# Patient Record
Sex: Female | Born: 1950
Health system: Southern US, Community
[De-identification: ages and names within clinical notes are randomized; demographics above are authoritative.]

## PROBLEM LIST (undated history)

## (undated) DIAGNOSIS — C9000 Multiple myeloma not having achieved remission: Secondary | ICD-10-CM

## (undated) DIAGNOSIS — M5126 Other intervertebral disc displacement, lumbar region: Secondary | ICD-10-CM

## (undated) DIAGNOSIS — R112 Nausea with vomiting, unspecified: Secondary | ICD-10-CM

## (undated) DIAGNOSIS — Z9889 Other specified postprocedural states: Secondary | ICD-10-CM

## (undated) DIAGNOSIS — E119 Type 2 diabetes mellitus without complications: Secondary | ICD-10-CM

## (undated) DIAGNOSIS — H409 Unspecified glaucoma: Secondary | ICD-10-CM

## (undated) DIAGNOSIS — Z7189 Other specified counseling: Secondary | ICD-10-CM

## (undated) HISTORY — DX: Other specified counseling: Z71.89

## (undated) HISTORY — PX: CHOLECYSTECTOMY: SHX55

## (undated) HISTORY — PX: EYE SURGERY: SHX253

## (undated) HISTORY — PX: TUBAL LIGATION: SHX77

## (undated) HISTORY — DX: Multiple myeloma not having achieved remission: C90.00

---

## 2002-11-15 ENCOUNTER — Other Ambulatory Visit: Admission: RE | Admit: 2002-11-15 | Discharge: 2002-11-15 | Payer: Self-pay | Admitting: Otolaryngology

## 2014-02-20 DIAGNOSIS — H5200 Hypermetropia, unspecified eye: Secondary | ICD-10-CM | POA: Diagnosis not present

## 2014-02-20 DIAGNOSIS — H353190 Nonexudative age-related macular degeneration, unspecified eye, stage unspecified: Secondary | ICD-10-CM | POA: Diagnosis not present

## 2014-02-20 DIAGNOSIS — H524 Presbyopia: Secondary | ICD-10-CM | POA: Diagnosis not present

## 2014-02-20 DIAGNOSIS — H52229 Regular astigmatism, unspecified eye: Secondary | ICD-10-CM | POA: Diagnosis not present

## 2014-03-06 DIAGNOSIS — IMO0001 Reserved for inherently not codable concepts without codable children: Secondary | ICD-10-CM | POA: Diagnosis not present

## 2014-03-06 DIAGNOSIS — E785 Hyperlipidemia, unspecified: Secondary | ICD-10-CM | POA: Diagnosis not present

## 2014-03-06 DIAGNOSIS — I10 Essential (primary) hypertension: Secondary | ICD-10-CM | POA: Diagnosis not present

## 2014-03-13 DIAGNOSIS — IMO0001 Reserved for inherently not codable concepts without codable children: Secondary | ICD-10-CM | POA: Diagnosis not present

## 2014-03-13 DIAGNOSIS — E669 Obesity, unspecified: Secondary | ICD-10-CM | POA: Diagnosis not present

## 2014-03-13 DIAGNOSIS — I10 Essential (primary) hypertension: Secondary | ICD-10-CM | POA: Diagnosis not present

## 2014-04-24 DIAGNOSIS — R0789 Other chest pain: Secondary | ICD-10-CM | POA: Diagnosis not present

## 2014-04-24 DIAGNOSIS — R05 Cough: Secondary | ICD-10-CM | POA: Diagnosis not present

## 2014-04-24 DIAGNOSIS — R059 Cough, unspecified: Secondary | ICD-10-CM | POA: Diagnosis not present

## 2014-04-24 DIAGNOSIS — J3489 Other specified disorders of nose and nasal sinuses: Secondary | ICD-10-CM | POA: Diagnosis not present

## 2014-05-01 DIAGNOSIS — IMO0001 Reserved for inherently not codable concepts without codable children: Secondary | ICD-10-CM | POA: Diagnosis not present

## 2014-05-29 DIAGNOSIS — I10 Essential (primary) hypertension: Secondary | ICD-10-CM | POA: Diagnosis not present

## 2014-05-29 DIAGNOSIS — IMO0001 Reserved for inherently not codable concepts without codable children: Secondary | ICD-10-CM | POA: Diagnosis not present

## 2014-05-29 DIAGNOSIS — E785 Hyperlipidemia, unspecified: Secondary | ICD-10-CM | POA: Diagnosis not present

## 2014-06-05 DIAGNOSIS — M545 Low back pain, unspecified: Secondary | ICD-10-CM | POA: Diagnosis not present

## 2014-06-05 DIAGNOSIS — I10 Essential (primary) hypertension: Secondary | ICD-10-CM | POA: Diagnosis not present

## 2014-06-05 DIAGNOSIS — M25559 Pain in unspecified hip: Secondary | ICD-10-CM | POA: Diagnosis not present

## 2014-06-05 DIAGNOSIS — E669 Obesity, unspecified: Secondary | ICD-10-CM | POA: Diagnosis not present

## 2014-06-05 DIAGNOSIS — IMO0001 Reserved for inherently not codable concepts without codable children: Secondary | ICD-10-CM | POA: Diagnosis not present

## 2014-06-05 DIAGNOSIS — M76899 Other specified enthesopathies of unspecified lower limb, excluding foot: Secondary | ICD-10-CM | POA: Diagnosis not present

## 2014-06-07 DIAGNOSIS — M79609 Pain in unspecified limb: Secondary | ICD-10-CM | POA: Diagnosis not present

## 2014-06-07 DIAGNOSIS — M25559 Pain in unspecified hip: Secondary | ICD-10-CM | POA: Diagnosis not present

## 2014-06-07 DIAGNOSIS — R262 Difficulty in walking, not elsewhere classified: Secondary | ICD-10-CM | POA: Diagnosis not present

## 2014-06-12 DIAGNOSIS — M25559 Pain in unspecified hip: Secondary | ICD-10-CM | POA: Diagnosis not present

## 2014-06-12 DIAGNOSIS — M79609 Pain in unspecified limb: Secondary | ICD-10-CM | POA: Diagnosis not present

## 2014-06-12 DIAGNOSIS — R262 Difficulty in walking, not elsewhere classified: Secondary | ICD-10-CM | POA: Diagnosis not present

## 2014-06-14 DIAGNOSIS — M79609 Pain in unspecified limb: Secondary | ICD-10-CM | POA: Diagnosis not present

## 2014-06-14 DIAGNOSIS — M25559 Pain in unspecified hip: Secondary | ICD-10-CM | POA: Diagnosis not present

## 2014-06-14 DIAGNOSIS — R262 Difficulty in walking, not elsewhere classified: Secondary | ICD-10-CM | POA: Diagnosis not present

## 2014-06-16 DIAGNOSIS — M79609 Pain in unspecified limb: Secondary | ICD-10-CM | POA: Diagnosis not present

## 2014-06-16 DIAGNOSIS — M25559 Pain in unspecified hip: Secondary | ICD-10-CM | POA: Diagnosis not present

## 2014-06-16 DIAGNOSIS — R262 Difficulty in walking, not elsewhere classified: Secondary | ICD-10-CM | POA: Diagnosis not present

## 2014-06-21 DIAGNOSIS — R262 Difficulty in walking, not elsewhere classified: Secondary | ICD-10-CM | POA: Diagnosis not present

## 2014-06-21 DIAGNOSIS — M25559 Pain in unspecified hip: Secondary | ICD-10-CM | POA: Diagnosis not present

## 2014-06-21 DIAGNOSIS — M79609 Pain in unspecified limb: Secondary | ICD-10-CM | POA: Diagnosis not present

## 2014-06-21 DIAGNOSIS — M5126 Other intervertebral disc displacement, lumbar region: Secondary | ICD-10-CM

## 2014-06-21 HISTORY — DX: Other intervertebral disc displacement, lumbar region: M51.26

## 2014-06-26 DIAGNOSIS — IMO0002 Reserved for concepts with insufficient information to code with codable children: Secondary | ICD-10-CM | POA: Diagnosis not present

## 2014-06-26 DIAGNOSIS — M545 Low back pain, unspecified: Secondary | ICD-10-CM | POA: Diagnosis not present

## 2014-06-28 DIAGNOSIS — Z87891 Personal history of nicotine dependence: Secondary | ICD-10-CM | POA: Diagnosis not present

## 2014-06-28 DIAGNOSIS — IMO0001 Reserved for inherently not codable concepts without codable children: Secondary | ICD-10-CM | POA: Diagnosis not present

## 2014-06-28 DIAGNOSIS — E785 Hyperlipidemia, unspecified: Secondary | ICD-10-CM | POA: Diagnosis not present

## 2014-06-28 DIAGNOSIS — I10 Essential (primary) hypertension: Secondary | ICD-10-CM | POA: Diagnosis not present

## 2014-06-29 DIAGNOSIS — R262 Difficulty in walking, not elsewhere classified: Secondary | ICD-10-CM | POA: Diagnosis not present

## 2014-06-29 DIAGNOSIS — M25559 Pain in unspecified hip: Secondary | ICD-10-CM | POA: Diagnosis not present

## 2014-06-29 DIAGNOSIS — M79609 Pain in unspecified limb: Secondary | ICD-10-CM | POA: Diagnosis not present

## 2014-07-02 ENCOUNTER — Emergency Department (HOSPITAL_COMMUNITY)
Admission: EM | Admit: 2014-07-02 | Discharge: 2014-07-02 | Disposition: A | Discharge status: Home / Self Care | Payer: Medicare Other | Attending: Emergency Medicine | Admitting: Emergency Medicine

## 2014-07-02 ENCOUNTER — Encounter (HOSPITAL_COMMUNITY): Payer: Self-pay | Admitting: Emergency Medicine

## 2014-07-02 DIAGNOSIS — M545 Low back pain, unspecified: Secondary | ICD-10-CM | POA: Diagnosis not present

## 2014-07-02 DIAGNOSIS — M5430 Sciatica, unspecified side: Secondary | ICD-10-CM | POA: Insufficient documentation

## 2014-07-02 DIAGNOSIS — E119 Type 2 diabetes mellitus without complications: Secondary | ICD-10-CM | POA: Diagnosis not present

## 2014-07-02 DIAGNOSIS — M5441 Lumbago with sciatica, right side: Secondary | ICD-10-CM

## 2014-07-02 DIAGNOSIS — F17200 Nicotine dependence, unspecified, uncomplicated: Secondary | ICD-10-CM | POA: Diagnosis not present

## 2014-07-02 DIAGNOSIS — M549 Dorsalgia, unspecified: Secondary | ICD-10-CM | POA: Diagnosis present

## 2014-07-02 HISTORY — DX: Type 2 diabetes mellitus without complications: E11.9

## 2014-07-02 MED ORDER — HYDROMORPHONE HCL PF 1 MG/ML IJ SOLN
0.5000 mg | Freq: Once | INTRAMUSCULAR | Status: AC
  Administered 2014-07-02: 0.5 mg via INTRAMUSCULAR
  Filled 2014-07-02: qty 1

## 2014-07-02 MED ORDER — METHOCARBAMOL 500 MG PO TABS: 500.0000 mg | ORAL_TABLET | Freq: Three times a day (TID) | ORAL | Status: DC

## 2014-07-02 MED ORDER — OXYCODONE-ACETAMINOPHEN 5-325 MG PO TABS: 1.0000 | ORAL_TABLET | ORAL | Status: DC | PRN

## 2014-07-02 MED ORDER — KETOROLAC TROMETHAMINE 60 MG/2ML IM SOLN
60.0000 mg | Freq: Once | INTRAMUSCULAR | Status: AC
  Administered 2014-07-02: 60 mg via INTRAMUSCULAR
  Filled 2014-07-02: qty 2

## 2014-07-02 MED ORDER — METHOCARBAMOL 500 MG PO TABS
500.0000 mg | ORAL_TABLET | Freq: Once | ORAL | Status: AC
  Administered 2014-07-02: 500 mg via ORAL
  Filled 2014-07-02: qty 1

## 2014-07-02 MED ORDER — ONDANSETRON 4 MG PO TBDP
4.0000 mg | ORAL_TABLET | Freq: Once | ORAL | Status: AC
  Administered 2014-07-02: 4 mg via ORAL
  Filled 2014-07-02: qty 1

## 2014-07-02 MED ORDER — HYDROMORPHONE HCL PF 1 MG/ML IJ SOLN
1.0000 mg | Freq: Once | INTRAMUSCULAR | Status: AC
  Administered 2014-07-02: 1 mg via INTRAVENOUS
  Filled 2014-07-02: qty 1

## 2014-07-02 NOTE — ED Notes (Signed)
Patient and patients family member state understanding of discharge instructions, prescription medications, and follow up care. Patient out to car via wheel chair, but ambulatory to vehicle at this time.

## 2014-07-02 NOTE — Discharge Instructions (Signed)
Back Pain, Adult Back pain is very common. The pain often gets better over time. The cause of back pain is usually not dangerous. Most people can learn to manage their back pain on their own.  HOME CARE   Stay active. Start with short walks on flat ground if you can. Try to walk farther each day.  Do not sit, drive, or stand in one place for more than 30 minutes. Do not stay in bed.  Do not avoid exercise or work. Activity can help your back heal faster.  Be careful when you bend or lift an object. Bend at your knees, keep the object close to you, and do not twist.  Sleep on a firm mattress. Lie on your side, and bend your knees. If you lie on your back, put a pillow under your knees.  Only take medicines as told by your doctor.  Put ice on the injured area.  Put ice in a plastic bag.  Place a towel between your skin and the bag.  Leave the ice on for 15-20 minutes, 03-04 times a day for the first 2 to 3 days. After that, you can switch between ice and heat packs.  Ask your doctor about back exercises or massage.  Avoid feeling anxious or stressed. Find good ways to deal with stress, such as exercise. GET HELP RIGHT AWAY IF:   Your pain does not go away with rest or medicine.  Your pain does not go away in 1 week.  You have new problems.  You do not feel well.  The pain spreads into your legs.  You cannot control when you poop (bowel movement) or pee (urinate).  Your arms or legs feel weak or lose feeling (numbness).  You feel sick to your stomach (nauseous) or throw up (vomit).  You have belly (abdominal) pain.  You feel like you may pass out (faint). MAKE SURE YOU:   Understand these instructions.  Will watch your condition.  Will get help right away if you are not doing well or get worse. Document Released: 05/26/2008 Document Revised: 03/01/2012 Document Reviewed: 04/28/2011 [facility] Patient Information 2015 [place], [place]. This information is not intended  to replace advice given to you by your health care provider. Make sure you discuss any questions you have with your health care provider.  Sciatica Sciatica is pain, weakness, numbness, or tingling along your sciatic nerve. The nerve starts in the lower back and runs down the back of each leg. Nerve damage or certain conditions pinch or put pressure on the sciatic nerve. This causes the pain, weakness, and other discomforts of sciatica. HOME CARE   Only take medicine as told by your doctor.  Apply ice to the affected area for 20 minutes. Do this 3-4 times a day for the first 48-72 hours. Then try heat in the same way.  Exercise, stretch, or do your usual activities if these do not make your pain worse.  Go to physical therapy as told by your doctor.  Keep all doctor visits as told.  Do not wear high heels or shoes that are not supportive.  Get a firm mattress if your mattress is too soft to lessen pain and discomfort. GET HELP RIGHT AWAY IF:   You cannot control when you poop (bowel movement) or pee (urinate).  You have more weakness in your lower back, lower belly (pelvis), butt (buttocks), or legs.  You have redness or puffiness (swelling) of your back.  You have a burning feeling  when you pee.  You have pain that gets worse when you lie down.  You have pain that wakes you from your sleep.  Your pain is worse than past pain.  Your pain lasts longer than 4 weeks.  You are suddenly losing weight without reason. MAKE SURE YOU:   Understand these instructions.  Will watch this condition.  Will get help right away if you are not doing well or get worse. Document Released: 09/16/2008 Document Revised: 06/08/2012 Document Reviewed: 04/18/2012 [facility] Patient Information 2015 [place], [place]. This information is not intended to replace advice given to you by your health care provider. Make sure you discuss any questions you have with your health care provider.

## 2014-07-02 NOTE — ED Notes (Signed)
Ongoing, intermittent lumbar pain w/burning pain radiating into R buttock.  Pain is fairly constant and patient has been in PT x 2 weeks w/out relief.  Has been on Lyrica x 1 week w/out relief.  States her orthopediist stated if that didn't work she would need MRI. Today, patient bent over to take something out of oven and felt  "twinge" in R lower back.  Pain has become severe, to point that she cannot lie down or turn from side to side. She has had similar problem intermittently x 2 years.  Was on Prednisone once before which did not help.

## 2014-07-02 NOTE — ED Notes (Addendum)
Patient c/o lower back pain that radiates into right leg. Per patient having physical therapy x2 weeks for back pain with no relief-became severe today. Denies any incontinence problems.

## 2014-07-02 NOTE — ED Provider Notes (Signed)
CSN: [id number]     Arrival date & time 07/02/14  1557 History   None  This chart was scribed for non-physician practitioner Kem Parkinson, PA-C working with Maudry Diego, MD, by Thea Alken, ED Scribe. This patient was seen in room APFT24/APFT24 and the patient's care was started at 5:47 PM. Chief Complaint  Patient presents with  . Back Pain   Patient is a 63 y.o. female presenting with back pain. The history is provided by the patient. No language interpreter was used.  Back Pain Associated symptoms: no abdominal pain, no dysuria, no fever, no numbness and no weakness      Vicki Moon is a 63 y.o. female who presents to the Emergency Department complaining of constant burning, throbbing and aching lower back pain that radiates to right leg. Pt states she felt a "tinge" in lower back after bending forward while cooking about 7 hours ago. Pt reports her pain is unchanged since this morning. She reports pain radiates down entire leg with intermittent paresthesia to right foot. States she has tried both ice and heat compresses without relief as well as two 5mg  hydrocodone. She denies light headedness and dizziness after taking medication. Pt states she has been seen by orthopedist who believes this is sciatic pain. States she tried physical therapy 2 weeks ago without relief to pain. Pt was prescribed lyrica for 1 week ago by her orthopedist. She reports orthopedist will schedule an MRI if there are no improvements with lyrica. Pt has h/o DM and takes 2 metformin in morning and night and 20 units of insulin at night.  Pt denies abdominal pain. Pt denies bladder and bowel incontinence or dysuria.  This is a recurrent problem   Past Medical History  Diagnosis Date  . Diabetes mellitus without complication    Past Surgical History  Procedure Laterality Date  . Cholecystectomy     Family History  Problem Relation Age of Onset  . Hypertension Mother   . Stroke Mother   . Cancer Father    . Diabetes Father    History  Substance Use Topics  . Smoking status: Current Every Day Smoker -- 5.00 packs/day for 10 years    Types: Cigarettes  . Smokeless tobacco: Never Used  . Alcohol Use: No   OB History   Grav Para Term Preterm Abortions TAB SAB Ect Mult Living                 Review of Systems  Constitutional: Negative for fever.  Respiratory: Negative for shortness of breath.   Gastrointestinal: Negative for nausea, vomiting, abdominal pain, constipation, blood in stool and abdominal distention.  Genitourinary: Negative for dysuria, urgency, frequency, hematuria, flank pain, decreased urine volume, difficulty urinating and vaginal pain.       No perineal numbness or incontinence of urine or feces  Musculoskeletal: Positive for back pain, gait problem and myalgias. Negative for joint swelling.  Skin: Negative for rash.  Neurological: Negative for dizziness, weakness, light-headedness and numbness.  All other systems reviewed and are negative.  Allergies  Review of patient's allergies indicates no known allergies.  Home Medications   Prior to Admission medications   Not on File   BP 130/63  Pulse 72  Temp(Src) 97.6 F (36.4 C) (Oral)  Resp 20  Ht 5\' 2"  (1.575 m)  Wt 176 lb (79.833 kg)  BMI 32.18 kg/m2  SpO2 97% Physical Exam  Nursing note and vitals reviewed. Constitutional: She is oriented to person, place,  and time. She appears well-developed and well-nourished. No distress.  HENT:  Head: Normocephalic and atraumatic.  Eyes: Conjunctivae and EOM are normal.  Neck: Normal range of motion. Neck supple.  Cardiovascular: Normal rate, regular rhythm, normal heart sounds and intact distal pulses.   No murmur heard. Pulmonary/Chest: Effort normal and breath sounds normal. No respiratory distress.  Abdominal: Soft. She exhibits no distension. There is no tenderness.  Musculoskeletal: Normal range of motion. She exhibits tenderness. She exhibits no edema.        Lumbar back: She exhibits tenderness and pain. She exhibits normal range of motion, no swelling, no deformity, no laceration and normal pulse.  TTP right lumbar paraspinal muscle and SI joint pain reproduced with straight leg raises on the right. DP pulse brisk distal sensation intact. Compartment of the right leg are soft.  Neurological: She is alert and oriented to person, place, and time. She has normal strength. No sensory deficit. She exhibits normal muscle tone. Coordination and gait normal.  Reflex Scores:      Patellar reflexes are 2+ on the right side and 2+ on the left side.      Achilles reflexes are 2+ on the right side and 2+ on the left side. Skin: Skin is warm and dry. No rash noted.  Psychiatric: She has a normal mood and affect. Her behavior is normal.    ED Course  Procedures  DIAGNOSTIC STUDIES: Oxygen Saturation is 97% on RA, normal by my interpretation.    COORDINATION OF CARE: 6:48 PM- pain is improving but pain still appears to be uncomfortable will try addition IM medication.   Labs Review Labs Reviewed - No data to display  Imaging Review No results found.   EKG Interpretation None      MDM   Final diagnoses:  Right-sided low back pain with right-sided sciatica    Previous   Pt with recurrent back pain and what is believed to be an exacerbation of her chronic pain.  No concerning sx's for emergent neurological or infectious process although sx's are suspicious for HNP  Pain relieved after medications in the dept.  Pt also seen by Dr. Roderic Palau. And care plan discussed.  Pt agrees to f/u with her orthopedic physician on Monday.  Pain has improved and she appears stable for d/c  I personally performed the services described in this documentation, which was scribed in my presence. The recorded information has been reviewed and is accurate.      Quentavious Rittenhouse L. Edmar Blankenburg, PA-C 07/04/14 1709

## 2014-07-03 ENCOUNTER — Other Ambulatory Visit (HOSPITAL_COMMUNITY): Payer: Self-pay | Admitting: Sports Medicine

## 2014-07-03 DIAGNOSIS — M5441 Lumbago with sciatica, right side: Secondary | ICD-10-CM

## 2014-07-04 ENCOUNTER — Ambulatory Visit (HOSPITAL_COMMUNITY)
Admission: RE | Admit: 2014-07-04 | Discharge: 2014-07-04 | Disposition: A | Discharge status: Home / Self Care | Payer: Medicare Other | Source: Ambulatory Visit | Attending: Sports Medicine | Admitting: Sports Medicine

## 2014-07-04 DIAGNOSIS — R29898 Other symptoms and signs involving the musculoskeletal system: Secondary | ICD-10-CM | POA: Diagnosis not present

## 2014-07-04 DIAGNOSIS — M5124 Other intervertebral disc displacement, thoracic region: Secondary | ICD-10-CM | POA: Insufficient documentation

## 2014-07-04 DIAGNOSIS — M5126 Other intervertebral disc displacement, lumbar region: Secondary | ICD-10-CM | POA: Diagnosis not present

## 2014-07-04 DIAGNOSIS — R209 Unspecified disturbances of skin sensation: Secondary | ICD-10-CM | POA: Diagnosis not present

## 2014-07-04 DIAGNOSIS — M47817 Spondylosis without myelopathy or radiculopathy, lumbosacral region: Secondary | ICD-10-CM | POA: Insufficient documentation

## 2014-07-04 DIAGNOSIS — M545 Low back pain, unspecified: Secondary | ICD-10-CM | POA: Insufficient documentation

## 2014-07-04 DIAGNOSIS — M79609 Pain in unspecified limb: Secondary | ICD-10-CM | POA: Diagnosis not present

## 2014-07-04 DIAGNOSIS — M5441 Lumbago with sciatica, right side: Secondary | ICD-10-CM

## 2014-07-05 NOTE — ED Provider Notes (Signed)
Medical screening examination/treatment/procedure(s) were performed by non-physician practitioner and as supervising physician I was immediately available for consultation/collaboration.   EKG Interpretation None        Maudry Diego, MD 07/05/14 [phone]

## 2014-07-06 ENCOUNTER — Other Ambulatory Visit (HOSPITAL_COMMUNITY): Payer: Self-pay | Admitting: Orthopaedic Surgery

## 2014-07-06 DIAGNOSIS — M5126 Other intervertebral disc displacement, lumbar region: Secondary | ICD-10-CM | POA: Diagnosis not present

## 2014-07-10 MED FILL — Oxycodone w/ Acetaminophen Tab 5-325 MG: ORAL | Qty: 6 | Status: AC

## 2014-07-11 ENCOUNTER — Encounter (HOSPITAL_COMMUNITY)
Admission: RE | Admit: 2014-07-11 | Discharge: 2014-07-11 | Disposition: A | Discharge status: Home / Self Care | Payer: Medicare Other | Source: Ambulatory Visit | Attending: Orthopaedic Surgery | Admitting: Orthopaedic Surgery

## 2014-07-11 ENCOUNTER — Encounter (HOSPITAL_COMMUNITY): Payer: Self-pay

## 2014-07-11 DIAGNOSIS — Z01810 Encounter for preprocedural cardiovascular examination: Secondary | ICD-10-CM | POA: Diagnosis not present

## 2014-07-11 DIAGNOSIS — H409 Unspecified glaucoma: Secondary | ICD-10-CM | POA: Diagnosis not present

## 2014-07-11 DIAGNOSIS — F17200 Nicotine dependence, unspecified, uncomplicated: Secondary | ICD-10-CM | POA: Diagnosis not present

## 2014-07-11 DIAGNOSIS — Z01818 Encounter for other preprocedural examination: Secondary | ICD-10-CM | POA: Diagnosis not present

## 2014-07-11 DIAGNOSIS — M5126 Other intervertebral disc displacement, lumbar region: Secondary | ICD-10-CM | POA: Diagnosis not present

## 2014-07-11 DIAGNOSIS — Z6832 Body mass index (BMI) 32.0-32.9, adult: Secondary | ICD-10-CM | POA: Diagnosis not present

## 2014-07-11 DIAGNOSIS — Z794 Long term (current) use of insulin: Secondary | ICD-10-CM | POA: Diagnosis not present

## 2014-07-11 DIAGNOSIS — Z01812 Encounter for preprocedural laboratory examination: Secondary | ICD-10-CM | POA: Diagnosis not present

## 2014-07-11 DIAGNOSIS — E119 Type 2 diabetes mellitus without complications: Secondary | ICD-10-CM | POA: Diagnosis not present

## 2014-07-11 DIAGNOSIS — Q762 Congenital spondylolisthesis: Secondary | ICD-10-CM | POA: Diagnosis not present

## 2014-07-11 DIAGNOSIS — E669 Obesity, unspecified: Secondary | ICD-10-CM | POA: Diagnosis not present

## 2014-07-11 HISTORY — DX: Unspecified glaucoma: H40.9

## 2014-07-11 HISTORY — DX: Other specified postprocedural states: Z98.890

## 2014-07-11 HISTORY — DX: Other specified postprocedural states: R11.2

## 2014-07-11 HISTORY — DX: Other intervertebral disc displacement, lumbar region: M51.26

## 2014-07-11 LAB — CBC
HCT: 40.5 % (ref 36.0–46.0)
HEMOGLOBIN: 13.6 g/dL (ref 12.0–15.0)
MCH: 30.8 pg (ref 26.0–34.0)
MCHC: 33.6 g/dL (ref 30.0–36.0)
MCV: 91.8 fL (ref 78.0–100.0)
PLATELETS: 281 10*3/uL (ref 150–400)
RBC: 4.41 MIL/uL (ref 3.87–5.11)
RDW: 15 % (ref 11.5–15.5)
WBC: 10.9 10*3/uL — ABNORMAL HIGH (ref 4.0–10.5)

## 2014-07-11 LAB — COMPREHENSIVE METABOLIC PANEL
ALT: 18 U/L (ref 0–35)
AST: 14 U/L (ref 0–37)
Albumin: 3.8 g/dL (ref 3.5–5.2)
Alkaline Phosphatase: 113 U/L (ref 39–117)
Anion gap: 16 — ABNORMAL HIGH (ref 5–15)
BUN: 9 mg/dL (ref 6–23)
CO2: 25 meq/L (ref 19–32)
Calcium: 9.3 mg/dL (ref 8.4–10.5)
Chloride: 101 mEq/L (ref 96–112)
Creatinine, Ser: 0.59 mg/dL (ref 0.50–1.10)
GFR calc non Af Amer: >90 mL/min (ref >90)
Glucose, Bld: 152 mg/dL — ABNORMAL HIGH (ref 70–99)
Potassium: 3.9 mEq/L (ref 3.7–5.3)
SODIUM: 142 meq/L (ref 137–147)
TOTAL PROTEIN: 7.3 g/dL (ref 6.0–8.3)
Total Bilirubin: 0.2 mg/dL — ABNORMAL LOW (ref 0.3–1.2)

## 2014-07-11 LAB — URINALYSIS, ROUTINE W REFLEX MICROSCOPIC
Bilirubin Urine: NEGATIVE
GLUCOSE, UA: NEGATIVE mg/dL
HGB URINE DIPSTICK: NEGATIVE
Ketones, ur: NEGATIVE mg/dL
Leukocytes, UA: NEGATIVE
Nitrite: NEGATIVE
Protein, ur: NEGATIVE mg/dL
SPECIFIC GRAVITY, URINE: 1.017 (ref 1.005–1.030)
Urobilinogen, UA: 0.2 mg/dL (ref 0.0–1.0)
pH: 5.5 (ref 5.0–8.0)

## 2014-07-11 LAB — SURGICAL PCR SCREEN
MRSA, PCR: NEGATIVE
STAPHYLOCOCCUS AUREUS: NEGATIVE

## 2014-07-11 MED ORDER — CEFAZOLIN SODIUM-DEXTROSE 2-3 GM-% IV SOLR
2.0000 g | INTRAVENOUS | Status: AC
  Administered 2014-07-12: 2 g via INTRAVENOUS
  Filled 2014-07-11: qty 50

## 2014-07-11 NOTE — Pre-Procedure Instructions (Signed)
Vicki Moon  07/11/2014   Your procedure is scheduled on:  Wednesday, July 22  Report to [facility] Admitting at 1:00 PM.  Call this number if you have problems the morning of surgery: [phone]   Remember:   Do not eat food or drink liquids after midnight.Tuesday night   Take these medicines the morning of surgery with A SIP OF WATER: methocarbamol, Oxycodone     Do not wear jewelry,make up or nail polish.  Do not wear lotions, powders, or perfumes. You may not wear deodorant.the day of surgery  Do not shave 48 hours prior to surgery.    Do not bring valuables to the hospital.  [facility] is not responsible  for any belongings or valuables.               Contacts, dentures or bridgework may not be worn into surgery.  Leave suitcase in the car. After surgery it may be brought to your room.  For patients admitted to the hospital, discharge time is determined by your treatment team.               Patients discharged the day of surgery will not be allowed to drive home.  Name and phone number of your driver:        Special Instructions: [place] - Preparing for Surgery  Before surgery, you can play an important role.  Because skin is not sterile, your skin needs to be as free of germs as possible.  You can reduce the number of germs on you skin by washing with CHG (chlorahexidine gluconate) soap before surgery.  CHG is an antiseptic cleaner which kills germs and bonds with the skin to continue killing germs even after washing.  Please DO NOT use if you have an allergy to CHG or antibacterial soaps.  If your skin becomes reddened/irritated stop using the CHG and inform your nurse when you arrive at Short Stay.  Do not shave (including legs and underarms) for at least 48 hours prior to the first CHG shower.  You may shave your face.  Please follow these instructions carefully:   1.  Shower with CHG Soap the night before surgery and the morning of  Surgery.  2.  If you choose to wash your hair, wash your hair first as usual with your   normal shampoo.  3.  After you shampoo, rinse your hair and body thoroughly to remove the  Shampoo.  4.  Use CHG as you would any other liquid soap.  You can apply chg directly   to the skin and wash gently with scrungie or a clean washcloth.  5.  Apply the CHG Soap to your body ONLY FROM THE NECK DOWN.   Do not use on open wounds or open sores.  Avoid contact with your eyes,  ears, mouth and genitals (private parts).  Wash genitals (private parts)  with your normal soap.  6.  Wash thoroughly, paying special attention to the area where your surgery   will be performed.  7.  Thoroughly rinse your body with warm water from the neck down.  8.  DO NOT shower/wash with your normal soap after using and rinsing off   the CHG Soap.  9.  Pat yourself dry with a clean towel.            10.  Wear clean pajamas.            11.  Place clean sheets  on your bed the night of your first shower and do not  sleep with pets.  Day of Surgery  Do not apply any lotions/deoderants the morning of surgery.  Please wear clean clothes to the hospital/surgery center.     Please read over the following fact sheets that you were given: Pain Booklet, Coughing and Deep Breathing and Surgical Site Infection Prevention

## 2014-07-11 NOTE — H&P (Signed)
Vicki Moon is an 63 y.o. female.   Chief Complaint: back pain and right buttock and leg pain HPI: Pt with several weeks of progressive back pain right leg pain.  No acute injury.  Initially treated with NSAIDS and pain meds.  Over the past 2 weeks the right leg has gotten weak and she has difficulty walking.  Now with numbness and tingling in the right lateral thigh that extends down to her foot.  Physical therapy attempted without any relief of symptoms.  MRI scan with large disc extrusion with probable free fragment on the right at L5-S1.  Compression of the right S1 nerve root noted.  Pt wishes to proceed with surgical intervention which has been recommended.  Microdiscectomy at right L5-S1 will be performed by Dr Lorin Mercy.  Past Medical History  Diagnosis Date  . Diabetes mellitus without complication     Past Surgical History  Procedure Laterality Date  . Cholecystectomy      Family History  Problem Relation Age of Onset  . Hypertension Mother   . Stroke Mother   . Cancer Father   . Diabetes Father    Social History:  reports that she has been smoking Cigarettes.  She has a 50 pack-year smoking history. She has never used smokeless tobacco. She reports that she does not drink alcohol or use illicit drugs.  Allergies: No Known Allergies  No prescriptions prior to admission    No results found for this or any previous visit (from the past 48 hour(s)). No results found.  Review of Systems  Musculoskeletal: Positive for back pain.  Neurological: Positive for tingling and focal weakness.       Right leg  All other systems reviewed and are negative.   There were no vitals taken for this visit. Physical Exam  Constitutional: She is oriented to person, place, and time. She appears well-developed and well-nourished.  HENT:  Head: Normocephalic and atraumatic.  Eyes: EOM are normal. Pupils are equal, round, and reactive to light.  Neck: Normal range of motion.   Cardiovascular: Normal rate.   Respiratory: Effort normal.  GI: Soft.  Musculoskeletal:  Pain with ROM LS spine.  +SLR right .  Weakness with plantarflexion of right foot.  Right achilles reflex 5-/5.  Tight with right hamstring.    Neurological: She is alert and oriented to person, place, and time.  Skin: Skin is warm and dry.  Psychiatric: She has a normal mood and affect.     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  PLAN:  Right L5-S1 microdiscectomy  Raygen Dahm M 07/11/2014, 1:20 PM

## 2014-07-12 ENCOUNTER — Encounter (HOSPITAL_COMMUNITY): Payer: Self-pay | Admitting: Anesthesiology

## 2014-07-12 ENCOUNTER — Encounter (HOSPITAL_COMMUNITY)
Admission: RE | Disposition: A | Discharge status: Home / Self Care | Payer: Self-pay | Source: Ambulatory Visit | Attending: Orthopaedic Surgery

## 2014-07-12 ENCOUNTER — Ambulatory Visit (HOSPITAL_COMMUNITY): Payer: Medicare Other | Admitting: Anesthesiology

## 2014-07-12 ENCOUNTER — Encounter (HOSPITAL_COMMUNITY): Payer: Medicare Other | Admitting: Anesthesiology

## 2014-07-12 ENCOUNTER — Observation Stay (HOSPITAL_COMMUNITY)
Admission: RE | Admit: 2014-07-12 | Discharge: 2014-07-13 | Disposition: A | Discharge status: Home / Self Care | Payer: Medicare Other | Source: Ambulatory Visit | Attending: Orthopaedic Surgery | Admitting: Orthopaedic Surgery

## 2014-07-12 ENCOUNTER — Ambulatory Visit (HOSPITAL_COMMUNITY): Payer: Medicare Other

## 2014-07-12 DIAGNOSIS — Z01812 Encounter for preprocedural laboratory examination: Secondary | ICD-10-CM | POA: Insufficient documentation

## 2014-07-12 DIAGNOSIS — H409 Unspecified glaucoma: Secondary | ICD-10-CM | POA: Insufficient documentation

## 2014-07-12 DIAGNOSIS — Z01810 Encounter for preprocedural cardiovascular examination: Secondary | ICD-10-CM | POA: Insufficient documentation

## 2014-07-12 DIAGNOSIS — M5126 Other intervertebral disc displacement, lumbar region: Secondary | ICD-10-CM | POA: Diagnosis not present

## 2014-07-12 DIAGNOSIS — Q762 Congenital spondylolisthesis: Secondary | ICD-10-CM | POA: Insufficient documentation

## 2014-07-12 DIAGNOSIS — Z6832 Body mass index (BMI) 32.0-32.9, adult: Secondary | ICD-10-CM | POA: Insufficient documentation

## 2014-07-12 DIAGNOSIS — Z794 Long term (current) use of insulin: Secondary | ICD-10-CM | POA: Insufficient documentation

## 2014-07-12 DIAGNOSIS — Z01818 Encounter for other preprocedural examination: Secondary | ICD-10-CM | POA: Diagnosis not present

## 2014-07-12 DIAGNOSIS — F17200 Nicotine dependence, unspecified, uncomplicated: Secondary | ICD-10-CM | POA: Insufficient documentation

## 2014-07-12 DIAGNOSIS — M519 Unspecified thoracic, thoracolumbar and lumbosacral intervertebral disc disorder: Secondary | ICD-10-CM | POA: Diagnosis not present

## 2014-07-12 DIAGNOSIS — E119 Type 2 diabetes mellitus without complications: Secondary | ICD-10-CM | POA: Insufficient documentation

## 2014-07-12 DIAGNOSIS — E669 Obesity, unspecified: Secondary | ICD-10-CM | POA: Insufficient documentation

## 2014-07-12 HISTORY — PX: LUMBAR LAMINECTOMY: SHX95

## 2014-07-12 LAB — GLUCOSE, CAPILLARY
Glucose-Capillary: 122 mg/dL — ABNORMAL HIGH (ref 70–99)
Glucose-Capillary: 190 mg/dL — ABNORMAL HIGH (ref 70–99)
Glucose-Capillary: 247 mg/dL — ABNORMAL HIGH (ref 70–99)

## 2014-07-12 SURGERY — MICRODISCECTOMY LUMBAR LAMINECTOMY
Anesthesia: General | Site: Back | Laterality: Right

## 2014-07-12 MED ORDER — METFORMIN HCL 500 MG PO TABS
1000.0000 mg | ORAL_TABLET | Freq: Two times a day (BID) | ORAL | Status: DC
  Administered 2014-07-12 – 2014-07-13 (×2): 1000 mg via ORAL
  Filled 2014-07-12 (×5): qty 2

## 2014-07-12 MED ORDER — DOCUSATE SODIUM 100 MG PO CAPS
100.0000 mg | ORAL_CAPSULE | Freq: Two times a day (BID) | ORAL | Status: DC
  Administered 2014-07-12: 100 mg via ORAL
  Filled 2014-07-12 (×3): qty 1

## 2014-07-12 MED ORDER — POLYETHYLENE GLYCOL 3350 17 G PO PACK: 17.0000 g | PACK | Freq: Every day | ORAL | Status: DC | PRN

## 2014-07-12 MED ORDER — LIDOCAINE HCL (CARDIAC) 20 MG/ML IV SOLN
INTRAVENOUS | Status: DC | PRN
  Administered 2014-07-12: 80 mg via INTRAVENOUS

## 2014-07-12 MED ORDER — LACTATED RINGERS IV SOLN
INTRAVENOUS | Status: DC | PRN
  Administered 2014-07-12: 14:00:00 via INTRAVENOUS

## 2014-07-12 MED ORDER — MORPHINE SULFATE 2 MG/ML IJ SOLN: 1.0000 mg | INTRAMUSCULAR | Status: DC | PRN

## 2014-07-12 MED ORDER — LACTATED RINGERS IV SOLN
INTRAVENOUS | Status: DC
  Administered 2014-07-12: 13:00:00 via INTRAVENOUS

## 2014-07-12 MED ORDER — NEOSTIGMINE METHYLSULFATE 10 MG/10ML IV SOLN
INTRAVENOUS | Status: AC
  Filled 2014-07-12: qty 1

## 2014-07-12 MED ORDER — POTASSIUM CHLORIDE IN NACL 20-0.45 MEQ/L-% IV SOLN
INTRAVENOUS | Status: DC
  Administered 2014-07-12: 21:00:00 via INTRAVENOUS
  Filled 2014-07-12 (×3): qty 1000

## 2014-07-12 MED ORDER — ONDANSETRON HCL 4 MG/2ML IJ SOLN
INTRAMUSCULAR | Status: AC
Start: 2014-07-12 — End: 2014-07-12
  Filled 2014-07-12: qty 2

## 2014-07-12 MED ORDER — MIDAZOLAM HCL 2 MG/2ML IJ SOLN
INTRAMUSCULAR | Status: AC
  Filled 2014-07-12: qty 2

## 2014-07-12 MED ORDER — METHOCARBAMOL 500 MG PO TABS: 500.0000 mg | ORAL_TABLET | Freq: Four times a day (QID) | ORAL | Status: DC | PRN

## 2014-07-12 MED ORDER — INSULIN DETEMIR 100 UNIT/ML ~~LOC~~ SOLN
20.0000 [IU] | Freq: Every day | SUBCUTANEOUS | Status: DC
  Administered 2014-07-12: 20 [IU] via SUBCUTANEOUS
  Filled 2014-07-12 (×2): qty 0.2

## 2014-07-12 MED ORDER — FLEET ENEMA 7-19 GM/118ML RE ENEM: 1.0000 | ENEMA | Freq: Once | RECTAL | Status: AC | PRN

## 2014-07-12 MED ORDER — ONDANSETRON HCL 4 MG/2ML IJ SOLN
INTRAMUSCULAR | Status: DC | PRN
  Administered 2014-07-12: 4 mg via INTRAVENOUS

## 2014-07-12 MED ORDER — PROPOFOL 10 MG/ML IV BOLUS
INTRAVENOUS | Status: DC | PRN
  Administered 2014-07-12: 160 mg via INTRAVENOUS

## 2014-07-12 MED ORDER — SODIUM CHLORIDE 0.9 % IJ SOLN: 3.0000 mL | INTRAMUSCULAR | Status: DC | PRN

## 2014-07-12 MED ORDER — ROCURONIUM BROMIDE 100 MG/10ML IV SOLN
INTRAVENOUS | Status: DC | PRN
  Administered 2014-07-12: 50 mg via INTRAVENOUS

## 2014-07-12 MED ORDER — HYDROMORPHONE HCL PF 1 MG/ML IJ SOLN
0.2500 mg | INTRAMUSCULAR | Status: DC | PRN
Start: 2014-07-12 — End: 2014-07-12
  Administered 2014-07-12: 0.5 mg via INTRAVENOUS

## 2014-07-12 MED ORDER — ARTIFICIAL TEARS OP OINT
TOPICAL_OINTMENT | OPHTHALMIC | Status: DC | PRN
  Administered 2014-07-12: 1 via OPHTHALMIC

## 2014-07-12 MED ORDER — ARTIFICIAL TEARS OP OINT
TOPICAL_OINTMENT | OPHTHALMIC | Status: AC
  Filled 2014-07-12: qty 3.5

## 2014-07-12 MED ORDER — OXYCODONE HCL 5 MG/5ML PO SOLN: 5.0000 mg | Freq: Once | ORAL | Status: DC | PRN

## 2014-07-12 MED ORDER — 0.9 % SODIUM CHLORIDE (POUR BTL) OPTIME
TOPICAL | Status: DC | PRN
  Administered 2014-07-12: 1000 mL

## 2014-07-12 MED ORDER — CEFAZOLIN SODIUM 1-5 GM-% IV SOLN
1.0000 g | Freq: Three times a day (TID) | INTRAVENOUS | Status: AC
  Administered 2014-07-12 – 2014-07-13 (×2): 1 g via INTRAVENOUS
  Filled 2014-07-12 (×2): qty 50

## 2014-07-12 MED ORDER — PANTOPRAZOLE SODIUM 40 MG IV SOLR
40.0000 mg | Freq: Every day | INTRAVENOUS | Status: DC
  Filled 2014-07-12: qty 40

## 2014-07-12 MED ORDER — ZOLPIDEM TARTRATE 5 MG PO TABS: 5.0000 mg | ORAL_TABLET | Freq: Every evening | ORAL | Status: DC | PRN

## 2014-07-12 MED ORDER — BUPIVACAINE HCL (PF) 0.25 % IJ SOLN
INTRAMUSCULAR | Status: AC
Start: 2014-07-12 — End: 2014-07-12
  Filled 2014-07-12: qty 30

## 2014-07-12 MED ORDER — SODIUM CHLORIDE 0.9 % IV SOLN: 250.0000 mL | INTRAVENOUS | Status: DC

## 2014-07-12 MED ORDER — INSULIN ASPART 100 UNIT/ML ~~LOC~~ SOLN
0.0000 [IU] | Freq: Three times a day (TID) | SUBCUTANEOUS | Status: DC
  Administered 2014-07-12 – 2014-07-13 (×2): 3 [IU] via SUBCUTANEOUS

## 2014-07-12 MED ORDER — DEXAMETHASONE SODIUM PHOSPHATE 4 MG/ML IJ SOLN
INTRAMUSCULAR | Status: DC | PRN
  Administered 2014-07-12: 4 mg via INTRAVENOUS

## 2014-07-12 MED ORDER — ROCURONIUM BROMIDE 50 MG/5ML IV SOLN
INTRAVENOUS | Status: AC
Start: 2014-07-12 — End: 2014-07-12
  Filled 2014-07-12: qty 1

## 2014-07-12 MED ORDER — OXYCODONE-ACETAMINOPHEN 5-325 MG PO TABS: 1.0000 | ORAL_TABLET | ORAL | Status: DC | PRN

## 2014-07-12 MED ORDER — ACETAMINOPHEN 650 MG RE SUPP: 650.0000 mg | RECTAL | Status: DC | PRN

## 2014-07-12 MED ORDER — LIDOCAINE HCL (CARDIAC) 20 MG/ML IV SOLN
INTRAVENOUS | Status: AC
Start: 2014-07-12 — End: 2014-07-12
  Filled 2014-07-12: qty 5

## 2014-07-12 MED ORDER — METHOCARBAMOL 1000 MG/10ML IJ SOLN
500.0000 mg | Freq: Four times a day (QID) | INTRAVENOUS | Status: DC | PRN
  Filled 2014-07-12: qty 5

## 2014-07-12 MED ORDER — SCOPOLAMINE 1 MG/3DAYS TD PT72
1.0000 | MEDICATED_PATCH | TRANSDERMAL | Status: DC
  Administered 2014-07-12: 1.5 mg via TRANSDERMAL

## 2014-07-12 MED ORDER — ONDANSETRON HCL 4 MG/2ML IJ SOLN: 4.0000 mg | INTRAMUSCULAR | Status: DC | PRN

## 2014-07-12 MED ORDER — PANTOPRAZOLE SODIUM 40 MG PO TBEC
40.0000 mg | DELAYED_RELEASE_TABLET | Freq: Every day | ORAL | Status: DC
  Administered 2014-07-12: 40 mg via ORAL
  Filled 2014-07-12: qty 1

## 2014-07-12 MED ORDER — MENTHOL 3 MG MT LOZG: 1.0000 | LOZENGE | OROMUCOSAL | Status: DC | PRN

## 2014-07-12 MED ORDER — INSULIN ASPART 100 UNIT/ML ~~LOC~~ SOLN: 0.0000 [IU] | Freq: Three times a day (TID) | SUBCUTANEOUS | Status: DC

## 2014-07-12 MED ORDER — HYDROCODONE-ACETAMINOPHEN 5-325 MG PO TABS: 1.0000 | ORAL_TABLET | ORAL | Status: DC | PRN

## 2014-07-12 MED ORDER — METOCLOPRAMIDE HCL 5 MG/ML IJ SOLN
INTRAMUSCULAR | Status: DC | PRN
  Administered 2014-07-12: 10 mg via INTRAVENOUS

## 2014-07-12 MED ORDER — LINAGLIPTIN 5 MG PO TABS
5.0000 mg | ORAL_TABLET | Freq: Every day | ORAL | Status: DC
  Filled 2014-07-12: qty 1

## 2014-07-12 MED ORDER — FENTANYL CITRATE 0.05 MG/ML IJ SOLN
INTRAMUSCULAR | Status: DC | PRN
  Administered 2014-07-12: 100 ug via INTRAVENOUS
  Administered 2014-07-12: 50 ug via INTRAVENOUS

## 2014-07-12 MED ORDER — OXYCODONE HCL 5 MG PO TABS: 5.0000 mg | ORAL_TABLET | Freq: Once | ORAL | Status: DC | PRN

## 2014-07-12 MED ORDER — SODIUM CHLORIDE 0.9 % IJ SOLN: 3.0000 mL | Freq: Two times a day (BID) | INTRAMUSCULAR | Status: DC

## 2014-07-12 MED ORDER — SCOPOLAMINE 1 MG/3DAYS TD PT72
MEDICATED_PATCH | TRANSDERMAL | Status: AC
  Filled 2014-07-12: qty 1

## 2014-07-12 MED ORDER — GLYCOPYRROLATE 0.2 MG/ML IJ SOLN
INTRAMUSCULAR | Status: DC | PRN
  Administered 2014-07-12: 0.6 mg via INTRAVENOUS

## 2014-07-12 MED ORDER — ACETAMINOPHEN 325 MG PO TABS: 650.0000 mg | ORAL_TABLET | ORAL | Status: DC | PRN

## 2014-07-12 MED ORDER — ONDANSETRON HCL 4 MG/2ML IJ SOLN: 4.0000 mg | Freq: Four times a day (QID) | INTRAMUSCULAR | Status: DC | PRN

## 2014-07-12 MED ORDER — LISINOPRIL 10 MG PO TABS
10.0000 mg | ORAL_TABLET | Freq: Every day | ORAL | Status: DC
  Filled 2014-07-12: qty 1

## 2014-07-12 MED ORDER — KETOROLAC TROMETHAMINE 30 MG/ML IJ SOLN
30.0000 mg | Freq: Once | INTRAMUSCULAR | Status: AC
  Administered 2014-07-12: 30 mg via INTRAVENOUS
  Filled 2014-07-12: qty 1

## 2014-07-12 MED ORDER — HYDROMORPHONE HCL PF 1 MG/ML IJ SOLN
INTRAMUSCULAR | Status: AC
  Filled 2014-07-12: qty 1

## 2014-07-12 MED ORDER — NEOSTIGMINE METHYLSULFATE 10 MG/10ML IV SOLN
INTRAVENOUS | Status: DC | PRN
  Administered 2014-07-12: 4 mg via INTRAVENOUS

## 2014-07-12 MED ORDER — PROPOFOL 10 MG/ML IV BOLUS
INTRAVENOUS | Status: AC
  Filled 2014-07-12: qty 20

## 2014-07-12 MED ORDER — FENTANYL CITRATE 0.05 MG/ML IJ SOLN
INTRAMUSCULAR | Status: AC
  Filled 2014-07-12: qty 5

## 2014-07-12 MED ORDER — BUPIVACAINE HCL (PF) 0.25 % IJ SOLN
INTRAMUSCULAR | Status: DC | PRN
  Administered 2014-07-12: 10 mL

## 2014-07-12 MED ORDER — PHENOL 1.4 % MT LIQD: 1.0000 | OROMUCOSAL | Status: DC | PRN

## 2014-07-12 MED ORDER — GLYCOPYRROLATE 0.2 MG/ML IJ SOLN
INTRAMUSCULAR | Status: AC
  Filled 2014-07-12: qty 3

## 2014-07-12 MED ORDER — LATANOPROST 0.005 % OP SOLN
1.0000 [drp] | Freq: Every day | OPHTHALMIC | Status: DC
  Administered 2014-07-12: 1 [drp] via OPHTHALMIC
  Filled 2014-07-12: qty 2.5

## 2014-07-12 MED ORDER — ASPIRIN EC 81 MG PO TBEC
81.0000 mg | DELAYED_RELEASE_TABLET | Freq: Every day | ORAL | Status: DC
  Filled 2014-07-12: qty 1

## 2014-07-12 SURGICAL SUPPLY — 47 items
BUR ROUND FLUTED 4 SOFT TCH (BURR) ×2 IMPLANT
CORDS BIPOLAR (ELECTRODE) ×2 IMPLANT
COVER SURGICAL LIGHT HANDLE (MISCELLANEOUS) ×2 IMPLANT
DERMABOND ADVANCED (GAUZE/BANDAGES/DRESSINGS)
DERMABOND ADVANCED .7 DNX12 (GAUZE/BANDAGES/DRESSINGS) IMPLANT
DRAPE MICROSCOPE LEICA (MISCELLANEOUS) ×2 IMPLANT
DRAPE PROXIMA HALF (DRAPES) ×4 IMPLANT
DRSG EMULSION OIL 3X3 NADH (GAUZE/BANDAGES/DRESSINGS) IMPLANT
DRSG MEPILEX BORDER 4X4 (GAUZE/BANDAGES/DRESSINGS) ×2 IMPLANT
DRSG MEPILEX BORDER 4X8 (GAUZE/BANDAGES/DRESSINGS) IMPLANT
DURAPREP 26ML APPLICATOR (WOUND CARE) ×2 IMPLANT
ELECT REM PT RETURN 9FT ADLT (ELECTROSURGICAL) ×2
ELECTRODE REM PT RTRN 9FT ADLT (ELECTROSURGICAL) ×1 IMPLANT
GLOVE BIO SURGEON STRL SZ7.5 (GLOVE) ×2 IMPLANT
GLOVE BIOGEL PI IND STRL 7.0 (GLOVE) ×1 IMPLANT
GLOVE BIOGEL PI IND STRL 7.5 (GLOVE) ×1 IMPLANT
GLOVE BIOGEL PI IND STRL 8 (GLOVE) ×1 IMPLANT
GLOVE BIOGEL PI INDICATOR 7.0 (GLOVE) ×1
GLOVE BIOGEL PI INDICATOR 7.5 (GLOVE) ×1
GLOVE BIOGEL PI INDICATOR 8 (GLOVE) ×1
GLOVE ECLIPSE 7.0 STRL STRAW (GLOVE) ×4 IMPLANT
GLOVE ORTHO TXT STRL SZ7.5 (GLOVE) ×2 IMPLANT
GLOVE SURG SS PI 7.0 STRL IVOR (GLOVE) ×6 IMPLANT
GOWN STRL REUS W/ TWL LRG LVL3 (GOWN DISPOSABLE) ×3 IMPLANT
GOWN STRL REUS W/TWL LRG LVL3 (GOWN DISPOSABLE) ×3
KIT BASIN OR (CUSTOM PROCEDURE TRAY) ×2 IMPLANT
KIT ROOM TURNOVER OR (KITS) ×2 IMPLANT
NDL SUT .5 MAYO 1.404X.05X (NEEDLE) ×1 IMPLANT
NEEDLE 22X1 1/2 (OR ONLY) (NEEDLE) ×2 IMPLANT
NEEDLE MAYO TAPER (NEEDLE) ×1
NEEDLE SPNL 18GX3.5 QUINCKE PK (NEEDLE) IMPLANT
NS IRRIG 1000ML POUR BTL (IV SOLUTION) ×2 IMPLANT
PACK LAMINECTOMY ORTHO (CUSTOM PROCEDURE TRAY) ×2 IMPLANT
PAD ARMBOARD 7.5X6 YLW CONV (MISCELLANEOUS) ×4 IMPLANT
PATTIES SURGICAL .5 X.5 (GAUZE/BANDAGES/DRESSINGS) ×2 IMPLANT
PATTIES SURGICAL .75X.75 (GAUZE/BANDAGES/DRESSINGS) IMPLANT
SPONGE GAUZE 4X4 12PLY (GAUZE/BANDAGES/DRESSINGS) IMPLANT
SUT VIC AB 2-0 CT1 27 (SUTURE) ×1
SUT VIC AB 2-0 CT1 TAPERPNT 27 (SUTURE) ×1 IMPLANT
SUT VICRYL 0 TIES 12 18 (SUTURE) ×2 IMPLANT
SUT VICRYL 4-0 PS2 18IN ABS (SUTURE) IMPLANT
SUT VICRYL AB 2 0 TIES (SUTURE) ×2 IMPLANT
SYR 20ML ECCENTRIC (SYRINGE) IMPLANT
SYR CONTROL 10ML LL (SYRINGE) ×2 IMPLANT
TOWEL OR 17X24 6PK STRL BLUE (TOWEL DISPOSABLE) ×2 IMPLANT
TOWEL OR 17X26 10 PK STRL BLUE (TOWEL DISPOSABLE) ×2 IMPLANT
WATER STERILE IRR 1000ML POUR (IV SOLUTION) IMPLANT

## 2014-07-12 NOTE — Anesthesia Postprocedure Evaluation (Signed)
Anesthesia Post Note  Patient: Vicki Moon  Procedure(s) Performed: Procedure(s) (LRB): LUMBAR FIVE TO SACRAL ONE MICRODISCECTOMY (Right)  Anesthesia type: general  Patient location: PACU  Post pain: Pain level controlled  Post assessment: Patient's Cardiovascular Status Stable  Last Vitals:  Filed Vitals:   07/12/14 1701  BP: 144/64  Pulse: 67  Temp:   Resp: 15    Post vital signs: Reviewed and stable  Level of consciousness: sedated  Complications: No apparent anesthesia complications

## 2014-07-12 NOTE — Brief Op Note (Cosign Needed)
07/12/2014  3:59 PM  PATIENT:  Vicki Moon  63 y.o. female  PRE-OPERATIVE DIAGNOSIS:  RIGHT L5-S1 HERNIATED NUCLEUS PULPOSUS WITH FREE FRAGMENT  POST-OPERATIVE DIAGNOSIS:  RIGHT L5-S1 HERNIATED NUCLEUS PULPOSUS WITH  FREE FRAGMENT  PROCEDURE:  Procedure(s): LUMBAR FIVE TO SACRAL ONE MICRODISCECTOMY (Right) Excision of free fragment SURGEON:  Surgeon(s) and Role:    * Marybelle Killings, MD - Primary  PHYSICIAN ASSISTANT: Phillips Hay PAC  ASSISTANTS: none   ANESTHESIA:   general  EBL:   50CC  BLOOD ADMINISTERED:none  DRAINS: none   LOCAL MEDICATIONS USED:  MARCAINE     SPECIMEN:  No Specimen  DISPOSITION OF SPECIMEN:  N/A  COUNTS:  YES  TOURNIQUET:  * No tourniquets in log *  DICTATION: .Note written in EPIC  PLAN OF CARE: Admit for overnight observation  PATIENT DISPOSITION:  PACU - hemodynamically stable.   Delay start of Pharmacological VTE agent (>24hrs) due to surgical blood loss or risk of bleeding: yes

## 2014-07-12 NOTE — Transfer of Care (Signed)
Immediate Anesthesia Transfer of Care Note  Patient: Vicki Moon  Procedure(s) Performed: Procedure(s): LUMBAR FIVE TO SACRAL ONE MICRODISCECTOMY (Right)  Patient Location: PACU  Anesthesia Type:General  Level of Consciousness: awake, alert , oriented and sedated  Airway & Oxygen Therapy: Patient Spontanous Breathing and Patient connected to nasal cannula oxygen  Post-op Assessment: Report given to PACU RN, Post -op Vital signs reviewed and stable and Patient moving all extremities  Post vital signs: Reviewed and stable  Complications: No apparent anesthesia complications

## 2014-07-12 NOTE — Anesthesia Procedure Notes (Signed)
Procedure Name: Intubation Date/Time: 07/12/2014 2:45 PM Performed by: Scheryl Darter Pre-anesthesia Checklist: Patient identified, Emergency Drugs available, Suction available, Timeout performed and Patient being monitored Patient Re-evaluated:Patient Re-evaluated prior to inductionOxygen Delivery Method: Circle system utilized Preoxygenation: Pre-oxygenation with 100% oxygen Intubation Type: IV induction Ventilation: Mask ventilation without difficulty Laryngoscope Size: Miller and 2 Grade View: Grade I Tube type: Oral Tube size: 7.5 mm Number of attempts: 1 Airway Equipment and Method: Stylet Placement Confirmation: ETT inserted through vocal cords under direct vision,  positive ETCO2 and breath sounds checked- equal and bilateral Secured at: 22 cm Tube secured with: Tape Dental Injury: Teeth and Oropharynx as per pre-operative assessment

## 2014-07-12 NOTE — Discharge Instructions (Signed)
No lifting greater than 10 lbs. Avoid bending, stooping and twisting. Walk in house for first week them may start to get out slowly increasing distance up to one mile by 3 weeks post op. Keep incision dry for 3 days, may use tegaderm or similar water impervious dressing. Ice packs to wounds daily or as needed for pain and swelling

## 2014-07-12 NOTE — Anesthesia Preprocedure Evaluation (Signed)
Anesthesia Evaluation  Patient identified by MRN, date of birth, ID band Patient awake    Reviewed: Allergy & Precautions, H&P , NPO status , Patient's Chart, lab work & pertinent test results  History of Anesthesia Complications (+) PONV  Airway Mallampati: II  Neck ROM: full    Dental   Pulmonary Current Smoker,          Cardiovascular negative cardio ROS      Neuro/Psych    GI/Hepatic   Endo/Other  diabetes, Type 2obese  Renal/GU      Musculoskeletal   Abdominal   Peds  Hematology   Anesthesia Other Findings   Reproductive/Obstetrics                           Anesthesia Physical Anesthesia Plan  ASA: II  Anesthesia Plan: General   Post-op Pain Management:    Induction: Intravenous  Airway Management Planned: Oral ETT  Additional Equipment:   Intra-op Plan:   Post-operative Plan: Extubation in OR  Informed Consent: I have reviewed the patients History and Physical, chart, labs and discussed the procedure including the risks, benefits and alternatives for the proposed anesthesia with the patient or authorized representative who has indicated his/her understanding and acceptance.     Plan Discussed with: CRNA, Anesthesiologist and Surgeon  Anesthesia Plan Comments:         Anesthesia Quick Evaluation

## 2014-07-12 NOTE — Interval H&P Note (Signed)
History and Physical Interval Note:  07/12/2014 1:57 PM  Vicki Moon  has presented today for surgery, with the diagnosis of Right L5-S1 herniated nucleus pulposus free fragment  The various methods of treatment have been discussed with the patient and family. After consideration of risks, benefits and other options for treatment, the patient has consented to  Procedure(s): RIGHT L5-S1 MICRODISCECTOMY (N/A) as a surgical intervention .  The patient's history has been reviewed, patient examined, no change in status, stable for surgery.  I have reviewed the patient's chart and labs.  Questions were answered to the patient's satisfaction.     Michayla Mcneil C

## 2014-07-13 ENCOUNTER — Encounter (HOSPITAL_COMMUNITY): Payer: Self-pay | Admitting: Orthopaedic Surgery

## 2014-07-13 DIAGNOSIS — M5126 Other intervertebral disc displacement, lumbar region: Secondary | ICD-10-CM | POA: Diagnosis not present

## 2014-07-13 LAB — GLUCOSE, CAPILLARY: GLUCOSE-CAPILLARY: 152 mg/dL — AB (ref 70–99)

## 2014-07-13 NOTE — Progress Notes (Signed)
Subjective: 1 Day Post-Op Procedure(s) (LRB): LUMBAR FIVE TO SACRAL ONE MICRODISCECTOMY (Right) Patient reports pain as mild.  Already tells a difference from surgery.  Denies bil LE weakness. Has been up in her room.  Normal UOP.  Objective: Vital signs in last 24 hours: Temp:  [97.7 F (36.5 C)-98.5 F (36.9 C)] 97.9 F (36.6 C) (07/23 0445) Pulse Rate:  [59-80] 76 (07/23 0445) Resp:  [9-20] 18 (07/23 0445) BP: (91-144)/(35-79) 94/41 mmHg (07/23 0445) SpO2:  [92 %-100 %] 100 % (07/23 0445) Weight:  [79.379 kg (175 lb)] 79.379 kg (175 lb) (07/22 2158)  Intake/Output from previous day: 07/22 0701 - 07/23 0700 In: 900 [I.V.:900] Out: 25 [Blood:25] Intake/Output this shift:     Recent Labs  07/11/14 1441  HGB 13.6    Recent Labs  07/11/14 1441  WBC 10.9*  RBC 4.41  HCT 40.5  PLT 281    Recent Labs  07/11/14 1441  NA 142  K 3.9  CL 101  CO2 25  BUN 9  CREATININE 0.59  GLUCOSE 152*  CALCIUM 9.3   No results found for this basename: LABPT, INR,  in the last 72 hours  Sensation intact distally Intact pulses distally Dorsiflexion/Plantar flexion intact Incision: dressing C/D/I  Assessment/Plan: 1 Day Post-Op Procedure(s) (LRB): LUMBAR FIVE TO SACRAL ONE MICRODISCECTOMY (Right) Discharge to home today.  Tinsley Lomas Y 07/13/2014, 8:57 AM

## 2014-07-13 NOTE — Op Note (Signed)
Vicki Moon, Vicki Moon            ACCOUNT NO.:  [id number]  MEDICAL RECORD NO.:  [id number]  LOCATION:  [id number]                        FACILITY:  [facility]  PHYSICIAN:  Kynzley Dowson C. Lorin Mercy, M.D.    DATE OF BIRTH:  27-Aug-1951  DATE OF PROCEDURE:  07/12/2014 DATE OF DISCHARGE:                              OPERATIVE REPORT   PREOPERATIVE DIAGNOSIS:  Right L5-S1 HNP radiculopathy and free fragment.  POSTOPERATIVE DIAGNOSIS:  Right L5-S1 HNP radiculopathy and free fragment.  PROCEDURE:  Right L5-S1 microdiskectomy and removal of free fragment.  SURGEON:  Georga Stys C. Lorin Mercy, M.D.  ASSISTANT:  Epimenio Foot, PA-C; medically necessary and present for the entire procedure.  ANESTHESIA:  ______GOT____ plus Marcaine skin local.  ESTIMATED BLOOD LOSS:  Minimal.  COMPLICATIONS:  None.  DESCRIPTION OF PROCEDURE:  After induction general anesthesia, the patient was placed prone, pads underneath the ulnar nerve, arms were at 90:90, rolled yellow foam underneath the shoulders.  Pads under the knees.  Back was prepped with DuraPrep.  Ancef was given prophylactically.  Time-out procedure completed.  Area scored with towels, sterile skin marker, have been used for the planned documentation of the appropriate level and Betadine, Steri-Drape applied.  Laminectomy sheets and drapes were applied.  Midline incision was made at ____L5-S1 based of palpable land marks and pre-op x-ryas______ and subperiosteal dissection.  Metal probe was placed down to inferior aspect of L5, confirmed with radiographic cross- table lateral.  Laminotomy was performed.  Chunks of ligament were removed and medially large disk herniation was seen running up the right lateral gutter at the level of the disk.  Chunks of ligament were removed.  Nerve root was carefully retracted, which was dorsally displaced and continued removal of chunks of free fragment of disk from underneath the nerve root.  In the midline, there was some  annular bulge.  There was slight anterolisthesis of an L5 slightly 2 mm in front of S1.  Chunks of disk removed using the _pituitary_________ teasing it from underneath the ligament for removal of disk fragments until the disk was flap.  There was good decompression of the nerve root up and down straight pituitaries, micro-pituitaries, and regular pituitaries were used.  __________ Barbra Sarks and hockey stick.  Bone was removed out to the level of the pedicle.  Nerve root was free.  Area was irrigated and some epidural veins were coagulated with bipolar.  A #1 Vicryl used for fascia closure, 2-0 Vicryl for subcuticular closure with Vicryl and Dermabond, skin, postop dressing, and transferred to the recovery room.  Instrument count and needle count was correct.     Atleigh Gruen C. Lorin Mercy, M.D.     MCY/MEDQ  D:  07/12/2014  T:  07/13/2014  Job:  [id number]

## 2014-07-13 NOTE — Discharge Summary (Signed)
Patient ID: Vicki Moon MRN: [id number] DOB/AGE: October 31, 1951 63 y.o.  Admit date: 07/12/2014 Discharge date: 07/13/2014  Admission Diagnoses:  Principal Problem:   HNP (herniated nucleus pulposus), lumbar   Discharge Diagnoses:  Same  Past Medical History  Diagnosis Date  . PONV (postoperative nausea and vomiting)   . Diabetes mellitus without complication     Type 2 IDDM x 5 years  . Herniated lumbar intervertebral disc 06/2014  . Glaucoma     Surgeries: Procedure(s): LUMBAR FIVE TO SACRAL ONE MICRODISCECTOMY on 07/12/2014   Consultants:    Discharged Condition: Improved  Hospital Course: Vicki Moon is an 63 y.o. female who was admitted 07/12/2014 for operative treatment ofHNP (herniated nucleus pulposus), lumbar. Patient has severe unremitting pain that affects sleep, daily activities, and work/hobbies. After pre-op clearance the patient was taken to the operating room on 07/12/2014 and underwent  Procedure(s): LUMBAR FIVE TO SACRAL ONE MICRODISCECTOMY.    Patient was given perioperative antibiotics: Anti-infectives   Start     Dose/Rate Route Frequency Ordered Stop   07/12/14 2300  ceFAZolin (ANCEF) IVPB 1 g/50 mL premix     1 g 100 mL/hr over 30 Minutes Intravenous Every 8 hours 07/12/14 1843 07/13/14 0736   07/12/14 0600  ceFAZolin (ANCEF) IVPB 2 g/50 mL premix     2 g 100 mL/hr over 30 Minutes Intravenous On call to O.R. 07/11/14 1421 07/12/14 1451       Patient was given sequential compression devices, early ambulation, and chemoprophylaxis to prevent DVT.  Patient benefited maximally from hospital stay and there were no complications.    Recent vital signs: Patient Vitals for the past 24 hrs:  BP Temp Temp src Pulse Resp SpO2 Height Weight  07/13/14 0445 94/41 mmHg 97.9 F (36.6 C) Oral 76 18 100 % - -  07/12/14 2158 91/35 mmHg 98 F (36.7 C) - 59 17 - 5\' 2"  (1.575 m) 79.379 kg (175 lb)  07/12/14 1835 122/60 mmHg 98.5 F (36.9 C) Oral 75 16 94  % - -  07/12/14 1815 118/62 mmHg 98 F (36.7 C) - 63 14 96 % - -  07/12/14 1800 115/57 mmHg - - 68 13 97 % - -  07/12/14 1745 122/59 mmHg - - 65 14 98 % - -  07/12/14 1730 127/61 mmHg - - 68 14 97 % - -  07/12/14 1715 134/60 mmHg - - 71 20 98 % - -  07/12/14 1701 144/64 mmHg - - 67 15 98 % - -  07/12/14 1700 - - - 67 13 98 % - -  07/12/14 1648 115/79 mmHg - - 77 17 100 % - -  07/12/14 1645 - - - 72 17 98 % - -  07/12/14 1640 135/69 mmHg - - 68 15 97 % - -  07/12/14 1630 - - - 75 15 98 % - -  07/12/14 1625 126/60 mmHg - - 73 17 97 % - -  07/12/14 1615 - - - 72 9 96 % - -  07/12/14 1608 136/67 mmHg 97.7 F (36.5 C) - 80 14 92 % - -  07/12/14 1328 134/51 mmHg 97.8 F (36.6 C) Oral 66 18 98 % - 79.379 kg (175 lb)     Recent laboratory studies:  Recent Labs  07/11/14 1441  WBC 10.9*  HGB 13.6  HCT 40.5  PLT 281  NA 142  K 3.9  CL 101  CO2 25  BUN 9  CREATININE 0.59  GLUCOSE 152*  CALCIUM 9.3     Discharge Medications:     Medication List         aspirin EC 81 MG tablet  Take 81 mg by mouth daily.     insulin detemir 100 UNIT/ML injection  Commonly known as:  LEVEMIR  Inject 20 Units into the skin at bedtime.     latanoprost 0.005 % ophthalmic solution  Commonly known as:  XALATAN  Place 1 drop into both eyes at bedtime.     linagliptin 5 MG Tabs tablet  Commonly known as:  TRADJENTA  Take 5 mg by mouth daily.     lisinopril 10 MG tablet  Commonly known as:  PRINIVIL,ZESTRIL  Take 10 mg by mouth daily.     metFORMIN 500 MG tablet  Commonly known as:  GLUCOPHAGE  Take 1,000 mg by mouth 2 (two) times daily with a meal.     methocarbamol 500 MG tablet  Commonly known as:  ROBAXIN  Take 500 mg by mouth every 6 (six) hours as needed for muscle spasms.     methocarbamol 500 MG tablet  Commonly known as:  ROBAXIN  Take 1 tablet (500 mg total) by mouth every 6 (six) hours as needed for muscle spasms (spasm).     oxyCODONE-acetaminophen 5-325 MG per tablet   Commonly known as:  PERCOCET/ROXICET  Take 1 tablet by mouth every 4 (four) hours as needed for moderate pain.     oxyCODONE-acetaminophen 5-325 MG per tablet  Commonly known as:  ROXICET  Take 1 tablet by mouth every 4 (four) hours as needed.        Diagnostic Studies: Dg Chest 2 View  07/11/2014   CLINICAL DATA:  63 year old female preoperative study for spine surgery. Diabetes. Initial encounter.  EXAM: CHEST  2 VIEW  COMPARISON:  None.  FINDINGS: Right upper quadrant surgical clips. Lung volumes are within normal limits. Normal cardiac size and mediastinal contours. Visualized tracheal air column is within normal limits. No pneumothorax, pulmonary edema, pleural effusion or confluent pulmonary opacity. No acute osseous abnormality identified.  IMPRESSION: No acute cardiopulmonary abnormality.   Electronically Signed   By: Lars Pinks M.D.   On: 07/11/2014 15:25   Mr Lumbar Spine Wo Contrast  07/04/2014   CLINICAL DATA:  Intense RIGHT leg pain with weakness and numbness for the past few months.  EXAM: MRI LUMBAR SPINE WITHOUT CONTRAST  TECHNIQUE: Multiplanar, multisequence MR imaging of the lumbar spine was performed. No intravenous contrast was administered.  COMPARISON:  None.  FINDINGS: Anatomic alignment. Five lumbar type vertebral bodies are assumed. Slight disc space narrowing and desiccation L5-S1. Normal conus. No worrisome osseous lesions. Extra-spinal soft tissues unremarkable.  The individual disc spaces were examined with axial images as follows:  T12-L1:  RIGHT paracentral protrusion.  No impingement.  L1-L2:  Mild bulge.  Mild facet arthropathy.  No impingement.  L2-L3: Mild bulge. Mild facet and ligamentum flavum hypertrophy. No impingement.  L3-L4:  Mild bulge.  Posterior element hypertrophy.  No impingement.  L4-L5: Normal-appearing disc space. Mild facet and ligamentum flavum hypertrophy. No impingement.  L5-S1: There is a extruded disc herniation on the RIGHT, with probable free  fragment. Mild facet arthropathy. Significant RIGHT S1 nerve root impingement. No foraminal narrowing or L5 nerve root compression  IMPRESSION: The dominant right-sided abnormality is at L5-S1 where a large disc extrusion with probable free fragment is present on the RIGHT. RIGHT S1 nerve root compression is observed.  Smaller RIGHT paracentral protrusion at T12-L1, noncompressive  Minor lower lumbar facet arthropathy is observed at multiple levels.   Electronically Signed   By: Rolla Flatten M.D.   On: 07/04/2014 12:33   Dg Lumbar Spine 1 View  07/12/2014   CLINICAL DATA:  Right L5-S1 micro diskectomy  EXAM: LUMBAR SPINE - 1 VIEW  COMPARISON:  07/04/2014  FINDINGS: Intraoperative localization probe is direct for the pedicles at the L5 level.  IMPRESSION: Intraoperative localization   Electronically Signed   By: Skipper Cliche M.D.   On: 07/12/2014 15:49    Disposition: 01-Home or Self Care      Discharge Instructions   Call MD / Call 911    Complete by:  As directed   If you experience chest pain or shortness of breath, CALL 911 and be transported to the hospital emergency room.  If you develope a fever above 101 F, pus (white drainage) or increased drainage or redness at the wound, or calf pain, call your surgeon's office.     Call MD / Call 911    Complete by:  As directed   If you experience chest pain or shortness of breath, CALL 911 and be transported to the hospital emergency room.  If you develope a fever above 101 F, pus (white drainage) or increased drainage or redness at the wound, or calf pain, call your surgeon's office.     Constipation Prevention    Complete by:  As directed   Drink plenty of fluids.  Prune juice may be helpful.  You may use a stool softener, such as Colace (over the counter) 100 mg twice a day.  Use MiraLax (over the counter) for constipation as needed.     Constipation Prevention    Complete by:  As directed   Drink plenty of fluids.  Prune juice may be helpful.   You may use a stool softener, such as Colace (over the counter) 100 mg twice a day.  Use MiraLax (over the counter) for constipation as needed.     Diet - low sodium heart healthy    Complete by:  As directed      Diet - low sodium heart healthy    Complete by:  As directed      Discharge instructions    Complete by:  As directed   No lifting greater than 10 lbs. Avoid bending, stooping and twisting. Walk in house for first week them may start to get out slowly increasing distance up to one mile by 3 weeks post op. Keep incision dry for 3 days, may use tegaderm or similar water impervious dressing.     Discharge patient    Complete by:  As directed      Increase activity slowly as tolerated    Complete by:  As directed      Increase activity slowly as tolerated    Complete by:  As directed      Lifting restrictions    Complete by:  As directed   No lifting for 6 weeks           Follow-up Information   Follow up with Marybelle Killings, MD. Schedule an appointment as soon as possible for a visit in 2 weeks.   Specialty:  Orthopedic Surgery   Contact information:   [place] [address] [phone]        Signed: Mcarthur Rossetti 07/13/2014, 8:59 AM

## 2014-07-13 NOTE — Plan of Care (Signed)
Problem: Consults Goal: Diagnosis - Spinal Surgery Microdiscectomy

## 2014-08-29 DIAGNOSIS — E785 Hyperlipidemia, unspecified: Secondary | ICD-10-CM | POA: Diagnosis not present

## 2014-08-29 DIAGNOSIS — IMO0001 Reserved for inherently not codable concepts without codable children: Secondary | ICD-10-CM | POA: Diagnosis not present

## 2014-08-29 DIAGNOSIS — I10 Essential (primary) hypertension: Secondary | ICD-10-CM | POA: Diagnosis not present

## 2014-09-06 DIAGNOSIS — E669 Obesity, unspecified: Secondary | ICD-10-CM | POA: Diagnosis not present

## 2014-09-06 DIAGNOSIS — I10 Essential (primary) hypertension: Secondary | ICD-10-CM | POA: Diagnosis not present

## 2014-09-06 DIAGNOSIS — Z87891 Personal history of nicotine dependence: Secondary | ICD-10-CM | POA: Diagnosis not present

## 2014-09-06 DIAGNOSIS — IMO0001 Reserved for inherently not codable concepts without codable children: Secondary | ICD-10-CM | POA: Diagnosis not present

## 2014-09-07 ENCOUNTER — Other Ambulatory Visit (HOSPITAL_COMMUNITY): Payer: Self-pay | Admitting: Orthopaedic Surgery

## 2014-09-07 DIAGNOSIS — M545 Low back pain, unspecified: Secondary | ICD-10-CM

## 2014-09-12 ENCOUNTER — Ambulatory Visit (HOSPITAL_COMMUNITY)
Admission: RE | Admit: 2014-09-12 | Discharge: 2014-09-12 | Disposition: A | Discharge status: Home / Self Care | Payer: Medicare Other | Source: Ambulatory Visit | Attending: Orthopaedic Surgery | Admitting: Orthopaedic Surgery

## 2014-09-12 DIAGNOSIS — M51379 Other intervertebral disc degeneration, lumbosacral region without mention of lumbar back pain or lower extremity pain: Secondary | ICD-10-CM | POA: Diagnosis not present

## 2014-09-12 DIAGNOSIS — M545 Low back pain, unspecified: Secondary | ICD-10-CM | POA: Insufficient documentation

## 2014-09-12 DIAGNOSIS — M79604 Pain in right leg: Secondary | ICD-10-CM

## 2014-09-12 DIAGNOSIS — M5137 Other intervertebral disc degeneration, lumbosacral region: Secondary | ICD-10-CM | POA: Diagnosis not present

## 2014-09-12 DIAGNOSIS — M79609 Pain in unspecified limb: Secondary | ICD-10-CM | POA: Diagnosis not present

## 2014-09-12 DIAGNOSIS — M47817 Spondylosis without myelopathy or radiculopathy, lumbosacral region: Secondary | ICD-10-CM | POA: Diagnosis not present

## 2014-09-12 LAB — POCT I-STAT CREATININE: Creatinine, Ser: 0.6 mg/dL (ref 0.50–1.10)

## 2014-09-12 MED ORDER — SODIUM CHLORIDE 0.9 % IJ SOLN
INTRAMUSCULAR | Status: AC
  Filled 2014-09-12: qty 15

## 2014-09-12 MED ORDER — GADOBENATE DIMEGLUMINE 529 MG/ML IV SOLN
15.0000 mL | Freq: Once | INTRAVENOUS | Status: AC | PRN
  Administered 2014-09-12: 15 mL via INTRAVENOUS

## 2014-09-19 DIAGNOSIS — Z23 Encounter for immunization: Secondary | ICD-10-CM | POA: Diagnosis not present

## 2014-09-26 DIAGNOSIS — M961 Postlaminectomy syndrome, not elsewhere classified: Secondary | ICD-10-CM | POA: Diagnosis not present

## 2014-09-26 DIAGNOSIS — M5126 Other intervertebral disc displacement, lumbar region: Secondary | ICD-10-CM | POA: Diagnosis not present

## 2014-09-26 DIAGNOSIS — M5116 Intervertebral disc disorders with radiculopathy, lumbar region: Secondary | ICD-10-CM | POA: Diagnosis not present

## 2014-09-28 ENCOUNTER — Encounter: Payer: Medicare Other | Admitting: Nutrition

## 2014-09-28 ENCOUNTER — Ambulatory Visit: Payer: Medicare Other | Admitting: Nutrition

## 2014-09-28 VITALS — Ht 62.5 in | Wt 177.0 lb

## 2014-09-28 VITALS — Ht 62.0 in | Wt 173.8 lb

## 2014-09-28 DIAGNOSIS — IMO0002 Reserved for concepts with insufficient information to code with codable children: Secondary | ICD-10-CM

## 2014-09-28 DIAGNOSIS — E1165 Type 2 diabetes mellitus with hyperglycemia: Secondary | ICD-10-CM

## 2014-09-28 NOTE — Progress Notes (Signed)
  Medical Nutrition Therapy:  Appt start time: 1030 end time:  1130.   Assessment:  Primary concerns today: Uncontrolled DM. Last A1C was 9%; up from 7.5%.  She does the shopping and cooking. Forgets to test blood sugars in the evenings. FBS 130-200's. Had recent back surgery which limits her physical activity. Desires weight loss and improved blood sugar control   Preferred Learning Style:     No preference indicated   Learning Readiness:   Ready  Change in progress   MEDICATIONS: see list.    DIETARY INTAKE:  24-hr recall:  B ( AM): Raisin bran and 2% milk  Snk ( AM): fruit  L ( PM): Chicken salad, water Snk ( PM): popcorn D ( PM): meat, vegetables, coffee sometimes Snk ( PM): fruit, yogurt or oatmeal creme debbie cake Beverages: water, coffee  Usual physical activity: limited to back surgery  Estimated energy needs: 1200 calories 135 g carbohydrates 90 g protein 33 g fat  Progress Towards Goal(s):  In progress.   Nutritional Diagnosis:  NB-1.1 Food and nutrition-related knowledge deficit As related to Diabetes.  As evidenced by A1C >/= 9%..    Intervention:  Nutrition counseling and diabetes education..  Plan:  Aim for 2-3 Carb Choices per meal (30-45 grams) +/- 1 either way  Aim for 0-1 Carbs per snack if hungry  Follow the Plate Method Include protein in moderation with your meals and snacks Consider reading food labels for Total Carbohydrate and Fat Grams of foods Consider  increasing your activity level by 15 for 30 minutes daily as tolerated Be sure to checking BG at alternate times per day as directed by MD  Continue taking medication  as directed by MD Goal: Lose 1 lb per week.2. Measure foods out. 3.Get A1C down to 7% in three months.  Teaching Method Utilized:  Visual Auditory Hands on  Handouts given during visit include: Carb Counting and Food Label handouts Meal Plan Card  My Plate  Barriers to learning/adherence to lifestyle change:  recent back surgery will limit her physical activity  Demonstrated degree of understanding via:  Teach Back   Monitoring/Evaluation:  Dietary intake, exercise, meal planning, SBG, and body weight 1 month.Marland Kitchen

## 2014-09-28 NOTE — Progress Notes (Signed)
  Medical Nutrition Therapy:  Appt start time: 1030 end time:  1130.   Assessment:  Primary concerns today: Diabetes. She had back surgery a few months ago and having trouble with pain and mobility. Has diarrhea often 6" times per day. Takes meds as prescribed. BG 113- 50's before meals. BS after meals 160-200's. Had cortisone shot recently and BS went up. Tries to eat three meals per day. Forgets to test sometimes at night. LIves with husband and she  does most shopping and cooking. Wants to lose weight and improve blood sugar control. Most recent  A1C was 9%  Preferred Learning Style:     No preference indicated   Learning Readiness:   Not ready  Contemplating  Ready  Change in progress  MEDICATIONS: see list   DIETARY INTAKE:  24-hr recall:  B ( AM): boiled egg, grils and slice of toast, coffee,  Snk ( AM):  L ( PM): Toss salad-mixed greens, grilled chicken, dressing-Italian .; water or coffee Snk ( PM): oatmeal cookie with creme filling 1x week D ( PM): Grilled chicken, broccoli, steamed black beans, corn, peppers,  Snk ( PM): water Beverages: water, coffee, Diet soda occassionally  Usual physical activity: limited to back surgery  Estimated energy needs: 1200 calories 135 g carbohydrates 90 g protein 33  g fat  Progress Towards Goal(s):  In progress.   Nutritional Diagnosis:  NB-1.1 Food and nutrition-related knowledge deficit As related to diabetes.  As evidenced by A1C 9%..    Intervention:  Nutrition counseling, diabetes education.. Plan:  Aim for 2-3 Carb Choices per meal (30-45 grams) +/- 1 either way  Aim for 0-1 Carbs per snack if hungry  Include protein in moderation with your meals and snacks Consider reading food labels for Total Carbohydrate and Fat Grams of foods Consider  increasing your activity level by 15 for 30 minutes daily as tolerated Consider checking BG at alternate times per day as directed by MD  Consider taking medication  as  directed by MD  Goal: Lose 1 lb 2. Get A1C down to 7% within three months. 3. Measure food outs.   Teaching Method Utilized: nutrition and diabetes education. Visual Auditory Hands on  Handouts given during visit include:    My Plate  Carb Counting and Food Label handouts Meal Plan Card  Barriers to learning/adherence to lifestyle change: none  Demonstrated degree of understanding via:  Teach Back   Monitoring/Evaluation:  Dietary intake, exercise, meal planning, SBG, and body weight in 1 month(s).

## 2014-09-28 NOTE — Patient Instructions (Signed)
Plan:  Aim for 2-3 Carb Choices per meal (30-45 grams) +/- 1 either way  Aim for 0-1 Carbs per snack if hungry  Follow the Plate Method Include protein in moderation with your meals and snacks Consider reading food labels for Total Carbohydrate and Fat Grams of foods Consider  increasing your activity level by 15 for 30 minutes daily as tolerated Be sure to checking BG at alternate times per day as directed by MD  Continue taking medication  as directed by MD Goal: Lose 1 lb per week.2. Measure foods out. 3.Get A1C down to 7% in three months.

## 2014-09-28 NOTE — Patient Instructions (Signed)
Intervention:  Nutrition counseling, diabetes education.. Plan:  Aim for 2-3 Carb Choices per meal (30-45 grams) +/- 1 either way  Aim for 0-1 Carbs per snack if hungry  Include protein in moderation with your meals and snacks Consider reading food labels for Total Carbohydrate and Fat Grams of foods Consider  increasing your activity level by 15 for 30 minutes daily as tolerated Consider checking BG at alternate times per day as directed by MD  Consider taking medication  as directed by MD  Goal: Lose 1 lb 2. Get A1C down to 7% within three months. 3. Measure food outs.

## 2014-10-12 DIAGNOSIS — M961 Postlaminectomy syndrome, not elsewhere classified: Secondary | ICD-10-CM | POA: Diagnosis not present

## 2014-11-06 ENCOUNTER — Ambulatory Visit: Payer: Medicare Other | Admitting: Nutrition

## 2014-11-06 ENCOUNTER — Ambulatory Visit: Payer: Self-pay | Admitting: Nutrition

## 2014-11-13 DIAGNOSIS — J069 Acute upper respiratory infection, unspecified: Secondary | ICD-10-CM | POA: Diagnosis not present

## 2014-11-23 DIAGNOSIS — M5126 Other intervertebral disc displacement, lumbar region: Secondary | ICD-10-CM | POA: Diagnosis not present

## 2014-11-23 DIAGNOSIS — M961 Postlaminectomy syndrome, not elsewhere classified: Secondary | ICD-10-CM | POA: Diagnosis not present

## 2014-11-23 DIAGNOSIS — M5116 Intervertebral disc disorders with radiculopathy, lumbar region: Secondary | ICD-10-CM | POA: Diagnosis not present

## 2014-11-29 DIAGNOSIS — M5116 Intervertebral disc disorders with radiculopathy, lumbar region: Secondary | ICD-10-CM | POA: Diagnosis not present

## 2014-11-29 DIAGNOSIS — E119 Type 2 diabetes mellitus without complications: Secondary | ICD-10-CM | POA: Diagnosis not present

## 2014-11-29 DIAGNOSIS — M961 Postlaminectomy syndrome, not elsewhere classified: Secondary | ICD-10-CM | POA: Diagnosis not present

## 2014-11-29 DIAGNOSIS — E559 Vitamin D deficiency, unspecified: Secondary | ICD-10-CM | POA: Diagnosis not present

## 2014-11-29 DIAGNOSIS — E785 Hyperlipidemia, unspecified: Secondary | ICD-10-CM | POA: Diagnosis not present

## 2014-11-29 DIAGNOSIS — I10 Essential (primary) hypertension: Secondary | ICD-10-CM | POA: Diagnosis not present

## 2014-11-29 DIAGNOSIS — M5126 Other intervertebral disc displacement, lumbar region: Secondary | ICD-10-CM | POA: Diagnosis not present

## 2014-12-06 DIAGNOSIS — I10 Essential (primary) hypertension: Secondary | ICD-10-CM | POA: Diagnosis not present

## 2014-12-06 DIAGNOSIS — E782 Mixed hyperlipidemia: Secondary | ICD-10-CM | POA: Diagnosis not present

## 2014-12-06 DIAGNOSIS — E1165 Type 2 diabetes mellitus with hyperglycemia: Secondary | ICD-10-CM | POA: Diagnosis not present

## 2014-12-06 DIAGNOSIS — E559 Vitamin D deficiency, unspecified: Secondary | ICD-10-CM | POA: Diagnosis not present

## 2014-12-19 DIAGNOSIS — M79604 Pain in right leg: Secondary | ICD-10-CM | POA: Diagnosis not present

## 2014-12-19 DIAGNOSIS — R262 Difficulty in walking, not elsewhere classified: Secondary | ICD-10-CM | POA: Diagnosis not present

## 2014-12-19 DIAGNOSIS — M5136 Other intervertebral disc degeneration, lumbar region: Secondary | ICD-10-CM | POA: Diagnosis not present

## 2014-12-19 DIAGNOSIS — M6281 Muscle weakness (generalized): Secondary | ICD-10-CM | POA: Diagnosis not present

## 2014-12-21 DIAGNOSIS — R262 Difficulty in walking, not elsewhere classified: Secondary | ICD-10-CM | POA: Diagnosis not present

## 2014-12-21 DIAGNOSIS — M79604 Pain in right leg: Secondary | ICD-10-CM | POA: Diagnosis not present

## 2014-12-21 DIAGNOSIS — M6281 Muscle weakness (generalized): Secondary | ICD-10-CM | POA: Diagnosis not present

## 2014-12-21 DIAGNOSIS — M5136 Other intervertebral disc degeneration, lumbar region: Secondary | ICD-10-CM | POA: Diagnosis not present

## 2014-12-27 DIAGNOSIS — M79604 Pain in right leg: Secondary | ICD-10-CM | POA: Diagnosis not present

## 2014-12-27 DIAGNOSIS — M5136 Other intervertebral disc degeneration, lumbar region: Secondary | ICD-10-CM | POA: Diagnosis not present

## 2014-12-27 DIAGNOSIS — R262 Difficulty in walking, not elsewhere classified: Secondary | ICD-10-CM | POA: Diagnosis not present

## 2014-12-27 DIAGNOSIS — M6281 Muscle weakness (generalized): Secondary | ICD-10-CM | POA: Diagnosis not present

## 2014-12-28 DIAGNOSIS — M5116 Intervertebral disc disorders with radiculopathy, lumbar region: Secondary | ICD-10-CM | POA: Diagnosis not present

## 2014-12-28 DIAGNOSIS — M5126 Other intervertebral disc displacement, lumbar region: Secondary | ICD-10-CM | POA: Diagnosis not present

## 2014-12-28 DIAGNOSIS — M961 Postlaminectomy syndrome, not elsewhere classified: Secondary | ICD-10-CM | POA: Diagnosis not present

## 2014-12-29 DIAGNOSIS — R262 Difficulty in walking, not elsewhere classified: Secondary | ICD-10-CM | POA: Diagnosis not present

## 2014-12-29 DIAGNOSIS — M5136 Other intervertebral disc degeneration, lumbar region: Secondary | ICD-10-CM | POA: Diagnosis not present

## 2014-12-29 DIAGNOSIS — M6281 Muscle weakness (generalized): Secondary | ICD-10-CM | POA: Diagnosis not present

## 2014-12-29 DIAGNOSIS — M79604 Pain in right leg: Secondary | ICD-10-CM | POA: Diagnosis not present

## 2015-01-03 DIAGNOSIS — R262 Difficulty in walking, not elsewhere classified: Secondary | ICD-10-CM | POA: Diagnosis not present

## 2015-01-03 DIAGNOSIS — M6281 Muscle weakness (generalized): Secondary | ICD-10-CM | POA: Diagnosis not present

## 2015-01-03 DIAGNOSIS — M79604 Pain in right leg: Secondary | ICD-10-CM | POA: Diagnosis not present

## 2015-01-03 DIAGNOSIS — M5136 Other intervertebral disc degeneration, lumbar region: Secondary | ICD-10-CM | POA: Diagnosis not present

## 2015-01-04 DIAGNOSIS — M5136 Other intervertebral disc degeneration, lumbar region: Secondary | ICD-10-CM | POA: Diagnosis not present

## 2015-01-04 DIAGNOSIS — R262 Difficulty in walking, not elsewhere classified: Secondary | ICD-10-CM | POA: Diagnosis not present

## 2015-01-04 DIAGNOSIS — M79604 Pain in right leg: Secondary | ICD-10-CM | POA: Diagnosis not present

## 2015-01-04 DIAGNOSIS — M6281 Muscle weakness (generalized): Secondary | ICD-10-CM | POA: Diagnosis not present

## 2015-02-08 DIAGNOSIS — M76891 Other specified enthesopathies of right lower limb, excluding foot: Secondary | ICD-10-CM | POA: Diagnosis not present

## 2015-02-08 DIAGNOSIS — M722 Plantar fascial fibromatosis: Secondary | ICD-10-CM | POA: Diagnosis not present

## 2015-02-26 DIAGNOSIS — E559 Vitamin D deficiency, unspecified: Secondary | ICD-10-CM | POA: Diagnosis not present

## 2015-02-26 DIAGNOSIS — E1165 Type 2 diabetes mellitus with hyperglycemia: Secondary | ICD-10-CM | POA: Diagnosis not present

## 2015-02-26 DIAGNOSIS — E782 Mixed hyperlipidemia: Secondary | ICD-10-CM | POA: Diagnosis not present

## 2015-02-26 DIAGNOSIS — I10 Essential (primary) hypertension: Secondary | ICD-10-CM | POA: Diagnosis not present

## 2015-02-26 DIAGNOSIS — Z713 Dietary counseling and surveillance: Secondary | ICD-10-CM | POA: Diagnosis not present

## 2015-02-26 DIAGNOSIS — J441 Chronic obstructive pulmonary disease with (acute) exacerbation: Secondary | ICD-10-CM | POA: Diagnosis not present

## 2015-03-08 DIAGNOSIS — E1165 Type 2 diabetes mellitus with hyperglycemia: Secondary | ICD-10-CM | POA: Diagnosis not present

## 2015-03-08 DIAGNOSIS — E782 Mixed hyperlipidemia: Secondary | ICD-10-CM | POA: Diagnosis not present

## 2015-03-08 DIAGNOSIS — E559 Vitamin D deficiency, unspecified: Secondary | ICD-10-CM | POA: Diagnosis not present

## 2015-03-08 DIAGNOSIS — I10 Essential (primary) hypertension: Secondary | ICD-10-CM | POA: Diagnosis not present

## 2015-03-27 DIAGNOSIS — E119 Type 2 diabetes mellitus without complications: Secondary | ICD-10-CM | POA: Diagnosis not present

## 2015-03-27 DIAGNOSIS — H4011X2 Primary open-angle glaucoma, moderate stage: Secondary | ICD-10-CM | POA: Diagnosis not present

## 2015-03-27 DIAGNOSIS — Z961 Presence of intraocular lens: Secondary | ICD-10-CM | POA: Diagnosis not present

## 2015-03-27 DIAGNOSIS — H3531 Nonexudative age-related macular degeneration: Secondary | ICD-10-CM | POA: Diagnosis not present

## 2015-04-10 DIAGNOSIS — Z1231 Encounter for screening mammogram for malignant neoplasm of breast: Secondary | ICD-10-CM | POA: Diagnosis not present

## 2015-04-12 DIAGNOSIS — Z01419 Encounter for gynecological examination (general) (routine) without abnormal findings: Secondary | ICD-10-CM | POA: Diagnosis not present

## 2015-04-12 DIAGNOSIS — Z6832 Body mass index (BMI) 32.0-32.9, adult: Secondary | ICD-10-CM | POA: Diagnosis not present

## 2015-04-17 DIAGNOSIS — M5126 Other intervertebral disc displacement, lumbar region: Secondary | ICD-10-CM | POA: Diagnosis not present

## 2015-04-25 DIAGNOSIS — Z1211 Encounter for screening for malignant neoplasm of colon: Secondary | ICD-10-CM | POA: Diagnosis not present

## 2015-05-01 DIAGNOSIS — H4011X2 Primary open-angle glaucoma, moderate stage: Secondary | ICD-10-CM | POA: Diagnosis not present

## 2015-05-04 ENCOUNTER — Encounter (INDEPENDENT_AMBULATORY_CARE_PROVIDER_SITE_OTHER): Payer: Self-pay | Admitting: *Deleted

## 2015-05-29 ENCOUNTER — Encounter (INDEPENDENT_AMBULATORY_CARE_PROVIDER_SITE_OTHER): Payer: Self-pay | Admitting: *Deleted

## 2015-05-29 ENCOUNTER — Other Ambulatory Visit (INDEPENDENT_AMBULATORY_CARE_PROVIDER_SITE_OTHER): Payer: Self-pay | Admitting: *Deleted

## 2015-05-29 ENCOUNTER — Telehealth (INDEPENDENT_AMBULATORY_CARE_PROVIDER_SITE_OTHER): Payer: Self-pay | Admitting: *Deleted

## 2015-05-29 ENCOUNTER — Encounter (INDEPENDENT_AMBULATORY_CARE_PROVIDER_SITE_OTHER): Payer: Self-pay | Admitting: Internal Medicine

## 2015-05-29 ENCOUNTER — Ambulatory Visit (INDEPENDENT_AMBULATORY_CARE_PROVIDER_SITE_OTHER): Payer: Medicare Other | Admitting: Internal Medicine

## 2015-05-29 VITALS — BP 104/52 | HR 60 | Temp 97.9°F | Ht 62.0 in | Wt 178.9 lb

## 2015-05-29 DIAGNOSIS — R140 Abdominal distension (gaseous): Secondary | ICD-10-CM

## 2015-05-29 DIAGNOSIS — R195 Other fecal abnormalities: Secondary | ICD-10-CM | POA: Diagnosis not present

## 2015-05-29 DIAGNOSIS — R197 Diarrhea, unspecified: Secondary | ICD-10-CM

## 2015-05-29 DIAGNOSIS — K625 Hemorrhage of anus and rectum: Secondary | ICD-10-CM

## 2015-05-29 DIAGNOSIS — R1084 Generalized abdominal pain: Secondary | ICD-10-CM | POA: Diagnosis not present

## 2015-05-29 MED ORDER — PEG 3350-KCL-NA BICARB-NACL 420 G PO SOLR: 4000.0000 mL | Freq: Once | ORAL | Status: DC

## 2015-05-29 NOTE — Telephone Encounter (Signed)
Patient needs trilyte 

## 2015-05-29 NOTE — Patient Instructions (Addendum)
Colonoscopy. The risks and benefits such as perforation, bleeding, and infection were reviewed with the patient and is agreeable. Fiber  4gm tabs

## 2015-05-29 NOTE — Progress Notes (Signed)
   Subjective:    Patient ID: Vicki Moon, female    DOB: 08-10-1951, 64 y.o.   MRN: [id number]  HPI Referred to our office by Dr. Derenda Mis for a screening. colonoscopy. She says she had a positive stool card with Dr. Derenda Mis.  She tells me she has diarrhea. She may have 10 stools a day for several months.  She says for the past few months, the diarrhea has been out of control. She has not been on any recent antibiotics.  The diarrhea is not new. She had her GB removed years ago and ever since she has had diarrhea after eating.  She says usually has 2-3 stools a day.  She says she feels like she is 10 months pregnant. She has had the abdominal bloating for about 2 months. She has an uncomfortable in her abdomen.  Her appetite is okay. There has been no weight loss. She denies seeing any melena or BRRB. Her last HA1C 7.3 in 3 months ago.  No family hx of colon cancer. She has never undergone a colonoscopy in the past She takes 2-4 Imodium every day.    Review of Systems Retired . Married. 3 children in good health.      Past Medical History  Diagnosis Date  . PONV (postoperative nausea and vomiting)   . Diabetes mellitus without complication     Type 2 IDDM x 5 years  . Herniated lumbar intervertebral disc 06/2014  . Glaucoma     Past Surgical History  Procedure Laterality Date  . Cholecystectomy    . Tubal ligation    . Eye surgery    . Lumbar laminectomy Right 07/12/2014    Procedure: LUMBAR FIVE TO SACRAL ONE MICRODISCECTOMY;  Surgeon: Marybelle Killings, MD;  Location: [facility];  Service: Orthopedics;  Laterality: Right;    No Known Allergies  Current Outpatient Prescriptions on File Prior to Visit  Medication Sig Dispense Refill  . insulin detemir (LEVEMIR) 100 UNIT/ML injection Inject 35 Units into the skin at bedtime.     Marland Kitchen latanoprost (XALATAN) 0.005 % ophthalmic solution Place 1 drop into both eyes at bedtime.    Marland Kitchen linagliptin (TRADJENTA) 5 MG TABS tablet Take 5 mg by  mouth daily.    Marland Kitchen lisinopril (PRINIVIL,ZESTRIL) 10 MG tablet Take 10 mg by mouth daily.    . metFORMIN (GLUCOPHAGE) 500 MG tablet Take 1,000 mg by mouth 2 (two) times daily with a meal.      No current facility-administered medications on file prior to visit.      Objective:   Physical Exam Blood pressure 104/52, pulse 60, temperature 97.9 F (36.6 C), height 5\' 2"  (1.575 m), weight 178 lb 14.4 oz (81.149 kg).  Alert and oriented. Skin warm and dry. Oral mucosa is moist.   . Sclera anicteric, conjunctivae is pink. Thyroid not enlarged. No cervical lymphadenopathy. Lungs clear. Heart regular rate and rhythm.  Abdomen is soft. Bowel sounds are positive. No hepatomegaly. No abdominal masses felt. No tenderness. Abdomen distended but soft.  No edema to lower extremities. Stool loose and guaiac negative.     Developer: [id number] ( 10/2018) Card: Lot [id number] (12/18)    Assessment & Plan:  Heme positive stool: Colonic neoplasm needs to be ruled out.  Will schedule a colonoscopy. The risks and benefits such as perforation, bleeding, and infection were reviewed with the patient and is agreeable. Continue Imodium daily. Fiber 4gm.

## 2015-05-30 LAB — CREATININE, SERUM: Creat: 0.52 mg/dL (ref 0.50–1.10)

## 2015-05-31 ENCOUNTER — Encounter (INDEPENDENT_AMBULATORY_CARE_PROVIDER_SITE_OTHER): Payer: Self-pay

## 2015-06-01 ENCOUNTER — Ambulatory Visit (HOSPITAL_COMMUNITY)
Admission: RE | Admit: 2015-06-01 | Discharge: 2015-06-01 | Disposition: A | Discharge status: Home / Self Care | Payer: Medicare Other | Source: Ambulatory Visit | Attending: Internal Medicine | Admitting: Internal Medicine

## 2015-06-01 DIAGNOSIS — K625 Hemorrhage of anus and rectum: Secondary | ICD-10-CM | POA: Diagnosis not present

## 2015-06-01 DIAGNOSIS — M5137 Other intervertebral disc degeneration, lumbosacral region: Secondary | ICD-10-CM | POA: Diagnosis not present

## 2015-06-01 DIAGNOSIS — R197 Diarrhea, unspecified: Secondary | ICD-10-CM | POA: Diagnosis not present

## 2015-06-01 DIAGNOSIS — R140 Abdominal distension (gaseous): Secondary | ICD-10-CM | POA: Diagnosis not present

## 2015-06-01 DIAGNOSIS — R1084 Generalized abdominal pain: Secondary | ICD-10-CM | POA: Diagnosis not present

## 2015-06-01 DIAGNOSIS — R195 Other fecal abnormalities: Secondary | ICD-10-CM

## 2015-06-01 DIAGNOSIS — K760 Fatty (change of) liver, not elsewhere classified: Secondary | ICD-10-CM | POA: Diagnosis not present

## 2015-06-01 MED ORDER — IOHEXOL 300 MG/ML  SOLN
100.0000 mL | Freq: Once | INTRAMUSCULAR | Status: AC | PRN
  Administered 2015-06-01: 100 mL via INTRAVENOUS

## 2015-06-04 DIAGNOSIS — E785 Hyperlipidemia, unspecified: Secondary | ICD-10-CM | POA: Diagnosis not present

## 2015-06-04 DIAGNOSIS — E1165 Type 2 diabetes mellitus with hyperglycemia: Secondary | ICD-10-CM | POA: Diagnosis not present

## 2015-06-04 DIAGNOSIS — I10 Essential (primary) hypertension: Secondary | ICD-10-CM | POA: Diagnosis not present

## 2015-06-04 DIAGNOSIS — Z713 Dietary counseling and surveillance: Secondary | ICD-10-CM | POA: Diagnosis not present

## 2015-06-04 DIAGNOSIS — E559 Vitamin D deficiency, unspecified: Secondary | ICD-10-CM | POA: Diagnosis not present

## 2015-06-15 DIAGNOSIS — I10 Essential (primary) hypertension: Secondary | ICD-10-CM | POA: Diagnosis not present

## 2015-06-15 DIAGNOSIS — E559 Vitamin D deficiency, unspecified: Secondary | ICD-10-CM | POA: Diagnosis not present

## 2015-06-15 DIAGNOSIS — E1165 Type 2 diabetes mellitus with hyperglycemia: Secondary | ICD-10-CM | POA: Diagnosis not present

## 2015-06-15 DIAGNOSIS — E782 Mixed hyperlipidemia: Secondary | ICD-10-CM | POA: Diagnosis not present

## 2015-06-26 ENCOUNTER — Telehealth (INDEPENDENT_AMBULATORY_CARE_PROVIDER_SITE_OTHER): Payer: Self-pay | Admitting: *Deleted

## 2015-06-26 NOTE — Telephone Encounter (Signed)
She has procedure for Thursday--she needs to talk to you about her insulin tomorrow.

## 2015-06-26 NOTE — Telephone Encounter (Signed)
Hold insulin

## 2015-06-28 ENCOUNTER — Encounter (HOSPITAL_COMMUNITY)
Admission: RE | Disposition: A | Discharge status: Home / Self Care | Payer: Self-pay | Source: Ambulatory Visit | Attending: Internal Medicine

## 2015-06-28 ENCOUNTER — Encounter (HOSPITAL_COMMUNITY): Payer: Self-pay | Admitting: *Deleted

## 2015-06-28 ENCOUNTER — Ambulatory Visit (HOSPITAL_COMMUNITY)
Admission: RE | Admit: 2015-06-28 | Discharge: 2015-06-28 | Disposition: A | Discharge status: Home / Self Care | Payer: Medicare Other | Source: Ambulatory Visit | Attending: Internal Medicine | Admitting: Internal Medicine

## 2015-06-28 DIAGNOSIS — K621 Rectal polyp: Secondary | ICD-10-CM | POA: Diagnosis not present

## 2015-06-28 DIAGNOSIS — D123 Benign neoplasm of transverse colon: Secondary | ICD-10-CM | POA: Diagnosis not present

## 2015-06-28 DIAGNOSIS — K5730 Diverticulosis of large intestine without perforation or abscess without bleeding: Secondary | ICD-10-CM | POA: Diagnosis not present

## 2015-06-28 DIAGNOSIS — Z79899 Other long term (current) drug therapy: Secondary | ICD-10-CM | POA: Insufficient documentation

## 2015-06-28 DIAGNOSIS — Z794 Long term (current) use of insulin: Secondary | ICD-10-CM | POA: Diagnosis not present

## 2015-06-28 DIAGNOSIS — E119 Type 2 diabetes mellitus without complications: Secondary | ICD-10-CM | POA: Diagnosis not present

## 2015-06-28 DIAGNOSIS — Z9049 Acquired absence of other specified parts of digestive tract: Secondary | ICD-10-CM | POA: Insufficient documentation

## 2015-06-28 DIAGNOSIS — R195 Other fecal abnormalities: Secondary | ICD-10-CM

## 2015-06-28 DIAGNOSIS — K648 Other hemorrhoids: Secondary | ICD-10-CM

## 2015-06-28 DIAGNOSIS — F17210 Nicotine dependence, cigarettes, uncomplicated: Secondary | ICD-10-CM | POA: Insufficient documentation

## 2015-06-28 DIAGNOSIS — K644 Residual hemorrhoidal skin tags: Secondary | ICD-10-CM | POA: Diagnosis not present

## 2015-06-28 DIAGNOSIS — D128 Benign neoplasm of rectum: Secondary | ICD-10-CM | POA: Diagnosis not present

## 2015-06-28 DIAGNOSIS — R197 Diarrhea, unspecified: Secondary | ICD-10-CM

## 2015-06-28 DIAGNOSIS — D125 Benign neoplasm of sigmoid colon: Secondary | ICD-10-CM | POA: Diagnosis not present

## 2015-06-28 HISTORY — PX: COLONOSCOPY: SHX5424

## 2015-06-28 LAB — GLUCOSE, CAPILLARY: GLUCOSE-CAPILLARY: 135 mg/dL — AB (ref 65–99)

## 2015-06-28 SURGERY — COLONOSCOPY
Anesthesia: Moderate Sedation

## 2015-06-28 MED ORDER — MEPERIDINE HCL 50 MG/ML IJ SOLN
INTRAMUSCULAR | Status: AC
  Filled 2015-06-28: qty 1

## 2015-06-28 MED ORDER — MIDAZOLAM HCL 5 MG/5ML IJ SOLN
INTRAMUSCULAR | Status: DC | PRN
  Administered 2015-06-28 (×4): 2 mg via INTRAVENOUS

## 2015-06-28 MED ORDER — MIDAZOLAM HCL 5 MG/5ML IJ SOLN
INTRAMUSCULAR | Status: AC
  Filled 2015-06-28: qty 10

## 2015-06-28 MED ORDER — DICYCLOMINE HCL 10 MG PO CAPS: 10.0000 mg | ORAL_CAPSULE | Freq: Three times a day (TID) | ORAL | Status: DC

## 2015-06-28 MED ORDER — MEPERIDINE HCL 50 MG/ML IJ SOLN
INTRAMUSCULAR | Status: DC | PRN
  Administered 2015-06-28 (×2): 25 mg via INTRAVENOUS

## 2015-06-28 MED ORDER — SIMETHICONE 40 MG/0.6ML PO SUSP
ORAL | Status: DC | PRN
Start: 2015-06-28 — End: 2015-06-28
  Administered 2015-06-28: 15:00:00

## 2015-06-28 MED ORDER — BENEFIBER DRINK MIX PO PACK: 4.0000 g | PACK | Freq: Every day | ORAL | Status: DC

## 2015-06-28 NOTE — Op Note (Signed)
COLONOSCOPY PROCEDURE REPORT  PATIENT:  Vicki Moon  MR#:  [id number] Birthdate:  04-28-1951, 64 y.o., female Endoscopist:  Dr. Rogene Houston, MD Referred By:  Dr.  Deloria Lair, MD Procedure Date: 06/28/2015  Procedure:   Colonoscopy  Indications:   Patient is 88 old Caucasian female who presents with 2 month history of diarrhea. She was also noted to have heme-positive stool. Patient has been on metformin for few years. Dose reduction recently did not ameliorate her diarrhea.  She has never been screened for CRC.  Family history is negative for CRC.  Informed Consent:  The procedure and risks were reviewed with the patient and informed consent was obtained.  Medications:  Demerol 50 mg IV Versed 8 mg IV  Description of procedure:  After a digital rectal exam was performed, that colonoscope was advanced from the anus through the rectum and colon to the area of the cecum, ileocecal valve and appendiceal orifice. The cecum was deeply intubated. These structures were well-seen and photographed for the record. From the level of the cecum and ileocecal valve, the scope was slowly and cautiously withdrawn. The mucosal surfaces were carefully surveyed utilizing scope tip to flexion to facilitate fold flattening as needed. The scope was pulled down into the rectum where a thorough exam including retroflexion was performed.  Findings:   Prep satisfactory. Scattered diverticula throughout the colon but most of these were at sigmoid colon. Three broad-based polyps 7 to 10 mm in diameter were hot snared and submitted together. One was located at splenic flexure and others too at proximal sigmoid colon. Single [place] clip applied to 1 polypectomy site to control bleeding. Two polyps or hot snared from sigmoid colon. One was 12 mm and the other one was much smaller. Once again bleeding noted from polypectomy site and easily controlled with application of another [address] [place]  [employer] clip. Three small rectal polyps or hot snared and submitted together. Random biopsies taken from mucosa of sigmoid colon given chronic diarrhea. Prominent hemorrhoids below the dentate line.   Therapeutic/Diagnostic Maneuvers Performed:  See above  Complications:  None  Cecal Withdrawal Time:  55 minutes  Impression:  Pancolonic diverticulosis. Three polyps or hot snared and submitted together. These range in size from 7-10 mm. Clip applied for hemostasis 21 polypectomy site. 12 mm polyp hot snare from distal sigmoid colon and submitted with a smaller polyp which is also hot snared. Clip applied to polypectomy site for hemostasis. Random biopsies taken from mucosa of sigmoid colon looking for microscopic or collagenous colitis. Three small rectal polyps or hot snared and submitted together. External hemorrhoids.  Comment; Patient had 8 polyps altogether ranging in size from 5 to12 mm. Two clips applied for hemostasis at polypectomy sites( 1 clips at each site)  Suspect diarrhea and urgency may be secondary to irritable bowel syndrome unless biopsy reveals microscopic or collagenous colitis.   Recommendations:  Standard instructions given. No aspirin or NSAIDs for 10 days. Dicyclomine 10 mg by mouth before meals. Benefiber 4 g by mouth daily at bedtime I will contact patient with biopsy results and further recommendations.  REHMAN,NAJEEB U  06/28/2015 4:12 PM  CC: Dr. Deloria Lair, MD & Dr. Rayne Du ref. provider found

## 2015-06-28 NOTE — H&P (Signed)
Vicki Moon is an 64 y.o. female.   Chief Complaint: Patient is here for colonoscopy. HPI: Patient patient is 32 old Caucasian female who presents with 2 month history of diarrhea. Most of her bowel movements occur after meals. She denies melena or rectal bleeding. On Sundays she has as many as 8-10 stools per day. She denies nocturnal diarrhea melena or rectal bleeding. She was recently noted to have heme-positive stool. There is no history of recent robotic use. She has good appetite and has not lost any weight. She does complain of bloating. Family history is negative for CRC or IBD.  Past Medical History  Diagnosis Date  . PONV (postoperative nausea and vomiting)   . Diabetes mellitus without complication     Type 2 IDDM x 5 years  . Herniated lumbar intervertebral disc 06/2014  . Glaucoma     Past Surgical History  Procedure Laterality Date  . Cholecystectomy    . Tubal ligation    . Eye surgery    . Lumbar laminectomy Right 07/12/2014    Procedure: LUMBAR FIVE TO SACRAL ONE MICRODISCECTOMY;  Surgeon: Marybelle Killings, MD;  Location: [place];  Service: Orthopedics;  Laterality: Right;    Family History  Problem Relation Age of Onset  . Hypertension Mother   . Stroke Mother   . Cancer Father     brain tumor  . Diabetes Father    Social History:  reports that she has been smoking Cigarettes.  She has a 2.5 pack-year smoking history. She has never used smokeless tobacco. She reports that she does not drink alcohol or use illicit drugs.  Allergies: No Known Allergies  Medications Prior to Admission  Medication Sig Dispense Refill  . insulin detemir (LEVEMIR) 100 UNIT/ML injection Inject 35 Units into the skin at bedtime.     Marland Kitchen latanoprost (XALATAN) 0.005 % ophthalmic solution Place 1 drop into both eyes at bedtime.    Marland Kitchen linagliptin (TRADJENTA) 5 MG TABS tablet Take 5 mg by mouth daily.    Marland Kitchen lisinopril (PRINIVIL,ZESTRIL) 10 MG tablet Take 10 mg by mouth daily.    . metFORMIN  (GLUCOPHAGE) 500 MG tablet Take 1,000 mg by mouth 2 (two) times daily with a meal.     . Multiple Vitamins-Minerals (PRESERVISION AREDS PO) Take by mouth 2 (two) times daily.    . polyethylene glycol-electrolytes (NULYTELY/GOLYTELY) 420 G solution Take 4,000 mLs by mouth once. 4000 mL 0  . ranitidine (ZANTAC) 150 MG tablet Take 150 mg by mouth at bedtime.      Results for orders placed or performed during the hospital encounter of 06/28/15 (from the past 48 hour(s))  Glucose, capillary     Status: Abnormal   Collection Time: 06/28/15  2:01 PM  Result Value Ref Range   Glucose-Capillary 135 (H) 65 - 99 mg/dL   No results found.  ROS  Blood pressure 148/59, pulse 66, resp. rate 18, height 5\' 2"  (1.575 m), SpO2 97 %. Physical Exam  Constitutional: She appears well-developed and well-nourished.  HENT:  Mouth/Throat: Oropharynx is clear and moist.  Eyes: Conjunctivae are normal. No scleral icterus.  Neck: No thyromegaly present.  Cardiovascular: Normal rate, regular rhythm and normal heart sounds.   No murmur heard. Respiratory: Effort normal and breath sounds normal.  GI: Soft. She exhibits no distension and no mass. There is no tenderness.  Musculoskeletal: She exhibits no edema.  Lymphadenopathy:    She has no cervical adenopathy.  Neurological: She is alert.  Skin:  Skin is warm and dry.     Assessment/Plan Diarrhea and heme positive stool. Diagnostic colonoscopy.  REHMAN,NAJEEB U 06/28/2015, 2:50 PM

## 2015-06-28 NOTE — Discharge Instructions (Signed)
No aspirin or NSAIDs for 10 days. Resume usual medications and diet. Dicyclomine 10 mg by mouth 30 minutes before each meal. Fiber 4 g by mouth daily at bedtime. No driving for 24 hours. Patient will call with biopsy results. Remember you cannot have an MRI until clips have passed   Colonoscopy, Care After These instructions give you information on caring for yourself after your procedure. Your doctor may also give you more specific instructions. Call your doctor if you have any problems or questions after your procedure. HOME CARE  Do not drive for 24 hours.  Do not sign important papers or use machinery for 24 hours.  You may shower.  You may go back to your usual activities, but go slower for the first 24 hours.  Take rest breaks often during the first 24 hours.  Walk around or use warm packs on your belly (abdomen) if you have belly cramping or gas.  Drink enough fluids to keep your pee (urine) clear or pale yellow.  Resume your normal diet. Avoid heavy or fried foods.  Avoid drinking alcohol for 24 hours or as told by your doctor.  Only take medicines as told by your doctor. If a tissue sample (biopsy) was taken during the procedure:   Do not take aspirin or blood thinners for 7 days, or as told by your doctor.  Do not drink alcohol for 7 days, or as told by your doctor.  Eat soft foods for the first 24 hours. GET HELP IF: You still have a small amount of blood in your poop (stool) 2-3 days after the procedure. GET HELP RIGHT AWAY IF:  You have more than a small amount of blood in your poop.  You see clumps of tissue (blood clots) in your poop.  Your belly is puffy (swollen).  You feel sick to your stomach (nauseous) or throw up (vomit).  You have a fever.  You have belly pain that gets worse and medicine does not help. MAKE SURE YOU:  Understand these instructions.  Will watch your condition.  Will get help right away if you are not doing well or get  worse. Document Released: 01/10/2011 Document Revised: 12/13/2013 Document Reviewed: 08/15/2013 [facility] Patient Information 2015 [place], [place]. This information is not intended to replace advice given to you by your health care provider. Make sure you discuss any questions you have with your health care provider.   Colon Polyps Polyps are lumps of extra tissue growing inside the body. Polyps can grow in the large intestine (colon). Most colon polyps are noncancerous (benign). However, some colon polyps can become cancerous over time. Polyps that are larger than a pea may be harmful. To be safe, caregivers remove and test all polyps. CAUSES  Polyps form when mutations in the genes cause your cells to grow and divide even though no more tissue is needed. RISK FACTORS There are a number of risk factors that can increase your chances of getting colon polyps. They include:  Being older than 50 years.  Family history of colon polyps or colon cancer.  Long-term colon diseases, such as colitis or Crohn disease.  Being overweight.  Smoking.  Being inactive.  Drinking too much alcohol. SYMPTOMS  Most small polyps do not cause symptoms. If symptoms are present, they may include:  Blood in the stool. The stool may look dark red or black.  Constipation or diarrhea that lasts longer than 1 week. DIAGNOSIS People often do not know they have polyps until their  caregiver finds them during a regular checkup. Your caregiver can use 4 tests to check for polyps:  Digital rectal exam. The caregiver wears gloves and feels inside the rectum. This test would find polyps only in the rectum.  Barium enema. The caregiver puts a liquid called barium into your rectum before taking X-rays of your colon. Barium makes your colon look white. Polyps are dark, so they are easy to see in the X-ray pictures.  Sigmoidoscopy. A thin, flexible tube (sigmoidoscope) is placed into your rectum. The sigmoidoscope has a  light and tiny camera in it. The caregiver uses the sigmoidoscope to look at the last third of your colon.  Colonoscopy. This test is like sigmoidoscopy, but the caregiver looks at the entire colon. This is the most common method for finding and removing polyps. TREATMENT  Any polyps will be removed during a sigmoidoscopy or colonoscopy. The polyps are then tested for cancer. PREVENTION  To help lower your risk of getting more colon polyps:  Eat plenty of fruits and vegetables. Avoid eating fatty foods.  Do not smoke.  Avoid drinking alcohol.  Exercise every day.  Lose weight if recommended by your caregiver.  Eat plenty of calcium and folate. Foods that are rich in calcium include milk, cheese, and broccoli. Foods that are rich in folate include chickpeas, kidney beans, and spinach. HOME CARE INSTRUCTIONS Keep all follow-up appointments as directed by your caregiver. You may need periodic exams to check for polyps. SEEK MEDICAL CARE IF: You notice bleeding during a bowel movement. Document Released: 09/03/2004 Document Revised: 03/01/2012 Document Reviewed: 02/17/2012 [facility] Patient Information 2015 [place], [place]. This information is not intended to replace advice given to you by your health care provider. Make sure you discuss any questions you have with your health care provider.

## 2015-06-28 NOTE — Progress Notes (Signed)
IV fluid volume intake is 724ml

## 2015-06-29 DIAGNOSIS — E1165 Type 2 diabetes mellitus with hyperglycemia: Secondary | ICD-10-CM | POA: Diagnosis not present

## 2015-06-29 DIAGNOSIS — I10 Essential (primary) hypertension: Secondary | ICD-10-CM | POA: Diagnosis not present

## 2015-06-29 DIAGNOSIS — E785 Hyperlipidemia, unspecified: Secondary | ICD-10-CM | POA: Diagnosis not present

## 2015-07-02 ENCOUNTER — Encounter (HOSPITAL_COMMUNITY): Payer: Self-pay | Admitting: Internal Medicine

## 2015-07-09 ENCOUNTER — Encounter (INDEPENDENT_AMBULATORY_CARE_PROVIDER_SITE_OTHER): Payer: Self-pay | Admitting: *Deleted

## 2015-07-18 DIAGNOSIS — M25561 Pain in right knee: Secondary | ICD-10-CM | POA: Diagnosis not present

## 2015-07-18 DIAGNOSIS — M25461 Effusion, right knee: Secondary | ICD-10-CM | POA: Diagnosis not present

## 2015-07-19 ENCOUNTER — Encounter (INDEPENDENT_AMBULATORY_CARE_PROVIDER_SITE_OTHER): Payer: Self-pay | Admitting: Internal Medicine

## 2015-07-19 ENCOUNTER — Ambulatory Visit (INDEPENDENT_AMBULATORY_CARE_PROVIDER_SITE_OTHER): Payer: Medicare Other | Admitting: Internal Medicine

## 2015-07-19 VITALS — BP 108/60 | HR 66 | Temp 98.7°F | Resp 18 | Ht 62.0 in | Wt 177.2 lb

## 2015-07-19 DIAGNOSIS — R197 Diarrhea, unspecified: Secondary | ICD-10-CM | POA: Diagnosis not present

## 2015-07-19 DIAGNOSIS — M5126 Other intervertebral disc displacement, lumbar region: Secondary | ICD-10-CM | POA: Diagnosis not present

## 2015-07-19 MED ORDER — DICYCLOMINE HCL 20 MG PO TABS: 20.0000 mg | ORAL_TABLET | Freq: Three times a day (TID) | ORAL | Status: DC

## 2015-07-19 NOTE — Progress Notes (Signed)
Presenting complaint;  Follow-up for diarrhea.  Database:  Patient is 64 year old Caucasian female who was evaluated on 05/29/2015 with 2 month history of diarrhea. She was having as many as 10-12 stools per day. Stool was guaiac positive. She shouldn't has been on metformin for at least 3 years and dose has not been changed. She did not lose weight for the diarrhea. She underwent colonoscopy on 06/28/2015 with removal of 8 polyps ranging in size from 5-12 mm in all of these polyps are hyperplastic. Biopsy from mucosa of sigmoid colon was negative for microscopic or collagenous colitis. Patient was begun on dicyclomine 10 mg 3 times a day which did not help in about 2 weeks ago dose was doubled to 20 mg 3 times a day.  Subjective:  Patient has had stool diary for the last 16 days. She had 54 bowel movements. Frequency has ranged from 1-7 bowel movements per day and she's had a couple of nocturnal bowel movements. She has not had any accidents although she's had urgency. She denies abdominal pain or rectal bleeding. She is not having any side effects with dicyclomine. She has not had normal bowel movement since the diarrhea started. She states she has lost about 19 pounds her last 3 years but she is only lost 1 pound in the last 7 weeks. She is trying to lose weight voluntarily in order to improve glucose level.   Current Medications: Outpatient Encounter Prescriptions as of 07/19/2015  Medication Sig  . dicyclomine (BENTYL) 10 MG capsule Take 1 capsule (10 mg total) by mouth 3 (three) times daily before meals.  . insulin detemir (LEVEMIR) 100 UNIT/ML injection Inject 35 Units into the skin at bedtime.   Marland Kitchen latanoprost (XALATAN) 0.005 % ophthalmic solution Place 1 drop into both eyes at bedtime.  Marland Kitchen lisinopril (PRINIVIL,ZESTRIL) 10 MG tablet Take 10 mg by mouth daily.  . metFORMIN (GLUCOPHAGE) 500 MG tablet Take 1,000 mg by mouth 2 (two) times daily with a meal.   . Multiple Vitamins-Minerals  (PRESERVISION AREDS PO) Take by mouth 2 (two) times daily.  . ranitidine (ZANTAC) 150 MG tablet Take 150 mg by mouth at bedtime.  . [DISCONTINUED] linagliptin (TRADJENTA) 5 MG TABS tablet Take 5 mg by mouth daily.  . [DISCONTINUED] Wheat Dextrin (BENEFIBER DRINK MIX) PACK Take 4 g by mouth at bedtime. (Patient not taking: Reported on 07/19/2015)   No facility-administered encounter medications on file as of 07/19/2015.     Objective: Blood pressure 108/60, pulse 66, temperature 98.7 F (37.1 C), temperature source Oral, resp. rate 18, height 5\' 2"  (1.575 m), weight 177 lb 3.2 oz (80.377 kg). Patient is alert and in no acute distress. Abdomen is full. Bowel sounds are normal. On palpation abdomen is soft and nontender without organomegaly or masses. No LE edema or clubbing noted.   Assessment:  #1. Diarrhea of 3 months duration. Differential diagnosis includes postinfectious IBS versus diabetic diarrhea. She is 50% better with dicyclomine. She is having average of 3.2 stools per day. #2. Multiple hyperplastic polyps. While there are no clear-cut guidelines for surveillance in these patients I would recommend she should undergo follow-up colonoscopy in 5 years.  Plan:  New prescription given for dicyclomine 20 mg by mouth 3 times a day. She can try Imodium OTC 2 mg by mouth daily to see if it helps. Should she become constipated she can use Dulcolax suppository or Fleet's enema and would also need to drop dicyclomine dose. She will keep stool diary for another 6-8  weeks and send [place] the summary at which time changes remain in her treatment. Office visit in 6 months. Next colonoscopy would be in 5 years.

## 2015-07-19 NOTE — Patient Instructions (Signed)
Keep stool diary for another 6 to 8 weeks and send [place] a summary.

## 2015-07-24 DIAGNOSIS — Z23 Encounter for immunization: Secondary | ICD-10-CM | POA: Diagnosis not present

## 2015-07-31 DIAGNOSIS — H4011X2 Primary open-angle glaucoma, moderate stage: Secondary | ICD-10-CM | POA: Diagnosis not present

## 2015-08-02 DIAGNOSIS — M25561 Pain in right knee: Secondary | ICD-10-CM | POA: Diagnosis not present

## 2015-08-20 ENCOUNTER — Telehealth (INDEPENDENT_AMBULATORY_CARE_PROVIDER_SITE_OTHER): Payer: Self-pay | Admitting: *Deleted

## 2015-08-20 NOTE — Telephone Encounter (Signed)
Vicki Moon has been taking the Dicyclomine 2 before every meal and as soon as the food hits her stomach, she goes to the bathroom. Her bottom is hurting, having cramping, lots of gas. On average, 5 times daily. The return phone number is [phone]. She also would like to know when should she schedule her next apt?

## 2015-08-20 NOTE — Telephone Encounter (Signed)
Dr.Rehman please review patient's symptoms and advise.

## 2015-08-20 NOTE — Telephone Encounter (Signed)
Forwarded to Dr.Rehman. 

## 2015-09-03 DIAGNOSIS — E785 Hyperlipidemia, unspecified: Secondary | ICD-10-CM | POA: Diagnosis not present

## 2015-09-03 DIAGNOSIS — I10 Essential (primary) hypertension: Secondary | ICD-10-CM | POA: Diagnosis not present

## 2015-09-03 DIAGNOSIS — E1165 Type 2 diabetes mellitus with hyperglycemia: Secondary | ICD-10-CM | POA: Diagnosis not present

## 2015-09-03 LAB — HEMOGLOBIN A1C: Hemoglobin A1C: 9.8

## 2015-09-05 NOTE — Telephone Encounter (Signed)
I did call patient last week and asked her to double up on dicyclomine to 20 mg before breakfast and lunch and call us with progress report in one to 2 weeks unless she is having side effects.

## 2015-09-07 DIAGNOSIS — E559 Vitamin D deficiency, unspecified: Secondary | ICD-10-CM | POA: Diagnosis not present

## 2015-09-07 DIAGNOSIS — E1165 Type 2 diabetes mellitus with hyperglycemia: Secondary | ICD-10-CM | POA: Diagnosis not present

## 2015-09-07 DIAGNOSIS — I10 Essential (primary) hypertension: Secondary | ICD-10-CM | POA: Diagnosis not present

## 2015-09-07 DIAGNOSIS — E785 Hyperlipidemia, unspecified: Secondary | ICD-10-CM | POA: Diagnosis not present

## 2015-09-14 DIAGNOSIS — I10 Essential (primary) hypertension: Secondary | ICD-10-CM | POA: Diagnosis not present

## 2015-09-14 DIAGNOSIS — E1165 Type 2 diabetes mellitus with hyperglycemia: Secondary | ICD-10-CM | POA: Diagnosis not present

## 2015-09-14 DIAGNOSIS — E782 Mixed hyperlipidemia: Secondary | ICD-10-CM | POA: Diagnosis not present

## 2015-09-18 DIAGNOSIS — H4011X2 Primary open-angle glaucoma, moderate stage: Secondary | ICD-10-CM | POA: Diagnosis not present

## 2015-09-21 ENCOUNTER — Telehealth: Payer: Self-pay

## 2015-09-21 NOTE — Telephone Encounter (Signed)
error 

## 2015-10-11 DIAGNOSIS — Z23 Encounter for immunization: Secondary | ICD-10-CM | POA: Diagnosis not present

## 2015-10-16 DIAGNOSIS — H353134 Nonexudative age-related macular degeneration, bilateral, advanced atrophic with subfoveal involvement: Secondary | ICD-10-CM | POA: Diagnosis not present

## 2015-10-16 DIAGNOSIS — H401132 Primary open-angle glaucoma, bilateral, moderate stage: Secondary | ICD-10-CM | POA: Diagnosis not present

## 2015-11-01 ENCOUNTER — Encounter (INDEPENDENT_AMBULATORY_CARE_PROVIDER_SITE_OTHER): Payer: Self-pay | Admitting: *Deleted

## 2015-11-12 ENCOUNTER — Telehealth: Payer: Self-pay | Admitting: "Endocrinology

## 2015-11-12 ENCOUNTER — Other Ambulatory Visit: Payer: Self-pay

## 2015-11-12 NOTE — Telephone Encounter (Signed)
Pt cannot afford her medication. The Levemir is $375 per month and the Humalog is $285 per month. Pt is requesting something cheaper.

## 2015-11-12 NOTE — Telephone Encounter (Signed)
Please advise, Patient needs to come in for a visit to switch to premixed insulin with instructions. In the meantime may give samples.

## 2015-11-12 NOTE — Telephone Encounter (Signed)
please call patient back she wants to discuss her medicine

## 2015-11-22 DIAGNOSIS — M79641 Pain in right hand: Secondary | ICD-10-CM | POA: Diagnosis not present

## 2015-11-26 NOTE — Telephone Encounter (Signed)
Left 2 messages for pt to call back. Awaiting return call.

## 2015-12-10 DIAGNOSIS — E1165 Type 2 diabetes mellitus with hyperglycemia: Secondary | ICD-10-CM | POA: Diagnosis not present

## 2015-12-10 DIAGNOSIS — I10 Essential (primary) hypertension: Secondary | ICD-10-CM | POA: Diagnosis not present

## 2015-12-10 DIAGNOSIS — E782 Mixed hyperlipidemia: Secondary | ICD-10-CM | POA: Diagnosis not present

## 2015-12-10 LAB — HEMOGLOBIN A1C: Hemoglobin A1C: 8.1

## 2015-12-13 DIAGNOSIS — S63634A Sprain of interphalangeal joint of right ring finger, initial encounter: Secondary | ICD-10-CM | POA: Diagnosis not present

## 2015-12-20 ENCOUNTER — Ambulatory Visit (INDEPENDENT_AMBULATORY_CARE_PROVIDER_SITE_OTHER): Payer: Medicare Other | Admitting: "Endocrinology

## 2015-12-20 ENCOUNTER — Encounter: Payer: Self-pay | Admitting: "Endocrinology

## 2015-12-20 VITALS — BP 143/78 | HR 76 | Ht 62.5 in | Wt 180.0 lb

## 2015-12-20 DIAGNOSIS — E559 Vitamin D deficiency, unspecified: Secondary | ICD-10-CM | POA: Insufficient documentation

## 2015-12-20 DIAGNOSIS — Z794 Long term (current) use of insulin: Secondary | ICD-10-CM

## 2015-12-20 DIAGNOSIS — E118 Type 2 diabetes mellitus with unspecified complications: Secondary | ICD-10-CM | POA: Diagnosis not present

## 2015-12-20 DIAGNOSIS — I10 Essential (primary) hypertension: Secondary | ICD-10-CM | POA: Diagnosis not present

## 2015-12-20 DIAGNOSIS — IMO0002 Reserved for concepts with insufficient information to code with codable children: Secondary | ICD-10-CM

## 2015-12-20 DIAGNOSIS — E1165 Type 2 diabetes mellitus with hyperglycemia: Secondary | ICD-10-CM

## 2015-12-20 MED ORDER — INSULIN LISPRO 100 UNIT/ML (KWIKPEN): 10.0000 [IU] | PEN_INJECTOR | Freq: Three times a day (TID) | SUBCUTANEOUS | Status: DC

## 2015-12-20 MED ORDER — METFORMIN HCL 500 MG PO TABS: 1000.0000 mg | ORAL_TABLET | Freq: Two times a day (BID) | ORAL | Status: DC

## 2015-12-20 MED ORDER — INSULIN DETEMIR 100 UNIT/ML FLEXPEN: 50.0000 [IU] | PEN_INJECTOR | Freq: Every day | SUBCUTANEOUS | Status: DC

## 2015-12-20 NOTE — Progress Notes (Signed)
Subjective:    Patient ID: Vicki Moon, female    DOB: Sep 01, 1951, PCP Deloria Lair, MD   Past Medical History  Diagnosis Date  . PONV (postoperative nausea and vomiting)   . Diabetes mellitus without complication (HCC)     Type 2 IDDM x 5 years  . Herniated lumbar intervertebral disc 06/2014  . Glaucoma    Past Surgical History  Procedure Laterality Date  . Cholecystectomy    . Tubal ligation    . Eye surgery    . Lumbar laminectomy Right 07/12/2014    Procedure: LUMBAR FIVE TO SACRAL ONE MICRODISCECTOMY;  Surgeon: Marybelle Killings, MD;  Location: [facility];  Service: Orthopedics;  Laterality: Right;  . Colonoscopy N/A 06/28/2015    Procedure: COLONOSCOPY;  Surgeon: Rogene Houston, MD;  [facility];  Service: Endoscopy;  Laterality: N/A;  155   Social History   Social History  . Marital Status: Married    Spouse Name: N/A  . Number of Children: N/A  . Years of Education: N/A   Social History Main Topics  . Smoking status: Current Some Day Smoker -- 1.00 packs/day for 10 years    Types: Cigarettes  . Smokeless tobacco: Never Used     Comment: quit in 2015  . Alcohol Use: No  . Drug Use: No  . Sexual Activity: Yes   Other Topics Concern  . None   Social History Narrative   Outpatient Encounter Prescriptions as of 12/20/2015  Medication Sig  . Insulin Detemir (LEVEMIR FLEXTOUCH) 100 UNIT/ML Pen Inject 50 Units into the skin at bedtime.  . insulin lispro (HUMALOG KWIKPEN) 100 UNIT/ML KiwkPen Inject 0.1-0.16 mLs (10-16 Units total) into the skin 3 (three) times daily.  Marland Kitchen lisinopril (PRINIVIL,ZESTRIL) 10 MG tablet Take 10 mg by mouth daily.  . metFORMIN (GLUCOPHAGE) 500 MG tablet Take 2 tablets (1,000 mg total) by mouth 2 (two) times daily with a meal.  . Multiple Vitamins-Minerals (PRESERVISION AREDS PO) Take by mouth 2 (two) times daily.  . [DISCONTINUED] Insulin Detemir (LEVEMIR FLEXTOUCH) 100 UNIT/ML Pen Inject 50 Units into the skin at bedtime.  .  [DISCONTINUED] insulin lispro (HUMALOG KWIKPEN) 100 UNIT/ML KiwkPen Inject 10-16 Units into the skin 3 (three) times daily.  . [DISCONTINUED] metFORMIN (GLUCOPHAGE) 500 MG tablet Take 1,000 mg by mouth 2 (two) times daily with a meal.   . latanoprost (XALATAN) 0.005 % ophthalmic solution Place 1 drop into both eyes at bedtime.  . ranitidine (ZANTAC) 150 MG tablet Take 150 mg by mouth at bedtime.  . [DISCONTINUED] dicyclomine (BENTYL) 20 MG tablet Take 1 tablet (20 mg total) by mouth 3 (three) times daily before meals.  . [DISCONTINUED] insulin detemir (LEVEMIR) 100 UNIT/ML injection Inject 35 Units into the skin at bedtime.    No facility-administered encounter medications on file as of 12/20/2015.   ALLERGIES: No Known Allergies VACCINATION STATUS:  There is no immunization history on file for this patient.  Diabetes She presents for her follow-up diabetic visit. She has type 2 diabetes mellitus. Onset time: She was diagnosed at approximate age of 22 years. Her disease course has been improving. There are no hypoglycemic associated symptoms. Pertinent negatives for hypoglycemia include no confusion, headaches, pallor or seizures. There are no diabetic associated symptoms. Pertinent negatives for diabetes include no chest pain, no polydipsia, no polyphagia and no polyuria. There are no hypoglycemic complications. Symptoms are improving. There are no diabetic complications. Risk factors for coronary artery disease include diabetes  mellitus, hypertension, sedentary lifestyle and tobacco exposure. Current diabetic treatment includes insulin injections and oral agent (monotherapy). She is compliant with treatment most of the time. Her weight is stable. She is following a generally unhealthy diet. When asked about meal planning, she reported none. She has had a previous visit with a dietitian. She never participates in exercise. Her home blood glucose trend is decreasing steadily. Her breakfast blood  glucose range is generally 180-200 mg/dl. Her lunch blood glucose range is generally 180-200 mg/dl. Her dinner blood glucose range is generally 180-200 mg/dl. Her overall blood glucose range is 180-200 mg/dl. An ACE inhibitor/angiotensin II receptor blocker is being taken.  Hypertension This is a chronic problem. The current episode started more than 1 year ago. The problem is controlled. Pertinent negatives include no chest pain, headaches, palpitations or shortness of breath. Risk factors for coronary artery disease include smoking/tobacco exposure, diabetes mellitus and sedentary lifestyle. Past treatments include ACE inhibitors. The current treatment provides moderate improvement.     Review of Systems  Constitutional: Negative for fever, chills and unexpected weight change.  HENT: Positive for sinus pressure. Negative for trouble swallowing and voice change.   Eyes: Negative for visual disturbance.  Respiratory: Negative for cough, shortness of breath and wheezing.   Cardiovascular: Negative for chest pain, palpitations and leg swelling.  Gastrointestinal: Negative for nausea, vomiting and diarrhea.  Endocrine: Negative for cold intolerance, heat intolerance, polydipsia, polyphagia and polyuria.  Musculoskeletal: Negative for myalgias and arthralgias.  Skin: Negative for color change, pallor, rash and wound.  Neurological: Negative for seizures and headaches.  Psychiatric/Behavioral: Negative for suicidal ideas and confusion.    Objective:    BP 143/78 mmHg  Pulse 76  Ht 5' 2.5" (1.588 m)  Wt 180 lb (81.647 kg)  BMI 32.38 kg/m2  SpO2 96%  Wt Readings from Last 3 Encounters:  12/20/15 180 lb (81.647 kg)  07/19/15 177 lb 3.2 oz (80.377 kg)  05/29/15 178 lb 14.4 oz (81.149 kg)    Physical Exam  Constitutional: She is oriented to person, place, and time. She appears well-developed.  HENT:  Head: Normocephalic and atraumatic.  Eyes: EOM are normal.  Neck: Normal range of motion.  Neck supple. No tracheal deviation present. No thyromegaly present.  Cardiovascular: Normal rate and regular rhythm.   Pulmonary/Chest: Effort normal and breath sounds normal.  Abdominal: Soft. Bowel sounds are normal. There is no tenderness. There is no guarding.  Musculoskeletal: Normal range of motion. She exhibits no edema.  Neurological: She is alert and oriented to person, place, and time. She has normal reflexes. No cranial nerve deficit. Coordination normal.  Skin: Skin is warm and dry. No rash noted. No erythema. No pallor.  Psychiatric: She has a normal mood and affect. Judgment normal.     CMP     Component Value Date/Time   NA 142 07/11/2014 1441   K 3.9 07/11/2014 1441   CL 101 07/11/2014 1441   CO2 25 07/11/2014 1441   GLUCOSE 152* 07/11/2014 1441   BUN 9 07/11/2014 1441   CREATININE 0.52 05/29/2015 1103   CREATININE 0.60 09/12/2014 0848   CALCIUM 9.3 07/11/2014 1441   PROT 7.3 07/11/2014 1441   ALBUMIN 3.8 07/11/2014 1441   AST 14 07/11/2014 1441   ALT 18 07/11/2014 1441   ALKPHOS 113 07/11/2014 1441   BILITOT 0.2* 07/11/2014 1441   GFRNONAA >90 07/11/2014 1441   GFRAA >90 07/11/2014 1441     Diabetic Labs (most recent): Lab Results  Component  Value Date   HGBA1C 8.1 12/10/2015   HGBA1C 9.8 09/03/2015   Assessment & Plan:   1. Uncontrolled type 2 diabetes mellitus with complication, with long-term current use of insulin ([place]) - patient remains at a high risk for more acute and chronic complications of diabetes which include CAD, CVA, CKD, retinopathy, and neuropathy. These are all discussed in detail with the patient.  Patient came with improved glucose profile, and  recent A1c of 8.1% down from 9.8 %.  Glucose logs and insulin administration records pertaining to this visit,  to be scanned into patient's records.  Recent labs reviewed.   - I have re-counseled the patient on diet management  by adopting a carbohydrate restricted / protein rich   Diet.  - Suggestion is made for patient to avoid simple carbohydrates   from their diet including Cakes , Desserts, Ice Cream,  Soda (  diet and regular) , Sweet Tea , Candies,  Chips, Cookies, Artificial Sweeteners,   and "Sugar-free" Products .  This will help patient to have stable blood glucose profile and potentially avoid unintended  Weight gain.  - Patient is advised to stick to a routine mealtimes to eat 3 meals  a day and avoid unnecessary snacks ( to snack only to correct hypoglycemia).  - The patient  has been  scheduled with Jearld Fenton, RDN, CDE for individualized DM education.  - I have approached patient with the following individualized plan to manage diabetes and patient agrees.  - I will proceed with basal insulin Levemir 50 units QHS, and prandial insulin Humalog 10 units TIDAC for pre-meal BG readings of 90-150mg /dl, plus patient specific correction dose of rapid acting insulin  for unexpected hyperglycemia above 150mg /dl, associated with strict monitoring of glucose  AC and HS. - Patient is warned not to take insulin without proper monitoring per orders. -Adjustment parameters are given for hypo and hyperglycemia in writing. -Patient is encouraged to call clinic for blood glucose levels less than 70 or above 300 mg /dl. - I will continue metformin 500 mg by mouth twice a day, therapeutically suitable for patient. -I will order anti-pancreatic antibodies to properly classify her diabetes.  - She is not suitable candidate for incretin therapy for now - Patient specific target  for A1c; LDL, HDL, Triglycerides, and  Waist Circumference were discussed in detail.  2) BP/HTN: Controlled . Continue current medications including ACEI/ARB. 3) Lipids/HPL:  she is not on statins, I would obtain fasting lipid panel before her next visit.  4)  Weight/Diet: CDE consult in progress, exercise, and carbohydrates information provided. 5. Vitamin D deficiency; she is status post vitamin D  replacement. 6) Chronic Care/Health Maintenance:  -Patient  is on ACEI/ARB medications and encouraged to continue to follow up with Ophthalmology, Podiatrist at least yearly or according to recommendations, and advised to quit smoking. I have recommended yearly flu vaccine and pneumonia vaccination at least every 5 years; moderate intensity exercise for up to 150 minutes weekly; and  sleep for at least 7 hours a day.  - 25 minutes of time was spent on the care of this patient , 50% of which was applied for counseling on diabetes complications and their preventions.  - I advised patient to maintain close follow up with TAPPER,DAVID B, MD for primary care needs.  Patient is asked to bring meter and  blood glucose logs during their next visit.   Follow up plan: -Return in about 3 months (around 03/19/2016) for diabetes, high blood pressure,  follow up with pre-visit labs, meter, and logs.  Glade Lloyd, MD Phone: [phone]  Fax: [phone]   12/20/2015, 3:40 PM

## 2015-12-20 NOTE — Patient Instructions (Signed)

## 2016-01-07 DIAGNOSIS — E1165 Type 2 diabetes mellitus with hyperglycemia: Secondary | ICD-10-CM | POA: Diagnosis not present

## 2016-01-07 DIAGNOSIS — R197 Diarrhea, unspecified: Secondary | ICD-10-CM | POA: Diagnosis not present

## 2016-01-07 DIAGNOSIS — I10 Essential (primary) hypertension: Secondary | ICD-10-CM | POA: Diagnosis not present

## 2016-01-07 DIAGNOSIS — Z794 Long term (current) use of insulin: Secondary | ICD-10-CM | POA: Diagnosis not present

## 2016-01-09 ENCOUNTER — Ambulatory Visit (INDEPENDENT_AMBULATORY_CARE_PROVIDER_SITE_OTHER): Payer: Medicare Other | Admitting: Internal Medicine

## 2016-01-09 ENCOUNTER — Encounter (INDEPENDENT_AMBULATORY_CARE_PROVIDER_SITE_OTHER): Payer: Self-pay | Admitting: Internal Medicine

## 2016-01-09 VITALS — BP 94/50 | HR 65 | Temp 97.8°F | Ht 62.0 in | Wt 177.9 lb

## 2016-01-09 DIAGNOSIS — R197 Diarrhea, unspecified: Secondary | ICD-10-CM | POA: Diagnosis not present

## 2016-01-09 NOTE — Patient Instructions (Addendum)
Am going to give her samples of IBGARD and see how she does. She will cal with a PR report.  May also take Imodium

## 2016-01-09 NOTE — Progress Notes (Signed)
Subjective:    Patient ID: Vicki Moon, female    DOB: 20-Jun-1951, 65 y.o.   MRN: [id number]  HPI  Here today for f/u. She was last seen in July by Dr. Laural Golden for diarrhea. She tells me she still has diarrhea.  She has had diarrhea for years. She says she has had diarrhea ever since she became a diabetic. She is on average She tells me as soon as she eats, she has to go the the bathroom. She is having 5-6 stools a day.  She tried the Dicyclomine which really did not help. She takes Imodium every other day. She says she has bloating and gas.  Her appetite is good. No weight loss.  No melena or BRRB. Her colonoscopy in 2016 was normal and negative for microscopic colitis.     06/28/2015 Colonoscopy  Indications: Patient is 43 old Caucasian female who presents with 2 month history of diarrhea. She was also noted to have heme-positive stool. Patient has been on metformin for few years. Dose reduction recently did not ameliorate her diarrhea. She has never been screened for CRC. Family history is negative for CRC. Pancolonic diverticulosis. Three polyps or hot snared and submitted together. These range in size from 7-10 mm. Clip applied for hemostasis 21 polypectomy site. 12 mm polyp hot snare from distal sigmoid colon and submitted with a smaller polyp which is also hot snared. Clip applied to polypectomy site for hemostasis. Random biopsies taken from mucosa of sigmoid colon looking for microscopic or collagenous colitis. Three small rectal polyps or hot snared and submitted together. External hemorrhoids.  All polyps are hyperplastic. Sigmoid colon biopsy negative for microscopic or collagenous colitis. Patient feels no better with dicyclomine 10 mg 3 times a day. Patient advised to increase dicyclomine to 20 mg before each meal.  Review of Systems Past Medical History  Diagnosis Date  . PONV (postoperative nausea and vomiting)   . Diabetes mellitus without complication  (HCC)     Type 2 IDDM x 5 years  . Herniated lumbar intervertebral disc 06/2014  . Glaucoma     Past Surgical History  Procedure Laterality Date  . Cholecystectomy    . Tubal ligation    . Eye surgery    . Lumbar laminectomy Right 07/12/2014    Procedure: LUMBAR FIVE TO SACRAL ONE MICRODISCECTOMY;  Surgeon: Marybelle Killings, MD;  Location: [facility];  Service: Orthopedics;  Laterality: Right;  . Colonoscopy N/A 06/28/2015    Procedure: COLONOSCOPY;  Surgeon: Rogene Houston, MD;  [facility];  Service: Endoscopy;  Laterality: N/A;  155    No Known Allergies  Current Outpatient Prescriptions on File Prior to Visit  Medication Sig Dispense Refill  . Insulin Detemir (LEVEMIR FLEXTOUCH) 100 UNIT/ML Pen Inject 50 Units into the skin at bedtime. 15 mL 2  . insulin lispro (HUMALOG KWIKPEN) 100 UNIT/ML KiwkPen Inject 0.1-0.16 mLs (10-16 Units total) into the skin 3 (three) times daily. (Patient taking differently: Inject 10 Units into the skin 3 (three) times daily. ) 15 mL 2  . latanoprost (XALATAN) 0.005 % ophthalmic solution Place 1 drop into both eyes at bedtime.    Marland Kitchen lisinopril (PRINIVIL,ZESTRIL) 10 MG tablet Take 10 mg by mouth daily.    . metFORMIN (GLUCOPHAGE) 500 MG tablet Take 2 tablets (1,000 mg total) by mouth 2 (two) times daily with a meal. 60 tablet 2  . Multiple Vitamins-Minerals (PRESERVISION AREDS PO) Take by mouth 2 (two) times daily.  No current facility-administered medications on file prior to visit.        Objective:   Physical Exam Blood pressure 94/50, pulse 65, temperature 97.8 F (36.6 C), height 5\' 2"  (1.575 m), weight 177 lb 14.4 oz (80.695 kg). Alert and oriented. Skin warm and dry. Oral mucosa is moist.   . Sclera anicteric, conjunctivae is pink. Thyroid not enlarged. No cervical lymphadenopathy. Lungs clear. Heart regular rate and rhythm.  Abdomen is soft. Bowel sounds are positive. No hepatomegaly. No abdominal masses felt. No tenderness.  No edema to  lower extremities.         Assessment & Plan:  Diarrhea probably IBS.  Am going to try her IBGARD and she if this helps.  She will have OV in 6 months.

## 2016-01-15 DIAGNOSIS — H401132 Primary open-angle glaucoma, bilateral, moderate stage: Secondary | ICD-10-CM | POA: Diagnosis not present

## 2016-01-22 ENCOUNTER — Ambulatory Visit (INDEPENDENT_AMBULATORY_CARE_PROVIDER_SITE_OTHER): Payer: Medicare Other | Admitting: Internal Medicine

## 2016-03-13 ENCOUNTER — Other Ambulatory Visit: Payer: Self-pay | Admitting: "Endocrinology

## 2016-03-13 DIAGNOSIS — E1165 Type 2 diabetes mellitus with hyperglycemia: Secondary | ICD-10-CM | POA: Diagnosis not present

## 2016-03-13 DIAGNOSIS — Z794 Long term (current) use of insulin: Secondary | ICD-10-CM | POA: Diagnosis not present

## 2016-03-13 DIAGNOSIS — E118 Type 2 diabetes mellitus with unspecified complications: Secondary | ICD-10-CM | POA: Diagnosis not present

## 2016-03-13 LAB — LIPID PANEL
Cholesterol: 158 mg/dL (ref 125–200)
HDL: 48 mg/dL (ref >=46)
LDL Cholesterol: 79 mg/dL (ref <130)
TRIGLYCERIDES: 156 mg/dL — AB (ref <150)
Total CHOL/HDL Ratio: 3.3 Ratio (ref <=5.0)
VLDL: 31 mg/dL — AB (ref <30)

## 2016-03-13 LAB — BASIC METABOLIC PANEL
BUN: 10 mg/dL (ref 7–25)
CO2: 29 mmol/L (ref 20–31)
Calcium: 9.8 mg/dL (ref 8.6–10.4)
Chloride: 103 mmol/L (ref 98–110)
Creat: 0.63 mg/dL (ref 0.50–0.99)
GLUCOSE: 133 mg/dL — AB (ref 65–99)
POTASSIUM: 4.2 mmol/L (ref 3.5–5.3)
SODIUM: 141 mmol/L (ref 135–146)

## 2016-03-13 LAB — MICROALBUMIN / CREATININE URINE RATIO
Creatinine, Urine: 70 mg/dL (ref 20–320)
Microalb Creat Ratio: 3 mcg/mg creat (ref <30)
Microalb, Ur: 0.2 mg/dL

## 2016-03-13 LAB — T4, FREE: FREE T4: 1.3 ng/dL (ref 0.8–1.8)

## 2016-03-13 LAB — HEMOGLOBIN A1C
Hgb A1c MFr Bld: 8.7 % — ABNORMAL HIGH (ref <5.7)
Mean Plasma Glucose: 203 mg/dL — ABNORMAL HIGH (ref <117)

## 2016-03-13 LAB — TSH: TSH: 1.83 mIU/L

## 2016-03-17 LAB — GLUTAMIC ACID DECARBOXYLASE AUTO ABS

## 2016-03-18 LAB — ANTI-ISLET CELL ANTIBODY: Pancreatic Islet Cell Antibody: <5 JDF Units (ref <5)

## 2016-03-21 ENCOUNTER — Ambulatory Visit: Payer: Medicare Other | Admitting: "Endocrinology

## 2016-03-24 ENCOUNTER — Ambulatory Visit (INDEPENDENT_AMBULATORY_CARE_PROVIDER_SITE_OTHER): Payer: Medicare Other | Admitting: "Endocrinology

## 2016-03-24 ENCOUNTER — Encounter: Payer: Self-pay | Admitting: "Endocrinology

## 2016-03-24 VITALS — BP 120/68 | HR 85 | Ht 62.0 in | Wt 184.0 lb

## 2016-03-24 DIAGNOSIS — E1165 Type 2 diabetes mellitus with hyperglycemia: Secondary | ICD-10-CM | POA: Diagnosis not present

## 2016-03-24 DIAGNOSIS — E118 Type 2 diabetes mellitus with unspecified complications: Secondary | ICD-10-CM | POA: Diagnosis not present

## 2016-03-24 DIAGNOSIS — I10 Essential (primary) hypertension: Secondary | ICD-10-CM | POA: Diagnosis not present

## 2016-03-24 DIAGNOSIS — E559 Vitamin D deficiency, unspecified: Secondary | ICD-10-CM | POA: Diagnosis not present

## 2016-03-24 DIAGNOSIS — IMO0002 Reserved for concepts with insufficient information to code with codable children: Secondary | ICD-10-CM

## 2016-03-24 DIAGNOSIS — Z794 Long term (current) use of insulin: Secondary | ICD-10-CM

## 2016-03-24 NOTE — Progress Notes (Signed)
Subjective:    Patient ID: Vicki Moon, female    DOB: 03-28-1951, PCP Deloria Lair, MD   Past Medical History  Diagnosis Date  . PONV (postoperative nausea and vomiting)   . Diabetes mellitus without complication (HCC)     Type 2 IDDM x 5 years  . Herniated lumbar intervertebral disc 06/2014  . Glaucoma    Past Surgical History  Procedure Laterality Date  . Cholecystectomy    . Tubal ligation    . Eye surgery    . Lumbar laminectomy Right 07/12/2014    Procedure: LUMBAR FIVE TO SACRAL ONE MICRODISCECTOMY;  Surgeon: Marybelle Killings, MD;  Location: [place];  Service: Orthopedics;  Laterality: Right;  . Colonoscopy N/A 06/28/2015    Procedure: COLONOSCOPY;  Surgeon: Rogene Houston, MD;  [facility];  Service: Endoscopy;  Laterality: N/A;  155   Social History   Social History  . Marital Status: Married    Spouse Name: N/A  . Number of Children: N/A  . Years of Education: N/A   Social History Main Topics  . Smoking status: Current Some Day Smoker -- 1.00 packs/day for 10 years    Types: Cigarettes  . Smokeless tobacco: Never Used     Comment:  1/2 pack a day since age 31  . Alcohol Use: No  . Drug Use: No  . Sexual Activity: Yes   Other Topics Concern  . None   Social History Narrative   Outpatient Encounter Prescriptions as of 03/24/2016  Medication Sig  . aspirin 81 MG tablet Take 81 mg by mouth daily.  . Insulin Detemir (LEVEMIR FLEXTOUCH) 100 UNIT/ML Pen Inject 50 Units into the skin at bedtime.  . insulin lispro (HUMALOG KWIKPEN) 100 UNIT/ML KiwkPen Inject 0.1-0.16 mLs (10-16 Units total) into the skin 3 (three) times daily. (Patient taking differently: Inject 11-17 Units into the skin 3 (three) times daily. )  . latanoprost (XALATAN) 0.005 % ophthalmic solution Place 1 drop into both eyes at bedtime.  Marland Kitchen lisinopril (PRINIVIL,ZESTRIL) 10 MG tablet Take 10 mg by mouth daily.  . Multiple Vitamins-Minerals (PRESERVISION AREDS PO) Take by mouth 2 (two)  times daily.  . [DISCONTINUED] metFORMIN (GLUCOPHAGE) 500 MG tablet Take 2 tablets (1,000 mg total) by mouth 2 (two) times daily with a meal.  . [DISCONTINUED] metFORMIN (GLUCOPHAGE) 500 MG tablet Take 500 mg by mouth 2 (two) times daily with a meal.   No facility-administered encounter medications on file as of 03/24/2016.   ALLERGIES: No Known Allergies VACCINATION STATUS:  There is no immunization history on file for this patient.  Diabetes She presents for her follow-up diabetic visit. She has type 2 diabetes mellitus. Onset time: She was diagnosed at approximate age of 65 years. Her disease course has been stable. There are no hypoglycemic associated symptoms. Pertinent negatives for hypoglycemia include no confusion, headaches, pallor or seizures. There are no diabetic associated symptoms. Pertinent negatives for diabetes include no chest pain, no polydipsia, no polyphagia and no polyuria. There are no hypoglycemic complications. Symptoms are stable. There are no diabetic complications. Risk factors for coronary artery disease include diabetes mellitus, hypertension, sedentary lifestyle and tobacco exposure. Current diabetic treatment includes insulin injections and oral agent (monotherapy). She is compliant with treatment most of the time. Her weight is stable. She is following a generally unhealthy diet. When asked about meal planning, she reported none. She has had a previous visit with a dietitian. She never participates in exercise. Her home  blood glucose trend is decreasing steadily. Her breakfast blood glucose range is generally 140-180 mg/dl. Her lunch blood glucose range is generally 180-200 mg/dl. Her dinner blood glucose range is generally 180-200 mg/dl. Her overall blood glucose range is 180-200 mg/dl. An ACE inhibitor/angiotensin II receptor blocker is being taken.  Hypertension This is a chronic problem. The current episode started more than 1 year ago. The problem is controlled.  Pertinent negatives include no chest pain, headaches, palpitations or shortness of breath. Risk factors for coronary artery disease include smoking/tobacco exposure, diabetes mellitus and sedentary lifestyle. Past treatments include ACE inhibitors. The current treatment provides moderate improvement.     Review of Systems  Constitutional: Negative for fever, chills and unexpected weight change.  HENT: Positive for sinus pressure. Negative for trouble swallowing and voice change.   Eyes: Negative for visual disturbance.  Respiratory: Negative for cough, shortness of breath and wheezing.   Cardiovascular: Negative for chest pain, palpitations and leg swelling.  Gastrointestinal: Negative for nausea, vomiting and diarrhea.  Endocrine: Negative for cold intolerance, heat intolerance, polydipsia, polyphagia and polyuria.  Musculoskeletal: Negative for myalgias and arthralgias.  Skin: Negative for color change, pallor, rash and wound.  Neurological: Negative for seizures and headaches.  Psychiatric/Behavioral: Negative for suicidal ideas and confusion.    Objective:    BP 120/68 mmHg  Pulse 85  Ht 5\' 2"  (1.575 m)  Wt 184 lb (83.462 kg)  BMI 33.65 kg/m2  SpO2 96%  Wt Readings from Last 3 Encounters:  03/24/16 184 lb (83.462 kg)  01/09/16 177 lb 14.4 oz (80.695 kg)  12/20/15 180 lb (81.647 kg)    Physical Exam  Constitutional: She is oriented to person, place, and time. She appears well-developed.  HENT:  Head: Normocephalic and atraumatic.  Eyes: EOM are normal.  Neck: Normal range of motion. Neck supple. No tracheal deviation present. No thyromegaly present.  Cardiovascular: Normal rate and regular rhythm.   Pulmonary/Chest: Effort normal and breath sounds normal.  Abdominal: Soft. Bowel sounds are normal. There is no tenderness. There is no guarding.  Musculoskeletal: Normal range of motion. She exhibits no edema.  Neurological: She is alert and oriented to person, place, and  time. She has normal reflexes. No cranial nerve deficit. Coordination normal.  Skin: Skin is warm and dry. No rash noted. No erythema. No pallor.  Psychiatric: She has a normal mood and affect. Judgment normal.     CMP     Component Value Date/Time   NA 141 03/13/2016 0936   K 4.2 03/13/2016 0936   CL 103 03/13/2016 0936   CO2 29 03/13/2016 0936   GLUCOSE 133* 03/13/2016 0936   BUN 10 03/13/2016 0936   CREATININE 0.63 03/13/2016 0936   CREATININE 0.60 09/12/2014 0848   CALCIUM 9.8 03/13/2016 0936   PROT 7.3 07/11/2014 1441   ALBUMIN 3.8 07/11/2014 1441   AST 14 07/11/2014 1441   ALT 18 07/11/2014 1441   ALKPHOS 113 07/11/2014 1441   BILITOT 0.2* 07/11/2014 1441   GFRNONAA >90 07/11/2014 1441   GFRAA >90 07/11/2014 1441     Diabetic Labs (most recent): Lab Results  Component Value Date   HGBA1C 8.7* 03/13/2016   HGBA1C 8.1 12/10/2015   HGBA1C 9.8 09/03/2015   Assessment & Plan:   1. Uncontrolled type 2 diabetes mellitus with complication, with long-term current use of insulin ([place]) - patient remains at a high risk for more acute and chronic complications of diabetes which include CAD, CVA, CKD, retinopathy, and neuropathy. These  are all discussed in detail with the patient.  Patient came with improved glucose profile, and  recent A1c 8.7%, overall improving from 9.8 %.  Glucose logs and insulin administration records pertaining to this visit,  to be scanned into patient's records.  Recent labs reviewed.   - I have re-counseled the patient on diet management  by adopting a carbohydrate restricted / protein rich  Diet.  - Suggestion is made for patient to avoid simple carbohydrates   from their diet including Cakes , Desserts, Ice Cream,  Soda (  diet and regular) , Sweet Tea , Candies,  Chips, Cookies, Artificial Sweeteners,   and "Sugar-free" Products .  This will help patient to have stable blood glucose profile and potentially avoid unintended  Weight gain.  - Patient  is advised to stick to a routine mealtimes to eat 3 meals  a day and avoid unnecessary snacks ( to snack only to correct hypoglycemia).  - The patient  has been  scheduled with Jearld Fenton, RDN, CDE for individualized DM education.  - I have approached patient with the following individualized plan to manage diabetes and patient agrees.  - I will proceed with basal insulin Levemir 50 units QHS, and prandial insulin Humalog 10 units TIDAC for pre-meal BG readings of 90-150mg /dl, plus patient specific correction dose of rapid acting insulin  for unexpected hyperglycemia above 150mg /dl, associated with strict monitoring of glucose  AC and HS. - Patient is warned not to take insulin without proper monitoring per orders. -Adjustment parameters are given for hypo and hyperglycemia in writing. -Patient is encouraged to call clinic for blood glucose levels less than 70 or above 300 mg /dl. - She continues to have diarrhea, I advised her to hold metformin for 2 weeks and observe her body. If diarrhea disappears I advised her to stay off of metformin.  - Anti-islet cell antibodies undetectable.  - She is not suitable candidate for incretin therapy for now - Patient specific target  for A1c; LDL, HDL, Triglycerides, and  Waist Circumference were discussed in detail.  2) BP/HTN: Controlled . Continue current medications including ACEI/ARB. 3) Lipids/HPL:  she is not on statins, I would obtain fasting lipid panel before her next visit.  4)  Weight/Diet: CDE consult in progress, exercise, and carbohydrates information provided. 5. Vitamin D deficiency; she is status post vitamin D replacement. 6) Chronic Care/Health Maintenance:  -Patient  is on ACEI/ARB medications and encouraged to continue to follow up with Ophthalmology, Podiatrist at least yearly or according to recommendations, and advised to quit smoking. I have recommended yearly flu vaccine and pneumonia vaccination at least every 5 years;  moderate intensity exercise for up to 150 minutes weekly; and  sleep for at least 7 hours a day.  - 25 minutes of time was spent on the care of this patient , 50% of which was applied for counseling on diabetes complications and their preventions.  - I advised patient to maintain close follow up with TAPPER,DAVID B, MD for primary care needs.  Patient is asked to bring meter and  blood glucose logs during their next visit.   Follow up plan: -Return in about 3 months (around 06/23/2016) for diabetes, high blood pressure, high cholesterol, follow up with pre-visit labs, meter, and logs.  Glade Lloyd, MD Phone: [phone]  Fax: [phone]   03/24/2016, 3:20 PM

## 2016-03-25 ENCOUNTER — Other Ambulatory Visit: Payer: Self-pay | Admitting: "Endocrinology

## 2016-04-10 DIAGNOSIS — R928 Other abnormal and inconclusive findings on diagnostic imaging of breast: Secondary | ICD-10-CM | POA: Diagnosis not present

## 2016-04-10 DIAGNOSIS — Z1231 Encounter for screening mammogram for malignant neoplasm of breast: Secondary | ICD-10-CM | POA: Diagnosis not present

## 2016-04-23 DIAGNOSIS — R928 Other abnormal and inconclusive findings on diagnostic imaging of breast: Secondary | ICD-10-CM | POA: Diagnosis not present

## 2016-04-23 DIAGNOSIS — N6489 Other specified disorders of breast: Secondary | ICD-10-CM | POA: Diagnosis not present

## 2016-04-24 DIAGNOSIS — H401122 Primary open-angle glaucoma, left eye, moderate stage: Secondary | ICD-10-CM | POA: Diagnosis not present

## 2016-04-24 DIAGNOSIS — H353134 Nonexudative age-related macular degeneration, bilateral, advanced atrophic with subfoveal involvement: Secondary | ICD-10-CM | POA: Diagnosis not present

## 2016-04-24 DIAGNOSIS — H401112 Primary open-angle glaucoma, right eye, moderate stage: Secondary | ICD-10-CM | POA: Diagnosis not present

## 2016-06-16 ENCOUNTER — Other Ambulatory Visit: Payer: Self-pay | Admitting: "Endocrinology

## 2016-06-16 DIAGNOSIS — Z794 Long term (current) use of insulin: Secondary | ICD-10-CM | POA: Diagnosis not present

## 2016-06-16 DIAGNOSIS — E1165 Type 2 diabetes mellitus with hyperglycemia: Secondary | ICD-10-CM | POA: Diagnosis not present

## 2016-06-16 DIAGNOSIS — E118 Type 2 diabetes mellitus with unspecified complications: Secondary | ICD-10-CM | POA: Diagnosis not present

## 2016-06-16 LAB — BASIC METABOLIC PANEL
BUN: 10 mg/dL (ref 7–25)
CO2: 27 mmol/L (ref 20–31)
CREATININE: 0.48 mg/dL — AB (ref 0.50–0.99)
Calcium: 9 mg/dL (ref 8.6–10.4)
Chloride: 105 mmol/L (ref 98–110)
Glucose, Bld: 122 mg/dL — ABNORMAL HIGH (ref 65–99)
Potassium: 4.2 mmol/L (ref 3.5–5.3)
SODIUM: 140 mmol/L (ref 135–146)

## 2016-06-16 LAB — HEMOGLOBIN A1C
HEMOGLOBIN A1C: 8.8 % — AB (ref <5.7)
MEAN PLASMA GLUCOSE: 206 mg/dL

## 2016-06-19 ENCOUNTER — Other Ambulatory Visit: Payer: Self-pay | Admitting: "Endocrinology

## 2016-06-19 DIAGNOSIS — H401112 Primary open-angle glaucoma, right eye, moderate stage: Secondary | ICD-10-CM | POA: Diagnosis not present

## 2016-06-19 DIAGNOSIS — H401122 Primary open-angle glaucoma, left eye, moderate stage: Secondary | ICD-10-CM | POA: Diagnosis not present

## 2016-06-23 ENCOUNTER — Encounter: Payer: Self-pay | Admitting: "Endocrinology

## 2016-06-23 ENCOUNTER — Ambulatory Visit (INDEPENDENT_AMBULATORY_CARE_PROVIDER_SITE_OTHER): Payer: Medicare Other | Admitting: "Endocrinology

## 2016-06-23 VITALS — BP 131/75 | HR 73 | Ht 62.0 in | Wt 184.0 lb

## 2016-06-23 DIAGNOSIS — I10 Essential (primary) hypertension: Secondary | ICD-10-CM | POA: Diagnosis not present

## 2016-06-23 DIAGNOSIS — Z794 Long term (current) use of insulin: Secondary | ICD-10-CM

## 2016-06-23 DIAGNOSIS — E1165 Type 2 diabetes mellitus with hyperglycemia: Secondary | ICD-10-CM

## 2016-06-23 DIAGNOSIS — E118 Type 2 diabetes mellitus with unspecified complications: Secondary | ICD-10-CM | POA: Diagnosis not present

## 2016-06-23 DIAGNOSIS — IMO0002 Reserved for concepts with insufficient information to code with codable children: Secondary | ICD-10-CM

## 2016-06-23 NOTE — Progress Notes (Signed)
Subjective:    Patient ID: Vicki Moon, female    DOB: 11-30-1951, PCP Deloria Lair, MD   Past Medical History  Diagnosis Date  . PONV (postoperative nausea and vomiting)   . Diabetes mellitus without complication (HCC)     Type 2 IDDM x 5 years  . Herniated lumbar intervertebral disc 06/2014  . Glaucoma    Past Surgical History  Procedure Laterality Date  . Cholecystectomy    . Tubal ligation    . Eye surgery    . Lumbar laminectomy Right 07/12/2014    Procedure: LUMBAR FIVE TO SACRAL ONE MICRODISCECTOMY;  Surgeon: Marybelle Killings, MD;  Location: [place];  Service: Orthopedics;  Laterality: Right;  . Colonoscopy N/A 06/28/2015    Procedure: COLONOSCOPY;  Surgeon: Rogene Houston, MD;  [facility];  Service: Endoscopy;  Laterality: N/A;  155   Social History   Social History  . Marital Status: Married    Spouse Name: N/A  . Number of Children: N/A  . Years of Education: N/A   Social History Main Topics  . Smoking status: Current Some Day Smoker -- 1.00 packs/day for 10 years    Types: Cigarettes  . Smokeless tobacco: Never Used     Comment:  1/2 pack a day since age 56  . Alcohol Use: No  . Drug Use: No  . Sexual Activity: Yes   Other Topics Concern  . None   Social History Narrative   Outpatient Encounter Prescriptions as of 06/23/2016  Medication Sig  . Insulin Detemir (LEVEMIR FLEXTOUCH [place]) Inject 60 Units into the skin at bedtime.  Marland Kitchen aspirin 81 MG tablet Take 81 mg by mouth daily.  . insulin lispro (HUMALOG KWIKPEN) 100 UNIT/ML KiwkPen Inject 0.1-0.16 mLs (10-16 Units total) into the skin 3 (three) times daily. (Patient taking differently: Inject 12-18 Units into the skin 3 (three) times daily. )  . latanoprost (XALATAN) 0.005 % ophthalmic solution Place 1 drop into both eyes at bedtime.  Marland Kitchen lisinopril (PRINIVIL,ZESTRIL) 10 MG tablet TAKE ONE TABLET BY MOUTH ONCE DAILY  . Multiple Vitamins-Minerals (PRESERVISION AREDS PO) Take by mouth 2 (two)  times daily.  . [DISCONTINUED] LEVEMIR FLEXTOUCH 100 UNIT/ML Pen INJECT 50 UNITS SUBCUTANEOUSLY AT BEDTIME   No facility-administered encounter medications on file as of 06/23/2016.   ALLERGIES: No Known Allergies VACCINATION STATUS:  There is no immunization history on file for this patient.  Diabetes She presents for her follow-up diabetic visit. She has type 2 diabetes mellitus. Onset time: She was diagnosed at approximate age of 62 years. Her disease course has been stable. There are no hypoglycemic associated symptoms. Pertinent negatives for hypoglycemia include no confusion, headaches, pallor or seizures. There are no diabetic associated symptoms. Pertinent negatives for diabetes include no chest pain, no polydipsia, no polyphagia and no polyuria. There are no hypoglycemic complications. Symptoms are stable. There are no diabetic complications. Risk factors for coronary artery disease include diabetes mellitus, hypertension, sedentary lifestyle and tobacco exposure. Current diabetic treatment includes insulin injections and oral agent (monotherapy). She is compliant with treatment most of the time. Her weight is stable. She is following a generally unhealthy diet. When asked about meal planning, she reported none. She has had a previous visit with a dietitian. She never participates in exercise. Her home blood glucose trend is decreasing steadily. Her breakfast blood glucose range is generally 140-180 mg/dl. Her lunch blood glucose range is generally 180-200 mg/dl. Her dinner blood glucose range is  generally 180-200 mg/dl. Her overall blood glucose range is 180-200 mg/dl. An ACE inhibitor/angiotensin II receptor blocker is being taken.  Hypertension This is a chronic problem. The current episode started more than 1 year ago. The problem is controlled. Pertinent negatives include no chest pain, headaches, palpitations or shortness of breath. Risk factors for coronary artery disease include  smoking/tobacco exposure, diabetes mellitus and sedentary lifestyle. Past treatments include ACE inhibitors. The current treatment provides moderate improvement.     Review of Systems  Constitutional: Negative for fever, chills and unexpected weight change.  HENT: Positive for sinus pressure. Negative for trouble swallowing and voice change.   Eyes: Negative for visual disturbance.  Respiratory: Negative for cough, shortness of breath and wheezing.   Cardiovascular: Negative for chest pain, palpitations and leg swelling.  Gastrointestinal: Negative for nausea, vomiting and diarrhea.  Endocrine: Negative for cold intolerance, heat intolerance, polydipsia, polyphagia and polyuria.  Musculoskeletal: Negative for myalgias and arthralgias.  Skin: Negative for color change, pallor, rash and wound.  Neurological: Negative for seizures and headaches.  Psychiatric/Behavioral: Negative for suicidal ideas and confusion.    Objective:    BP 131/75 mmHg  Pulse 73  Ht 5\' 2"  (1.575 m)  Wt 184 lb (83.462 kg)  BMI 33.65 kg/m2  Wt Readings from Last 3 Encounters:  06/23/16 184 lb (83.462 kg)  03/24/16 184 lb (83.462 kg)  01/09/16 177 lb 14.4 oz (80.695 kg)    Physical Exam  Constitutional: She is oriented to person, place, and time. She appears well-developed.  HENT:  Head: Normocephalic and atraumatic.  Eyes: EOM are normal.  Neck: Normal range of motion. Neck supple. No tracheal deviation present. No thyromegaly present.  Cardiovascular: Normal rate and regular rhythm.   Pulmonary/Chest: Effort normal and breath sounds normal.  Abdominal: Soft. Bowel sounds are normal. There is no tenderness. There is no guarding.  Musculoskeletal: Normal range of motion. She exhibits no edema.  Neurological: She is alert and oriented to person, place, and time. She has normal reflexes. No cranial nerve deficit. Coordination normal.  Skin: Skin is warm and dry. No rash noted. No erythema. No pallor.   Psychiatric: She has a normal mood and affect. Judgment normal.     CMP     Component Value Date/Time   NA 140 06/16/2016 0953   K 4.2 06/16/2016 0953   CL 105 06/16/2016 0953   CO2 27 06/16/2016 0953   GLUCOSE 122* 06/16/2016 0953   BUN 10 06/16/2016 0953   CREATININE 0.48* 06/16/2016 0953   CREATININE 0.60 09/12/2014 0848   CALCIUM 9.0 06/16/2016 0953   PROT 7.3 07/11/2014 1441   ALBUMIN 3.8 07/11/2014 1441   AST 14 07/11/2014 1441   ALT 18 07/11/2014 1441   ALKPHOS 113 07/11/2014 1441   BILITOT 0.2* 07/11/2014 1441   GFRNONAA >90 07/11/2014 1441   GFRAA >90 07/11/2014 1441     Diabetic Labs (most recent): Lab Results  Component Value Date   HGBA1C 8.8* 06/16/2016   HGBA1C 8.7* 03/13/2016   HGBA1C 8.1 12/10/2015   Assessment & Plan:   1. Uncontrolled type 2 diabetes mellitus with complication, with long-term current use of insulin ([place]) - patient remains at a high risk for more acute and chronic complications of diabetes which include CAD, CVA, CKD, retinopathy, and neuropathy. These are all discussed in detail with the patient.  Patient came with stable glucose profile, and  recent A1c 8.8%, overall improving from 9.8 %.  Glucose logs and insulin administration records pertaining  to this visit,  to be scanned into patient's records.  Recent labs reviewed.   - I have re-counseled the patient on diet management  by adopting a carbohydrate restricted / protein rich  Diet.  - Suggestion is made for patient to avoid simple carbohydrates   from their diet including Cakes , Desserts, Ice Cream,  Soda (  diet and regular) , Sweet Tea , Candies,  Chips, Cookies, Artificial Sweeteners,   and "Sugar-free" Products .  This will help patient to have stable blood glucose profile and potentially avoid unintended  Weight gain.  - Patient is advised to stick to a routine mealtimes to eat 3 meals  a day and avoid unnecessary snacks ( to snack only to correct hypoglycemia).  - The  patient  has been  scheduled with Jearld Fenton, RDN, CDE for individualized DM education.  - I have approached patient with the following individualized plan to manage diabetes and patient agrees.  - I will increase basal insulin Levemir  To 60 units QHS, and prandial insulin Humalog 12 units TIDAC for pre-meal BG readings of 90-150mg /dl, plus patient specific correction dose of rapid acting insulin  for unexpected hyperglycemia above 150mg /dl, associated with strict monitoring of glucose  AC and HS. - Patient is warned not to take insulin without proper monitoring per orders. -Adjustment parameters are given for hypo and hyperglycemia in writing. -Patient is encouraged to call clinic for blood glucose levels less than 70 or above 300 mg /dl. - She continues to have diarrhea, I advised her to hold metformin for 2 weeks and observe her body. If diarrhea disappears I advised her to stay off of metformin.  - Anti-islet cell antibodies undetectable.  - She is not suitable candidate for incretin therapy for now - Patient specific target  for A1c; LDL, HDL, Triglycerides, and  Waist Circumference were discussed in detail.  2) BP/HTN: Controlled . Continue current medications including ACEI/ARB. 3) Lipids/HPL:  she is not on statins, I would obtain fasting lipid panel before her next visit.  4)  Weight/Diet: CDE consult in progress, exercise, and carbohydrates information provided. 5. Vitamin D deficiency; she is status post vitamin D replacement. 6) Chronic Care/Health Maintenance:  -Patient  is on ACEI/ARB medications and encouraged to continue to follow up with Ophthalmology, Podiatrist at least yearly or according to recommendations, and advised to quit smoking. I have recommended yearly flu vaccine and pneumonia vaccination at least every 5 years; moderate intensity exercise for up to 150 minutes weekly; and  sleep for at least 7 hours a day.  - 25 minutes of time was spent on the care of this  patient , 50% of which was applied for counseling on diabetes complications and their preventions.  - I advised patient to maintain close follow up with TAPPER,DAVID B, MD for primary care needs.  Patient is asked to bring meter and  blood glucose logs during their next visit.   Follow up plan: -Return in about 3 months (around 09/23/2016) for diabetes, high blood pressure, high cholesterol, meter, and logs.  Glade Lloyd, MD Phone: [phone]  Fax: [phone]   06/23/2016, 11:22 AM

## 2016-06-23 NOTE — Patient Instructions (Signed)

## 2016-07-07 DIAGNOSIS — I10 Essential (primary) hypertension: Secondary | ICD-10-CM | POA: Diagnosis not present

## 2016-07-07 DIAGNOSIS — E1165 Type 2 diabetes mellitus with hyperglycemia: Secondary | ICD-10-CM | POA: Diagnosis not present

## 2016-07-07 DIAGNOSIS — Z6833 Body mass index (BMI) 33.0-33.9, adult: Secondary | ICD-10-CM | POA: Diagnosis not present

## 2016-07-07 DIAGNOSIS — R609 Edema, unspecified: Secondary | ICD-10-CM | POA: Diagnosis not present

## 2016-07-07 DIAGNOSIS — F17200 Nicotine dependence, unspecified, uncomplicated: Secondary | ICD-10-CM | POA: Diagnosis not present

## 2016-07-08 ENCOUNTER — Ambulatory Visit (INDEPENDENT_AMBULATORY_CARE_PROVIDER_SITE_OTHER): Payer: Medicare Other | Admitting: Internal Medicine

## 2016-07-20 ENCOUNTER — Emergency Department (HOSPITAL_COMMUNITY)
Admission: EM | Admit: 2016-07-20 | Discharge: 2016-07-20 | Disposition: A | Discharge status: Home / Self Care | Payer: Medicare Other | Attending: Emergency Medicine | Admitting: Emergency Medicine

## 2016-07-20 ENCOUNTER — Encounter (HOSPITAL_COMMUNITY): Payer: Self-pay | Admitting: Emergency Medicine

## 2016-07-20 DIAGNOSIS — I10 Essential (primary) hypertension: Secondary | ICD-10-CM | POA: Diagnosis not present

## 2016-07-20 DIAGNOSIS — Z79899 Other long term (current) drug therapy: Secondary | ICD-10-CM | POA: Insufficient documentation

## 2016-07-20 DIAGNOSIS — Z794 Long term (current) use of insulin: Secondary | ICD-10-CM | POA: Insufficient documentation

## 2016-07-20 DIAGNOSIS — F17210 Nicotine dependence, cigarettes, uncomplicated: Secondary | ICD-10-CM | POA: Insufficient documentation

## 2016-07-20 DIAGNOSIS — T7840XA Allergy, unspecified, initial encounter: Secondary | ICD-10-CM | POA: Diagnosis not present

## 2016-07-20 DIAGNOSIS — R21 Rash and other nonspecific skin eruption: Secondary | ICD-10-CM | POA: Diagnosis present

## 2016-07-20 DIAGNOSIS — E119 Type 2 diabetes mellitus without complications: Secondary | ICD-10-CM | POA: Diagnosis not present

## 2016-07-20 DIAGNOSIS — L509 Urticaria, unspecified: Secondary | ICD-10-CM | POA: Diagnosis not present

## 2016-07-20 DIAGNOSIS — Z7982 Long term (current) use of aspirin: Secondary | ICD-10-CM | POA: Diagnosis not present

## 2016-07-20 MED ORDER — PREDNISONE 20 MG PO TABS
40.0000 mg | ORAL_TABLET | Freq: Every day | ORAL | 0 refills | Status: DC
Start: 2016-07-20 — End: 2017-01-01

## 2016-07-20 MED ORDER — DIPHENHYDRAMINE HCL 25 MG PO CAPS
50.0000 mg | ORAL_CAPSULE | Freq: Once | ORAL | Status: AC
  Administered 2016-07-20: 50 mg via ORAL
  Filled 2016-07-20: qty 2

## 2016-07-20 MED ORDER — FAMOTIDINE 20 MG PO TABS
40.0000 mg | ORAL_TABLET | Freq: Once | ORAL | Status: AC
Start: 2016-07-20 — End: 2016-07-20
  Administered 2016-07-20: 40 mg via ORAL
  Filled 2016-07-20: qty 2

## 2016-07-20 MED ORDER — PREDNISONE 10 MG PO TABS
60.0000 mg | ORAL_TABLET | Freq: Once | ORAL | Status: AC
  Administered 2016-07-20: 60 mg via ORAL
  Filled 2016-07-20: qty 1

## 2016-07-20 NOTE — ED Triage Notes (Signed)
Pt states she has been breaking out in a rash since yesterday morning.  States she has been taking Benadryl since yesterday.  Last taken 2 hours ago.  Denies difficulty breathing or swallowing.

## 2016-07-20 NOTE — ED Provider Notes (Signed)
[facility] DEPT Provider Note   CSN: [id number] Arrival date & time: 07/20/16  1539  First Provider Contact:  None       History   Chief Complaint Chief Complaint  Patient presents with  . Allergic Reaction    HPI Vicki Moon is a 65 y.o. female.  HPI  Pt was seen at 1555. Per pt, c/o gradual onset and persistence of constant "itching rash" for the past 2 days. Pt has been taking OTC benadryl without improvement. Pt does not know what triggered the itching rash. Denies new foods, soaps, etc. Denies SOB/wheezing, no dysphagia, no abd pain.    Past Medical History:  Diagnosis Date  . Diabetes mellitus without complication (HCC)    Type 2 IDDM x 5 years  . Glaucoma   . Herniated lumbar intervertebral disc 06/2014  . PONV (postoperative nausea and vomiting)     Patient Active Problem List   Diagnosis Date Noted  . Uncontrolled type 2 diabetes mellitus with complication, with long-term current use of insulin ([place]) 12/20/2015  . Essential hypertension, benign 12/20/2015  . Vitamin D deficiency 12/20/2015  . Rectal bleeding 05/29/2015  . HNP (herniated nucleus pulposus), lumbar 07/12/2014    Class: Diagnosis of    Past Surgical History:  Procedure Laterality Date  . CHOLECYSTECTOMY    . COLONOSCOPY N/A 06/28/2015   Procedure: COLONOSCOPY;  Surgeon: Rogene Houston, MD;  [facility]: [facility];  Service: Endoscopy;  Laterality: N/A;  155  . EYE SURGERY    . LUMBAR LAMINECTOMY Right 07/12/2014   Procedure: LUMBAR FIVE TO SACRAL ONE MICRODISCECTOMY;  Surgeon: Marybelle Killings, MD;  Location: [facility];  Service: Orthopedics;  Laterality: Right;  . TUBAL LIGATION         Home Medications    Prior to Admission medications   Medication Sig Start Date End Date Taking? Authorizing Provider  aspirin 81 MG tablet Take 81 mg by mouth daily.    Historical Provider, MD  Insulin Detemir (LEVEMIR FLEXTOUCH [place]) Inject 60 Units into the skin at bedtime.    Historical Provider,  MD  insulin lispro (HUMALOG KWIKPEN) 100 UNIT/ML KiwkPen Inject 0.1-0.16 mLs (10-16 Units total) into the skin 3 (three) times daily. Patient taking differently: Inject 12-18 Units into the skin 3 (three) times daily.  12/20/15   Cassandria Anger, MD  latanoprost (XALATAN) 0.005 % ophthalmic solution Place 1 drop into both eyes at bedtime.    Historical Provider, MD  lisinopril (PRINIVIL,ZESTRIL) 10 MG tablet TAKE ONE TABLET BY MOUTH ONCE DAILY 03/25/16   Cassandria Anger, MD  Multiple Vitamins-Minerals (PRESERVISION AREDS PO) Take by mouth 2 (two) times daily.    Historical Provider, MD    Family History Family History  Problem Relation Age of Onset  . Hypertension Mother   . Stroke Mother   . Cancer Father     brain tumor  . Diabetes Father     Social History Social History  Substance Use Topics  . Smoking status: Current Some Day Smoker    Packs/day: 1.00    Years: 10.00    Types: Cigarettes  . Smokeless tobacco: Never Used     Comment:  1/2 pack a day since age 69  . Alcohol use No     Allergies   Review of patient's allergies indicates no known allergies.   Review of Systems Review of Systems ROS: Statement: All systems negative except as marked or noted in the HPI; Constitutional: Negative for fever and  chills. ; ; Eyes: Negative for eye pain, redness and discharge. ; ; ENMT: Negative for ear pain, hoarseness, nasal congestion, sinus pressure and sore throat. ; ; Cardiovascular: Negative for chest pain, palpitations, diaphoresis, dyspnea and peripheral edema. ; ; Respiratory: Negative for cough, wheezing and stridor. ; ; Gastrointestinal: Negative for nausea, vomiting, diarrhea, abdominal pain, blood in stool, hematemesis, jaundice and rectal bleeding. . ; ; Genitourinary: Negative for dysuria, flank pain and hematuria. ; ; Musculoskeletal: Negative for back pain and neck pain. Negative for swelling and trauma.; ; Skin: +hives. Negative for abrasions, blisters,  bruising and skin lesion.; ; Neuro: Negative for headache, lightheadedness and neck stiffness. Negative for weakness, altered level of consciousness, altered mental status, extremity weakness, paresthesias, involuntary movement, seizure and syncope.      Physical Exam Updated Vital Signs BP 97/82   Pulse 71   Temp 97.8 F (36.6 C) (Oral)   Resp 18   Ht 5\' 2"  (1.575 m)   Wt 180 lb (81.6 kg)   SpO2 96%   BMI 32.92 kg/m   Physical Exam 1600: Physical examination:  Nursing notes reviewed; Vital signs and O2 SAT reviewed;  Constitutional: Well developed, Well nourished, Well hydrated, In no acute distress; Head:  Normocephalic, atraumatic; Eyes: EOMI, PERRL, No scleral icterus; ENMT: Mouth and pharynx normal, Mucous membranes moist. Mouth and pharynx without lesions. No tonsillar exudates. No intra-oral edema. No submandibular or sublingual edema. No hoarse voice, no drooling, no stridor. No pain with manipulation of larynx. No trismus.; Neck: Supple, Full range of motion, No lymphadenopathy; Cardiovascular: Regular rate and rhythm, No gallop; Respiratory: Breath sounds clear & equal bilaterally, No wheezes.  Speaking full sentences with ease, Normal respiratory effort/excursion; Chest: Nontender, Movement normal; Abdomen: Soft, Nontender, Nondistended, Normal bowel sounds;; Extremities: Pulses normal, No tenderness, No edema, No calf edema or asymmetry.; Neuro: AA&Ox3, Major CN grossly intact.  Speech clear. No gross focal motor or sensory deficits in extremities.; Skin: Color normal, Warm, Dry. +scattered hives, arms, legs, torso.   ED Treatments / Results  Labs (all labs ordered are listed, but only abnormal results are displayed)   EKG  EKG Interpretation None       Radiology   Procedures Procedures (including critical care time)  Medications Ordered in ED Medications  diphenhydrAMINE (BENADRYL) capsule 50 mg (50 mg Oral Given 07/20/16 1615)  predniSONE (DELTASONE) tablet 60  mg (60 mg Oral Given 07/20/16 1615)  famotidine (PEPCID) tablet 40 mg (40 mg Oral Given 07/20/16 1615)     Initial Impression / Assessment and Plan / ED Course  I have reviewed the triage vital signs and the nursing notes.  Pertinent labs & imaging results that were available during my care of the patient were reviewed by me and considered in my medical decision making (see chart for details).  MDM Reviewed: previous chart, nursing note and vitals   1740:  Feels better after meds and wants to go home now. Tx hives symptomatically at this time. Dx d/w pt and family.  Questions answered.  Verb understanding, agreeable to d/c home with outpt f/u.    Final Clinical Impressions(s) / ED Diagnoses   Final diagnoses:  None    New Prescriptions New Prescriptions   No medications on file     Francine Graven, DO 07/23/16 1306

## 2016-07-20 NOTE — Discharge Instructions (Signed)
Take the prescription as directed.  Take over the counter benadryl, as directed on packaging, as needed for itching.  If the benadryl is too sedating, take an over the counter non-sedating antihistamine such as claritin, allegra or zyrtec, as directed on packaging.  Call your regular medical doctor tomorrow to schedule a follow up appointment within the next 2 days.  Return to the Emergency Department immediately sooner if worsening.  ° °

## 2016-07-22 DIAGNOSIS — Z6832 Body mass index (BMI) 32.0-32.9, adult: Secondary | ICD-10-CM | POA: Diagnosis not present

## 2016-07-22 DIAGNOSIS — L272 Dermatitis due to ingested food: Secondary | ICD-10-CM | POA: Diagnosis not present

## 2016-07-25 DIAGNOSIS — Z6832 Body mass index (BMI) 32.0-32.9, adult: Secondary | ICD-10-CM | POA: Diagnosis not present

## 2016-07-25 DIAGNOSIS — L299 Pruritus, unspecified: Secondary | ICD-10-CM | POA: Diagnosis not present

## 2016-07-25 DIAGNOSIS — L509 Urticaria, unspecified: Secondary | ICD-10-CM | POA: Diagnosis not present

## 2016-08-11 DIAGNOSIS — L298 Other pruritus: Secondary | ICD-10-CM | POA: Diagnosis not present

## 2016-08-28 DIAGNOSIS — Z23 Encounter for immunization: Secondary | ICD-10-CM | POA: Diagnosis not present

## 2016-09-04 DIAGNOSIS — M7071 Other bursitis of hip, right hip: Secondary | ICD-10-CM | POA: Diagnosis not present

## 2016-09-15 ENCOUNTER — Other Ambulatory Visit: Payer: Self-pay | Admitting: "Endocrinology

## 2016-09-15 DIAGNOSIS — E1165 Type 2 diabetes mellitus with hyperglycemia: Secondary | ICD-10-CM | POA: Diagnosis not present

## 2016-09-15 DIAGNOSIS — Z794 Long term (current) use of insulin: Secondary | ICD-10-CM | POA: Diagnosis not present

## 2016-09-15 DIAGNOSIS — E118 Type 2 diabetes mellitus with unspecified complications: Secondary | ICD-10-CM | POA: Diagnosis not present

## 2016-09-15 LAB — COMPLETE METABOLIC PANEL WITH GFR
ALBUMIN: 3.7 g/dL (ref 3.6–5.1)
ALK PHOS: 110 U/L (ref 33–130)
ALT: 16 U/L (ref 6–29)
AST: 11 U/L (ref 10–35)
BUN: 11 mg/dL (ref 7–25)
CO2: 27 mmol/L (ref 20–31)
Calcium: 8.8 mg/dL (ref 8.6–10.4)
Chloride: 107 mmol/L (ref 98–110)
Creat: 0.55 mg/dL (ref 0.50–0.99)
GFR, Est African American: >89 mL/min (ref >=60)
GFR, Est Non African American: >89 mL/min (ref >=60)
GLUCOSE: 168 mg/dL — AB (ref 65–99)
Potassium: 4.1 mmol/L (ref 3.5–5.3)
SODIUM: 141 mmol/L (ref 135–146)
Total Bilirubin: 0.4 mg/dL (ref 0.2–1.2)
Total Protein: 6.2 g/dL (ref 6.1–8.1)

## 2016-09-15 LAB — LIPID PANEL
CHOLESTEROL: 155 mg/dL (ref 125–200)
HDL: 44 mg/dL — ABNORMAL LOW (ref >=46)
LDL Cholesterol: 84 mg/dL (ref <130)
Total CHOL/HDL Ratio: 3.5 Ratio (ref <=5.0)
Triglycerides: 137 mg/dL (ref <150)
VLDL: 27 mg/dL (ref <30)

## 2016-09-15 LAB — HEMOGLOBIN A1C
Hgb A1c MFr Bld: 8.6 % — ABNORMAL HIGH (ref <5.7)
MEAN PLASMA GLUCOSE: 200 mg/dL

## 2016-09-16 DIAGNOSIS — H401132 Primary open-angle glaucoma, bilateral, moderate stage: Secondary | ICD-10-CM | POA: Diagnosis not present

## 2016-09-24 ENCOUNTER — Encounter: Payer: Self-pay | Admitting: "Endocrinology

## 2016-09-24 ENCOUNTER — Ambulatory Visit (INDEPENDENT_AMBULATORY_CARE_PROVIDER_SITE_OTHER): Payer: Medicare Other | Admitting: "Endocrinology

## 2016-09-24 VITALS — BP 146/79 | HR 72 | Ht 62.0 in | Wt 181.0 lb

## 2016-09-24 DIAGNOSIS — IMO0002 Reserved for concepts with insufficient information to code with codable children: Secondary | ICD-10-CM

## 2016-09-24 DIAGNOSIS — E1165 Type 2 diabetes mellitus with hyperglycemia: Secondary | ICD-10-CM | POA: Diagnosis not present

## 2016-09-24 DIAGNOSIS — E559 Vitamin D deficiency, unspecified: Secondary | ICD-10-CM

## 2016-09-24 DIAGNOSIS — Z794 Long term (current) use of insulin: Secondary | ICD-10-CM | POA: Diagnosis not present

## 2016-09-24 DIAGNOSIS — E118 Type 2 diabetes mellitus with unspecified complications: Secondary | ICD-10-CM

## 2016-09-24 DIAGNOSIS — I10 Essential (primary) hypertension: Secondary | ICD-10-CM | POA: Diagnosis not present

## 2016-09-24 MED ORDER — INSULIN PEN NEEDLE 31G X 8 MM MISC: 1.0000 | 3 refills | Status: DC

## 2016-09-24 MED ORDER — GLUCOSE BLOOD VI STRP: ORAL_STRIP | 3 refills | Status: DC

## 2016-09-24 MED ORDER — INSULIN LISPRO 100 UNIT/ML (KWIKPEN): 15.0000 [IU] | PEN_INJECTOR | Freq: Three times a day (TID) | SUBCUTANEOUS | 2 refills | Status: DC

## 2016-09-24 MED ORDER — INSULIN GLARGINE 100 UNIT/ML SOLOSTAR PEN: 70.0000 [IU] | PEN_INJECTOR | Freq: Every day | SUBCUTANEOUS | 2 refills | Status: DC

## 2016-09-24 NOTE — Progress Notes (Signed)
Subjective:    Patient ID: Vicki Moon, female    DOB: 1951/05/04, PCP Deloria Lair, MD   Past Medical History:  Diagnosis Date  . Diabetes mellitus without complication (HCC)    Type 2 IDDM x 5 years  . Glaucoma   . Herniated lumbar intervertebral disc 06/2014  . PONV (postoperative nausea and vomiting)    Past Surgical History:  Procedure Laterality Date  . CHOLECYSTECTOMY    . COLONOSCOPY N/A 06/28/2015   Procedure: COLONOSCOPY;  Surgeon: Rogene Houston, MD;  [facility];  Service: Endoscopy;  Laterality: N/A;  155  . EYE SURGERY    . LUMBAR LAMINECTOMY Right 07/12/2014   Procedure: LUMBAR FIVE TO SACRAL ONE MICRODISCECTOMY;  Surgeon: Marybelle Killings, MD;  Location: [facility];  Service: Orthopedics;  Laterality: Right;  . TUBAL LIGATION     Social History   Social History  . Marital status: Married    Spouse name: N/A  . Number of children: N/A  . Years of education: N/A   Social History Main Topics  . Smoking status: Current Some Day Smoker    Packs/day: 1.00    Years: 10.00    Types: Cigarettes  . Smokeless tobacco: Never Used     Comment:  1/2 pack a day since age 13  . Alcohol use No  . Drug use: No  . Sexual activity: Yes   Other Topics Concern  . None   Social History Narrative  . None   Outpatient Encounter Prescriptions as of 09/24/2016  Medication Sig  . aspirin 81 MG tablet Take 81 mg by mouth daily.  Marland Kitchen glucose blood (ONE TOUCH TEST STRIPS) test strip Use as instructed  . Insulin Glargine (LANTUS SOLOSTAR) 100 UNIT/ML Solostar Pen Inject 70 Units into the skin daily at 10 pm.  . insulin lispro (HUMALOG KWIKPEN) 100 UNIT/ML KiwkPen Inject 0.15-0.21 mLs (15-21 Units total) into the skin 3 (three) times daily with meals.  . Insulin Pen Needle (B-D ULTRAFINE III SHORT PEN) 31G X 8 MM MISC 1 each by Does not apply route as directed.  . latanoprost (XALATAN) 0.005 % ophthalmic solution Place 1 drop into both eyes at bedtime.  Marland Kitchen lisinopril  (PRINIVIL,ZESTRIL) 10 MG tablet TAKE ONE TABLET BY MOUTH ONCE DAILY  . Multiple Vitamins-Minerals (PRESERVISION AREDS PO) Take by mouth 2 (two) times daily.  . predniSONE (DELTASONE) 20 MG tablet Take 2 tablets (40 mg total) by mouth daily.  . [DISCONTINUED] Insulin Detemir (LEVEMIR FLEXTOUCH [place]) Inject 60 Units into the skin at bedtime.  . [DISCONTINUED] insulin lispro (HUMALOG KWIKPEN) 100 UNIT/ML KiwkPen Inject 0.1-0.16 mLs (10-16 Units total) into the skin 3 (three) times daily. (Patient taking differently: Inject 12-18 Units into the skin 3 (three) times daily. )   No facility-administered encounter medications on file as of 09/24/2016.    ALLERGIES: No Known Allergies VACCINATION STATUS:  There is no immunization history on file for this patient.  Diabetes  She presents for her follow-up diabetic visit. She has type 2 diabetes mellitus. Onset time: She was diagnosed at approximate age of 32 years. Her disease course has been stable. There are no hypoglycemic associated symptoms. Pertinent negatives for hypoglycemia include no confusion, headaches, pallor or seizures. There are no diabetic associated symptoms. Pertinent negatives for diabetes include no chest pain, no polydipsia, no polyphagia and no polyuria. There are no hypoglycemic complications. Symptoms are stable. There are no diabetic complications. Risk factors for coronary artery disease include diabetes  mellitus, hypertension, sedentary lifestyle and tobacco exposure. Current diabetic treatment includes insulin injections and oral agent (monotherapy). She is compliant with treatment most of the time. Her weight is stable. She is following a generally unhealthy diet. When asked about meal planning, she reported none. She has had a previous visit with a dietitian. She never participates in exercise. Her home blood glucose trend is decreasing steadily. Her breakfast blood glucose range is generally 180-200 mg/dl. Her lunch blood glucose  range is generally 180-200 mg/dl. Her dinner blood glucose range is generally 180-200 mg/dl. Her overall blood glucose range is 180-200 mg/dl. An ACE inhibitor/angiotensin II receptor blocker is being taken.  Hypertension  This is a chronic problem. The current episode started more than 1 year ago. The problem is controlled. Pertinent negatives include no chest pain, headaches, palpitations or shortness of breath. Risk factors for coronary artery disease include smoking/tobacco exposure, diabetes mellitus and sedentary lifestyle. Past treatments include ACE inhibitors. The current treatment provides moderate improvement.     Review of Systems  Constitutional: Negative for chills, fever and unexpected weight change.  HENT: Positive for sinus pressure. Negative for trouble swallowing and voice change.   Eyes: Negative for visual disturbance.  Respiratory: Negative for cough, shortness of breath and wheezing.   Cardiovascular: Negative for chest pain, palpitations and leg swelling.  Gastrointestinal: Negative for diarrhea, nausea and vomiting.  Endocrine: Negative for cold intolerance, heat intolerance, polydipsia, polyphagia and polyuria.  Musculoskeletal: Negative for arthralgias and myalgias.  Skin: Negative for color change, pallor, rash and wound.  Neurological: Negative for seizures and headaches.  Psychiatric/Behavioral: Negative for confusion and suicidal ideas.    Objective:    BP (!) 146/79   Pulse 72   Ht 5\' 2"  (1.575 m)   Wt 181 lb (82.1 kg)   BMI 33.11 kg/m   Wt Readings from Last 3 Encounters:  09/24/16 181 lb (82.1 kg)  07/20/16 180 lb (81.6 kg)  06/23/16 184 lb (83.5 kg)    Physical Exam  Constitutional: She is oriented to person, place, and time. She appears well-developed.  HENT:  Head: Normocephalic and atraumatic.  Eyes: EOM are normal.  Neck: Normal range of motion. Neck supple. No tracheal deviation present. No thyromegaly present.  Cardiovascular: Normal  rate and regular rhythm.   Pulmonary/Chest: Effort normal and breath sounds normal.  Abdominal: Soft. Bowel sounds are normal. There is no tenderness. There is no guarding.  Musculoskeletal: Normal range of motion. She exhibits no edema.  Neurological: She is alert and oriented to person, place, and time. She has normal reflexes. No cranial nerve deficit. Coordination normal.  Skin: Skin is warm and dry. No rash noted. No erythema. No pallor.  Psychiatric: She has a normal mood and affect. Judgment normal.     CMP     Component Value Date/Time   NA 141 09/15/2016 0945   K 4.1 09/15/2016 0945   CL 107 09/15/2016 0945   CO2 27 09/15/2016 0945   GLUCOSE 168 (H) 09/15/2016 0945   BUN 11 09/15/2016 0945   CREATININE 0.55 09/15/2016 0945   CALCIUM 8.8 09/15/2016 0945   PROT 6.2 09/15/2016 0945   ALBUMIN 3.7 09/15/2016 0945   AST 11 09/15/2016 0945   ALT 16 09/15/2016 0945   ALKPHOS 110 09/15/2016 0945   BILITOT 0.4 09/15/2016 0945   GFRNONAA >89 09/15/2016 0945   GFRAA >89 09/15/2016 0945     Diabetic Labs (most recent): Lab Results  Component Value Date   HGBA1C 8.6 (  H) 09/15/2016   HGBA1C 8.8 (H) 06/16/2016   HGBA1C 8.7 (H) 03/13/2016   Assessment & Plan:   1. Uncontrolled type 2 diabetes mellitus with complication, with long-term current use of insulin ([place]) - patient remains at a high risk for more acute and chronic complications of diabetes which include CAD, CVA, CKD, retinopathy, and neuropathy. These are all discussed in detail with the patient.  Patient came with stable But still above target glucose profile, and  recent A1c 8.6%, overall improving from 9.8 %.  Glucose logs and insulin administration records pertaining to this visit,  to be scanned into patient's records.  Recent labs reviewed.   - I have re-counseled the patient on diet management  by adopting a carbohydrate restricted / protein rich  Diet.  - Suggestion is made for patient to avoid simple  carbohydrates   from their diet including Cakes , Desserts, Ice Cream,  Soda (  diet and regular) , Sweet Tea , Candies,  Chips, Cookies, Artificial Sweeteners,   and "Sugar-free" Products .  This will help patient to have stable blood glucose profile and potentially avoid unintended  Weight gain.  - Patient is advised to stick to a routine mealtimes to eat 3 meals  a day and avoid unnecessary snacks ( to snack only to correct hypoglycemia).  - The patient  has been  scheduled with Jearld Fenton, RDN, CDE for individualized DM education.  - I have approached patient with the following individualized plan to manage diabetes and patient agrees.  - I will increase basal insulin Levemir  To 70 units QHS ( will give her sample Lantus and rx, due to the fact that cost of Levemir going up) , and increase  prandial insulin Humalog to 15 units TIDAC for pre-meal BG readings of 90-150mg /dl, plus patient specific correction dose of rapid acting insulin  for unexpected hyperglycemia above 150mg /dl, associated with strict monitoring of glucose  AC and HS. - Patient is warned not to take insulin without proper monitoring per orders. -Adjustment parameters are given for hypo and hyperglycemia in writing. -Patient is encouraged to call clinic for blood glucose levels less than 70 or above 300 mg /dl. - She  did not tolerate metformin, her GI complaints have disappeared after she stopped metformin.   - I advised her to stay off of metformin.   - Anti-islet cell antibodies undetectable.  - She is not suitable candidate for incretin therapy for now - Patient specific target  for A1c; LDL, HDL, Triglycerides, and  Waist Circumference were discussed in detail.  2) BP/HTN: Controlled . Continue current medications including ACEI/ARB. 3) Lipids/HPL:  she is not on statins, I would obtain fasting lipid panel before her next visit.  4)  Weight/Diet: CDE consult in progress, exercise, and carbohydrates information  provided. 5. Vitamin D deficiency; she is status post vitamin D replacement. 6) Chronic Care/Health Maintenance:  -Patient  is on ACEI/ARB medications and encouraged to continue to follow up with Ophthalmology, Podiatrist at least yearly or according to recommendations, and advised to quit smoking. I have recommended yearly flu vaccine and pneumonia vaccination at least every 5 years; moderate intensity exercise for up to 150 minutes weekly; and  sleep for at least 7 hours a day.  - 25 minutes of time was spent on the care of this patient , 50% of which was applied for counseling on diabetes complications and their preventions.  - I advised patient to maintain close follow up with TAPPER,DAVID B, MD  for primary care needs.  Patient is asked to bring meter and  blood glucose logs during their next visit.   Follow up plan: -Return in about 3 months (around 12/25/2016) for follow up with pre-visit labs, meter, and logs.  Glade Lloyd, MD Phone: [phone]  Fax: [phone]   09/24/2016, 12:11 PM

## 2016-09-24 NOTE — Patient Instructions (Signed)

## 2016-10-01 ENCOUNTER — Other Ambulatory Visit: Payer: Self-pay | Admitting: "Endocrinology

## 2016-11-04 ENCOUNTER — Other Ambulatory Visit: Payer: Self-pay | Admitting: "Endocrinology

## 2016-12-24 ENCOUNTER — Other Ambulatory Visit: Payer: Self-pay | Admitting: "Endocrinology

## 2016-12-24 DIAGNOSIS — E1165 Type 2 diabetes mellitus with hyperglycemia: Secondary | ICD-10-CM | POA: Diagnosis not present

## 2016-12-24 DIAGNOSIS — Z794 Long term (current) use of insulin: Secondary | ICD-10-CM | POA: Diagnosis not present

## 2016-12-24 DIAGNOSIS — E118 Type 2 diabetes mellitus with unspecified complications: Secondary | ICD-10-CM | POA: Diagnosis not present

## 2016-12-24 LAB — COMPREHENSIVE METABOLIC PANEL
ALBUMIN: 3.6 g/dL (ref 3.6–5.1)
ALK PHOS: 138 U/L — AB (ref 33–130)
ALT: 14 U/L (ref 6–29)
AST: 13 U/L (ref 10–35)
BILIRUBIN TOTAL: 0.3 mg/dL (ref 0.2–1.2)
BUN: 15 mg/dL (ref 7–25)
CO2: 27 mmol/L (ref 20–31)
CREATININE: 0.58 mg/dL (ref 0.50–0.99)
Calcium: 9 mg/dL (ref 8.6–10.4)
Chloride: 105 mmol/L (ref 98–110)
Glucose, Bld: 173 mg/dL — ABNORMAL HIGH (ref 65–99)
Potassium: 4.1 mmol/L (ref 3.5–5.3)
SODIUM: 140 mmol/L (ref 135–146)
Total Protein: 6.5 g/dL (ref 6.1–8.1)

## 2016-12-24 LAB — HEMOGLOBIN A1C
HEMOGLOBIN A1C: 9.4 % — AB (ref <5.7)
MEAN PLASMA GLUCOSE: 223 mg/dL

## 2017-01-01 ENCOUNTER — Ambulatory Visit (INDEPENDENT_AMBULATORY_CARE_PROVIDER_SITE_OTHER): Payer: Medicare Other | Admitting: "Endocrinology

## 2017-01-01 ENCOUNTER — Encounter: Payer: Self-pay | Admitting: "Endocrinology

## 2017-01-01 VITALS — BP 143/81 | HR 76 | Ht 62.0 in | Wt 196.0 lb

## 2017-01-01 DIAGNOSIS — E118 Type 2 diabetes mellitus with unspecified complications: Secondary | ICD-10-CM | POA: Diagnosis not present

## 2017-01-01 DIAGNOSIS — E1165 Type 2 diabetes mellitus with hyperglycemia: Secondary | ICD-10-CM

## 2017-01-01 DIAGNOSIS — Z794 Long term (current) use of insulin: Secondary | ICD-10-CM | POA: Diagnosis not present

## 2017-01-01 DIAGNOSIS — I10 Essential (primary) hypertension: Secondary | ICD-10-CM | POA: Diagnosis not present

## 2017-01-01 DIAGNOSIS — IMO0002 Reserved for concepts with insufficient information to code with codable children: Secondary | ICD-10-CM

## 2017-01-01 MED ORDER — INSULIN LISPRO 100 UNIT/ML (KWIKPEN): 18.0000 [IU] | PEN_INJECTOR | Freq: Three times a day (TID) | SUBCUTANEOUS | 2 refills | Status: DC

## 2017-01-01 MED ORDER — INSULIN GLARGINE 100 UNIT/ML SOLOSTAR PEN: 80.0000 [IU] | PEN_INJECTOR | Freq: Every day | SUBCUTANEOUS | 2 refills | Status: DC

## 2017-01-01 MED ORDER — DEXAMETHASONE 1 MG PO TABS: 1.0000 mg | ORAL_TABLET | Freq: Once | ORAL | 0 refills | Status: AC

## 2017-01-01 MED ORDER — LISINOPRIL 10 MG PO TABS: 10.0000 mg | ORAL_TABLET | Freq: Every day | ORAL | 3 refills | Status: DC

## 2017-01-01 NOTE — Progress Notes (Signed)
Subjective:    Patient ID: Vicki Moon, female    DOB: 04/06/1951, PCP Deloria Lair, MD   Past Medical History:  Diagnosis Date  . Diabetes mellitus without complication (HCC)    Type 2 IDDM x 5 years  . Glaucoma   . Herniated lumbar intervertebral disc 06/2014  . PONV (postoperative nausea and vomiting)    Past Surgical History:  Procedure Laterality Date  . CHOLECYSTECTOMY    . COLONOSCOPY N/A 06/28/2015   Procedure: COLONOSCOPY;  Surgeon: Rogene Houston, MD;  [facility];  Service: Endoscopy;  Laterality: N/A;  155  . EYE SURGERY    . LUMBAR LAMINECTOMY Right 07/12/2014   Procedure: LUMBAR FIVE TO SACRAL ONE MICRODISCECTOMY;  Surgeon: Marybelle Killings, MD;  Location: [place];  Service: Orthopedics;  Laterality: Right;  . TUBAL LIGATION     Social History   Social History  . Marital status: Married    Spouse name: N/A  . Number of children: N/A  . Years of education: N/A   Social History Main Topics  . Smoking status: Current Some Day Smoker    Packs/day: 1.00    Years: 10.00    Types: Cigarettes  . Smokeless tobacco: Never Used     Comment:  1/2 pack a day since age 43  . Alcohol use No  . Drug use: No  . Sexual activity: Yes   Other Topics Concern  . None   Social History Narrative  . None   Outpatient Encounter Prescriptions as of 01/01/2017  Medication Sig  . aspirin 81 MG tablet Take 81 mg by mouth daily.  . B-D ULTRAFINE III SHORT PEN 31G X 8 MM MISC USE ONE  4 TIMES DAILY  . dexamethasone (DECADRON) 1 MG tablet Take 1 tablet (1 mg total) by mouth once.  Marland Kitchen glucose blood (ONE TOUCH TEST STRIPS) test strip Use as instructed  . Insulin Glargine (LANTUS SOLOSTAR) 100 UNIT/ML Solostar Pen Inject 80 Units into the skin daily at 10 pm.  . insulin lispro (HUMALOG KWIKPEN) 100 UNIT/ML KiwkPen Inject 0.18-0.24 mLs (18-24 Units total) into the skin 3 (three) times daily with meals.  . latanoprost (XALATAN) 0.005 % ophthalmic solution Place 1 drop  into both eyes at bedtime.  Marland Kitchen lisinopril (PRINIVIL,ZESTRIL) 10 MG tablet Take 1 tablet (10 mg total) by mouth daily.  . Multiple Vitamins-Minerals (PRESERVISION AREDS PO) Take by mouth 2 (two) times daily.  . [DISCONTINUED] HUMALOG KWIKPEN 100 UNIT/ML KiwkPen INJECT UP TO 16 UNITS SUBCUTANEOUSLY THREE TIMES DAILY BEFORE MEAL(S)  . [DISCONTINUED] Insulin Glargine (LANTUS SOLOSTAR) 100 UNIT/ML Solostar Pen Inject 70 Units into the skin daily at 10 pm.  . [DISCONTINUED] lisinopril (PRINIVIL,ZESTRIL) 10 MG tablet TAKE ONE TABLET BY MOUTH ONCE DAILY  . [DISCONTINUED] predniSONE (DELTASONE) 20 MG tablet Take 2 tablets (40 mg total) by mouth daily.   No facility-administered encounter medications on file as of 01/01/2017.    ALLERGIES: No Known Allergies VACCINATION STATUS:  There is no immunization history on file for this patient.  Diabetes  She presents for her follow-up diabetic visit. She has type 2 diabetes mellitus. Onset time: She was diagnosed at approximate age of 57 years. Her disease course has been worsening. There are no hypoglycemic associated symptoms. Pertinent negatives for hypoglycemia include no confusion, headaches, pallor or seizures. There are no diabetic associated symptoms. Pertinent negatives for diabetes include no chest pain, no polydipsia, no polyphagia and no polyuria. There are no hypoglycemic complications. Symptoms  are worsening. There are no diabetic complications. Risk factors for coronary artery disease include diabetes mellitus, hypertension, sedentary lifestyle and tobacco exposure. Current diabetic treatment includes insulin injections and oral agent (monotherapy). She is compliant with treatment most of the time. Her weight is increasing rapidly. She is following a generally unhealthy diet. When asked about meal planning, she reported none. She has had a previous visit with a dietitian. She never participates in exercise. Her home blood glucose trend is decreasing  steadily. Her breakfast blood glucose range is generally 180-200 mg/dl. Her lunch blood glucose range is generally 180-200 mg/dl. Her dinner blood glucose range is generally 180-200 mg/dl. Her overall blood glucose range is 180-200 mg/dl. An ACE inhibitor/angiotensin II receptor blocker is being taken.  Hypertension  This is a chronic problem. The current episode started more than 1 year ago. The problem is controlled. Pertinent negatives include no chest pain, headaches, palpitations or shortness of breath. Risk factors for coronary artery disease include smoking/tobacco exposure, diabetes mellitus and sedentary lifestyle. Past treatments include ACE inhibitors. The current treatment provides moderate improvement.     Review of Systems  Constitutional: Negative for chills, fever and unexpected weight change.  HENT: Positive for sinus pressure. Negative for trouble swallowing and voice change.   Eyes: Negative for visual disturbance.  Respiratory: Negative for cough, shortness of breath and wheezing.   Cardiovascular: Negative for chest pain, palpitations and leg swelling.  Gastrointestinal: Negative for diarrhea, nausea and vomiting.  Endocrine: Negative for cold intolerance, heat intolerance, polydipsia, polyphagia and polyuria.  Musculoskeletal: Negative for arthralgias and myalgias.  Skin: Negative for color change, pallor, rash and wound.  Neurological: Negative for seizures and headaches.  Psychiatric/Behavioral: Negative for confusion and suicidal ideas.    Objective:    BP (!) 143/81   Pulse 76   Ht 5\' 2"  (1.575 m)   Wt 196 lb (88.9 kg)   SpO2 99%   BMI 35.85 kg/m   Wt Readings from Last 3 Encounters:  01/01/17 196 lb (88.9 kg)  09/24/16 181 lb (82.1 kg)  07/20/16 180 lb (81.6 kg)    Physical Exam  Constitutional: She is oriented to person, place, and time. She appears well-developed.  HENT:  Head: Normocephalic and atraumatic.  Eyes: EOM are normal.  Neck: Normal range  of motion. Neck supple. No tracheal deviation present. No thyromegaly present.  Cardiovascular: Normal rate and regular rhythm.   Pulmonary/Chest: Effort normal and breath sounds normal.  Abdominal: Soft. Bowel sounds are normal. There is no tenderness. There is no guarding.  Musculoskeletal: Normal range of motion. She exhibits no edema.  Neurological: She is alert and oriented to person, place, and time. She has normal reflexes. No cranial nerve deficit. Coordination normal.  Skin: Skin is warm and dry. No rash noted. No erythema. No pallor.  Psychiatric: She has a normal mood and affect. Judgment normal.     CMP     Component Value Date/Time   NA 140 12/24/2016 0947   K 4.1 12/24/2016 0947   CL 105 12/24/2016 0947   CO2 27 12/24/2016 0947   GLUCOSE 173 (H) 12/24/2016 0947   BUN 15 12/24/2016 0947   CREATININE 0.58 12/24/2016 0947   CALCIUM 9.0 12/24/2016 0947   PROT 6.5 12/24/2016 0947   ALBUMIN 3.6 12/24/2016 0947   AST 13 12/24/2016 0947   ALT 14 12/24/2016 0947   ALKPHOS 138 (H) 12/24/2016 0947   BILITOT 0.3 12/24/2016 0947   GFRNONAA >89 09/15/2016 0945   GFRAA >  89 09/15/2016 0945     Diabetic Labs (most recent): Lab Results  Component Value Date   HGBA1C 9.4 (H) 12/24/2016   HGBA1C 8.6 (H) 09/15/2016   HGBA1C 8.8 (H) 06/16/2016   Assessment & Plan:   1. Uncontrolled type 2 diabetes mellitus with complication, with long-term current use of insulin ([place]) - patient remains at a high risk for more acute and chronic complications of diabetes which include CAD, CVA, CKD, retinopathy, and neuropathy. These are all discussed in detail with the patient.  Patient came with loss of control of her diabetes with average blood glucose above 200, her A1c increasing to 9.4% from 8.6 %.   Glucose logs and insulin administration records pertaining to this visit,  to be scanned into patient's records.  Recent labs reviewed.   - I have re-counseled the patient on diet management   by adopting a carbohydrate restricted / protein rich  Diet.  - Suggestion is made for patient to avoid simple carbohydrates   from their diet including Cakes , Desserts, Ice Cream,  Soda (  diet and regular) , Sweet Tea , Candies,  Chips, Cookies, Artificial Sweeteners,   and "Sugar-free" Products .  This will help patient to have stable blood glucose profile and potentially avoid unintended  Weight gain.  - Patient is advised to stick to a routine mealtimes to eat 3 meals  a day and avoid unnecessary snacks ( to snack only to correct hypoglycemia).  - The patient  has been  scheduled with Jearld Fenton, RDN, CDE for individualized DM education.  - I have approached patient with the following individualized plan to manage diabetes and patient agrees.  - She admits she has had dietary indiscretion. I will increase  Lantus to 80 units daily at bedtime, increase Humalog to 18 units 3 times a day before meals for  pre-meal BG readings of 90-150mg /dl, plus patient specific correction dose of rapid acting insulin  for unexpected hyperglycemia above 150mg /dl, associated with strict monitoring of glucose  AC and HS. - Patient is warned not to take insulin without proper monitoring per orders. -Adjustment parameters are given for hypo and hyperglycemia in writing. -Patient is encouraged to call clinic for blood glucose levels less than 70 or above 300 mg /dl. - She  did not tolerate metformin, her GI complaints have disappeared after she stopped metformin.   - I advised her to stay off of metformin.  - I will proceed to obtain low-dose dexamethasone suppression test to screen for endogenous steroid burden.  - Anti-islet cell antibodies undetectable.  - She is not suitable candidate for incretin therapy for now - Patient specific target  for A1c; LDL, HDL, Triglycerides, and  Waist Circumference were discussed in detail.  2) BP/HTN: Controlled . Continue current medications including ACEI/ARB. 3)  Lipids/HPL:  she is not on statins, I would obtain fasting lipid panel before her next visit.  4)  Weight/Diet: She has had itching and rash for which she visited ER in July 2017 when she was given oral prednisone for 10 days. She has gained bouts 15 pounds since last visit. CDE consult in progress, exercise, and carbohydrates information provided.  5. Vitamin D deficiency; she is status post vitamin D replacement. 6) Chronic Care/Health Maintenance:  -Patient  is on ACEI/ARB medications and encouraged to continue to follow up with Ophthalmology, Podiatrist at least yearly or according to recommendations, and advised to quit smoking. I have recommended yearly flu vaccine and pneumonia vaccination at least  every 5 years; moderate intensity exercise for up to 150 minutes weekly; and  sleep for at least 7 hours a day.  - 25 minutes of time was spent on the care of this patient , 50% of which was applied for counseling on diabetes complications and their preventions.  - I advised patient to maintain close follow up with TAPPER,DAVID B, MD for primary care needs.  Patient is asked to bring meter and  blood glucose logs during their next visit.   Follow up plan: -Return in about 2 weeks (around 01/15/2017) for follow up with pre-visit labs, meter, and logs.  Glade Lloyd, MD Phone: [phone]  Fax: [phone]   01/01/2017, 12:54 PM

## 2017-01-01 NOTE — Patient Instructions (Signed)

## 2017-01-02 DIAGNOSIS — E1165 Type 2 diabetes mellitus with hyperglycemia: Secondary | ICD-10-CM | POA: Diagnosis not present

## 2017-01-02 DIAGNOSIS — E118 Type 2 diabetes mellitus with unspecified complications: Secondary | ICD-10-CM | POA: Diagnosis not present

## 2017-01-02 DIAGNOSIS — Z794 Long term (current) use of insulin: Secondary | ICD-10-CM | POA: Diagnosis not present

## 2017-01-03 LAB — CORTISOL-AM, BLOOD: Cortisol - AM: 6.9 ug/dL

## 2017-01-05 LAB — ACTH: C206 ACTH: 24 pg/mL (ref 6–50)

## 2017-01-12 DIAGNOSIS — Z794 Long term (current) use of insulin: Secondary | ICD-10-CM | POA: Diagnosis not present

## 2017-01-12 DIAGNOSIS — F39 Unspecified mood [affective] disorder: Secondary | ICD-10-CM | POA: Diagnosis not present

## 2017-01-12 DIAGNOSIS — E1165 Type 2 diabetes mellitus with hyperglycemia: Secondary | ICD-10-CM | POA: Diagnosis not present

## 2017-01-12 DIAGNOSIS — I10 Essential (primary) hypertension: Secondary | ICD-10-CM | POA: Diagnosis not present

## 2017-01-12 DIAGNOSIS — R635 Abnormal weight gain: Secondary | ICD-10-CM | POA: Diagnosis not present

## 2017-01-13 ENCOUNTER — Other Ambulatory Visit: Payer: Self-pay

## 2017-01-13 MED ORDER — BASAGLAR KWIKPEN 100 UNIT/ML ~~LOC~~ SOPN: 80.0000 [IU] | PEN_INJECTOR | Freq: Every day | SUBCUTANEOUS | 2 refills | Status: DC

## 2017-01-19 ENCOUNTER — Ambulatory Visit: Payer: Medicare Other | Admitting: "Endocrinology

## 2017-01-19 DIAGNOSIS — H5201 Hypermetropia, right eye: Secondary | ICD-10-CM | POA: Diagnosis not present

## 2017-01-19 DIAGNOSIS — H401132 Primary open-angle glaucoma, bilateral, moderate stage: Secondary | ICD-10-CM | POA: Diagnosis not present

## 2017-01-19 DIAGNOSIS — Z961 Presence of intraocular lens: Secondary | ICD-10-CM | POA: Diagnosis not present

## 2017-01-20 ENCOUNTER — Encounter: Payer: Self-pay | Admitting: "Endocrinology

## 2017-01-20 ENCOUNTER — Ambulatory Visit (INDEPENDENT_AMBULATORY_CARE_PROVIDER_SITE_OTHER): Payer: Medicare Other | Admitting: "Endocrinology

## 2017-01-20 VITALS — BP 145/76 | HR 79 | Ht 62.0 in | Wt 197.0 lb

## 2017-01-20 DIAGNOSIS — I10 Essential (primary) hypertension: Secondary | ICD-10-CM

## 2017-01-20 DIAGNOSIS — R1084 Generalized abdominal pain: Secondary | ICD-10-CM

## 2017-01-20 DIAGNOSIS — R635 Abnormal weight gain: Secondary | ICD-10-CM | POA: Diagnosis not present

## 2017-01-20 DIAGNOSIS — E270 Other adrenocortical overactivity: Secondary | ICD-10-CM

## 2017-01-20 DIAGNOSIS — IMO0002 Reserved for concepts with insufficient information to code with codable children: Secondary | ICD-10-CM

## 2017-01-20 DIAGNOSIS — E118 Type 2 diabetes mellitus with unspecified complications: Secondary | ICD-10-CM | POA: Diagnosis not present

## 2017-01-20 DIAGNOSIS — E1165 Type 2 diabetes mellitus with hyperglycemia: Secondary | ICD-10-CM | POA: Diagnosis not present

## 2017-01-20 DIAGNOSIS — Z794 Long term (current) use of insulin: Secondary | ICD-10-CM

## 2017-01-20 NOTE — Progress Notes (Signed)
Subjective:    Patient ID: Vicki Moon, female    DOB: 10-19-1951, PCP Deloria Lair, MD   Past Medical History:  Diagnosis Date  . Diabetes mellitus without complication (HCC)    Type 2 IDDM x 5 years  . Glaucoma   . Herniated lumbar intervertebral disc 06/2014  . PONV (postoperative nausea and vomiting)    Past Surgical History:  Procedure Laterality Date  . CHOLECYSTECTOMY    . COLONOSCOPY N/A 06/28/2015   Procedure: COLONOSCOPY;  Surgeon: Rogene Houston, MD;  [facility];  Service: Endoscopy;  Laterality: N/A;  155  . EYE SURGERY    . LUMBAR LAMINECTOMY Right 07/12/2014   Procedure: LUMBAR FIVE TO SACRAL ONE MICRODISCECTOMY;  Surgeon: Marybelle Killings, MD;  Location: [place];  Service: Orthopedics;  Laterality: Right;  . TUBAL LIGATION     Social History   Social History  . Marital status: Married    Spouse name: N/A  . Number of children: N/A  . Years of education: N/A   Social History Main Topics  . Smoking status: Current Some Day Smoker    Packs/day: 1.00    Years: 10.00    Types: Cigarettes  . Smokeless tobacco: Never Used     Comment:  1/2 pack a day since age 66  . Alcohol use No  . Drug use: No  . Sexual activity: Yes   Other Topics Concern  . None   Social History Narrative  . None   Outpatient Encounter Prescriptions as of 01/20/2017  Medication Sig  . Insulin Glargine (LANTUS SOLOSTAR [place]) Inject 80 Units into the skin at bedtime.  Marland Kitchen aspirin 81 MG tablet Take 81 mg by mouth daily.  . B-D ULTRAFINE III SHORT PEN 31G X 8 MM MISC USE ONE  4 TIMES DAILY  . glucose blood (ONE TOUCH TEST STRIPS) test strip Use as instructed  . insulin lispro (HUMALOG KWIKPEN) 100 UNIT/ML KiwkPen Inject 0.18-0.24 mLs (18-24 Units total) into the skin 3 (three) times daily with meals.  . latanoprost (XALATAN) 0.005 % ophthalmic solution Place 1 drop into both eyes at bedtime.  Marland Kitchen lisinopril (PRINIVIL,ZESTRIL) 10 MG tablet Take 1 tablet (10 mg total) by mouth  daily.  . Multiple Vitamins-Minerals (PRESERVISION AREDS PO) Take by mouth 2 (two) times daily.  . [DISCONTINUED] Insulin Glargine (BASAGLAR KWIKPEN) 100 UNIT/ML SOPN Inject 0.8 mLs (80 Units total) into the skin at bedtime.  . [DISCONTINUED] Insulin Glargine (LANTUS SOLOSTAR) 100 UNIT/ML Solostar Pen Inject 80 Units into the skin daily at 10 pm.   No facility-administered encounter medications on file as of 01/20/2017.    ALLERGIES: No Known Allergies VACCINATION STATUS:  There is no immunization history on file for this patient.  Diabetes  She presents for her follow-up diabetic visit. She has type 2 diabetes mellitus. Onset time: She was diagnosed at approximate age of 66 years. Her disease course has been stable. There are no hypoglycemic associated symptoms. Pertinent negatives for hypoglycemia include no confusion, headaches, pallor or seizures. There are no diabetic associated symptoms. Pertinent negatives for diabetes include no chest pain, no polydipsia, no polyphagia and no polyuria. There are no hypoglycemic complications. Symptoms are stable. There are no diabetic complications. Risk factors for coronary artery disease include diabetes mellitus, hypertension, sedentary lifestyle and tobacco exposure. Current diabetic treatment includes insulin injections and oral agent (monotherapy). She is compliant with treatment most of the time. Her weight is increasing rapidly. She is following a generally  unhealthy diet. When asked about meal planning, she reported none. She has had a previous visit with a dietitian. She never participates in exercise. Her home blood glucose trend is decreasing steadily. Her breakfast blood glucose range is generally 180-200 mg/dl. Her lunch blood glucose range is generally 180-200 mg/dl. Her dinner blood glucose range is generally 180-200 mg/dl. Her overall blood glucose range is 180-200 mg/dl. An ACE inhibitor/angiotensin II receptor blocker is being taken.   Hypertension  This is a chronic problem. The current episode started more than 1 year ago. The problem is controlled. Pertinent negatives include no chest pain, headaches, palpitations or shortness of breath. Risk factors for coronary artery disease include smoking/tobacco exposure, diabetes mellitus and sedentary lifestyle. Past treatments include ACE inhibitors. The current treatment provides moderate improvement.     Review of Systems  Constitutional: Negative for chills, fever and unexpected weight change.  HENT: Positive for sinus pressure. Negative for trouble swallowing and voice change.   Eyes: Negative for visual disturbance.  Respiratory: Negative for cough, shortness of breath and wheezing.   Cardiovascular: Negative for chest pain, palpitations and leg swelling.  Gastrointestinal: Negative for diarrhea, nausea and vomiting.  Endocrine: Negative for cold intolerance, heat intolerance, polydipsia, polyphagia and polyuria.  Musculoskeletal: Negative for arthralgias and myalgias.  Skin: Negative for color change, pallor, rash and wound.  Neurological: Negative for seizures and headaches.  Psychiatric/Behavioral: Negative for confusion and suicidal ideas.    Objective:    BP (!) 145/76   Pulse 79   Ht 5\' 2"  (1.575 m)   Wt 197 lb (89.4 kg)   BMI 36.03 kg/m   Wt Readings from Last 3 Encounters:  01/20/17 197 lb (89.4 kg)  01/01/17 196 lb (88.9 kg)  09/24/16 181 lb (82.1 kg)    Physical Exam  Constitutional: She is oriented to person, place, and time. She appears well-developed.  HENT:  Head: Normocephalic and atraumatic.  Eyes: EOM are normal.  Neck: Normal range of motion. Neck supple. No tracheal deviation present. No thyromegaly present.  Cardiovascular: Normal rate and regular rhythm.   Pulmonary/Chest: Effort normal and breath sounds normal.  Abdominal: Soft. Bowel sounds are normal. There is no tenderness. There is no guarding.  Musculoskeletal: Normal range of  motion. She exhibits no edema.  Neurological: She is alert and oriented to person, place, and time. She has normal reflexes. No cranial nerve deficit. Coordination normal.  Skin: Skin is warm and dry. No rash noted. No erythema. No pallor.  Psychiatric: She has a normal mood and affect. Judgment normal.     CMP     Component Value Date/Time   NA 140 12/24/2016 0947   K 4.1 12/24/2016 0947   CL 105 12/24/2016 0947   CO2 27 12/24/2016 0947   GLUCOSE 173 (H) 12/24/2016 0947   BUN 15 12/24/2016 0947   CREATININE 0.58 12/24/2016 0947   CALCIUM 9.0 12/24/2016 0947   PROT 6.5 12/24/2016 0947   ALBUMIN 3.6 12/24/2016 0947   AST 13 12/24/2016 0947   ALT 14 12/24/2016 0947   ALKPHOS 138 (H) 12/24/2016 0947   BILITOT 0.3 12/24/2016 0947   GFRNONAA >89 09/15/2016 0945   GFRAA >89 09/15/2016 0945     Diabetic Labs (most recent): Lab Results  Component Value Date   HGBA1C 9.4 (H) 12/24/2016   HGBA1C 8.6 (H) 09/15/2016   HGBA1C 8.8 (H) 06/16/2016   Assessment & Plan:   1. Uncontrolled type 2 diabetes mellitus with complication, with long-term current use of  insulin ([place]) - patient remains at a high risk for more acute and chronic complications of diabetes which include CAD, CVA, CKD, retinopathy, and neuropathy. These are all discussed in detail with the patient.  Patient came with loss of control of her diabetes with average blood glucose above 200, her A1c increasing to 9.4% from 8.6 %.   Glucose logs and insulin administration records pertaining to this visit,  to be scanned into patient's records.  Recent labs reviewed.   - I have re-counseled the patient on diet management  by adopting a carbohydrate restricted / protein rich  Diet.  - Suggestion is made for patient to avoid simple carbohydrates   from their diet including Cakes , Desserts, Ice Cream,  Soda (  diet and regular) , Sweet Tea , Candies,  Chips, Cookies, Artificial Sweeteners,   and "Sugar-free" Products .  This will  help patient to have stable blood glucose profile and potentially avoid unintended  Weight gain.  - Patient is advised to stick to a routine mealtimes to eat 3 meals  a day and avoid unnecessary snacks ( to snack only to correct hypoglycemia).  - The patient  has been  scheduled with Jearld Fenton, RDN, CDE for individualized DM education.  - I have approached patient with the following individualized plan to manage diabetes and patient agrees.  - She admits she has had dietary indiscretion. I will continue  Lantus 80 units daily at bedtime,  Humalog to 18 units 3 times a day before meals for  pre-meal BG readings of 90-150mg /dl, plus patient specific correction dose of rapid acting insulin  for unexpected hyperglycemia above 150mg /dl, associated with strict monitoring of glucose  AC and HS. - Patient is warned not to take insulin without proper monitoring per orders. -Adjustment parameters are given for hypo and hyperglycemia in writing. -Patient is encouraged to call clinic for blood glucose levels less than 70 or above 300 mg /dl. - She  did not tolerate metformin, her GI complaints have disappeared after she stopped metformin.   - I advised her to stay off of metformin.  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 - She will need CT of adrenals to locate any neoplastic process. If not she may benefit from cortisol lowering therapy- [place]. - Anti-islet cell antibodies undetectable.  - She is not suitable candidate for incretin therapy for now - Patient specific target  for A1c; LDL, HDL, Triglycerides, and  Waist Circumference were discussed in detail.  2) BP/HTN: Controlled . Continue current medications including ACEI/ARB. 3) Lipids/HPL:  she is not on statins, I would obtain fasting lipid panel before her next visit.  4)  Weight/Diet: She continued to see weight increase progressively . CDE consult in progress, exercise, and  carbohydrates information provided.  5. Vitamin D deficiency; she is status post vitamin D replacement. 6) Chronic Care/Health Maintenance:  -Patient  is on ACEI/ARB medications and encouraged to continue to follow up with Ophthalmology, Podiatrist at least yearly or according to recommendations, and advised to quit smoking. I have recommended yearly flu vaccine and pneumonia vaccination at least every 5 years; moderate intensity exercise for up to 150 minutes weekly; and  sleep for at least 7 hours a day.  - 25 minutes of time was spent on the care of this patient , 50% of which was applied for counseling on diabetes complications and their preventions.  - I advised patient to maintain close follow up with TAPPER,DAVID  B, MD for primary care needs.  Patient is asked to bring meter and  blood glucose logs during their next visit.   Follow up plan: -Return in about 11 weeks (around 04/07/2017) for meter, and logs.  Glade Lloyd, MD Phone: [phone]  Fax: [phone]   01/20/2017, 1:30 PM

## 2017-02-16 ENCOUNTER — Ambulatory Visit (HOSPITAL_COMMUNITY)
Admission: RE | Admit: 2017-02-16 | Discharge: 2017-02-16 | Disposition: A | Discharge status: Home / Self Care | Payer: Medicare Other | Source: Ambulatory Visit | Attending: "Endocrinology | Admitting: "Endocrinology

## 2017-02-16 DIAGNOSIS — N200 Calculus of kidney: Secondary | ICD-10-CM | POA: Insufficient documentation

## 2017-02-16 DIAGNOSIS — R1084 Generalized abdominal pain: Secondary | ICD-10-CM | POA: Insufficient documentation

## 2017-02-16 DIAGNOSIS — I700 Atherosclerosis of aorta: Secondary | ICD-10-CM | POA: Diagnosis not present

## 2017-02-16 DIAGNOSIS — K449 Diaphragmatic hernia without obstruction or gangrene: Secondary | ICD-10-CM | POA: Diagnosis not present

## 2017-02-16 DIAGNOSIS — K5730 Diverticulosis of large intestine without perforation or abscess without bleeding: Secondary | ICD-10-CM | POA: Diagnosis not present

## 2017-02-17 ENCOUNTER — Telehealth: Payer: Self-pay

## 2017-02-17 NOTE — Telephone Encounter (Signed)
Pt is requesting CT results

## 2017-02-18 NOTE — Telephone Encounter (Signed)
Pt notified of Normal Adrenal glands per Dr Dorris Fetch.

## 2017-03-16 DIAGNOSIS — I10 Essential (primary) hypertension: Secondary | ICD-10-CM | POA: Diagnosis not present

## 2017-03-16 DIAGNOSIS — Z794 Long term (current) use of insulin: Secondary | ICD-10-CM | POA: Diagnosis not present

## 2017-03-16 DIAGNOSIS — E1165 Type 2 diabetes mellitus with hyperglycemia: Secondary | ICD-10-CM | POA: Diagnosis not present

## 2017-03-16 DIAGNOSIS — F39 Unspecified mood [affective] disorder: Secondary | ICD-10-CM | POA: Diagnosis not present

## 2017-03-17 ENCOUNTER — Ambulatory Visit (INDEPENDENT_AMBULATORY_CARE_PROVIDER_SITE_OTHER): Payer: Medicare Other | Admitting: "Endocrinology

## 2017-03-17 ENCOUNTER — Encounter: Payer: Self-pay | Admitting: "Endocrinology

## 2017-03-17 VITALS — BP 146/75 | HR 71 | Ht 62.0 in | Wt 203.0 lb

## 2017-03-17 DIAGNOSIS — Z794 Long term (current) use of insulin: Secondary | ICD-10-CM | POA: Diagnosis not present

## 2017-03-17 DIAGNOSIS — E270 Other adrenocortical overactivity: Secondary | ICD-10-CM

## 2017-03-17 DIAGNOSIS — E1165 Type 2 diabetes mellitus with hyperglycemia: Secondary | ICD-10-CM

## 2017-03-17 DIAGNOSIS — E118 Type 2 diabetes mellitus with unspecified complications: Secondary | ICD-10-CM

## 2017-03-17 DIAGNOSIS — I10 Essential (primary) hypertension: Secondary | ICD-10-CM | POA: Diagnosis not present

## 2017-03-17 DIAGNOSIS — IMO0002 Reserved for concepts with insufficient information to code with codable children: Secondary | ICD-10-CM

## 2017-03-17 NOTE — Progress Notes (Signed)
Subjective:    Patient ID: Vicki Moon, female    DOB: [birth year], PCP Deloria Lair, MD   Past Medical History:  Diagnosis Date  . Diabetes mellitus without complication (HCC)    Type 2 IDDM x 5 years  . Glaucoma   . Herniated lumbar intervertebral disc 06/2014  . PONV (postoperative nausea and vomiting)    Past Surgical History:  Procedure Laterality Date  . CHOLECYSTECTOMY    . COLONOSCOPY N/A 06/28/2015   Procedure: COLONOSCOPY;  Surgeon: Rogene Houston, MD;  [facility];  Service: Endoscopy;  Laterality: N/A;  155  . EYE SURGERY    . LUMBAR LAMINECTOMY Right 07/12/2014   Procedure: LUMBAR FIVE TO SACRAL ONE MICRODISCECTOMY;  Surgeon: Marybelle Killings, MD;  Location: [facility];  Service: Orthopedics;  Laterality: Right;  . TUBAL LIGATION     Social History   Social History  . Marital status: Married    Spouse name: N/A  . Number of children: N/A  . Years of education: N/A   Social History Main Topics  . Smoking status: Current Some Day Smoker    Packs/day: 1.00    Years: 10.00    Types: Cigarettes  . Smokeless tobacco: Never Used     Comment:  1/2 pack a day since age 82  . Alcohol use No  . Drug use: No  . Sexual activity: Yes   Other Topics Concern  . None   Social History Narrative  . None   Outpatient Encounter Prescriptions as of 03/17/2017  Medication Sig  . PARoxetine (PAXIL) 20 MG tablet Take 20 mg by mouth daily.  Marland Kitchen aspirin 81 MG tablet Take 81 mg by mouth daily.  . B-D ULTRAFINE III SHORT PEN 31G X 8 MM MISC USE ONE  4 TIMES DAILY  . glucose blood (ONE TOUCH TEST STRIPS) test strip Use as instructed  . Insulin Glargine (LANTUS SOLOSTAR [place]) Inject 80 Units into the skin at bedtime.  . insulin lispro (HUMALOG KWIKPEN) 100 UNIT/ML KiwkPen Inject 0.18-0.24 mLs (18-24 Units total) into the skin 3 (three) times daily with meals.  . latanoprost (XALATAN) 0.005 % ophthalmic solution Place 1 drop into both eyes at bedtime.  Marland Kitchen lisinopril  (PRINIVIL,ZESTRIL) 10 MG tablet Take 1 tablet (10 mg total) by mouth daily.  . Multiple Vitamins-Minerals (PRESERVISION AREDS PO) Take by mouth 2 (two) times daily.   No facility-administered encounter medications on file as of 03/17/2017.    ALLERGIES: No Known Allergies VACCINATION STATUS:  There is no immunization history on file for this patient.  Diabetes  She presents for her follow-up diabetic visit. She has type 2 diabetes mellitus. Onset time: She was diagnosed at approximate age of 41 years. Her disease course has been improving. There are no hypoglycemic associated symptoms. Pertinent negatives for hypoglycemia include no confusion, headaches, pallor or seizures. There are no diabetic associated symptoms. Pertinent negatives for diabetes include no chest pain, no polydipsia, no polyphagia and no polyuria. There are no hypoglycemic complications. Symptoms are improving. There are no diabetic complications. Risk factors for coronary artery disease include diabetes mellitus, hypertension, sedentary lifestyle and tobacco exposure. Current diabetic treatment includes insulin injections and oral agent (monotherapy). She is compliant with treatment most of the time. Her weight is increasing steadily. She is following a generally unhealthy diet. When asked about meal planning, she reported none. She has had a previous visit with a dietitian. She never participates in exercise. Her home blood glucose trend  is decreasing steadily. Her breakfast blood glucose range is generally 140-180 mg/dl. Her lunch blood glucose range is generally 140-180 mg/dl. Her dinner blood glucose range is generally 140-180 mg/dl. Her overall blood glucose range is 140-180 mg/dl. An ACE inhibitor/angiotensin II receptor blocker is being taken.  Hypertension  This is a chronic problem. The current episode started more than 1 year ago. The problem is controlled. Pertinent negatives include no chest pain, headaches, palpitations  or shortness of breath. Risk factors for coronary artery disease include smoking/tobacco exposure, diabetes mellitus and sedentary lifestyle. Past treatments include ACE inhibitors. The current treatment provides moderate improvement.     Review of Systems  Constitutional: Negative for chills, fever and unexpected weight change.  HENT: Positive for sinus pressure. Negative for trouble swallowing and voice change.   Eyes: Negative for visual disturbance.  Respiratory: Negative for cough, shortness of breath and wheezing.   Cardiovascular: Negative for chest pain, palpitations and leg swelling.  Gastrointestinal: Negative for diarrhea, nausea and vomiting.  Endocrine: Negative for cold intolerance, heat intolerance, polydipsia, polyphagia and polyuria.  Musculoskeletal: Negative for arthralgias and myalgias.  Skin: Negative for color change, pallor, rash and wound.  Neurological: Negative for seizures and headaches.  Psychiatric/Behavioral: Negative for confusion and suicidal ideas.    Objective:    BP (!) 146/75   Pulse 71   Ht 5\' 2"  (1.575 m)   Wt 203 lb (92.1 kg)   BMI 37.13 kg/m   Wt Readings from Last 3 Encounters:  03/17/17 203 lb (92.1 kg)  01/20/17 197 lb (89.4 kg)  01/01/17 196 lb (88.9 kg)    Physical Exam  Constitutional: She is oriented to person, place, and time. She appears well-developed.  HENT:  Head: Normocephalic and atraumatic.  Eyes: EOM are normal.  Neck: Normal range of motion. Neck supple. No tracheal deviation present. No thyromegaly present.  Cardiovascular: Normal rate and regular rhythm.   Pulmonary/Chest: Effort normal and breath sounds normal.  Abdominal: Soft. Bowel sounds are normal. There is no tenderness. There is no guarding.  Musculoskeletal: Normal range of motion. She exhibits no edema.  Neurological: She is alert and oriented to person, place, and time. She has normal reflexes. No cranial nerve deficit. Coordination normal.  Skin: Skin is  warm and dry. No rash noted. No erythema. No pallor.  Psychiatric: She has a normal mood and affect. Judgment normal.     CMP     Component Value Date/Time   NA 140 12/24/2016 0947   K 4.1 12/24/2016 0947   CL 105 12/24/2016 0947   CO2 27 12/24/2016 0947   GLUCOSE 173 (H) 12/24/2016 0947   BUN 15 12/24/2016 0947   CREATININE 0.58 12/24/2016 0947   CALCIUM 9.0 12/24/2016 0947   PROT 6.5 12/24/2016 0947   ALBUMIN 3.6 12/24/2016 0947   AST 13 12/24/2016 0947   ALT 14 12/24/2016 0947   ALKPHOS 138 (H) 12/24/2016 0947   BILITOT 0.3 12/24/2016 0947   GFRNONAA >89 09/15/2016 0945   GFRAA >89 09/15/2016 0945     Diabetic Labs (most recent): Lab Results  Component Value Date   HGBA1C 9.4 (H) 12/24/2016   HGBA1C 8.6 (H) 09/15/2016   HGBA1C 8.8 (H) 06/16/2016   Assessment & Plan:   1. Uncontrolled type 2 diabetes mellitus with complication, with long-term current use of insulin ([place]) - patient remains at a high risk for more acute and chronic complications of diabetes which include CAD, CVA, CKD, retinopathy, and neuropathy. These are all discussed  in detail with the patient.  Patient came with Improving glycemic profile,   her most recent A1c was increasing  to 9.4% from 8.6 %.   Glucose logs and insulin administration records pertaining to this visit,  to be scanned into patient's records.  Recent labs reviewed.   - I have re-counseled the patient on diet management  by adopting a carbohydrate restricted / protein rich  Diet.  - Suggestion is made for patient to avoid simple carbohydrates   from their diet including Cakes , Desserts, Ice Cream,  Soda (  diet and regular) , Sweet Tea , Candies,  Chips, Cookies, Artificial Sweeteners,   and "Sugar-free" Products .  This will help patient to have stable blood glucose profile and potentially avoid unintended  Weight gain.  - Patient is advised to stick to a routine mealtimes to eat 3 meals  a day and avoid unnecessary snacks ( to  snack only to correct hypoglycemia).  - The patient  has been  scheduled with Jearld Fenton, RDN, CDE for individualized DM education.  - I have approached patient with the following individualized plan to manage diabetes and patient agrees.  - She admits she has had dietary indiscretion. I will continue  Lantus 80 units daily at bedtime,  Humalog  18 units 3 times a day before meals for  pre-meal BG readings of 90-150mg /dl, plus patient specific correction dose of rapid acting insulin  for unexpected hyperglycemia above 150mg /dl, associated with strict monitoring of glucose  AC and HS. - Patient is warned not to take insulin without proper monitoring per orders. -Adjustment parameters are given for hypo and hyperglycemia in writing. -Patient is encouraged to call clinic for blood glucose levels less than 70 or above 300 mg /dl. - She  did not tolerate metformin, her GI complaints have disappeared after she stopped metformin.   - I advised her to stay off of metformin.  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 -  CT scan of her abdomen is negative for adrenal neoplastic process, whenever positive for diffuse diverticulosis, small left nephrolithiasis, and  calcified aortic atherosclerosis.  - She may benefit from cortisol lowering therapy- Korlym, would initiate discussion regarding her insurance coverage. - Anti-islet cell antibodies undetectable.   - She is not suitable candidate for incretin therapy for now - Patient specific target  for A1c; LDL, HDL, Triglycerides, and  Waist Circumference were discussed in detail.  2) BP/HTN: Controlled . Continue current medications including ACEI/ARB. 3) Lipids/HPL:  she is not on statins, I would obtain fasting lipid panel before her next visit.  4)  Weight/Diet: She continued to see weight increase progressively . CDE consult in progress, exercise, and carbohydrates information provided.  5.  Vitamin D deficiency; she is status post vitamin D replacement.  6) Chronic Care/Health Maintenance:  -Patient  is on ACEI/ARB medications and encouraged to continue to follow up with Ophthalmology, Podiatrist at least yearly or according to recommendations, and advised to quit smoking. I have recommended yearly flu vaccine and pneumonia vaccination at least every 5 years; moderate intensity exercise for up to 150 minutes weekly; and  sleep for at least 7 hours a day.  - 25 minutes of time was spent on the care of this patient , 50% of which was applied for counseling on diabetes complications and their preventions.  - I advised patient to maintain close follow up with TAPPER,DAVID B, MD for primary care needs.  Patient is  asked to bring meter and  blood glucose logs during their next visit.   Follow up plan: -Return in about 4 weeks (around 04/14/2017) for meter, and logs, Keep Regular Appointment.  Glade Lloyd, MD Phone: [phone]  Fax: [phone]   03/17/2017, 11:14 AM

## 2017-03-30 ENCOUNTER — Other Ambulatory Visit: Payer: Self-pay | Admitting: "Endocrinology

## 2017-04-06 ENCOUNTER — Other Ambulatory Visit: Payer: Self-pay | Admitting: "Endocrinology

## 2017-04-06 DIAGNOSIS — Z794 Long term (current) use of insulin: Secondary | ICD-10-CM | POA: Diagnosis not present

## 2017-04-06 DIAGNOSIS — E1165 Type 2 diabetes mellitus with hyperglycemia: Secondary | ICD-10-CM | POA: Diagnosis not present

## 2017-04-06 DIAGNOSIS — E118 Type 2 diabetes mellitus with unspecified complications: Secondary | ICD-10-CM | POA: Diagnosis not present

## 2017-04-06 LAB — COMPREHENSIVE METABOLIC PANEL
ALT: 12 U/L (ref 6–29)
AST: 12 U/L (ref 10–35)
Albumin: 3.3 g/dL — ABNORMAL LOW (ref 3.6–5.1)
Alkaline Phosphatase: 125 U/L (ref 33–130)
BILIRUBIN TOTAL: 0.2 mg/dL (ref 0.2–1.2)
BUN: 10 mg/dL (ref 7–25)
CO2: 29 mmol/L (ref 20–31)
Calcium: 8.7 mg/dL (ref 8.6–10.4)
Chloride: 105 mmol/L (ref 98–110)
Creat: 0.45 mg/dL — ABNORMAL LOW (ref 0.50–0.99)
GLUCOSE: 158 mg/dL — AB (ref 65–99)
Potassium: 4.2 mmol/L (ref 3.5–5.3)
SODIUM: 140 mmol/L (ref 135–146)
Total Protein: 6.3 g/dL (ref 6.1–8.1)

## 2017-04-07 LAB — HEMOGLOBIN A1C
Hgb A1c MFr Bld: 8 % — ABNORMAL HIGH (ref <5.7)
Mean Plasma Glucose: 183 mg/dL

## 2017-04-13 ENCOUNTER — Encounter: Payer: Self-pay | Admitting: "Endocrinology

## 2017-04-13 ENCOUNTER — Ambulatory Visit (INDEPENDENT_AMBULATORY_CARE_PROVIDER_SITE_OTHER): Payer: Medicare Other | Admitting: "Endocrinology

## 2017-04-13 VITALS — BP 136/73 | HR 80 | Ht 62.0 in | Wt 204.0 lb

## 2017-04-13 DIAGNOSIS — Z794 Long term (current) use of insulin: Secondary | ICD-10-CM

## 2017-04-13 DIAGNOSIS — I10 Essential (primary) hypertension: Secondary | ICD-10-CM | POA: Diagnosis not present

## 2017-04-13 DIAGNOSIS — E1165 Type 2 diabetes mellitus with hyperglycemia: Secondary | ICD-10-CM | POA: Diagnosis not present

## 2017-04-13 DIAGNOSIS — E118 Type 2 diabetes mellitus with unspecified complications: Secondary | ICD-10-CM

## 2017-04-13 DIAGNOSIS — IMO0002 Reserved for concepts with insufficient information to code with codable children: Secondary | ICD-10-CM

## 2017-04-13 NOTE — Progress Notes (Signed)
Subjective:    Patient ID: Vicki Moon, female    DOB: 08/14/1951, PCP Deloria Lair, MD   Past Medical History:  Diagnosis Date  . Diabetes mellitus without complication (HCC)    Type 2 IDDM x 5 years  . Glaucoma   . Herniated lumbar intervertebral disc 06/2014  . PONV (postoperative nausea and vomiting)    Past Surgical History:  Procedure Laterality Date  . CHOLECYSTECTOMY    . COLONOSCOPY N/A 06/28/2015   Procedure: COLONOSCOPY;  Surgeon: Rogene Houston, MD;  [facility];  Service: Endoscopy;  Laterality: N/A;  155  . EYE SURGERY    . LUMBAR LAMINECTOMY Right 07/12/2014   Procedure: LUMBAR FIVE TO SACRAL ONE MICRODISCECTOMY;  Surgeon: Marybelle Killings, MD;  Location: [facility];  Service: Orthopedics;  Laterality: Right;  . TUBAL LIGATION     Social History   Social History  . Marital status: Married    Spouse name: N/A  . Number of children: N/A  . Years of education: N/A   Social History Main Topics  . Smoking status: Current Some Day Smoker    Packs/day: 1.00    Years: 10.00    Types: Cigarettes  . Smokeless tobacco: Never Used     Comment:  1/2 pack a day since age 37  . Alcohol use No  . Drug use: No  . Sexual activity: Yes   Other Topics Concern  . None   Social History Narrative  . None   Outpatient Encounter Prescriptions as of 04/13/2017  Medication Sig  . aspirin 81 MG tablet Take 81 mg by mouth daily.  . B-D ULTRAFINE III SHORT PEN 31G X 8 MM MISC USE ONE  4 TIMES DAILY  . Insulin Glargine (LANTUS SOLOSTAR [place]) Inject 80 Units into the skin at bedtime.  . insulin lispro (HUMALOG KWIKPEN) 100 UNIT/ML KiwkPen Inject 0.18-0.24 mLs (18-24 Units total) into the skin 3 (three) times daily with meals.  . latanoprost (XALATAN) 0.005 % ophthalmic solution Place 1 drop into both eyes at bedtime.  Marland Kitchen lisinopril (PRINIVIL,ZESTRIL) 10 MG tablet Take 1 tablet (10 mg total) by mouth daily.  . Multiple Vitamins-Minerals (PRESERVISION AREDS PO) Take by  mouth 2 (two) times daily.  Glory Rosebush VERIO test strip USE ONE STRIP TO CHECK GLUCOSE 4 TIMES DAILY  . PARoxetine (PAXIL) 20 MG tablet Take 20 mg by mouth daily.   No facility-administered encounter medications on file as of 04/13/2017.    ALLERGIES: No Known Allergies VACCINATION STATUS:  There is no immunization history on file for this patient.  Diabetes  She presents for her follow-up diabetic visit. She has type 2 diabetes mellitus. Onset time: She was diagnosed at approximate age of 66 years. Her disease course has been improving. There are no hypoglycemic associated symptoms. Pertinent negatives for hypoglycemia include no confusion, headaches, pallor or seizures. There are no diabetic associated symptoms. Pertinent negatives for diabetes include no chest pain, no polydipsia, no polyphagia and no polyuria. There are no hypoglycemic complications. Symptoms are improving. There are no diabetic complications. Risk factors for coronary artery disease include diabetes mellitus, hypertension, sedentary lifestyle and tobacco exposure. Current diabetic treatment includes insulin injections and oral agent (monotherapy). She is compliant with treatment most of the time. Her weight is increasing steadily. She is following a generally unhealthy diet. When asked about meal planning, she reported none. She has had a previous visit with a dietitian. She never participates in exercise. Her home blood  glucose trend is decreasing steadily. Her breakfast blood glucose range is generally 140-180 mg/dl. Her lunch blood glucose range is generally 140-180 mg/dl. Her dinner blood glucose range is generally 140-180 mg/dl. Her overall blood glucose range is 140-180 mg/dl. An ACE inhibitor/angiotensin II receptor blocker is being taken.  Hypertension  This is a chronic problem. The current episode started more than 1 year ago. The problem is controlled. Pertinent negatives include no chest pain, headaches, palpitations  or shortness of breath. Risk factors for coronary artery disease include smoking/tobacco exposure, diabetes mellitus and sedentary lifestyle. Past treatments include ACE inhibitors. The current treatment provides moderate improvement.     Review of Systems  Constitutional: Negative for chills, fever and unexpected weight change.  HENT: Positive for sinus pressure. Negative for trouble swallowing and voice change.   Eyes: Negative for visual disturbance.  Respiratory: Negative for cough, shortness of breath and wheezing.   Cardiovascular: Negative for chest pain, palpitations and leg swelling.  Gastrointestinal: Negative for diarrhea, nausea and vomiting.  Endocrine: Negative for cold intolerance, heat intolerance, polydipsia, polyphagia and polyuria.  Musculoskeletal: Negative for arthralgias and myalgias.  Skin: Negative for color change, pallor, rash and wound.  Neurological: Negative for seizures and headaches.  Psychiatric/Behavioral: Negative for confusion and suicidal ideas.    Objective:    BP 136/73   Pulse 80   Ht 5\' 2"  (1.575 m)   Wt 204 lb (92.5 kg)   BMI 37.31 kg/m   Wt Readings from Last 3 Encounters:  04/13/17 204 lb (92.5 kg)  03/17/17 203 lb (92.1 kg)  01/20/17 197 lb (89.4 kg)    Physical Exam  Constitutional: She is oriented to person, place, and time. She appears well-developed.  HENT:  Head: Normocephalic and atraumatic.  Eyes: EOM are normal.  Neck: Normal range of motion. Neck supple. No tracheal deviation present. No thyromegaly present.  Cardiovascular: Normal rate and regular rhythm.   Pulmonary/Chest: Effort normal and breath sounds normal.  Abdominal: Soft. Bowel sounds are normal. There is no tenderness. There is no guarding.  Musculoskeletal: Normal range of motion. She exhibits no edema.  Neurological: She is alert and oriented to person, place, and time. She has normal reflexes. No cranial nerve deficit. Coordination normal.  Skin: Skin is warm  and dry. No rash noted. No erythema. No pallor.  Psychiatric: She has a normal mood and affect. Judgment normal.     CMP     Component Value Date/Time   NA 140 04/06/2017 0900   K 4.2 04/06/2017 0900   CL 105 04/06/2017 0900   CO2 29 04/06/2017 0900   GLUCOSE 158 (H) 04/06/2017 0900   BUN 10 04/06/2017 0900   CREATININE 0.45 (L) 04/06/2017 0900   CALCIUM 8.7 04/06/2017 0900   PROT 6.3 04/06/2017 0900   ALBUMIN 3.3 (L) 04/06/2017 0900   AST 12 04/06/2017 0900   ALT 12 04/06/2017 0900   ALKPHOS 125 04/06/2017 0900   BILITOT 0.2 04/06/2017 0900   GFRNONAA >89 09/15/2016 0945   GFRAA >89 09/15/2016 0945     Diabetic Labs (most recent): Lab Results  Component Value Date   HGBA1C 8.0 (H) 04/06/2017   HGBA1C 9.4 (H) 12/24/2016   HGBA1C 8.6 (H) 09/15/2016   Assessment & Plan:   1. Uncontrolled type 2 diabetes mellitus with complication, with long-term current use of insulin ([place]) - patient remains at a high risk for more acute and chronic complications of diabetes which include CAD, CVA, CKD, retinopathy, and neuropathy. These are  all discussed in detail with the patient.  Patient came with Improving glycemic profile,   her most recent A1c is 8%, slowly improving from 9.8%.    Glucose logs and insulin administration records pertaining to this visit,  to be scanned into patient's records.  Recent labs reviewed.   - I have re-counseled the patient on diet management  by adopting a carbohydrate restricted / protein rich  Diet.  - Suggestion is made for patient to avoid simple carbohydrates   from their diet including Cakes , Desserts, Ice Cream,  Soda (  diet and regular) , Sweet Tea , Candies,  Chips, Cookies, Artificial Sweeteners,   and "Sugar-free" Products .  This will help patient to have stable blood glucose profile and potentially avoid unintended  Weight gain.  - Patient is advised to stick to a routine mealtimes to eat 3 meals  a day and avoid unnecessary snacks ( to  snack only to correct hypoglycemia).  - The patient  has been  scheduled with Jearld Fenton, RDN, CDE for individualized DM education.  - I have approached patient with the following individualized plan to manage diabetes and patient agrees.  - She admits she has had dietary indiscretion. I will continue  Lantus 80 units daily at bedtime,  Humalog  18 units 3 times a day before meals for  pre-meal BG readings of 90-150mg /dl, plus patient specific correction dose of rapid acting insulin  for unexpected hyperglycemia above 150mg /dl, associated with strict monitoring of glucose  AC and HS. - Patient is warned not to take insulin without proper monitoring per orders. -Adjustment parameters are given for hypo and hyperglycemia in writing. -Patient is encouraged to call clinic for blood glucose levels less than 70 or above 300 mg /dl. - She  did not tolerate metformin, her GI complaints have disappeared after she stopped metformin.   - I advised her to stay off of metformin.  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 -  CT scan of her abdomen is negative for adrenal neoplastic process,  positive for diffuse diverticulosis, small left nephrolithiasis, and  calcified aortic atherosclerosis.  - She may benefit from cortisol lowering therapy- Kor[facility]m, would initiate discussion regarding her insurance coverage.  - Anti-islet cell antibodies undetectable.   - She is not suitable candidate for incretin therapy for now - Patient specific target  for A1c; LDL, HDL, Triglycerides, and  Waist Circumference were discussed in detail.  2) BP/HTN: Controlled . Continue current medications including ACEI/ARB. 3) Lipids/HPL: Controlled, LDL 84,  she is not on statins. 4)  Weight/Diet: She has steady weight since last visit after she has had progressive weight gain of 20 pounds over 7 years.  CDE consult in progress, exercise, and carbohydrates information  provided.  5. Vitamin D deficiency; she is status post vitamin D replacement.  6) Chronic Care/Health Maintenance:  -Patient  is on ACEI/ARB medications and encouraged to continue to follow up with Ophthalmology, Podiatrist at least yearly or according to recommendations, and advised to quit smoking. I have recommended yearly flu vaccine and pneumonia vaccination at least every 5 years; moderate intensity exercise for up to 150 minutes weekly; and  sleep for at least 7 hours a day.  - 25 minutes of time was spent on the care of this patient , 50% of which was applied for counseling on diabetes complications and their preventions.  - I advised patient to maintain close follow up with TAPPER,DAVID B, MD  for primary care needs.  Patient is asked to bring meter and  blood glucose logs during their next visit.   Follow up plan: -Return in about 3 months (around 07/13/2017) for follow up with pre-visit labs, meter, and logs.  Glade Lloyd, MD Phone: [phone]  Fax: [phone]   04/13/2017, 11:08 AM

## 2017-04-13 NOTE — Patient Instructions (Signed)

## 2017-04-29 ENCOUNTER — Other Ambulatory Visit: Payer: Self-pay | Admitting: "Endocrinology

## 2017-05-11 DIAGNOSIS — H401132 Primary open-angle glaucoma, bilateral, moderate stage: Secondary | ICD-10-CM | POA: Diagnosis not present

## 2017-05-28 ENCOUNTER — Other Ambulatory Visit: Payer: Self-pay | Admitting: "Endocrinology

## 2017-07-06 ENCOUNTER — Other Ambulatory Visit: Payer: Self-pay | Admitting: "Endocrinology

## 2017-07-06 DIAGNOSIS — E1165 Type 2 diabetes mellitus with hyperglycemia: Secondary | ICD-10-CM

## 2017-07-06 DIAGNOSIS — E118 Type 2 diabetes mellitus with unspecified complications: Principal | ICD-10-CM

## 2017-07-07 DIAGNOSIS — E118 Type 2 diabetes mellitus with unspecified complications: Secondary | ICD-10-CM | POA: Diagnosis not present

## 2017-07-07 DIAGNOSIS — E1165 Type 2 diabetes mellitus with hyperglycemia: Secondary | ICD-10-CM | POA: Diagnosis not present

## 2017-07-07 LAB — COMPREHENSIVE METABOLIC PANEL
ALT: 12 U/L (ref 6–29)
AST: 11 U/L (ref 10–35)
Albumin: 3.7 g/dL (ref 3.6–5.1)
Alkaline Phosphatase: 105 U/L (ref 33–130)
BUN: 8 mg/dL (ref 7–25)
CHLORIDE: 105 mmol/L (ref 98–110)
CO2: 28 mmol/L (ref 20–31)
Calcium: 8.7 mg/dL (ref 8.6–10.4)
Creat: 0.45 mg/dL — ABNORMAL LOW (ref 0.50–0.99)
GLUCOSE: 96 mg/dL (ref 65–99)
POTASSIUM: 3.9 mmol/L (ref 3.5–5.3)
Sodium: 140 mmol/L (ref 135–146)
Total Bilirubin: 0.4 mg/dL (ref 0.2–1.2)
Total Protein: 6.5 g/dL (ref 6.1–8.1)

## 2017-07-08 LAB — HEMOGLOBIN A1C
Hgb A1c MFr Bld: 8.1 % — ABNORMAL HIGH (ref <5.7)
Mean Plasma Glucose: 186 mg/dL

## 2017-07-13 ENCOUNTER — Ambulatory Visit (INDEPENDENT_AMBULATORY_CARE_PROVIDER_SITE_OTHER): Payer: Medicare Other | Admitting: "Endocrinology

## 2017-07-13 ENCOUNTER — Encounter: Payer: Self-pay | Admitting: "Endocrinology

## 2017-07-13 VITALS — BP 136/71 | HR 64 | Ht 62.0 in | Wt 201.0 lb

## 2017-07-13 DIAGNOSIS — E1165 Type 2 diabetes mellitus with hyperglycemia: Secondary | ICD-10-CM | POA: Diagnosis not present

## 2017-07-13 DIAGNOSIS — Z794 Long term (current) use of insulin: Secondary | ICD-10-CM

## 2017-07-13 DIAGNOSIS — IMO0002 Reserved for concepts with insufficient information to code with codable children: Secondary | ICD-10-CM

## 2017-07-13 DIAGNOSIS — E782 Mixed hyperlipidemia: Secondary | ICD-10-CM | POA: Diagnosis not present

## 2017-07-13 DIAGNOSIS — I10 Essential (primary) hypertension: Secondary | ICD-10-CM | POA: Diagnosis not present

## 2017-07-13 DIAGNOSIS — E118 Type 2 diabetes mellitus with unspecified complications: Secondary | ICD-10-CM | POA: Diagnosis not present

## 2017-07-13 DIAGNOSIS — E66812 Obesity, class 2: Secondary | ICD-10-CM

## 2017-07-13 DIAGNOSIS — Z6837 Body mass index (BMI) 37.0-37.9, adult: Secondary | ICD-10-CM | POA: Diagnosis not present

## 2017-07-13 MED ORDER — PHENTERMINE-TOPIRAMATE ER 7.5-46 MG PO CP24: 1.0000 | ORAL_CAPSULE | Freq: Every day | ORAL | 2 refills | Status: DC

## 2017-07-13 MED ORDER — PHENTERMINE-TOPIRAMATE ER 3.75-23 MG PO CP24: 1.0000 | ORAL_CAPSULE | Freq: Every day | ORAL | 0 refills | Status: DC

## 2017-07-13 NOTE — Patient Instructions (Signed)

## 2017-07-13 NOTE — Progress Notes (Signed)
Subjective:    Patient ID: Vicki Moon, female    DOB: 1951/11/15, PCP Vicki Lair., MD   Past Medical History:  Diagnosis Date  . Diabetes mellitus without complication (HCC)    Type 2 IDDM x 5 years  . Glaucoma   . Herniated lumbar intervertebral disc 06/2014  . PONV (postoperative nausea and vomiting)    Past Surgical History:  Procedure Laterality Date  . CHOLECYSTECTOMY    . COLONOSCOPY N/A 06/28/2015   Procedure: COLONOSCOPY;  Surgeon: Rogene Houston, MD;  Location: [facility];  Service: Endoscopy;  Laterality: N/A;  155  . EYE SURGERY    . LUMBAR LAMINECTOMY Right 07/12/2014   Procedure: LUMBAR FIVE TO SACRAL ONE MICRODISCECTOMY;  Surgeon: Marybelle Killings, MD;  Location: [facility];  Service: Orthopedics;  Laterality: Right;  . TUBAL LIGATION     Social History   Social History  . Marital status: Married    Spouse name: N/A  . Number of children: N/A  . Years of education: N/A   Social History Main Topics  . Smoking status: Current Some Day Smoker    Packs/day: 1.00    Years: 10.00    Types: Cigarettes  . Smokeless tobacco: Never Used     Comment:  1/2 pack a day since age 33  . Alcohol use No  . Drug use: No  . Sexual activity: Yes   Other Topics Concern  . None   Social History Narrative  . None   Outpatient Encounter Prescriptions as of 07/13/2017  Medication Sig  . aspirin 81 MG tablet Take 81 mg by mouth daily.  . B-D ULTRAFINE III SHORT PEN 31G X 8 MM MISC USE ONE  4 TIMES DAILY  . Insulin Glargine (LANTUS SOLOSTAR [place]) Inject 80 Units into the skin at bedtime.  . insulin lispro (HUMALOG KWIKPEN) 100 UNIT/ML KiwkPen Inject 0.18-0.24 mLs (18-24 Units total) into the skin 3 (three) times daily with meals.  Marland Kitchen LANTUS SOLOSTAR 100 UNIT/ML Solostar Pen INJECT 80 UNITS SUBCUTANEOUSLY ONCE DAILY AT 10 OCLOCK IN THE EVENING  . latanoprost (XALATAN) 0.005 % ophthalmic solution Place 1 drop into both eyes at bedtime.  Marland Kitchen lisinopril (PRINIVIL,ZESTRIL) 10  MG tablet TAKE 1 TABLET BY MOUTH ONCE DAILY  . Multiple Vitamins-Minerals (PRESERVISION AREDS PO) Take by mouth 2 (two) times daily.  Glory Rosebush VERIO test strip USE ONE STRIP TO CHECK GLUCOSE 4 TIMES DAILY  . PARoxetine (PAXIL) 20 MG tablet Take 20 mg by mouth daily.  . Phentermine-Topiramate (QSYMIA) 7.5-46 MG CP24 Take 1 capsule by mouth daily.  . Phentermine-Topiramate 3.75-23 MG CP24 Take 1 capsule by mouth daily.   No facility-administered encounter medications on file as of 07/13/2017.    ALLERGIES: No Known Allergies VACCINATION STATUS:  There is no immunization history on file for this patient.  Diabetes  She presents for her follow-up diabetic visit. She has type 2 diabetes mellitus. Onset time: She was diagnosed at approximate age of 58 years. Her disease course has been stable. There are no hypoglycemic associated symptoms. Pertinent negatives for hypoglycemia include no confusion, headaches, pallor or seizures. There are no diabetic associated symptoms. Pertinent negatives for diabetes include no chest pain, no polydipsia, no polyphagia and no polyuria. There are no hypoglycemic complications. Symptoms are stable. There are no diabetic complications. Risk factors for coronary artery disease include diabetes mellitus, hypertension, sedentary lifestyle and tobacco exposure. Current diabetic treatment includes insulin injections and oral agent (monotherapy). She is  compliant with treatment most of the time. Her weight is stable. She is following a generally unhealthy diet. When asked about meal planning, she reported none. She has had a previous visit with a dietitian. She never participates in exercise. Her home blood glucose trend is decreasing steadily. Her breakfast blood glucose range is generally 140-180 mg/dl. Her lunch blood glucose range is generally 140-180 mg/dl. Her dinner blood glucose range is generally 140-180 mg/dl. Her overall blood glucose range is 140-180 mg/dl. An ACE  inhibitor/angiotensin II receptor blocker is being taken.  Hypertension  This is a chronic problem. The current episode started more than 1 year ago. The problem is controlled. Pertinent negatives include no chest pain, headaches, palpitations or shortness of breath. Risk factors for coronary artery disease include smoking/tobacco exposure, diabetes mellitus and sedentary lifestyle. Past treatments include ACE inhibitors. The current treatment provides moderate improvement.     Review of Systems  Constitutional: Negative for chills, fever and unexpected weight change.  HENT: Positive for sinus pressure. Negative for trouble swallowing and voice change.   Eyes: Negative for visual disturbance.  Respiratory: Negative for cough, shortness of breath and wheezing.   Cardiovascular: Negative for chest pain, palpitations and leg swelling.  Gastrointestinal: Negative for diarrhea, nausea and vomiting.  Endocrine: Negative for cold intolerance, heat intolerance, polydipsia, polyphagia and polyuria.  Musculoskeletal: Negative for arthralgias and myalgias.  Skin: Negative for color change, pallor, rash and wound.  Neurological: Negative for seizures and headaches.  Psychiatric/Behavioral: Negative for confusion and suicidal ideas.    Objective:    BP 136/71   Pulse 64   Ht 5\' 2"  (1.575 m)   Wt 201 lb (91.2 kg)   BMI 36.76 kg/m   Wt Readings from Last 3 Encounters:  07/13/17 201 lb (91.2 kg)  04/13/17 204 lb (92.5 kg)  03/17/17 203 lb (92.1 kg)    Physical Exam  Constitutional: She is oriented to person, place, and time. She appears well-developed.  HENT:  Head: Normocephalic and atraumatic.  Eyes: EOM are normal.  Neck: Normal range of motion. Neck supple. No tracheal deviation present. No thyromegaly present.  Cardiovascular: Normal rate and regular rhythm.   Pulmonary/Chest: Effort normal and breath sounds normal.  Abdominal: Soft. Bowel sounds are normal. There is no tenderness.  There is no guarding.  Musculoskeletal: Normal range of motion. She exhibits no edema.  Neurological: She is alert and oriented to person, place, and time. She has normal reflexes. No cranial nerve deficit. Coordination normal.  Skin: Skin is warm and dry. No rash noted. No erythema. No pallor.  Psychiatric: She has a normal mood and affect. Judgment normal.     CMP     Component Value Date/Time   NA 140 07/07/2017 0832   K 3.9 07/07/2017 0832   CL 105 07/07/2017 0832   CO2 28 07/07/2017 0832   GLUCOSE 96 07/07/2017 0832   BUN 8 07/07/2017 0832   CREATININE 0.45 (L) 07/07/2017 0832   CALCIUM 8.7 07/07/2017 0832   PROT 6.5 07/07/2017 0832   ALBUMIN 3.7 07/07/2017 0832   AST 11 07/07/2017 0832   ALT 12 07/07/2017 0832   ALKPHOS 105 07/07/2017 0832   BILITOT 0.4 07/07/2017 0832   GFRNONAA >89 09/15/2016 0945   GFRAA >89 09/15/2016 0945     Diabetic Labs (most recent): Lab Results  Component Value Date   HGBA1C 8.1 (H) 07/07/2017   HGBA1C 8.0 (H) 04/06/2017   HGBA1C 9.4 (H) 12/24/2016   Assessment & Plan:  1. Uncontrolled type 2 diabetes mellitus with complication, with long-term current use of insulin ([place]) - patient remains at a high risk for more acute and chronic complications of diabetes which include CAD, CVA, CKD, retinopathy, and neuropathy. These are all discussed in detail with the patient.  Patient came with Above target glycemic profile,   her most recent A1c is 8.1% unchanged from last visit, slowly improving from 9.8%.    Glucose logs and insulin administration records pertaining to this visit,  to be scanned into patient's records.  Recent labs reviewed.   - I have re-counseled the patient on diet management  by adopting a carbohydrate restricted / protein rich  Diet.  - Suggestion is made for patient to avoid simple carbohydrates   from her diet including Cakes , Desserts, Ice Cream,  Soda (  diet and regular) , Sweet Tea , Candies,  Chips, Cookies,  Artificial Sweeteners,   and "Sugar-free" Products .  This will help patient to have stable blood glucose profile and potentially avoid unintended  Weight gain.  - Patient is advised to stick to a routine mealtimes to eat 3 meals  a day and avoid unnecessary snacks ( to snack only to correct hypoglycemia).  - The patient  has been  scheduled with Jearld Fenton, RDN, CDE for individualized DM education.  - I have approached patient with the following individualized plan to manage diabetes and patient agrees.  - She admits she has had dietary indiscretion. I will continue  Lantus 80 units daily at bedtime,  Humalog  18 units 3 times a day before meals for  pre-meal BG readings of 90-150mg /dl, plus patient specific correction dose of rapid acting insulin  for unexpected hyperglycemia above 150mg /dl, associated with strict monitoring of glucose  AC and HS. - Patient is warned not to take insulin without proper monitoring per orders. -Adjustment parameters are given for hypo and hyperglycemia in writing. -Patient is encouraged to call clinic for blood glucose levels less than 70 or above 300 mg /dl. - She  did not tolerate metformin, her GI complaints have disappeared after she stopped metformin.   - I advised her to stay off of metformin.  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 -  CT scan of her abdomen is negative for adrenal neoplastic process,  positive for diffuse diverticulosis, small left nephrolithiasis, and  calcified aortic atherosclerosis.  - She may benefit from cortisol lowering therapy- [place], would initiate discussion regarding her insurance coverage.  - Anti-islet cell antibodies undetectable.   - She is not suitable candidate for incretin therapy for now - Patient specific target  for A1c; LDL, HDL, Triglycerides, and  Waist Circumference were discussed in detail.  2) BP/HTN: Controlled . Continue current medications  including ACEI/ARB. 3) Lipids/HPL: Controlled, LDL 84,  she is not on statins.  4)  Weight/Diet: She has steady weight since last visit after she has had progressive weight gain of 20 pounds over 7 years.  CDE consult in progress, exercise, and carbohydrates information provided. - She is interested in getting weight loss medications prescription. She reports that she was cleared from glaucoma, she is not taking antidepressants. She may benefit from one of the recently approved weight loss medications. I discussed and initiated Qsymia for her.  5. Vitamin D deficiency; she is status post vitamin D replacement.  6) Chronic Care/Health Maintenance:  -Patient  is on ACEI/ARB medications and encouraged to continue to follow up with Ophthalmology,  Podiatrist at least yearly or according to recommendations, and advised to quit smoking. I have recommended yearly flu vaccine and pneumonia vaccination at least every 5 years; moderate intensity exercise for up to 150 minutes weekly; and  sleep for at least 7 hours a day.  - 20 minutes of time was spent on the care of this patient , 50% of which was applied for counseling on diabetes complications and their preventions.  - I advised patient to maintain close follow up with Vicki Lair., MD for primary care needs.  Patient is asked to bring meter and  blood glucose logs during her next visit.   Follow up plan: -Return in about 3 months (around 10/13/2017) for meter, and logs.  Glade Lloyd, MD Phone: [phone]  Fax: [phone]   07/13/2017, 9:55 AM

## 2017-07-29 ENCOUNTER — Encounter (HOSPITAL_COMMUNITY)
Admission: RE | Disposition: A | Discharge status: Home / Self Care | Payer: Self-pay | Source: Home / Self Care | Attending: Ophthalmology

## 2017-07-29 ENCOUNTER — Ambulatory Visit (HOSPITAL_COMMUNITY): Payer: Medicare Other | Admitting: Certified Registered"

## 2017-07-29 ENCOUNTER — Encounter (HOSPITAL_COMMUNITY): Payer: Self-pay | Admitting: Orthopedic Surgery

## 2017-07-29 ENCOUNTER — Ambulatory Visit: Payer: Self-pay | Admitting: Ophthalmology

## 2017-07-29 ENCOUNTER — Ambulatory Visit (HOSPITAL_COMMUNITY)
Admission: RE | Admit: 2017-07-29 | Discharge: 2017-07-29 | Disposition: A | Discharge status: Home / Self Care | Payer: Medicare Other | Attending: Ophthalmology | Admitting: Ophthalmology

## 2017-07-29 DIAGNOSIS — H35363 Drusen (degenerative) of macula, bilateral: Secondary | ICD-10-CM | POA: Diagnosis not present

## 2017-07-29 DIAGNOSIS — Z794 Long term (current) use of insulin: Secondary | ICD-10-CM | POA: Insufficient documentation

## 2017-07-29 DIAGNOSIS — Z79899 Other long term (current) drug therapy: Secondary | ICD-10-CM | POA: Insufficient documentation

## 2017-07-29 DIAGNOSIS — H43392 Other vitreous opacities, left eye: Secondary | ICD-10-CM | POA: Diagnosis not present

## 2017-07-29 DIAGNOSIS — H35411 Lattice degeneration of retina, right eye: Secondary | ICD-10-CM | POA: Diagnosis not present

## 2017-07-29 DIAGNOSIS — F17200 Nicotine dependence, unspecified, uncomplicated: Secondary | ICD-10-CM | POA: Insufficient documentation

## 2017-07-29 DIAGNOSIS — E119 Type 2 diabetes mellitus without complications: Secondary | ICD-10-CM | POA: Diagnosis not present

## 2017-07-29 DIAGNOSIS — H53452 Other localized visual field defect, left eye: Secondary | ICD-10-CM | POA: Diagnosis not present

## 2017-07-29 DIAGNOSIS — H33012 Retinal detachment with single break, left eye: Secondary | ICD-10-CM | POA: Diagnosis not present

## 2017-07-29 DIAGNOSIS — Z7982 Long term (current) use of aspirin: Secondary | ICD-10-CM | POA: Insufficient documentation

## 2017-07-29 DIAGNOSIS — H3322 Serous retinal detachment, left eye: Secondary | ICD-10-CM | POA: Insufficient documentation

## 2017-07-29 DIAGNOSIS — I10 Essential (primary) hypertension: Secondary | ICD-10-CM | POA: Insufficient documentation

## 2017-07-29 DIAGNOSIS — H401123 Primary open-angle glaucoma, left eye, severe stage: Secondary | ICD-10-CM | POA: Diagnosis not present

## 2017-07-29 DIAGNOSIS — H353133 Nonexudative age-related macular degeneration, bilateral, advanced atrophic without subfoveal involvement: Secondary | ICD-10-CM | POA: Diagnosis not present

## 2017-07-29 DIAGNOSIS — H3122 Choroidal dystrophy (central areolar) (generalized) (peripapillary): Secondary | ICD-10-CM | POA: Diagnosis not present

## 2017-07-29 DIAGNOSIS — H401111 Primary open-angle glaucoma, right eye, mild stage: Secondary | ICD-10-CM | POA: Diagnosis not present

## 2017-07-29 DIAGNOSIS — H43813 Vitreous degeneration, bilateral: Secondary | ICD-10-CM | POA: Diagnosis not present

## 2017-07-29 DIAGNOSIS — E782 Mixed hyperlipidemia: Secondary | ICD-10-CM | POA: Diagnosis not present

## 2017-07-29 DIAGNOSIS — H3321 Serous retinal detachment, right eye: Secondary | ICD-10-CM | POA: Diagnosis not present

## 2017-07-29 HISTORY — PX: VITRECTOMY 25 GAUGE WITH SCLERAL BUCKLE: SHX6183

## 2017-07-29 LAB — CBC
HCT: 43.1 % (ref 36.0–46.0)
Hemoglobin: 14.5 g/dL (ref 12.0–15.0)
MCH: 30.2 pg (ref 26.0–34.0)
MCHC: 33.6 g/dL (ref 30.0–36.0)
MCV: 89.8 fL (ref 78.0–100.0)
Platelets: 284 10*3/uL (ref 150–400)
RBC: 4.8 MIL/uL (ref 3.87–5.11)
RDW: 15.6 % — AB (ref 11.5–15.5)
WBC: 14.5 10*3/uL — AB (ref 4.0–10.5)

## 2017-07-29 LAB — GLUCOSE, CAPILLARY
GLUCOSE-CAPILLARY: 67 mg/dL (ref 65–99)
GLUCOSE-CAPILLARY: 93 mg/dL (ref 65–99)
Glucose-Capillary: 77 mg/dL (ref 65–99)

## 2017-07-29 SURGERY — VITRECTOMY, USING 25-GAUGE INSTRUMENTS, WITH SCLERAL BUCKLING
Anesthesia: Monitor Anesthesia Care | Site: Eye | Laterality: Left

## 2017-07-29 MED ORDER — OFLOXACIN 0.3 % OP SOLN
OPHTHALMIC | Status: AC
  Filled 2017-07-29: qty 5

## 2017-07-29 MED ORDER — CYCLOPENTOLATE HCL 1 % OP SOLN
1.0000 [drp] | OPHTHALMIC | Status: AC | PRN
  Administered 2017-07-29 (×3): 1 [drp] via OPHTHALMIC

## 2017-07-29 MED ORDER — TOBRAMYCIN-DEXAMETHASONE 0.3-0.1 % OP OINT
TOPICAL_OINTMENT | OPHTHALMIC | Status: AC
  Filled 2017-07-29: qty 3.5

## 2017-07-29 MED ORDER — BUPIVACAINE HCL (PF) 0.75 % IJ SOLN
INTRAMUSCULAR | Status: AC
  Filled 2017-07-29: qty 10

## 2017-07-29 MED ORDER — CYCLOPENTOLATE HCL 1 % OP SOLN
OPHTHALMIC | Status: AC
  Filled 2017-07-29: qty 2

## 2017-07-29 MED ORDER — NA CHONDROIT SULF-NA HYALURON 40-30 MG/ML IO SOLN
INTRAOCULAR | Status: AC
  Filled 2017-07-29: qty 0.5

## 2017-07-29 MED ORDER — SODIUM CHLORIDE 0.9 % IV SOLN
INTRAVENOUS | Status: DC
  Administered 2017-07-29 (×2): via INTRAVENOUS

## 2017-07-29 MED ORDER — PHENYLEPHRINE HCL 2.5 % OP SOLN
1.0000 [drp] | OPHTHALMIC | Status: AC | PRN
  Administered 2017-07-29 (×3): 1 [drp] via OPHTHALMIC

## 2017-07-29 MED ORDER — DEXAMETHASONE SODIUM PHOSPHATE 10 MG/ML IJ SOLN
INTRAMUSCULAR | Status: AC
  Filled 2017-07-29: qty 1

## 2017-07-29 MED ORDER — SODIUM CHLORIDE 0.9 % IJ SOLN
INTRAMUSCULAR | Status: AC
  Filled 2017-07-29: qty 10

## 2017-07-29 MED ORDER — BSS PLUS IO SOLN
INTRAOCULAR | Status: AC
  Filled 2017-07-29: qty 500

## 2017-07-29 MED ORDER — PHENYLEPHRINE HCL 10 MG/ML IJ SOLN
INTRAMUSCULAR | Status: DC | PRN
  Administered 2017-07-29: 80 ug via INTRAVENOUS

## 2017-07-29 MED ORDER — FENTANYL CITRATE (PF) 100 MCG/2ML IJ SOLN
INTRAMUSCULAR | Status: DC | PRN
  Administered 2017-07-29 (×3): 50 ug via INTRAVENOUS

## 2017-07-29 MED ORDER — BACITRACIN-POLYMYXIN B 500-10000 UNIT/GM OP OINT
TOPICAL_OINTMENT | OPHTHALMIC | Status: DC | PRN
  Administered 2017-07-29: 1 via OPHTHALMIC

## 2017-07-29 MED ORDER — EPINEPHRINE PF 1 MG/ML IJ SOLN
INTRAMUSCULAR | Status: DC | PRN
  Administered 2017-07-29: .3 mL

## 2017-07-29 MED ORDER — ATROPINE SULFATE 1 % OP OINT
TOPICAL_OINTMENT | OPHTHALMIC | Status: AC
  Filled 2017-07-29: qty 3.5

## 2017-07-29 MED ORDER — ONDANSETRON HCL 4 MG/2ML IJ SOLN
INTRAMUSCULAR | Status: AC
  Filled 2017-07-29: qty 2

## 2017-07-29 MED ORDER — PROPOFOL 10 MG/ML IV BOLUS
INTRAVENOUS | Status: AC
  Filled 2017-07-29: qty 20

## 2017-07-29 MED ORDER — DIPHENHYDRAMINE HCL 50 MG/ML IJ SOLN
INTRAMUSCULAR | Status: AC
  Filled 2017-07-29: qty 1

## 2017-07-29 MED ORDER — POLYMYXIN B SULFATE 500000 UNITS IJ SOLR
INTRAMUSCULAR | Status: AC
  Filled 2017-07-29: qty 500000

## 2017-07-29 MED ORDER — BSS PLUS IO SOLN
INTRAOCULAR | Status: DC | PRN
  Administered 2017-07-29: 1 via INTRAOCULAR

## 2017-07-29 MED ORDER — OXYCODONE HCL 5 MG/5ML PO SOLN: 5.0000 mg | Freq: Once | ORAL | Status: DC | PRN

## 2017-07-29 MED ORDER — HYDROMORPHONE HCL 1 MG/ML IJ SOLN: 0.2500 mg | INTRAMUSCULAR | Status: DC | PRN

## 2017-07-29 MED ORDER — PROMETHAZINE HCL 25 MG/ML IJ SOLN: 6.2500 mg | INTRAMUSCULAR | Status: DC | PRN

## 2017-07-29 MED ORDER — GENTAMICIN SULFATE 40 MG/ML IJ SOLN
INTRAMUSCULAR | Status: AC
  Filled 2017-07-29: qty 2

## 2017-07-29 MED ORDER — SODIUM HYALURONATE 10 MG/ML IO SOLN
INTRAOCULAR | Status: DC | PRN
  Administered 2017-07-29: 0.85 mL via INTRAOCULAR

## 2017-07-29 MED ORDER — FENTANYL CITRATE (PF) 250 MCG/5ML IJ SOLN
INTRAMUSCULAR | Status: AC
  Filled 2017-07-29: qty 5

## 2017-07-29 MED ORDER — LIDOCAINE HCL (PF) 2 % IJ SOLN
INTRAMUSCULAR | Status: AC
  Filled 2017-07-29: qty 20

## 2017-07-29 MED ORDER — OFLOXACIN 0.3 % OP SOLN
1.0000 [drp] | OPHTHALMIC | Status: AC | PRN
  Administered 2017-07-29: 1 [drp] via OPHTHALMIC

## 2017-07-29 MED ORDER — EPINEPHRINE PF 1 MG/ML IJ SOLN
INTRAMUSCULAR | Status: AC
  Filled 2017-07-29: qty 1

## 2017-07-29 MED ORDER — PHENYLEPHRINE HCL 2.5 % OP SOLN
OPHTHALMIC | Status: AC
  Filled 2017-07-29: qty 2

## 2017-07-29 MED ORDER — TOBRAMYCIN-DEXAMETHASONE 0.3-0.1 % OP SUSP
OPHTHALMIC | Status: DC | PRN
  Administered 2017-07-29: 2 [drp] via OPHTHALMIC

## 2017-07-29 MED ORDER — DIPHENHYDRAMINE HCL 50 MG/ML IJ SOLN
INTRAMUSCULAR | Status: DC | PRN
  Administered 2017-07-29: 25 mg via INTRAVENOUS

## 2017-07-29 MED ORDER — DEXAMETHASONE SODIUM PHOSPHATE 10 MG/ML IJ SOLN
INTRAMUSCULAR | Status: DC | PRN
  Administered 2017-07-29: 10 mg

## 2017-07-29 MED ORDER — SODIUM HYALURONATE 10 MG/ML IO SOLN
INTRAOCULAR | Status: AC
  Filled 2017-07-29: qty 0.85

## 2017-07-29 MED ORDER — ACETAZOLAMIDE SODIUM 500 MG IJ SOLR
INTRAMUSCULAR | Status: AC
  Filled 2017-07-29: qty 500

## 2017-07-29 MED ORDER — 0.9 % SODIUM CHLORIDE (POUR BTL) OPTIME
TOPICAL | Status: DC | PRN
  Administered 2017-07-29: 1000 mL

## 2017-07-29 MED ORDER — PROPOFOL 10 MG/ML IV BOLUS
INTRAVENOUS | Status: DC | PRN
  Administered 2017-07-29: 50 mg via INTRAVENOUS

## 2017-07-29 MED ORDER — ONDANSETRON HCL 4 MG/2ML IJ SOLN
INTRAMUSCULAR | Status: DC | PRN
  Administered 2017-07-29: 4 mg via INTRAVENOUS

## 2017-07-29 MED ORDER — LIDOCAINE HCL 2 % IJ SOLN
INTRAMUSCULAR | Status: DC | PRN
  Administered 2017-07-29: 15 mL via RETROBULBAR

## 2017-07-29 MED ORDER — OXYCODONE HCL 5 MG PO TABS: 5.0000 mg | ORAL_TABLET | Freq: Once | ORAL | Status: DC | PRN

## 2017-07-29 MED ORDER — POLYVINYL ALCOHOL 1.4 % OP SOLN
1.0000 [drp] | OPHTHALMIC | Status: DC | PRN
  Filled 2017-07-29: qty 15

## 2017-07-29 MED ORDER — ATROPINE SULFATE 1 % OP SOLN
OPHTHALMIC | Status: AC
  Filled 2017-07-29: qty 5

## 2017-07-29 SURGICAL SUPPLY — 71 items
APPLICATOR COTTON TIP 6IN STRL (MISCELLANEOUS) ×9 IMPLANT
APPLICATOR DR MATTHEWS STRL (MISCELLANEOUS) ×12 IMPLANT
BANDAGE EYE OVAL (MISCELLANEOUS) ×6 IMPLANT
BLADE EYE CATARACT 19 1.4 BEAV (BLADE) IMPLANT
BLADE MVR KNIFE 19G (BLADE) IMPLANT
CANNULA ANT CHAM MAIN (OPHTHALMIC RELATED) IMPLANT
CANNULA DUAL BORE 23G (CANNULA) IMPLANT
CANNULA VLV SOFT TIP 25GA (OPHTHALMIC) ×3 IMPLANT
CAUTERY EYE LOW TEMP 1300F FIN (OPHTHALMIC RELATED) IMPLANT
CORDS BIPOLAR (ELECTRODE) IMPLANT
COTTONBALL LRG STERILE PKG (GAUZE/BANDAGES/DRESSINGS) ×9 IMPLANT
COVER SURGICAL LIGHT HANDLE (MISCELLANEOUS) ×3 IMPLANT
DRAPE INCISE 51X51 W/FILM STRL (DRAPES) ×3 IMPLANT
DRAPE RETRACTOR (MISCELLANEOUS) ×3 IMPLANT
ERASER HMR WETFIELD 23G BP (MISCELLANEOUS) IMPLANT
FILTER BLUE MILLIPORE (MISCELLANEOUS) IMPLANT
FILTER STRAW FLUID ASPIR (MISCELLANEOUS) IMPLANT
FORCEPS GRIESHABER ILM 25G A (INSTRUMENTS) IMPLANT
GAS AUTO FILL CONSTEL (OPHTHALMIC)
GAS AUTO FILL CONSTELLATION (OPHTHALMIC) IMPLANT
GLOVE BIO SURGEON STRL SZ7.5 (GLOVE) ×3 IMPLANT
GLOVE BIOGEL PI IND STRL 7.5 (GLOVE) ×1 IMPLANT
GLOVE BIOGEL PI INDICATOR 7.5 (GLOVE) ×2
GLOVE ECLIPSE 7.5 STRL STRAW (GLOVE) ×3 IMPLANT
GOWN STRL REUS W/ TWL LRG LVL3 (GOWN DISPOSABLE) ×2 IMPLANT
GOWN STRL REUS W/TWL LRG LVL3 (GOWN DISPOSABLE) ×4
HANDLE PNEUMATIC FOR CONSTEL (OPHTHALMIC) IMPLANT
KIT BASIN OR (CUSTOM PROCEDURE TRAY) ×3 IMPLANT
KIT PERFLUORON PROCEDURE 5ML (MISCELLANEOUS) IMPLANT
KIT ROOM TURNOVER OR (KITS) ×3 IMPLANT
KNIFE CRESCENT 1.75 EDGEAHEAD (BLADE) IMPLANT
KNIFE GRIESHABER SHARP 2.5MM (MISCELLANEOUS) ×3 IMPLANT
LENS BIOM SUPER VIEW SET DISP (OPHTHALMIC RELATED) ×3 IMPLANT
MICROPICK 25G (MISCELLANEOUS)
NEEDLE 18GX1X1/2 (RX/OR ONLY) (NEEDLE) ×3 IMPLANT
NEEDLE 25GX 5/8IN NON SAFETY (NEEDLE) ×3 IMPLANT
NEEDLE FILTER BLUNT 18X 1/2SAF (NEEDLE) ×2
NEEDLE FILTER BLUNT 18X1 1/2 (NEEDLE) ×1 IMPLANT
NEEDLE HYPO 25GX1X1/2 BEV (NEEDLE) ×3 IMPLANT
NEEDLE HYPO 30X.5 LL (NEEDLE) ×3 IMPLANT
NEEDLE RETROBULBAR 25GX1.5 (NEEDLE) ×3 IMPLANT
NS IRRIG 1000ML POUR BTL (IV SOLUTION) ×3 IMPLANT
PACK VITRECTOMY CUSTOM (CUSTOM PROCEDURE TRAY) ×3 IMPLANT
PAD ARMBOARD 7.5X6 YLW CONV (MISCELLANEOUS) ×6 IMPLANT
PAK PIK VITRECTOMY CVS 25GA (OPHTHALMIC) ×3 IMPLANT
PENCIL BIPOLAR 25GA STR DISP (OPHTHALMIC RELATED) ×3 IMPLANT
PICK MICROPICK 25G (MISCELLANEOUS) IMPLANT
PROBE ENDO DIATHERMY 25G (MISCELLANEOUS) ×3 IMPLANT
PROBE LASER ILLUM FLEX CVD 25G (OPHTHALMIC) IMPLANT
REPL STRA BRUSH NEEDLE (NEEDLE) IMPLANT
RESERVOIR BACK FLUSH (MISCELLANEOUS) IMPLANT
ROLLS DENTAL (MISCELLANEOUS) ×6 IMPLANT
SCRAPER DIAMOND 25GA (OPHTHALMIC RELATED) IMPLANT
SHEET MEDIUM DRAPE 40X70 STRL (DRAPES) ×3 IMPLANT
SPONGE SURGIFOAM ABS GEL 12-7 (HEMOSTASIS) IMPLANT
STOPCOCK 4 WAY LG BORE MALE ST (IV SETS) IMPLANT
SUT CHROMIC 7 0 TG140 8 (SUTURE) IMPLANT
SUT ETHILON 9 0 TG140 8 (SUTURE) IMPLANT
SUT MERSILENE 4 0 RV 2 (SUTURE) IMPLANT
SUT SILK 2 0 (SUTURE)
SUT SILK 2-0 18XBRD TIE 12 (SUTURE) IMPLANT
SUT SILK 4 0 RB 1 (SUTURE) IMPLANT
SYR 10ML LL (SYRINGE) ×3 IMPLANT
SYR 20CC LL (SYRINGE) IMPLANT
SYR 5ML LL (SYRINGE) ×3 IMPLANT
SYR BULB 3OZ (MISCELLANEOUS) ×3 IMPLANT
SYR TB 1ML LUER SLIP (SYRINGE) IMPLANT
TOWEL OR 17X24 6PK STRL BLUE (TOWEL DISPOSABLE) ×6 IMPLANT
TUBING HIGH PRESS EXTEN 6IN (TUBING) IMPLANT
WATER STERILE IRR 1000ML POUR (IV SOLUTION) ×3 IMPLANT
WIPE INSTRUMENT VISIWIPE 73X73 (MISCELLANEOUS) IMPLANT

## 2017-07-29 NOTE — Transfer of Care (Signed)
Immediate Anesthesia Transfer of Care Note  Patient: Vicki Moon  Procedure(s) Performed: Procedure(s): RETINAL DETACHMENT REPAIR LEFT EYE WITH PARSPLANA VITRECTOMY, AIR FLUID EXCHANGE, ENDO LASTER, DRAINAGE OF SUBRETINAL FLUID,ENDOLASER PPV /25 GAUGE (Left)  Patient Location: PACU  Anesthesia Type:MAC  Level of Consciousness: awake, alert  and oriented  Airway & Oxygen Therapy: Patient Spontanous Breathing  Post-op Assessment: Report given to RN and Post -op Vital signs reviewed and stable  Post vital signs: Reviewed and stable  Last Vitals:  Vitals:   07/29/17 1949  BP: (!) 145/49  Pulse: 65  Resp: 18  Temp: 36.8 C    Last Pain:  Vitals:   07/29/17 1949  TempSrc: Oral         Complications: No apparent anesthesia complications

## 2017-07-29 NOTE — Op Note (Signed)
Vicki Moon 07/29/2017 Diagnosis: Retinal detachment left eye   Procedure: [place] Vitrectomy, Endolaser, Fluid Gas Exchange, drainage of subretinal fluid, and SF6 gas Operative Eye:  left eye  Surgeon: Royston Cowper Estimated Blood Loss: minimal Specimens for Pathology:  None Complications: none   The  patient was prepped and draped in the usual fashion for ocular surgery on the  left eye .  A lid speculum was placed.  Infusion line and trocar was placed at the 4 o'clock position approximately 3.5 mm from the surgical limbus.   The infusion line was allowed to run and then clamped when placed at the cannula opening. The line was inserted and secured to the drape with an adhesive strip.   Active trocars/cannula were placed at the 10 and 2 o'clock positions approximately 3.5 mm from the surgical limbus. The cannula was visualized in the vitreous cavity.  The light pipe and vitreous cutter were inserted into the vitreous cavity and a core vitrectomy was performed.  Care taken to remove the vitreous up to the vitreous base for 360 degrees.   The vitreous was carefully shaved over the retinal detachment to the ora with the aid of scleral depression.  Endocautery was used to create a retinotomy inferonasal to the disc.  The soft tipped extrusion was used to drain subretinal fluid and flatten the retina as air was introduced into the eye.  The retina was flattened.  Viscoat was used to coat the interior surface of the lens due to the formation of condensation.    3 rows of endolaser were applied 360 degrees to the periphery and surrounding the breaks at 9 and 11:30.  18% SF6 gas was placed in the eye.  The superior cannulas were sequentially removed with concommitant tamponade using a cotton tipped applicator and noted to be air tight.  The infusion line and trocar were removed and the sclerotomy was noted to be air tight with normal intraocular pressure by digital palpapation.   Subconjunctival injections of  Dexamethasone 4mg /37ml was placed in the infero-medial quadrant.   The speculum and drapes were removed and the eye was patched with Polymixin/Bacitracin ophthalmic ointment. An eye shield was placed and the patient was transferred alert and conversant with stable vital signs to the post operative recovery area.  The patient tolerated the procedure well and no complications were noted.  Royston Cowper MD

## 2017-07-29 NOTE — Anesthesia Procedure Notes (Signed)
Procedure Name: MAC Date/Time: 07/29/2017 8:20 PM Performed by: Babs Bertin Pre-anesthesia Checklist: Patient identified, Emergency Drugs available, Suction available, Patient being monitored and Timeout performed Patient Re-evaluated:Patient Re-evaluated prior to induction Oxygen Delivery Method: Nasal cannula

## 2017-07-29 NOTE — Anesthesia Preprocedure Evaluation (Signed)
Anesthesia Evaluation  Patient identified by MRN, date of birth, ID band Patient awake    Reviewed: Allergy & Precautions, H&P , NPO status , Patient's Chart, lab work & pertinent test results  History of Anesthesia Complications (+) PONV and history of anesthetic complications  Airway Mallampati: II   Neck ROM: full    Dental   Pulmonary Current Smoker,           Cardiovascular hypertension, Pt. on medications      Neuro/Psych PSYCHIATRIC DISORDERS    GI/Hepatic   Endo/Other  diabetes, Type 2, Insulin Dependentobese  Renal/GU      Musculoskeletal   Abdominal (+) + obese,   Peds  Hematology   Anesthesia Other Findings   Reproductive/Obstetrics                             Anesthesia Physical  Anesthesia Plan  ASA: III and emergent  Anesthesia Plan: General   Post-op Pain Management:    Induction: Intravenous  PONV Risk Score and Plan: 3 and Ondansetron, Dexamethasone, Midazolam and Propofol infusion  Airway Management Planned: Oral ETT  Additional Equipment:   Intra-op Plan:   Post-operative Plan: Extubation in OR  Informed Consent: I have reviewed the patients History and Physical, chart, labs and discussed the procedure including the risks, benefits and alternatives for the proposed anesthesia with the patient or authorized representative who has indicated his/her understanding and acceptance.     Plan Discussed with: CRNA and Surgeon  Anesthesia Plan Comments:         Anesthesia Quick Evaluation

## 2017-07-29 NOTE — H&P (Signed)
  Date of examination:  07/29/17  Indication for surgery: Retinal detachment and severe glaucoma left eye  Pertinent past medical history:  Past Medical History:  Diagnosis Date  . Diabetes mellitus without complication (HCC)    Type 2 IDDM x 5 years  . Glaucoma   . Herniated lumbar intervertebral disc 06/2014  . PONV (postoperative nausea and vomiting)     Pertinent ocular history:  Glaucoma and recent onset visual field loss left eye  Pertinent family history:  Family History  Problem Relation Age of Onset  . Hypertension Mother   . Stroke Mother   . Cancer Father        brain tumor  . Diabetes Father     General:  Healthy appearing patient in no distress.   Eyes:    Acuity OD 20/25 OS 20/50   External: Within normal limits     Anterior segment: Within normal limits     Motility:     Fundus: cecocentral chorioretinal scars OU.  Retinal detachment left eye with superior break and mild PVR      Impression: Retinal detachment with severe glaucoma left eye  Plan: Retinal detachment repair and possible endocyclophotocoagulation left eye  Royston Cowper

## 2017-07-29 NOTE — Brief Op Note (Signed)
07/29/2017  10:43 PM  PATIENT:  Regan Rakers  66 y.o. female  PRE-OPERATIVE DIAGNOSIS:  RETINAL DETACHMENT  /LEFT EYE  POST-OPERATIVE DIAGNOSIS:  RETINAL DETACHMENT LEFT EYE  PROCEDURE:  Procedure(s): RETINAL DETACHMENT REPAIR LEFT EYE WITH PARSPLANA VITRECTOMY, AIR FLUID EXCHANGE, ENDOLASER, DRAINAGE OF SUBRETINAL FLUID, SF6 gas (Left)  SURGEON:  Surgeon(s) and Role:    * Jalene Mullet, MD - Primary  PHYSICIAN ASSISTANT:   ASSISTANTS: none   ANESTHESIA:   local and MAC  EBL:  Total I/O In: 800 [I.V.:800] Out: 1 [Blood:1]  BLOOD ADMINISTERED:none  DRAINS: none   LOCAL MEDICATIONS USED:  MARCAINE    and LIDOCAINE   SPECIMEN:  No Specimen  DISPOSITION OF SPECIMEN:  N/A  COUNTS:  YES  TOURNIQUET:  * No tourniquets in log *  DICTATION: .Note written in EPIC  PLAN OF CARE: Discharge to home after PACU  PATIENT DISPOSITION:  PACU - hemodynamically stable.   Delay start of Pharmacological VTE agent (>24hrs) due to surgical blood loss or risk of bleeding: not applicable

## 2017-07-29 NOTE — Anesthesia Preprocedure Evaluation (Addendum)
Anesthesia Evaluation  Patient identified by MRN, date of birth, ID band Patient awake    Reviewed: Allergy & Precautions, NPO status , Patient's Chart, lab work & pertinent test results  History of Anesthesia Complications (+) PONV and history of anesthetic complications  Airway Mallampati: I  TM Distance: >3 FB Neck ROM: Full    Dental no notable dental hx. (+) Teeth Intact, Dental Advisory Given   Pulmonary Current Smoker,    Pulmonary exam normal breath sounds clear to auscultation       Cardiovascular hypertension, Pt. on medications Normal cardiovascular exam Rhythm:Regular Rate:Normal     Neuro/Psych TIA   GI/Hepatic   Endo/Other  diabetes, Insulin Dependent  Renal/GU      Musculoskeletal   Abdominal   Peds  Hematology   Anesthesia Other Findings   Reproductive/Obstetrics                            Anesthesia Physical Anesthesia Plan  ASA: III  Anesthesia Plan: MAC   Post-op Pain Management:    Induction:   PONV Risk Score and Plan: 2 and Ondansetron, Dexamethasone, Propofol infusion and Midazolam  Airway Management Planned: Nasal Cannula  Additional Equipment:   Intra-op Plan:   Post-operative Plan:   Informed Consent: I have reviewed the patients History and Physical, chart, labs and discussed the procedure including the risks, benefits and alternatives for the proposed anesthesia with the patient or authorized representative who has indicated his/her understanding and acceptance.   Dental advisory given  Plan Discussed with: Surgeon and CRNA  Anesthesia Plan Comments:        Anesthesia Quick Evaluation

## 2017-07-29 NOTE — Discharge Instructions (Signed)
DO NOT SLEEP ON BACK, THE EYE PRESSURE CAN GO UP AND CAUSE VISION LOSS   SLEEP ON SIDE WITH NOSE TO PILLOW  DURING DAY KEEP FACE DOWN.  15 MINUTES EVERY 2 HOURS IT IS OK TO LOOK STRAIGHT AHEAD (USE BATHROOM, EAT, WALK, ETC.)  

## 2017-07-30 ENCOUNTER — Encounter (HOSPITAL_COMMUNITY): Payer: Self-pay | Admitting: Ophthalmology

## 2017-07-30 NOTE — Anesthesia Postprocedure Evaluation (Signed)
Anesthesia Post Note  Patient: Vicki Moon  Procedure(s) Performed: Procedure(s) (LRB): RETINAL DETACHMENT REPAIR LEFT EYE WITH PARSPLANA VITRECTOMY, AIR FLUID EXCHANGE, ENDO LASTER, DRAINAGE OF SUBRETINAL FLUID,ENDOLASER PPV /25 GAUGE (Left)     Patient location during evaluation: PACU Anesthesia Type: MAC Level of consciousness: awake and alert Pain management: pain level controlled Vital Signs Assessment: post-procedure vital signs reviewed and stable Respiratory status: spontaneous breathing, nonlabored ventilation, respiratory function stable and patient connected to nasal cannula oxygen Cardiovascular status: stable and blood pressure returned to baseline Anesthetic complications: no    Last Vitals:  Vitals:   07/29/17 2300 07/29/17 2315  BP: 129/66 (!) 104/49  Pulse: 65 65  Resp: 13 15  Temp:  (!) 36.4 C    Last Pain:  Vitals:   07/29/17 1949  TempSrc: Oral                 Ryan P Ellender

## 2017-08-21 DIAGNOSIS — J209 Acute bronchitis, unspecified: Secondary | ICD-10-CM | POA: Diagnosis not present

## 2017-09-15 DIAGNOSIS — H401133 Primary open-angle glaucoma, bilateral, severe stage: Secondary | ICD-10-CM | POA: Diagnosis not present

## 2017-09-16 DIAGNOSIS — I10 Essential (primary) hypertension: Secondary | ICD-10-CM | POA: Diagnosis not present

## 2017-09-16 DIAGNOSIS — R05 Cough: Secondary | ICD-10-CM | POA: Diagnosis not present

## 2017-09-16 DIAGNOSIS — F17200 Nicotine dependence, unspecified, uncomplicated: Secondary | ICD-10-CM | POA: Diagnosis not present

## 2017-09-28 ENCOUNTER — Other Ambulatory Visit: Payer: Self-pay | Admitting: "Endocrinology

## 2017-10-07 DIAGNOSIS — E118 Type 2 diabetes mellitus with unspecified complications: Secondary | ICD-10-CM | POA: Diagnosis not present

## 2017-10-07 DIAGNOSIS — E782 Mixed hyperlipidemia: Secondary | ICD-10-CM | POA: Diagnosis not present

## 2017-10-07 DIAGNOSIS — Z794 Long term (current) use of insulin: Secondary | ICD-10-CM | POA: Diagnosis not present

## 2017-10-07 DIAGNOSIS — E1165 Type 2 diabetes mellitus with hyperglycemia: Secondary | ICD-10-CM | POA: Diagnosis not present

## 2017-10-08 LAB — LIPID PANEL
CHOL/HDL RATIO: 3.2 (calc) (ref <5.0)
Cholesterol: 147 mg/dL (ref <200)
HDL: 46 mg/dL — ABNORMAL LOW (ref >50)
LDL CHOLESTEROL (CALC): 79 mg/dL
NON-HDL CHOLESTEROL (CALC): 101 mg/dL (ref <130)
Triglycerides: 122 mg/dL (ref <150)

## 2017-10-08 LAB — HEMOGLOBIN A1C
EAG (MMOL/L): 10.9 (calc)
HEMOGLOBIN A1C: 8.5 %{Hb} — AB (ref <5.7)
MEAN PLASMA GLUCOSE: 197 (calc)

## 2017-10-08 LAB — RENAL FUNCTION PANEL
Albumin: 3.7 g/dL (ref 3.6–5.1)
BUN: 11 mg/dL (ref 7–25)
CHLORIDE: 106 mmol/L (ref 98–110)
CO2: 31 mmol/L (ref 20–32)
CREATININE: 0.54 mg/dL (ref 0.50–0.99)
Calcium: 8.9 mg/dL (ref 8.6–10.4)
GLUCOSE: 88 mg/dL (ref 65–99)
POTASSIUM: 4.1 mmol/L (ref 3.5–5.3)
Phosphorus: 3.1 mg/dL (ref 2.1–4.3)
SODIUM: 142 mmol/L (ref 135–146)

## 2017-10-09 DIAGNOSIS — Z23 Encounter for immunization: Secondary | ICD-10-CM | POA: Diagnosis not present

## 2017-10-14 ENCOUNTER — Encounter: Payer: Self-pay | Admitting: "Endocrinology

## 2017-10-14 ENCOUNTER — Ambulatory Visit (INDEPENDENT_AMBULATORY_CARE_PROVIDER_SITE_OTHER): Payer: Medicare Other | Admitting: "Endocrinology

## 2017-10-14 VITALS — BP 146/75 | HR 75 | Ht 62.0 in | Wt 204.0 lb

## 2017-10-14 DIAGNOSIS — Z794 Long term (current) use of insulin: Secondary | ICD-10-CM | POA: Diagnosis not present

## 2017-10-14 DIAGNOSIS — E782 Mixed hyperlipidemia: Secondary | ICD-10-CM | POA: Diagnosis not present

## 2017-10-14 DIAGNOSIS — I10 Essential (primary) hypertension: Secondary | ICD-10-CM | POA: Diagnosis not present

## 2017-10-14 DIAGNOSIS — E118 Type 2 diabetes mellitus with unspecified complications: Secondary | ICD-10-CM

## 2017-10-14 DIAGNOSIS — L299 Pruritus, unspecified: Secondary | ICD-10-CM | POA: Diagnosis not present

## 2017-10-14 DIAGNOSIS — E1165 Type 2 diabetes mellitus with hyperglycemia: Secondary | ICD-10-CM | POA: Diagnosis not present

## 2017-10-14 DIAGNOSIS — IMO0002 Reserved for concepts with insufficient information to code with codable children: Secondary | ICD-10-CM

## 2017-10-14 MED ORDER — INSULIN LISPRO 100 UNIT/ML (KWIKPEN): 20.0000 [IU] | PEN_INJECTOR | Freq: Three times a day (TID) | SUBCUTANEOUS | 2 refills | Status: DC

## 2017-10-14 MED ORDER — FREESTYLE LIBRE READER DEVI: 1.0000 | Freq: Once | 0 refills | Status: AC

## 2017-10-14 MED ORDER — FREESTYLE LIBRE SENSOR SYSTEM MISC: 2 refills | Status: DC

## 2017-10-14 NOTE — Patient Instructions (Signed)

## 2017-10-14 NOTE — Progress Notes (Signed)
Subjective:    Patient ID: Vicki Moon, female    DOB: March 13, 1951, PCP Deloria Lair., MD   Past Medical History:  Diagnosis Date  . Diabetes mellitus without complication (HCC)    Type 2 IDDM x 5 years  . Glaucoma   . Herniated lumbar intervertebral disc 06/2014  . PONV (postoperative nausea and vomiting)    Past Surgical History:  Procedure Laterality Date  . CHOLECYSTECTOMY    . COLONOSCOPY N/A 06/28/2015   Procedure: COLONOSCOPY;  Surgeon: Rogene Houston, MD;  Location: [facility];  Service: Endoscopy;  Laterality: N/A;  155  . EYE SURGERY    . LUMBAR LAMINECTOMY Right 07/12/2014   Procedure: LUMBAR FIVE TO SACRAL ONE MICRODISCECTOMY;  Surgeon: Marybelle Killings, MD;  Location: [facility];  Service: Orthopedics;  Laterality: Right;  . TUBAL LIGATION    . VITRECTOMY 25 GAUGE WITH SCLERAL BUCKLE Left 07/29/2017   Procedure: RETINAL DETACHMENT REPAIR LEFT EYE WITH PARSPLANA VITRECTOMY, AIR FLUID EXCHANGE, ENDO LASTER, DRAINAGE OF SUBRETINAL FLUID,ENDOLASER PPV /25 GAUGE;  Surgeon: Jalene Mullet, MD;  Location: [facility];  Service: Ophthalmology;  Laterality: Left;   Social History   Social History  . Marital status: Married    Spouse name: N/A  . Number of children: N/A  . Years of education: N/A   Social History Main Topics  . Smoking status: Current Some Day Smoker    Packs/day: 1.00    Years: 10.00    Types: Cigarettes  . Smokeless tobacco: Never Used     Comment:  1/2 pack a day since age 31  . Alcohol use No  . Drug use: No  . Sexual activity: Yes   Other Topics Concern  . None   Social History Narrative  . None   Outpatient Encounter Prescriptions as of 10/14/2017  Medication Sig  . losartan (COZAAR) 25 MG tablet Take 25 mg by mouth daily.  Marland Kitchen aspirin 81 MG tablet Take 81 mg by mouth daily.  . B-D ULTRAFINE III SHORT PEN 31G X 8 MM MISC USE ONE  4 TIMES DAILY  . Continuous Blood Gluc Receiver (FREESTYLE LIBRE READER) DEVI 1 Piece by Does not apply route  once.  . Continuous Blood Gluc Sensor (FREESTYLE LIBRE SENSOR SYSTEM) MISC Use one sensor every 10 days.  . Insulin Glargine (LANTUS SOLOSTAR [place]) Inject 80 Units into the skin at bedtime.  . insulin lispro (HUMALOG KWIKPEN) 100 UNIT/ML KiwkPen Inject 0.2-0.26 mLs (20-26 Units total) into the skin 3 (three) times daily with meals.  Marland Kitchen LANTUS SOLOSTAR 100 UNIT/ML Solostar Pen INJECT 80 UNITS SUBCUTANEOUSLY ONCE DAILY AT 10 OCLOCK IN THE EVENING  . latanoprost (XALATAN) 0.005 % ophthalmic solution Place 1 drop into both eyes at bedtime.  Marland Kitchen lisinopril (PRINIVIL,ZESTRIL) 10 MG tablet TAKE 1 TABLET BY MOUTH ONCE DAILY  . Multiple Vitamins-Minerals (PRESERVISION AREDS PO) Take by mouth 2 (two) times daily.  Glory Rosebush VERIO test strip USE 1 STRIP TO CHECK GLUCOSE 4 TIMES DAILY  . [DISCONTINUED] insulin lispro (HUMALOG KWIKPEN) 100 UNIT/ML KiwkPen Inject 0.18-0.24 mLs (18-24 Units total) into the skin 3 (three) times daily with meals.  . [DISCONTINUED] PARoxetine (PAXIL) 20 MG tablet Take 20 mg by mouth daily.  . [DISCONTINUED] Phentermine-Topiramate (QSYMIA) 7.5-46 MG CP24 Take 1 capsule by mouth daily. (Patient not taking: Reported on 07/29/2017)  . [DISCONTINUED] Phentermine-Topiramate 3.75-23 MG CP24 Take 1 capsule by mouth daily. (Patient not taking: Reported on 07/29/2017)   No facility-administered encounter medications on  file as of 10/14/2017.    ALLERGIES: No Known Allergies VACCINATION STATUS:  There is no immunization history on file for this patient.  Diabetes  She presents for her follow-up diabetic visit. She has type 2 diabetes mellitus. Onset time: She was diagnosed at approximate age of 66 years. Her disease course has been worsening. There are no hypoglycemic associated symptoms. Pertinent negatives for hypoglycemia include no confusion, headaches, pallor or seizures. There are no diabetic associated symptoms. Pertinent negatives for diabetes include no chest pain, no polydipsia, no  polyphagia and no polyuria. There are no hypoglycemic complications. Symptoms are worsening. There are no diabetic complications. Risk factors for coronary artery disease include diabetes mellitus, hypertension, sedentary lifestyle and tobacco exposure. Current diabetic treatment includes insulin injections and oral agent (monotherapy). She is compliant with treatment most of the time. Her weight is stable. She is following a generally unhealthy diet. When asked about meal planning, she reported none. She has had a previous visit with a dietitian. She never participates in exercise. Her home blood glucose trend is decreasing steadily. Her breakfast blood glucose range is generally 140-180 mg/dl. Her lunch blood glucose range is generally 180-200 mg/dl. Her dinner blood glucose range is generally 180-200 mg/dl. Her overall blood glucose range is 180-200 mg/dl. An ACE inhibitor/angiotensin II receptor blocker is being taken.  Hypertension  This is a chronic problem. The current episode started more than 1 year ago. The problem is controlled. Pertinent negatives include no chest pain, headaches, palpitations or shortness of breath. Risk factors for coronary artery disease include smoking/tobacco exposure, diabetes mellitus and sedentary lifestyle. Past treatments include ACE inhibitors. The current treatment provides moderate improvement.     Review of Systems  Constitutional: Negative for chills, fever and unexpected weight change.  HENT: Positive for sinus pressure. Negative for trouble swallowing and voice change.   Eyes: Negative for visual disturbance.  Respiratory: Negative for cough, shortness of breath and wheezing.   Cardiovascular: Negative for chest pain, palpitations and leg swelling.  Gastrointestinal: Negative for diarrhea, nausea and vomiting.  Endocrine: Negative for cold intolerance, heat intolerance, polydipsia, polyphagia and polyuria.  Musculoskeletal: Negative for arthralgias and  myalgias.  Skin: Negative for color change, pallor, rash and wound.  Neurological: Negative for seizures and headaches.  Psychiatric/Behavioral: Negative for confusion and suicidal ideas.    Objective:    BP (!) 146/75   Pulse 75   Ht 5\' 2"  (1.575 m)   Wt 204 lb (92.5 kg)   BMI 37.31 kg/m   Wt Readings from Last 3 Encounters:  10/14/17 204 lb (92.5 kg)  07/29/17 201 lb (91.2 kg)  07/13/17 201 lb (91.2 kg)    Physical Exam  Constitutional: She is oriented to person, place, and time. She appears well-developed.  HENT:  Head: Normocephalic and atraumatic.  Eyes: EOM are normal.  Neck: Normal range of motion. Neck supple. No tracheal deviation present. No thyromegaly present.  Cardiovascular: Normal rate and regular rhythm.   Pulmonary/Chest: Effort normal and breath sounds normal.  Abdominal: Soft. Bowel sounds are normal. There is no tenderness. There is no guarding.  Musculoskeletal: Normal range of motion. She exhibits no edema.  Neurological: She is alert and oriented to person, place, and time. She has normal reflexes. No cranial nerve deficit. Coordination normal.  Skin: Skin is warm and dry. No rash noted. No erythema. No pallor.  Psychiatric: She has a normal mood and affect. Judgment normal.    CMP     Component Value Date/Time  NA 142 10/07/2017 0946   K 4.1 10/07/2017 0946   CL 106 10/07/2017 0946   CO2 31 10/07/2017 0946   GLUCOSE 88 10/07/2017 0946   BUN 11 10/07/2017 0946   CREATININE 0.54 10/07/2017 0946   CALCIUM 8.9 10/07/2017 0946   PROT 6.5 07/07/2017 0832   ALBUMIN 3.7 07/07/2017 0832   AST 11 07/07/2017 0832   ALT 12 07/07/2017 0832   ALKPHOS 105 07/07/2017 0832   BILITOT 0.4 07/07/2017 0832   GFRNONAA >89 09/15/2016 0945   GFRAA >89 09/15/2016 0945    Diabetic Labs (most recent): Lab Results  Component Value Date   HGBA1C 8.5 (H) 10/07/2017   HGBA1C 8.1 (H) 07/07/2017   HGBA1C 8.0 (H) 04/06/2017      Assessment & Plan:   1.  Uncontrolled type 2 diabetes mellitus with complication, with long-term current use of insulin ([facility]) - patient remains at a high risk for more acute and chronic complications of diabetes which include CAD, CVA, CKD, retinopathy, and neuropathy. These are all discussed in detail with the patient.  Patient came with Above target glycemic profile,   her most recent A1c is higher at 8.5% from 8.1% . - Her A1c has progressively improved from 9.8%.    Glucose logs and insulin administration records pertaining to this visit,  to be scanned into patient's records.  Recent labs reviewed.   - I have re-counseled the patient on diet management  by adopting a carbohydrate restricted / protein rich  Diet.  -  Suggestion is made for her to avoid simple carbohydrates  from her diet including Cakes, Sweet Desserts / Pastries, Ice Cream, Soda (diet and regular), Sweet Tea, Candies, Chips, Cookies, Store Bought Juices, Alcohol in Excess of  1-2 drinks a day, Artificial Sweeteners, and "Sugar-free" Products. This will help patient to have stable blood glucose profile and potentially avoid unintended weight gain.   - Patient is advised to stick to a routine mealtimes to eat 3 meals  a day and avoid unnecessary snacks ( to snack only to correct hypoglycemia).   - I have approached patient with the following individualized plan to manage diabetes and patient agrees.  - She admits she has had dietary indiscretion. I will continue  Lantus 80 units daily at bedtime,  increase Humalog to 20 units 3 times a day before meals for  pre-meal BG readings of 90-150mg /dl, plus patient specific correction dose of rapid acting insulin  for unexpected hyperglycemia above 150mg /dl, associated with strict monitoring of glucose 4 times a day-before meals and at bedtime.  - She'll benefit from continuous glucose monitoring. I discussed and initiated a prescription for Freestyle Libre device for her.     - Patient is warned not to take  insulin without proper monitoring per orders. -Adjustment parameters are given for hypo and hyperglycemia in writing. -Patient is encouraged to call clinic for blood glucose levels less than 70 or above 300 mg /dl. - She  did not tolerate metformin, her GI complaints have disappeared after she stopped metformin.   - I advised her to stay off of metformin.  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 -  CT scan of her abdomen is negative for adrenal neoplastic process,  positive for diffuse diverticulosis, small left nephrolithiasis, and  calcified aortic atherosclerosis.  - She may benefit from cortisol lowering therapy- [facility], would initiate discussion regarding her insurance coverage.  - Anti-islet cell antibodies undetectable.   - She is  not suitable candidate for incretin therapy for now - Patient specific target  for A1c; LDL, HDL, Triglycerides, and  Waist Circumference were discussed in detail.  2) BP/HTN: Controlled .  continue current medications including ACEI/ARB. 3) Lipids/HPL: Controlled, LDL 84,  she is not on statins.  4)  Weight/Diet: She has steady weight since last visit after she has had progressive weight gain of 20 pounds over 7 years.  CDE consult in progress, exercise, and carbohydrates information provided. - She could not afford the prescription for Qsymia.  5. Vitamin D deficiency; she is status post vitamin D replacement.  6) Chronic Care/Health Maintenance:  -Patient  is on ACEI/ARB medications and encouraged to continue to follow up with Ophthalmology, Podiatrist at least yearly or according to recommendations, and advised to quit smoking. I have recommended yearly flu vaccine and pneumonia vaccination at least every 5 years; moderate intensity exercise for up to 150 minutes weekly; and  sleep for at least 7 hours a day.  - Time spent with the patient: 25 min, of which >50% was spent in reviewing her sugar  logs , discussing her hypo- and hyper-glycemic episodes, reviewing her current and  previous labs and insulin doses and developing a plan to avoid hypo- and hyper-glycemia.    - I advised patient to maintain close follow up with Deloria Lair., MD for primary care needs.     Follow up plan: -Return in about 3 months (around 01/14/2018) for meter, and logs.  Glade Lloyd, MD Phone: [phone]  Fax: [phone]   -  This note was partially dictated with voice recognition software. Similar sounding words can be transcribed inadequately or may not  be corrected upon review.  10/14/2017, 8:48 AM

## 2017-10-26 DIAGNOSIS — L299 Pruritus, unspecified: Secondary | ICD-10-CM | POA: Diagnosis not present

## 2017-11-09 DIAGNOSIS — H353111 Nonexudative age-related macular degeneration, right eye, early dry stage: Secondary | ICD-10-CM | POA: Diagnosis not present

## 2017-11-09 DIAGNOSIS — H401111 Primary open-angle glaucoma, right eye, mild stage: Secondary | ICD-10-CM | POA: Diagnosis not present

## 2017-11-09 DIAGNOSIS — H3122 Choroidal dystrophy (central areolar) (generalized) (peripapillary): Secondary | ICD-10-CM | POA: Diagnosis not present

## 2017-11-09 DIAGNOSIS — H353121 Nonexudative age-related macular degeneration, left eye, early dry stage: Secondary | ICD-10-CM | POA: Diagnosis not present

## 2017-11-09 DIAGNOSIS — Z961 Presence of intraocular lens: Secondary | ICD-10-CM | POA: Diagnosis not present

## 2017-11-09 DIAGNOSIS — H1131 Conjunctival hemorrhage, right eye: Secondary | ICD-10-CM | POA: Diagnosis not present

## 2017-11-09 DIAGNOSIS — H401121 Primary open-angle glaucoma, left eye, mild stage: Secondary | ICD-10-CM | POA: Diagnosis not present

## 2017-11-19 ENCOUNTER — Other Ambulatory Visit: Payer: Self-pay | Admitting: "Endocrinology

## 2017-11-24 DIAGNOSIS — L820 Inflamed seborrheic keratosis: Secondary | ICD-10-CM | POA: Diagnosis not present

## 2017-11-24 DIAGNOSIS — Z1283 Encounter for screening for malignant neoplasm of skin: Secondary | ICD-10-CM | POA: Diagnosis not present

## 2017-11-24 DIAGNOSIS — D225 Melanocytic nevi of trunk: Secondary | ICD-10-CM | POA: Diagnosis not present

## 2017-12-24 ENCOUNTER — Other Ambulatory Visit: Payer: Self-pay | Admitting: "Endocrinology

## 2018-01-12 DIAGNOSIS — E118 Type 2 diabetes mellitus with unspecified complications: Secondary | ICD-10-CM | POA: Diagnosis not present

## 2018-01-12 DIAGNOSIS — E1165 Type 2 diabetes mellitus with hyperglycemia: Secondary | ICD-10-CM | POA: Diagnosis not present

## 2018-01-12 DIAGNOSIS — Z794 Long term (current) use of insulin: Secondary | ICD-10-CM | POA: Diagnosis not present

## 2018-01-13 LAB — COMPLETE METABOLIC PANEL WITH GFR
AG RATIO: 1.3 (calc) (ref 1.0–2.5)
ALT: 15 U/L (ref 6–29)
AST: 11 U/L (ref 10–35)
Albumin: 3.7 g/dL (ref 3.6–5.1)
Alkaline phosphatase (APISO): 114 U/L (ref 33–130)
BILIRUBIN TOTAL: 0.3 mg/dL (ref 0.2–1.2)
BUN: 7 mg/dL (ref 7–25)
CALCIUM: 8.9 mg/dL (ref 8.6–10.4)
CHLORIDE: 106 mmol/L (ref 98–110)
CO2: 31 mmol/L (ref 20–32)
Creat: 0.65 mg/dL (ref 0.50–0.99)
GFR, EST AFRICAN AMERICAN: 106 mL/min/{1.73_m2} (ref >=60)
GFR, EST NON AFRICAN AMERICAN: 91 mL/min/{1.73_m2} (ref >=60)
GLOBULIN: 2.8 g/dL (ref 1.9–3.7)
Glucose, Bld: 152 mg/dL — ABNORMAL HIGH (ref 65–99)
Potassium: 4.2 mmol/L (ref 3.5–5.3)
SODIUM: 141 mmol/L (ref 135–146)
Total Protein: 6.5 g/dL (ref 6.1–8.1)

## 2018-01-13 LAB — MICROALBUMIN / CREATININE URINE RATIO
Creatinine, Urine: 49 mg/dL (ref 20–275)
Microalb Creat Ratio: 4 mcg/mg creat (ref <30)
Microalb, Ur: 0.2 mg/dL

## 2018-01-13 LAB — HEMOGLOBIN A1C
HEMOGLOBIN A1C: 9.4 %{Hb} — AB (ref <5.7)
Mean Plasma Glucose: 223 (calc)
eAG (mmol/L): 12.4 (calc)

## 2018-01-13 LAB — TSH: TSH: 1.41 mIU/L (ref 0.40–4.50)

## 2018-01-13 LAB — T4, FREE: FREE T4: 0.9 ng/dL (ref 0.8–1.8)

## 2018-01-19 ENCOUNTER — Ambulatory Visit (INDEPENDENT_AMBULATORY_CARE_PROVIDER_SITE_OTHER): Payer: Medicare Other | Admitting: "Endocrinology

## 2018-01-19 ENCOUNTER — Encounter: Payer: Self-pay | Admitting: "Endocrinology

## 2018-01-19 VITALS — BP 142/74 | HR 81 | Ht 62.0 in | Wt 212.0 lb

## 2018-01-19 DIAGNOSIS — H401133 Primary open-angle glaucoma, bilateral, severe stage: Secondary | ICD-10-CM | POA: Diagnosis not present

## 2018-01-19 DIAGNOSIS — E118 Type 2 diabetes mellitus with unspecified complications: Secondary | ICD-10-CM

## 2018-01-19 DIAGNOSIS — Z780 Asymptomatic menopausal state: Secondary | ICD-10-CM | POA: Diagnosis not present

## 2018-01-19 DIAGNOSIS — I10 Essential (primary) hypertension: Secondary | ICD-10-CM

## 2018-01-19 DIAGNOSIS — Z6837 Body mass index (BMI) 37.0-37.9, adult: Secondary | ICD-10-CM | POA: Diagnosis not present

## 2018-01-19 DIAGNOSIS — Z794 Long term (current) use of insulin: Secondary | ICD-10-CM | POA: Diagnosis not present

## 2018-01-19 DIAGNOSIS — E782 Mixed hyperlipidemia: Secondary | ICD-10-CM | POA: Diagnosis not present

## 2018-01-19 DIAGNOSIS — E1165 Type 2 diabetes mellitus with hyperglycemia: Secondary | ICD-10-CM | POA: Diagnosis not present

## 2018-01-19 DIAGNOSIS — IMO0002 Reserved for concepts with insufficient information to code with codable children: Secondary | ICD-10-CM

## 2018-01-19 DIAGNOSIS — F17200 Nicotine dependence, unspecified, uncomplicated: Secondary | ICD-10-CM

## 2018-01-19 DIAGNOSIS — E119 Type 2 diabetes mellitus without complications: Secondary | ICD-10-CM | POA: Diagnosis not present

## 2018-01-19 LAB — HM DIABETES EYE EXAM

## 2018-01-19 MED ORDER — INSULIN LISPRO 100 UNIT/ML (KWIKPEN): 22.0000 [IU] | PEN_INJECTOR | Freq: Three times a day (TID) | SUBCUTANEOUS | 2 refills | Status: DC

## 2018-01-19 NOTE — Progress Notes (Signed)
Subjective:    Patient ID: Vicki Moon, female    DOB: 08/02/1951, PCP Deloria Lair., MD   Past Medical History:  Diagnosis Date  . Diabetes mellitus without complication (HCC)    Type 2 IDDM x 5 years  . Glaucoma   . Herniated lumbar intervertebral disc 06/2014  . PONV (postoperative nausea and vomiting)    Past Surgical History:  Procedure Laterality Date  . CHOLECYSTECTOMY    . COLONOSCOPY N/A 06/28/2015   Procedure: COLONOSCOPY;  Surgeon: Rogene Houston, MD;  [facility];  Service: Endoscopy;  Laterality: N/A;  155  . EYE SURGERY    . LUMBAR LAMINECTOMY Right 07/12/2014   Procedure: LUMBAR FIVE TO SACRAL ONE MICRODISCECTOMY;  Surgeon: Marybelle Killings, MD;  Location: [facility];  Service: Orthopedics;  Laterality: Right;  . TUBAL LIGATION    . VITRECTOMY 25 GAUGE WITH SCLERAL BUCKLE Left 07/29/2017   Procedure: RETINAL DETACHMENT REPAIR LEFT EYE WITH PARSPLANA VITRECTOMY, AIR FLUID EXCHANGE, ENDO LASTER, DRAINAGE OF SUBRETINAL FLUID,ENDOLASER PPV /25 GAUGE;  Surgeon: Jalene Mullet, MD;  Location: [facility];  Service: Ophthalmology;  Laterality: Left;   Social History   Socioeconomic History  . Marital status: Married    Spouse name: None  . Number of children: None  . Years of education: None  . Highest education level: None  Social Needs  . Financial resource strain: None  . Food insecurity - worry: None  . Food insecurity - inability: None  . Transportation needs - medical: None  . Transportation needs - non-medical: None  Occupational History  . None  Tobacco Use  . Smoking status: Current Some Day Smoker    Packs/day: 1.00    Years: 10.00    Pack years: 10.00    Types: Cigarettes  . Smokeless tobacco: Never Used  . Tobacco comment:  1/2 pack a day since age 64  Substance and Sexual Activity  . Alcohol use: No    Alcohol/week: 0.0 oz  . Drug use: No  . Sexual activity: Yes  Other Topics Concern  . None  Social History Narrative  . None    Outpatient Encounter Medications as of 01/19/2018  Medication Sig  . aspirin 81 MG tablet Take 81 mg by mouth daily.  . B-D ULTRAFINE III SHORT PEN 31G X 8 MM MISC USE ONE PEN NEEDLE 4 TIMES DAILY  . Continuous Blood Gluc Sensor (FREESTYLE LIBRE SENSOR SYSTEM) MISC Use one sensor every 10 days.  . Insulin Glargine (LANTUS SOLOSTAR ) Inject 80 Units into the skin at bedtime.  . insulin lispro (HUMALOG KWIKPEN) 100 UNIT/ML KiwkPen Inject 0.22-0.28 mLs (22-28 Units total) into the skin 3 (three) times daily with meals.  . latanoprost (XALATAN) 0.005 % ophthalmic solution Place 1 drop into both eyes at bedtime.  Marland Kitchen lisinopril (PRINIVIL,ZESTRIL) 10 MG tablet TAKE 1 TABLET BY MOUTH ONCE DAILY  . losartan (COZAAR) 25 MG tablet Take 25 mg by mouth daily.  . Multiple Vitamins-Minerals (PRESERVISION AREDS PO) Take by mouth 2 (two) times daily.  Glory Rosebush VERIO test strip USE 1 STRIP TO CHECK GLUCOSE 4 TIMES DAILY  . [DISCONTINUED] insulin lispro (HUMALOG KWIKPEN) 100 UNIT/ML KiwkPen Inject 0.2-0.26 mLs (20-26 Units total) into the skin 3 (three) times daily with meals.  . [DISCONTINUED] LANTUS SOLOSTAR 100 UNIT/ML Solostar Pen INJECT 80 UNITS SUBCUTANEOUSLY IN THE EVENING AT  10  PM   No facility-administered encounter medications on file as of 01/19/2018.    ALLERGIES: No  Known Allergies VACCINATION STATUS:  There is no immunization history on file for this patient.  Diabetes  She presents for her follow-up diabetic visit. She has type 2 diabetes mellitus. Onset time: She was diagnosed at approximate age of 67 years. Her disease course has been worsening. There are no hypoglycemic associated symptoms. Pertinent negatives for hypoglycemia include no confusion, headaches, pallor or seizures. Associated symptoms include polydipsia, polyphagia and polyuria. Pertinent negatives for diabetes include no chest pain. There are no hypoglycemic complications. Symptoms are worsening. There are no diabetic  complications. Risk factors for coronary artery disease include diabetes mellitus, hypertension, sedentary lifestyle and tobacco exposure. Current diabetic treatment includes insulin injections and oral agent (monotherapy). She is compliant with treatment most of the time. Her weight is increasing steadily. She is following a generally unhealthy diet. When asked about meal planning, she reported none. She has had a previous visit with a dietitian. She never participates in exercise. Her home blood glucose trend is decreasing steadily. Her breakfast blood glucose range is generally 180-200 mg/dl. Her lunch blood glucose range is generally 180-200 mg/dl. Her dinner blood glucose range is generally 180-200 mg/dl. Her bedtime blood glucose range is generally >200 mg/dl. Her overall blood glucose range is 180-200 mg/dl. An ACE inhibitor/angiotensin II receptor blocker is being taken. Eye exam is current.  Hypertension  This is a chronic problem. The current episode started more than 1 year ago. The problem is uncontrolled. Pertinent negatives include no chest pain, headaches, palpitations or shortness of breath. Risk factors for coronary artery disease include smoking/tobacco exposure, diabetes mellitus and sedentary lifestyle. Past treatments include ACE inhibitors. The current treatment provides moderate improvement.     Review of Systems  Constitutional: Negative for chills, fever and unexpected weight change.  HENT: Positive for sinus pressure. Negative for trouble swallowing and voice change.   Eyes: Negative for visual disturbance.  Respiratory: Negative for cough, shortness of breath and wheezing.   Cardiovascular: Negative for chest pain, palpitations and leg swelling.  Gastrointestinal: Negative for diarrhea, nausea and vomiting.  Endocrine: Positive for polydipsia, polyphagia and polyuria. Negative for cold intolerance and heat intolerance.  Musculoskeletal: Negative for arthralgias and myalgias.   Skin: Negative for color change, pallor, rash and wound.  Neurological: Negative for seizures and headaches.  Psychiatric/Behavioral: Negative for confusion and suicidal ideas.    Objective:    BP (!) 142/74   Pulse 81   Ht 5\' 2"  (1.575 m)   Wt 212 lb (96.2 kg)   BMI 38.78 kg/m   Wt Readings from Last 3 Encounters:  01/19/18 212 lb (96.2 kg)  10/14/17 204 lb (92.5 kg)  07/29/17 201 lb (91.2 kg)    Physical Exam  Constitutional: She is oriented to person, place, and time. She appears well-developed.  HENT:  Head: Normocephalic and atraumatic.  Eyes: EOM are normal.  Neck: Normal range of motion. Neck supple. No tracheal deviation present. No thyromegaly present.  Cardiovascular: Normal rate and regular rhythm.  Pulmonary/Chest: Effort normal and breath sounds normal.  Abdominal: Soft. Bowel sounds are normal. There is no tenderness. There is no guarding.  Musculoskeletal: Normal range of motion. She exhibits no edema.  Neurological: She is alert and oriented to person, place, and time. She has normal reflexes. No cranial nerve deficit. Coordination normal.  Skin: Skin is warm and dry. No rash noted. No erythema. No pallor.  Psychiatric: She has a normal mood and affect. Judgment normal.    CMP     Component Value  Date/Time   NA 141 01/12/2018 1004   K 4.2 01/12/2018 1004   CL 106 01/12/2018 1004   CO2 31 01/12/2018 1004   GLUCOSE 152 (H) 01/12/2018 1004   BUN 7 01/12/2018 1004   CREATININE 0.65 01/12/2018 1004   CALCIUM 8.9 01/12/2018 1004   PROT 6.5 01/12/2018 1004   ALBUMIN 3.7 07/07/2017 0832   AST 11 01/12/2018 1004   ALT 15 01/12/2018 1004   ALKPHOS 105 07/07/2017 0832   BILITOT 0.3 01/12/2018 1004   GFRNONAA 91 01/12/2018 1004   GFRAA 106 01/12/2018 1004    Diabetic Labs (most recent): Lab Results  Component Value Date   HGBA1C 9.4 (H) 01/12/2018   HGBA1C 8.5 (H) 10/07/2017   HGBA1C 8.1 (H) 07/07/2017    Assessment & Plan:   1. Uncontrolled type  2 diabetes mellitus with complication, with long-term current use of insulin ([place]) - patient remains at a high risk for more acute and chronic complications of diabetes which include CAD, CVA, CKD, retinopathy, and neuropathy. These are all discussed in detail with the patient.  Patient came with significantly above target glycemic profile,  Admits to dietary indiscretion during the Holiday season. -Her most recent A1c is higher at 9.4%, progressively increasing from 8.1% .   Glucose logs and insulin administration records pertaining to this visit,  to be scanned into patient's records.  Recent labs reviewed.   - I have re-counseled the patient on diet management  by adopting a carbohydrate restricted / protein rich  Diet.  -  Suggestion is made for her to avoid simple carbohydrates  from her diet including Cakes, Sweet Desserts / Pastries, Ice Cream, Soda (diet and regular), Sweet Tea, Candies, Chips, Cookies, Store Bought Juices, Alcohol in Excess of  1-2 drinks a day, Artificial Sweeteners, and "Sugar-free" Products. This will help patient to have stable blood glucose profile and potentially avoid unintended weight gain.   - Patient is advised to stick to a routine mealtimes to eat 3 meals  a day and avoid unnecessary snacks ( to snack only to correct hypoglycemia).   - I have approached patient with the following individualized plan to manage diabetes and patient agrees.  -  She admits she had had dietary indiscretion during the Holidays. -  I will continue  Lantus 80 units daily at bedtime,  increase Humalog to 22 units 3 times a day before meals for  pre-meal BG readings of 70-150mg /dl, plus patient specific correction dose of rapid acting insulin  for unexpected hyperglycemia above 150mg /dl, associated with strict documenting of glucose 4 times a day-before meals and at bedtime.  - She is benefiting from  continuous glucose monitoring- Freestyle Libre device.    - Patient is warned not  to take insulin without proper monitoring per orders. -Adjustment parameters are given for hypo and hyperglycemia in writing. -Patient is encouraged to call clinic for blood glucose levels less than 70 or above 200 mg /dl. - She  did not tolerate metformin, her GI complaints have disappeared after she stopped metformin.   - I advised her to stay off of metformin.  -  CT scan of her abdomen is negative for adrenal neoplastic process,  positive for diffuse diverticulosis, small left nephrolithiasis, and  calcified aortic atherosclerosis.  - Anti-islet cell antibodies undetectable.   - She is not suitable candidate for incretin therapy for now, remains a heavy smoker. - Patient specific target  for A1c; LDL, HDL, Triglycerides, and  Waist Circumference were discussed in  detail.  2) BP/HTN: Uncontrolled .  continue current medications including ACEI/ARB. 3) Lipids/HPL: Controlled, LDL 84,  she is not on statins.  4)  Weight/Diet: She has gained weight. CDE consult in progress, exercise, and carbohydrates information provided. - She could not afford the prescription for Qsymia.  5. Vitamin D deficiency:  she is status post vitamin D replacement.  6) Chronic Care/Health Maintenance:  -Patient  is on ACEI/ARB medications and encouraged to continue to follow up with Ophthalmology, Podiatrist at least yearly or according to recommendations, and advised to quit smoking. I have recommended yearly flu vaccine and pneumonia vaccination at least every 5 years; moderate intensity exercise for up to 150 minutes weekly; and  sleep for at least 7 hours a day. -she is offered DXA as screening.  - Time spent with the patient: 25 min, of which >50% was spent in reviewing her blood glucose logs , discussing her hypo- and hyper-glycemic episodes, reviewing her current and  previous labs and insulin doses and developing a plan to avoid hypo- and hyper-glycemia. Please refer to Patient Instructions for Blood  Glucose Monitoring and Insulin/Medications Dosing Guide"  in media tab for additional information.   - I advised patient to maintain close follow up with Deloria Lair., MD for primary care needs.     Follow up plan: -Return in about 3 months (around 04/19/2018) for meter, and logs, follow up with pre-visit labs, meter, and logs.  Glade Lloyd, MD Phone: [phone]  Fax: [phone]   -  This note was partially dictated with voice recognition software. Similar sounding words can be transcribed inadequately or may not  be corrected upon review.  01/19/2018, 9:26 AM

## 2018-01-19 NOTE — Patient Instructions (Signed)

## 2018-02-19 DIAGNOSIS — R062 Wheezing: Secondary | ICD-10-CM | POA: Diagnosis not present

## 2018-02-19 DIAGNOSIS — L299 Pruritus, unspecified: Secondary | ICD-10-CM | POA: Diagnosis not present

## 2018-02-19 DIAGNOSIS — H6093 Unspecified otitis externa, bilateral: Secondary | ICD-10-CM | POA: Diagnosis not present

## 2018-02-19 DIAGNOSIS — R0689 Other abnormalities of breathing: Secondary | ICD-10-CM | POA: Diagnosis not present

## 2018-02-19 DIAGNOSIS — R05 Cough: Secondary | ICD-10-CM | POA: Diagnosis not present

## 2018-02-19 DIAGNOSIS — F39 Unspecified mood [affective] disorder: Secondary | ICD-10-CM | POA: Diagnosis not present

## 2018-03-18 DIAGNOSIS — R5381 Other malaise: Secondary | ICD-10-CM | POA: Diagnosis not present

## 2018-03-18 DIAGNOSIS — R5383 Other fatigue: Secondary | ICD-10-CM | POA: Diagnosis not present

## 2018-03-18 DIAGNOSIS — H3530 Unspecified macular degeneration: Secondary | ICD-10-CM | POA: Diagnosis not present

## 2018-03-18 DIAGNOSIS — E249 Cushing's syndrome, unspecified: Secondary | ICD-10-CM | POA: Diagnosis not present

## 2018-03-18 DIAGNOSIS — F39 Unspecified mood [affective] disorder: Secondary | ICD-10-CM | POA: Diagnosis not present

## 2018-03-18 DIAGNOSIS — F17200 Nicotine dependence, unspecified, uncomplicated: Secondary | ICD-10-CM | POA: Diagnosis not present

## 2018-03-18 DIAGNOSIS — Z6838 Body mass index (BMI) 38.0-38.9, adult: Secondary | ICD-10-CM | POA: Diagnosis not present

## 2018-03-18 DIAGNOSIS — E1165 Type 2 diabetes mellitus with hyperglycemia: Secondary | ICD-10-CM | POA: Diagnosis not present

## 2018-03-18 DIAGNOSIS — H401190 Primary open-angle glaucoma, unspecified eye, stage unspecified: Secondary | ICD-10-CM | POA: Diagnosis not present

## 2018-03-18 DIAGNOSIS — I10 Essential (primary) hypertension: Secondary | ICD-10-CM | POA: Diagnosis not present

## 2018-04-20 DIAGNOSIS — H401133 Primary open-angle glaucoma, bilateral, severe stage: Secondary | ICD-10-CM | POA: Diagnosis not present

## 2018-04-20 DIAGNOSIS — E119 Type 2 diabetes mellitus without complications: Secondary | ICD-10-CM | POA: Diagnosis not present

## 2018-04-22 ENCOUNTER — Ambulatory Visit: Payer: Medicare Other | Admitting: "Endocrinology

## 2018-05-03 DIAGNOSIS — E1169 Type 2 diabetes mellitus with other specified complication: Secondary | ICD-10-CM | POA: Diagnosis not present

## 2018-05-03 DIAGNOSIS — I10 Essential (primary) hypertension: Secondary | ICD-10-CM | POA: Diagnosis not present

## 2018-05-03 DIAGNOSIS — R261 Paralytic gait: Secondary | ICD-10-CM | POA: Diagnosis not present

## 2018-05-03 DIAGNOSIS — E782 Mixed hyperlipidemia: Secondary | ICD-10-CM | POA: Diagnosis not present

## 2018-05-03 DIAGNOSIS — E559 Vitamin D deficiency, unspecified: Secondary | ICD-10-CM | POA: Diagnosis not present

## 2018-05-05 DIAGNOSIS — E559 Vitamin D deficiency, unspecified: Secondary | ICD-10-CM | POA: Diagnosis not present

## 2018-05-05 DIAGNOSIS — R5383 Other fatigue: Secondary | ICD-10-CM | POA: Diagnosis not present

## 2018-05-05 DIAGNOSIS — L209 Atopic dermatitis, unspecified: Secondary | ICD-10-CM | POA: Diagnosis not present

## 2018-05-05 DIAGNOSIS — E1165 Type 2 diabetes mellitus with hyperglycemia: Secondary | ICD-10-CM | POA: Diagnosis not present

## 2018-05-05 DIAGNOSIS — R5381 Other malaise: Secondary | ICD-10-CM | POA: Diagnosis not present

## 2018-05-05 DIAGNOSIS — F39 Unspecified mood [affective] disorder: Secondary | ICD-10-CM | POA: Diagnosis not present

## 2018-05-05 DIAGNOSIS — E782 Mixed hyperlipidemia: Secondary | ICD-10-CM | POA: Diagnosis not present

## 2018-05-05 DIAGNOSIS — H4010X0 Unspecified open-angle glaucoma, stage unspecified: Secondary | ICD-10-CM | POA: Diagnosis not present

## 2018-05-05 DIAGNOSIS — E249 Cushing's syndrome, unspecified: Secondary | ICD-10-CM | POA: Diagnosis not present

## 2018-05-05 DIAGNOSIS — I10 Essential (primary) hypertension: Secondary | ICD-10-CM | POA: Diagnosis not present

## 2018-05-05 DIAGNOSIS — H3530 Unspecified macular degeneration: Secondary | ICD-10-CM | POA: Diagnosis not present

## 2018-05-19 DIAGNOSIS — L309 Dermatitis, unspecified: Secondary | ICD-10-CM | POA: Diagnosis not present

## 2018-05-19 DIAGNOSIS — L858 Other specified epidermal thickening: Secondary | ICD-10-CM | POA: Diagnosis not present

## 2018-05-19 DIAGNOSIS — L281 Prurigo nodularis: Secondary | ICD-10-CM | POA: Diagnosis not present

## 2018-05-19 DIAGNOSIS — D2261 Melanocytic nevi of right upper limb, including shoulder: Secondary | ICD-10-CM | POA: Diagnosis not present

## 2018-05-19 DIAGNOSIS — D485 Neoplasm of uncertain behavior of skin: Secondary | ICD-10-CM | POA: Diagnosis not present

## 2018-06-02 DIAGNOSIS — L570 Actinic keratosis: Secondary | ICD-10-CM | POA: Diagnosis not present

## 2018-06-02 DIAGNOSIS — L111 Transient acantholytic dermatosis [Grover]: Secondary | ICD-10-CM | POA: Diagnosis not present

## 2018-06-02 DIAGNOSIS — L309 Dermatitis, unspecified: Secondary | ICD-10-CM | POA: Diagnosis not present

## 2018-06-02 DIAGNOSIS — F17200 Nicotine dependence, unspecified, uncomplicated: Secondary | ICD-10-CM | POA: Diagnosis not present

## 2018-06-02 DIAGNOSIS — E1169 Type 2 diabetes mellitus with other specified complication: Secondary | ICD-10-CM | POA: Diagnosis not present

## 2018-06-02 DIAGNOSIS — L299 Pruritus, unspecified: Secondary | ICD-10-CM | POA: Diagnosis not present

## 2018-06-02 DIAGNOSIS — E559 Vitamin D deficiency, unspecified: Secondary | ICD-10-CM | POA: Diagnosis not present

## 2018-07-12 DIAGNOSIS — L570 Actinic keratosis: Secondary | ICD-10-CM | POA: Diagnosis not present

## 2018-07-12 DIAGNOSIS — L111 Transient acantholytic dermatosis [Grover]: Secondary | ICD-10-CM | POA: Diagnosis not present

## 2018-07-19 DIAGNOSIS — H401133 Primary open-angle glaucoma, bilateral, severe stage: Secondary | ICD-10-CM | POA: Diagnosis not present

## 2018-08-10 DIAGNOSIS — E1169 Type 2 diabetes mellitus with other specified complication: Secondary | ICD-10-CM | POA: Diagnosis not present

## 2018-08-10 DIAGNOSIS — E1165 Type 2 diabetes mellitus with hyperglycemia: Secondary | ICD-10-CM | POA: Diagnosis not present

## 2018-08-10 DIAGNOSIS — E559 Vitamin D deficiency, unspecified: Secondary | ICD-10-CM | POA: Diagnosis not present

## 2018-08-10 DIAGNOSIS — E782 Mixed hyperlipidemia: Secondary | ICD-10-CM | POA: Diagnosis not present

## 2018-08-10 DIAGNOSIS — I10 Essential (primary) hypertension: Secondary | ICD-10-CM | POA: Diagnosis not present

## 2018-08-12 ENCOUNTER — Ambulatory Visit (INDEPENDENT_AMBULATORY_CARE_PROVIDER_SITE_OTHER): Payer: Medicare Other | Admitting: Orthopaedic Surgery

## 2018-08-12 ENCOUNTER — Encounter (INDEPENDENT_AMBULATORY_CARE_PROVIDER_SITE_OTHER): Payer: Self-pay | Admitting: Orthopaedic Surgery

## 2018-08-12 VITALS — BP 124/61 | HR 83 | Ht 62.0 in | Wt 190.0 lb

## 2018-08-12 DIAGNOSIS — M7061 Trochanteric bursitis, right hip: Secondary | ICD-10-CM | POA: Diagnosis not present

## 2018-08-12 MED ORDER — METHYLPREDNISOLONE ACETATE 40 MG/ML IJ SUSP
40.0000 mg | INTRAMUSCULAR | Status: AC | PRN
  Administered 2018-08-12: 40 mg via INTRA_ARTICULAR

## 2018-08-12 MED ORDER — LIDOCAINE HCL 1 % IJ SOLN
0.5000 mL | INTRAMUSCULAR | Status: AC | PRN
  Administered 2018-08-12: .5 mL

## 2018-08-12 MED ORDER — BUPIVACAINE HCL 0.5 % IJ SOLN
2.0000 mL | INTRAMUSCULAR | Status: AC | PRN
  Administered 2018-08-12: 2 mL via INTRA_ARTICULAR

## 2018-08-12 NOTE — Progress Notes (Signed)
Office Visit Note   Patient: Vicki Moon           Date of Birth: 1951/10/09           MRN: [id number] Visit Date: 08/12/2018              Requested by: Deloria Lair., MD [place], [place] [id number] PCP: [name], MD   Assessment & Plan: Visit Diagnoses:  1. Trochanteric bursitis, right hip     Plan: Right trochanteric injection performed which she tolerated well.  I can check her back again in 4 weeks.  If she develops increased symptoms she can return earlier.  Follow-Up Instructions: No follow-ups on file.   Orders:  Orders Placed This Encounter  Procedures  . Large Joint Inj: R greater trochanter   No orders of the defined types were placed in this encounter.     Procedures: Large Joint Inj: R greater trochanter on 08/12/2018 11:08 AM Details: lateral approach Medications: 0.5 mL lidocaine 1 %; 2 mL bupivacaine 0.5 %; 40 mg methylPREDNISolone acetate 40 MG/ML      Clinical Data: No additional findings.   Subjective: Chief Complaint  Patient presents with  . Right Leg - Pain  . Left Leg - Pain    HPI 67 year old female returns and is having problems with pain in both lateral thighs.  Symptoms have been present for 4 months she still walks a mile a day without neurogenic claudication symptoms but walks with a slow stride gait with a waddle gait.  Previous right L5-S1 microdiscectomy done by me in 2015.  She was doing well until 4 months ago.  She occasionally has sharp shocklike pain laterally from the trochanter down to the mid to distal thigh.  Does not radiate past her knee.  No associated bowel or bladder symptoms no chills or fever no erythema of her lumbar incision.  He states walking makes her pain worse.  Patient had CT abdomen and pelvis in 2016 1 year after surgery at L5-S1 which showed some disc space narrowing at L5-S1 and suggested paracentral disc protrusion at  L 5-S 1.  Review of Systems positive for previous eye  surgery, hyperlipidemia, hypertension, type 2 diabetes on insulin positive for smoking vitamin D deficiency, weight gain.   Objective: Vital Signs: BP 124/61   Pulse 83   Ht 5\' 2"  (1.575 m)   Wt 190 lb (86.2 kg)   BMI 34.75 kg/m   Physical Exam  Constitutional: She is oriented to person, place, and time. She appears well-developed.  HENT:  Head: Normocephalic.  Right Ear: External ear normal.  Left Ear: External ear normal.  Eyes: Pupils are equal, round, and reactive to light.  Neck: No tracheal deviation present. No thyromegaly present.  Cardiovascular: Normal rate.  Pulmonary/Chest: Effort normal.  Abdominal: Soft.  Neurological: She is alert and oriented to person, place, and time.  Skin: Skin is warm and dry.  Psychiatric: She has a normal mood and affect. Her behavior is normal.    Ortho Exam patient has negative logroll to the hips negative straight leg raising.  Lumbar incision is well-healed.  Corky Sox test is negative.  Knees reach full extension.  She has exquisite tenderness over both trochanters worse on the right than left.  Distal pulses are intact negative [place]oman.  Knee and ankle jerk are intact.  Specialty Comments:  No specialty comments available.  Imaging: No results found.   PMFS History: Patient Active Problem  List   Diagnosis Date Noted  . Trochanteric bursitis, right hip 08/12/2018  . Post-menopausal 01/19/2018  . Current smoker 01/19/2018  . Mixed hyperlipidemia 07/13/2017  . Class 2 severe obesity due to excess calories with serious comorbidity and body mass index (BMI) of 37.0 to 37.9 in adult ([place]) 07/13/2017  . Hypercortisolemia ([place]) 01/20/2017  . Excessive weight gain 01/20/2017  . Generalized abdominal pain 01/20/2017  . Uncontrolled type 2 diabetes mellitus with complication, with long-term current use of insulin ([place]) 12/20/2015  . Essential hypertension, benign 12/20/2015  . Vitamin D deficiency 12/20/2015  . Rectal bleeding 05/29/2015    . HNP (herniated nucleus pulposus), lumbar 07/12/2014    Class: Diagnosis of   Past Medical History:  Diagnosis Date  . Diabetes mellitus without complication (HCC)    Type 2 IDDM x 5 years  . Glaucoma   . Herniated lumbar intervertebral disc 06/2014  . PONV (postoperative nausea and vomiting)     Family History  Problem Relation Age of Onset  . Hypertension Mother   . Stroke Mother   . Cancer Father        brain tumor  . Diabetes Father     Past Surgical History:  Procedure Laterality Date  . CHOLECYSTECTOMY    . COLONOSCOPY N/A 06/28/2015   Procedure: COLONOSCOPY;  Surgeon: Rogene Houston, MD;  [facility];  Service: Endoscopy;  Laterality: N/A;  155  . EYE SURGERY    . LUMBAR LAMINECTOMY Right 07/12/2014   Procedure: LUMBAR FIVE TO SACRAL ONE MICRODISCECTOMY;  Surgeon: Marybelle Killings, MD;  Location: [facility];  Service: Orthopedics;  Laterality: Right;  . TUBAL LIGATION    . VITRECTOMY 25 GAUGE WITH SCLERAL BUCKLE Left 07/29/2017   Procedure: RETINAL DETACHMENT REPAIR LEFT EYE WITH PARSPLANA VITRECTOMY, AIR FLUID EXCHANGE, ENDO LASTER, DRAINAGE OF SUBRETINAL FLUID,ENDOLASER PPV /25 GAUGE;  Surgeon: Jalene Mullet, MD;  Location: [facility];  Service: Ophthalmology;  Laterality: Left;   Social History   Occupational History  . Not on file  Tobacco Use  . Smoking status: Current Some Day Smoker    Packs/day: 1.00    Years: 10.00    Pack years: 10.00    Types: Cigarettes  . Smokeless tobacco: Never Used  . Tobacco comment:  1/2 pack a day since age 22  Substance and Sexual Activity  . Alcohol use: No    Alcohol/week: 0.0 standard drinks  . Drug use: No  . Sexual activity: Yes

## 2018-08-17 DIAGNOSIS — I10 Essential (primary) hypertension: Secondary | ICD-10-CM | POA: Diagnosis not present

## 2018-08-17 DIAGNOSIS — F39 Unspecified mood [affective] disorder: Secondary | ICD-10-CM | POA: Diagnosis not present

## 2018-08-17 DIAGNOSIS — E1165 Type 2 diabetes mellitus with hyperglycemia: Secondary | ICD-10-CM | POA: Diagnosis not present

## 2018-08-17 DIAGNOSIS — E559 Vitamin D deficiency, unspecified: Secondary | ICD-10-CM | POA: Diagnosis not present

## 2018-08-17 DIAGNOSIS — L209 Atopic dermatitis, unspecified: Secondary | ICD-10-CM | POA: Diagnosis not present

## 2018-08-17 DIAGNOSIS — E249 Cushing's syndrome, unspecified: Secondary | ICD-10-CM | POA: Diagnosis not present

## 2018-08-17 DIAGNOSIS — R5383 Other fatigue: Secondary | ICD-10-CM | POA: Diagnosis not present

## 2018-08-17 DIAGNOSIS — E782 Mixed hyperlipidemia: Secondary | ICD-10-CM | POA: Diagnosis not present

## 2018-08-17 DIAGNOSIS — H3530 Unspecified macular degeneration: Secondary | ICD-10-CM | POA: Diagnosis not present

## 2018-08-17 DIAGNOSIS — H4010X0 Unspecified open-angle glaucoma, stage unspecified: Secondary | ICD-10-CM | POA: Diagnosis not present

## 2018-08-17 DIAGNOSIS — R5381 Other malaise: Secondary | ICD-10-CM | POA: Diagnosis not present

## 2018-08-26 ENCOUNTER — Ambulatory Visit (INDEPENDENT_AMBULATORY_CARE_PROVIDER_SITE_OTHER): Payer: Medicare Other | Admitting: Orthopaedic Surgery

## 2018-08-26 ENCOUNTER — Encounter (INDEPENDENT_AMBULATORY_CARE_PROVIDER_SITE_OTHER): Payer: Self-pay | Admitting: Orthopaedic Surgery

## 2018-08-26 VITALS — BP 136/61 | HR 78 | Ht 62.0 in | Wt 190.0 lb

## 2018-08-26 DIAGNOSIS — M7062 Trochanteric bursitis, left hip: Secondary | ICD-10-CM | POA: Diagnosis not present

## 2018-08-26 NOTE — Progress Notes (Signed)
Office Visit Note   Patient: Vicki Moon           Date of Birth: [birth year]           MRN: [id number] Visit Date: 08/26/2018              Requested by: [name], MD [place], [place] [address] PCP: [name], MD   Assessment & Plan: Visit Diagnoses:  1. Trochanteric bursitis, left hip     Plan: We had a discussion concerning hyperglycemia.  Injection was performed to watch her sugar carefully she understands that after about 7 to 9 days her insulin dementia drop back to normal baseline.  If she has persistent problems and a new MRI scan lumbar with and without contrast will be required. Follow-Up Instructions: Return if symptoms worsen or fail to improve.   Orders:  No orders of the defined types were placed in this encounter.  No orders of the defined types were placed in this encounter.     Procedures: Large Joint Inj: L greater trochanter on 08/27/2018 12:59 PM Details: lateral approach Medications: 0.5 mL lidocaine 1 %; 2 mL bupivacaine 0.25 %; 40 mg methylPREDNISolone acetate 40 MG/ML      Clinical Data: No additional findings.   Subjective: Chief Complaint  Patient presents with  . Left Hip - Pain    HPI 67 year old diabetic on insulin pump returns and states the injection in her right trochanter on 08/12/2018 gave her good relief.  She is walking better and now is having increased problems of her left greater trochanter.  She up to her insulin and had one episode where her sugar went up to 500.  She talk with her endocrinologist and is bumped up her insulin pump 50% above the basal rate in anticipation of injection of her right greater trochanter today.  Patient had L5-S1 microdiscectomy 07/12/2014.  A abdominal CT done in 2016 for another problem showed the 5 1 level had narrowed with some evidence of paracentral disc protrusion recurrent.  Review of Systems review updated unchanged from 08/12/2018.   Objective: Vital Signs: BP  136/61   Pulse 78   Ht 5\' 2"  (1.575 m)   Wt 190 lb (86.2 kg)   BMI 34.75 kg/m   Physical Exam  Constitutional: She is oriented to person, place, and time. She appears well-developed.  HENT:  Head: Normocephalic.  Right Ear: External ear normal.  Left Ear: External ear normal.  Eyes: Pupils are equal, round, and reactive to light.  Neck: No tracheal deviation present. No thyromegaly present.  Cardiovascular: Normal rate.  Pulmonary/Chest: Effort normal.  Abdominal: Soft.  Neurological: She is alert and oriented to person, place, and time.  Skin: Skin is warm and dry.  Psychiatric: She has a normal mood and affect. Her behavior is normal.    Ortho Exam negative logroll both hips.  Right greater trochanter nontender left greater trochanter still exquisitely tender.  She ambulates with a left hip limp.  No hip flexion contracture.  Knee and ankle jerk are intact.  Sensation to her feet are intact.  Negative straight leg raising at 90 degrees right and left. Specialty Comments:  No specialty comments available.  Imaging: No results found.   PMFS History: Patient Active Problem List   Diagnosis Date Noted  . Trochanteric bursitis, right hip 08/12/2018  . Post-menopausal 01/19/2018  . Current smoker 01/19/2018  . Mixed hyperlipidemia 07/13/2017  . Class 2 severe obesity due  to excess calories with serious comorbidity and body mass index (BMI) of 37.0 to 37.9 in adult [facility]) 07/13/2017  . Hypercortisolemia ([place]) 01/20/2017  . Excessive weight gain 01/20/2017  . Generalized abdominal pain 01/20/2017  . Uncontrolled type 2 diabetes mellitus with complication, with long-term current use of insulin ([place]) 12/20/2015  . Essential hypertension, benign 12/20/2015  . Vitamin D deficiency 12/20/2015  . Rectal bleeding 05/29/2015  . HNP (herniated nucleus pulposus), lumbar 07/12/2014    Class: Diagnosis of   Past Medical History:  Diagnosis Date  . Diabetes mellitus without  complication (HCC)    Type 2 IDDM x 5 years  . Glaucoma   . Herniated lumbar intervertebral disc 06/2014  . PONV (postoperative nausea and vomiting)     Family History  Problem Relation Age of Onset  . Hypertension Mother   . Stroke Mother   . Cancer Father        brain tumor  . Diabetes Father     Past Surgical History:  Procedure Laterality Date  . CHOLECYSTECTOMY    . COLONOSCOPY N/A 06/28/2015   Procedure: COLONOSCOPY;  Surgeon: Rogene Houston, MD;  [facility];  Service: Endoscopy;  Laterality: N/A;  155  . EYE SURGERY    . LUMBAR LAMINECTOMY Right 07/12/2014   Procedure: LUMBAR FIVE TO SACRAL ONE MICRODISCECTOMY;  Surgeon: Marybelle Killings, MD;  Location: [facility];  Service: Orthopedics;  Laterality: Right;  . TUBAL LIGATION    . VITRECTOMY 25 GAUGE WITH SCLERAL BUCKLE Left 07/29/2017   Procedure: RETINAL DETACHMENT REPAIR LEFT EYE WITH PARSPLANA VITRECTOMY, AIR FLUID EXCHANGE, ENDO LASTER, DRAINAGE OF SUBRETINAL FLUID,ENDOLASER PPV /25 GAUGE;  Surgeon: Jalene Mullet, MD;  Location: [facility];  Service: Ophthalmology;  Laterality: Left;   Social History   Occupational History  . Not on file  Tobacco Use  . Smoking status: Current Some Day Smoker    Packs/day: 1.00    Years: 10.00    Pack years: 10.00    Types: Cigarettes  . Smokeless tobacco: Never Used  . Tobacco comment:  1/2 pack a day since age 54  Substance and Sexual Activity  . Alcohol use: No    Alcohol/week: 0.0 standard drinks  . Drug use: No  . Sexual activity: Yes

## 2018-08-27 ENCOUNTER — Encounter (INDEPENDENT_AMBULATORY_CARE_PROVIDER_SITE_OTHER): Payer: Self-pay | Admitting: Orthopaedic Surgery

## 2018-08-27 DIAGNOSIS — M7062 Trochanteric bursitis, left hip: Secondary | ICD-10-CM | POA: Diagnosis not present

## 2018-08-27 MED ORDER — BUPIVACAINE HCL 0.25 % IJ SOLN
2.0000 mL | INTRAMUSCULAR | Status: AC | PRN
  Administered 2018-08-27: 2 mL via INTRA_ARTICULAR

## 2018-08-27 MED ORDER — METHYLPREDNISOLONE ACETATE 40 MG/ML IJ SUSP
40.0000 mg | INTRAMUSCULAR | Status: AC | PRN
  Administered 2018-08-27: 40 mg via INTRA_ARTICULAR

## 2018-08-27 MED ORDER — LIDOCAINE HCL 1 % IJ SOLN
0.5000 mL | INTRAMUSCULAR | Status: AC | PRN
  Administered 2018-08-27: .5 mL

## 2018-09-14 DIAGNOSIS — R05 Cough: Secondary | ICD-10-CM | POA: Diagnosis not present

## 2018-09-14 DIAGNOSIS — J060 Acute laryngopharyngitis: Secondary | ICD-10-CM | POA: Diagnosis not present

## 2018-09-14 DIAGNOSIS — Z6837 Body mass index (BMI) 37.0-37.9, adult: Secondary | ICD-10-CM | POA: Diagnosis not present

## 2018-09-14 DIAGNOSIS — Z23 Encounter for immunization: Secondary | ICD-10-CM | POA: Diagnosis not present

## 2018-10-19 DIAGNOSIS — H401133 Primary open-angle glaucoma, bilateral, severe stage: Secondary | ICD-10-CM | POA: Diagnosis not present

## 2018-10-19 DIAGNOSIS — H353133 Nonexudative age-related macular degeneration, bilateral, advanced atrophic without subfoveal involvement: Secondary | ICD-10-CM | POA: Diagnosis not present

## 2018-10-26 DIAGNOSIS — H401133 Primary open-angle glaucoma, bilateral, severe stage: Secondary | ICD-10-CM | POA: Diagnosis not present

## 2018-10-28 DIAGNOSIS — Z23 Encounter for immunization: Secondary | ICD-10-CM | POA: Diagnosis not present

## 2018-11-03 DIAGNOSIS — H401190 Primary open-angle glaucoma, unspecified eye, stage unspecified: Secondary | ICD-10-CM | POA: Diagnosis not present

## 2018-11-03 DIAGNOSIS — R05 Cough: Secondary | ICD-10-CM | POA: Diagnosis not present

## 2018-11-03 DIAGNOSIS — I10 Essential (primary) hypertension: Secondary | ICD-10-CM | POA: Diagnosis not present

## 2018-11-03 DIAGNOSIS — R5381 Other malaise: Secondary | ICD-10-CM | POA: Diagnosis not present

## 2018-11-03 DIAGNOSIS — H3530 Unspecified macular degeneration: Secondary | ICD-10-CM | POA: Diagnosis not present

## 2018-11-03 DIAGNOSIS — Z6838 Body mass index (BMI) 38.0-38.9, adult: Secondary | ICD-10-CM | POA: Diagnosis not present

## 2018-11-03 DIAGNOSIS — F39 Unspecified mood [affective] disorder: Secondary | ICD-10-CM | POA: Diagnosis not present

## 2018-11-03 DIAGNOSIS — F17200 Nicotine dependence, unspecified, uncomplicated: Secondary | ICD-10-CM | POA: Diagnosis not present

## 2018-11-03 DIAGNOSIS — E782 Mixed hyperlipidemia: Secondary | ICD-10-CM | POA: Diagnosis not present

## 2018-11-03 DIAGNOSIS — E1165 Type 2 diabetes mellitus with hyperglycemia: Secondary | ICD-10-CM | POA: Diagnosis not present

## 2018-11-03 DIAGNOSIS — R5383 Other fatigue: Secondary | ICD-10-CM | POA: Diagnosis not present

## 2018-11-03 DIAGNOSIS — E1169 Type 2 diabetes mellitus with other specified complication: Secondary | ICD-10-CM | POA: Diagnosis not present

## 2018-11-03 DIAGNOSIS — E249 Cushing's syndrome, unspecified: Secondary | ICD-10-CM | POA: Diagnosis not present

## 2018-11-03 DIAGNOSIS — J060 Acute laryngopharyngitis: Secondary | ICD-10-CM | POA: Diagnosis not present

## 2018-11-25 ENCOUNTER — Encounter (INDEPENDENT_AMBULATORY_CARE_PROVIDER_SITE_OTHER): Payer: Self-pay | Admitting: Orthopaedic Surgery

## 2018-11-25 ENCOUNTER — Ambulatory Visit (INDEPENDENT_AMBULATORY_CARE_PROVIDER_SITE_OTHER): Payer: Medicare Other | Admitting: Orthopaedic Surgery

## 2018-11-25 VITALS — BP 129/67 | HR 75 | Ht 62.0 in | Wt 190.0 lb

## 2018-11-25 DIAGNOSIS — H3530 Unspecified macular degeneration: Secondary | ICD-10-CM | POA: Diagnosis not present

## 2018-11-25 DIAGNOSIS — H4010X0 Unspecified open-angle glaucoma, stage unspecified: Secondary | ICD-10-CM | POA: Diagnosis not present

## 2018-11-25 DIAGNOSIS — E782 Mixed hyperlipidemia: Secondary | ICD-10-CM | POA: Diagnosis not present

## 2018-11-25 DIAGNOSIS — I10 Essential (primary) hypertension: Secondary | ICD-10-CM | POA: Diagnosis not present

## 2018-11-25 DIAGNOSIS — Z6838 Body mass index (BMI) 38.0-38.9, adult: Secondary | ICD-10-CM | POA: Diagnosis not present

## 2018-11-25 DIAGNOSIS — E1169 Type 2 diabetes mellitus with other specified complication: Secondary | ICD-10-CM | POA: Diagnosis not present

## 2018-11-25 DIAGNOSIS — H401190 Primary open-angle glaucoma, unspecified eye, stage unspecified: Secondary | ICD-10-CM | POA: Diagnosis not present

## 2018-11-25 DIAGNOSIS — F39 Unspecified mood [affective] disorder: Secondary | ICD-10-CM | POA: Diagnosis not present

## 2018-11-25 DIAGNOSIS — M7061 Trochanteric bursitis, right hip: Secondary | ICD-10-CM

## 2018-11-25 DIAGNOSIS — E1165 Type 2 diabetes mellitus with hyperglycemia: Secondary | ICD-10-CM | POA: Diagnosis not present

## 2018-11-25 DIAGNOSIS — Z6837 Body mass index (BMI) 37.0-37.9, adult: Secondary | ICD-10-CM | POA: Diagnosis not present

## 2018-11-25 DIAGNOSIS — F17200 Nicotine dependence, unspecified, uncomplicated: Secondary | ICD-10-CM | POA: Diagnosis not present

## 2018-11-25 DIAGNOSIS — E249 Cushing's syndrome, unspecified: Secondary | ICD-10-CM | POA: Diagnosis not present

## 2018-11-25 MED ORDER — LIDOCAINE HCL 1 % IJ SOLN
0.5000 mL | INTRAMUSCULAR | Status: AC | PRN
  Administered 2018-11-25: .5 mL

## 2018-11-25 MED ORDER — BUPIVACAINE HCL 0.25 % IJ SOLN
2.0000 mL | INTRAMUSCULAR | Status: AC | PRN
  Administered 2018-11-25: 2 mL via INTRA_ARTICULAR

## 2018-11-25 MED ORDER — METHYLPREDNISOLONE ACETATE 40 MG/ML IJ SUSP
40.0000 mg | INTRAMUSCULAR | Status: AC | PRN
  Administered 2018-11-25: 40 mg via INTRA_ARTICULAR

## 2018-11-25 NOTE — Progress Notes (Signed)
Office Visit Note   Patient: Vicki Moon           Date of Birth: December 29, 1950           MRN: [id number] Visit Date: 11/25/2018              Requested by: [name], MD [place], [place] [id number] PCP: [name], MD   Assessment & Plan: Visit Diagnoses:  1. Trochanteric bursitis, right hip     Plan: Enteric injection performed.  If she has persistent problems will need to consider lumbar MRI scan with and without contrast to rule out foraminal stenosis with the progressive disc space height loss that is occurred after her surgery in 2015.  Gets good relief the trochanteric injection on the right and does not have problems with hypoglycemia we can consider injecting the left side again.  We discussed weight loss, smoking sensation..  Follow-Up Instructions: No follow-ups on file.   Orders:  Orders Placed This Encounter  Procedures  . Large Joint Inj: R greater trochanter   No orders of the defined types were placed in this encounter.     Procedures: Large Joint Inj: R greater trochanter on 11/25/2018 2:14 PM Details: 22 G 1.5 in needle Medications: 0.5 mL lidocaine 1 %; 2 mL bupivacaine 0.25 %; 40 mg methylPREDNISolone acetate 40 MG/ML      Clinical Data: No additional findings.   Subjective: Chief Complaint  Patient presents with  . Right Hip - Pain  . Left Hip - Pain    HPI 67 year old female returns for recurrent trochanteric bursitis much worse on the right than left she is a diabetic with an insulin pump.  Previous left truck injection August 26, 2018 and right trochanteric injection 08/12/2018.  She fell last weeks having increased pain on her left side but the right side remains more painful.  She has some right anterior groin pain no pain with internal and external rotation of her hip.  She has problems when she gets from sitting to standing problems getting to an upright position and walks with a short stride gait and is been  limping.  Previous right L5-S1 microdiscectomy done by me July 12, 2018.  Patient states she is really not having significant back pain is primarily lateral trochanteric pain.  Review of Systems systems updated unchanged from 08/12/2018.  Mild hyperglycemia requiring increased insulin through her pump after previous trochanteric injection.  Insulin-dependent diabetes previous eye surgery hyperlipidemia hypertension and positive for smoking vitamin D deficiency.   Objective: Vital Signs: BP 129/67   Pulse 75   Ht 5\' 2"  (1.575 m)   Wt 190 lb (86.2 kg)   BMI 34.75 kg/m   Physical Exam  Constitutional: She is oriented to person, place, and time. She appears well-developed.  HENT:  Head: Normocephalic.  Right Ear: External ear normal.  Left Ear: External ear normal.  Eyes: Pupils are equal, round, and reactive to light.  Neck: No tracheal deviation present. No thyromegaly present.  Cardiovascular: Normal rate.  Pulmonary/Chest: Effort normal.  Abdominal: Soft.  Neurological: She is alert and oriented to person, place, and time.  Skin: Skin is warm and dry.  Psychiatric: She has a normal mood and affect. Her behavior is normal.    Ortho Exam pulses are palpable negative straight leg raising 90 degrees no pain with internal/external rotation of her hips.  Exquisite tenderness on the right greater trochanter moderate on the left.  She is  amatory with a limp.  Sciatic notch palpation is mildly tender right and left well-healed lumbar incision L5S1 just to the right of midline. Specialty Comments:  No specialty comments available.  Imaging: No results found.   PMFS History: Patient Active Problem List   Diagnosis Date Noted  . Trochanteric bursitis, right hip 08/12/2018  . Post-menopausal 01/19/2018  . Current smoker 01/19/2018  . Mixed hyperlipidemia 07/13/2017  . Class 2 severe obesity due to excess calories with serious comorbidity and body mass index (BMI) of 37.0 to 37.9 in  adult ([place]) 07/13/2017  . Hypercortisolemia ([place]) 01/20/2017  . Excessive weight gain 01/20/2017  . Generalized abdominal pain 01/20/2017  . Uncontrolled type 2 diabetes mellitus with complication, with long-term current use of insulin ([place]) 12/20/2015  . Essential hypertension, benign 12/20/2015  . Vitamin D deficiency 12/20/2015  . Rectal bleeding 05/29/2015  . HNP (herniated nucleus pulposus), lumbar 07/12/2014    Class: Diagnosis of   Past Medical History:  Diagnosis Date  . Diabetes mellitus without complication (HCC)    Type 2 IDDM x 5 years  . Glaucoma   . Herniated lumbar intervertebral disc 06/2014  . PONV (postoperative nausea and vomiting)     Family History  Problem Relation Age of Onset  . Hypertension Mother   . Stroke Mother   . Cancer Father        brain tumor  . Diabetes Father     Past Surgical History:  Procedure Laterality Date  . CHOLECYSTECTOMY    . COLONOSCOPY N/A 06/28/2015   Procedure: COLONOSCOPY;  Surgeon: Rogene Houston, MD;  [facility];  Service: Endoscopy;  Laterality: N/A;  155  . EYE SURGERY    . LUMBAR LAMINECTOMY Right 07/12/2014   Procedure: LUMBAR FIVE TO SACRAL ONE MICRODISCECTOMY;  Surgeon: Marybelle Killings, MD;  Location: [facility];  Service: Orthopedics;  Laterality: Right;  . TUBAL LIGATION    . VITRECTOMY 25 GAUGE WITH SCLERAL BUCKLE Left 07/29/2017   Procedure: RETINAL DETACHMENT REPAIR LEFT EYE WITH PARSPLANA VITRECTOMY, AIR FLUID EXCHANGE, ENDO LASTER, DRAINAGE OF SUBRETINAL FLUID,ENDOLASER PPV /25 GAUGE;  Surgeon: Jalene Mullet, MD;  Location: [facility];  Service: Ophthalmology;  Laterality: Left;   Social History   Occupational History  . Not on file  Tobacco Use  . Smoking status: Current Some Day Smoker    Packs/day: 1.00    Years: 10.00    Pack years: 10.00    Types: Cigarettes  . Smokeless tobacco: Never Used  . Tobacco comment:  1/2 pack a day since age 63  Substance and Sexual Activity  . Alcohol use: No     Alcohol/week: 0.0 standard drinks  . Drug use: No  . Sexual activity: Yes

## 2018-11-30 DIAGNOSIS — E559 Vitamin D deficiency, unspecified: Secondary | ICD-10-CM | POA: Diagnosis not present

## 2018-11-30 DIAGNOSIS — Z6837 Body mass index (BMI) 37.0-37.9, adult: Secondary | ICD-10-CM | POA: Diagnosis not present

## 2018-11-30 DIAGNOSIS — F39 Unspecified mood [affective] disorder: Secondary | ICD-10-CM | POA: Diagnosis not present

## 2018-11-30 DIAGNOSIS — H4010X0 Unspecified open-angle glaucoma, stage unspecified: Secondary | ICD-10-CM | POA: Diagnosis not present

## 2018-11-30 DIAGNOSIS — H6063 Unspecified chronic otitis externa, bilateral: Secondary | ICD-10-CM | POA: Diagnosis not present

## 2018-11-30 DIAGNOSIS — L309 Dermatitis, unspecified: Secondary | ICD-10-CM | POA: Diagnosis not present

## 2018-11-30 DIAGNOSIS — H401190 Primary open-angle glaucoma, unspecified eye, stage unspecified: Secondary | ICD-10-CM | POA: Diagnosis not present

## 2018-11-30 DIAGNOSIS — E782 Mixed hyperlipidemia: Secondary | ICD-10-CM | POA: Diagnosis not present

## 2018-11-30 DIAGNOSIS — I10 Essential (primary) hypertension: Secondary | ICD-10-CM | POA: Diagnosis not present

## 2018-11-30 DIAGNOSIS — H3530 Unspecified macular degeneration: Secondary | ICD-10-CM | POA: Diagnosis not present

## 2018-11-30 DIAGNOSIS — E1165 Type 2 diabetes mellitus with hyperglycemia: Secondary | ICD-10-CM | POA: Diagnosis not present

## 2018-12-16 DIAGNOSIS — L03111 Cellulitis of right axilla: Secondary | ICD-10-CM | POA: Diagnosis not present

## 2018-12-16 DIAGNOSIS — E119 Type 2 diabetes mellitus without complications: Secondary | ICD-10-CM | POA: Diagnosis not present

## 2018-12-17 ENCOUNTER — Encounter (HOSPITAL_COMMUNITY): Payer: Self-pay

## 2018-12-17 ENCOUNTER — Other Ambulatory Visit: Payer: Self-pay

## 2018-12-17 ENCOUNTER — Inpatient Hospital Stay (HOSPITAL_COMMUNITY)
Admission: EM | Admit: 2018-12-17 | Discharge: 2018-12-23 | DRG: 603 | Disposition: A | Discharge status: Home / Self Care | Payer: Medicare Other | Attending: Internal Medicine | Admitting: Internal Medicine

## 2018-12-17 DIAGNOSIS — E119 Type 2 diabetes mellitus without complications: Secondary | ICD-10-CM

## 2018-12-17 DIAGNOSIS — N179 Acute kidney failure, unspecified: Secondary | ICD-10-CM

## 2018-12-17 DIAGNOSIS — I10 Essential (primary) hypertension: Secondary | ICD-10-CM | POA: Diagnosis present

## 2018-12-17 DIAGNOSIS — E1165 Type 2 diabetes mellitus with hyperglycemia: Secondary | ICD-10-CM | POA: Diagnosis present

## 2018-12-17 DIAGNOSIS — D649 Anemia, unspecified: Secondary | ICD-10-CM | POA: Diagnosis present

## 2018-12-17 DIAGNOSIS — E11628 Type 2 diabetes mellitus with other skin complications: Secondary | ICD-10-CM | POA: Diagnosis not present

## 2018-12-17 DIAGNOSIS — Z79899 Other long term (current) drug therapy: Secondary | ICD-10-CM

## 2018-12-17 DIAGNOSIS — F17210 Nicotine dependence, cigarettes, uncomplicated: Secondary | ICD-10-CM | POA: Diagnosis present

## 2018-12-17 DIAGNOSIS — E782 Mixed hyperlipidemia: Secondary | ICD-10-CM | POA: Diagnosis present

## 2018-12-17 DIAGNOSIS — L0390 Cellulitis, unspecified: Secondary | ICD-10-CM

## 2018-12-17 DIAGNOSIS — F419 Anxiety disorder, unspecified: Secondary | ICD-10-CM | POA: Diagnosis present

## 2018-12-17 DIAGNOSIS — F32A Depression, unspecified: Secondary | ICD-10-CM

## 2018-12-17 DIAGNOSIS — Z833 Family history of diabetes mellitus: Secondary | ICD-10-CM | POA: Diagnosis not present

## 2018-12-17 DIAGNOSIS — L02411 Cutaneous abscess of right axilla: Secondary | ICD-10-CM

## 2018-12-17 DIAGNOSIS — Z8249 Family history of ischemic heart disease and other diseases of the circulatory system: Secondary | ICD-10-CM

## 2018-12-17 DIAGNOSIS — L03119 Cellulitis of unspecified part of limb: Secondary | ICD-10-CM

## 2018-12-17 DIAGNOSIS — N6489 Other specified disorders of breast: Secondary | ICD-10-CM | POA: Diagnosis not present

## 2018-12-17 DIAGNOSIS — M79621 Pain in right upper arm: Secondary | ICD-10-CM | POA: Diagnosis not present

## 2018-12-17 DIAGNOSIS — Z794 Long term (current) use of insulin: Secondary | ICD-10-CM | POA: Diagnosis not present

## 2018-12-17 DIAGNOSIS — F329 Major depressive disorder, single episode, unspecified: Secondary | ICD-10-CM | POA: Diagnosis present

## 2018-12-17 DIAGNOSIS — Z7982 Long term (current) use of aspirin: Secondary | ICD-10-CM

## 2018-12-17 DIAGNOSIS — R0602 Shortness of breath: Secondary | ICD-10-CM | POA: Diagnosis not present

## 2018-12-17 DIAGNOSIS — E860 Dehydration: Secondary | ICD-10-CM | POA: Diagnosis present

## 2018-12-17 DIAGNOSIS — L03111 Cellulitis of right axilla: Secondary | ICD-10-CM | POA: Diagnosis present

## 2018-12-17 DIAGNOSIS — R1010 Upper abdominal pain, unspecified: Secondary | ICD-10-CM | POA: Diagnosis not present

## 2018-12-17 LAB — BASIC METABOLIC PANEL
Anion gap: 7 (ref 5–15)
BUN: 24 mg/dL — ABNORMAL HIGH (ref 8–23)
CHLORIDE: 104 mmol/L (ref 98–111)
CO2: 24 mmol/L (ref 22–32)
Calcium: 8.9 mg/dL (ref 8.9–10.3)
Creatinine, Ser: 2.26 mg/dL — ABNORMAL HIGH (ref 0.44–1.00)
GFR calc Af Amer: 25 mL/min — ABNORMAL LOW (ref >60)
GFR calc non Af Amer: 21 mL/min — ABNORMAL LOW (ref >60)
Glucose, Bld: 129 mg/dL — ABNORMAL HIGH (ref 70–99)
POTASSIUM: 3.5 mmol/L (ref 3.5–5.1)
Sodium: 135 mmol/L (ref 135–145)

## 2018-12-17 LAB — CBC WITH DIFFERENTIAL/PLATELET
ABS IMMATURE GRANULOCYTES: 0.07 10*3/uL (ref 0.00–0.07)
Basophils Absolute: 0 10*3/uL (ref 0.0–0.1)
Basophils Relative: 0 %
Eosinophils Absolute: 0 10*3/uL (ref 0.0–0.5)
Eosinophils Relative: 0 %
HCT: 34.5 % — ABNORMAL LOW (ref 36.0–46.0)
Hemoglobin: 11.2 g/dL — ABNORMAL LOW (ref 12.0–15.0)
Immature Granulocytes: 1 %
LYMPHS ABS: 1.2 10*3/uL (ref 0.7–4.0)
LYMPHS PCT: 15 %
MCH: 31.1 pg (ref 26.0–34.0)
MCHC: 32.5 g/dL (ref 30.0–36.0)
MCV: 95.8 fL (ref 80.0–100.0)
MONOS PCT: 10 %
Monocytes Absolute: 0.8 10*3/uL (ref 0.1–1.0)
Neutro Abs: 6.3 10*3/uL (ref 1.7–7.7)
Neutrophils Relative %: 74 %
Platelets: 160 10*3/uL (ref 150–400)
RBC: 3.6 MIL/uL — ABNORMAL LOW (ref 3.87–5.11)
RDW: 15.1 % (ref 11.5–15.5)
WBC: 8.5 10*3/uL (ref 4.0–10.5)
nRBC: 0 % (ref 0.0–0.2)

## 2018-12-17 LAB — LACTIC ACID, PLASMA: Lactic Acid, Venous: 1.4 mmol/L (ref 0.5–1.9)

## 2018-12-17 LAB — GLUCOSE, CAPILLARY: Glucose-Capillary: 227 mg/dL — ABNORMAL HIGH (ref 70–99)

## 2018-12-17 LAB — CBG MONITORING, ED: Glucose-Capillary: 202 mg/dL — ABNORMAL HIGH (ref 70–99)

## 2018-12-17 MED ORDER — LATANOPROST 0.005 % OP SOLN
1.0000 [drp] | Freq: Every day | OPHTHALMIC | Status: DC
  Administered 2018-12-18 – 2018-12-22 (×5): 1 [drp] via OPHTHALMIC
  Filled 2018-12-17 (×2): qty 2.5

## 2018-12-17 MED ORDER — SODIUM CHLORIDE 0.9 % IV BOLUS
1000.0000 mL | Freq: Once | INTRAVENOUS | Status: AC
  Administered 2018-12-17: 1000 mL via INTRAVENOUS

## 2018-12-17 MED ORDER — MORPHINE SULFATE (PF) 4 MG/ML IV SOLN
4.0000 mg | Freq: Once | INTRAVENOUS | Status: AC
  Administered 2018-12-17: 4 mg via INTRAVENOUS
  Filled 2018-12-17: qty 1

## 2018-12-17 MED ORDER — ACETAMINOPHEN 325 MG PO TABS
650.0000 mg | ORAL_TABLET | Freq: Four times a day (QID) | ORAL | Status: DC | PRN
  Administered 2018-12-18 – 2018-12-21 (×4): 650 mg via ORAL
  Filled 2018-12-17 (×5): qty 2

## 2018-12-17 MED ORDER — SODIUM CHLORIDE 0.9 % IV SOLN
INTRAVENOUS | Status: AC
  Administered 2018-12-17 – 2018-12-18 (×2): via INTRAVENOUS

## 2018-12-17 MED ORDER — CLINDAMYCIN PHOSPHATE 900 MG/50ML IV SOLN
900.0000 mg | Freq: Once | INTRAVENOUS | Status: AC
  Administered 2018-12-17: 900 mg via INTRAVENOUS
  Filled 2018-12-17: qty 50

## 2018-12-17 MED ORDER — ATORVASTATIN CALCIUM 10 MG PO TABS
10.0000 mg | ORAL_TABLET | Freq: Every day | ORAL | Status: DC
  Administered 2018-12-17 – 2018-12-22 (×6): 10 mg via ORAL
  Filled 2018-12-17 (×7): qty 1

## 2018-12-17 MED ORDER — ENOXAPARIN SODIUM 30 MG/0.3ML ~~LOC~~ SOLN
30.0000 mg | SUBCUTANEOUS | Status: DC
  Administered 2018-12-18 – 2018-12-22 (×5): 30 mg via SUBCUTANEOUS
  Filled 2018-12-17 (×6): qty 0.3

## 2018-12-17 MED ORDER — HYDROCODONE-ACETAMINOPHEN 5-325 MG PO TABS
1.0000 | ORAL_TABLET | ORAL | Status: DC | PRN
  Administered 2018-12-17 – 2018-12-20 (×3): 2 via ORAL
  Administered 2018-12-22 – 2018-12-23 (×2): 1 via ORAL
  Filled 2018-12-17: qty 1
  Filled 2018-12-17 (×3): qty 2
  Filled 2018-12-17: qty 1

## 2018-12-17 MED ORDER — ONDANSETRON HCL 4 MG/2ML IJ SOLN
4.0000 mg | Freq: Once | INTRAMUSCULAR | Status: AC
  Administered 2018-12-17: 4 mg via INTRAVENOUS
  Filled 2018-12-17: qty 2

## 2018-12-17 MED ORDER — PAROXETINE HCL 10 MG PO TABS
30.0000 mg | ORAL_TABLET | Freq: Every morning | ORAL | Status: DC
  Administered 2018-12-18 – 2018-12-23 (×6): 30 mg via ORAL
  Filled 2018-12-17 (×6): qty 3

## 2018-12-17 MED ORDER — INSULIN ASPART 100 UNIT/ML ~~LOC~~ SOLN
0.0000 [IU] | Freq: Three times a day (TID) | SUBCUTANEOUS | Status: DC
  Administered 2018-12-18: 8 [IU] via SUBCUTANEOUS
  Administered 2018-12-18: 5 [IU] via SUBCUTANEOUS
  Administered 2018-12-18: 8 [IU] via SUBCUTANEOUS
  Administered 2018-12-19: 5 [IU] via SUBCUTANEOUS
  Administered 2018-12-19: 8 [IU] via SUBCUTANEOUS
  Administered 2018-12-19: 11 [IU] via SUBCUTANEOUS
  Administered 2018-12-20: 3 [IU] via SUBCUTANEOUS
  Administered 2018-12-20 (×2): 5 [IU] via SUBCUTANEOUS
  Administered 2018-12-21: 3 [IU] via SUBCUTANEOUS
  Administered 2018-12-21: 2 [IU] via SUBCUTANEOUS
  Administered 2018-12-21: 8 [IU] via SUBCUTANEOUS
  Administered 2018-12-22: 5 [IU] via SUBCUTANEOUS
  Administered 2018-12-22: 8 [IU] via SUBCUTANEOUS

## 2018-12-17 MED ORDER — VANCOMYCIN HCL 10 G IV SOLR
2000.0000 mg | Freq: Once | INTRAVENOUS | Status: AC
  Administered 2018-12-18: 2000 mg via INTRAVENOUS
  Filled 2018-12-17 (×2): qty 2000

## 2018-12-17 MED ORDER — ACETAMINOPHEN 650 MG RE SUPP: 650.0000 mg | Freq: Four times a day (QID) | RECTAL | Status: DC | PRN

## 2018-12-17 NOTE — H&P (Signed)
History and Physical    Vicki Moon NFA:[id number] DOB: 1951/01/15 DOA: 12/17/2018  PCP: [name], MD Patient coming from: Home  Chief Complaint: Right axillary pain and swelling  HPI: Vicki Moon is a 67 y.o. female with medical history significant of type 2 diabetes, hyperlipidemia, depression presenting to the hospital for evaluation of right axillary pain and swelling.  Patient states she noticed a small pimple which she thought was due to an ingrown hair about 4 days ago below her right axillary region.  States it kept on enlarging in size and became very painful.  Reports having fevers and chills.  States she went to her PCP yesterday and was given an antibiotic which she has been taking since then.  She continued to have significant pain and decided to come into the ED today.  She takes NovoLog sliding scale insulin for her diabetes.  States her last A1c was 8 and her blood glucose ranges between 100s to 300s at home.  She been having a runny nose, postnasal drip, and a cough over the past 1 week.  Reports having decreased p.o. intake.  Denies having any abdominal pain, nausea, or vomiting.  She continues to take lisinopril daily.   ED Course: Afebrile and hemodynamically stable.  No leukocytosis.  Lactic acid normal.  BUN 24.  Creatinine 2.2, baseline 0.4-0.5 a year ago.  Per ED provider, bedside ultrasound was done and did not show any obvious significant drainable pus pocket.  Review of Systems: As per HPI otherwise 10 point review of systems negative.  Past Medical History:  Diagnosis Date  . Diabetes mellitus without complication (HCC)    Type 2 IDDM x 5 years  . Glaucoma   . Herniated lumbar intervertebral disc 06/2014  . PONV (postoperative nausea and vomiting)     Past Surgical History:  Procedure Laterality Date  . CHOLECYSTECTOMY    . COLONOSCOPY N/A 06/28/2015   Procedure: COLONOSCOPY;  Surgeon: Rogene Houston, MD;  [facility];  Service:  Endoscopy;  Laterality: N/A;  155  . EYE SURGERY    . LUMBAR LAMINECTOMY Right 07/12/2014   Procedure: LUMBAR FIVE TO SACRAL ONE MICRODISCECTOMY;  Surgeon: Marybelle Killings, MD;  Location: [facility];  Service: Orthopedics;  Laterality: Right;  . TUBAL LIGATION    . VITRECTOMY 25 GAUGE WITH SCLERAL BUCKLE Left 07/29/2017   Procedure: RETINAL DETACHMENT REPAIR LEFT EYE WITH PARSPLANA VITRECTOMY, AIR FLUID EXCHANGE, ENDO LASTER, DRAINAGE OF SUBRETINAL FLUID,ENDOLASER PPV /25 GAUGE;  Surgeon: Jalene Mullet, MD;  Location: [facility];  Service: Ophthalmology;  Laterality: Left;     reports that she has been smoking cigarettes. She has a 10.00 pack-year smoking history. She has never used smokeless tobacco. She reports that she does not drink alcohol or use drugs.  Allergies  Allergen Reactions  . Metformin Nausea Only    "severe GI"    Family History  Problem Relation Age of Onset  . Hypertension Mother   . Stroke Mother   . Cancer Father        brain tumor  . Diabetes Father     Prior to Admission medications   Medication Sig Start Date End Date Taking? Authorizing Provider  atorvastatin (LIPITOR) 10 MG tablet Take 10 mg by mouth daily. 08/17/18  Yes [provider]  B-D ULTRAFINE III SHORT PEN 31G X 8 MM MISC USE ONE PEN NEEDLE 4 TIMES DAILY 11/19/17  Yes Nida, Marella Chimes, MD  Continuous Blood Gluc Sensor (  FREESTYLE LIBRE SENSOR SYSTEM) MISC Use one sensor every 10 days. 10/14/17  Yes Nida, Marella Chimes, MD  insulin lispro (HUMALOG KWIKPEN) 100 UNIT/ML KiwkPen Inject 0.22-0.28 mLs (22-28 Units total) into the skin 3 (three) times daily with meals. 01/19/18  Yes Nida, Marella Chimes, MD  latanoprost (XALATAN) 0.005 % ophthalmic solution Place 1 drop into both eyes at bedtime.   Yes [provider]  lisinopril (PRINIVIL,ZESTRIL) 10 MG tablet TAKE 1 TABLET BY MOUTH ONCE DAILY 04/29/17  Yes Nida, Marella Chimes, MD  Multiple Vitamins-Minerals (PRESERVISION AREDS PO) Take by mouth  2 (two) times daily.   Yes [provider]  ONETOUCH VERIO test strip USE 1 STRIP TO CHECK GLUCOSE 4 TIMES DAILY 09/29/17  Yes Nida, Marella Chimes, MD  PARoxetine (PAXIL) 30 MG tablet Take 30 mg by mouth every morning. 07/12/18  Yes [provider]  Vitamin D, Ergocalciferol, (DRISDOL) 50000 units CAPS capsule Take 50,000 Units by mouth once a week. Mondays 08/17/18  Yes [provider]    Physical Exam: Vitals:   12/17/18 1500 12/17/18 1757 12/17/18 1832 12/17/18 2051  BP: 117/80 105/66 (!) 130/57 (!) 129/55  Pulse: 88 91 85 88  Resp: 16 16 16 18   Temp: 98 F (36.7 C)   98.4 F (36.9 C)  TempSrc: Oral   Oral  SpO2: 100% 95% 98% 97%  Weight: 93 kg     Height: 5\' 2"  (1.575 m)       Physical Exam  Constitutional: She is oriented to person, place, and time. She appears well-developed and well-nourished.  HENT:  Head: Normocephalic.  Mouth/Throat: Oropharynx is clear and moist.  Eyes: Right eye exhibits no discharge. Left eye exhibits no discharge.  Neck: Neck supple.  Cardiovascular: Normal rate, regular rhythm and intact distal pulses.  Pulmonary/Chest: Effort normal and breath sounds normal. No respiratory distress. She has no wheezes. She has no rales.  Abdominal: Soft. Bowel sounds are normal. She exhibits no distension. There is no abdominal tenderness. There is no guarding.  Musculoskeletal:        General: No edema.  Neurological: She is alert and oriented to person, place, and time.  Skin: Skin is warm and dry. She is not diaphoretic.  A large area of erythema and induration noted in the area of the right axilla.  Erythema extends to her right lateral breast and right upper chest wall.  A few small intact clear fluid-filled vesicles noted on a small area of erythema at the right upper breast.  Please see image.  Psychiatric: She has a normal mood and affect. Her behavior is normal.       Labs on Admission: I have personally reviewed following  labs and imaging studies  CBC: Recent Labs  Lab 12/17/18 1636  WBC 8.5  NEUTROABS 6.3  HGB 11.2*  HCT 34.5*  MCV 95.8  PLT 160   Basic Metabolic Panel: Recent Labs  Lab 12/17/18 1636  NA 135  K 3.5  CL 104  CO2 24  GLUCOSE 129*  BUN 24*  CREATININE 2.26*  CALCIUM 8.9   GFR: Estimated Creatinine Clearance: 25 mL/min (A) (by C-G formula based on SCr of 2.26 mg/dL (H)). Liver Function Tests: No results for input(s): AST, ALT, ALKPHOS, BILITOT, PROT, ALBUMIN in the last 168 hours. No results for input(s): LIPASE, AMYLASE in the last 168 hours. No results for input(s): AMMONIA in the last 168 hours. Coagulation Profile: No results for input(s): INR, PROTIME in the last 168 hours. Cardiac Enzymes:  No results for input(s): CKTOTAL, CKMB, CKMBINDEX, TROPONINI in the last 168 hours. BNP (last 3 results) No results for input(s): PROBNP in the last 8760 hours. HbA1C: No results for input(s): HGBA1C in the last 72 hours. CBG: Recent Labs  Lab 12/17/18 2015 12/17/18 2108  GLUCAP 202* 227*   Lipid Profile: No results for input(s): CHOL, HDL, LDLCALC, TRIG, CHOLHDL, LDLDIRECT in the last 72 hours. Thyroid Function Tests: No results for input(s): TSH, T4TOTAL, FREET4, T3FREE, THYROIDAB in the last 72 hours. Anemia Panel: No results for input(s): VITAMINB12, FOLATE, FERRITIN, TIBC, IRON, RETICCTPCT in the last 72 hours. Urine analysis:    Component Value Date/Time   COLORURINE YELLOW 07/11/2014 1440   APPEARANCEUR HAZY (A) 07/11/2014 1440   LABSPEC 1.017 07/11/2014 1440   PHURINE 5.5 07/11/2014 1440   GLUCOSEU NEGATIVE 07/11/2014 1440   HGBUR NEGATIVE 07/11/2014 1440   BILIRUBINUR NEGATIVE 07/11/2014 1440   KETONESUR NEGATIVE 07/11/2014 1440   PROTEINUR NEGATIVE 07/11/2014 1440   UROBILINOGEN 0.2 07/11/2014 1440   NITRITE NEGATIVE 07/11/2014 1440   LEUKOCYTESUR NEGATIVE 07/11/2014 1440    Radiological Exams on Admission: No results  found.  Assessment/Plan Principal Problem:   Cellulitis Active Problems:   Mixed hyperlipidemia   AKI (acute kidney injury) ([place])   Anemia   Type 2 diabetes mellitus (HCC)   Depression   Cellulitis of right axilla Afebrile and hemodynamically stable.  No leukocytosis.  Lactic acid normal.  Please see image above.  Bedside ultrasound was done by ED provider and did not show any obvious significant drainable pus pocket. -Received a dose of IV clindamycin in the ED.  Will switch to IV vancomycin. -Norco 5-325 mg 1 to 2 tablets every 4 hours as needed for pain, Tylenol as needed -Blood culture x2 ordered in the ED pending  AKI Likely due to dehydration from decreased p.o. intake in the setting of recent viral URI and continued lisinopril use. BUN 24.  Creatinine 2.2, baseline 0.4-0.5 a year ago. -IV fluid hydration -Renal ultrasound -Avoid nephrotoxic agents/contrast -Repeat BMP in a.m.  Anemia -Hemoglobin 11.2, was normal a year ago.  No recent baseline.  No signs of active bleeding. -Check iron, ferritin, TIBC -Patient will need outpatient follow-up with PCP.  No colonoscopy results in the chart.  Type 2 diabetes Blood glucose 129 on admission. -Check A1c -Sliding scale insulin moderate -CBG checks  Hyperlipidemia -Continue home Lipitor  Depression -Continue Paxil  DVT prophylaxis: Lovenox (renally dosed) Code Status: Patient wishes to be full code. Family Communication: No family available. Disposition Plan: Anticipate discharge after clinical improvement. Consults called: None Admission status: It is my clinical opinion that admission to INPATIENT is reasonable and necessary in this 67 y.o. female . presenting with symptoms of right axilla pain, swelling, and erythema, concerning for cellulitis . in the context of PMH including: Type 2 diabetes . with pertinent positives on physical exam including: Erythema, induration, pain (see image above) . and pertinent  positives on radiographic and laboratory data including: Evidence of AKI . Workup and treatment include IV antibiotic and IV fluid hydration.  Pain management.  Given the aforementioned, the predictability of an adverse outcome is felt to be significant. I expect that the patient will require at least 2 midnights in the hospital to treat this condition.    Shela Leff MD [facility] Pager [phone]  If 7PM-7AM, please contact night-coverage [email or web address] Password [facility]  12/17/2018, 11:22 PM

## 2018-12-17 NOTE — ED Notes (Signed)
Attempted to give report to [place]. Was yelled at and hung up on.

## 2018-12-17 NOTE — ED Triage Notes (Signed)
Pt has abscess under right axilla area. Pt seen by Dr Nevada Crane yesterday and was given antibiotics and has gotten worse since then. Also noted to have to blisters to breast below area

## 2018-12-17 NOTE — ED Provider Notes (Signed)
[facility] EMERGENCY DEPARTMENT Provider Note   CSN: [id number] Arrival date & time: 12/17/18  1443     History   Chief Complaint Chief Complaint  Patient presents with  . Abscess    HPI Vicki Moon is a 67 y.o. female with a history of diabetes, hypertension, hypercholesterolemia presenting with a 5-day history of right axillary pain and swelling.  She was seen by her PCP yesterday and diagnosed with an abscess and was placed on Keflex.  Over the past 24 hours her pain is worsened, including spreading redness across her right breast and across her upper chest to her shoulder area.  She has had subjective fevers including shaking chills followed by cold sweats and has had nausea without emesis and reduced appetite.  She has had increased thirst and endorses her blood sugars have been uncontrolled.  Her last CBG check this morning was over 300.  She endorses generalized weakness and fatigue.  There is been no drainage from the abscess site.  The history is provided by the patient.    Past Medical History:  Diagnosis Date  . Diabetes mellitus without complication (HCC)    Type 2 IDDM x 5 years  . Glaucoma   . Herniated lumbar intervertebral disc 06/2014  . PONV (postoperative nausea and vomiting)     Patient Active Problem List   Diagnosis Date Noted  . Trochanteric bursitis, right hip 08/12/2018  . Post-menopausal 01/19/2018  . Current smoker 01/19/2018  . Mixed hyperlipidemia 07/13/2017  . Class 2 severe obesity due to excess calories with serious comorbidity and body mass index (BMI) of 37.0 to 37.9 in adult ([place]) 07/13/2017  . Hypercortisolemia ([place]) 01/20/2017  . Excessive weight gain 01/20/2017  . Generalized abdominal pain 01/20/2017  . Uncontrolled type 2 diabetes mellitus with complication, with long-term current use of insulin ([place]) 12/20/2015  . Essential hypertension, benign 12/20/2015  . Vitamin D deficiency 12/20/2015  . Rectal bleeding 05/29/2015  .  HNP (herniated nucleus pulposus), lumbar 07/12/2014    Class: Diagnosis of    Past Surgical History:  Procedure Laterality Date  . CHOLECYSTECTOMY    . COLONOSCOPY N/A 06/28/2015   Procedure: COLONOSCOPY;  Surgeon: Rogene Houston, MD;  [facility];  Service: Endoscopy;  Laterality: N/A;  155  . EYE SURGERY    . LUMBAR LAMINECTOMY Right 07/12/2014   Procedure: LUMBAR FIVE TO SACRAL ONE MICRODISCECTOMY;  Surgeon: Marybelle Killings, MD;  Location: [facility];  Service: Orthopedics;  Laterality: Right;  . TUBAL LIGATION    . VITRECTOMY 25 GAUGE WITH SCLERAL BUCKLE Left 07/29/2017   Procedure: RETINAL DETACHMENT REPAIR LEFT EYE WITH PARSPLANA VITRECTOMY, AIR FLUID EXCHANGE, ENDO LASTER, DRAINAGE OF SUBRETINAL FLUID,ENDOLASER PPV /25 GAUGE;  Surgeon: Jalene Mullet, MD;  Location: [facility];  Service: Ophthalmology;  Laterality: Left;     OB History   No obstetric history on file.      Home Medications    Prior to Admission medications   Medication Sig Start Date End Date Taking? Authorizing Provider  aspirin 81 MG tablet Take 81 mg by mouth daily.    [provider]  atorvastatin (LIPITOR) 10 MG tablet Take 10 mg by mouth daily. 08/17/18   [provider]  B-D ULTRAFINE III SHORT PEN 31G X 8 MM MISC USE ONE PEN NEEDLE 4 TIMES DAILY 11/19/17   Cassandria Anger, MD  Continuous Blood Gluc Sensor (FREESTYLE LIBRE SENSOR SYSTEM) MISC Use one sensor every 10 days. 10/14/17  Cassandria Anger, MD  Insulin Disposable Pump (OMNIPOD DASH 5 PACK) MISC CHANGE Q 72 H 07/12/18   [provider]  Insulin Glargine (LANTUS SOLOSTAR [place]) Inject 80 Units into the skin at bedtime.    [provider]  insulin lispro (HUMALOG KWIKPEN) 100 UNIT/ML KiwkPen Inject 0.22-0.28 mLs (22-28 Units total) into the skin 3 (three) times daily with meals. 01/19/18   Cassandria Anger, MD  latanoprost (XALATAN) 0.005 % ophthalmic solution Place 1 drop into both eyes at bedtime.     [provider]  lisinopril (PRINIVIL,ZESTRIL) 10 MG tablet TAKE 1 TABLET BY MOUTH ONCE DAILY 04/29/17   Cassandria Anger, MD  losartan (COZAAR) 25 MG tablet Take 25 mg by mouth daily.    [provider]  Multiple Vitamins-Minerals (PRESERVISION AREDS PO) Take by mouth 2 (two) times daily.    [provider]  [facility] VERIO test strip USE 1 STRIP TO CHECK GLUCOSE 4 TIMES DAILY 09/29/17   Cassandria Anger, MD  PARoxetine (PAXIL) 30 MG tablet Take 30 mg by mouth every morning. 07/12/18   [provider]  Vitamin D, Ergocalciferol, (DRISDOL) 50000 units CAPS capsule Take 50,000 Units by mouth once a week. 08/17/18   [provider]    Family History Family History  Problem Relation Age of Onset  . Hypertension Mother   . Stroke Mother   . Cancer Father        brain tumor  . Diabetes Father     Social History Social History   Tobacco Use  . Smoking status: Current Some Day Smoker    Packs/day: 1.00    Years: 10.00    Pack years: 10.00    Types: Cigarettes  . Smokeless tobacco: Never Used  . Tobacco comment:  1/2 pack a day since age 7  Substance Use Topics  . Alcohol use: No    Alcohol/week: 0.0 standard drinks  . Drug use: No     Allergies   Metformin   Review of Systems Review of Systems  Constitutional: Positive for chills, fatigue and fever.  HENT: Negative for congestion and sore throat.   Eyes: Negative.   Respiratory: Negative for chest tightness and shortness of breath.   Cardiovascular: Negative for chest pain.  Gastrointestinal: Positive for nausea. Negative for abdominal pain and vomiting.  Endocrine: Positive for polydipsia.  Genitourinary: Negative.   Musculoskeletal: Negative for arthralgias, joint swelling and neck pain.  Skin: Positive for color change. Negative for rash and wound.  Neurological: Positive for weakness. Negative for dizziness, light-headedness, numbness and headaches.    Psychiatric/Behavioral: Negative.      Physical Exam Updated Vital Signs BP 117/80 (BP Location: Left Arm)   Pulse 88   Temp 98 F (36.7 C) (Oral)   Resp 16   Ht 5\' 2"  (1.575 m)   Wt 93 kg   SpO2 100%   BMI 37.49 kg/m   Physical Exam Vitals signs and nursing note reviewed.  Constitutional:      Appearance: Normal appearance. She is well-developed.  HENT:     Head: Normocephalic and atraumatic.  Eyes:     Conjunctiva/sclera: Conjunctivae normal.  Neck:     Musculoskeletal: Normal range of motion.  Cardiovascular:     Rate and Rhythm: Normal rate and regular rhythm.     Heart sounds: Normal heart sounds.  Pulmonary:     Effort: Pulmonary effort is normal.     Breath sounds: Normal breath sounds. No wheezing.  Abdominal:  General: Bowel sounds are normal.     Palpations: Abdomen is soft.     Tenderness: There is no abdominal tenderness.  Musculoskeletal: Normal range of motion.  Skin:    General: Skin is warm and dry.     Findings: Erythema present.     Comments: Patient has a large area of induration in her right axilla without any appreciable fluctuance.  No drainage.  There is erythema extending to her right lateral breast and tracking across her right upper chest wall.  There is a smaller patch of erythema at the right upper breast with several intact clear vesicles.  Neurological:     Mental Status: She is alert.      ED Treatments / Results  Labs (all labs ordered are listed, but only abnormal results are displayed) Labs Reviewed  CBC WITH DIFFERENTIAL/PLATELET - Abnormal; Notable for the following components:      Result Value   RBC 3.60 (*)    Hemoglobin 11.2 (*)    HCT 34.5 (*)    All other components within normal limits  BASIC METABOLIC PANEL - Abnormal; Notable for the following components:   Glucose, Bld 129 (*)    BUN 24 (*)    Creatinine, Ser 2.26 (*)    GFR calc non Af Amer 21 (*)    GFR calc Af Amer 25 (*)    All other components  within normal limits  CULTURE, BLOOD (ROUTINE X 2)  CULTURE, BLOOD (ROUTINE X 2)  LACTIC ACID, PLASMA    EKG None  Radiology No results found.  Informal bedside ultrasound was performed by me.  There was no obvious significant drainable pus pocket identified.  There are several small deep areas that may be small pockets of infection but nothing that has coalesced.  Procedures Procedures (including critical care time)  Medications Ordered in ED Medications  sodium chloride 0.9 % bolus 1,000 mL (has no administration in time range)  morphine 4 MG/ML injection 4 mg (4 mg Intravenous Given 12/17/18 1653)  ondansetron (ZOFRAN) injection 4 mg (4 mg Intravenous Given 12/17/18 1653)  clindamycin (CLEOCIN) IVPB 900 mg (900 mg Intravenous New Bag/Given 12/17/18 1656)     Initial Impression / Assessment and Plan / ED Course  I have reviewed the triage vital signs and the nursing notes.  Pertinent labs & imaging results that were available during my care of the patient were reviewed by me and considered in my medical decision making (see chart for details).     Labs reviewed and discussed with patient including acute renal injury.  Patient was given IV clindamycin here along with IV fluids.  She was given pain medication with significant improvement in her discomfort.  Patient will need admission for further IV antibiotics and management of her acute renal injury.   Final Clinical Impressions(s) / ED Diagnoses   Final diagnoses:  Cellulitis of axilla, right  Acute renal failure, unspecified acute renal failure type [facility])    ED Discharge Orders    None       Landis Martins 12/17/18 1817    Milton Ferguson, MD 12/18/18 [phone]

## 2018-12-17 NOTE — ED Notes (Signed)
Seen by Dr Nevada Crane yesterday for same   He rx'd antibiotics  Pt desires that it be drained

## 2018-12-18 ENCOUNTER — Inpatient Hospital Stay (HOSPITAL_COMMUNITY): Payer: Medicare Other

## 2018-12-18 DIAGNOSIS — E11628 Type 2 diabetes mellitus with other skin complications: Secondary | ICD-10-CM

## 2018-12-18 DIAGNOSIS — E782 Mixed hyperlipidemia: Secondary | ICD-10-CM

## 2018-12-18 DIAGNOSIS — F329 Major depressive disorder, single episode, unspecified: Secondary | ICD-10-CM

## 2018-12-18 DIAGNOSIS — N179 Acute kidney failure, unspecified: Secondary | ICD-10-CM

## 2018-12-18 LAB — FERRITIN: FERRITIN: 232 ng/mL (ref 11–307)

## 2018-12-18 LAB — BASIC METABOLIC PANEL
Anion gap: 7 (ref 5–15)
BUN: 24 mg/dL — AB (ref 8–23)
CO2: 20 mmol/L — ABNORMAL LOW (ref 22–32)
Calcium: 8 mg/dL — ABNORMAL LOW (ref 8.9–10.3)
Chloride: 109 mmol/L (ref 98–111)
Creatinine, Ser: 2.11 mg/dL — ABNORMAL HIGH (ref 0.44–1.00)
GFR calc Af Amer: 27 mL/min — ABNORMAL LOW (ref >60)
GFR, EST NON AFRICAN AMERICAN: 23 mL/min — AB (ref >60)
Glucose, Bld: 248 mg/dL — ABNORMAL HIGH (ref 70–99)
Potassium: 4.3 mmol/L (ref 3.5–5.1)
Sodium: 136 mmol/L (ref 135–145)

## 2018-12-18 LAB — GLUCOSE, CAPILLARY
Glucose-Capillary: 207 mg/dL — ABNORMAL HIGH (ref 70–99)
Glucose-Capillary: 262 mg/dL — ABNORMAL HIGH (ref 70–99)
Glucose-Capillary: 285 mg/dL — ABNORMAL HIGH (ref 70–99)
Glucose-Capillary: 228 mg/dL — ABNORMAL HIGH (ref 70–99)

## 2018-12-18 LAB — IRON AND TIBC
Iron: 38 ug/dL (ref 28–170)
Saturation Ratios: 18 % (ref 10.4–31.8)
TIBC: 213 ug/dL — ABNORMAL LOW (ref 250–450)
UIBC: 175 ug/dL

## 2018-12-18 LAB — HEMOGLOBIN A1C
Hgb A1c MFr Bld: 8.1 % — ABNORMAL HIGH (ref 4.8–5.6)
Mean Plasma Glucose: 185.77 mg/dL

## 2018-12-18 MED ORDER — SODIUM CHLORIDE 0.45 % IV SOLN
INTRAVENOUS | Status: DC
  Administered 2018-12-18 – 2018-12-20 (×4): via INTRAVENOUS

## 2018-12-18 MED ORDER — VANCOMYCIN HCL IN DEXTROSE 750-5 MG/150ML-% IV SOLN
750.0000 mg | INTRAVENOUS | Status: DC
  Administered 2018-12-19 – 2018-12-23 (×5): 750 mg via INTRAVENOUS
  Filled 2018-12-18 (×6): qty 150

## 2018-12-18 NOTE — Progress Notes (Signed)
PROGRESS NOTE    Vicki Moon  ZOX:[id number] DOB: [birth year] DOA: 12/17/2018 PCP: [name], MD    Brief Narrative:  67 year old female with a history of diabetes, hyperlipidemia, admitted to the hospital with right axillary pain and swelling.  Found to have significant cellulitis in her right axilla.  Started on intravenous antibiotics.   Assessment & Plan:   Principal Problem:   Cellulitis Active Problems:   Mixed hyperlipidemia   AKI (acute kidney injury) ([place])   Anemia   Type 2 diabetes mellitus ([place])   Depression   1. Cellulitis of right axilla.  Ultrasound was performed in the emergency room without any obvious drainable pus pocket.  She is feeling mildly better today.  Continues to have pain and swelling.  Will continue on vancomycin and monitor. 2. Acute kidney injury.  Renal ultrasound unrevealing.  Start on IV fluids and monitor urine output. 3. Diabetes.  Blood sugar stable.  Continue on sliding scale insulin. 4. Hyperlipidemia continue statin. 5. Depression.  Continue on Paxil.   DVT prophylaxis: Lovenox Code Status: Full code Family Communication: No family present Disposition Plan: Discharge home once cellulitis is improved   Consultants:     Procedures:     Antimicrobials:   Vancomycin 12/27 >   Subjective: She feels mildly better than she did yesterday.  Continues to have pain in right axilla, but able to move it more.  Still has significant erythema and swelling in this area.  Objective: Vitals:   12/17/18 1757 12/17/18 1832 12/17/18 2051 12/18/18 0542  BP: 105/66 (!) 130/57 (!) 129/55 107/88  Pulse: 91 85 88 74  Resp: 16 16 18 18   Temp:   98.4 F (36.9 C) 98.1 F (36.7 C)  TempSrc:   Oral Oral  SpO2: 95% 98% 97% 97%  Weight:   95.2 kg   Height:   5\' 2"  (1.575 m)     Intake/Output Summary (Last 24 hours) at 12/18/2018 1128 Last data filed at 12/18/2018 0400 Gross per 24 hour  Intake 2238.71 ml  Output -  Net 2238.71  ml   Filed Weights   12/17/18 1500 12/17/18 2051  Weight: 93 kg 95.2 kg    Examination:  General exam: Appears calm and comfortable  Respiratory system: Clear to auscultation. Respiratory effort normal. Cardiovascular system: S1 & S2 heard, RRR. No JVD, murmurs, rubs, gallops or clicks. No pedal edema. Gastrointestinal system: Abdomen is nondistended, soft and nontender. No organomegaly or masses felt. Normal bowel sounds heard. Central nervous system: Alert and oriented. No focal neurological deficits. Extremities: Symmetric 5 x 5 power. Skin: Large area of erythema and induration in right axilla.  No draining lesions.  There are a few bullous lesions in axilla/right chest Psychiatry: Judgement and insight appear normal. Mood & affect appropriate.     Data Reviewed: I have personally reviewed following labs and imaging studies  CBC: Recent Labs  Lab 12/17/18 1636  WBC 8.5  NEUTROABS 6.3  HGB 11.2*  HCT 34.5*  MCV 95.8  PLT 811   Basic Metabolic Panel: Recent Labs  Lab 12/17/18 1636 12/18/18 0543  NA 135 136  K 3.5 4.3  CL 104 109  CO2 24 20*  GLUCOSE 129* 248*  BUN 24* 24*  CREATININE 2.26* 2.11*  CALCIUM 8.9 8.0*   GFR: Estimated Creatinine Clearance: 27.1 mL/min (A) (by C-G formula based on SCr of 2.11 mg/dL (H)). Liver Function Tests: No results for input(s): AST, ALT, ALKPHOS, BILITOT, PROT, ALBUMIN in the last 168  hours. No results for input(s): LIPASE, AMYLASE in the last 168 hours. No results for input(s): AMMONIA in the last 168 hours. Coagulation Profile: No results for input(s): INR, PROTIME in the last 168 hours. Cardiac Enzymes: No results for input(s): CKTOTAL, CKMB, CKMBINDEX, TROPONINI in the last 168 hours. BNP (last 3 results) No results for input(s): PROBNP in the last 8760 hours. HbA1C: No results for input(s): HGBA1C in the last 72 hours. CBG: Recent Labs  Lab 12/17/18 2015 12/17/18 2108 12/18/18 0743  GLUCAP 202* 227* 207*    Lipid Profile: No results for input(s): CHOL, HDL, LDLCALC, TRIG, CHOLHDL, LDLDIRECT in the last 72 hours. Thyroid Function Tests: No results for input(s): TSH, T4TOTAL, FREET4, T3FREE, THYROIDAB in the last 72 hours. Anemia Panel: Recent Labs    12/18/18 0543  FERRITIN 232  TIBC 213*  IRON 38   Sepsis Labs: Recent Labs  Lab 12/17/18 1636  LATICACIDVEN 1.4    Recent Results (from the past 240 hour(s))  Blood culture (routine x 2)     Status: None (Preliminary result)   Collection Time: 12/17/18  4:36 PM  Result Value Ref Range Status   Specimen Description   Final    RIGHT ANTECUBITAL BOTTLES DRAWN AEROBIC AND ANAEROBIC   Special Requests Blood Culture adequate volume  Final   Culture   Final    NO GROWTH < 24 HOURS Performed at [facility], [address] [place], [place] [address]    Report Status PENDING  Incomplete  Blood culture (routine x 2)     Status: None (Preliminary result)   Collection Time: 12/17/18  4:38 PM  Result Value Ref Range Status   Specimen Description   Final    LEFT ANTECUBITAL BOTTLES DRAWN AEROBIC AND ANAEROBIC   Special Requests Blood Culture adequate volume  Final   Culture   Final    NO GROWTH < 24 HOURS Performed at [facility], [address], [place], [place] [address]    Report Status PENDING  Incomplete         Radiology Studies: US Renal  Result Date: 12/18/2018 CLINICAL DATA:  Acute renal insufficiency. EXAM: RENAL / URINARY TRACT ULTRASOUND COMPLETE COMPARISON:  CT abdomen and pelvis 02/16/2017, 06/01/2015. No prior ultrasound. FINDINGS: Right Kidney: Renal measurements: Approximately 13.3 x 6.1 x 7.2 cm = volume: 301 mL . No hydronephrosis. Well-preserved cortex. No shadowing calculi. Normal parenchymal echotexture. No focal parenchymal abnormality. Left Kidney: Renal measurements: Approximately 13.1 x 7.7 x 7.1 cm = volume: 375 mL. No hydronephrosis. Well-preserved cortex. No shadowing calculi. Normal parenchymal  echotexture. No focal parenchymal abnormality. Bladder: Decompressed and normal in appearance. IMPRESSION: Normal examination. Specifically, no evidence of urinary tract obstruction to explain the patient's renal insufficiency. Electronically Signed   By: Evangeline Dakin M.D.   On: 12/18/2018 10:18        Scheduled Meds: . atorvastatin  10 mg Oral q1800  . enoxaparin (LOVENOX) injection  30 mg Subcutaneous Q24H  . insulin aspart  0-15 Units Subcutaneous TID WC  . latanoprost  1 drop Both Eyes QHS  . PARoxetine  30 mg Oral q morning - 10a   Continuous Infusions: . [START ON 12/19/2018] vancomycin       LOS: 1 day    Time spent:80mins    Kathie Dike, MD [facility] Pager [phone]  If 7PM-7AM, please contact night-coverage [email or web address] Password [facility] 12/18/2018, 11:28 AM

## 2018-12-18 NOTE — Progress Notes (Addendum)
Pharmacy Antibiotic Note  Vicki Moon is a 67 y.o. female admitted on 12/17/2018 with cellulitis.  Pharmacy has been consulted for vancomycin dosing.  Plan:vancomycin 2gm iv x 1  Vancomycin 750mg  IV every 24 hours.  Goal trough 10-20 mcg/mL.  Height: 5\' 2"  (157.5 cm) Weight: 209 lb 14.1 oz (95.2 kg) IBW/kg (Calculated) : 50.1  Temp (24hrs), Avg:98.2 F (36.8 C), Min:98 F (36.7 C), Max:98.4 F (36.9 C)  Recent Labs  Lab 12/17/18 1636  WBC 8.5  CREATININE 2.26*  LATICACIDVEN 1.4    Estimated Creatinine Clearance: 25.3 mL/min (A) (by C-G formula based on SCr of 2.26 mg/dL (H)).    Allergies  Allergen Reactions  . Metformin Nausea Only    "severe GI"    Antimicrobials this admission: 12/28 vancomycin >> 12/28 clindamycin x 1   Microbiology results: 12/27 BCx: sent   Thank you for allowing pharmacy to be a part of this patient's care.  Donna Christen Kyleigh Nannini 12/18/2018 7:22 AM

## 2018-12-19 ENCOUNTER — Encounter (HOSPITAL_COMMUNITY): Payer: Self-pay | Admitting: Radiology

## 2018-12-19 ENCOUNTER — Inpatient Hospital Stay (HOSPITAL_COMMUNITY): Payer: Medicare Other

## 2018-12-19 LAB — BASIC METABOLIC PANEL
Anion gap: 6 (ref 5–15)
BUN: 24 mg/dL — ABNORMAL HIGH (ref 8–23)
CO2: 22 mmol/L (ref 22–32)
Calcium: 8.6 mg/dL — ABNORMAL LOW (ref 8.9–10.3)
Chloride: 109 mmol/L (ref 98–111)
Creatinine, Ser: 2.09 mg/dL — ABNORMAL HIGH (ref 0.44–1.00)
GFR calc Af Amer: 27 mL/min — ABNORMAL LOW (ref >60)
GFR calc non Af Amer: 24 mL/min — ABNORMAL LOW (ref >60)
Glucose, Bld: 247 mg/dL — ABNORMAL HIGH (ref 70–99)
Potassium: 4.2 mmol/L (ref 3.5–5.1)
Sodium: 137 mmol/L (ref 135–145)

## 2018-12-19 LAB — CBC
HEMATOCRIT: 31.5 % — AB (ref 36.0–46.0)
Hemoglobin: 10 g/dL — ABNORMAL LOW (ref 12.0–15.0)
MCH: 31.3 pg (ref 26.0–34.0)
MCHC: 31.7 g/dL (ref 30.0–36.0)
MCV: 98.7 fL (ref 80.0–100.0)
Platelets: 146 10*3/uL — ABNORMAL LOW (ref 150–400)
RBC: 3.19 MIL/uL — ABNORMAL LOW (ref 3.87–5.11)
RDW: 15.3 % (ref 11.5–15.5)
WBC: 6 10*3/uL (ref 4.0–10.5)
nRBC: 0 % (ref 0.0–0.2)

## 2018-12-19 LAB — GLUCOSE, CAPILLARY
GLUCOSE-CAPILLARY: 295 mg/dL — AB (ref 70–99)
Glucose-Capillary: 217 mg/dL — ABNORMAL HIGH (ref 70–99)
Glucose-Capillary: 289 mg/dL — ABNORMAL HIGH (ref 70–99)
Glucose-Capillary: 336 mg/dL — ABNORMAL HIGH (ref 70–99)
Glucose-Capillary: 196 mg/dL — ABNORMAL HIGH (ref 70–99)

## 2018-12-19 LAB — HIV ANTIBODY (ROUTINE TESTING W REFLEX): HIV Screen 4th Generation wRfx: NONREACTIVE

## 2018-12-19 MED ORDER — INSULIN GLARGINE 100 UNIT/ML ~~LOC~~ SOLN
15.0000 [IU] | Freq: Every day | SUBCUTANEOUS | Status: DC
  Administered 2018-12-19: 15 [IU] via SUBCUTANEOUS
  Filled 2018-12-19 (×2): qty 0.15

## 2018-12-19 NOTE — Progress Notes (Signed)
PROGRESS NOTE    KANIAH Moon  KVQ:[id number] DOB: June 09, 1951 DOA: 12/17/2018 PCP: [name], MD    Brief Narrative:  67 year old female with a history of diabetes, hyperlipidemia, admitted to the hospital with right axillary pain and swelling.  Found to have significant cellulitis in her right axilla.  Started on intravenous antibiotics.   Assessment & Plan:   Principal Problem:   Cellulitis Active Problems:   Mixed hyperlipidemia   AKI (acute kidney injury) ([place])   Anemia   Type 2 diabetes mellitus ([place])   Depression   1. Cellulitis of right axilla.  Continue on vancomycin.  Place warm compresses on area.  Reevaluate with ultrasound in a.m. to evaluate for developing pus pocket. 2. Acute kidney injury.  Possibly ATN.  Will order urinalysis.  Renal ultrasound unrevealing.  Overall creatinine appears to be stable. 3. Diabetes.  Blood sugars are elevated.  Continue on sliding scale insulin. 4. Hyperlipidemia continue statin. 5. Depression.  Continue on Paxil. 6. Shortness of breath.  Lungs are clear on exam.  Check chest x-ray   DVT prophylaxis: Lovenox Code Status: Full code Family Communication: No family present Disposition Plan: Discharge home once cellulitis is improved   Consultants:     Procedures:     Antimicrobials:   Vancomycin 12/27 >   Subjective: Feels a lot of pressure in right axilla.  Continues to have pain in this area.  Feels more short of breath today..  Objective: Vitals:   12/18/18 2110 12/18/18 2124 12/19/18 0521 12/19/18 1340  BP:  (!) 102/53 139/72 (!) 164/55  Pulse:  82 76 71  Resp:  18 16   Temp:  98.8 F (37.1 C) 98.7 F (37.1 C) 98.3 F (36.8 C)  TempSrc:  Oral Oral Oral  SpO2: 95% 98% 98% 97%  Weight:      Height:        Intake/Output Summary (Last 24 hours) at 12/19/2018 1705 Last data filed at 12/19/2018 0557 Gross per 24 hour  Intake 2468.4 ml  Output -  Net 2468.4 ml   Filed Weights   12/17/18 1500  12/17/18 2051  Weight: 93 kg 95.2 kg    Examination:  General exam: Appears calm and comfortable  Respiratory system: Clear to auscultation. Respiratory effort normal. Cardiovascular system: S1 & S2 heard, RRR. No JVD, murmurs, rubs, gallops or clicks. No pedal edema. Gastrointestinal system: Abdomen is nondistended, soft and nontender. No organomegaly or masses felt. Normal bowel sounds heard. Central nervous system: Alert and oriented. No focal neurological deficits. Extremities: Symmetric 5 x 5 power. Skin: Erythema in right axilla persists, but does not appear to be as intense.  Area still very indurated and tender to touch Psychiatry: Judgement and insight appear normal. Mood & affect appropriate.     Data Reviewed: I have personally reviewed following labs and imaging studies  CBC: Recent Labs  Lab 12/17/18 1636 12/19/18 0506  WBC 8.5 6.0  NEUTROABS 6.3  --   HGB 11.2* 10.0*  HCT 34.5* 31.5*  MCV 95.8 98.7  PLT 160 643*   Basic Metabolic Panel: Recent Labs  Lab 12/17/18 1636 12/18/18 0543 12/19/18 0506  NA 135 136 137  K 3.5 4.3 4.2  CL 104 109 109  CO2 24 20* 22  GLUCOSE 129* 248* 247*  BUN 24* 24* 24*  CREATININE 2.26* 2.11* 2.09*  CALCIUM 8.9 8.0* 8.6*   GFR: Estimated Creatinine Clearance: 27.3 mL/min (A) (by C-G formula based on SCr of 2.09 mg/dL (H)). Liver  Function Tests: No results for input(s): AST, ALT, ALKPHOS, BILITOT, PROT, ALBUMIN in the last 168 hours. No results for input(s): LIPASE, AMYLASE in the last 168 hours. No results for input(s): AMMONIA in the last 168 hours. Coagulation Profile: No results for input(s): INR, PROTIME in the last 168 hours. Cardiac Enzymes: No results for input(s): CKTOTAL, CKMB, CKMBINDEX, TROPONINI in the last 168 hours. BNP (last 3 results) No results for input(s): PROBNP in the last 8760 hours. HbA1C: Recent Labs    12/18/18 0543  HGBA1C 8.1*   CBG: Recent Labs  Lab 12/18/18 2152 12/19/18 0738  12/19/18 1144 12/19/18 1411 12/19/18 1614  GLUCAP 228* 217* 289* 336* 295*   Lipid Profile: No results for input(s): CHOL, HDL, LDLCALC, TRIG, CHOLHDL, LDLDIRECT in the last 72 hours. Thyroid Function Tests: No results for input(s): TSH, T4TOTAL, FREET4, T3FREE, THYROIDAB in the last 72 hours. Anemia Panel: Recent Labs    12/18/18 0543  FERRITIN 232  TIBC 213*  IRON 38   Sepsis Labs: Recent Labs  Lab 12/17/18 1636  LATICACIDVEN 1.4    Recent Results (from the past 240 hour(s))  Blood culture (routine x 2)     Status: None (Preliminary result)   Collection Time: 12/17/18  4:36 PM  Result Value Ref Range Status   Specimen Description   Final    RIGHT ANTECUBITAL BOTTLES DRAWN AEROBIC AND ANAEROBIC   Special Requests Blood Culture adequate volume  Final   Culture   Final    NO GROWTH 2 DAYS Performed at [facility], [address], [place], [place] [address]    Report Status PENDING  Incomplete  Blood culture (routine x 2)     Status: None (Preliminary result)   Collection Time: 12/17/18  4:38 PM  Result Value Ref Range Status   Specimen Description   Final    LEFT ANTECUBITAL BOTTLES DRAWN AEROBIC AND ANAEROBIC   Special Requests Blood Culture adequate volume  Final   Culture   Final    NO GROWTH 2 DAYS Performed at [facility], [address], [place], [place] [address]    Report Status PENDING  Incomplete         Radiology Studies: US Renal  Result Date: 12/18/2018 CLINICAL DATA:  Acute renal insufficiency. EXAM: RENAL / URINARY TRACT ULTRASOUND COMPLETE COMPARISON:  CT abdomen and pelvis 02/16/2017, 06/01/2015. No prior ultrasound. FINDINGS: Right Kidney: Renal measurements: Approximately 13.3 x 6.1 x 7.2 cm = volume: 301 mL . No hydronephrosis. Well-preserved cortex. No shadowing calculi. Normal parenchymal echotexture. No focal parenchymal abnormality. Left Kidney: Renal measurements: Approximately 13.1 x 7.7 x 7.1 cm = volume: 375 mL. No hydronephrosis.  Well-preserved cortex. No shadowing calculi. Normal parenchymal echotexture. No focal parenchymal abnormality. Bladder: Decompressed and normal in appearance. IMPRESSION: Normal examination. Specifically, no evidence of urinary tract obstruction to explain the patient's renal insufficiency. Electronically Signed   By: Evangeline Dakin M.D.   On: 12/18/2018 10:18        Scheduled Meds: . atorvastatin  10 mg Oral q1800  . enoxaparin (LOVENOX) injection  30 mg Subcutaneous Q24H  . insulin aspart  0-15 Units Subcutaneous TID WC  . latanoprost  1 drop Both Eyes QHS  . PARoxetine  30 mg Oral q morning - 10a   Continuous Infusions: . sodium chloride 100 mL/hr at 12/19/18 0557  . vancomycin Stopped (12/19/18 0302)     LOS: 2 days    Time spent:43mins    Kathie Dike, MD [facility] Pager [phone]  If 7PM-7AM, please contact night-coverage [email or web address] Password [email or web address] 12/19/2018, 5:05 PM

## 2018-12-20 ENCOUNTER — Inpatient Hospital Stay (HOSPITAL_COMMUNITY): Payer: Medicare Other

## 2018-12-20 LAB — GLUCOSE, CAPILLARY
GLUCOSE-CAPILLARY: 206 mg/dL — AB (ref 70–99)
Glucose-Capillary: 198 mg/dL — ABNORMAL HIGH (ref 70–99)
Glucose-Capillary: 224 mg/dL — ABNORMAL HIGH (ref 70–99)
Glucose-Capillary: 193 mg/dL — ABNORMAL HIGH (ref 70–99)

## 2018-12-20 LAB — BASIC METABOLIC PANEL
Anion gap: 6 (ref 5–15)
BUN: 24 mg/dL — ABNORMAL HIGH (ref 8–23)
CO2: 23 mmol/L (ref 22–32)
CREATININE: 2.03 mg/dL — AB (ref 0.44–1.00)
Calcium: 8.8 mg/dL — ABNORMAL LOW (ref 8.9–10.3)
Chloride: 108 mmol/L (ref 98–111)
GFR calc Af Amer: 28 mL/min — ABNORMAL LOW (ref >60)
GFR calc non Af Amer: 24 mL/min — ABNORMAL LOW (ref >60)
Glucose, Bld: 242 mg/dL — ABNORMAL HIGH (ref 70–99)
Potassium: 4.2 mmol/L (ref 3.5–5.1)
Sodium: 137 mmol/L (ref 135–145)

## 2018-12-20 LAB — CBC
HCT: 28.4 % — ABNORMAL LOW (ref 36.0–46.0)
Hemoglobin: 8.9 g/dL — ABNORMAL LOW (ref 12.0–15.0)
MCH: 30.5 pg (ref 26.0–34.0)
MCHC: 31.3 g/dL (ref 30.0–36.0)
MCV: 97.3 fL (ref 80.0–100.0)
Platelets: 147 10*3/uL — ABNORMAL LOW (ref 150–400)
RBC: 2.92 MIL/uL — ABNORMAL LOW (ref 3.87–5.11)
RDW: 15.5 % (ref 11.5–15.5)
WBC: 5.4 10*3/uL (ref 4.0–10.5)
nRBC: 0 % (ref 0.0–0.2)

## 2018-12-20 MED ORDER — IPRATROPIUM-ALBUTEROL 0.5-2.5 (3) MG/3ML IN SOLN: 3.0000 mL | RESPIRATORY_TRACT | Status: DC | PRN

## 2018-12-20 MED ORDER — INSULIN GLARGINE 100 UNIT/ML ~~LOC~~ SOLN
20.0000 [IU] | Freq: Every day | SUBCUTANEOUS | Status: DC
  Administered 2018-12-20 – 2018-12-22 (×3): 20 [IU] via SUBCUTANEOUS
  Filled 2018-12-20 (×4): qty 0.2

## 2018-12-20 MED ORDER — ALPRAZOLAM 0.25 MG PO TABS
0.2500 mg | ORAL_TABLET | Freq: Once | ORAL | Status: AC
  Administered 2018-12-20: 0.25 mg via ORAL
  Filled 2018-12-20: qty 1

## 2018-12-20 MED ORDER — LORATADINE 10 MG PO TABS
10.0000 mg | ORAL_TABLET | Freq: Every day | ORAL | Status: DC
  Administered 2018-12-20 – 2018-12-23 (×4): 10 mg via ORAL
  Filled 2018-12-20 (×4): qty 1

## 2018-12-20 NOTE — Care Management Important Message (Signed)
Important Message  Patient Details  Name: Vicki Moon MRN: [id number] Date of Birth: 1951/10/18   Medicare Important Message Given:  Yes    Shelda Altes 12/20/2018, 12:15 PM

## 2018-12-20 NOTE — Progress Notes (Signed)
Inpatient Diabetes Program Recommendations  AACE/ADA: New Consensus Statement on Inpatient Glycemic Control (2015)  Target Ranges:  Prepandial:   less than 140 mg/dL      Peak postprandial:   less than 180 mg/dL (1-2 hours)      Critically ill patients:  140 - 180 mg/dL   Lab Results  Component Value Date   GLUCAP 224 (H) 12/20/2018   HGBA1C 8.1 (H) 12/18/2018    Review of Glycemic Control Results for Vicki Moon, Vicki Moon (MRN [id number]) as of 12/20/2018 12:05  Ref. Range 12/19/2018 07:38 12/19/2018 11:44 12/19/2018 14:11 12/19/2018 16:14 12/19/2018 21:22 12/20/2018 07:46 12/20/2018 11:13  Glucose-Capillary Latest Ref Range: 70 - 99 mg/dL 217 (H) 289 (H) 336 (H) 295 (H) 196 (H) 198 (H) 224 (H)   Diabetes history: DM2 Outpatient Diabetes medications: Lantus 80 units hs + Humalog 22-28 units tid meal coverage Current orders for Inpatient glycemic control: Lantus 15 units qd + Novolog moderate correction tid  Inpatient Diabetes Program Recommendations:   -Add Novolog 5 units tid meal coverage if eats 50%  Thank you, Bethena Roys E. Leanora Murin, RN, MSN, CDE  Diabetes Coordinator Inpatient Glycemic Control Team Team Pager [phone] (8am-5pm) 12/20/2018 12:08 PM

## 2018-12-20 NOTE — Progress Notes (Signed)
PROGRESS NOTE    Vicki Moon  NTI:[id number] DOB: 08-05-1951 DOA: 12/17/2018 PCP: [name], MD    Brief Narrative:  67 year old female with a history of diabetes, hyperlipidemia, admitted to the hospital with right axillary pain and swelling.  Found to have significant cellulitis in her right axilla.  Started on intravenous antibiotics.   Assessment & Plan:   Principal Problem:   Cellulitis Active Problems:   Mixed hyperlipidemia   AKI (acute kidney injury) ([place])   Anemia   Type 2 diabetes mellitus ([place])   Depression   1. Cellulitis of right axilla.  Continue on vancomycin.  Continue warm compresses on area.  Patient underwent ultrasound this morning that could not exclude small abscesses in the right axilla.  Will discuss with general surgery. 2. Acute kidney injury.  Possibly ATN.  Urinalysis has been ordered.  Renal ultrasound unrevealing.  Overall creatinine appears to be stable. 3. Diabetes.  Blood sugars are elevated.  Continue on sliding scale insulin.  Increase Lantus 4. Hyperlipidemia continue statin. 5. Depression.  Continue on Paxil. 6. Shortness of breath.  Lungs are clear on exam.  Chest x-ray did not show any acute findings.  Shortness of breath may be related to anxiety.  She felt better after receiving Xanax   DVT prophylaxis: Lovenox Code Status: Full code Family Communication: No family present Disposition Plan: Discharge home once cellulitis is improved   Consultants:     Procedures:     Antimicrobials:   Vancomycin 12/27 >   Subjective: Continues to have significant pain and pressure in right axilla.  Noticed small area of purulent drainage  Objective: Vitals:   12/19/18 1340 12/19/18 2123 12/20/18 0540 12/20/18 1329  BP: (!) 164/55 (!) 144/76 (!) 128/104 (!) 134/55  Pulse: 71 66 61 72  Resp:  16 18 20   Temp: 98.3 F (36.8 C) 98 F (36.7 C) 98.1 F (36.7 C) 98.4 F (36.9 C)  TempSrc: Oral Oral Oral Oral  SpO2: 97% 100%  99% 98%  Weight:      Height:        Intake/Output Summary (Last 24 hours) at 12/20/2018 1931 Last data filed at 12/20/2018 1900 Gross per 24 hour  Intake 840 ml  Output -  Net 840 ml   Filed Weights   12/17/18 1500 12/17/18 2051  Weight: 93 kg 95.2 kg    Examination:  General exam: Alert, awake, oriented x 3 Respiratory system: Clear to auscultation. Respiratory effort normal. Cardiovascular system:RRR. No murmurs, rubs, gallops. Gastrointestinal system: Abdomen is nondistended, soft and nontender. No organomegaly or masses felt. Normal bowel sounds heard. Central nervous system: Alert and oriented. No focal neurological deficits. Extremities: No C/C/E, +pedal pulses Skin: Continues to have large area of induration in right axilla extending into right chest Psychiatry: Judgement and insight appear normal. Mood & affect appropriate.       Data Reviewed: I have personally reviewed following labs and imaging studies  CBC: Recent Labs  Lab 12/17/18 1636 12/19/18 0506 12/20/18 0438  WBC 8.5 6.0 5.4  NEUTROABS 6.3  --   --   HGB 11.2* 10.0* 8.9*  HCT 34.5* 31.5* 28.4*  MCV 95.8 98.7 97.3  PLT 160 146* 867*   Basic Metabolic Panel: Recent Labs  Lab 12/17/18 1636 12/18/18 0543 12/19/18 0506 12/20/18 0438  NA 135 136 137 137  K 3.5 4.3 4.2 4.2  CL 104 109 109 108  CO2 24 20* 22 23  GLUCOSE 129* 248* 247* 242*  BUN  24* 24* 24* 24*  CREATININE 2.26* 2.11* 2.09* 2.03*  CALCIUM 8.9 8.0* 8.6* 8.8*   GFR: Estimated Creatinine Clearance: 28.1 mL/min (A) (by C-G formula based on SCr of 2.03 mg/dL (H)). Liver Function Tests: No results for input(s): AST, ALT, ALKPHOS, BILITOT, PROT, ALBUMIN in the last 168 hours. No results for input(s): LIPASE, AMYLASE in the last 168 hours. No results for input(s): AMMONIA in the last 168 hours. Coagulation Profile: No results for input(s): INR, PROTIME in the last 168 hours. Cardiac Enzymes: No results for input(s): CKTOTAL,  CKMB, CKMBINDEX, TROPONINI in the last 168 hours. BNP (last 3 results) No results for input(s): PROBNP in the last 8760 hours. HbA1C: Recent Labs    12/18/18 0543  HGBA1C 8.1*   CBG: Recent Labs  Lab 12/19/18 1614 12/19/18 2122 12/20/18 0746 12/20/18 1113 12/20/18 1644  GLUCAP 295* 196* 198* 224* 206*   Lipid Profile: No results for input(s): CHOL, HDL, LDLCALC, TRIG, CHOLHDL, LDLDIRECT in the last 72 hours. Thyroid Function Tests: No results for input(s): TSH, T4TOTAL, FREET4, T3FREE, THYROIDAB in the last 72 hours. Anemia Panel: Recent Labs    12/18/18 0543  FERRITIN 232  TIBC 213*  IRON 38   Sepsis Labs: Recent Labs  Lab 12/17/18 1636  LATICACIDVEN 1.4    Recent Results (from the past 240 hour(s))  Blood culture (routine x 2)     Status: None (Preliminary result)   Collection Time: 12/17/18  4:36 PM  Result Value Ref Range Status   Specimen Description   Final    RIGHT ANTECUBITAL BOTTLES DRAWN AEROBIC AND ANAEROBIC   Special Requests Blood Culture adequate volume  Final   Culture   Final    NO GROWTH 3 DAYS Performed at [facility], [address], [place], [place] [address]    Report Status PENDING  Incomplete  Blood culture (routine x 2)     Status: None (Preliminary result)   Collection Time: 12/17/18  4:38 PM  Result Value Ref Range Status   Specimen Description   Final    LEFT ANTECUBITAL BOTTLES DRAWN AEROBIC AND ANAEROBIC   Special Requests Blood Culture adequate volume  Final   Culture   Final    NO GROWTH 3 DAYS Performed at [facility], [address], [place], [place] [address]    Report Status PENDING  Incomplete         Radiology Studies: Dg Chest Port 1 View  Result Date: 12/19/2018 CLINICAL DATA:  Shortness of breath. EXAM: PORTABLE CHEST 1 VIEW COMPARISON:  Radiographs of February 19, 2018. FINDINGS: The heart size and mediastinal contours are within normal limits. Both lungs are clear. No pneumothorax or pleural effusion is  noted. The visualized skeletal structures are unremarkable. IMPRESSION: No active disease. Electronically Signed   By: Marijo Conception, M.D.   On: 12/19/2018 19:50   [place] Axilla Right  Result Date: 12/20/2018 CLINICAL DATA:  Cellulitis. EXAM: ULTRASOUND OF THE LEFT BREAST COMPARISON:  Previous exam(s). FINDINGS: Heterogeneous swollen right axilla with several small hypoechogenic areas measuring 1-2 cm in size. These could represent small areas of abscess formation. During the exam pus was actively draining. MPRESSION: Can not exclude small abscesses in the right axilla. It should be noted that during the exam possible draining. RECOMMENDATION: As per patient's attending physician. I have discussed the findings and recommendations with the patient. Results were also provided in writing at the conclusion of the visit. If applicable, a reminder letter will be sent to the patient  regarding the next appointment. BI-RADS CATEGORY  BI-RADS category 3. Electronically Signed   By: [place]   On: 12/20/2018 15:23        Scheduled Meds: . atorvastatin  10 mg Oral q1800  . enoxaparin (LOVENOX) injection  30 mg Subcutaneous Q24H  . insulin aspart  0-15 Units Subcutaneous TID WC  . insulin glargine  15 Units Subcutaneous QHS  . latanoprost  1 drop Both Eyes QHS  . loratadine  10 mg Oral Daily  . PARoxetine  30 mg Oral q morning - 10a   Continuous Infusions: . vancomycin 750 mg (12/20/18 0141)     LOS: 3 days    Time spent:54mins    Kathie Dike, MD [facility] Pager [phone]  If 7PM-7AM, please contact night-coverage [email or web address] Password [facility] 12/20/2018, 7:31 PM

## 2018-12-20 NOTE — Progress Notes (Signed)
RN paged C. Bodenheimer, NP to make him aware patient is c/o SOB when lying down, almost like panic attack per patient.  Patient states she has mucous in back of throat that she feels she is choking on at times.  No distress noted, awaiting response from NP.  P.J. Linus Mako, RN

## 2018-12-21 LAB — BASIC METABOLIC PANEL
Anion gap: 8 (ref 5–15)
BUN: 29 mg/dL — ABNORMAL HIGH (ref 8–23)
CO2: 23 mmol/L (ref 22–32)
CREATININE: 1.95 mg/dL — AB (ref 0.44–1.00)
Calcium: 8.9 mg/dL (ref 8.9–10.3)
Chloride: 107 mmol/L (ref 98–111)
GFR calc Af Amer: 30 mL/min — ABNORMAL LOW (ref >60)
GFR calc non Af Amer: 26 mL/min — ABNORMAL LOW (ref >60)
Glucose, Bld: 230 mg/dL — ABNORMAL HIGH (ref 70–99)
Potassium: 4.3 mmol/L (ref 3.5–5.1)
Sodium: 138 mmol/L (ref 135–145)

## 2018-12-21 LAB — GLUCOSE, CAPILLARY
Glucose-Capillary: 191 mg/dL — ABNORMAL HIGH (ref 70–99)
Glucose-Capillary: 268 mg/dL — ABNORMAL HIGH (ref 70–99)
Glucose-Capillary: 142 mg/dL — ABNORMAL HIGH (ref 70–99)
Glucose-Capillary: 391 mg/dL — ABNORMAL HIGH (ref 70–99)

## 2018-12-21 NOTE — Progress Notes (Signed)
PROGRESS NOTE    Vicki Moon  GYI:[id number] DOB: 01-Aug-1951 DOA: 12/17/2018 PCP: [name], MD    Brief Narrative:  68 year old female with a history of diabetes, hyperlipidemia, admitted to the hospital with right axillary pain and swelling.  Found to have significant cellulitis in her right axilla.  Started on intravenous antibiotics.   Assessment & Plan:   Principal Problem:   Cellulitis Active Problems:   Mixed hyperlipidemia   AKI (acute kidney injury) ([place])   Anemia   Type 2 diabetes mellitus ([place])   Depression   1. Cellulitis of right axilla.  Continue on vancomycin.  Continue warm compresses on area.  Ultrasound done shows possible small abscesses in right axilla.  She is having a fair amount of purulent drainage from that area.  Discussed with Dr. Constance Haw to evaluate to see if she needs formal incision and drainage 2. Acute kidney injury.  Possibly ATN.  Urinalysis has been ordered.  Renal ultrasound unrevealing.  Overall creatinine appears to be stable.  Anticipate that her renal function should improve over the next few weeks.  She will need outpatient follow-up. 3. Diabetes.  Blood sugars are elevated.  Continue on sliding scale insulin.  Increase Lantus 4. Hyperlipidemia continue statin. 5. Depression.  Continue on Paxil. 6. Shortness of breath.  Lungs are clear on exam.  Chest x-ray did not show any acute findings.  Shortness of breath may be related to anxiety.  She felt better after receiving Xanax   DVT prophylaxis: Lovenox Code Status: Full code Family Communication: No family present Disposition Plan: Discharge home once cellulitis is improved   Consultants:     Procedures:     Antimicrobials:   Vancomycin 12/27 >   Subjective: She has noted small amount of purulent fluid draining from her right axilla  Objective: Vitals:   12/20/18 1329 12/20/18 2132 12/21/18 0506 12/21/18 1307  BP: (!) 134/55 (!) 151/87 (!) 119/57 (!) 176/67    Pulse: 72 65 60 66  Resp: 20   20  Temp: 98.4 F (36.9 C) 98.9 F (37.2 C) 98.5 F (36.9 C) 98.1 F (36.7 C)  TempSrc: Oral Oral Oral Oral  SpO2: 98% 96% 97% 97%  Weight:      Height:        Intake/Output Summary (Last 24 hours) at 12/21/2018 1839 Last data filed at 12/21/2018 1700 Gross per 24 hour  Intake 1200 ml  Output -  Net 1200 ml   Filed Weights   12/17/18 1500 12/17/18 2051  Weight: 93 kg 95.2 kg    Examination:  General exam: Alert, awake, oriented x 3 Respiratory system: Clear to auscultation. Respiratory effort normal. Cardiovascular system:RRR. No murmurs, rubs, gallops. Gastrointestinal system: Abdomen is nondistended, soft and nontender. No organomegaly or masses felt. Normal bowel sounds heard. Central nervous system: Alert and oriented. No focal neurological deficits. Extremities: No C/C/E, +pedal pulses Skin: Erythema in right axilla persists.  There is some purulent drainage noted in the lateral aspect of induration.  Significant induration also persists. Psychiatry: Judgement and insight appear normal. Mood & affect appropriate.         Data Reviewed: I have personally reviewed following labs and imaging studies  CBC: Recent Labs  Lab 12/17/18 1636 12/19/18 0506 12/20/18 0438  WBC 8.5 6.0 5.4  NEUTROABS 6.3  --   --   HGB 11.2* 10.0* 8.9*  HCT 34.5* 31.5* 28.4*  MCV 95.8 98.7 97.3  PLT 160 146* 350*   Basic Metabolic Panel:  Recent Labs  Lab 12/17/18 1636 12/18/18 0543 12/19/18 0506 12/20/18 0438 12/21/18 0451  NA 135 136 137 137 138  K 3.5 4.3 4.2 4.2 4.3  CL 104 109 109 108 107  CO2 24 20* 22 23 23   GLUCOSE 129* 248* 247* 242* 230*  BUN 24* 24* 24* 24* 29*  CREATININE 2.26* 2.11* 2.09* 2.03* 1.95*  CALCIUM 8.9 8.0* 8.6* 8.8* 8.9   GFR: Estimated Creatinine Clearance: 29.3 mL/min (A) (by C-G formula based on SCr of 1.95 mg/dL (H)). Liver Function Tests: No results for input(s): AST, ALT, ALKPHOS, BILITOT, PROT, ALBUMIN  in the last 168 hours. No results for input(s): LIPASE, AMYLASE in the last 168 hours. No results for input(s): AMMONIA in the last 168 hours. Coagulation Profile: No results for input(s): INR, PROTIME in the last 168 hours. Cardiac Enzymes: No results for input(s): CKTOTAL, CKMB, CKMBINDEX, TROPONINI in the last 168 hours. BNP (last 3 results) No results for input(s): PROBNP in the last 8760 hours. HbA1C: No results for input(s): HGBA1C in the last 72 hours. CBG: Recent Labs  Lab 12/20/18 1644 12/20/18 2134 12/21/18 0807 12/21/18 1124 12/21/18 1654  GLUCAP 206* 193* 191* 268* 142*   Lipid Profile: No results for input(s): CHOL, HDL, LDLCALC, TRIG, CHOLHDL, LDLDIRECT in the last 72 hours. Thyroid Function Tests: No results for input(s): TSH, T4TOTAL, FREET4, T3FREE, THYROIDAB in the last 72 hours. Anemia Panel: No results for input(s): VITAMINB12, FOLATE, FERRITIN, TIBC, IRON, RETICCTPCT in the last 72 hours. Sepsis Labs: Recent Labs  Lab 12/17/18 1636  LATICACIDVEN 1.4    Recent Results (from the past 240 hour(s))  Blood culture (routine x 2)     Status: None (Preliminary result)   Collection Time: 12/17/18  4:36 PM  Result Value Ref Range Status   Specimen Description   Final    RIGHT ANTECUBITAL BOTTLES DRAWN AEROBIC AND ANAEROBIC   Special Requests Blood Culture adequate volume  Final   Culture   Final    NO GROWTH 4 DAYS Performed at [facility], [address] [place], [place] [address]    Report Status PENDING  Incomplete  Blood culture (routine x 2)     Status: None (Preliminary result)   Collection Time: 12/17/18  4:38 PM  Result Value Ref Range Status   Specimen Description   Final    LEFT ANTECUBITAL BOTTLES DRAWN AEROBIC AND ANAEROBIC   Special Requests Blood Culture adequate volume  Final   Culture   Final    NO GROWTH 4 DAYS Performed at [facility], [address], [place], [place] [address]    Report Status PENDING  Incomplete          Radiology Studies: [place] Axilla Right  Result Date: 12/20/2018 CLINICAL DATA:  Cellulitis. EXAM: ULTRASOUND OF THE LEFT BREAST COMPARISON:  Previous exam(s). FINDINGS: Heterogeneous swollen right axilla with several small hypoechogenic areas measuring 1-2 cm in size. These could represent small areas of abscess formation. During the exam pus was actively draining. MPRESSION: Can not exclude small abscesses in the right axilla. It should be noted that during the exam possible draining. RECOMMENDATION: As per patient's attending physician. I have discussed the findings and recommendations with the patient. Results were also provided in writing at the conclusion of the visit. If applicable, a reminder letter will be sent to the patient regarding the next appointment. BI-RADS CATEGORY  BI-RADS category 3. Electronically Signed   By: Marcello Moores  Register   On: 12/20/2018 15:23  Scheduled Meds: . atorvastatin  10 mg Oral q1800  . enoxaparin (LOVENOX) injection  30 mg Subcutaneous Q24H  . insulin aspart  0-15 Units Subcutaneous TID WC  . insulin glargine  20 Units Subcutaneous QHS  . latanoprost  1 drop Both Eyes QHS  . loratadine  10 mg Oral Daily  . PARoxetine  30 mg Oral q morning - 10a   Continuous Infusions: . vancomycin 750 mg (12/21/18 0119)     LOS: 4 days    Time spent:21mins    Kathie Dike, MD [facility] Pager [phone]  If 7PM-7AM, please contact night-coverage [email or web address] Password [facility] 12/21/2018, 6:39 PM

## 2018-12-21 NOTE — Progress Notes (Signed)
Pharmacy Antibiotic Note  Vicki Moon is a 67 y.o. female admitted on 12/17/2018 with cellulitis.  Pharmacy has been consulted for vancomycin dosing. Afebrile, WBC normal. U/S of right axilla could not exclude small abscesses in the right axilla. General surgery to evaluate.   Plan:  Continue Vancomycin 750mg  IV every 24 hours.  Goal trough 10-20 mcg/mL. Vancomycin trough at midnight  Monitor V/S, labs and levels as indicated  Height: 5\' 2"  (157.5 cm) Weight: 209 lb 14.1 oz (95.2 kg) IBW/kg (Calculated) : 50.1  Temp (24hrs), Avg:98.6 F (37 C), Min:98.4 F (36.9 C), Max:98.9 F (37.2 C)  Recent Labs  Lab 12/17/18 1636 12/18/18 0543 12/19/18 0506 12/20/18 0438 12/21/18 0451  WBC 8.5  --  6.0 5.4  --   CREATININE 2.26* 2.11* 2.09* 2.03* 1.95*  LATICACIDVEN 1.4  --   --   --   --     Estimated Creatinine Clearance: 29.3 mL/min (A) (by C-G formula based on SCr of 1.95 mg/dL (H)).    Allergies  Allergen Reactions  . Metformin Nausea Only    "severe GI"    Antimicrobials this admission: 12/28 vancomycin >> 12/28 clindamycin x 1   Microbiology results: 12/27 BCx: ngtd  Thank you for allowing pharmacy to be a part of this patient's care.  Isac Sarna, BS Pharm D, [place] Clinical Pharmacist Pager [phone] 12/21/2018 8:06 AM

## 2018-12-22 DIAGNOSIS — L02411 Cutaneous abscess of right axilla: Secondary | ICD-10-CM

## 2018-12-22 DIAGNOSIS — L03111 Cellulitis of right axilla: Secondary | ICD-10-CM

## 2018-12-22 LAB — GLUCOSE, CAPILLARY
Glucose-Capillary: 217 mg/dL — ABNORMAL HIGH (ref 70–99)
Glucose-Capillary: 260 mg/dL — ABNORMAL HIGH (ref 70–99)
Glucose-Capillary: 249 mg/dL — ABNORMAL HIGH (ref 70–99)
Glucose-Capillary: 192 mg/dL — ABNORMAL HIGH (ref 70–99)

## 2018-12-22 LAB — CULTURE, BLOOD (ROUTINE X 2)
Culture: NO GROWTH
Culture: NO GROWTH
SPECIAL REQUESTS: ADEQUATE
Special Requests: ADEQUATE

## 2018-12-22 LAB — BASIC METABOLIC PANEL
Anion gap: 7 (ref 5–15)
BUN: 27 mg/dL — ABNORMAL HIGH (ref 8–23)
CALCIUM: 9 mg/dL (ref 8.9–10.3)
CO2: 26 mmol/L (ref 22–32)
Chloride: 106 mmol/L (ref 98–111)
Creatinine, Ser: 1.94 mg/dL — ABNORMAL HIGH (ref 0.44–1.00)
GFR calc Af Amer: 30 mL/min — ABNORMAL LOW (ref >60)
GFR calc non Af Amer: 26 mL/min — ABNORMAL LOW (ref >60)
Glucose, Bld: 245 mg/dL — ABNORMAL HIGH (ref 70–99)
Potassium: 3.9 mmol/L (ref 3.5–5.1)
Sodium: 139 mmol/L (ref 135–145)

## 2018-12-22 LAB — VANCOMYCIN, TROUGH: Vancomycin Tr: 12 ug/mL — ABNORMAL LOW (ref 15–20)

## 2018-12-22 MED ORDER — MORPHINE SULFATE (PF) 2 MG/ML IV SOLN
2.0000 mg | Freq: Once | INTRAVENOUS | Status: AC
  Administered 2018-12-22: 2 mg via INTRAVENOUS
  Filled 2018-12-22: qty 1

## 2018-12-22 MED ORDER — INSULIN ASPART 100 UNIT/ML ~~LOC~~ SOLN: 0.0000 [IU] | Freq: Every day | SUBCUTANEOUS | Status: DC

## 2018-12-22 MED ORDER — INSULIN ASPART 100 UNIT/ML ~~LOC~~ SOLN
3.0000 [IU] | Freq: Three times a day (TID) | SUBCUTANEOUS | Status: DC
  Administered 2018-12-22 – 2018-12-23 (×2): 3 [IU] via SUBCUTANEOUS

## 2018-12-22 MED ORDER — ONDANSETRON HCL 4 MG/2ML IJ SOLN
4.0000 mg | Freq: Once | INTRAMUSCULAR | Status: AC
  Administered 2018-12-22: 4 mg via INTRAVENOUS
  Filled 2018-12-22: qty 2

## 2018-12-22 MED ORDER — SODIUM CHLORIDE 0.45 % IV SOLN
INTRAVENOUS | Status: DC
  Administered 2018-12-22 – 2018-12-23 (×2): via INTRAVENOUS

## 2018-12-22 MED ORDER — INSULIN ASPART 100 UNIT/ML ~~LOC~~ SOLN
0.0000 [IU] | Freq: Three times a day (TID) | SUBCUTANEOUS | Status: DC
  Administered 2018-12-22: 7 [IU] via SUBCUTANEOUS
  Administered 2018-12-23: 3 [IU] via SUBCUTANEOUS

## 2018-12-22 NOTE — Progress Notes (Signed)
Pharmacy Antibiotic Note  Vicki Moon is a 68 y.o. female admitted on 12/17/2018 with cellulitis.  Pharmacy has been consulted for vancomycin dosing. Afebrile, WBC normal. U/S of right axilla could not exclude small abscesses in the right axilla. General surgery to evaluate. Vancomycin trough 12 last night.   Plan:  Continue Vancomycin 750mg  IV every 24 hours.  Goal trough 10-15 mcg/mL. Monitor V/S, labs and levels as indicated  Height: 5\' 2"  (157.5 cm) Weight: 209 lb 14.1 oz (95.2 kg) IBW/kg (Calculated) : 50.1  Temp (24hrs), Avg:98.2 F (36.8 C), Min:98.2 F (36.8 C), Max:98.2 F (36.8 C)  Recent Labs  Lab 12/17/18 1636 12/18/18 0543 12/19/18 0506 12/20/18 0438 12/21/18 0451 12/22/18 0026 12/22/18 0620  WBC 8.5  --  6.0 5.4  --   --   --   CREATININE 2.26* 2.11* 2.09* 2.03* 1.95*  --  1.94*  LATICACIDVEN 1.4  --   --   --   --   --   --   VANCOTROUGH  --   --   --   --   --  12*  --     Estimated Creatinine Clearance: 29.4 mL/min (A) (by C-G formula based on SCr of 1.94 mg/dL (H)).    Allergies  Allergen Reactions  . Metformin Nausea Only    "severe GI"    Antimicrobials this admission: 12/28 vancomycin >> 12/28 clindamycin x 1   Microbiology results: 12/27 BCx: ngtd  Thank you for allowing pharmacy to be a part of this patient's care.  Thomasenia Sales, PharmD, MBA, BCGP Clinical Pharmacist  12/22/2018 1:16 PM

## 2018-12-22 NOTE — Consult Note (Signed)
[facility] Consult  Reason for Consult: Right axilla abscess/ cellulitis  Referring Physician: Dr. Roderic Palau   Chief Complaint    Abscess      Vicki Moon is a 68 y.o. female.  HPI: Vicki Moon is a 68 yo with a history of DM who has been having right axilla swelling and redness for about 10 days now. This continued to worsen and Vicki Moon was treated with oral antibiotics by her PCP.  Ultimately, Vicki Moon was admitted to the hospital about 5 days ago and was noted to have cellulitis in the area and [place] were done that showed no drainable collection.  Vicki Moon has been improving some but there was worry that there was still some abscess cavity that that was not being treated. Vicki Moon had a formal US 12/30 and this noted possible 1cm abscess cavities. Given this I was asked to see the patient.  Vicki Moon had her last mammogram a few years ago and this was normal. Vicki Moon has never had any breast masses or biopsies and has no family history of breast cancer.  Vicki Moon reports no prior history of any breast abscesses or other abscesses.   Past Medical History:  Diagnosis Date  . Diabetes mellitus without complication (HCC)    Type 2 IDDM x 5 years  . Glaucoma   . Herniated lumbar intervertebral disc 06/2014  . PONV (postoperative nausea and vomiting)     Past Surgical History:  Procedure Laterality Date  . CHOLECYSTECTOMY    . COLONOSCOPY N/A 06/28/2015   Procedure: COLONOSCOPY;  Surgeon: Rogene Houston, MD;  [facility];  Service: Endoscopy;  Laterality: N/A;  155  . EYE SURGERY    . LUMBAR LAMINECTOMY Right 07/12/2014   Procedure: LUMBAR FIVE TO SACRAL ONE MICRODISCECTOMY;  Surgeon: Marybelle Killings, MD;  Location: [place];  Service: Orthopedics;  Laterality: Right;  . TUBAL LIGATION    . VITRECTOMY 25 GAUGE WITH SCLERAL BUCKLE Left 07/29/2017   Procedure: RETINAL DETACHMENT REPAIR LEFT EYE WITH PARSPLANA VITRECTOMY, AIR FLUID EXCHANGE, ENDO LASTER, DRAINAGE OF SUBRETINAL FLUID,ENDOLASER PPV /25  GAUGE;  Surgeon: Jalene Mullet, MD;  Location: [place];  Service: Ophthalmology;  Laterality: Left;    Family History  Problem Relation Age of Onset  . Hypertension Mother   . Stroke Mother   . Cancer Father        brain tumor  . Diabetes Father     Social History   Tobacco Use  . Smoking status: Current Some Day Smoker    Packs/day: 1.00    Years: 10.00    Pack years: 10.00    Types: Cigarettes  . Smokeless tobacco: Never Used  . Tobacco comment:  1/2 pack a day since age 17  Substance Use Topics  . Alcohol use: No    Alcohol/week: 0.0 standard drinks  . Drug use: No    Medications:  I have reviewed the patient's current medications. Prior to Admission:  Medications Prior to Admission  Medication Sig Dispense Refill Last Dose  . atorvastatin (LIPITOR) 10 MG tablet Take 10 mg by mouth daily.  5 12/16/2018 at Unknown time  . B-D ULTRAFINE III SHORT PEN 31G X 8 MM MISC USE ONE PEN NEEDLE 4 TIMES DAILY 100 each 5 unknown  . Continuous Blood Gluc Sensor (FREESTYLE LIBRE SENSOR SYSTEM) MISC Use one sensor every 10 days. 3 each 2 unknown  . insulin lispro (HUMALOG KWIKPEN) 100 UNIT/ML KiwkPen Inject 0.22-0.28 mLs (22-28 Units total) into the skin  3 (three) times daily with meals. 25 mL 2 12/17/2018 at Unknown time  . latanoprost (XALATAN) 0.005 % ophthalmic solution Place 1 drop into both eyes at bedtime.   Past Month at Unknown time  . lisinopril (PRINIVIL,ZESTRIL) 10 MG tablet TAKE 1 TABLET BY MOUTH ONCE DAILY 90 tablet 0 12/17/2018 at Unknown time  . Multiple Vitamins-Minerals (PRESERVISION AREDS PO) Take by mouth 2 (two) times daily.   Past Month at Unknown time  . ONETOUCH VERIO test strip USE 1 STRIP TO CHECK GLUCOSE 4 TIMES DAILY 150 each 5 unknown  . PARoxetine (PAXIL) 30 MG tablet Take 30 mg by mouth every morning.  3 12/17/2018 at Unknown time  . Vitamin D, Ergocalciferol, (DRISDOL) 50000 units CAPS capsule Take 50,000 Units by mouth once a week. Mondays  1 Past Week at  Unknown time   Scheduled: . atorvastatin  10 mg Oral q1800  . enoxaparin (LOVENOX) injection  30 mg Subcutaneous Q24H  . insulin aspart  0-15 Units Subcutaneous TID WC  . insulin glargine  20 Units Subcutaneous QHS  . latanoprost  1 drop Both Eyes QHS  . loratadine  10 mg Oral Daily  . PARoxetine  30 mg Oral q morning - 10a   Continuous: . vancomycin 750 mg (12/22/18 0200)   WLS:[id number] **OR** acetaminophen, HYDROcodone-acetaminophen, ipratropium-albuterol  Allergies  Allergen Reactions  . Metformin Nausea Only    "severe GI"    ROS:  A comprehensive review of systems was negative except for: Integument/breast: positive for right axilla pain and swelling/ redness  Blood pressure (!) 162/80, pulse 67, temperature 98.2 F (36.8 C), temperature source Oral, resp. rate 18, height 5\' 2"  (1.575 m), weight 95.2 kg, SpO2 97 %. Physical Exam Vitals signs reviewed.  Constitutional:      Appearance: Normal appearance.  HENT:     Head: Normocephalic and atraumatic.     Mouth/Throat:     Mouth: Mucous membranes are moist.  Eyes:     Pupils: Pupils are equal, round, and reactive to light.  Neck:     Musculoskeletal: Normal range of motion.  Cardiovascular:     Rate and Rhythm: Normal rate.  Pulmonary:     Effort: Pulmonary effort is normal.  Chest:     Comments: Right axilla swollen and erythematous, area of draining lateral with some fluctuance and medial area of fluctuance, tender Abdominal:     General: There is no distension.     Palpations: Abdomen is soft.     Tenderness: There is no abdominal tenderness.  Musculoskeletal: Normal range of motion.  Skin:    General: Skin is warm and dry.  Neurological:     General: No focal deficit present.     Mental Status: Vicki Moon is alert and oriented to person, place, and time.  Psychiatric:        Mood and Affect: Mood normal.        Behavior: Behavior normal.        Thought Content: Thought content normal.        Judgment:  Judgment normal.     Results: Results for orders placed or performed during the hospital encounter of 12/17/18 (from the past 48 hour(s))  Glucose, capillary     Status: Abnormal   Collection Time: 12/20/18  4:44 PM  Result Value Ref Range   Glucose-Capillary 206 (H) 70 - 99 mg/dL   Comment 1 Notify RN    Comment 2 Document in Chart   Glucose, capillary  Status: Abnormal   Collection Time: 12/20/18  9:34 PM  Result Value Ref Range   Glucose-Capillary 193 (H) 70 - 99 mg/dL  Basic metabolic panel     Status: Abnormal   Collection Time: 12/21/18  4:51 AM  Result Value Ref Range   Sodium 138 135 - 145 mmol/L   Potassium 4.3 3.5 - 5.1 mmol/L   Chloride 107 98 - 111 mmol/L   CO2 23 22 - 32 mmol/L   Glucose, Bld 230 (H) 70 - 99 mg/dL   BUN 29 (H) 8 - 23 mg/dL   Creatinine, Ser 1.95 (H) 0.44 - 1.00 mg/dL   Calcium 8.9 8.9 - 10.3 mg/dL   GFR calc non Af Amer 26 (L) >60 mL/min   GFR calc Af Amer 30 (L) >60 mL/min   Anion gap 8 5 - 15    Comment: Performed at [facility], [address], [place], [place] [address]  Glucose, capillary     Status: Abnormal   Collection Time: 12/21/18  8:07 AM  Result Value Ref Range   Glucose-Capillary 191 (H) 70 - 99 mg/dL  Glucose, capillary     Status: Abnormal   Collection Time: 12/21/18 11:24 AM  Result Value Ref Range   Glucose-Capillary 268 (H) 70 - 99 mg/dL  Glucose, capillary     Status: Abnormal   Collection Time: 12/21/18  4:54 PM  Result Value Ref Range   Glucose-Capillary 142 (H) 70 - 99 mg/dL   Comment 1 Notify RN    Comment 2 Document in Chart   Glucose, capillary     Status: Abnormal   Collection Time: 12/21/18  9:04 PM  Result Value Ref Range   Glucose-Capillary 391 (H) 70 - 99 mg/dL  Vancomycin, trough     Status: Abnormal   Collection Time: 12/22/18 12:26 AM  Result Value Ref Range   Vancomycin Tr 12 (L) 15 - 20 ug/mL    Comment: Performed at [facility], [address], [place], [place] [address]  Basic metabolic panel      Status: Abnormal   Collection Time: 12/22/18  6:20 AM  Result Value Ref Range   Sodium 139 135 - 145 mmol/L   Potassium 3.9 3.5 - 5.1 mmol/L   Chloride 106 98 - 111 mmol/L   CO2 26 22 - 32 mmol/L   Glucose, Bld 245 (H) 70 - 99 mg/dL   BUN 27 (H) 8 - 23 mg/dL   Creatinine, Ser 1.94 (H) 0.44 - 1.00 mg/dL   Calcium 9.0 8.9 - 10.3 mg/dL   GFR calc non Af Amer 26 (L) >60 mL/min   GFR calc Af Amer 30 (L) >60 mL/min   Anion gap 7 5 - 15    Comment: Performed at [facility], [address], [place],  [address]  Glucose, capillary     Status: Abnormal   Collection Time: 12/22/18  7:36 AM  Result Value Ref Range   Glucose-Capillary 217 (H) 70 - 99 mg/dL   [place] Right axilla- reviewed personally- very limited views, no obvious collections noted  FINDINGS: Heterogeneous swollen right axilla with several small hypoechogenic areas measuring 1-2 cm in size. These could represent small areas of abscess formation. During the exam pus was actively draining.  Assessment & Plan:  Vicki Moon is a 68 y.o. female with a right breast cellulitis and likely abscess based on my exam. Will perform bedside I&D. We discussed that local anesthetic does not work well on an infected field, and  Vicki Moon opted to go for the I&D without local.  We discussed the risk and benefits as detailed in the procedure note.    -Bedside I&D  -Received Morphine and Zofran for the procedure  -Keep packing in place and will remove tomorrow -Cultures sent  -Will need mammogram to get back on her annual screening about 8 weeks out from this infection   All questions were answered to the satisfaction of the patient and family.    Virl Cagey 12/22/2018, 11:30 AM

## 2018-12-22 NOTE — Procedures (Signed)
[facility] Procedure Note  12/22/18  Pre-procedure Diagnosis: Right axilla abscess   Post-procedure Diagnosis: Same   Procedure(s) Performed: Incision and drainage of abscess cavity at bedside    Surgeon: Lanell Matar. Constance Haw, MD   Specimens: Cultures sent    Estimated Blood Loss: Minimal  Wound Class:Dirty/ Infected    Procedure: The patient was verbally consented for the procedure. We discussed the risk and benefits of the incision and drainage including but not limited to bleeding, inadequate drainage, inability to locate purulent pocket, and pain.  She opted to proceed.  She laid supine on her bed and her right arm was elevated over her head. The fluctuant area where purulence was draining was noted and an additional area of fluctuance medially was noted. The area was cleaned with betadine.  Using an 11 blade the draining area was incised first but minimal purulence returning. The second area was incised and over 15cc of purulence was expressed. The cavity was probed and likely was the size of a golf ball. Cultures were obtained. The larger cavity was packed and a dressing was placed in the area.   Final inspection revealed acceptable hemostasis. The patient tolerated the procedure well.    Curlene Labrum, MD [facility] [address] [place], [place] [address] [phone] (office)

## 2018-12-22 NOTE — Progress Notes (Signed)
PROGRESS NOTE    Vicki Moon  NIO:[id number] DOB: 04-14-1951 DOA: 12/17/2018 PCP: [name], MD   Brief Narrative:  Per HPI: 68 year old female with a history of diabetes, hyperlipidemia, admitted to the hospital with right axillary pain and swelling.  Found to have significant cellulitis in her right axilla.  Started on intravenous antibiotics with vancomycin and ultrasound demonstrated findings of an abscess for which general surgery was consulted and I&D was performed on 1/1.  Assessment & Plan:   Principal Problem:   Cellulitis Active Problems:   Mixed hyperlipidemia   AKI (acute kidney injury) ([place])   Anemia   Type 2 diabetes mellitus (HCC)   Depression   Abscess of right axilla   Cellulitis of axilla, right  1. Cellulitis of right axilla with abscess.  Continue on vancomycin.  Continue warm compresses on area.    Appreciate general surgery with I&D performed at bedside today.  Cultures sent.  Anticipate possible DC in a.m. with oral antibiotics. 2. Acute kidney injury-improving.  Possibly ATN.  Urinalysis has been ordered.  Renal ultrasound unrevealing.  Overall creatinine appears to be stable.  Anticipate that her renal function should improve over the next few weeks.  She will need outpatient follow-up.  Continue on half-normal saline today. 3. Diabetes.  Blood sugars are elevated.    Continue on Lantus and increase sliding scale insulin with mealtime insulin as ordered. 4. Hyperlipidemia continue statin. 5. Depression.  Continue on Paxil. 6. Shortness of breath.  Lungs are clear on exam.  Chest x-ray did not show any acute findings.  Shortness of breath may be related to anxiety.  She felt better after receiving Xanax   DVT prophylaxis: Lovenox Code Status: Full code Family Communication: No family present Disposition Plan: Discharge home once cellulitis is improved   Consultants:   General surgery  Procedures:   Bedside I&D on 1/1 to right  axilla  Antimicrobials:   Vancomycin 12/27 >  Subjective: Patient seen and evaluated today with no new acute complaints or concerns. No acute concerns or events noted overnight.  Objective: Vitals:   12/21/18 0506 12/21/18 1307 12/21/18 2104 12/22/18 0540  BP: (!) 119/57 (!) 176/67 (!) 165/47 (!) 162/80  Pulse: 60 66 60 67  Resp:  20 18 18   Temp: 98.5 F (36.9 C) 98.1 F (36.7 C) 98.2 F (36.8 C) 98.2 F (36.8 C)  TempSrc: Oral Oral Oral Oral  SpO2: 97% 97% 96% 97%  Weight:      Height:        Intake/Output Summary (Last 24 hours) at 12/22/2018 1229 Last data filed at 12/21/2018 1700 Gross per 24 hour  Intake 720 ml  Output -  Net 720 ml   Filed Weights   12/17/18 1500 12/17/18 2051  Weight: 93 kg 95.2 kg    Examination:  General exam: Appears calm and comfortable, obese Respiratory system: Clear to auscultation. Respiratory effort normal. Cardiovascular system: S1 & S2 heard, RRR. No JVD, murmurs, rubs, gallops or clicks. No pedal edema. Gastrointestinal system: Abdomen is nondistended, soft and nontender. No organomegaly or masses felt. Normal bowel sounds heard. Central nervous system: Alert and oriented. No focal neurological deficits. Extremities: Symmetric 5 x 5 power. Skin: No rashes, lesions or ulcers, right axilla posterior fold with induration and erythema as well as tenderness. Psychiatry: Judgement and insight appear normal. Mood & affect appropriate.     Data Reviewed: I have personally reviewed following labs and imaging studies  CBC: Recent Labs  Lab  12/17/18 1636 12/19/18 0506 12/20/18 0438  WBC 8.5 6.0 5.4  NEUTROABS 6.3  --   --   HGB 11.2* 10.0* 8.9*  HCT 34.5* 31.5* 28.4*  MCV 95.8 98.7 97.3  PLT 160 146* 527*   Basic Metabolic Panel: Recent Labs  Lab 12/18/18 0543 12/19/18 0506 12/20/18 0438 12/21/18 0451 12/22/18 0620  NA 136 137 137 138 139  K 4.3 4.2 4.2 4.3 3.9  CL 109 109 108 107 106  CO2 20* 22 23 23 26   GLUCOSE  248* 247* 242* 230* 245*  BUN 24* 24* 24* 29* 27*  CREATININE 2.11* 2.09* 2.03* 1.95* 1.94*  CALCIUM 8.0* 8.6* 8.8* 8.9 9.0   GFR: Estimated Creatinine Clearance: 29.4 mL/min (A) (by C-G formula based on SCr of 1.94 mg/dL (H)). Liver Function Tests: No results for input(s): AST, ALT, ALKPHOS, BILITOT, PROT, ALBUMIN in the last 168 hours. No results for input(s): LIPASE, AMYLASE in the last 168 hours. No results for input(s): AMMONIA in the last 168 hours. Coagulation Profile: No results for input(s): INR, PROTIME in the last 168 hours. Cardiac Enzymes: No results for input(s): CKTOTAL, CKMB, CKMBINDEX, TROPONINI in the last 168 hours. BNP (last 3 results) No results for input(s): PROBNP in the last 8760 hours. HbA1C: No results for input(s): HGBA1C in the last 72 hours. CBG: Recent Labs  Lab 12/21/18 1124 12/21/18 1654 12/21/18 2104 12/22/18 0736 12/22/18 1139  GLUCAP 268* 142* 391* 217* 260*   Lipid Profile: No results for input(s): CHOL, HDL, LDLCALC, TRIG, CHOLHDL, LDLDIRECT in the last 72 hours. Thyroid Function Tests: No results for input(s): TSH, T4TOTAL, FREET4, T3FREE, THYROIDAB in the last 72 hours. Anemia Panel: No results for input(s): VITAMINB12, FOLATE, FERRITIN, TIBC, IRON, RETICCTPCT in the last 72 hours. Sepsis Labs: Recent Labs  Lab 12/17/18 1636  LATICACIDVEN 1.4    Recent Results (from the past 240 hour(s))  Blood culture (routine x 2)     Status: None   Collection Time: 12/17/18  4:36 PM  Result Value Ref Range Status   Specimen Description   Final    RIGHT ANTECUBITAL BOTTLES DRAWN AEROBIC AND ANAEROBIC   Special Requests Blood Culture adequate volume  Final   Culture   Final    NO GROWTH 5 DAYS Performed at [facility], [address], [place], [place] [address]    Report Status 12/22/2018 FINAL  Final  Blood culture (routine x 2)     Status: None   Collection Time: 12/17/18  4:38 PM  Result Value Ref Range Status   Specimen Description    Final    LEFT ANTECUBITAL BOTTLES DRAWN AEROBIC AND ANAEROBIC   Special Requests Blood Culture adequate volume  Final   Culture   Final    NO GROWTH 5 DAYS Performed at [facility], [address], [place], [place] [address]    Report Status 12/22/2018 FINAL  Final         Radiology Studies: No results found.      Scheduled Meds: . atorvastatin  10 mg Oral q1800  . enoxaparin (LOVENOX) injection  30 mg Subcutaneous Q24H  . insulin aspart  0-20 Units Subcutaneous TID WC  . insulin aspart  0-5 Units Subcutaneous QHS  . insulin aspart  3 Units Subcutaneous TID WC  . insulin glargine  20 Units Subcutaneous QHS  . latanoprost  1 drop Both Eyes QHS  . loratadine  10 mg Oral Daily  . PARoxetine  30 mg Oral q morning - 10a  Continuous Infusions: . sodium chloride    . vancomycin 750 mg (12/22/18 0200)     LOS: 5 days    Time spent: 30 minutes     Darleen Crocker, DO [facility] Pager [phone]  If 7PM-7AM, please contact night-coverage [email or web address] Password [facility] 12/22/2018, 12:29 PM

## 2018-12-23 LAB — BASIC METABOLIC PANEL
Anion gap: 6 (ref 5–15)
BUN: 24 mg/dL — ABNORMAL HIGH (ref 8–23)
CALCIUM: 8.6 mg/dL — AB (ref 8.9–10.3)
CO2: 27 mmol/L (ref 22–32)
Chloride: 106 mmol/L (ref 98–111)
Creatinine, Ser: 1.87 mg/dL — ABNORMAL HIGH (ref 0.44–1.00)
GFR calc Af Amer: 31 mL/min — ABNORMAL LOW (ref >60)
GFR calc non Af Amer: 27 mL/min — ABNORMAL LOW (ref >60)
GLUCOSE: 174 mg/dL — AB (ref 70–99)
Potassium: 4 mmol/L (ref 3.5–5.1)
Sodium: 139 mmol/L (ref 135–145)

## 2018-12-23 LAB — CBC
HCT: 30.6 % — ABNORMAL LOW (ref 36.0–46.0)
Hemoglobin: 9.9 g/dL — ABNORMAL LOW (ref 12.0–15.0)
MCH: 31 pg (ref 26.0–34.0)
MCHC: 32.4 g/dL (ref 30.0–36.0)
MCV: 95.9 fL (ref 80.0–100.0)
Platelets: 161 10*3/uL (ref 150–400)
RBC: 3.19 MIL/uL — ABNORMAL LOW (ref 3.87–5.11)
RDW: 14.6 % (ref 11.5–15.5)
WBC: 4.2 10*3/uL (ref 4.0–10.5)
nRBC: 0 % (ref 0.0–0.2)

## 2018-12-23 LAB — GLUCOSE, CAPILLARY
Glucose-Capillary: 133 mg/dL — ABNORMAL HIGH (ref 70–99)
Glucose-Capillary: 222 mg/dL — ABNORMAL HIGH (ref 70–99)

## 2018-12-23 MED ORDER — AMOXICILLIN-POT CLAVULANATE 875-125 MG PO TABS: 1.0000 | ORAL_TABLET | Freq: Two times a day (BID) | ORAL | 0 refills | Status: AC

## 2018-12-23 NOTE — Discharge Instructions (Signed)
Remove packing and gauze and replace gauze with tape daily and as needed. Expect drainage that is brown to blood tinged.  If having copious amounts of blood (soaking through multiple gauze in an hour), then call or go to the ED.  Ok to shower just remove dressing before and replace after the shower.  DO not scrub the area, just let soap and water wash over the area. Finish your antibiotics by mouth.

## 2018-12-23 NOTE — Progress Notes (Signed)
Inpatient Diabetes Program Recommendations  AACE/ADA: New Consensus Statement on Inpatient Glycemic Control (2015)  Target Ranges:  Prepandial:   less than 140 mg/dL      Peak postprandial:   less than 180 mg/dL (1-2 hours)      Critically ill patients:  140 - 180 mg/dL   Lab Results  Component Value Date   GLUCAP 222 (H) 12/23/2018   HGBA1C 8.1 (H) 12/18/2018    Review of Glycemic Control Results for Vicki Moon, Vicki Moon (MRN [id number]) as of 12/23/2018 12:24  Ref. Range 12/22/2018 07:36 12/22/2018 11:39 12/22/2018 16:09 12/22/2018 22:09 12/23/2018 07:37 12/23/2018 11:19  Glucose-Capillary Latest Ref Range: 70 - 99 mg/dL 217 (H) 260 (H) 249 (H) 192 (H) 133 (H) 222 (H)   Diabetes history: DM2 Outpatient Diabetes medications: Lantus 80 units hs + Humalog 22-28 units tid meal coverage Current orders for Inpatient glycemic control: Lantus 20 units qd + Novolog 3 units tid meal coverage+ Novolog resistant correction tid  Inpatient Diabetes Program Recommendations:   -Add Novolog 5 units tid meal coverage if eats 50%  Thank you, Bethena Roys E. Marlisa Caridi, RN, MSN, CDE  Diabetes Coordinator Inpatient Glycemic Control Team Team Pager [phone] (8am-5pm) 12/23/2018 12:26 PM

## 2018-12-23 NOTE — Progress Notes (Signed)
[facility] Progress Note     Subjective: Doing well. Having some pain but overall improved. Dressing reinforced overnight.   Objective: Vital signs in last 24 hours: Temp:  [98.4 F (36.9 C)-98.6 F (37 C)] 98.4 F (36.9 C) (01/02 0552) Pulse Rate:  [60-63] 60 (01/02 0552) Resp:  [18-20] 18 (01/02 0552) BP: (141-151)/(55-72) 141/55 (01/02 0552) SpO2:  [94 %-95 %] 95 % (01/02 0552) Last BM Date: 12/18/18  Intake/Output from previous day: 01/01 0701 - 01/02 0700 In: 1714.2 [I.V.:1114.2; IV Piggyback:600] Out: -  Intake/Output this shift: No intake/output data recorded.  General appearance: alert, cooperative and no distress Resp: normal work breathing Incision/Wound: right axilla incision with some bloody drainage, induration around area, improving erythema   Lab Results:  Recent Labs    12/23/18 0431  WBC 4.2  HGB 9.9*  HCT 30.6*  PLT 161   BMET Recent Labs    12/22/18 0620 12/23/18 0431  NA 139 139  K 3.9 4.0  CL 106 106  CO2 26 27  GLUCOSE 245* 174*  BUN 27* 24*  CREATININE 1.94* 1.87*  CALCIUM 9.0 8.6*   Anti-infectives: Anti-infectives (From admission, onward)   Start     Dose/Rate Route Frequency Ordered Stop   12/19/18 0100  vancomycin (VANCOCIN) IVPB 750 mg/150 ml premix     750 mg 150 mL/hr over 60 Minutes Intravenous Every 24 hours 12/18/18 0721     12/17/18 2100  vancomycin (VANCOCIN) 2,000 mg in sodium chloride 0.9 % 500 mL IVPB     2,000 mg 250 mL/hr over 120 Minutes Intravenous  Once 12/17/18 2058 12/18/18 0300   12/17/18 1630  clindamycin (CLEOCIN) IVPB 900 mg     900 mg 100 mL/hr over 30 Minutes Intravenous  Once 12/17/18 1617 12/17/18 1740      Assessment/Plan: Vicki Moon is a 68 yo with a right axillary abscess that is now day 1 s/p I&D of the area. Doing better. Packing removed and replaced. Patient shown dressing change. Can shower. Send home on Augmentin for 7 more days given her diabetes. Will see Tuesday  12/7 in the clinic.    LOS: 6 days    Virl Cagey 12/23/2018

## 2018-12-23 NOTE — Discharge Summary (Signed)
Physician Discharge Summary  Vicki Moon HRC:[id number] DOB: 1951-06-01 DOA: 12/17/2018  PCP: [name], MD  Admit date: 12/17/2018  Discharge date: 12/23/2018  Admitted From:Home  Disposition:  Home  Recommendations for Outpatient Follow-up:  1. Follow up with PCP in 1-2 weeks with repeat BMP to recheck creatinine levels 2. Follow-up with general surgery Dr. Constance Haw on 12/28/2018 3. Continue on Augmentin as prescribed for 7 more days to complete course of treatment 4. May shower and perform dressing changes as instructed  Home Health: None  Equipment/Devices: None  Discharge Condition: Stable  CODE STATUS: Full  Diet recommendation: Heart Healthy/carb modified  Brief/Interim Summary: Per HPI: 68 year old female with a history of diabetes, hyperlipidemia, admitted to the hospital with right axillary pain and swelling. Found to have significant cellulitis in her right axilla. She was started on intravenous antibiotics with vancomycin and ultrasound demonstrated findings of an abscess for which general surgery was consulted and I&D was performed on 1/1.  Gram stain currently demonstrating few gram-positive cocci.  Patient denies any further pain at this time and will be discharged on oral Augmentin for 7 more days to follow-up with general surgery on 12/28/2018 as noted above.  No other acute events noted during the course of this hospitalization.  She was noted to have some high blood glucose levels related to the infection and may resume her usual home insulin dosing at this time.  Her AKI is resolving as well and should be followed up by her PCP at next office visit with repeat BMP.  Her creatinine on discharge today is 1.87 and has been demonstrating a downtrend over the last several days.  Discharge Diagnoses:  Principal Problem:   Cellulitis Active Problems:   Mixed hyperlipidemia   AKI (acute kidney injury) ([place])   Anemia   Type 2 diabetes mellitus (HCC)    Depression   Abscess of right axilla   Cellulitis of axilla, right  Principal discharge diagnosis: Right axilla cellulitis with abscess.  Discharge Instructions  Discharge Instructions    Call MD for:  redness, tenderness, or signs of infection (pain, swelling, redness, odor or green/yellow discharge around incision site)   Complete by:  As directed    Call MD for:  severe uncontrolled pain   Complete by:  As directed    Diet - low sodium heart healthy   Complete by:  As directed    Increase activity slowly   Complete by:  As directed      Allergies as of 12/23/2018      Reactions   Metformin Nausea Only   "severe GI"      Medication List    TAKE these medications   amoxicillin-clavulanate 875-125 MG tablet Commonly known as:  AUGMENTIN Take 1 tablet by mouth 2 (two) times daily for 7 days.   atorvastatin 10 MG tablet Commonly known as:  LIPITOR Take 10 mg by mouth daily.   B-D ULTRAFINE III SHORT PEN 31G X 8 MM Misc Generic drug:  Insulin Pen Needle USE ONE PEN NEEDLE 4 TIMES DAILY   FREESTYLE LIBRE SENSOR SYSTEM Misc Use one sensor every 10 days.   insulin lispro 100 UNIT/ML KiwkPen Commonly known as:  HUMALOG KWIKPEN Inject 0.22-0.28 mLs (22-28 Units total) into the skin 3 (three) times daily with meals.   latanoprost 0.005 % ophthalmic solution Commonly known as:  XALATAN Place 1 drop into both eyes at bedtime.   lisinopril 10 MG tablet Commonly known as:  PRINIVIL,ZESTRIL TAKE 1 TABLET  BY MOUTH ONCE DAILY   ONETOUCH VERIO test strip Generic drug:  glucose blood USE 1 STRIP TO CHECK GLUCOSE 4 TIMES DAILY   PARoxetine 30 MG tablet Commonly known as:  PAXIL Take 30 mg by mouth every morning.   PRESERVISION AREDS PO Take by mouth 2 (two) times daily.   Vitamin D (Ergocalciferol) 1.25 MG (50000 UT) Caps capsule Commonly known as:  DRISDOL Take 50,000 Units by mouth once a week. Mondays      Follow-up Information    Virl Cagey, MD Follow  up on 12/28/2018.   Specialty:  General Surgery Contact information: [address] [facility] [address] [phone]        [name], MD Follow up in 2 week(s).   Specialty:  Internal Medicine Contact information: [place] [address] [phone]          Allergies  Allergen Reactions  . Metformin Nausea Only    "severe GI"    Consultations:  General surgery   Procedures/Studies: US Renal  Result Date: 12/18/2018 CLINICAL DATA:  Acute renal insufficiency. EXAM: RENAL / URINARY TRACT ULTRASOUND COMPLETE COMPARISON:  CT abdomen and pelvis 02/16/2017, 06/01/2015. No prior ultrasound. FINDINGS: Right Kidney: Renal measurements: Approximately 13.3 x 6.1 x 7.2 cm = volume: 301 mL . No hydronephrosis. Well-preserved cortex. No shadowing calculi. Normal parenchymal echotexture. No focal parenchymal abnormality. Left Kidney: Renal measurements: Approximately 13.1 x 7.7 x 7.1 cm = volume: 375 mL. No hydronephrosis. Well-preserved cortex. No shadowing calculi. Normal parenchymal echotexture. No focal parenchymal abnormality. Bladder: Decompressed and normal in appearance. IMPRESSION: Normal examination. Specifically, no evidence of urinary tract obstruction to explain the patient's renal insufficiency. Electronically Signed   By: Evangeline Dakin M.D.   On: 12/18/2018 10:18   Dg Chest Port 1 View  Result Date: 12/19/2018 CLINICAL DATA:  Shortness of breath. EXAM: PORTABLE CHEST 1 VIEW COMPARISON:  Radiographs of February 19, 2018. FINDINGS: The heart size and mediastinal contours are within normal limits. Both lungs are clear. No pneumothorax or pleural effusion is noted. The visualized skeletal structures are unremarkable. IMPRESSION: No active disease. Electronically Signed   By: Marijo Conception, M.D.   On: 12/19/2018 19:50   [place] Axilla Right  Result Date: 12/20/2018 CLINICAL DATA:  Cellulitis. EXAM: ULTRASOUND OF THE LEFT BREAST COMPARISON:  Previous  exam(s). FINDINGS: Heterogeneous swollen right axilla with several small hypoechogenic areas measuring 1-2 cm in size. These could represent small areas of abscess formation. During the exam pus was actively draining. MPRESSION: Can not exclude small abscesses in the right axilla. It should be noted that during the exam possible draining. RECOMMENDATION: As per patient's attending physician. I have discussed the findings and recommendations with the patient. Results were also provided in writing at the conclusion of the visit. If applicable, a reminder letter will be sent to the patient regarding the next appointment. BI-RADS CATEGORY  BI-RADS category 3. Electronically Signed   By: Marcello Moores  Register   On: 12/20/2018 15:23     Discharge Exam: Vitals:   12/22/18 2211 12/23/18 0552  BP: (!) 143/63 (!) 141/55  Pulse: 62 60  Resp: 18 18  Temp: 98.6 F (37 C) 98.4 F (36.9 C)  SpO2: 95% 95%   Vitals:   12/22/18 0540 12/22/18 1522 12/22/18 2211 12/23/18 0552  BP: (!) 162/80 (!) 151/72 (!) 143/63 (!) 141/55  Pulse: 67 63 62 60  Resp: 18 20 18 18   Temp: 98.2 F (36.8 C) 98.4  F (36.9 C) 98.6 F (37 C) 98.4 F (36.9 C)  TempSrc: Oral Oral Oral Oral  SpO2: 97% 94% 95% 95%  Weight:      Height:        General: Pt is alert, awake, not in acute distress Cardiovascular: RRR, S1/S2 +, no rubs, no gallops Respiratory: CTA bilaterally, no wheezing, no rhonchi Abdominal: Soft, NT, ND, bowel sounds + Extremities: no edema, no cyanosis    The results of significant diagnostics from this hospitalization (including imaging, microbiology, ancillary and laboratory) are listed below for reference.     Microbiology: Recent Results (from the past 240 hour(s))  Blood culture (routine x 2)     Status: None   Collection Time: 12/17/18  4:36 PM  Result Value Ref Range Status   Specimen Description   Final    RIGHT ANTECUBITAL BOTTLES DRAWN AEROBIC AND ANAEROBIC   Special Requests Blood Culture  adequate volume  Final   Culture   Final    NO GROWTH 5 DAYS Performed at [facility], [address], [place], [place] [address]    Report Status 12/22/2018 FINAL  Final  Blood culture (routine x 2)     Status: None   Collection Time: 12/17/18  4:38 PM  Result Value Ref Range Status   Specimen Description   Final    LEFT ANTECUBITAL BOTTLES DRAWN AEROBIC AND ANAEROBIC   Special Requests Blood Culture adequate volume  Final   Culture   Final    NO GROWTH 5 DAYS Performed at [facility], [address], [place], [place] [address]    Report Status 12/22/2018 FINAL  Final  Aerobic/Anaerobic Culture (surgical/deep wound)     Status: None (Preliminary result)   Collection Time: 12/22/18 11:11 AM  Result Value Ref Range Status   Specimen Description   Final    ARM Performed at [facility], [address], [place], [place] [address]    Special Requests   Final    NONE Performed at [facility], [address], [place], [place] [address]    Gram Stain   Final    RARE WBC PRESENT, PREDOMINANTLY PMN FEW GRAM POSITIVE COCCI    Culture   Final    CULTURE REINCUBATED FOR BETTER GROWTH Performed at [facility], [place] [address], [place], [place] [address]    Report Status PENDING  Incomplete     Labs: BNP (last 3 results) No results for input(s): BNP in the last 8760 hours. Basic Metabolic Panel: Recent Labs  Lab 12/19/18 0506 12/20/18 0438 12/21/18 0451 12/22/18 0620 12/23/18 0431  NA 137 137 138 139 139  K 4.2 4.2 4.3 3.9 4.0  CL 109 108 107 106 106  CO2 22 23 23 26 27   GLUCOSE 247* 242* 230* 245* 174*  BUN 24* 24* 29* 27* 24*  CREATININE 2.09* 2.03* 1.95* 1.94* 1.87*  CALCIUM 8.6* 8.8* 8.9 9.0 8.6*   Liver Function Tests: No results for input(s): AST, ALT, ALKPHOS, BILITOT, PROT, ALBUMIN in the last 168 hours. No results for input(s): LIPASE, AMYLASE in the last 168 hours. No results for input(s): AMMONIA in the last 168 hours. CBC: Recent Labs  Lab  12/17/18 1636 12/19/18 0506 12/20/18 0438 12/23/18 0431  WBC 8.5 6.0 5.4 4.2  NEUTROABS 6.3  --   --   --   HGB 11.2* 10.0* 8.9* 9.9*  HCT 34.5* 31.5* 28.4* 30.6*  MCV 95.8 98.7 97.3 95.9  PLT 160 146* 147* 161   Cardiac  Enzymes: No results for input(s): CKTOTAL, CKMB, CKMBINDEX, TROPONINI in the last 168 hours. BNP: Invalid input(s): POCBNP CBG: Recent Labs  Lab 12/22/18 1139 12/22/18 1609 12/22/18 2209 12/23/18 0737 12/23/18 1119  GLUCAP 260* 249* 192* 133* 222*   D-Dimer No results for input(s): DDIMER in the last 72 hours. Hgb A1c No results for input(s): HGBA1C in the last 72 hours. Lipid Profile No results for input(s): CHOL, HDL, LDLCALC, TRIG, CHOLHDL, LDLDIRECT in the last 72 hours. Thyroid function studies No results for input(s): TSH, T4TOTAL, T3FREE, THYROIDAB in the last 72 hours.  Invalid input(s): FREET3 Anemia work up No results for input(s): VITAMINB12, FOLATE, FERRITIN, TIBC, IRON, RETICCTPCT in the last 72 hours. Urinalysis    Component Value Date/Time   COLORURINE YELLOW 07/11/2014 1440   APPEARANCEUR HAZY (A) 07/11/2014 1440   LABSPEC 1.017 07/11/2014 1440   PHURINE 5.5 07/11/2014 1440   GLUCOSEU NEGATIVE 07/11/2014 1440   HGBUR NEGATIVE 07/11/2014 1440   BILIRUBINUR NEGATIVE 07/11/2014 1440   KETONESUR NEGATIVE 07/11/2014 1440   PROTEINUR NEGATIVE 07/11/2014 1440   UROBILINOGEN 0.2 07/11/2014 1440   NITRITE NEGATIVE 07/11/2014 1440   LEUKOCYTESUR NEGATIVE 07/11/2014 1440   Sepsis Labs Invalid input(s): PROCALCITONIN,  WBC,  LACTICIDVEN Microbiology Recent Results (from the past 240 hour(s))  Blood culture (routine x 2)     Status: None   Collection Time: 12/17/18  4:36 PM  Result Value Ref Range Status   Specimen Description   Final    RIGHT ANTECUBITAL BOTTLES DRAWN AEROBIC AND ANAEROBIC   Special Requests Blood Culture adequate volume  Final   Culture   Final    NO GROWTH 5 DAYS Performed at [facility], [address],  [place], [place] [address]    Report Status 12/22/2018 FINAL  Final  Blood culture (routine x 2)     Status: None   Collection Time: 12/17/18  4:38 PM  Result Value Ref Range Status   Specimen Description   Final    LEFT ANTECUBITAL BOTTLES DRAWN AEROBIC AND ANAEROBIC   Special Requests Blood Culture adequate volume  Final   Culture   Final    NO GROWTH 5 DAYS Performed at [facility], [address] [place], [place] [address]    Report Status 12/22/2018 FINAL  Final  Aerobic/Anaerobic Culture (surgical/deep wound)     Status: None (Preliminary result)   Collection Time: 12/22/18 11:11 AM  Result Value Ref Range Status   Specimen Description   Final    ARM Performed at [facility], [address], [place], [place] [address]    Special Requests   Final    NONE Performed at [facility], [address] [place], [place] [address]    Gram Stain   Final    RARE WBC PRESENT, PREDOMINANTLY PMN FEW GRAM POSITIVE COCCI    Culture   Final    CULTURE REINCUBATED FOR BETTER GROWTH Performed at [facility], [place] [address], [place],  [address]    Report Status PENDING  Incomplete     Time coordinating discharge: 35 minutes  SIGNED:   Rodena Goldmann, DO [facility] 12/23/2018, 12:50 PM Pager [phone]  If 7PM-7AM, please contact night-coverage [email or web address] Password [email or web address]

## 2018-12-24 DIAGNOSIS — Z0000 Encounter for general adult medical examination without abnormal findings: Secondary | ICD-10-CM | POA: Diagnosis not present

## 2018-12-25 ENCOUNTER — Telehealth: Payer: Self-pay | Admitting: General Surgery

## 2018-12-25 MED ORDER — SULFAMETHOXAZOLE-TRIMETHOPRIM 800-160 MG PO TABS: 1.0000 | ORAL_TABLET | Freq: Two times a day (BID) | ORAL | 0 refills | Status: DC

## 2018-12-25 NOTE — Telephone Encounter (Signed)
[facility]  Changed to bactrim DS given MRSA. Patient aware.   Curlene Labrum, MD [facility] [address] [place], [place] [address] [phone] (office)

## 2018-12-28 ENCOUNTER — Ambulatory Visit (INDEPENDENT_AMBULATORY_CARE_PROVIDER_SITE_OTHER): Payer: Medicare Other | Admitting: General Surgery

## 2018-12-28 ENCOUNTER — Encounter: Payer: Self-pay | Admitting: General Surgery

## 2018-12-28 VITALS — BP 168/63 | HR 65 | Temp 97.1°F | Resp 18 | Wt 202.0 lb

## 2018-12-28 DIAGNOSIS — L02411 Cutaneous abscess of right axilla: Secondary | ICD-10-CM

## 2018-12-28 LAB — AEROBIC/ANAEROBIC CULTURE (SURGICAL/DEEP WOUND)

## 2018-12-28 LAB — AEROBIC/ANAEROBIC CULTURE W GRAM STAIN (SURGICAL/DEEP WOUND)

## 2018-12-28 NOTE — Progress Notes (Signed)
[facility] Note   HPI:  68 y.o. Female presents to clinic for follow-up evaluation of her right axilla abscess. Patient reports general malaise but is feeling the axilla improving. Continues to have drainage.  Switched to Bactrim due to MRSA growth in the culture.   Review of Systems:  No fevers Drainage from axilla Generally weak and malaise  All other review of systems: otherwise negative   Vital Signs:  BP (!) 168/63 (BP Location: Left Arm, Patient Position: Sitting, Cuff Size: Normal)   Pulse 65   Temp (!) 97.1 F (36.2 C) (Temporal)   Resp 18   Wt 202 lb (91.6 kg)   BMI 36.95 kg/m    Physical Exam:  Physical Exam Vitals signs reviewed.  HENT:     Head: Normocephalic.  Cardiovascular:     Rate and Rhythm: Normal rate.  Pulmonary:     Effort: Pulmonary effort is normal.  Chest:     Comments: Right axilla with improving induration and erythema, I&D site open and some minor drainage, tender  Musculoskeletal: Normal range of motion.  Neurological:     Mental Status: She is alert.     Assessment:  68 y.o. yo Female with a right axillary abscess. Improving. On Bactrim now covering the MRSA and Proteus that grew.   Plan:  Future Appointments  Date Time Provider [place]  01/06/2019  1:45 PM Virl Cagey, MD [facility] None   Repeat wound check.  Ultimately will need a mammogram 8-12 weeks out from this infection to establish no underlying pathology   All of the above recommendations were discussed with the patient, and all of patient's questions were answered to her expressed satisfaction.  Curlene Labrum, MD [facility] [address] [place], [place] [address] [phone] (office)

## 2018-12-28 NOTE — Patient Instructions (Signed)
Continue antibiotics

## 2018-12-30 DIAGNOSIS — R944 Abnormal results of kidney function studies: Secondary | ICD-10-CM | POA: Diagnosis not present

## 2018-12-30 DIAGNOSIS — F410 Panic disorder [episodic paroxysmal anxiety] without agoraphobia: Secondary | ICD-10-CM | POA: Diagnosis not present

## 2018-12-30 DIAGNOSIS — E119 Type 2 diabetes mellitus without complications: Secondary | ICD-10-CM | POA: Diagnosis not present

## 2019-01-06 ENCOUNTER — Ambulatory Visit (INDEPENDENT_AMBULATORY_CARE_PROVIDER_SITE_OTHER): Payer: Medicare Other | Admitting: General Surgery

## 2019-01-06 ENCOUNTER — Encounter: Payer: Self-pay | Admitting: General Surgery

## 2019-01-06 VITALS — BP 141/69 | HR 80 | Temp 97.7°F | Resp 16 | Wt 203.8 lb

## 2019-01-06 DIAGNOSIS — L02411 Cutaneous abscess of right axilla: Secondary | ICD-10-CM

## 2019-01-06 NOTE — Progress Notes (Signed)
[facility] Note   HPI:  68 y.o. Female presents to clinic for follow-up evaluation of her right axilla abscess. She is doing well. No further drainage. She has finished her antibiotics.   Review of Systems:  No fever or chills No drainage All other review of systems: otherwise negative   Vital Signs:  BP (!) 141/69 (BP Location: Left Arm, Patient Position: Sitting, Cuff Size: Large)   Pulse 80   Temp 97.7 F (36.5 C) (Temporal)   Resp 16   Wt 203 lb 12.8 oz (92.4 kg)   BMI 37.28 kg/m    Physical Exam:  Physical Exam HENT:     Head: Normocephalic.  Cardiovascular:     Rate and Rhythm: Normal rate.  Lymphadenopathy:     Comments: Right axilla with improving induration, minimal erythema around the open wound < 0.5cm area, no drainage, softening  Neurological:     Mental Status: She is alert.     Assessment:  68 y.o. yo Female with a right axilla abscess with MRSA s/p I&D and antibiotic treatment.   Plan:  Need a screening mammogram in March 2020, Dr. [place] Crane can order and if any concerns can let us know  Keep an eye on the redness. The skin and opening should continue to heal and seal up.  - Follow up PRN  The above recommedations were discussed with the patient and all of patient's questions were answered to his expressed satisfaction.  Curlene Labrum, MD [facility] [address] [place], [place] [address] [phone] (office)

## 2019-01-06 NOTE — Patient Instructions (Addendum)
Need a screening mammogram in March 2020.  Keep an eye on the redness. The skin and opening should continue to heal and seal up.

## 2019-01-13 DIAGNOSIS — E119 Type 2 diabetes mellitus without complications: Secondary | ICD-10-CM | POA: Diagnosis not present

## 2019-01-13 DIAGNOSIS — N183 Chronic kidney disease, stage 3 (moderate): Secondary | ICD-10-CM | POA: Diagnosis not present

## 2019-01-13 DIAGNOSIS — E249 Cushing's syndrome, unspecified: Secondary | ICD-10-CM | POA: Diagnosis not present

## 2019-01-13 DIAGNOSIS — F39 Unspecified mood [affective] disorder: Secondary | ICD-10-CM | POA: Diagnosis not present

## 2019-01-13 DIAGNOSIS — R944 Abnormal results of kidney function studies: Secondary | ICD-10-CM | POA: Diagnosis not present

## 2019-01-13 DIAGNOSIS — B373 Candidiasis of vulva and vagina: Secondary | ICD-10-CM | POA: Diagnosis not present

## 2019-01-13 DIAGNOSIS — Z6838 Body mass index (BMI) 38.0-38.9, adult: Secondary | ICD-10-CM | POA: Diagnosis not present

## 2019-01-13 DIAGNOSIS — B9562 Methicillin resistant Staphylococcus aureus infection as the cause of diseases classified elsewhere: Secondary | ICD-10-CM | POA: Diagnosis not present

## 2019-01-13 DIAGNOSIS — F410 Panic disorder [episodic paroxysmal anxiety] without agoraphobia: Secondary | ICD-10-CM | POA: Diagnosis not present

## 2019-01-13 DIAGNOSIS — E782 Mixed hyperlipidemia: Secondary | ICD-10-CM | POA: Diagnosis not present

## 2019-01-13 DIAGNOSIS — H3530 Unspecified macular degeneration: Secondary | ICD-10-CM | POA: Diagnosis not present

## 2019-01-13 DIAGNOSIS — E1169 Type 2 diabetes mellitus with other specified complication: Secondary | ICD-10-CM | POA: Diagnosis not present

## 2019-01-13 DIAGNOSIS — F17200 Nicotine dependence, unspecified, uncomplicated: Secondary | ICD-10-CM | POA: Diagnosis not present

## 2019-01-13 DIAGNOSIS — E1122 Type 2 diabetes mellitus with diabetic chronic kidney disease: Secondary | ICD-10-CM | POA: Diagnosis not present

## 2019-01-13 DIAGNOSIS — I10 Essential (primary) hypertension: Secondary | ICD-10-CM | POA: Diagnosis not present

## 2019-01-17 DIAGNOSIS — E1122 Type 2 diabetes mellitus with diabetic chronic kidney disease: Secondary | ICD-10-CM | POA: Diagnosis not present

## 2019-01-17 DIAGNOSIS — N183 Chronic kidney disease, stage 3 (moderate): Secondary | ICD-10-CM | POA: Diagnosis not present

## 2019-01-17 DIAGNOSIS — R944 Abnormal results of kidney function studies: Secondary | ICD-10-CM | POA: Diagnosis not present

## 2019-01-18 DIAGNOSIS — H401123 Primary open-angle glaucoma, left eye, severe stage: Secondary | ICD-10-CM | POA: Diagnosis not present

## 2019-01-18 DIAGNOSIS — H353133 Nonexudative age-related macular degeneration, bilateral, advanced atrophic without subfoveal involvement: Secondary | ICD-10-CM | POA: Diagnosis not present

## 2019-02-02 DIAGNOSIS — F39 Unspecified mood [affective] disorder: Secondary | ICD-10-CM | POA: Diagnosis not present

## 2019-02-02 DIAGNOSIS — F17200 Nicotine dependence, unspecified, uncomplicated: Secondary | ICD-10-CM | POA: Diagnosis not present

## 2019-02-02 DIAGNOSIS — H401190 Primary open-angle glaucoma, unspecified eye, stage unspecified: Secondary | ICD-10-CM | POA: Diagnosis not present

## 2019-02-02 DIAGNOSIS — E1165 Type 2 diabetes mellitus with hyperglycemia: Secondary | ICD-10-CM | POA: Diagnosis not present

## 2019-02-02 DIAGNOSIS — E1169 Type 2 diabetes mellitus with other specified complication: Secondary | ICD-10-CM | POA: Diagnosis not present

## 2019-02-02 DIAGNOSIS — R944 Abnormal results of kidney function studies: Secondary | ICD-10-CM | POA: Diagnosis not present

## 2019-02-02 DIAGNOSIS — I10 Essential (primary) hypertension: Secondary | ICD-10-CM | POA: Diagnosis not present

## 2019-02-02 DIAGNOSIS — H3530 Unspecified macular degeneration: Secondary | ICD-10-CM | POA: Diagnosis not present

## 2019-02-02 DIAGNOSIS — E1122 Type 2 diabetes mellitus with diabetic chronic kidney disease: Secondary | ICD-10-CM | POA: Diagnosis not present

## 2019-02-02 DIAGNOSIS — F410 Panic disorder [episodic paroxysmal anxiety] without agoraphobia: Secondary | ICD-10-CM | POA: Diagnosis not present

## 2019-02-02 DIAGNOSIS — E119 Type 2 diabetes mellitus without complications: Secondary | ICD-10-CM | POA: Diagnosis not present

## 2019-02-02 DIAGNOSIS — Z6838 Body mass index (BMI) 38.0-38.9, adult: Secondary | ICD-10-CM | POA: Diagnosis not present

## 2019-02-02 DIAGNOSIS — N183 Chronic kidney disease, stage 3 (moderate): Secondary | ICD-10-CM | POA: Diagnosis not present

## 2019-02-02 DIAGNOSIS — E249 Cushing's syndrome, unspecified: Secondary | ICD-10-CM | POA: Diagnosis not present

## 2019-02-17 DIAGNOSIS — L089 Local infection of the skin and subcutaneous tissue, unspecified: Secondary | ICD-10-CM | POA: Diagnosis not present

## 2019-02-22 DIAGNOSIS — H3530 Unspecified macular degeneration: Secondary | ICD-10-CM | POA: Diagnosis not present

## 2019-02-22 DIAGNOSIS — R944 Abnormal results of kidney function studies: Secondary | ICD-10-CM | POA: Diagnosis not present

## 2019-02-22 DIAGNOSIS — H401190 Primary open-angle glaucoma, unspecified eye, stage unspecified: Secondary | ICD-10-CM | POA: Diagnosis not present

## 2019-02-22 DIAGNOSIS — F39 Unspecified mood [affective] disorder: Secondary | ICD-10-CM | POA: Diagnosis not present

## 2019-02-22 DIAGNOSIS — F410 Panic disorder [episodic paroxysmal anxiety] without agoraphobia: Secondary | ICD-10-CM | POA: Diagnosis not present

## 2019-02-22 DIAGNOSIS — E249 Cushing's syndrome, unspecified: Secondary | ICD-10-CM | POA: Diagnosis not present

## 2019-02-22 DIAGNOSIS — Z6838 Body mass index (BMI) 38.0-38.9, adult: Secondary | ICD-10-CM | POA: Diagnosis not present

## 2019-02-22 DIAGNOSIS — E119 Type 2 diabetes mellitus without complications: Secondary | ICD-10-CM | POA: Diagnosis not present

## 2019-02-22 DIAGNOSIS — I10 Essential (primary) hypertension: Secondary | ICD-10-CM | POA: Diagnosis not present

## 2019-02-22 DIAGNOSIS — E1165 Type 2 diabetes mellitus with hyperglycemia: Secondary | ICD-10-CM | POA: Diagnosis not present

## 2019-02-22 DIAGNOSIS — E1169 Type 2 diabetes mellitus with other specified complication: Secondary | ICD-10-CM | POA: Diagnosis not present

## 2019-02-22 DIAGNOSIS — F17200 Nicotine dependence, unspecified, uncomplicated: Secondary | ICD-10-CM | POA: Diagnosis not present

## 2019-02-23 DIAGNOSIS — E1169 Type 2 diabetes mellitus with other specified complication: Secondary | ICD-10-CM | POA: Diagnosis not present

## 2019-02-23 DIAGNOSIS — E119 Type 2 diabetes mellitus without complications: Secondary | ICD-10-CM | POA: Diagnosis not present

## 2019-02-23 DIAGNOSIS — E1165 Type 2 diabetes mellitus with hyperglycemia: Secondary | ICD-10-CM | POA: Diagnosis not present

## 2019-02-23 DIAGNOSIS — I10 Essential (primary) hypertension: Secondary | ICD-10-CM | POA: Diagnosis not present

## 2019-02-23 DIAGNOSIS — E782 Mixed hyperlipidemia: Secondary | ICD-10-CM | POA: Diagnosis not present

## 2019-03-04 DIAGNOSIS — N644 Mastodynia: Secondary | ICD-10-CM | POA: Diagnosis not present

## 2019-03-04 DIAGNOSIS — R944 Abnormal results of kidney function studies: Secondary | ICD-10-CM | POA: Diagnosis not present

## 2019-03-04 DIAGNOSIS — M25511 Pain in right shoulder: Secondary | ICD-10-CM | POA: Diagnosis not present

## 2019-03-04 DIAGNOSIS — M546 Pain in thoracic spine: Secondary | ICD-10-CM | POA: Diagnosis not present

## 2019-03-04 DIAGNOSIS — E1165 Type 2 diabetes mellitus with hyperglycemia: Secondary | ICD-10-CM | POA: Diagnosis not present

## 2019-03-04 DIAGNOSIS — L03111 Cellulitis of right axilla: Secondary | ICD-10-CM | POA: Diagnosis not present

## 2019-03-04 DIAGNOSIS — D649 Anemia, unspecified: Secondary | ICD-10-CM | POA: Diagnosis not present

## 2019-03-04 DIAGNOSIS — R0789 Other chest pain: Secondary | ICD-10-CM | POA: Diagnosis not present

## 2019-03-08 ENCOUNTER — Other Ambulatory Visit: Payer: Self-pay

## 2019-03-08 ENCOUNTER — Encounter: Payer: Self-pay | Admitting: General Surgery

## 2019-03-08 ENCOUNTER — Ambulatory Visit (INDEPENDENT_AMBULATORY_CARE_PROVIDER_SITE_OTHER): Payer: Medicare Other | Admitting: General Surgery

## 2019-03-08 VITALS — BP 128/75 | HR 65 | Temp 98.5°F | Resp 18 | Wt 199.0 lb

## 2019-03-08 DIAGNOSIS — R071 Chest pain on breathing: Secondary | ICD-10-CM

## 2019-03-08 DIAGNOSIS — R109 Unspecified abdominal pain: Secondary | ICD-10-CM

## 2019-03-08 DIAGNOSIS — M7918 Myalgia, other site: Secondary | ICD-10-CM | POA: Diagnosis not present

## 2019-03-08 DIAGNOSIS — R0789 Other chest pain: Secondary | ICD-10-CM

## 2019-03-08 NOTE — Progress Notes (Signed)
[facility] Note   HPI:  68 y.o. Female presents to clinic for evaluation of right flank pain. She has pain with movement and palpation. She reported getting antibiotics from Dr. Nevada Crane for a bump in this area that was infected, and she came here today due to concerns she had an abscess. She is only having pain with movement and with touch. She is tender over the right flank/ rib cage. She denies any injury, cough, recent fall, MVC. No pain or swelling from axilla on right where her abscess was located.   Review of Systems:  No fever or chills Pain in the right flank No nausea/vomiting  All other review of systems: otherwise negative   Vital Signs:  BP 128/75 (BP Location: Left Arm, Patient Position: Sitting, Cuff Size: Normal)   Pulse 65   Temp 98.5 F (36.9 C) (Oral)   Resp 18   Wt 199 lb (90.3 kg)   BMI 36.40 kg/m    Physical Exam:  Physical Exam HENT:     Head: Normocephalic.     Nose: Nose normal.  Cardiovascular:     Rate and Rhythm: Normal rate.  Pulmonary:     Effort: Pulmonary effort is normal.  Chest:     Comments: Right flank, lower rib cage with tenderness with palpation, no erythema, swelling or drainage Abdominal:     General: Abdomen is flat.     Palpations: Abdomen is soft.     Tenderness: There is no abdominal tenderness.  Lymphadenopathy:     Comments: Right axilla with no redness or swelling, nontender  Neurological:     Mental Status: She is alert.     Assessment:  68 y.o. yo Female with pain on the right flank more consistent with muscular / strain type pain versus some degree of costochondritis.  She denies any recent trauma or chronic cough. No signs of infection. She brought in labs from [facility] with WBC of 4.1 on 02/22/2019.   Plan:  - Muscle strain and pain- recommend tylenol and ibuprofen for pain; ice/ heat to area - If pain continues after these measures can follow up with Dr. [place] Crane. He may need to investigate further with  CXR/ CT chest, but nothing acute or surgical at this time.  - Still needs to schedule her annual mammogram, reminded patient  - PRN Follow up   All of the above recommendations were discussed with the patient, and all of patient's questions were answered to her expressed satisfaction.  Curlene Labrum, MD [facility] [address] [place], [place] [address] [phone] (office)

## 2019-03-08 NOTE — Patient Instructions (Addendum)
Take Ibuprofen and tylenol for pain.  Medication: Take tylenol and ibuprofen as needed for pain control, alternating every 4-6 hours.  Example:  Tylenol 1000mg  @ 6am, 12noon, 6pm, 38midnight (Do not exceed 4000mg  of tylenol a day). Ibuprofen 800mg  @ 9am, 3pm, 9pm, 3am (Do not exceed 3600mg  of ibuprofen a day).   Chest Wall Pain Chest wall pain is pain in or around the bones and muscles of your chest. Chest wall pain may be caused by:  An injury.  Coughing a lot.  Using your chest and arm muscles too much. Sometimes, the cause may not be known. This pain may take a few weeks or longer to get better. Follow these instructions at home: Managing pain, stiffness, and swelling If told, put ice on the painful area:  Put ice in a plastic bag.  Place a towel between your skin and the bag.  Leave the ice on for 20 minutes, 2-3 times a day.  Activity  Rest as told by your doctor.  Avoid doing things that cause pain. This includes lifting heavy items.  Ask your doctor what activities are safe for you. General instructions   Take over-the-counter and prescription medicines only as told by your doctor.  Do not use any products that contain nicotine or tobacco, such as cigarettes, e-cigarettes, and chewing tobacco. If you need help quitting, ask your doctor.  Keep all follow-up visits as told by your doctor. This is important. Contact a doctor if:  You have a fever.  Your chest pain gets worse.  You have new symptoms. Get help right away if:  You feel sick to your stomach (nauseous) or you throw up (vomit).  You feel sweaty or light-headed.  You have a cough with mucus from your lungs (sputum) or you cough up blood.  You are short of breath. These symptoms may be an emergency. Do not wait to see if the symptoms will go away. Get medical help right away. Call your local emergency services (911 in the [place] Do not drive yourself to the hospital. Summary  Chest wall pain is  pain in or around the bones and muscles of your chest.  It may be treated with ice, rest, and medicines. Your condition may also get better if you avoid doing things that cause pain.  Contact a doctor if you have a fever, chest pain that gets worse, or new symptoms.  Get help right away if you feel light-headed or you get short of breath. These symptoms may be an emergency. This information is not intended to replace advice given to you by your health care provider. Make sure you discuss any questions you have with your health care provider. Document Released: 05/26/2008 Document Revised: 06/10/2018 Document Reviewed: 06/10/2018 Elsevier Interactive Patient Education  2019 [place].  Muscle Strain A muscle strain is an injury that happens when a muscle is stretched longer than normal. This can happen during a fall, sports, or lifting. This can tear some muscle fibers. Usually, recovery from muscle strain takes 1-2 weeks. Complete healing normally takes 5-6 weeks. This condition is first treated with PRICE therapy. This involves:  Protecting your muscle from being injured again.  Resting your injured muscle.  Icing your injured muscle.  Applying pressure (compression) to your injured muscle. This may be done with a splint or elastic bandage.  Raising (elevating) your injured muscle. Your doctor may also recommend medicine for pain. Follow these instructions at home: If you have a splint:  Wear the splint  as told by your doctor. Take it off only as told by your doctor.  Loosen the splint if your fingers or toes tingle, get numb, or turn cold and blue.  Keep the splint clean.  If the splint is not waterproof: ? Do not let it get wet. ? Cover it with a watertight covering when you take a bath or a shower. Managing pain, stiffness, and swelling   If directed, put ice on your injured area. ? If you have a removable splint, take it off as told by your doctor. ? Put ice in a  plastic bag. ? Place a towel between your skin and the bag. ? Leave the ice on for 20 minutes, 2-3 times a day.  Move your fingers or toes often. This helps to avoid stiffness and lessen swelling.  Raise your injured area above the level of your heart while you are sitting or lying down.  Wear an elastic bandage as told by your doctor. Make sure it is not too tight. General instructions  Take over-the-counter and prescription medicines only as told by your doctor.  Limit your activity. Rest your injured muscle as told by your doctor. Your doctor may say that gentle movements are okay.  If physical therapy was prescribed, do exercises as told by your doctor.  Do not put pressure on any part of the splint until it is fully hardened. This may take many hours.  Do not use any products that contain nicotine or tobacco, such as cigarettes and e-cigarettes. These can delay bone healing. If you need help quitting, ask your doctor.  Warm up before you exercise. This helps to prevent more muscle strains.  Ask your doctor when it is safe to drive if you have a splint.  Keep all follow-up visits as told by your doctor. This is important. Contact a doctor if:  You have more pain or swelling in your injured area. Get help right away if:  You have any of these problems in your injured area: ? You have numbness. ? You have tingling. ? You lose a lot of strength. Summary  A muscle strain is an injury that happens when a muscle is stretched longer than normal.  This condition is first treated with PRICE therapy. This includes protecting, resting, icing, adding pressure, and raising your injury.  Limit your activity. Rest your injured muscle as told by your doctor. Your doctor may say that gentle movements are okay.  Warm up before you exercise. This helps to prevent more muscle strains. This information is not intended to replace advice given to you by your health care provider. Make sure  you discuss any questions you have with your health care provider. Document Released: 09/16/2008 Document Revised: 01/14/2017 Document Reviewed: 01/14/2017 Elsevier Interactive Patient Education  2019 [employer].

## 2019-03-16 DIAGNOSIS — J069 Acute upper respiratory infection, unspecified: Secondary | ICD-10-CM | POA: Diagnosis not present

## 2019-03-16 DIAGNOSIS — R05 Cough: Secondary | ICD-10-CM | POA: Diagnosis not present

## 2019-03-22 DIAGNOSIS — R05 Cough: Secondary | ICD-10-CM | POA: Diagnosis not present

## 2019-03-22 DIAGNOSIS — J069 Acute upper respiratory infection, unspecified: Secondary | ICD-10-CM | POA: Diagnosis not present

## 2019-03-28 ENCOUNTER — Other Ambulatory Visit: Payer: Self-pay

## 2019-03-28 ENCOUNTER — Other Ambulatory Visit: Payer: Self-pay | Admitting: Internal Medicine

## 2019-03-28 ENCOUNTER — Ambulatory Visit (HOSPITAL_COMMUNITY)
Admission: RE | Admit: 2019-03-28 | Discharge: 2019-03-28 | Disposition: A | Discharge status: Home / Self Care | Payer: Medicare Other | Source: Ambulatory Visit | Attending: Internal Medicine | Admitting: Internal Medicine

## 2019-03-28 DIAGNOSIS — R05 Cough: Secondary | ICD-10-CM | POA: Diagnosis not present

## 2019-03-28 DIAGNOSIS — R059 Cough, unspecified: Secondary | ICD-10-CM

## 2019-03-29 DIAGNOSIS — D500 Iron deficiency anemia secondary to blood loss (chronic): Secondary | ICD-10-CM | POA: Diagnosis not present

## 2019-03-29 DIAGNOSIS — F17200 Nicotine dependence, unspecified, uncomplicated: Secondary | ICD-10-CM | POA: Diagnosis not present

## 2019-03-29 DIAGNOSIS — E1165 Type 2 diabetes mellitus with hyperglycemia: Secondary | ICD-10-CM | POA: Diagnosis not present

## 2019-03-29 DIAGNOSIS — R944 Abnormal results of kidney function studies: Secondary | ICD-10-CM | POA: Diagnosis not present

## 2019-03-29 DIAGNOSIS — I10 Essential (primary) hypertension: Secondary | ICD-10-CM | POA: Diagnosis not present

## 2019-03-29 DIAGNOSIS — E782 Mixed hyperlipidemia: Secondary | ICD-10-CM | POA: Diagnosis not present

## 2019-04-04 DIAGNOSIS — E785 Hyperlipidemia, unspecified: Secondary | ICD-10-CM | POA: Diagnosis not present

## 2019-04-04 DIAGNOSIS — K219 Gastro-esophageal reflux disease without esophagitis: Secondary | ICD-10-CM | POA: Diagnosis not present

## 2019-04-04 DIAGNOSIS — I10 Essential (primary) hypertension: Secondary | ICD-10-CM | POA: Diagnosis not present

## 2019-04-04 DIAGNOSIS — R05 Cough: Secondary | ICD-10-CM | POA: Diagnosis not present

## 2019-04-04 DIAGNOSIS — F17210 Nicotine dependence, cigarettes, uncomplicated: Secondary | ICD-10-CM | POA: Diagnosis not present

## 2019-04-04 DIAGNOSIS — F331 Major depressive disorder, recurrent, moderate: Secondary | ICD-10-CM | POA: Diagnosis not present

## 2019-04-04 DIAGNOSIS — E1165 Type 2 diabetes mellitus with hyperglycemia: Secondary | ICD-10-CM | POA: Diagnosis not present

## 2019-04-04 DIAGNOSIS — E1122 Type 2 diabetes mellitus with diabetic chronic kidney disease: Secondary | ICD-10-CM | POA: Diagnosis not present

## 2019-04-04 DIAGNOSIS — N182 Chronic kidney disease, stage 2 (mild): Secondary | ICD-10-CM | POA: Diagnosis not present

## 2019-04-05 DIAGNOSIS — I10 Essential (primary) hypertension: Secondary | ICD-10-CM | POA: Diagnosis not present

## 2019-04-05 DIAGNOSIS — E1165 Type 2 diabetes mellitus with hyperglycemia: Secondary | ICD-10-CM | POA: Diagnosis not present

## 2019-04-05 DIAGNOSIS — E1169 Type 2 diabetes mellitus with other specified complication: Secondary | ICD-10-CM | POA: Diagnosis not present

## 2019-04-05 DIAGNOSIS — E119 Type 2 diabetes mellitus without complications: Secondary | ICD-10-CM | POA: Diagnosis not present

## 2019-04-05 DIAGNOSIS — E782 Mixed hyperlipidemia: Secondary | ICD-10-CM | POA: Diagnosis not present

## 2019-04-07 ENCOUNTER — Other Ambulatory Visit: Payer: Self-pay

## 2019-04-07 ENCOUNTER — Encounter (INDEPENDENT_AMBULATORY_CARE_PROVIDER_SITE_OTHER): Payer: Self-pay | Admitting: Orthopaedic Surgery

## 2019-04-07 ENCOUNTER — Ambulatory Visit (INDEPENDENT_AMBULATORY_CARE_PROVIDER_SITE_OTHER): Payer: Medicare Other | Admitting: Orthopaedic Surgery

## 2019-04-07 VITALS — Ht 62.0 in | Wt 199.0 lb

## 2019-04-07 DIAGNOSIS — M545 Low back pain, unspecified: Secondary | ICD-10-CM

## 2019-04-07 MED ORDER — HYDROCODONE-ACETAMINOPHEN 5-325 MG PO TABS: 1.0000 | ORAL_TABLET | Freq: Four times a day (QID) | ORAL | 0 refills | Status: DC | PRN

## 2019-04-07 NOTE — Progress Notes (Addendum)
Office Visit Note   Patient: Vicki Moon           Date of Birth: 06/19/1951           MRN: [id number] Visit Date: 04/07/2019              Requested by: [name], MD [place], [place] [address] PCP: [name], MD   Assessment & Plan: Visit Diagnoses:  1. Low back pain, unspecified back pain laterality, unspecified chronicity, unspecified whether sciatica present   2. Hx of MRSA infections.   Plan: We went over a walking program weight loss.  Betadine swabs she can use nasally for 3 days.  She can get CHG sponge or liquid for baths that she needs return 3 days in a row.  If she develops increased radicular pain or myelopathy she can return.  Follow-Up Instructions: No follow-ups on file.   Orders:  No orders of the defined types were placed in this encounter.  Meds ordered this encounter  Medications   HYDROcodone-acetaminophen (NORCO/VICODIN) 5-325 MG tablet    Sig: Take 1 tablet by mouth every 6 (six) hours as needed for moderate pain.    Dispense:  30 tablet    Refill:  0      Procedures: No procedures performed   Clinical Data: No additional findings.   Subjective: Chief Complaint  Patient presents with   Lower Back - Pain    HPI 68 year old female seen with recurrent low back pain.  Patient states she has had difficulty walking having trouble bending forward.  She had previous lumbar surgery 2015 by me with right L5-S1 microdiscectomy.  Patient is used cane prednisone Tylenol ice and heat without relief.  In December she had problems with infection underneath the right axilla possible spider bite several rounds of treatment with prednisone and antibiotics.  Culture showed MRSA.  No recent back spasms chills or fever.  Review of Systems positive her previous microdiscectomy L5-S1 right, history of trochanteric bursitis type 2 diabetes, hypertension, vitamin D deficiency, MRSA axilla infection.  Anxiety depression positive smoker.   Otherwise 14 point review systems negative is obtains HPI.   Objective: Vital Signs: Ht 5\' 2"  (1.575 m)    Wt 199 lb (90.3 kg)    BMI 36.40 kg/m   Physical Exam Constitutional:      Appearance: She is well-developed.  HENT:     Head: Normocephalic.     Right Ear: External ear normal.     Left Ear: External ear normal.  Eyes:     Pupils: Pupils are equal, round, and reactive to light.  Neck:     Thyroid: No thyromegaly.     Trachea: No tracheal deviation.  Cardiovascular:     Rate and Rhythm: Normal rate.  Pulmonary:     Effort: Pulmonary effort is normal.  Abdominal:     Palpations: Abdomen is soft.  Skin:    General: Skin is warm and dry.  Neurological:     Mental Status: She is alert and oriented to person, place, and time.  Psychiatric:        Behavior: Behavior normal.     Ortho Exam lumbar incision well-healed.  Increased pain with lumbar flexion.  She has negative logroll to the hips.  No gastrocsoleus anterior tib EHL weakness.  Absent right ankle jerk.  Knee jerks 2+ and symmetrical.  Negative Faber test. Specialty Comments:  No specialty comments available.  Imaging: No results found.  PMFS History: Patient Active Problem List   Diagnosis Date Noted   Pain in abdominal muscle of right flank 03/08/2019   Abscess of right axilla    Cellulitis of axilla, right    Cellulitis 12/17/2018   AKI (acute kidney injury) ([place]) 12/17/2018   Anemia 12/17/2018   Type 2 diabetes mellitus ([place]) 12/17/2018   Depression 12/17/2018   Trochanteric bursitis, right hip 08/12/2018   Post-menopausal 01/19/2018   Current smoker 01/19/2018   Mixed hyperlipidemia 07/13/2017   Class 2 severe obesity due to excess calories with serious comorbidity and body mass index (BMI) of 37.0 to 37.9 in adult ([place]) 07/13/2017   Hypercortisolemia ([place]) 01/20/2017   Excessive weight gain 01/20/2017   Generalized abdominal pain 01/20/2017   Uncontrolled type 2 diabetes  mellitus with complication, with long-term current use of insulin ([place]) 12/20/2015   Essential hypertension, benign 12/20/2015   Vitamin D deficiency 12/20/2015   Rectal bleeding 05/29/2015   HNP (herniated nucleus pulposus), lumbar 07/12/2014    Class: Diagnosis of   Past Medical History:  Diagnosis Date   Diabetes mellitus without complication ([place])    Type 2 IDDM x 5 years   Glaucoma    Herniated lumbar intervertebral disc 06/2014   PONV (postoperative nausea and vomiting)     Family History  Problem Relation Age of Onset   Hypertension Mother    Stroke Mother    Cancer Father        brain tumor   Diabetes Father     Past Surgical History:  Procedure Laterality Date   CHOLECYSTECTOMY     COLONOSCOPY N/A 06/28/2015   Procedure: COLONOSCOPY;  Surgeon: Rogene Houston, MD;  [facility];  Service: Endoscopy;  Laterality: N/A;  155   EYE SURGERY     LUMBAR LAMINECTOMY Right 07/12/2014   Procedure: LUMBAR FIVE TO SACRAL ONE MICRODISCECTOMY;  Surgeon: Marybelle Killings, MD;  Location: [facility];  Service: Orthopedics;  Laterality: Right;   TUBAL LIGATION     VITRECTOMY 25 GAUGE WITH SCLERAL BUCKLE Left 07/29/2017   Procedure: RETINAL DETACHMENT REPAIR LEFT EYE WITH PARSPLANA VITRECTOMY, AIR FLUID EXCHANGE, ENDO LASTER, DRAINAGE OF SUBRETINAL FLUID,ENDOLASER PPV /25 GAUGE;  Surgeon: Jalene Mullet, MD;  Location: [facility];  Service: Ophthalmology;  Laterality: Left;   Social History   Occupational History   Not on file  Tobacco Use   Smoking status: Current Some Day Smoker    Packs/day: 1.00    Years: 10.00    Pack years: 10.00    Types: Cigarettes   Smokeless tobacco: Never Used   Tobacco comment:  1/2 pack a day since age 38  Substance and Sexual Activity   Alcohol use: No    Alcohol/week: 0.0 standard drinks   Drug use: No   Sexual activity: Yes

## 2019-04-13 ENCOUNTER — Telehealth (INDEPENDENT_AMBULATORY_CARE_PROVIDER_SITE_OTHER): Payer: Self-pay | Admitting: Radiology

## 2019-04-13 NOTE — Telephone Encounter (Signed)
Patient called [facility] office and states that she is still in considerable pain. The pain medication is not working. She would like to know if she can proceed with MRI? Should she come in for a follow up in the [place] office tomorrow?  FYI.Marland Kitchen [facility] is scheduling MRI's at this time.  Please advise. CB for patient is [phone]

## 2019-04-13 NOTE — Telephone Encounter (Signed)
ROV to get xrays and discuss ordering MRI

## 2019-04-13 NOTE — Telephone Encounter (Signed)
Appointment made in [place] for tomorrow morning. Patient will need x-rays.

## 2019-04-14 ENCOUNTER — Encounter (INDEPENDENT_AMBULATORY_CARE_PROVIDER_SITE_OTHER): Payer: Self-pay | Admitting: Orthopaedic Surgery

## 2019-04-14 ENCOUNTER — Other Ambulatory Visit: Payer: Self-pay

## 2019-04-14 ENCOUNTER — Ambulatory Visit (INDEPENDENT_AMBULATORY_CARE_PROVIDER_SITE_OTHER): Payer: Medicare Other | Admitting: Orthopaedic Surgery

## 2019-04-14 ENCOUNTER — Ambulatory Visit (INDEPENDENT_AMBULATORY_CARE_PROVIDER_SITE_OTHER): Payer: Medicare Other

## 2019-04-14 VITALS — Ht 62.0 in | Wt 199.0 lb

## 2019-04-14 DIAGNOSIS — M545 Low back pain, unspecified: Secondary | ICD-10-CM

## 2019-04-14 NOTE — Progress Notes (Signed)
Office Visit Note   Patient: Vicki Moon           Date of Birth: 1951/11/18           MRN: [id number] Visit Date: 04/14/2019              Requested by: [name], MD [place], [place] [address] PCP: [name], MD   Assessment & Plan: Visit Diagnoses:  1. Acute left-sided low back pain, unspecified whether sciatica present     Plan: We will proceed with MRI scan due to recurrent symptoms after her previous right L5-S1 microdiscectomy 07/12/2014.  MRI lumbar with and without contrast for her recurrent pain now on the left side.  Office follow-up after MRI scan for review.  She needs to use the cane to avoid falling.  Follow-up after MRI.  Follow-Up Instructions: F/U after Lumbar MRI with and without contrast.   Orders:  Orders Placed This Encounter  Procedures  . XR Lumbar Spine 2-3 Views   No orders of the defined types were placed in this encounter.     Procedures: No procedures performed   Clinical Data: No additional findings.   Subjective: Chief Complaint  Patient presents with  . Lower Back - Pain    HPYI: 68 year old female returns with ongoing significant back pain is been present now for greater than a month with pain over the left side.  Previous microdiscectomy 2015 on the right at L5-S1.  X-rays today shows disc space narrowing.  She denies bowel bladder symptoms.  She has pain right lumbosacral does not really radiate into her legs.  Her pain is severe she states at times she has to grab something to keep from falling she has great difficulty getting from sitting to standing can get in a comfortable position.  She does have type 2 diabetes with long-term insulin.  Review of Systems 14 point update unchanged from 04/07/2019 office visit other than as mentioned in HPI.   Objective: Vital Signs: Ht 5\' 2"  (1.575 m)   Wt 199 lb (90.3 kg)   BMI 36.40 kg/m   Physical Exam Constitutional:      Appearance: She is well-developed.   HENT:     Head: Normocephalic.     Right Ear: External ear normal.     Left Ear: External ear normal.  Eyes:     Pupils: Pupils are equal, round, and reactive to light.  Neck:     Thyroid: No thyromegaly.     Trachea: No tracheal deviation.  Cardiovascular:     Rate and Rhythm: Normal rate.  Pulmonary:     Effort: Pulmonary effort is normal.  Abdominal:     Palpations: Abdomen is soft.  Skin:    General: Skin is warm and dry.  Neurological:     Mental Status: She is alert and oriented to person, place, and time.  Psychiatric:        Behavior: Behavior normal.     Ortho Exam patient slow getting from sitting to standing she is somewhat wobbly when she walks she has a cane but forgot to bring it with her today.  She has tenderness of the lumbar spine.  Incisions well-healed.  She has some sciatic notch tenderness some trochanteric bursal tenderness.  No pain with hip range of motion more tenderness on the left side than right side, lumbar.  (Opposite from her right microdiscectomy L5-S1) absent right ankle jerk trace left ankle jerk knee jerk  is 2+ and symmetrical anterior tib gastrocsoleus is strong.  Specialty Comments:  No specialty comments available.  Imaging: Xr Lumbar Spine 2-3 Views  Result Date: 04/14/2019 Three-view x-rays lumbar spine obtained and reviewed.  Lateral flexion-extension shows no listhesis there is a 50% narrowing at L5-S1 compared to previous imaging in 2015 radiographs and MRI scan. Impression: Negative for acute changes.  Evidence of disc space narrowing at L5-S1.    PMFS History: Patient Active Problem List   Diagnosis Date Noted  . Pain in abdominal muscle of right flank 03/08/2019  . Abscess of right axilla   . Cellulitis of axilla, right   . Cellulitis 12/17/2018  . AKI (acute kidney injury) ([place]) 12/17/2018  . Anemia 12/17/2018  . Type 2 diabetes mellitus ([place]) 12/17/2018  . Depression 12/17/2018  . Trochanteric bursitis, right hip  08/12/2018  . Post-menopausal 01/19/2018  . Current smoker 01/19/2018  . Mixed hyperlipidemia 07/13/2017  . Class 2 severe obesity due to excess calories with serious comorbidity and body mass index (BMI) of 37.0 to 37.9 in adult ([place]) 07/13/2017  . Hypercortisolemia ([place]) 01/20/2017  . Excessive weight gain 01/20/2017  . Generalized abdominal pain 01/20/2017  . Uncontrolled type 2 diabetes mellitus with complication, with long-term current use of insulin ([place]) 12/20/2015  . Essential hypertension, benign 12/20/2015  . Vitamin D deficiency 12/20/2015  . Rectal bleeding 05/29/2015  . HNP (herniated nucleus pulposus), lumbar 07/12/2014    Class: Diagnosis of   Past Medical History:  Diagnosis Date  . Diabetes mellitus without complication ([facility])    Type 2 IDDM x 5 years  . Glaucoma   . Herniated lumbar intervertebral disc 06/2014  . PONV (postoperative nausea and vomiting)     Family History  Problem Relation Age of Onset  . Hypertension Mother   . Stroke Mother   . Cancer Father        brain tumor  . Diabetes Father     Past Surgical History:  Procedure Laterality Date  . CHOLECYSTECTOMY    . COLONOSCOPY N/A 06/28/2015   Procedure: COLONOSCOPY;  Surgeon: Rogene Houston, MD;  [facility];  Service: Endoscopy;  Laterality: N/A;  155  . EYE SURGERY    . LUMBAR LAMINECTOMY Right 07/12/2014   Procedure: LUMBAR FIVE TO SACRAL ONE MICRODISCECTOMY;  Surgeon: Marybelle Killings, MD;  Location: [facility];  Service: Orthopedics;  Laterality: Right;  . TUBAL LIGATION    . VITRECTOMY 25 GAUGE WITH SCLERAL BUCKLE Left 07/29/2017   Procedure: RETINAL DETACHMENT REPAIR LEFT EYE WITH PARSPLANA VITRECTOMY, AIR FLUID EXCHANGE, ENDO LASTER, DRAINAGE OF SUBRETINAL FLUID,ENDOLASER PPV /25 GAUGE;  Surgeon: Jalene Mullet, MD;  Location: [facility];  Service: Ophthalmology;  Laterality: Left;   Social History   Occupational History  . Not on file  Tobacco Use  . Smoking status: Current Some Day Smoker     Packs/day: 1.00    Years: 10.00    Pack years: 10.00    Types: Cigarettes  . Smokeless tobacco: Never Used  . Tobacco comment:  1/2 pack a day since age 85  Substance and Sexual Activity  . Alcohol use: No    Alcohol/week: 0.0 standard drinks  . Drug use: No  . Sexual activity: Yes

## 2019-04-15 ENCOUNTER — Other Ambulatory Visit: Payer: Self-pay

## 2019-04-15 ENCOUNTER — Other Ambulatory Visit (INDEPENDENT_AMBULATORY_CARE_PROVIDER_SITE_OTHER): Payer: Self-pay | Admitting: Radiology

## 2019-04-15 ENCOUNTER — Ambulatory Visit (HOSPITAL_COMMUNITY)
Admission: RE | Admit: 2019-04-15 | Discharge: 2019-04-15 | Disposition: A | Discharge status: Home / Self Care | Payer: Medicare Other | Source: Ambulatory Visit | Attending: Orthopaedic Surgery | Admitting: Orthopaedic Surgery

## 2019-04-15 DIAGNOSIS — M545 Low back pain, unspecified: Secondary | ICD-10-CM

## 2019-04-15 LAB — POCT I-STAT CREATININE: Creatinine, Ser: 0.9 mg/dL (ref 0.44–1.00)

## 2019-04-15 MED ORDER — GADOBUTROL 1 MMOL/ML IV SOLN
9.0000 mL | Freq: Once | INTRAVENOUS | Status: AC | PRN
  Administered 2019-04-15: 9 mL via INTRAVENOUS

## 2019-04-15 NOTE — Progress Notes (Signed)
Per result note from Dr. Lorin Mercy, refer to Oncology. Patient was called by Dr. Lorin Mercy and given MRI results. She will wait for call from Oncology for appointment.

## 2019-04-17 ENCOUNTER — Encounter: Payer: Self-pay | Admitting: General Surgery

## 2019-04-18 ENCOUNTER — Encounter: Payer: Self-pay | Admitting: *Deleted

## 2019-04-18 DIAGNOSIS — H401133 Primary open-angle glaucoma, bilateral, severe stage: Secondary | ICD-10-CM | POA: Diagnosis not present

## 2019-04-18 DIAGNOSIS — H353133 Nonexudative age-related macular degeneration, bilateral, advanced atrophic without subfoveal involvement: Secondary | ICD-10-CM | POA: Diagnosis not present

## 2019-04-18 NOTE — Progress Notes (Signed)
Reached out to [employer] to introduce myself as the office RN Navigator and explain our new patient process. Reviewed the reason for their referral and scheduled their new patient appointment along with labs. Provided address and directions to the office including call back phone number. Reviewed with patient any concerns they may have or any possible barriers to attending their appointment.   Informed patient about my role as a navigator and that I will meet with them prior to their New Patient appointment and more fully discuss what services I can provide. At this time patient has no further questions or needs.   She is aware of visitor restrictions due to [place] [facility] and is encouraged to have an open phone line with support person during her appointment. She understood.

## 2019-04-20 DIAGNOSIS — M545 Low back pain: Secondary | ICD-10-CM | POA: Diagnosis not present

## 2019-04-21 ENCOUNTER — Ambulatory Visit (INDEPENDENT_AMBULATORY_CARE_PROVIDER_SITE_OTHER): Payer: Medicare Other | Admitting: Orthopaedic Surgery

## 2019-04-21 ENCOUNTER — Encounter: Payer: Self-pay | Admitting: *Deleted

## 2019-04-21 ENCOUNTER — Inpatient Hospital Stay (HOSPITAL_BASED_OUTPATIENT_CLINIC_OR_DEPARTMENT_OTHER): Payer: Medicare Other | Admitting: Hematology & Oncology

## 2019-04-21 ENCOUNTER — Other Ambulatory Visit: Payer: Self-pay

## 2019-04-21 ENCOUNTER — Telehealth: Payer: Self-pay | Admitting: Hematology & Oncology

## 2019-04-21 ENCOUNTER — Inpatient Hospital Stay: Payer: Medicare Other | Attending: Hematology & Oncology

## 2019-04-21 ENCOUNTER — Encounter: Payer: Self-pay | Admitting: Hematology & Oncology

## 2019-04-21 VITALS — BP 137/59 | HR 82 | Temp 98.2°F | Resp 20 | Ht 62.0 in | Wt 198.0 lb

## 2019-04-21 DIAGNOSIS — N189 Chronic kidney disease, unspecified: Secondary | ICD-10-CM

## 2019-04-21 DIAGNOSIS — D631 Anemia in chronic kidney disease: Secondary | ICD-10-CM

## 2019-04-21 DIAGNOSIS — Z809 Family history of malignant neoplasm, unspecified: Secondary | ICD-10-CM | POA: Insufficient documentation

## 2019-04-21 DIAGNOSIS — Z8614 Personal history of Methicillin resistant Staphylococcus aureus infection: Secondary | ICD-10-CM

## 2019-04-21 DIAGNOSIS — Z7984 Long term (current) use of oral hypoglycemic drugs: Secondary | ICD-10-CM

## 2019-04-21 DIAGNOSIS — D649 Anemia, unspecified: Secondary | ICD-10-CM | POA: Insufficient documentation

## 2019-04-21 DIAGNOSIS — R1084 Generalized abdominal pain: Secondary | ICD-10-CM

## 2019-04-21 DIAGNOSIS — E119 Type 2 diabetes mellitus without complications: Secondary | ICD-10-CM | POA: Diagnosis not present

## 2019-04-21 DIAGNOSIS — R748 Abnormal levels of other serum enzymes: Secondary | ICD-10-CM | POA: Diagnosis not present

## 2019-04-21 DIAGNOSIS — F17210 Nicotine dependence, cigarettes, uncomplicated: Secondary | ICD-10-CM | POA: Diagnosis not present

## 2019-04-21 DIAGNOSIS — Z79899 Other long term (current) drug therapy: Secondary | ICD-10-CM | POA: Diagnosis not present

## 2019-04-21 DIAGNOSIS — M5126 Other intervertebral disc displacement, lumbar region: Secondary | ICD-10-CM

## 2019-04-21 DIAGNOSIS — M8448XA Pathological fracture, other site, initial encounter for fracture: Secondary | ICD-10-CM | POA: Insufficient documentation

## 2019-04-21 LAB — CMP (CANCER CENTER ONLY)
ALT: 13 U/L (ref 0–44)
AST: 12 U/L — ABNORMAL LOW (ref 15–41)
Albumin: 3.3 g/dL — ABNORMAL LOW (ref 3.5–5.0)
Alkaline Phosphatase: 123 U/L (ref 38–126)
Anion gap: 5 (ref 5–15)
BUN: 12 mg/dL (ref 8–23)
CO2: 28 mmol/L (ref 22–32)
Calcium: 9.2 mg/dL (ref 8.9–10.3)
Chloride: 105 mmol/L (ref 98–111)
Creatinine: 0.75 mg/dL (ref 0.44–1.00)
GFR, Est AFR Am: >60 mL/min (ref >60)
GFR, Estimated: >60 mL/min (ref >60)
Glucose, Bld: 130 mg/dL — ABNORMAL HIGH (ref 70–99)
Potassium: 3.5 mmol/L (ref 3.5–5.1)
Sodium: 138 mmol/L (ref 135–145)
Total Bilirubin: 0.3 mg/dL (ref 0.3–1.2)
Total Protein: 8.5 g/dL — ABNORMAL HIGH (ref 6.5–8.1)

## 2019-04-21 LAB — CBC WITH DIFFERENTIAL (CANCER CENTER ONLY)
Abs Immature Granulocytes: 0.08 10*3/uL — ABNORMAL HIGH (ref 0.00–0.07)
Basophils Absolute: 0 10*3/uL (ref 0.0–0.1)
Basophils Relative: 1 %
Eosinophils Absolute: 0.1 10*3/uL (ref 0.0–0.5)
Eosinophils Relative: 2 %
HCT: 32.7 % — ABNORMAL LOW (ref 36.0–46.0)
Hemoglobin: 11 g/dL — ABNORMAL LOW (ref 12.0–15.0)
Immature Granulocytes: 2 %
Lymphocytes Relative: 30 %
Lymphs Abs: 1.4 10*3/uL (ref 0.7–4.0)
MCH: 32.7 pg (ref 26.0–34.0)
MCHC: 33.6 g/dL (ref 30.0–36.0)
MCV: 97.3 fL (ref 80.0–100.0)
Monocytes Absolute: 0.5 10*3/uL (ref 0.1–1.0)
Monocytes Relative: 11 %
Neutro Abs: 2.6 10*3/uL (ref 1.7–7.7)
Neutrophils Relative %: 54 %
Platelet Count: 151 10*3/uL (ref 150–400)
RBC: 3.36 MIL/uL — ABNORMAL LOW (ref 3.87–5.11)
RDW: 15.8 % — ABNORMAL HIGH (ref 11.5–15.5)
WBC Count: 4.7 10*3/uL (ref 4.0–10.5)
nRBC: 0.4 % — ABNORMAL HIGH (ref 0.0–0.2)

## 2019-04-21 LAB — LACTATE DEHYDROGENASE: LDH: 205 U/L — ABNORMAL HIGH (ref 98–192)

## 2019-04-21 NOTE — Telephone Encounter (Signed)
sw pt to confirm CT scans on 5/4 at 11 am. Pt aware she needs to pick up contrast at [facility]  To drink at 9 am and 10 am on 5/4.

## 2019-04-21 NOTE — Progress Notes (Signed)
Referral MD  Reason for Referral: L2 pathologic fracture  Chief Complaint  Patient presents with  . New Patient (Initial Visit)  : I have severe back pain.  HPI: Vicki Moon is a very charming 68 year old white female.  She lives in [place].  She has 3 sons.  She has several grandchildren.  She has been in good health.  She did have back surgery for a herniated disc about 5 years ago.  She was in the hospital in December with a infection in the right axilla.  She said this was MRSA.  She said that she was having pain in her side.  About a month ago, she began to have increasing lower back pain.  This did not radiate.  She was not having any bowel or bladder issues.  She had not lost weight.  She had no leg weakness.  She saw Dr. Lorin Mercy, her orthopedic surgeon.  As expected, Dr. Lorin Mercy did a very thorough work-up on her.  He did go ahead and get an MRI of her lumbar spine.  Shockingly, there was a diffuse marrow replacement.  She had extraosseous extension of soft tissue from the anterior and left lateral L2 vertebral body which appeared to have a fracture.  There is no obvious cord compression.  She was kindly referred to the [facility] for evaluation.  She said that she had some bronchitis about a month ago.  She says she had a chest x-ray done.  The x-ray was actually done on 03/28/2019.  The x-ray showed no obvious disease.  She had lab work done on 03/29/2019.  Lab work showed minimal anemia.  She had an elevated alkaline phosphatase of 118.  Her total protein was 7.5 with an albumin of 3.4.  She said that it is worse when she lies down.  She is taking some hydrocodone for this which helps but does not eliminate the pain.  She is not noted any swollen lymph nodes.  She has smoked.  She smoked since the age of 37.  She applies a 20-pack-year history of tobacco use.  There is cancer in the family.  I think her father died of brain cancer.  She has had no  sweats.  Her last mammogram was 2 years ago.  Her last colonoscopy was 2 years ago.  She has had diabetes for about 10 years.  Her appetite is okay.  She has no nausea or vomiting.  Overall, I would say performance status is ECOG 1.   Past Medical History:  Diagnosis Date  . Diabetes mellitus without complication (HCC)    Type 2 IDDM x 5 years  . Glaucoma   . Herniated lumbar intervertebral disc 06/2014  . PONV (postoperative nausea and vomiting)   :  Past Surgical History:  Procedure Laterality Date  . CHOLECYSTECTOMY    . COLONOSCOPY N/A 06/28/2015   Procedure: COLONOSCOPY;  Surgeon: Rogene Houston, MD;  [facility];  Service: Endoscopy;  Laterality: N/A;  155  . EYE SURGERY    . LUMBAR LAMINECTOMY Right 07/12/2014   Procedure: LUMBAR FIVE TO SACRAL ONE MICRODISCECTOMY;  Surgeon: Marybelle Killings, MD;  Location: [facility];  Service: Orthopedics;  Laterality: Right;  . TUBAL LIGATION    . VITRECTOMY 25 GAUGE WITH SCLERAL BUCKLE Left 07/29/2017   Procedure: RETINAL DETACHMENT REPAIR LEFT EYE WITH PARSPLANA VITRECTOMY, AIR FLUID EXCHANGE, ENDO LASTER, DRAINAGE OF SUBRETINAL FLUID,ENDOLASER PPV /25 GAUGE;  Surgeon: Jalene Mullet, MD;  Location: [facility];  Service: Ophthalmology;  Laterality: Left;  :   Current Outpatient Medications:  .  atorvastatin (LIPITOR) 10 MG tablet, Take 10 mg by mouth daily., Disp: , Rfl: 5 .  B-D ULTRAFINE III SHORT PEN 31G X 8 MM MISC, USE ONE PEN NEEDLE 4 TIMES DAILY, Disp: 100 each, Rfl: 5 .  Continuous Blood Gluc Sensor ([facility]) MISC, Use one sensor every 10 days., Disp: 3 each, Rfl: 2 .  glipiZIDE (GLUCOTROL XL) 10 MG 24 hr tablet, TAKE 1 TABLET BY MOUTH ONCE DAILY FOR SUGARS, Disp: , Rfl:  .  HYDROcodone-acetaminophen (NORCO/VICODIN) 5-325 MG tablet, Take 1 tablet by mouth every 6 (six) hours as needed for moderate pain., Disp: 30 tablet, Rfl: 0 .  insulin lispro (HUMALOG KWIKPEN) 100 UNIT/ML KiwkPen, Inject 0.22-0.28 mLs  (22-28 Units total) into the skin 3 (three) times daily with meals., Disp: 25 mL, Rfl: 2 .  latanoprost (XALATAN) 0.005 % ophthalmic solution, Place 1 drop into both eyes at bedtime., Disp: , Rfl:  .  lisinopril (PRINIVIL,ZESTRIL) 10 MG tablet, TAKE 1 TABLET BY MOUTH ONCE DAILY (Patient not taking: Reported on 04/07/2019), Disp: 90 tablet, Rfl: 0 .  Multiple Vitamins-Minerals (PRESERVISION AREDS PO), Take by mouth 2 (two) times daily., Disp: , Rfl:  .  ONETOUCH VERIO test strip, USE 1 STRIP TO CHECK GLUCOSE 4 TIMES DAILY, Disp: 150 each, Rfl: 5 .  PARoxetine (PAXIL) 30 MG tablet, Take 30 mg by mouth every morning., Disp: , Rfl: 3 .  Vitamin D, Ergocalciferol, (DRISDOL) 50000 units CAPS capsule, Take 50,000 Units by mouth once a week. Mondays, Disp: , Rfl: 1:  :  Allergies  Allergen Reactions  . Metformin Nausea Only    "severe GI"  :  Family History  Problem Relation Age of Onset  . Hypertension Mother   . Stroke Mother   . Cancer Father        brain tumor  . Diabetes Father   :  Social History   Socioeconomic History  . Marital status: Married    Spouse name: Not on file  . Number of children: Not on file  . Years of education: Not on file  . Highest education level: Not on file  Occupational History  . Not on file  Social Needs  . Financial resource strain: Not on file  . Food insecurity:    Worry: Not on file    Inability: Not on file  . Transportation needs:    Medical: Not on file    Non-medical: Not on file  Tobacco Use  . Smoking status: Current Some Day Smoker    Packs/day: 1.00    Years: 10.00    Pack years: 10.00    Types: Cigarettes  . Smokeless tobacco: Never Used  . Tobacco comment:  1/2 pack a day since age 36  Substance and Sexual Activity  . Alcohol use: No    Alcohol/week: 0.0 standard drinks  . Drug use: No  . Sexual activity: Yes  Lifestyle  . Physical activity:    Days per week: Not on file    Minutes per session: Not on file  . Stress:  Not on file  Relationships  . Social connections:    Talks on phone: Not on file    Gets together: Not on file    Attends religious service: Not on file    Active member of club or organization: Not on file    Attends meetings of clubs or organizations: Not on file  Relationship status: Not on file  . Intimate partner violence:    Fear of current or ex partner: Not on file    Emotionally abused: Not on file    Physically abused: Not on file    Forced sexual activity: Not on file  Other Topics Concern  . Not on file  Social History Narrative  . Not on file  :  Review of Systems  Constitutional: Positive for malaise/fatigue.  HENT: Negative.   Eyes: Negative.   Respiratory: Negative.   Cardiovascular: Negative.   Gastrointestinal: Negative.   Genitourinary: Negative.   Musculoskeletal: Positive for back pain.  Skin: Negative.   Neurological: Negative.   Endo/Heme/Allergies: Negative.   Psychiatric/Behavioral: Negative.      Exam: Vicki Moon is a obese white female in moderate distress secondary to lower back pain.  Her vital signs show temperature of 98.2.  Pulse 82.  Blood pressure 137/59.  Weight is 198 pounds.  Head neck exam shows no ocular or oral lesions.  She has no palpable cervical or supraclavicular lymph nodes.  Lungs are clear bilaterally.  Cardiac exam regular rate and rhythm with normal S1-S2.  There are are no murmurs, rubs or bruits.  Abdomen is obese but soft.  She has decent bowel sounds.  There is no fluid wave.  There is no palpable liver or spleen tip.  Back exam shows some tenderness in the lower back.  She has some muscle spasms in the paravertebral muscles.  Extremities shows no clubbing, cyanosis or edema.  She has decent range of motion of her joints.  She has good strength in her muscle groups.  Neurological exam is nonfocal.  Skin exam shows some benign-appearing nevi. @IPVITALS @   Recent Labs    04/21/19 1049  WBC 4.7  HGB 11.0*  HCT 32.7*   PLT 151   Recent Labs    04/21/19 1049  NA 138  K 3.5  CL 105  CO2 28  GLUCOSE 130*  BUN 12  CREATININE 0.75  CALCIUM 9.2    Blood smear review: None  Pathology: None    Assessment and Plan: Vicki Moon is a very nice 68 year old white female.  She has a pathologic fracture at L2.  I truly hope that this is going to be myeloma.  She does have an elevated total protein and is slightly depressed albumin.  We are sending off an SPEP on her blood.  She clearly needs to have a vertebroplasty at L2.  At the time of vertebroplasty, a biopsy could be done other vertebral body.  She will definitely need radiation therapy to the lumbar spine.  This will be dependent upon her diagnosis.  Hopefully, we will not find lung cancer.  This was my initial concern.  It is somewhat comforting to know that a chest x-ray just done 3 weeks ago not show any obvious lung disease.  She definitely needs to have a CT of the body done.  We will get this set up.  I spent about an hour or so with Vicki Moon.  I just feel bad she is having the pain issues.  She is afraid of taking the pain medicine.  I told her that she was not get addicted because she has a physiologic reason for the pain.  She will also need to have Xgeva or Zometa to help with her bones depending on what we find with her biopsy.  We will try to get everything set up as quickly as possible.  She lives up in [place] so we will try to have as much done close to home for her so she does not have to travel which would be quite painful for her.  I answered all of her questions.  All the time we spent face-to-face.  I know that she has a strong faith.  I gave her a prayer blanket which she very much appreciated.  We will plan to get her back to see [place] once we have the biopsy.  We will see what her SPEP shows.

## 2019-04-21 NOTE — Progress Notes (Signed)
Initial RN Navigator Patient Visit  Name: Vicki Moon Date of Referral : 04/15/19 Diagnosis: Abnormal Scans  Met with patient prior to their visit with MD. Hanley Seamen patient "Your Patient Navigator" handout which explains my role, areas in which I am able to help, and all the contact information for myself and the office. Also gave patient MD and Navigator business card. Reviewed with patient the general overview of expected course after initial diagnosis and time frame for all steps to be completed.  Patient completed visit with Dr. [name]  Revisited with patient after MD visit. Patient will need  - CT Scans scheduled for 04/25/19 @ 11:00am - Vertebroplasty with biopsy - referral placed   Patient understands all follow up procedures and expectations. They have my number to reach out for any further clarification or additional needs. Will call patient in 5-7 days to see if any further needs have presented, or if patient has any further questions or needs.

## 2019-04-22 ENCOUNTER — Ambulatory Visit
Admission: RE | Admit: 2019-04-22 | Discharge: 2019-04-22 | Disposition: A | Discharge status: Home / Self Care | Payer: Medicare Other | Source: Ambulatory Visit | Attending: Hematology & Oncology | Admitting: Hematology & Oncology

## 2019-04-22 ENCOUNTER — Encounter: Payer: Self-pay | Admitting: *Deleted

## 2019-04-22 ENCOUNTER — Other Ambulatory Visit: Payer: Self-pay | Admitting: Hematology & Oncology

## 2019-04-22 ENCOUNTER — Other Ambulatory Visit: Payer: Self-pay | Admitting: *Deleted

## 2019-04-22 DIAGNOSIS — M8448XA Pathological fracture, other site, initial encounter for fracture: Secondary | ICD-10-CM

## 2019-04-22 DIAGNOSIS — S32020A Wedge compression fracture of second lumbar vertebra, initial encounter for closed fracture: Secondary | ICD-10-CM | POA: Diagnosis not present

## 2019-04-22 HISTORY — PX: IR RADIOLOGIST EVAL & MGMT: IMG5224

## 2019-04-22 LAB — CANCER ANTIGEN 27.29: CA 27.29: 50.7 U/mL — ABNORMAL HIGH (ref 0.0–38.6)

## 2019-04-22 LAB — IGG, IGA, IGM
IgA: 35 mg/dL — ABNORMAL LOW (ref 87–352)
IgG (Immunoglobin G), Serum: 3699 mg/dL — ABNORMAL HIGH (ref 586–1602)
IgM (Immunoglobulin M), Srm: 34 mg/dL (ref 26–217)

## 2019-04-22 LAB — CEA (IN HOUSE-CHCC): CEA (CHCC-In House): 4.21 ng/mL (ref 0.00–5.00)

## 2019-04-22 LAB — KAPPA/LAMBDA LIGHT CHAINS
Kappa free light chain: 569 mg/L — ABNORMAL HIGH (ref 3.3–19.4)
Kappa, lambda light chain ratio: 90.32 — ABNORMAL HIGH (ref 0.26–1.65)
Lambda free light chains: 6.3 mg/L (ref 5.7–26.3)

## 2019-04-22 MED ORDER — HYDROCODONE-ACETAMINOPHEN 5-325 MG PO TABS: 1.0000 | ORAL_TABLET | Freq: Four times a day (QID) | ORAL | 0 refills | Status: DC | PRN

## 2019-04-22 NOTE — Progress Notes (Signed)
Followed up with the patient after yesterday's new patient visit. She is already scheduled for CT scans and has her consultation this afternoon for her vertebroplasty. I also made her aware that Dr [name] would be placing a referral to [facility] in [place].   She had a few simple questions regarding timeline of diagnostics and treatment. She has not further questions at this time. She has my contact number if she needs me.

## 2019-04-22 NOTE — Progress Notes (Signed)
Chief Complaint: Patient was consulted remotely today (TeleHealth) for lumbar spine pain at the request of Ennever,Peter R.    Referring Physician(s): Ennever,Peter R  History of Present Illness: Vicki Moon is a 68 y.o. female who began developing lower back pain in late December early January around the time she came down with an axillary soft tissue infection.  Her pain was slowly progressive over the next several months before becoming a constant severe pain that drove her to see her doctor.  Her pain is worse on the left than the right and is a near constant 9 on a scale of 1-10.  When she gets in and out of bed, her pain can flare to a 10 out of 10.  Her pain has drastically decreased her ability to perform her activities of daily living and she spends most of her day laying down or sitting as still as possible in an effort to minimize her pain.  She takes a single 5/325 hydrocodone/acetominophen in the morning and another before bed.  This helps take off the edge but does not relieve her pain.   An MRI was performed on 04/15/19 which showed diffuse marrow signal abnormality with a mild acute fracture of the superior endplate of L2 and abnormal soft tissue extending laterally on the left through the vertebral body and into the paraspinal soft tissues.  No epidural extension.   She denies any recent fall or trauma.  No cough, shortness of breath, chest pain, fever or chills.  She states she is miserable and ready to undergo any procedure that can give her relief as soon as possible.   Past Medical History:  Diagnosis Date  . Diabetes mellitus without complication (HCC)    Type 2 IDDM x 5 years  . Glaucoma   . Herniated lumbar intervertebral disc 06/2014  . PONV (postoperative nausea and vomiting)     Past Surgical History:  Procedure Laterality Date  . CHOLECYSTECTOMY    . COLONOSCOPY N/A 06/28/2015   Procedure: COLONOSCOPY;  Surgeon: Rogene Houston, MD;  [facility];  Service: Endoscopy;  Laterality: N/A;  155  . EYE SURGERY    . LUMBAR LAMINECTOMY Right 07/12/2014   Procedure: LUMBAR FIVE TO SACRAL ONE MICRODISCECTOMY;  Surgeon: Marybelle Killings, MD;  Location: [facility];  Service: Orthopedics;  Laterality: Right;  . TUBAL LIGATION    . VITRECTOMY 25 GAUGE WITH SCLERAL BUCKLE Left 07/29/2017   Procedure: RETINAL DETACHMENT REPAIR LEFT EYE WITH PARSPLANA VITRECTOMY, AIR FLUID EXCHANGE, ENDO LASTER, DRAINAGE OF SUBRETINAL FLUID,ENDOLASER PPV /25 GAUGE;  Surgeon: Jalene Mullet, MD;  Location: [facility];  Service: Ophthalmology;  Laterality: Left;    Allergies: Metformin  Medications: Prior to Admission medications   Medication Sig Start Date End Date Taking? Authorizing Provider  atorvastatin (LIPITOR) 10 MG tablet Take 10 mg by mouth daily. 08/17/18   [provider]  B-D ULTRAFINE III SHORT PEN 31G X 8 MM MISC USE ONE PEN NEEDLE 4 TIMES DAILY 11/19/17   Cassandria Anger, MD  Continuous Blood Gluc Sensor (FREESTYLE LIBRE SENSOR SYSTEM) MISC Use one sensor every 10 days. 10/14/17   Cassandria Anger, MD  glipiZIDE (GLUCOTROL XL) 10 MG 24 hr tablet TAKE 1 TABLET BY MOUTH ONCE DAILY FOR SUGARS 04/04/19   [provider]  HYDROcodone-acetaminophen (NORCO/VICODIN) 5-325 MG tablet Take 1 tablet by mouth every 6 (six) hours as needed for moderate pain. 04/22/19   [name], MD  insulin  lispro (HUMALOG KWIKPEN) 100 UNIT/ML KiwkPen Inject 0.22-0.28 mLs (22-28 Units total) into the skin 3 (three) times daily with meals. 01/19/18   Cassandria Anger, MD  latanoprost (XALATAN) 0.005 % ophthalmic solution Place 1 drop into both eyes at bedtime.    [provider]  lisinopril (PRINIVIL,ZESTRIL) 10 MG tablet TAKE 1 TABLET BY MOUTH ONCE DAILY Patient not taking: Reported on 04/07/2019 04/29/17   Cassandria Anger, MD  Multiple Vitamins-Minerals (PRESERVISION AREDS PO) Take by mouth 2 (two) times daily.    [provider]   [facility] VERIO test strip USE 1 STRIP TO CHECK GLUCOSE 4 TIMES DAILY 09/29/17   Cassandria Anger, MD  PARoxetine (PAXIL) 30 MG tablet Take 30 mg by mouth every morning. 07/12/18   [provider]  Vitamin D, Ergocalciferol, (DRISDOL) 50000 units CAPS capsule Take 50,000 Units by mouth once a week. Mondays 08/17/18   [provider]     Family History  Problem Relation Age of Onset  . Hypertension Mother   . Stroke Mother   . Cancer Father        brain tumor  . Diabetes Father     Social History   Socioeconomic History  . Marital status: Married    Spouse name: Not on file  . Number of children: Not on file  . Years of education: Not on file  . Highest education level: Not on file  Occupational History  . Not on file  Social Needs  . Financial resource strain: Not on file  . Food insecurity:    Worry: Not on file    Inability: Not on file  . Transportation needs:    Medical: Not on file    Non-medical: Not on file  Tobacco Use  . Smoking status: Current Some Day Smoker    Packs/day: 1.00    Years: 10.00    Pack years: 10.00    Types: Cigarettes  . Smokeless tobacco: Never Used  . Tobacco comment:  1/2 pack a day since age 74  Substance and Sexual Activity  . Alcohol use: No    Alcohol/week: 0.0 standard drinks  . Drug use: No  . Sexual activity: Yes  Lifestyle  . Physical activity:    Days per week: Not on file    Minutes per session: Not on file  . Stress: Not on file  Relationships  . Social connections:    Talks on phone: Not on file    Gets together: Not on file    Attends religious service: Not on file    Active member of club or organization: Not on file    Attends meetings of clubs or organizations: Not on file    Relationship status: Not on file  Other Topics Concern  . Not on file  Social History Narrative  . Not on file    Review of Systems  All other systems reviewed and are negative.   Review of Systems: A 12 point  ROS discussed and pertinent positives are indicated in the HPI above.  All other systems are negative.  Physical Exam No direct physical exam was performed (except for noted visual exam findings with Video Visits).    Vital Signs: There were no vitals taken for this visit.  Imaging: Dg Chest 2 View  Result Date: 03/28/2019 CLINICAL DATA:  Dry cough for several weeks. EXAM: CHEST - 2 VIEW COMPARISON:  Single-view of the chest 12/19/2018. PA and lateral chest 02/19/2018. FINDINGS: The lungs are clear.  Heart size is normal. No pneumothorax or pleural fluid. No acute or focal bony abnormality. Thoracic spondylosis noted. IMPRESSION: No acute disease. Electronically Signed   By: Inge Rise M.D.   On: 03/28/2019 12:30   Mr Lumbar Spine W Wo Contrast  Addendum Date: 04/15/2019   ADDENDUM REPORT: 04/15/2019 10:59 ADDENDUM: Study discussed by telephone with Dr. Rodell Perna on 04/15/2019 at 1054 hours. Electronically Signed   By: Genevie Ann M.D.   On: 04/15/2019 10:59   Result Date: 04/15/2019 CLINICAL DATA:  68 year old female with extreme low back pain for 1 month greater on the left. Prior lumbar surgery in 2015. EXAM: MRI LUMBAR SPINE WITHOUT AND WITH CONTRAST TECHNIQUE: Multiplanar and multiecho pulse sequences of the lumbar spine were obtained without and with intravenous contrast. CONTRAST:  9 milliliters Gadavist COMPARISON:  Lumbar MRI 09/12/2014. FINDINGS: Segmentation: Lumbar segmentation appears to be normal and is the same numbering system used in 2015. Alignment:  Stable since 2015. Vertebrae: Diffusely abnormal replacement of bone marrow throughout the visible spine and pelvis, new since 2015. Associated heterogeneous marrow space enhancement and STIR hyperintensity. There is extraosseous extension of enhancing soft tissue from the anterior and left lateral L2 vertebral body which is mildly compressed (loss of height 15-20%). This is anterior to the left L2 neural foramen, but might involve the  extraforaminal course of the nerve, uncertain. There is associated mild lateral displacement of the adjacent left psoas muscle, which does not appear infiltrated at this time. There is no associated inflammatory stranding in the adjacent retroperitoneal fat, or the muscle. No other extraosseous or convincing epidural extension is identified. No other pathologic fracture identified. Conus medullaris and cauda equina: Conus extends to the T12-L1 level. No lower spinal cord or conus signal abnormality. No abnormal intradural enhancement. No dural thickening or nerve root enlargement identified. Paraspinal and other soft tissues: No retroperitoneal lymphadenopathy. Negative visible abdominal viscera. There is severe diverticulosis of the large bowel in the visible pelvis. Disc levels: Lumbar spine degeneration which has not significantly changed since 2015 including a right lateral recess disc herniation with mass effect on the right S1 and S2 nerve roots in the lateral recess at L5-S1 (series 8, image 8 and series 6, image 33). IMPRESSION: 1. Diffuse marrow replacement throughout the visible spine and pelvis since 2015, and mild pathologic fracture of the left L2 vertebral body where there is lateral paraspinal soft tissue extension of tumor. Top differential considerations include metastatic disease and multiple myeloma. 2. No intradural metastatic disease identified. 3. Chronic right lateral recess disc herniation at L5-S1. Electronically Signed: By: Genevie Ann M.D. On: 04/15/2019 10:49   Xr Lumbar Spine 2-3 Views  Result Date: 04/14/2019 Three-view x-rays lumbar spine obtained and reviewed.  Lateral flexion-extension shows no listhesis there is a 50% narrowing at L5-S1 compared to previous imaging in 2015 radiographs and MRI scan. Impression: Negative for acute changes.  Evidence of disc space narrowing at L5-S1.   Labs:  CBC: Recent Labs    12/19/18 0506 12/20/18 0438 12/23/18 0431 04/21/19 1049  WBC  6.0 5.4 4.2 4.7  HGB 10.0* 8.9* 9.9* 11.0*  HCT 31.5* 28.4* 30.6* 32.7*  PLT 146* 147* 161 151    COAGS: No results for input(s): INR, APTT in the last 8760 hours.  BMP: Recent Labs    12/21/18 0451 12/22/18 0620 12/23/18 0431 04/15/19 1002 04/21/19 1049  NA 138 139 139  --  138  K 4.3 3.9 4.0  --  3.5  CL 107  106 106  --  105  CO2 23 26 27   --  28  GLUCOSE 230* 245* 174*  --  130*  BUN 29* 27* 24*  --  12  CALCIUM 8.9 9.0 8.6*  --  9.2  CREATININE 1.95* 1.94* 1.87* 0.90 0.75  GFRNONAA 26* 26* 27*  --  >60  GFRAA 30* 30* 31*  --  >60    LIVER FUNCTION TESTS: Recent Labs    04/21/19 1049  BILITOT 0.3  AST 12*  ALT 13  ALKPHOS 123  PROT 8.5*  ALBUMIN 3.3*    TUMOR MARKERS: No results for input(s): AFPTM, CEA, CA199, CHROMGRNA in the last 8760 hours.  Assessment and Plan:  68 yo female with a diffuse infiltrative process throughout her spine complicated by a pathologic fracture at L2 and periostial stretching on the left at the same level which is likely contributing to her severe pain.  Differential considerations include multiple myeloma and metastatic disease.   We discussed the risks, benefits and alternatives to performing an OsteoCool procedure (RFA + cement augmentation) and concurrent transpedicular biopsy to both treat her fracture, pain and establish a tissue diagnosis.  She understands and would like to proceed as soon as possible.   Thank you for this interesting consult.  I greatly enjoyed meeting Vicki Moon and look forward to participating in their care.  A copy of this report was sent to the requesting provider on this date.  Electronically Signed: Jacqulynn Cadet 04/22/2019, 3:58 PM   I spent a total of  30 Minutes   in remote  clinical consultation, greater than 50% of which was counseling/coordinating care for L2 compression fracture and diffuse marrow abnormality.    Visit type: Audio only (telephone). Audio (no video) only due to  patient not interested in learning how to video conference, she preferred audio only. . Alternative for in-person consultation at [facility], [address] [place], [place], [place]. This visit type was conducted due to national recommendations for restrictions regarding the COVID-19 Pandemic (e.g. social distancing).  This format is felt to be most appropriate for this patient at this time.  All issues noted in this document were discussed and addressed.

## 2019-04-25 ENCOUNTER — Other Ambulatory Visit (HOSPITAL_COMMUNITY): Payer: Self-pay | Admitting: Interventional Radiology

## 2019-04-25 ENCOUNTER — Other Ambulatory Visit: Payer: Self-pay

## 2019-04-25 ENCOUNTER — Ambulatory Visit (HOSPITAL_COMMUNITY)
Admission: RE | Admit: 2019-04-25 | Discharge: 2019-04-25 | Disposition: A | Discharge status: Home / Self Care | Payer: Medicare Other | Source: Ambulatory Visit | Attending: Hematology & Oncology | Admitting: Hematology & Oncology

## 2019-04-25 ENCOUNTER — Encounter: Payer: Self-pay | Admitting: *Deleted

## 2019-04-25 DIAGNOSIS — R918 Other nonspecific abnormal finding of lung field: Secondary | ICD-10-CM | POA: Diagnosis not present

## 2019-04-25 DIAGNOSIS — K5730 Diverticulosis of large intestine without perforation or abscess without bleeding: Secondary | ICD-10-CM | POA: Insufficient documentation

## 2019-04-25 DIAGNOSIS — Z87891 Personal history of nicotine dependence: Secondary | ICD-10-CM | POA: Insufficient documentation

## 2019-04-25 DIAGNOSIS — M8448XA Pathological fracture, other site, initial encounter for fracture: Secondary | ICD-10-CM | POA: Insufficient documentation

## 2019-04-25 LAB — IMMUNOFIXATION REFLEX, SERUM
IgA: 37 mg/dL — ABNORMAL LOW (ref 87–352)
IgG (Immunoglobin G), Serum: 4101 mg/dL — ABNORMAL HIGH (ref 586–1602)
IgM (Immunoglobulin M), Srm: 39 mg/dL (ref 26–217)

## 2019-04-25 LAB — PROTEIN ELECTROPHORESIS, SERUM, WITH REFLEX
A/G Ratio: 0.7 (ref 0.7–1.7)
Albumin ELP: 3.3 g/dL (ref 2.9–4.4)
Alpha-1-Globulin: 0.3 g/dL (ref 0.0–0.4)
Alpha-2-Globulin: 0.9 g/dL (ref 0.4–1.0)
Beta Globulin: 1.1 g/dL (ref 0.7–1.3)
Gamma Globulin: 2.7 g/dL — ABNORMAL HIGH (ref 0.4–1.8)
Globulin, Total: 4.9 g/dL — ABNORMAL HIGH (ref 2.2–3.9)
M-Spike, %: 2.5 g/dL — ABNORMAL HIGH
SPEP Interpretation: 0
Total Protein ELP: 8.2 g/dL (ref 6.0–8.5)

## 2019-04-25 MED ORDER — IOHEXOL 300 MG/ML  SOLN
100.0000 mL | Freq: Once | INTRAMUSCULAR | Status: AC | PRN
  Administered 2019-04-25: 100 mL via INTRAVENOUS

## 2019-04-25 NOTE — Progress Notes (Signed)
Call - the CT scan does not show any lung cancer or other obvious cancer. I am pretty sure that you have multiple myeloma, which we can treat very successfully!!! The biopsy is key!! You may need a bone marrow biopsy. Laurey Arrow  Called the patient and notified her of the above. She is currently scheduled for her next procedure on Thursday and had some basic questions. She also mentions having a pimple on her face that she's afraid might be infected. Instructed her to call her PCP regarding this for continued follow up. She agrees.

## 2019-04-27 ENCOUNTER — Other Ambulatory Visit: Payer: Self-pay | Admitting: Radiology

## 2019-04-28 ENCOUNTER — Other Ambulatory Visit (HOSPITAL_COMMUNITY): Payer: Self-pay | Admitting: Interventional Radiology

## 2019-04-28 ENCOUNTER — Other Ambulatory Visit: Payer: Self-pay

## 2019-04-28 ENCOUNTER — Ambulatory Visit (HOSPITAL_COMMUNITY)
Admission: RE | Admit: 2019-04-28 | Discharge: 2019-04-28 | Disposition: A | Discharge status: Home / Self Care | Payer: Medicare Other | Source: Ambulatory Visit | Attending: Interventional Radiology | Admitting: Interventional Radiology

## 2019-04-28 ENCOUNTER — Ambulatory Visit (HOSPITAL_COMMUNITY)
Admission: RE | Admit: 2019-04-28 | Discharge: 2019-04-28 | Disposition: A | Discharge status: Home / Self Care | Payer: Medicare Other | Source: Ambulatory Visit | Attending: Hematology & Oncology | Admitting: Hematology & Oncology

## 2019-04-28 ENCOUNTER — Telehealth: Payer: Self-pay | Admitting: Hematology & Oncology

## 2019-04-28 ENCOUNTER — Encounter (HOSPITAL_COMMUNITY): Payer: Self-pay

## 2019-04-28 ENCOUNTER — Telehealth: Payer: Self-pay | Admitting: *Deleted

## 2019-04-28 DIAGNOSIS — F17210 Nicotine dependence, cigarettes, uncomplicated: Secondary | ICD-10-CM | POA: Insufficient documentation

## 2019-04-28 DIAGNOSIS — M8448XA Pathological fracture, other site, initial encounter for fracture: Secondary | ICD-10-CM

## 2019-04-28 DIAGNOSIS — Z9851 Tubal ligation status: Secondary | ICD-10-CM | POA: Diagnosis not present

## 2019-04-28 DIAGNOSIS — Z9049 Acquired absence of other specified parts of digestive tract: Secondary | ICD-10-CM | POA: Insufficient documentation

## 2019-04-28 DIAGNOSIS — Z833 Family history of diabetes mellitus: Secondary | ICD-10-CM | POA: Diagnosis not present

## 2019-04-28 DIAGNOSIS — Z794 Long term (current) use of insulin: Secondary | ICD-10-CM | POA: Insufficient documentation

## 2019-04-28 DIAGNOSIS — E119 Type 2 diabetes mellitus without complications: Secondary | ICD-10-CM | POA: Insufficient documentation

## 2019-04-28 DIAGNOSIS — Z79899 Other long term (current) drug therapy: Secondary | ICD-10-CM | POA: Insufficient documentation

## 2019-04-28 DIAGNOSIS — S32029A Unspecified fracture of second lumbar vertebra, initial encounter for closed fracture: Secondary | ICD-10-CM

## 2019-04-28 DIAGNOSIS — Z888 Allergy status to other drugs, medicaments and biological substances status: Secondary | ICD-10-CM | POA: Diagnosis not present

## 2019-04-28 DIAGNOSIS — S32020A Wedge compression fracture of second lumbar vertebra, initial encounter for closed fracture: Secondary | ICD-10-CM

## 2019-04-28 DIAGNOSIS — H409 Unspecified glaucoma: Secondary | ICD-10-CM | POA: Diagnosis not present

## 2019-04-28 DIAGNOSIS — S32000B Wedge compression fracture of unspecified lumbar vertebra, initial encounter for open fracture: Secondary | ICD-10-CM

## 2019-04-28 DIAGNOSIS — C9000 Multiple myeloma not having achieved remission: Secondary | ICD-10-CM | POA: Diagnosis not present

## 2019-04-28 DIAGNOSIS — D369 Benign neoplasm, unspecified site: Secondary | ICD-10-CM | POA: Diagnosis not present

## 2019-04-28 HISTORY — PX: IR KYPHO LUMBAR INC FX REDUCE BONE BX UNI/BIL CANNULATION INC/IMAGING: IMG5519

## 2019-04-28 HISTORY — PX: IR BONE TUMOR(S)RF ABLATION: IMG2284

## 2019-04-28 LAB — CBC WITH DIFFERENTIAL/PLATELET
Abs Immature Granulocytes: 0.07 10*3/uL (ref 0.00–0.07)
Basophils Absolute: 0 10*3/uL (ref 0.0–0.1)
Basophils Relative: 1 %
Eosinophils Absolute: 0.1 10*3/uL (ref 0.0–0.5)
Eosinophils Relative: 2 %
HCT: 32.7 % — ABNORMAL LOW (ref 36.0–46.0)
Hemoglobin: 10.9 g/dL — ABNORMAL LOW (ref 12.0–15.0)
Immature Granulocytes: 2 %
Lymphocytes Relative: 35 %
Lymphs Abs: 1.3 10*3/uL (ref 0.7–4.0)
MCH: 33.1 pg (ref 26.0–34.0)
MCHC: 33.3 g/dL (ref 30.0–36.0)
MCV: 99.4 fL (ref 80.0–100.0)
Monocytes Absolute: 0.4 10*3/uL (ref 0.1–1.0)
Monocytes Relative: 10 %
Neutro Abs: 1.9 10*3/uL (ref 1.7–7.7)
Neutrophils Relative %: 50 %
Platelets: 142 10*3/uL — ABNORMAL LOW (ref 150–400)
RBC: 3.29 MIL/uL — ABNORMAL LOW (ref 3.87–5.11)
RDW: 15.9 % — ABNORMAL HIGH (ref 11.5–15.5)
WBC: 3.8 10*3/uL — ABNORMAL LOW (ref 4.0–10.5)
nRBC: 0.5 % — ABNORMAL HIGH (ref 0.0–0.2)

## 2019-04-28 LAB — GLUCOSE, CAPILLARY
Glucose-Capillary: 115 mg/dL — ABNORMAL HIGH (ref 70–99)
Glucose-Capillary: 50 mg/dL — ABNORMAL LOW (ref 70–99)
Glucose-Capillary: 53 mg/dL — ABNORMAL LOW (ref 70–99)
Glucose-Capillary: 84 mg/dL (ref 70–99)

## 2019-04-28 LAB — PROTIME-INR
INR: 1.1 (ref 0.8–1.2)
Prothrombin Time: 13.6 seconds (ref 11.4–15.2)

## 2019-04-28 MED ORDER — MIDAZOLAM HCL 2 MG/2ML IJ SOLN
INTRAMUSCULAR | Status: AC
  Filled 2019-04-28: qty 6

## 2019-04-28 MED ORDER — HYDROMORPHONE HCL 1 MG/ML IJ SOLN: 1.0000 mg | INTRAMUSCULAR | Status: DC | PRN

## 2019-04-28 MED ORDER — CEFAZOLIN SODIUM-DEXTROSE 2-4 GM/100ML-% IV SOLN
INTRAVENOUS | Status: AC
  Filled 2019-04-28: qty 100

## 2019-04-28 MED ORDER — KETOROLAC TROMETHAMINE 30 MG/ML IJ SOLN
INTRAMUSCULAR | Status: AC
  Filled 2019-04-28: qty 1

## 2019-04-28 MED ORDER — KETOROLAC TROMETHAMINE 60 MG/2ML IM SOLN
INTRAMUSCULAR | Status: AC | PRN
  Administered 2019-04-28: 30 mg via INTRAMUSCULAR

## 2019-04-28 MED ORDER — MIDAZOLAM HCL 2 MG/2ML IJ SOLN
INTRAMUSCULAR | Status: AC | PRN
  Administered 2019-04-28 (×4): 1 mg via INTRAVENOUS

## 2019-04-28 MED ORDER — SODIUM CHLORIDE 0.9 % IV SOLN
INTRAVENOUS | Status: DC
  Administered 2019-04-28: 09:00:00 via INTRAVENOUS

## 2019-04-28 MED ORDER — OXYCODONE-ACETAMINOPHEN 7.5-325 MG PO TABS
1.0000 | ORAL_TABLET | ORAL | Status: DC | PRN
  Filled 2019-04-28: qty 1

## 2019-04-28 MED ORDER — LIDOCAINE HCL (PF) 1 % IJ SOLN
INTRAMUSCULAR | Status: AC | PRN
  Administered 2019-04-28: 5 mL
  Administered 2019-04-28: 10 mL

## 2019-04-28 MED ORDER — FENTANYL CITRATE (PF) 100 MCG/2ML IJ SOLN
INTRAMUSCULAR | Status: AC | PRN
  Administered 2019-04-28 (×4): 50 ug via INTRAVENOUS

## 2019-04-28 MED ORDER — IOHEXOL 300 MG/ML  SOLN
50.0000 mL | Freq: Once | INTRAMUSCULAR | Status: AC | PRN
  Administered 2019-04-28: 20 mL

## 2019-04-28 MED ORDER — FENTANYL CITRATE (PF) 100 MCG/2ML IJ SOLN
INTRAMUSCULAR | Status: AC
  Filled 2019-04-28: qty 4

## 2019-04-28 MED ORDER — LIDOCAINE HCL (PF) 1 % IJ SOLN
INTRAMUSCULAR | Status: AC
  Filled 2019-04-28: qty 30

## 2019-04-28 MED ORDER — CEFAZOLIN SODIUM-DEXTROSE 2-4 GM/100ML-% IV SOLN
2.0000 g | INTRAVENOUS | Status: AC
  Administered 2019-04-28: 10:00:00 2 g via INTRAVENOUS
  Filled 2019-04-28: qty 100

## 2019-04-28 NOTE — Progress Notes (Signed)
Called Donald Pore, pt's spouse, to confirm he was the pt's ride today and to given him an update on pt status.  No answer, but left a voicemail for him to call back.

## 2019-04-28 NOTE — Telephone Encounter (Signed)
-----  Message from [name], MD sent at 04/28/2019 11:11 AM EDT ----- I left a message on her phone answering machine.  I told her that she has multiple myeloma.  I told her that this is clearly the best disease that she could have considering the options.  We can take care of this quite nicely.  We will have to get her back in quickly to talk to her.  I am not sure exactly what we can use to treat her with right now.  However, I think that we should be able to get a very good response with what we want to use.  pete

## 2019-04-28 NOTE — H&P (Signed)
Chief Complaint: Patient was seen in consultation today for compression/pathologic fracture of L2  Referring Physician(s): [place]  Supervising Physician: Jacqulynn Cadet  Patient Status: [facility] - Out-pt  History of Present Illness: Vicki Moon is a 68 y.o. female with history of DM2, cholecystectomy, and herniated lumbar disc who presents with recent findings of infiltrative process to the spine with pathologic fracture at L2.  Findings are concerning for multiple myeloma vs. Metastatic disease.  She was seen by Dr. [name] who has referred her to Interventional Radiology for [facility] procedure with biopsy. She was seen via Telehealth consultation by Dr. Laurence Ferrari who has explained procedure, risks, and benefits.  Patient elects to proceed and presents for procedure in [facility] Radiology today.  She denies new complaints or concerns. She continues to have severe pain in her lower back.   She has been NPO.  She does not take blood thinners.   Past Medical History:  Diagnosis Date   Diabetes mellitus without complication (HCC)    Type 2 IDDM x 5 years   Glaucoma    Herniated lumbar intervertebral disc 06/2014   PONV (postoperative nausea and vomiting)     Past Surgical History:  Procedure Laterality Date   CHOLECYSTECTOMY     COLONOSCOPY N/A 06/28/2015   Procedure: COLONOSCOPY;  Surgeon: Rogene Houston, MD;  Location: [facility];  Service: Endoscopy;  Laterality: N/A;  155   EYE SURGERY     IR RADIOLOGIST EVAL & MGMT  04/22/2019   LUMBAR LAMINECTOMY Right 07/12/2014   Procedure: LUMBAR FIVE TO SACRAL ONE MICRODISCECTOMY;  Surgeon: Marybelle Killings, MD;  Location: [place];  Service: Orthopedics;  Laterality: Right;   TUBAL LIGATION     VITRECTOMY 25 GAUGE WITH SCLERAL BUCKLE Left 07/29/2017   Procedure: RETINAL DETACHMENT REPAIR LEFT EYE WITH PARSPLANA VITRECTOMY, AIR FLUID EXCHANGE, ENDO LASTER, DRAINAGE OF SUBRETINAL FLUID,ENDOLASER PPV /25 GAUGE;  Surgeon:  Jalene Mullet, MD;  Location: [place];  Service: Ophthalmology;  Laterality: Left;    Allergies: Metformin  Medications: Prior to Admission medications   Medication Sig Start Date End Date Taking? Authorizing Provider  atorvastatin (LIPITOR) 10 MG tablet Take 10 mg by mouth daily. 08/17/18   [provider]  B-D ULTRAFINE III SHORT PEN 31G X 8 MM MISC USE ONE PEN NEEDLE 4 TIMES DAILY 11/19/17   Cassandria Anger, MD  Continuous Blood Gluc Sensor (FREESTYLE LIBRE SENSOR SYSTEM) MISC Use one sensor every 10 days. 10/14/17   Cassandria Anger, MD  glipiZIDE (GLUCOTROL XL) 10 MG 24 hr tablet TAKE 1 TABLET BY MOUTH ONCE DAILY FOR SUGARS 04/04/19   [provider]  HYDROcodone-acetaminophen (NORCO/VICODIN) 5-325 MG tablet Take 1 tablet by mouth every 6 (six) hours as needed for moderate pain. 04/22/19   [name], MD  insulin glargine (LANTUS) 100 UNIT/ML injection Inject 30 Units into the skin at bedtime.    [provider]  insulin lispro (HUMALOG KWIKPEN) 100 UNIT/ML KiwkPen Inject 0.22-0.28 mLs (22-28 Units total) into the skin 3 (three) times daily with meals. 01/19/18   Cassandria Anger, MD  latanoprost (XALATAN) 0.005 % ophthalmic solution Place 1 drop into both eyes at bedtime.    [provider]  lisinopril (PRINIVIL,ZESTRIL) 10 MG tablet TAKE 1 TABLET BY MOUTH ONCE DAILY 04/29/17   Cassandria Anger, MD  Multiple Vitamins-Minerals (PRESERVISION AREDS PO) Take by mouth 2 (two) times daily.    [provider]  [facility] VERIO test strip USE 1 STRIP  TO CHECK GLUCOSE 4 TIMES DAILY 09/29/17   Cassandria Anger, MD  PARoxetine (PAXIL) 30 MG tablet Take 30 mg by mouth every morning. 07/12/18   [provider]  Vitamin D, Ergocalciferol, (DRISDOL) 50000 units CAPS capsule Take 50,000 Units by mouth once a week. Mondays 08/17/18   [provider]     Family History  Problem Relation Age of Onset   Hypertension  Mother    Stroke Mother    Cancer Father        brain tumor   Diabetes Father     Social History   Socioeconomic History   Marital status: Married    Spouse name: Not on file   Number of children: Not on file   Years of education: Not on file   Highest education level: Not on file  Occupational History   Not on file  Social Needs   Financial resource strain: Not on file   Food insecurity:    Worry: Not on file    Inability: Not on file   Transportation needs:    Medical: Not on file    Non-medical: Not on file  Tobacco Use   Smoking status: Current Some Day Smoker    Packs/day: 1.00    Years: 10.00    Pack years: 10.00    Types: Cigarettes   Smokeless tobacco: Never Used   Tobacco comment:  1/2 pack a day since age 46  Substance and Sexual Activity   Alcohol use: No    Alcohol/week: 0.0 standard drinks   Drug use: No   Sexual activity: Yes  Lifestyle   Physical activity:    Days per week: Not on file    Minutes per session: Not on file   Stress: Not on file  Relationships   Social connections:    Talks on phone: Not on file    Gets together: Not on file    Attends religious service: Not on file    Active member of club or organization: Not on file    Attends meetings of clubs or organizations: Not on file    Relationship status: Not on file  Other Topics Concern   Not on file  Social History Narrative   Not on file     Review of Systems: A 12 point ROS discussed and pertinent positives are indicated in the HPI above.  All other systems are negative.  Review of Systems  Constitutional: Negative for fatigue and fever.  Respiratory: Negative for cough and shortness of breath.   Cardiovascular: Negative for chest pain.  Gastrointestinal: Negative for abdominal pain.  Musculoskeletal: Positive for back pain.  Psychiatric/Behavioral: Negative for behavioral problems and confusion.    Vital Signs: There were no vitals taken for this  visit.  Physical Exam Vitals signs and nursing note reviewed.  Constitutional:      Appearance: Normal appearance.  HENT:     Mouth/Throat:     Mouth: Mucous membranes are moist.     Pharynx: Oropharynx is clear.  Cardiovascular:     Rate and Rhythm: Normal rate and regular rhythm.     Heart sounds: No murmur. No friction rub. No gallop.   Pulmonary:     Effort: Pulmonary effort is normal. No respiratory distress.     Breath sounds: Normal breath sounds.  Musculoskeletal:        General: Tenderness (point tenderness, L>R in lumbar area) present.  Skin:    General: Skin is warm and dry.  Neurological:  General: No focal deficit present.     Mental Status: She is alert and oriented to person, place, and time. Mental status is at baseline.  Psychiatric:        Mood and Affect: Mood normal.        Behavior: Behavior normal.        Thought Content: Thought content normal.        Judgment: Judgment normal.      MD Evaluation Airway: WNL Heart: WNL Abdomen: WNL Chest/ Lungs: WNL ASA  Classification: 3 Mallampati/Airway Score: One   Imaging: Ct Chest W Contrast  Result Date: 04/25/2019 CLINICAL DATA:  Pathological lumbar fracture, multiple myeloma workup, evaluate for metastatic malignancy EXAM: CT CHEST, ABDOMEN, AND PELVIS WITH CONTRAST TECHNIQUE: Multidetector CT imaging of the chest, abdomen and pelvis was performed following the standard protocol during bolus administration of intravenous contrast. CONTRAST:  130m OMNIPAQUE IOHEXOL 300 MG/ML SOLN, additional oral enteric contrast COMPARISON:  02/16/2017 FINDINGS: CT CHEST FINDINGS Cardiovascular: No significant vascular findings. Normal heart size. No pericardial effusion. Mediastinum/Nodes: No enlarged mediastinal, hilar, or axillary lymph nodes. Thyroid gland, trachea, and esophagus demonstrate no significant findings. Lungs/Pleura: Tiny centrilobular nodules. No pleural effusion or pneumothorax. Musculoskeletal: No  chest wall mass. CT ABDOMEN PELVIS FINDINGS Hepatobiliary: No focal liver abnormality is seen. Status post cholecystectomy. No biliary dilatation. Pancreas: Unremarkable. No pancreatic ductal dilatation or surrounding inflammatory changes. Spleen: Normal in size without focal abnormality. Adrenals/Urinary Tract: Stable, benign fullness of the left adrenal gland without discrete lesion. Kidneys are normal, without renal calculi, focal lesion, or hydronephrosis. Bladder is unremarkable. Stomach/Bowel: Stomach is within normal limits. Appendix appears normal. No evidence of bowel wall thickening, distention, or inflammatory changes. Pancolonic diverticulosis. Vascular/Lymphatic: No significant vascular findings are present. No enlarged abdominal or pelvic lymph nodes. Reproductive: No mass or other abnormality. Other: No abdominal wall hernia or abnormality. No abdominopelvic ascites. Musculoskeletal: Redemonstrated lytic lesion of the superior aspect of L2 with an associated pathologic superior endplate fracture, better delineated by prior contrast-enhanced MRI. There are additional suspicious lytic lesions of the left aspect of the S1 segment (series 2, image 89, series 4, image 126), inferior endplate of L5 (series 5, image 116) IMPRESSION: 1. Redemonstrated lytic lesion of the superior aspect of L2 with an associated pathologic superior endplate fracture, better delineated by prior contrast-enhanced MRI. There are additional suspicious lytic lesions of the left aspect of the S1 segment (series 2, image 89, series 4, image 126), inferior endplate of L5 (series 5, image 116) 2. No additional evidence of malignancy in the chest, abdomen, or pelvis. 3. Tiny centrilobular nodules of the lungs, likely benign sequelae of infection or inflation, including smoking-related respiratory bronchiolitis. 4.  Other chronic and incidental findings as above. Electronically Signed   By: AEddie CandleM.D.   On: 04/25/2019 11:45   Mr  Lumbar Spine W Wo Contrast  Addendum Date: 04/15/2019   ADDENDUM REPORT: 04/15/2019 10:59 ADDENDUM: Study discussed by telephone with Dr. MRodell Pernaon 04/15/2019 at 1054 hours. Electronically Signed   By: HGenevie AnnM.D.   On: 04/15/2019 10:59   Result Date: 04/15/2019 CLINICAL DATA:  68year old female with extreme low back pain for 1 month greater on the left. Prior lumbar surgery in 2015. EXAM: MRI LUMBAR SPINE WITHOUT AND WITH CONTRAST TECHNIQUE: Multiplanar and multiecho pulse sequences of the lumbar spine were obtained without and with intravenous contrast. CONTRAST:  9 milliliters Gadavist COMPARISON:  Lumbar MRI 09/12/2014. FINDINGS: Segmentation: Lumbar segmentation appears to be normal  and is the same numbering system used in 2015. Alignment:  Stable since 2015. Vertebrae: Diffusely abnormal replacement of bone marrow throughout the visible spine and pelvis, new since 2015. Associated heterogeneous marrow space enhancement and STIR hyperintensity. There is extraosseous extension of enhancing soft tissue from the anterior and left lateral L2 vertebral body which is mildly compressed (loss of height 15-20%). This is anterior to the left L2 neural foramen, but might involve the extraforaminal course of the nerve, uncertain. There is associated mild lateral displacement of the adjacent left psoas muscle, which does not appear infiltrated at this time. There is no associated inflammatory stranding in the adjacent retroperitoneal fat, or the muscle. No other extraosseous or convincing epidural extension is identified. No other pathologic fracture identified. Conus medullaris and cauda equina: Conus extends to the T12-L1 level. No lower spinal cord or conus signal abnormality. No abnormal intradural enhancement. No dural thickening or nerve root enlargement identified. Paraspinal and other soft tissues: No retroperitoneal lymphadenopathy. Negative visible abdominal viscera. There is severe diverticulosis of the  large bowel in the visible pelvis. Disc levels: Lumbar spine degeneration which has not significantly changed since 2015 including a right lateral recess disc herniation with mass effect on the right S1 and S2 nerve roots in the lateral recess at L5-S1 (series 8, image 8 and series 6, image 33). IMPRESSION: 1. Diffuse marrow replacement throughout the visible spine and pelvis since 2015, and mild pathologic fracture of the left L2 vertebral body where there is lateral paraspinal soft tissue extension of tumor. Top differential considerations include metastatic disease and multiple myeloma. 2. No intradural metastatic disease identified. 3. Chronic right lateral recess disc herniation at L5-S1. Electronically Signed: By: Genevie Ann M.D. On: 04/15/2019 10:49   Ct Abdomen Pelvis W Contrast  Result Date: 04/25/2019 CLINICAL DATA:  Pathological lumbar fracture, multiple myeloma workup, evaluate for metastatic malignancy EXAM: CT CHEST, ABDOMEN, AND PELVIS WITH CONTRAST TECHNIQUE: Multidetector CT imaging of the chest, abdomen and pelvis was performed following the standard protocol during bolus administration of intravenous contrast. CONTRAST:  171m OMNIPAQUE IOHEXOL 300 MG/ML SOLN, additional oral enteric contrast COMPARISON:  02/16/2017 FINDINGS: CT CHEST FINDINGS Cardiovascular: No significant vascular findings. Normal heart size. No pericardial effusion. Mediastinum/Nodes: No enlarged mediastinal, hilar, or axillary lymph nodes. Thyroid gland, trachea, and esophagus demonstrate no significant findings. Lungs/Pleura: Tiny centrilobular nodules. No pleural effusion or pneumothorax. Musculoskeletal: No chest wall mass. CT ABDOMEN PELVIS FINDINGS Hepatobiliary: No focal liver abnormality is seen. Status post cholecystectomy. No biliary dilatation. Pancreas: Unremarkable. No pancreatic ductal dilatation or surrounding inflammatory changes. Spleen: Normal in size without focal abnormality. Adrenals/Urinary Tract: Stable,  benign fullness of the left adrenal gland without discrete lesion. Kidneys are normal, without renal calculi, focal lesion, or hydronephrosis. Bladder is unremarkable. Stomach/Bowel: Stomach is within normal limits. Appendix appears normal. No evidence of bowel wall thickening, distention, or inflammatory changes. Pancolonic diverticulosis. Vascular/Lymphatic: No significant vascular findings are present. No enlarged abdominal or pelvic lymph nodes. Reproductive: No mass or other abnormality. Other: No abdominal wall hernia or abnormality. No abdominopelvic ascites. Musculoskeletal: Redemonstrated lytic lesion of the superior aspect of L2 with an associated pathologic superior endplate fracture, better delineated by prior contrast-enhanced MRI. There are additional suspicious lytic lesions of the left aspect of the S1 segment (series 2, image 89, series 4, image 126), inferior endplate of L5 (series 5, image 116) IMPRESSION: 1. Redemonstrated lytic lesion of the superior aspect of L2 with an associated pathologic superior endplate fracture, better delineated by  prior contrast-enhanced MRI. There are additional suspicious lytic lesions of the left aspect of the S1 segment (series 2, image 89, series 4, image 126), inferior endplate of L5 (series 5, image 116) 2. No additional evidence of malignancy in the chest, abdomen, or pelvis. 3. Tiny centrilobular nodules of the lungs, likely benign sequelae of infection or inflation, including smoking-related respiratory bronchiolitis. 4.  Other chronic and incidental findings as above. Electronically Signed   By: Eddie Candle M.D.   On: 04/25/2019 11:45   Xr Lumbar Spine 2-3 Views  Result Date: 04/14/2019 Three-view x-rays lumbar spine obtained and reviewed.  Lateral flexion-extension shows no listhesis there is a 50% narrowing at L5-S1 compared to previous imaging in 2015 radiographs and MRI scan. Impression: Negative for acute changes.  Evidence of disc space narrowing  at L5-S1.  Ir Radiologist Eval & Mgmt  Result Date: 04/22/2019 Please refer to notes tab for details about interventional procedure. (Op Note)   Labs:  CBC: Recent Labs    12/19/18 0506 12/20/18 0438 12/23/18 0431 04/21/19 1049  WBC 6.0 5.4 4.2 4.7  HGB 10.0* 8.9* 9.9* 11.0*  HCT 31.5* 28.4* 30.6* 32.7*  PLT 146* 147* 161 151    COAGS: Recent Labs    04/28/19 0840  INR 1.1    BMP: Recent Labs    12/21/18 0451 12/22/18 0620 12/23/18 0431 04/15/19 1002 04/21/19 1049  NA 138 139 139  --  138  K 4.3 3.9 4.0  --  3.5  CL 107 106 106  --  105  CO2 23 26 27   --  28  GLUCOSE 230* 245* 174*  --  130*  BUN 29* 27* 24*  --  12  CALCIUM 8.9 9.0 8.6*  --  9.2  CREATININE 1.95* 1.94* 1.87* 0.90 0.75  GFRNONAA 26* 26* 27*  --  >60  GFRAA 30* 30* 31*  --  >60    LIVER FUNCTION TESTS: Recent Labs    04/21/19 1049  BILITOT 0.3  AST 12*  ALT 13  ALKPHOS 123  PROT 8.5*  ALBUMIN 3.3*    TUMOR MARKERS: No results for input(s): AFPTM, CEA, CA199, CHROMGRNA in the last 8760 hours.  Assessment and Plan: Patient with past medical history of DM, herniated lumbar disc presents with complaint of acute-on-chronic back pain. MR Lumbar Spine 04/15/19 showed L2 pathologic fracture. IR consulted for Osteocool ablation with cement augmentation at the request of Dr. [name]. Case reviewed by Dr. Laurence Ferrari who approves patient for procedure and has seen her in virtual consultation.  Patient presents today in their usual state of health.  She has been NPO and is not currently on blood thinners.   Risks and benefits of Osteocool ablation procedure were discussed with the patient including, but not limited to education regarding the natural healing process of compression fractures without intervention, bleeding, infection, cement migration which may cause spinal cord damage, paralysis, pulmonary embolism or even death.  This interventional procedure involves the use of X-rays and  because of the nature of the planned procedure, it is possible that we will have prolonged use of X-ray fluoroscopy.  Potential radiation risks to you include (but are not limited to) the following: - A slightly elevated risk for cancer  several years later in life. This risk is typically less than 0.5% percent. This risk is low in comparison to the normal incidence of human cancer, which is 33% for women and 50% for men according to the [employer]. - Radiation induced  injury can include skin redness, resembling a rash, tissue breakdown / ulcers and hair loss (which can be temporary or permanent).   The likelihood of either of these occurring depends on the difficulty of the procedure and whether you are sensitive to radiation due to previous procedures, disease, or genetic conditions.   IF your procedure requires a prolonged use of radiation, you will be notified and given written instructions for further action.  It is your responsibility to monitor the irradiated area for the 2 weeks following the procedure and to notify your physician if you are concerned that you have suffered a radiation induced injury.    All of the patient's questions were answered, patient is agreeable to proceed.  Consent signed and in chart.  Thank you for this interesting consult.  I greatly enjoyed meeting IMUNIQUE SAMAD and look forward to participating in their care.  A copy of this report was sent to the requesting provider on this date.  Electronically Signed: Docia Barrier, PA 04/28/2019, 9:19 AM   I spent a total of    25 Minutes in face to face in clinical consultation, greater than 50% of which was counseling/coordinating care for L2 pathologic fracture.

## 2019-04-28 NOTE — Progress Notes (Signed)
Pt post vertebralplasty.  Pt was just up to bathroom.  Pt states it did not hurt when I got up and that's a big improvement.  Pt states at most she would rate her pain 2/10 when she got up.  Pt was pain free while sitting in bed.  Dr.McCullough at the bedside now to see pt as he requested before pt was d/c home.  Pt taken out by wheelchair to meet her husband for d/c home.  Husband is driving.  Explained d/c instructions to husband over the phone and also explained to pt.

## 2019-04-28 NOTE — Progress Notes (Signed)
Pt's husband, Josph Macho called back.  Confirmed he was pt's ride.  Gave him estimate for d/c.  Informed him we would call when pt was done with procedure and update him on d/c time as well.  Mr. Orne voiced understanding.

## 2019-04-28 NOTE — Progress Notes (Signed)
Sugar is now 82.  Pt still has some crackers and coke.  Pt states she feels fine.

## 2019-04-28 NOTE — Procedures (Signed)
Interventional Radiology Procedure Note  Procedure:  1.) Biopsy of L2 2.) RFA of L2 3.) KP of L2  Complications: None  Estimated Blood Loss: None  Recommendations: - Bedrest x 3 hrs - Pain control - DC home  Signed,  Criselda Peaches, MD

## 2019-04-28 NOTE — Discharge Instructions (Signed)
KYPHOPLASTY/VERTEBROPLASTY DISCHARGE INSTRUCTIONS  Medications: (check all that apply)     Resume all home medications as before procedure.    Continue your pain medications as prescribed as needed.  Over the next 3-5 days, decrease your pain medication as tolerated.  Over the counter medications (i.e. Tylenol, ibuprofen, and aleve) may be substituted once severe/moderate pain symptoms have subsided.   Wound Care: - Bandages may be removed the day following your procedure.  You may get your incision wet once bandages are removed.  Bandaids may be used to cover the incisions until scab formation.  Topical ointments are optional.  - If you develop a fever greater than 101 degrees, have increased skin redness at the incision sites or pus-like oozing from incisions occurring within 1 week of the procedure, contact radiology at [phone] or [phone].  - Ice pack to back for 15-20 minutes 2-3 time per day for first 2-3 days post procedure.  The ice will expedite muscle healing and help with the pain from the incisions.   Activity: - Bedrest today with limited activity for 24 hours post procedure.  - No driving for 48 hours.  - Increase your activity as tolerated after bedrest (with assistance if necessary).  - Refrain from any strenuous activity or heavy lifting (greater than 10 lbs.).   Follow up: - Contact radiology at [phone] or [phone] if any questions/concerns.  - A physician assistant from radiology will contact you in approximately 1 week.  - If a biopsy was performed at the time of your procedure, your referring physician should receive the results in usually 2-3 days.     Moderate Conscious Sedation, Adult, Care After These instructions provide you with information about caring for yourself after your procedure. Your health care provider may also give you more specific instructions. Your treatment has been planned according to current medical practices, but problems sometimes  occur. Call your health care provider if you have any problems or questions after your procedure. What can I expect after the procedure? After your procedure, it is common:  To feel sleepy for several hours.  To feel clumsy and have poor balance for several hours.  To have poor judgment for several hours.  To vomit if you eat too soon. Follow these instructions at home: For at least 24 hours after the procedure:   Do not: ? Participate in activities where you could fall or become injured. ? Drive. ? Use heavy machinery. ? Drink alcohol. ? Take sleeping pills or medicines that cause drowsiness. ? Make important decisions or sign legal documents. ? Take care of children on your own.  Rest. Eating and drinking  Follow the diet recommended by your health care provider.  If you vomit: ? Drink water, juice, or soup when you can drink without vomiting. ? Make sure you have little or no nausea before eating solid foods. General instructions  Have a responsible adult stay with you until you are awake and alert.  Take over-the-counter and prescription medicines only as told by your health care provider.  If you smoke, do not smoke without supervision.  Keep all follow-up visits as told by your health care provider. This is important. Contact a health care provider if:  You keep feeling nauseous or you keep vomiting.  You feel light-headed.  You develop a rash.  You have a fever. Get help right away if:  You have trouble breathing. This information is not intended to replace advice given to you by your health care  provider. Make sure you discuss any questions you have with your health care provider. Document Released: 09/28/2013 Document Revised: 05/12/2016 Document Reviewed: 03/29/2016 Elsevier Interactive Patient Education  2019 [employer].

## 2019-04-28 NOTE — Progress Notes (Signed)
Sugar 54.  Gave snack, crackers, peanut butter, cheese, and orange juice.  Pt alert and oriented. Talking to nurse.

## 2019-04-28 NOTE — Telephone Encounter (Signed)
lmom to inform pt of appt 5/8 at 4 pm per 5/7 sch msg

## 2019-04-29 ENCOUNTER — Inpatient Hospital Stay: Payer: Medicare Other

## 2019-04-29 ENCOUNTER — Encounter: Payer: Self-pay | Admitting: Hematology & Oncology

## 2019-04-29 ENCOUNTER — Inpatient Hospital Stay: Payer: Medicare Other | Attending: Hematology & Oncology | Admitting: Hematology & Oncology

## 2019-04-29 VITALS — BP 147/47 | HR 96 | Resp 18 | Wt 201.8 lb

## 2019-04-29 DIAGNOSIS — L0201 Cutaneous abscess of face: Secondary | ICD-10-CM | POA: Diagnosis not present

## 2019-04-29 DIAGNOSIS — M549 Dorsalgia, unspecified: Secondary | ICD-10-CM | POA: Diagnosis not present

## 2019-04-29 DIAGNOSIS — Z79899 Other long term (current) drug therapy: Secondary | ICD-10-CM

## 2019-04-29 DIAGNOSIS — C9000 Multiple myeloma not having achieved remission: Secondary | ICD-10-CM

## 2019-04-29 DIAGNOSIS — Z515 Encounter for palliative care: Secondary | ICD-10-CM | POA: Insufficient documentation

## 2019-04-29 DIAGNOSIS — L089 Local infection of the skin and subcutaneous tissue, unspecified: Secondary | ICD-10-CM

## 2019-04-29 DIAGNOSIS — Z7189 Other specified counseling: Secondary | ICD-10-CM

## 2019-04-29 DIAGNOSIS — T148XXA Other injury of unspecified body region, initial encounter: Secondary | ICD-10-CM

## 2019-04-29 DIAGNOSIS — Z794 Long term (current) use of insulin: Secondary | ICD-10-CM | POA: Diagnosis not present

## 2019-04-29 HISTORY — DX: Multiple myeloma not having achieved remission: C90.00

## 2019-04-29 HISTORY — DX: Other specified counseling: Z71.89

## 2019-04-29 MED ORDER — LENALIDOMIDE 25 MG PO CAPS: ORAL_CAPSULE | ORAL | 4 refills | Status: DC

## 2019-04-29 MED ORDER — FAMCICLOVIR 250 MG PO TABS: 250.0000 mg | ORAL_TABLET | Freq: Every day | ORAL | 8 refills | Status: DC

## 2019-04-29 MED ORDER — AMOXICILLIN-POT CLAVULANATE 875-125 MG PO TABS: 1.0000 | ORAL_TABLET | Freq: Two times a day (BID) | ORAL | 0 refills | Status: AC

## 2019-04-29 NOTE — Progress Notes (Signed)
START ON PATHWAY REGIMEN - Multiple Myeloma and Other Plasma Cell Dyscrasias     A cycle is every 21 days:     Bortezomib      Lenalidomide      Dexamethasone   **Always confirm dose/schedule in your pharmacy ordering system**  Patient Characteristics: Newly Diagnosed, Transplant Eligible, Unknown or Awaiting Test Results R-ISS Staging: III Disease Classification: Newly Diagnosed Is Patient Eligible for Transplant<= Transplant Eligible Risk Status: Unknown Intent of Therapy: Non-Curative / Palliative Intent, Discussed with Patient

## 2019-04-29 NOTE — Progress Notes (Signed)
Hematology and Oncology Follow Up Visit  Vicki Moon [id number] Dec 15, 1951 68 y.o. 04/29/2019   Principle Diagnosis:   IgG Kappa myeloma  Current Therapy:    RVD -- start on 05/09/2019  S/P Kyphoplasty at L2  XRT to lumbar spine -- to be done in [place]  Xgeva 120 mg sq q 3 months     Interim History:  Vicki Moon is back for a second office visit.  We now have a diagnosis on her.  Her lab work showed that she has IgG kappa myeloma.  She has a 2.5 g/dL M spike.  Her IgG level is 4100 mg/dL.  Her kappa light chain is 53 mg/dL.  She is already had the kyphoplasty at L2.  As always, interventional radiology has done a fantastic job.  We did do a CT scan on her.  This was tried to look for a malignancy.  Her CT scan of the chest/abdomen/pelvis all were negative for any obvious malignancy.  Of note, she does have a little bit of a superficial abscess on her chin.  This was opened and drained purulent material.  We got cultures on this.  I will see about getting her on Augmentin for this.  I talked her at length about his diagnosis.  I do believe that we are going to actually need a bone marrow biopsy and aspirate.  I really need to get cytogenetic off the s aspirate to see what her chromosomes are.  I do think that she will need a PET scan.  I need to see exactly what bones are involved.  We will also get her set up with radiation therapy up in [place], where she lives.  This would be a lot easier for her.  As far as systemic therapy is concerned, I would believe that she should do okay with Revlimid/Velcade/Decadron.  She is diabetic but I do not think that the Decadron at 20 mg a week would be a problem.  She is looking a whole lot better.  Her pain is a whole lot less now.  She is walking much better.  She has had no issues with bowels or bladder.  Overall, her performance status is ECOG 1.  Medications:  Current Outpatient Medications:  .  atorvastatin (LIPITOR) 10 MG  tablet, Take 10 mg by mouth daily., Disp: , Rfl: 5 .  B-D ULTRAFINE III SHORT PEN 31G X 8 MM MISC, USE ONE PEN NEEDLE 4 TIMES DAILY, Disp: 100 each, Rfl: 5 .  Continuous Blood Gluc Sensor ([place]) MISC, Use one sensor every 10 days., Disp: 3 each, Rfl: 2 .  glipiZIDE (GLUCOTROL XL) 10 MG 24 hr tablet, TAKE 1 TABLET BY MOUTH ONCE DAILY FOR SUGARS, Disp: , Rfl:  .  HYDROcodone-acetaminophen (NORCO/VICODIN) 5-325 MG tablet, Take 1 tablet by mouth every 6 (six) hours as needed for moderate pain., Disp: 60 tablet, Rfl: 0 .  insulin glargine (LANTUS) 100 UNIT/ML injection, Inject 30 Units into the skin at bedtime., Disp: , Rfl:  .  insulin lispro (HUMALOG KWIKPEN) 100 UNIT/ML KiwkPen, Inject 0.22-0.28 mLs (22-28 Units total) into the skin 3 (three) times daily with meals., Disp: 25 mL, Rfl: 2 .  latanoprost (XALATAN) 0.005 % ophthalmic solution, Place 1 drop into both eyes at bedtime., Disp: , Rfl:  .  lisinopril (PRINIVIL,ZESTRIL) 10 MG tablet, TAKE 1 TABLET BY MOUTH ONCE DAILY, Disp: 90 tablet, Rfl: 0 .  Multiple Vitamins-Minerals (PRESERVISION AREDS PO), Take by mouth 2 (two) times  daily., Disp: , Rfl:  .  ONETOUCH VERIO test strip, USE 1 STRIP TO CHECK GLUCOSE 4 TIMES DAILY, Disp: 150 each, Rfl: 5 .  PARoxetine (PAXIL) 30 MG tablet, Take 30 mg by mouth every morning., Disp: , Rfl: 3 .  Vitamin D, Ergocalciferol, (DRISDOL) 50000 units CAPS capsule, Take 50,000 Units by mouth once a week. Mondays, Disp: , Rfl: 1  Allergies:  Allergies  Allergen Reactions  . Metformin Nausea Only    "severe GI"    Past Medical History, Surgical history, Social history, and Family History were reviewed and updated.  Review of Systems: Review of Systems  Constitutional: Negative.   HENT:  Negative.   Eyes: Negative.   Respiratory: Negative.   Cardiovascular: Negative.   Gastrointestinal: Negative.   Endocrine: Negative.   Genitourinary: Negative.    Musculoskeletal: Positive for back  pain.  Skin: Negative.   Neurological: Negative.   Hematological: Negative.   Psychiatric/Behavioral: Negative.     Physical Exam:  weight is 201 lb 12.8 oz (91.5 kg). Her blood pressure is 147/47 (abnormal) and her pulse is 96. Her respiration is 18 and oxygen saturation is 100%.   Wt Readings from Last 3 Encounters:  04/29/19 201 lb 12.8 oz (91.5 kg)  04/21/19 198 lb (89.8 kg)  04/14/19 199 lb (90.3 kg)    Physical Exam Vitals signs reviewed.  HENT:     Head: Normocephalic and atraumatic.  Eyes:     Pupils: Pupils are equal, round, and reactive to light.  Neck:     Musculoskeletal: Normal range of motion.  Cardiovascular:     Rate and Rhythm: Normal rate and regular rhythm.     Heart sounds: Normal heart sounds.  Pulmonary:     Effort: Pulmonary effort is normal.     Breath sounds: Normal breath sounds.  Abdominal:     General: Bowel sounds are normal.     Palpations: Abdomen is soft.  Musculoskeletal: Normal range of motion.        General: No tenderness or deformity.  Lymphadenopathy:     Cervical: No cervical adenopathy.  Skin:    General: Skin is warm and dry.     Findings: No erythema or rash.  Neurological:     Mental Status: She is alert and oriented to person, place, and time.  Psychiatric:        Behavior: Behavior normal.        Thought Content: Thought content normal.        Judgment: Judgment normal.      Lab Results  Component Value Date   WBC 3.8 (L) 04/28/2019   HGB 10.9 (L) 04/28/2019   HCT 32.7 (L) 04/28/2019   MCV 99.4 04/28/2019   PLT 142 (L) 04/28/2019     Chemistry      Component Value Date/Time   NA 138 04/21/2019 1049   K 3.5 04/21/2019 1049   CL 105 04/21/2019 1049   CO2 28 04/21/2019 1049   BUN 12 04/21/2019 1049   CREATININE 0.75 04/21/2019 1049   CREATININE 0.65 01/12/2018 1004      Component Value Date/Time   CALCIUM 9.2 04/21/2019 1049   ALKPHOS 123 04/21/2019 1049   AST 12 (L) 04/21/2019 1049   ALT 13 04/21/2019  1049   BILITOT 0.3 04/21/2019 1049       Impression and Plan: Vicki Moon is a 68-year-old white female.  She has a new diagnosis of IgG kappa myeloma.  I think this is clearly the   best diagnosis that we could have gotten with her.  We could easily treat this and give her a good quality of life long time.  She is 68 years old.  I think the real question is whether or not she would be a candidate for stem cell transplantation.  We have to complete our staging.  She does need to have bone marrow biopsy and aspirate.  She does need to have a PET scan.  I will have to speak with radiation therapy up in [place].  As far as the Revlimid and the Velcade is concerned we will have to see about getting her set up for this.  She wants to try to have the Velcade done closer to home.  I certainly understand this given that this is just an injection once a week.  I spent about 45 minutes with her today.  She videotaped everything on her cell phone.  I answered all of her questions.  I encouraged her that we certainly have a lot of opportunity to get her better.  We will plan to get her back here probably in a couple weeks or so.  [name], MD 5/8/20204:37 PM

## 2019-05-02 ENCOUNTER — Telehealth: Payer: Self-pay | Admitting: *Deleted

## 2019-05-02 ENCOUNTER — Telehealth: Payer: Self-pay | Admitting: Hematology & Oncology

## 2019-05-02 ENCOUNTER — Other Ambulatory Visit: Payer: Self-pay | Admitting: *Deleted

## 2019-05-02 MED ORDER — CIPROFLOXACIN HCL 500 MG PO TABS: 500.0000 mg | ORAL_TABLET | Freq: Two times a day (BID) | ORAL | 0 refills | Status: DC

## 2019-05-02 NOTE — Telephone Encounter (Signed)
Enrolled pt into [facility] for Revlimid.  Reviewed all materials regarding Revlimid and answered all pt questions.  Faxed Rxw/Auth# to [facility] [phone]

## 2019-05-02 NOTE — Telephone Encounter (Signed)
lmom to inform pt of 5/26 appt at 10 am per 5/8 LOS

## 2019-05-02 NOTE — Telephone Encounter (Signed)
-----   Message from [name], MD sent at 05/02/2019  8:12 AM EDT ----- Call - she has another MRSA. The Augmentin will NOT work.  please call in Cipro 500 mg po bid x 10 days.Marland Kitchen  Laurey Arrow

## 2019-05-03 ENCOUNTER — Other Ambulatory Visit: Payer: Self-pay | Admitting: Radiology

## 2019-05-04 ENCOUNTER — Telehealth: Payer: Self-pay | Admitting: Pharmacist

## 2019-05-04 ENCOUNTER — Ambulatory Visit (HOSPITAL_COMMUNITY)
Admission: RE | Admit: 2019-05-04 | Discharge: 2019-05-04 | Disposition: A | Discharge status: Home / Self Care | Payer: Medicare Other | Source: Ambulatory Visit | Attending: Hematology & Oncology | Admitting: Hematology & Oncology

## 2019-05-04 ENCOUNTER — Encounter (HOSPITAL_COMMUNITY): Payer: Self-pay

## 2019-05-04 ENCOUNTER — Other Ambulatory Visit: Payer: Self-pay

## 2019-05-04 DIAGNOSIS — Z794 Long term (current) use of insulin: Secondary | ICD-10-CM | POA: Diagnosis not present

## 2019-05-04 DIAGNOSIS — F17200 Nicotine dependence, unspecified, uncomplicated: Secondary | ICD-10-CM | POA: Insufficient documentation

## 2019-05-04 DIAGNOSIS — D649 Anemia, unspecified: Secondary | ICD-10-CM | POA: Diagnosis not present

## 2019-05-04 DIAGNOSIS — B9562 Methicillin resistant Staphylococcus aureus infection as the cause of diseases classified elsewhere: Secondary | ICD-10-CM | POA: Insufficient documentation

## 2019-05-04 DIAGNOSIS — L0201 Cutaneous abscess of face: Secondary | ICD-10-CM | POA: Insufficient documentation

## 2019-05-04 DIAGNOSIS — M549 Dorsalgia, unspecified: Secondary | ICD-10-CM | POA: Insufficient documentation

## 2019-05-04 DIAGNOSIS — Z79899 Other long term (current) drug therapy: Secondary | ICD-10-CM | POA: Diagnosis not present

## 2019-05-04 DIAGNOSIS — C9000 Multiple myeloma not having achieved remission: Secondary | ICD-10-CM | POA: Diagnosis not present

## 2019-05-04 DIAGNOSIS — Z888 Allergy status to other drugs, medicaments and biological substances status: Secondary | ICD-10-CM | POA: Insufficient documentation

## 2019-05-04 DIAGNOSIS — H409 Unspecified glaucoma: Secondary | ICD-10-CM | POA: Insufficient documentation

## 2019-05-04 DIAGNOSIS — E119 Type 2 diabetes mellitus without complications: Secondary | ICD-10-CM | POA: Insufficient documentation

## 2019-05-04 LAB — CBC WITH DIFFERENTIAL/PLATELET
Abs Immature Granulocytes: 0.1 10*3/uL — ABNORMAL HIGH (ref 0.00–0.07)
Basophils Absolute: 0 10*3/uL (ref 0.0–0.1)
Basophils Relative: 1 %
Eosinophils Absolute: 0.1 10*3/uL (ref 0.0–0.5)
Eosinophils Relative: 2 %
HCT: 34.7 % — ABNORMAL LOW (ref 36.0–46.0)
Hemoglobin: 11.3 g/dL — ABNORMAL LOW (ref 12.0–15.0)
Immature Granulocytes: 2 %
Lymphocytes Relative: 32 %
Lymphs Abs: 1.4 10*3/uL (ref 0.7–4.0)
MCH: 32.4 pg (ref 26.0–34.0)
MCHC: 32.6 g/dL (ref 30.0–36.0)
MCV: 99.4 fL (ref 80.0–100.0)
Monocytes Absolute: 0.4 10*3/uL (ref 0.1–1.0)
Monocytes Relative: 10 %
Neutro Abs: 2.3 10*3/uL (ref 1.7–7.7)
Neutrophils Relative %: 53 %
Platelets: 152 10*3/uL (ref 150–400)
RBC: 3.49 MIL/uL — ABNORMAL LOW (ref 3.87–5.11)
RDW: 15.9 % — ABNORMAL HIGH (ref 11.5–15.5)
WBC: 4.2 10*3/uL (ref 4.0–10.5)
nRBC: 0 % (ref 0.0–0.2)

## 2019-05-04 LAB — AEROBIC/ANAEROBIC CULTURE W GRAM STAIN (SURGICAL/DEEP WOUND)

## 2019-05-04 LAB — GLUCOSE, CAPILLARY
Glucose-Capillary: 175 mg/dL — ABNORMAL HIGH (ref 70–99)
Glucose-Capillary: 162 mg/dL — ABNORMAL HIGH (ref 70–99)

## 2019-05-04 LAB — AEROBIC/ANAEROBIC CULTURE (SURGICAL/DEEP WOUND)

## 2019-05-04 MED ORDER — HYDROCODONE-ACETAMINOPHEN 5-325 MG PO TABS: 1.0000 | ORAL_TABLET | ORAL | Status: DC | PRN

## 2019-05-04 MED ORDER — MIDAZOLAM HCL 2 MG/2ML IJ SOLN
INTRAMUSCULAR | Status: AC | PRN
  Administered 2019-05-04 (×4): 1 mg via INTRAVENOUS

## 2019-05-04 MED ORDER — FENTANYL CITRATE (PF) 100 MCG/2ML IJ SOLN
INTRAMUSCULAR | Status: AC
  Filled 2019-05-04: qty 2

## 2019-05-04 MED ORDER — SODIUM CHLORIDE 0.9 % IV SOLN
INTRAVENOUS | Status: DC
  Administered 2019-05-04: 10:00:00 via INTRAVENOUS

## 2019-05-04 MED ORDER — FENTANYL CITRATE (PF) 100 MCG/2ML IJ SOLN
INTRAMUSCULAR | Status: AC | PRN
  Administered 2019-05-04 (×2): 50 ug via INTRAVENOUS

## 2019-05-04 MED ORDER — MIDAZOLAM HCL 2 MG/2ML IJ SOLN
INTRAMUSCULAR | Status: AC
  Filled 2019-05-04: qty 4

## 2019-05-04 MED ORDER — LIDOCAINE HCL (PF) 1 % IJ SOLN
INTRAMUSCULAR | Status: AC | PRN
  Administered 2019-05-04: 15 mL

## 2019-05-04 NOTE — Procedures (Signed)
Interventional Radiology Procedure:   Indications: Myeloma  Procedure: CT guided bone marrow biopsy  Findings: No aspirate material available.  2 cores obtained.    Complications: None     EBL: Minimal, less than 10 ml  Plan: Discharge to home in one hour.   Jaeleigh Monaco R. Anselm Pancoast, MD  Pager: [phone]

## 2019-05-04 NOTE — Discharge Instructions (Signed)
Bone Marrow Aspiration and Bone Marrow Biopsy, Adult, Care After This sheet gives you information about how to care for yourself after your procedure. Your health care provider may also give you more specific instructions. If you have problems or questions, contact your health care provider. What can I expect after the procedure? After the procedure, it is common to have:  Mild pain and tenderness.  Swelling.  Bruising. Follow these instructions at home: Puncture site care      Follow instructions from your health care provider about how to take care of the puncture site. Make sure you: ? Wash your hands with soap and water before you change your bandage (dressing). If soap and water are not available, use hand sanitizer. ? Change your dressing as told by your health care provider.  Check your puncture siteevery day for signs of infection. Check for: ? More redness, swelling, or pain. ? More fluid or blood. ? Warmth. ? Pus or a bad smell. General instructions  Take over-the-counter and prescription medicines only as told by your health care provider.  Do not take baths, swim, or use a hot tub until your health care provider approves. Ask if you can take a shower or have a sponge bath.  Return to your normal activities as told by your health care provider. Ask your health care provider what activities are safe for you.  Do not drive for 24 hours if you were given a medicine to help you relax (sedative) during your procedure.  Keep all follow-up visits as told by your health care provider. This is important. Contact a health care provider if:  Your pain is not controlled with medicine. Get help right away if:  You have a fever.  You have more redness, swelling, or pain around the puncture site.  You have more fluid or blood coming from the puncture site.  Your puncture site feels warm to the touch.  You have pus or a bad smell coming from the puncture site. These  symptoms may represent a serious problem that is an emergency. Do not wait to see if the symptoms will go away. Get medical help right away. Call your local emergency services (911 in the [place] Do not drive yourself to the hospital. Summary  After the procedure, it is common to have mild pain, tenderness, swelling, and bruising.  Follow instructions from your health care provider about how to take care of the puncture site.  Get help right away if you have any symptoms of infection or if you have more blood or fluid coming from the puncture site. This information is not intended to replace advice given to you by your health care provider. Make sure you discuss any questions you have with your health care provider. Document Released: 06/27/2005 Document Revised: 03/23/2018 Document Reviewed: 05/21/2016 Elsevier Interactive Patient Education  2019 [employer]. Moderate Conscious Sedation, Adult, Care After These instructions provide you with information about caring for yourself after your procedure. Your health care provider may also give you more specific instructions. Your treatment has been planned according to current medical practices, but problems sometimes occur. Call your health care provider if you have any problems or questions after your procedure. What can I expect after the procedure? After your procedure, it is common:  To feel sleepy for several hours.  To feel clumsy and have poor balance for several hours.  To have poor judgment for several hours.  To vomit if you eat too soon. Follow these instructions  at home: For at least 24 hours after the procedure:   Do not: ? Participate in activities where you could fall or become injured. ? Drive. ? Use heavy machinery. ? Drink alcohol. ? Take sleeping pills or medicines that cause drowsiness. ? Make important decisions or sign legal documents. ? Take care of children on your own.  Rest. Eating and drinking  Follow  the diet recommended by your health care provider.  If you vomit: ? Drink water, juice, or soup when you can drink without vomiting. ? Make sure you have little or no nausea before eating solid foods. General instructions  Have a responsible adult stay with you until you are awake and alert.  Take over-the-counter and prescription medicines only as told by your health care provider.  If you smoke, do not smoke without supervision.  Keep all follow-up visits as told by your health care provider. This is important. Contact a health care provider if:  You keep feeling nauseous or you keep vomiting.  You feel light-headed.  You develop a rash.  You have a fever. Get help right away if:  You have trouble breathing. This information is not intended to replace advice given to you by your health care provider. Make sure you discuss any questions you have with your health care provider. Document Released: 09/28/2013 Document Revised: 05/12/2016 Document Reviewed: 03/29/2016 Elsevier Interactive Patient Education  2019 [employer].

## 2019-05-04 NOTE — H&P (Signed)
Referring Physician(s): Ennever,Peter R  Supervising Physician: Markus Daft  Patient Status:  [facility] OP  Chief Complaint: "I'm having a bone marrow biopsy"   Subjective: Patient familiar to IR service from prior L2 biopsy/ablation/kyphoplasty on 04/28/2019.  She has a history of newly diagnosed multiple myeloma and presents again today for CT-guided bone marrow biopsy for staging purposes.  She currently denies fever, headache, chest pain, dyspnea, cough, abdominal pain, nausea, vomiting or bleeding.  She does have very minimal back pain and continues to smoke.  Recent culture from chin abscess revealed MRSA.  Patient currently on Cipro.  Past Medical History:  Diagnosis Date  . Diabetes mellitus without complication (HCC)    Type 2 IDDM x 5 years  . Glaucoma   . Goals of care, counseling/discussion 04/29/2019  . Herniated lumbar intervertebral disc 06/2014  . Multiple myeloma ([place]) 04/29/2019  . PONV (postoperative nausea and vomiting)    Past Surgical History:  Procedure Laterality Date  . CHOLECYSTECTOMY    . COLONOSCOPY N/A 06/28/2015   Procedure: COLONOSCOPY;  Surgeon: Rogene Houston, MD;  Location: [facility];  Service: Endoscopy;  Laterality: N/A;  155  . EYE SURGERY    . IR BONE TUMOR(S)RF ABLATION  04/28/2019  . IR KYPHO LUMBAR INC FX REDUCE BONE BX UNI/BIL CANNULATION INC/IMAGING  04/28/2019  . IR RADIOLOGIST EVAL & MGMT  04/22/2019  . LUMBAR LAMINECTOMY Right 07/12/2014   Procedure: LUMBAR FIVE TO SACRAL ONE MICRODISCECTOMY;  Surgeon: Marybelle Killings, MD;  Location: [facility];  Service: Orthopedics;  Laterality: Right;  . TUBAL LIGATION    . VITRECTOMY 25 GAUGE WITH SCLERAL BUCKLE Left 07/29/2017   Procedure: RETINAL DETACHMENT REPAIR LEFT EYE WITH PARSPLANA VITRECTOMY, AIR FLUID EXCHANGE, ENDO LASTER, DRAINAGE OF SUBRETINAL FLUID,ENDOLASER PPV /25 GAUGE;  Surgeon: Jalene Mullet, MD;  Location: [facility];  Service: Ophthalmology;  Laterality: Left;      Allergies: Metformin   Medications: Prior to Admission medications   Medication Sig Start Date End Date Taking? Authorizing Provider  amoxicillin-clavulanate (AUGMENTIN) 875-125 MG tablet Take 1 tablet by mouth 2 (two) times daily for 14 days. 04/29/19 05/13/19 Yes Ennever, Rudell Cobb, MD  atorvastatin (LIPITOR) 10 MG tablet Take 10 mg by mouth daily. 08/17/18  Yes [provider]  B-D ULTRAFINE III SHORT PEN 31G X 8 MM MISC USE ONE PEN NEEDLE 4 TIMES DAILY 11/19/17  Yes Nida, Marella Chimes, MD  Continuous Blood Gluc Sensor ([place]) MISC Use one sensor every 10 days. 10/14/17  Yes Cassandria Anger, MD  famciclovir (FAMVIR) 250 MG tablet Take 1 tablet (250 mg total) by mouth daily. 04/29/19  Yes Ennever, Rudell Cobb, MD  glipiZIDE (GLUCOTROL XL) 10 MG 24 hr tablet TAKE 1 TABLET BY MOUTH ONCE DAILY FOR SUGARS 04/04/19  Yes [provider]  HYDROcodone-acetaminophen (NORCO/VICODIN) 5-325 MG tablet Take 1 tablet by mouth every 6 (six) hours as needed for moderate pain. 04/22/19  Yes [name], MD  insulin glargine (LANTUS) 100 UNIT/ML injection Inject 30 Units into the skin at bedtime.   Yes [provider]  insulin lispro (HUMALOG KWIKPEN) 100 UNIT/ML KiwkPen Inject 0.22-0.28 mLs (22-28 Units total) into the skin 3 (three) times daily with meals. 01/19/18  Yes Nida, Marella Chimes, MD  latanoprost (XALATAN) 0.005 % ophthalmic solution Place 1 drop into both eyes at bedtime.   Yes [provider]  lisinopril (PRINIVIL,ZESTRIL) 10 MG tablet TAKE 1 TABLET BY MOUTH ONCE DAILY 04/29/17  Yes  Cassandria Anger, MD  Multiple Vitamins-Minerals (PRESERVISION AREDS PO) Take by mouth 2 (two) times daily.   Yes [provider]  ONETOUCH VERIO test strip USE 1 STRIP TO CHECK GLUCOSE 4 TIMES DAILY 09/29/17  Yes Nida, Marella Chimes, MD  PARoxetine (PAXIL) 30 MG tablet Take 30 mg by mouth every morning. 07/12/18  Yes [provider]  ciprofloxacin (CIPRO) 500 MG  tablet Take 1 tablet (500 mg total) by mouth 2 (two) times daily. Take for 10 days. 05/02/19   [name], MD  lenalidomide (REVLIMID) 25 MG capsule Take 1 pill daily at bedtime for 21 days on and 7 days off. 04/29/19   [name], MD  Vitamin D, Ergocalciferol, (DRISDOL) 50000 units CAPS capsule Take 50,000 Units by mouth once a week. Mondays 08/17/18   [provider]     Vital Signs: BP (!) 155/85   Pulse (!) 105   Temp 97.9 F (36.6 C) (Oral)   Resp 18   Ht _0  (1.575 m)   Wt 201 lb 12.8 oz (91.5 kg)   SpO2 100%   BMI 36.91 kg/m   Physical Exam awake, alert.  Chest clear to auscultation bilaterally.  Heart with slightly tachycardic but regular rhythm.  Abdomen soft, positive bowel sounds, nontender.  Puncture sites from prior L2 kyphoplasty are clean, dry, not significantly tender, no hematoma.  No significant lower extremity edema.  Moving all fours okay ,no associated paresthesias of the extremities.  Imaging: No results found.  Labs:  CBC: Recent Labs    12/23/18 0431 04/21/19 1049 04/28/19 0840 05/04/19 0950  WBC 4.2 4.7 3.8* 4.2  HGB 9.9* 11.0* 10.9* 11.3*  HCT 30.6* 32.7* 32.7* 34.7*  PLT 161 151 142* 152    COAGS: Recent Labs    04/28/19 0840  INR 1.1    BMP: Recent Labs    12/21/18 0451 12/22/18 0620 12/23/18 0431 04/15/19 1002 04/21/19 1049  NA 138 139 139  --  138  K 4.3 3.9 4.0  --  3.5  CL 107 106 106  --  105  CO2 _1 --  28  GLUCOSE 230* 245* 174*  --  130*  BUN 29* 27* 24*  --  12  CALCIUM 8.9 9.0 8.6*  --  9.2  CREATININE 1.95* 1.94* 1.87* 0.90 0.75  GFRNONAA 26* 26* 27*  --  >60  GFRAA 30* 30* 31*  --  >60    LIVER FUNCTION TESTS: Recent Labs    04/21/19 1049  BILITOT 0.3  AST 12*  ALT 13  ALKPHOS 123  PROT 8.5*  ALBUMIN 3.3*    Assessment and Plan: Pt with history of newly diagnosed multiple myeloma; s/p L2 biopsy/ablation/kyphoplasty on 04/28/2019- doing remarkably well post procedure with no  significant back pain at present; presents today for CT-guided bone marrow biopsy for staging purposes.  Recent culture of  chin abscess reveals MRSA.  Patient currently on Cipro.  Risks and benefits of procedure was discussed with the patient and/or patient's family including, but not limited to bleeding, infection, damage to adjacent structures or low yield requiring additional tests.  All of the questions were answered and there is agreement to proceed.  Consent signed and in chart.     Electronically Signed: D. Rowe Robert, PA-C 05/04/2019, 10:17 AM   I spent a total of 20 minutes at the the patient's bedside AND on the patient's hospital floor or unit, greater than 50% of which was counseling/coordinating  care for CT-guided bone marrow biopsy

## 2019-05-04 NOTE — Telephone Encounter (Signed)
Oral Oncology Pharmacist Encounter  Received new prescription for Revlimid (lenalidomide) for the treatment of newly diagnosed IgG Kappa myeloma in conjunction with bortezomib and dexamethasone, planned duration until disease progression or unacceptable drug toxicity.  CMP from 04/21/19 assessed, no relevant lab abnormalities. Prescription dose and frequency assessed.   Current medication list in Epic reviewed, no DDIs with Revlimid identified:  Prescription has been e-scribed to [facility].  Oral Oncology Clinic will continue to follow for insurance authorization, copayment issues, initial counseling and start date.  Darl Pikes, PharmD, BCPS, [facility] Hematology/Oncology Clinical Pharmacist [facility] [phone]  05/04/2019 8:52 AM

## 2019-05-05 ENCOUNTER — Ambulatory Visit (HOSPITAL_COMMUNITY): Admission: RE | Admit: 2019-05-05 | Payer: Medicare Other | Source: Ambulatory Visit

## 2019-05-05 ENCOUNTER — Telehealth: Payer: Self-pay | Admitting: *Deleted

## 2019-05-05 DIAGNOSIS — Z809 Family history of malignant neoplasm, unspecified: Secondary | ICD-10-CM | POA: Diagnosis not present

## 2019-05-05 DIAGNOSIS — Z794 Long term (current) use of insulin: Secondary | ICD-10-CM | POA: Diagnosis not present

## 2019-05-05 DIAGNOSIS — Z79899 Other long term (current) drug therapy: Secondary | ICD-10-CM | POA: Diagnosis not present

## 2019-05-05 DIAGNOSIS — F17210 Nicotine dependence, cigarettes, uncomplicated: Secondary | ICD-10-CM | POA: Diagnosis not present

## 2019-05-05 DIAGNOSIS — Z510 Encounter for antineoplastic radiation therapy: Secondary | ICD-10-CM | POA: Diagnosis not present

## 2019-05-05 DIAGNOSIS — M8458XA Pathological fracture in neoplastic disease, other specified site, initial encounter for fracture: Secondary | ICD-10-CM | POA: Diagnosis not present

## 2019-05-05 DIAGNOSIS — E119 Type 2 diabetes mellitus without complications: Secondary | ICD-10-CM | POA: Diagnosis not present

## 2019-05-05 DIAGNOSIS — Z833 Family history of diabetes mellitus: Secondary | ICD-10-CM | POA: Diagnosis not present

## 2019-05-05 DIAGNOSIS — C7951 Secondary malignant neoplasm of bone: Secondary | ICD-10-CM | POA: Diagnosis not present

## 2019-05-05 DIAGNOSIS — C9000 Multiple myeloma not having achieved remission: Secondary | ICD-10-CM | POA: Diagnosis not present

## 2019-05-05 DIAGNOSIS — Z7982 Long term (current) use of aspirin: Secondary | ICD-10-CM | POA: Diagnosis not present

## 2019-05-05 NOTE — Telephone Encounter (Signed)
Call placed back to patient and patient states that her blood sugar is up to 74, that she has eaten and has rescheduled her PET scan to next week.  Pt appreciative of call and has no questions or concerns at this time.

## 2019-05-05 NOTE — Telephone Encounter (Signed)
Message received from patient at 12:04PM stating that her blood sugar is 54 and would like to know if she should eat or not, since she is NPO for PET scan today.  Call placed back to patient and no answer.  According to pt.'s schedule, PET scan has been canceled for today and rescheduled for 05/12/19.

## 2019-05-05 NOTE — Telephone Encounter (Signed)
Oral Chemotherapy Pharmacist Encounter   Revlimid was delivered by [facility] on 05/04/2019.  Darl Pikes, PharmD, BCPS, [facility] Hematology/Oncology Clinical Pharmacist [facility] [place] [facility] [phone]  05/05/2019 3:48 PM

## 2019-05-06 DIAGNOSIS — I10 Essential (primary) hypertension: Secondary | ICD-10-CM | POA: Diagnosis not present

## 2019-05-06 DIAGNOSIS — E1165 Type 2 diabetes mellitus with hyperglycemia: Secondary | ICD-10-CM | POA: Diagnosis not present

## 2019-05-06 DIAGNOSIS — E782 Mixed hyperlipidemia: Secondary | ICD-10-CM | POA: Diagnosis not present

## 2019-05-09 ENCOUNTER — Other Ambulatory Visit: Payer: Self-pay | Admitting: Family

## 2019-05-09 ENCOUNTER — Telehealth: Payer: Self-pay | Admitting: *Deleted

## 2019-05-09 NOTE — Telephone Encounter (Signed)
Call received from patient stating that she is having itching "all over her head" since starting Revlimid, has been using Benadryl cream with little relief and would like to know if she can take oral Benadryl.  Patient notified per order of S. DeWitt NP that she can take oral Benadryl for itching and if oral Benadryl does not help itching to call office back.

## 2019-05-10 DIAGNOSIS — C7951 Secondary malignant neoplasm of bone: Secondary | ICD-10-CM | POA: Diagnosis not present

## 2019-05-10 DIAGNOSIS — Z794 Long term (current) use of insulin: Secondary | ICD-10-CM | POA: Diagnosis not present

## 2019-05-10 DIAGNOSIS — C9000 Multiple myeloma not having achieved remission: Secondary | ICD-10-CM | POA: Diagnosis not present

## 2019-05-10 DIAGNOSIS — M8458XA Pathological fracture in neoplastic disease, other specified site, initial encounter for fracture: Secondary | ICD-10-CM | POA: Diagnosis not present

## 2019-05-10 DIAGNOSIS — F17210 Nicotine dependence, cigarettes, uncomplicated: Secondary | ICD-10-CM | POA: Diagnosis not present

## 2019-05-10 DIAGNOSIS — Z510 Encounter for antineoplastic radiation therapy: Secondary | ICD-10-CM | POA: Diagnosis not present

## 2019-05-10 DIAGNOSIS — E119 Type 2 diabetes mellitus without complications: Secondary | ICD-10-CM | POA: Diagnosis not present

## 2019-05-11 ENCOUNTER — Encounter (HOSPITAL_COMMUNITY): Payer: Self-pay | Admitting: Hematology & Oncology

## 2019-05-11 DIAGNOSIS — C7951 Secondary malignant neoplasm of bone: Secondary | ICD-10-CM | POA: Diagnosis not present

## 2019-05-11 DIAGNOSIS — M8458XA Pathological fracture in neoplastic disease, other specified site, initial encounter for fracture: Secondary | ICD-10-CM | POA: Diagnosis not present

## 2019-05-11 DIAGNOSIS — E119 Type 2 diabetes mellitus without complications: Secondary | ICD-10-CM | POA: Diagnosis not present

## 2019-05-11 DIAGNOSIS — C9000 Multiple myeloma not having achieved remission: Secondary | ICD-10-CM | POA: Diagnosis not present

## 2019-05-11 DIAGNOSIS — F17210 Nicotine dependence, cigarettes, uncomplicated: Secondary | ICD-10-CM | POA: Diagnosis not present

## 2019-05-11 DIAGNOSIS — Z510 Encounter for antineoplastic radiation therapy: Secondary | ICD-10-CM | POA: Diagnosis not present

## 2019-05-11 DIAGNOSIS — Z794 Long term (current) use of insulin: Secondary | ICD-10-CM | POA: Diagnosis not present

## 2019-05-12 ENCOUNTER — Other Ambulatory Visit: Payer: Self-pay

## 2019-05-12 ENCOUNTER — Telehealth: Payer: Self-pay | Admitting: *Deleted

## 2019-05-12 ENCOUNTER — Ambulatory Visit (HOSPITAL_COMMUNITY)
Admission: RE | Admit: 2019-05-12 | Discharge: 2019-05-12 | Disposition: A | Discharge status: Home / Self Care | Payer: Medicare Other | Source: Ambulatory Visit | Attending: Hematology & Oncology | Admitting: Hematology & Oncology

## 2019-05-12 DIAGNOSIS — N200 Calculus of kidney: Secondary | ICD-10-CM | POA: Insufficient documentation

## 2019-05-12 DIAGNOSIS — I2510 Atherosclerotic heart disease of native coronary artery without angina pectoris: Secondary | ICD-10-CM | POA: Diagnosis not present

## 2019-05-12 DIAGNOSIS — C9000 Multiple myeloma not having achieved remission: Secondary | ICD-10-CM | POA: Diagnosis not present

## 2019-05-12 DIAGNOSIS — I700 Atherosclerosis of aorta: Secondary | ICD-10-CM | POA: Diagnosis not present

## 2019-05-12 LAB — GLUCOSE, CAPILLARY: Glucose-Capillary: 198 mg/dL — ABNORMAL HIGH (ref 70–99)

## 2019-05-12 MED ORDER — FLUDEOXYGLUCOSE F - 18 (FDG) INJECTION
10.0000 | Freq: Once | INTRAVENOUS | Status: AC | PRN
  Administered 2019-05-12: 07:00:00 10 via INTRAVENOUS

## 2019-05-12 NOTE — Telephone Encounter (Signed)
-----   Message from [name], MD sent at 05/12/2019  3:36 PM EDT ----- Call- PET scan does show some ares of bone involvment, but not too bad!!  Laurey Arrow

## 2019-05-12 NOTE — Telephone Encounter (Signed)
Notified pt of Pet Scan results.Pt verbalized understanding.

## 2019-05-13 DIAGNOSIS — E119 Type 2 diabetes mellitus without complications: Secondary | ICD-10-CM | POA: Diagnosis not present

## 2019-05-13 DIAGNOSIS — C7951 Secondary malignant neoplasm of bone: Secondary | ICD-10-CM | POA: Diagnosis not present

## 2019-05-13 DIAGNOSIS — Z510 Encounter for antineoplastic radiation therapy: Secondary | ICD-10-CM | POA: Diagnosis not present

## 2019-05-13 DIAGNOSIS — M8458XA Pathological fracture in neoplastic disease, other specified site, initial encounter for fracture: Secondary | ICD-10-CM | POA: Diagnosis not present

## 2019-05-13 DIAGNOSIS — C9000 Multiple myeloma not having achieved remission: Secondary | ICD-10-CM | POA: Diagnosis not present

## 2019-05-13 DIAGNOSIS — Z794 Long term (current) use of insulin: Secondary | ICD-10-CM | POA: Diagnosis not present

## 2019-05-13 DIAGNOSIS — F17210 Nicotine dependence, cigarettes, uncomplicated: Secondary | ICD-10-CM | POA: Diagnosis not present

## 2019-05-17 ENCOUNTER — Encounter: Payer: Self-pay | Admitting: *Deleted

## 2019-05-17 ENCOUNTER — Encounter: Payer: Self-pay | Admitting: Hematology & Oncology

## 2019-05-17 ENCOUNTER — Inpatient Hospital Stay: Payer: Medicare Other

## 2019-05-17 ENCOUNTER — Other Ambulatory Visit: Payer: Self-pay

## 2019-05-17 ENCOUNTER — Inpatient Hospital Stay (HOSPITAL_BASED_OUTPATIENT_CLINIC_OR_DEPARTMENT_OTHER): Payer: Medicare Other | Admitting: Hematology & Oncology

## 2019-05-17 VITALS — BP 90/48 | HR 58 | Temp 98.7°F | Resp 20 | Wt 195.0 lb

## 2019-05-17 DIAGNOSIS — E119 Type 2 diabetes mellitus without complications: Secondary | ICD-10-CM | POA: Diagnosis not present

## 2019-05-17 DIAGNOSIS — Z79899 Other long term (current) drug therapy: Secondary | ICD-10-CM

## 2019-05-17 DIAGNOSIS — C9000 Multiple myeloma not having achieved remission: Secondary | ICD-10-CM

## 2019-05-17 DIAGNOSIS — L0201 Cutaneous abscess of face: Secondary | ICD-10-CM

## 2019-05-17 DIAGNOSIS — Z510 Encounter for antineoplastic radiation therapy: Secondary | ICD-10-CM | POA: Diagnosis not present

## 2019-05-17 DIAGNOSIS — Z794 Long term (current) use of insulin: Secondary | ICD-10-CM

## 2019-05-17 DIAGNOSIS — F17210 Nicotine dependence, cigarettes, uncomplicated: Secondary | ICD-10-CM | POA: Diagnosis not present

## 2019-05-17 DIAGNOSIS — M549 Dorsalgia, unspecified: Secondary | ICD-10-CM

## 2019-05-17 DIAGNOSIS — M8458XA Pathological fracture in neoplastic disease, other specified site, initial encounter for fracture: Secondary | ICD-10-CM | POA: Diagnosis not present

## 2019-05-17 LAB — CBC WITH DIFFERENTIAL (CANCER CENTER ONLY)
Abs Immature Granulocytes: 0.02 10*3/uL (ref 0.00–0.07)
Basophils Absolute: 0 10*3/uL (ref 0.0–0.1)
Basophils Relative: 1 %
Eosinophils Absolute: 0 10*3/uL (ref 0.0–0.5)
Eosinophils Relative: 2 %
HCT: 29.4 % — ABNORMAL LOW (ref 36.0–46.0)
Hemoglobin: 9.6 g/dL — ABNORMAL LOW (ref 12.0–15.0)
Immature Granulocytes: 1 %
Lymphocytes Relative: 39 %
Lymphs Abs: 0.7 10*3/uL (ref 0.7–4.0)
MCH: 32.1 pg (ref 26.0–34.0)
MCHC: 32.7 g/dL (ref 30.0–36.0)
MCV: 98.3 fL (ref 80.0–100.0)
Monocytes Absolute: 0.2 10*3/uL (ref 0.1–1.0)
Monocytes Relative: 12 %
Neutro Abs: 0.8 10*3/uL — ABNORMAL LOW (ref 1.7–7.7)
Neutrophils Relative %: 45 %
Platelet Count: 78 10*3/uL — ABNORMAL LOW (ref 150–400)
RBC: 2.99 MIL/uL — ABNORMAL LOW (ref 3.87–5.11)
RDW: 14.9 % (ref 11.5–15.5)
WBC Count: 1.8 10*3/uL — ABNORMAL LOW (ref 4.0–10.5)
nRBC: 0 % (ref 0.0–0.2)

## 2019-05-17 LAB — CMP (CANCER CENTER ONLY)
ALT: 26 U/L (ref 0–44)
AST: 17 U/L (ref 15–41)
Albumin: 3.5 g/dL (ref 3.5–5.0)
Alkaline Phosphatase: 112 U/L (ref 38–126)
Anion gap: 8 (ref 5–15)
BUN: 19 mg/dL (ref 8–23)
CO2: 28 mmol/L (ref 22–32)
Calcium: 8.7 mg/dL — ABNORMAL LOW (ref 8.9–10.3)
Chloride: 104 mmol/L (ref 98–111)
Creatinine: 1.4 mg/dL — ABNORMAL HIGH (ref 0.44–1.00)
GFR, Est AFR Am: 44 mL/min — ABNORMAL LOW (ref >60)
GFR, Estimated: 38 mL/min — ABNORMAL LOW (ref >60)
Glucose, Bld: 197 mg/dL — ABNORMAL HIGH (ref 70–99)
Potassium: 3.4 mmol/L — ABNORMAL LOW (ref 3.5–5.1)
Sodium: 140 mmol/L (ref 135–145)
Total Bilirubin: 0.4 mg/dL (ref 0.3–1.2)
Total Protein: 6.9 g/dL (ref 6.5–8.1)

## 2019-05-17 MED ORDER — PROCHLORPERAZINE MALEATE 10 MG PO TABS
ORAL_TABLET | ORAL | Status: AC
  Filled 2019-05-17: qty 1

## 2019-05-17 MED ORDER — PROCHLORPERAZINE MALEATE 10 MG PO TABS: 10.0000 mg | ORAL_TABLET | Freq: Four times a day (QID) | ORAL | 1 refills | Status: DC | PRN

## 2019-05-17 MED ORDER — ONDANSETRON HCL 8 MG PO TABS: 8.0000 mg | ORAL_TABLET | Freq: Two times a day (BID) | ORAL | 1 refills | Status: DC | PRN

## 2019-05-17 MED ORDER — PROCHLORPERAZINE MALEATE 10 MG PO TABS
10.0000 mg | ORAL_TABLET | Freq: Once | ORAL | Status: AC
  Administered 2019-05-17: 10 mg via ORAL

## 2019-05-17 MED ORDER — BORTEZOMIB CHEMO SQ INJECTION 3.5 MG (2.5MG/ML)
1.3000 mg/m2 | Freq: Once | INTRAMUSCULAR | Status: AC
  Administered 2019-05-17: 13:00:00 2.5 mg via SUBCUTANEOUS
  Filled 2019-05-17: qty 1

## 2019-05-17 MED ORDER — ONDANSETRON HCL 8 MG PO TABS: 8.0000 mg | ORAL_TABLET | Freq: Two times a day (BID) | ORAL | 2 refills | Status: DC

## 2019-05-17 NOTE — Patient Instructions (Signed)

## 2019-05-17 NOTE — Progress Notes (Signed)
Hematology and Oncology Follow Up Visit  Vicki Moon [id number] 06/29/1951 68 y.o. 05/17/2019   Principle Diagnosis:   IgG Kappa myeloma -- 1p-, 13q-, 16q- and t(11:14)  Current Therapy:    RVD -- start on 05/09/2019  S/P Kyphoplasty at L2  XRT to lumbar spine -- to be done in [place]  Xgeva 120 mg sq q 3 months -- next dose on 07/2019     Interim History:  Vicki Moon is back for follow-up.  She now has started the RVD protocol.  She had some nausea with the Revlimid.  I will call in some Zofran to see if this may help her out a little bit.  We did do a PET scan on her.  This is 05/12/2019.  PET scan showed patchy areas of hypermetabolism throughout the osseous skeleton.  We also had a bone marrow biopsy done on her.  This was a formal bone marrow biopsy done.  This was done on 05/04/2019.  The pathology report ([id number]) showed marked increase in plasma cells.  I am no histochemistry stain, she had 90% plasma cells.  We then did cytogenetics and FISH.  By cytogenetics, she had normal chromosomes.  By [facility], she had multiple abnormalities.  She had a 1p-, 13q-, 16q- and t(11:14).  I suspect that this puts her at high risk for myeloma to recur and she probably would be a candidate for stem cell transplant.  She has not yet started radiation.  This will start today.  She will get her Delton See today.  Thankfully, her pain is doing a lot better.  Overall, her performance status is ECOG 1.   Medications:  Current Outpatient Medications:  .  ALPRAZolam (XANAX) 0.5 MG tablet, Take 0.5 mg by mouth daily., Disp: , Rfl:  .  atorvastatin (LIPITOR) 10 MG tablet, Take 10 mg by mouth daily., Disp: , Rfl: 5 .  B-D ULTRAFINE III SHORT PEN 31G X 8 MM MISC, USE ONE PEN NEEDLE 4 TIMES DAILY, Disp: 100 each, Rfl: 5 .  Continuous Blood Gluc Sensor ([place]) MISC, Use one sensor every 10 days., Disp: 3 each, Rfl: 2 .  famciclovir (FAMVIR) 250 MG tablet, Take 1 tablet  (250 mg total) by mouth daily., Disp: 30 tablet, Rfl: 8 .  glipiZIDE (GLUCOTROL XL) 10 MG 24 hr tablet, TAKE 1 TABLET BY MOUTH ONCE DAILY FOR SUGARS, Disp: , Rfl:  .  HYDROcodone-acetaminophen (NORCO/VICODIN) 5-325 MG tablet, Take 1 tablet by mouth every 6 (six) hours as needed for moderate pain., Disp: 60 tablet, Rfl: 0 .  insulin glargine (LANTUS) 100 UNIT/ML injection, Inject 30 Units into the skin at bedtime., Disp: , Rfl:  .  insulin lispro (HUMALOG KWIKPEN) 100 UNIT/ML KiwkPen, Inject 0.22-0.28 mLs (22-28 Units total) into the skin 3 (three) times daily with meals., Disp: 25 mL, Rfl: 2 .  latanoprost (XALATAN) 0.005 % ophthalmic solution, Place 1 drop into both eyes at bedtime., Disp: , Rfl:  .  lenalidomide (REVLIMID) 25 MG capsule, Take 1 pill daily at bedtime for 21 days on and 7 days off., Disp: 21 capsule, Rfl: 4 .  lisinopril (PRINIVIL,ZESTRIL) 10 MG tablet, TAKE 1 TABLET BY MOUTH ONCE DAILY, Disp: 90 tablet, Rfl: 0 .  Multiple Vitamins-Minerals (PRESERVISION AREDS PO), Take by mouth 2 (two) times daily., Disp: , Rfl:  .  NOVOLOG 100 UNIT/ML injection, INJECT 35 UNITS SUBCUTANEOUSLY WITH MEALS, Disp: , Rfl:  .  ondansetron (ZOFRAN) 8 MG tablet, Take 1 tablet (8 mg  total) by mouth 2 (two) times daily as needed (Nausea or vomiting)., Disp: 30 tablet, Rfl: 1 .  ONETOUCH VERIO test strip, USE 1 STRIP TO CHECK GLUCOSE 4 TIMES DAILY, Disp: 150 each, Rfl: 5 .  PARoxetine (PAXIL) 30 MG tablet, Take 30 mg by mouth every morning., Disp: , Rfl: 3 .  prochlorperazine (COMPAZINE) 10 MG tablet, Take 1 tablet (10 mg total) by mouth every 6 (six) hours as needed (Nausea or vomiting)., Disp: 30 tablet, Rfl: 1 .  TRESIBA FLEXTOUCH 100 UNIT/ML SOPN FlexTouch Pen, INJECT 35 UNITS SUBCUTANEOUSLY AT BEDTIME, Disp: , Rfl:  .  Vitamin D, Ergocalciferol, (DRISDOL) 50000 units CAPS capsule, Take 50,000 Units by mouth once a week. Mondays, Disp: , Rfl: 1 No current facility-administered medications for this visit.    Facility-Administered Medications Ordered in Other Visits:  .  bortezomib SQ (VELCADE) chemo injection 2.5 mg, 1.3 mg/m2 (Treatment Plan Recorded), Subcutaneous, Once, , Rudell Cobb, MD .  prochlorperazine (COMPAZINE) tablet 10 mg, 10 mg, Oral, Once, , Rudell Cobb, MD  Allergies:  Allergies  Allergen Reactions  . Metformin Nausea Only    "severe GI"    Past Medical History, Surgical history, Social history, and Family History were reviewed and updated.  Review of Systems: Review of Systems  Constitutional: Negative.   HENT:  Negative.   Eyes: Negative.   Respiratory: Negative.   Cardiovascular: Negative.   Gastrointestinal: Negative.   Endocrine: Negative.   Genitourinary: Negative.    Musculoskeletal: Positive for back pain.  Skin: Negative.   Neurological: Negative.   Hematological: Negative.   Psychiatric/Behavioral: Negative.     Physical Exam:  weight is 195 lb (88.5 kg). Her oral temperature is 98.7 F (37.1 C). Her blood pressure is 90/48 (abnormal) and her pulse is 58 (abnormal). Her respiration is 20 and oxygen saturation is 97%.   Wt Readings from Last 3 Encounters:  05/17/19 195 lb (88.5 kg)  05/04/19 201 lb 12.8 oz (91.5 kg)  04/29/19 201 lb 12.8 oz (91.5 kg)    Physical Exam Vitals signs reviewed.  HENT:     Head: Normocephalic and atraumatic.  Eyes:     Pupils: Pupils are equal, round, and reactive to light.  Neck:     Musculoskeletal: Normal range of motion.  Cardiovascular:     Rate and Rhythm: Normal rate and regular rhythm.     Heart sounds: Normal heart sounds.  Pulmonary:     Effort: Pulmonary effort is normal.     Breath sounds: Normal breath sounds.  Abdominal:     General: Bowel sounds are normal.     Palpations: Abdomen is soft.  Musculoskeletal: Normal range of motion.        General: No tenderness or deformity.  Lymphadenopathy:     Cervical: No cervical adenopathy.  Skin:    General: Skin is warm and dry.     Findings:  No erythema or rash.  Neurological:     Mental Status: She is alert and oriented to person, place, and time.  Psychiatric:        Behavior: Behavior normal.        Thought Content: Thought content normal.        Judgment: Judgment normal.      Lab Results  Component Value Date   WBC 1.8 (L) 05/17/2019   HGB 9.6 (L) 05/17/2019   HCT 29.4 (L) 05/17/2019   MCV 98.3 05/17/2019   PLT 78 (L) 05/17/2019     Chemistry  Component Value Date/Time   NA 138 04/21/2019 1049   K 3.5 04/21/2019 1049   CL 105 04/21/2019 1049   CO2 28 04/21/2019 1049   BUN 12 04/21/2019 1049   CREATININE 0.75 04/21/2019 1049   CREATININE 0.65 01/12/2018 1004      Component Value Date/Time   CALCIUM 9.2 04/21/2019 1049   ALKPHOS 123 04/21/2019 1049   AST 12 (L) 04/21/2019 1049   ALT 13 04/21/2019 1049   BILITOT 0.3 04/21/2019 1049       Impression and Plan: Vicki Moon is a 68 year old white female.  She has a new diagnosis of IgG kappa myeloma.  I think this is in the high risk group.  We will have to see how she responds to RVD.  Hopefully, she will do okay with the Revlimid.  She is on some Benadryl for this.  I told her to take 40 mg daily of over-the-counter Pepcid try to help with her stomach.  We will have her come back in 1 month to see [place].  She will have weekly Velcade for 3 weeks on and one-week off.  We should be able to see a nice response to the treatment by her blood counts going down with her myeloma studies.    [name], MD 5/26/202011:54 AM

## 2019-05-17 NOTE — Progress Notes (Signed)
Okay to treat today with ANC = 0.8 and Pltc = 78. Patient is taking ASA with her Revlimid, RN will add to med list in chart.

## 2019-05-18 ENCOUNTER — Encounter: Payer: Self-pay | Admitting: *Deleted

## 2019-05-18 LAB — KAPPA/LAMBDA LIGHT CHAINS
Kappa free light chain: 326.7 mg/L — ABNORMAL HIGH (ref 3.3–19.4)
Kappa, lambda light chain ratio: 20.04 — ABNORMAL HIGH (ref 0.26–1.65)
Lambda free light chains: 16.3 mg/L (ref 5.7–26.3)

## 2019-05-18 LAB — IGG, IGA, IGM
IgA: 40 mg/dL — ABNORMAL LOW (ref 87–352)
IgG (Immunoglobin G), Serum: 2439 mg/dL — ABNORMAL HIGH (ref 586–1602)
IgM (Immunoglobulin M), Srm: 50 mg/dL (ref 26–217)

## 2019-05-18 LAB — BETA 2 MICROGLOBULIN, SERUM: Beta-2 Microglobulin: 6 mg/L — ABNORMAL HIGH (ref 0.6–2.4)

## 2019-05-18 NOTE — Progress Notes (Signed)
Called patient to follow up on her first treatment yesterday.  She is doing well today and has no side effects from the velcade. She continues to have some nausea from her Revlimid. She picked up her two antiemetic medications yesterday and has them at home. Reviewed instructions for use and encouraged her to use them as needed for nausea. The nausea generally comes after eating and at this time, it is no effecting her intake. She agrees to try the antiemetics as needed.  She has some questions about her treatment schedule and currently scheduled appointments. Reviewed the 21/7 schedule and when in her cycles she sees the provider. She felt like she better understood everything after our conversation.   She has my contact information and knows to contact us with any questions or concerns.

## 2019-05-19 DIAGNOSIS — C9000 Multiple myeloma not having achieved remission: Secondary | ICD-10-CM | POA: Diagnosis not present

## 2019-05-19 DIAGNOSIS — E119 Type 2 diabetes mellitus without complications: Secondary | ICD-10-CM | POA: Diagnosis not present

## 2019-05-19 DIAGNOSIS — Z794 Long term (current) use of insulin: Secondary | ICD-10-CM | POA: Diagnosis not present

## 2019-05-19 DIAGNOSIS — Z510 Encounter for antineoplastic radiation therapy: Secondary | ICD-10-CM | POA: Diagnosis not present

## 2019-05-19 DIAGNOSIS — F17210 Nicotine dependence, cigarettes, uncomplicated: Secondary | ICD-10-CM | POA: Diagnosis not present

## 2019-05-19 DIAGNOSIS — M8458XA Pathological fracture in neoplastic disease, other specified site, initial encounter for fracture: Secondary | ICD-10-CM | POA: Diagnosis not present

## 2019-05-19 LAB — PROTEIN ELECTROPHORESIS, SERUM, WITH REFLEX
A/G Ratio: 0.8 (ref 0.7–1.7)
Albumin ELP: 3.1 g/dL (ref 2.9–4.4)
Alpha-1-Globulin: 0.3 g/dL (ref 0.0–0.4)
Alpha-2-Globulin: 0.8 g/dL (ref 0.4–1.0)
Beta Globulin: 1 g/dL (ref 0.7–1.3)
Gamma Globulin: 2 g/dL — ABNORMAL HIGH (ref 0.4–1.8)
Globulin, Total: 4.1 g/dL — ABNORMAL HIGH (ref 2.2–3.9)
M-Spike, %: 1.7 g/dL — ABNORMAL HIGH
SPEP Interpretation: 0
Total Protein ELP: 7.2 g/dL (ref 6.0–8.5)

## 2019-05-19 LAB — IMMUNOFIXATION REFLEX, SERUM
IgA: 45 mg/dL — ABNORMAL LOW (ref 87–352)
IgG (Immunoglobin G), Serum: 2382 mg/dL — ABNORMAL HIGH (ref 586–1602)
IgM (Immunoglobulin M), Srm: 51 mg/dL (ref 26–217)

## 2019-05-20 DIAGNOSIS — E119 Type 2 diabetes mellitus without complications: Secondary | ICD-10-CM | POA: Diagnosis not present

## 2019-05-20 DIAGNOSIS — M8458XA Pathological fracture in neoplastic disease, other specified site, initial encounter for fracture: Secondary | ICD-10-CM | POA: Diagnosis not present

## 2019-05-20 DIAGNOSIS — Z794 Long term (current) use of insulin: Secondary | ICD-10-CM | POA: Diagnosis not present

## 2019-05-20 DIAGNOSIS — Z510 Encounter for antineoplastic radiation therapy: Secondary | ICD-10-CM | POA: Diagnosis not present

## 2019-05-20 DIAGNOSIS — F17210 Nicotine dependence, cigarettes, uncomplicated: Secondary | ICD-10-CM | POA: Diagnosis not present

## 2019-05-20 DIAGNOSIS — C9000 Multiple myeloma not having achieved remission: Secondary | ICD-10-CM | POA: Diagnosis not present

## 2019-05-23 ENCOUNTER — Other Ambulatory Visit: Payer: Self-pay | Admitting: *Deleted

## 2019-05-23 DIAGNOSIS — Z510 Encounter for antineoplastic radiation therapy: Secondary | ICD-10-CM | POA: Diagnosis not present

## 2019-05-23 DIAGNOSIS — C9000 Multiple myeloma not having achieved remission: Secondary | ICD-10-CM | POA: Diagnosis not present

## 2019-05-23 MED ORDER — LENALIDOMIDE 25 MG PO CAPS: ORAL_CAPSULE | ORAL | 4 refills | Status: DC

## 2019-05-24 ENCOUNTER — Telehealth: Payer: Self-pay | Admitting: Hematology & Oncology

## 2019-05-24 ENCOUNTER — Inpatient Hospital Stay: Payer: Medicare Other

## 2019-05-24 ENCOUNTER — Encounter (HOSPITAL_COMMUNITY): Payer: Self-pay

## 2019-05-24 ENCOUNTER — Inpatient Hospital Stay (HOSPITAL_COMMUNITY)
Admission: EM | Admit: 2019-05-24 | Discharge: 2019-06-01 | DRG: 602 | Disposition: A | Discharge status: Home / Self Care | Payer: Medicare Other | Source: Ambulatory Visit | Attending: Internal Medicine | Admitting: Internal Medicine

## 2019-05-24 ENCOUNTER — Other Ambulatory Visit: Payer: Self-pay

## 2019-05-24 ENCOUNTER — Inpatient Hospital Stay: Payer: Medicare Other | Attending: Hematology & Oncology

## 2019-05-24 ENCOUNTER — Other Ambulatory Visit: Payer: Medicare Other

## 2019-05-24 ENCOUNTER — Inpatient Hospital Stay (HOSPITAL_BASED_OUTPATIENT_CLINIC_OR_DEPARTMENT_OTHER): Payer: Medicare Other | Admitting: Hematology & Oncology

## 2019-05-24 DIAGNOSIS — N179 Acute kidney failure, unspecified: Secondary | ICD-10-CM | POA: Diagnosis not present

## 2019-05-24 DIAGNOSIS — N170 Acute kidney failure with tubular necrosis: Secondary | ICD-10-CM | POA: Diagnosis present

## 2019-05-24 DIAGNOSIS — L02214 Cutaneous abscess of groin: Secondary | ICD-10-CM | POA: Insufficient documentation

## 2019-05-24 DIAGNOSIS — Z794 Long term (current) use of insulin: Secondary | ICD-10-CM

## 2019-05-24 DIAGNOSIS — L03314 Cellulitis of groin: Secondary | ICD-10-CM | POA: Diagnosis present

## 2019-05-24 DIAGNOSIS — A4902 Methicillin resistant Staphylococcus aureus infection, unspecified site: Secondary | ICD-10-CM | POA: Diagnosis not present

## 2019-05-24 DIAGNOSIS — E782 Mixed hyperlipidemia: Secondary | ICD-10-CM | POA: Diagnosis not present

## 2019-05-24 DIAGNOSIS — F17200 Nicotine dependence, unspecified, uncomplicated: Secondary | ICD-10-CM | POA: Diagnosis present

## 2019-05-24 DIAGNOSIS — E785 Hyperlipidemia, unspecified: Secondary | ICD-10-CM | POA: Diagnosis present

## 2019-05-24 DIAGNOSIS — Z888 Allergy status to other drugs, medicaments and biological substances status: Secondary | ICD-10-CM | POA: Diagnosis not present

## 2019-05-24 DIAGNOSIS — C9000 Multiple myeloma not having achieved remission: Secondary | ICD-10-CM

## 2019-05-24 DIAGNOSIS — E872 Acidosis: Secondary | ICD-10-CM | POA: Diagnosis not present

## 2019-05-24 DIAGNOSIS — L0292 Furuncle, unspecified: Secondary | ICD-10-CM

## 2019-05-24 DIAGNOSIS — Z9049 Acquired absence of other specified parts of digestive tract: Secondary | ICD-10-CM

## 2019-05-24 DIAGNOSIS — I10 Essential (primary) hypertension: Secondary | ICD-10-CM | POA: Diagnosis not present

## 2019-05-24 DIAGNOSIS — F411 Generalized anxiety disorder: Secondary | ICD-10-CM | POA: Diagnosis present

## 2019-05-24 DIAGNOSIS — Z833 Family history of diabetes mellitus: Secondary | ICD-10-CM

## 2019-05-24 DIAGNOSIS — B9562 Methicillin resistant Staphylococcus aureus infection as the cause of diseases classified elsewhere: Secondary | ICD-10-CM | POA: Insufficient documentation

## 2019-05-24 DIAGNOSIS — I1310 Hypertensive heart and chronic kidney disease without heart failure, with stage 1 through stage 4 chronic kidney disease, or unspecified chronic kidney disease: Secondary | ICD-10-CM | POA: Diagnosis present

## 2019-05-24 DIAGNOSIS — F17210 Nicotine dependence, cigarettes, uncomplicated: Secondary | ICD-10-CM | POA: Diagnosis present

## 2019-05-24 DIAGNOSIS — E119 Type 2 diabetes mellitus without complications: Secondary | ICD-10-CM

## 2019-05-24 DIAGNOSIS — Z923 Personal history of irradiation: Secondary | ICD-10-CM

## 2019-05-24 DIAGNOSIS — Z22322 Carrier or suspected carrier of Methicillin resistant Staphylococcus aureus: Secondary | ICD-10-CM | POA: Diagnosis not present

## 2019-05-24 DIAGNOSIS — D61818 Other pancytopenia: Secondary | ICD-10-CM | POA: Diagnosis not present

## 2019-05-24 DIAGNOSIS — I959 Hypotension, unspecified: Secondary | ICD-10-CM | POA: Diagnosis present

## 2019-05-24 DIAGNOSIS — F329 Major depressive disorder, single episode, unspecified: Secondary | ICD-10-CM | POA: Diagnosis present

## 2019-05-24 DIAGNOSIS — R21 Rash and other nonspecific skin eruption: Secondary | ICD-10-CM

## 2019-05-24 DIAGNOSIS — E270 Other adrenocortical overactivity: Secondary | ICD-10-CM | POA: Diagnosis not present

## 2019-05-24 DIAGNOSIS — E118 Type 2 diabetes mellitus with unspecified complications: Secondary | ICD-10-CM | POA: Diagnosis not present

## 2019-05-24 DIAGNOSIS — Z808 Family history of malignant neoplasm of other organs or systems: Secondary | ICD-10-CM

## 2019-05-24 DIAGNOSIS — L0291 Cutaneous abscess, unspecified: Secondary | ICD-10-CM | POA: Diagnosis present

## 2019-05-24 DIAGNOSIS — IMO0002 Reserved for concepts with insufficient information to code with codable children: Secondary | ICD-10-CM

## 2019-05-24 DIAGNOSIS — E1169 Type 2 diabetes mellitus with other specified complication: Secondary | ICD-10-CM | POA: Diagnosis not present

## 2019-05-24 DIAGNOSIS — Z1159 Encounter for screening for other viral diseases: Secondary | ICD-10-CM | POA: Diagnosis not present

## 2019-05-24 DIAGNOSIS — L03111 Cellulitis of right axilla: Secondary | ICD-10-CM

## 2019-05-24 DIAGNOSIS — E1122 Type 2 diabetes mellitus with diabetic chronic kidney disease: Secondary | ICD-10-CM | POA: Diagnosis present

## 2019-05-24 DIAGNOSIS — L02224 Furuncle of groin: Secondary | ICD-10-CM | POA: Diagnosis present

## 2019-05-24 DIAGNOSIS — E876 Hypokalemia: Secondary | ICD-10-CM

## 2019-05-24 DIAGNOSIS — Z03818 Encounter for observation for suspected exposure to other biological agents ruled out: Secondary | ICD-10-CM | POA: Diagnosis not present

## 2019-05-24 DIAGNOSIS — D61810 Antineoplastic chemotherapy induced pancytopenia: Secondary | ICD-10-CM | POA: Diagnosis present

## 2019-05-24 DIAGNOSIS — Z8249 Family history of ischemic heart disease and other diseases of the circulatory system: Secondary | ICD-10-CM

## 2019-05-24 DIAGNOSIS — E1165 Type 2 diabetes mellitus with hyperglycemia: Secondary | ICD-10-CM

## 2019-05-24 DIAGNOSIS — L0390 Cellulitis, unspecified: Secondary | ICD-10-CM | POA: Diagnosis present

## 2019-05-24 DIAGNOSIS — Z823 Family history of stroke: Secondary | ICD-10-CM

## 2019-05-24 DIAGNOSIS — N189 Chronic kidney disease, unspecified: Secondary | ICD-10-CM | POA: Diagnosis not present

## 2019-05-24 DIAGNOSIS — T451X5A Adverse effect of antineoplastic and immunosuppressive drugs, initial encounter: Secondary | ICD-10-CM | POA: Diagnosis present

## 2019-05-24 DIAGNOSIS — E875 Hyperkalemia: Secondary | ICD-10-CM | POA: Diagnosis not present

## 2019-05-24 DIAGNOSIS — N183 Chronic kidney disease, stage 3 (moderate): Secondary | ICD-10-CM | POA: Diagnosis present

## 2019-05-24 DIAGNOSIS — Z79899 Other long term (current) drug therapy: Secondary | ICD-10-CM

## 2019-05-24 DIAGNOSIS — Z79891 Long term (current) use of opiate analgesic: Secondary | ICD-10-CM

## 2019-05-24 DIAGNOSIS — Z7982 Long term (current) use of aspirin: Secondary | ICD-10-CM

## 2019-05-24 LAB — CMP (CANCER CENTER ONLY)
ALT: 23 U/L (ref 0–44)
AST: 14 U/L — ABNORMAL LOW (ref 15–41)
Albumin: 3.4 g/dL — ABNORMAL LOW (ref 3.5–5.0)
Alkaline Phosphatase: 100 U/L (ref 38–126)
Anion gap: 8 (ref 5–15)
BUN: 12 mg/dL (ref 8–23)
CO2: 27 mmol/L (ref 22–32)
Calcium: 7.8 mg/dL — ABNORMAL LOW (ref 8.9–10.3)
Chloride: 103 mmol/L (ref 98–111)
Creatinine: 1.17 mg/dL — ABNORMAL HIGH (ref 0.44–1.00)
GFR, Est AFR Am: 55 mL/min — ABNORMAL LOW (ref >60)
GFR, Estimated: 47 mL/min — ABNORMAL LOW (ref >60)
Glucose, Bld: 276 mg/dL — ABNORMAL HIGH (ref 70–99)
Potassium: 3.2 mmol/L — ABNORMAL LOW (ref 3.5–5.1)
Sodium: 138 mmol/L (ref 135–145)
Total Bilirubin: 0.6 mg/dL (ref 0.3–1.2)
Total Protein: 6.9 g/dL (ref 6.5–8.1)

## 2019-05-24 LAB — GLUCOSE, CAPILLARY
Glucose-Capillary: 88 mg/dL (ref 70–99)
Glucose-Capillary: 157 mg/dL — ABNORMAL HIGH (ref 70–99)

## 2019-05-24 LAB — CBC WITH DIFFERENTIAL (CANCER CENTER ONLY)
Abs Immature Granulocytes: 0.01 10*3/uL (ref 0.00–0.07)
Basophils Absolute: 0 10*3/uL (ref 0.0–0.1)
Basophils Relative: 0 %
Eosinophils Absolute: 0 10*3/uL (ref 0.0–0.5)
Eosinophils Relative: 1 %
HCT: 23.9 % — ABNORMAL LOW (ref 36.0–46.0)
Hemoglobin: 8 g/dL — ABNORMAL LOW (ref 12.0–15.0)
Immature Granulocytes: 1 %
Lymphocytes Relative: 30 %
Lymphs Abs: 0.3 10*3/uL — ABNORMAL LOW (ref 0.7–4.0)
MCH: 32.4 pg (ref 26.0–34.0)
MCHC: 33.5 g/dL (ref 30.0–36.0)
MCV: 96.8 fL (ref 80.0–100.0)
Monocytes Absolute: 0.2 10*3/uL (ref 0.1–1.0)
Monocytes Relative: 15 %
Neutro Abs: 0.6 10*3/uL — ABNORMAL LOW (ref 1.7–7.7)
Neutrophils Relative %: 53 %
Platelet Count: 43 10*3/uL — ABNORMAL LOW (ref 150–400)
RBC: 2.47 MIL/uL — ABNORMAL LOW (ref 3.87–5.11)
RDW: 14.7 % (ref 11.5–15.5)
WBC Count: 1.1 10*3/uL — ABNORMAL LOW (ref 4.0–10.5)
nRBC: 0 % (ref 0.0–0.2)

## 2019-05-24 LAB — LACTIC ACID, PLASMA: Lactic Acid, Venous: 1.3 mmol/L (ref 0.5–1.9)

## 2019-05-24 LAB — MAGNESIUM: Magnesium: 1.7 mg/dL (ref 1.7–2.4)

## 2019-05-24 LAB — SARS CORONAVIRUS 2 BY RT PCR (HOSPITAL ORDER, PERFORMED IN ~~LOC~~ HOSPITAL LAB): SARS Coronavirus 2: NEGATIVE

## 2019-05-24 MED ORDER — PAROXETINE HCL 20 MG PO TABS
30.0000 mg | ORAL_TABLET | Freq: Every morning | ORAL | Status: DC
  Administered 2019-05-25 – 2019-06-01 (×8): 30 mg via ORAL
  Filled 2019-05-24 (×8): qty 1

## 2019-05-24 MED ORDER — INSULIN ASPART 100 UNIT/ML ~~LOC~~ SOLN
0.0000 [IU] | Freq: Three times a day (TID) | SUBCUTANEOUS | Status: DC
  Administered 2019-05-25: 08:00:00 3 [IU] via SUBCUTANEOUS
  Administered 2019-05-26: 2 [IU] via SUBCUTANEOUS
  Administered 2019-05-26: 3 [IU] via SUBCUTANEOUS
  Administered 2019-05-29 – 2019-05-31 (×3): 2 [IU] via SUBCUTANEOUS

## 2019-05-24 MED ORDER — ACETAMINOPHEN 325 MG PO TABS
650.0000 mg | ORAL_TABLET | Freq: Four times a day (QID) | ORAL | Status: DC | PRN
  Administered 2019-05-24 – 2019-05-26 (×2): 650 mg via ORAL
  Filled 2019-05-24 (×2): qty 2

## 2019-05-24 MED ORDER — VANCOMYCIN HCL IN DEXTROSE 750-5 MG/150ML-% IV SOLN
750.0000 mg | Freq: Two times a day (BID) | INTRAVENOUS | Status: DC
  Administered 2019-05-25: 750 mg via INTRAVENOUS
  Filled 2019-05-24: qty 150

## 2019-05-24 MED ORDER — SODIUM CHLORIDE 0.9 % IV BOLUS
500.0000 mL | Freq: Once | INTRAVENOUS | Status: AC
  Administered 2019-05-24: 500 mL via INTRAVENOUS

## 2019-05-24 MED ORDER — NICOTINE 21 MG/24HR TD PT24: 21.0000 mg | MEDICATED_PATCH | Freq: Every day | TRANSDERMAL | Status: DC | PRN

## 2019-05-24 MED ORDER — ONDANSETRON HCL 4 MG/2ML IJ SOLN
4.0000 mg | Freq: Four times a day (QID) | INTRAMUSCULAR | Status: DC | PRN
  Administered 2019-05-24 – 2019-05-31 (×3): 4 mg via INTRAVENOUS
  Filled 2019-05-24 (×3): qty 2

## 2019-05-24 MED ORDER — HYDROCODONE-ACETAMINOPHEN 5-325 MG PO TABS
1.0000 | ORAL_TABLET | Freq: Four times a day (QID) | ORAL | Status: DC | PRN
  Administered 2019-05-24 – 2019-05-27 (×3): 1 via ORAL
  Filled 2019-05-24 (×4): qty 1

## 2019-05-24 MED ORDER — MUPIROCIN CALCIUM 2 % EX CREA
TOPICAL_CREAM | Freq: Two times a day (BID) | CUTANEOUS | Status: DC
  Administered 2019-05-24 – 2019-06-01 (×10): via TOPICAL
  Filled 2019-05-24: qty 15

## 2019-05-24 MED ORDER — FAMCICLOVIR 500 MG PO TABS
250.0000 mg | ORAL_TABLET | Freq: Every day | ORAL | Status: DC
  Administered 2019-05-25 – 2019-06-01 (×8): 250 mg via ORAL
  Filled 2019-05-24 (×11): qty 0.5

## 2019-05-24 MED ORDER — INSULIN ASPART 100 UNIT/ML ~~LOC~~ SOLN: 0.0000 [IU] | Freq: Every day | SUBCUTANEOUS | Status: DC

## 2019-05-24 MED ORDER — LATANOPROST 0.005 % OP SOLN
1.0000 [drp] | Freq: Every day | OPHTHALMIC | Status: DC
  Administered 2019-05-24 – 2019-05-31 (×7): 1 [drp] via OPHTHALMIC
  Filled 2019-05-24 (×2): qty 2.5

## 2019-05-24 MED ORDER — VANCOMYCIN HCL IN DEXTROSE 1-5 GM/200ML-% IV SOLN
1000.0000 mg | INTRAVENOUS | Status: AC
  Administered 2019-05-24 (×2): 1000 mg via INTRAVENOUS
  Filled 2019-05-24 (×2): qty 200

## 2019-05-24 MED ORDER — ASPIRIN 325 MG PO TABS
325.0000 mg | ORAL_TABLET | Freq: Every day | ORAL | Status: DC
  Administered 2019-05-24 – 2019-06-01 (×9): 325 mg via ORAL
  Filled 2019-05-24 (×9): qty 1

## 2019-05-24 MED ORDER — INSULIN GLARGINE 100 UNIT/ML ~~LOC~~ SOLN
30.0000 [IU] | Freq: Every day | SUBCUTANEOUS | Status: DC
  Filled 2019-05-24: qty 0.3

## 2019-05-24 MED ORDER — CHLORHEXIDINE GLUCONATE 4 % EX LIQD
Freq: Once | CUTANEOUS | Status: AC
  Administered 2019-05-24: 17:00:00 via TOPICAL
  Filled 2019-05-24: qty 15

## 2019-05-24 MED ORDER — ACETAMINOPHEN 650 MG RE SUPP: 650.0000 mg | Freq: Four times a day (QID) | RECTAL | Status: DC | PRN

## 2019-05-24 MED ORDER — INSULIN ASPART 100 UNIT/ML ~~LOC~~ SOLN
8.0000 [IU] | Freq: Three times a day (TID) | SUBCUTANEOUS | Status: DC
  Administered 2019-05-25: 8 [IU] via SUBCUTANEOUS

## 2019-05-24 MED ORDER — MUPIROCIN 2 % EX OINT
TOPICAL_OINTMENT | Freq: Two times a day (BID) | CUTANEOUS | Status: DC
  Administered 2019-05-24 (×2): via NASAL
  Administered 2019-05-24: 1 via NASAL
  Administered 2019-05-25 – 2019-06-01 (×15): via NASAL
  Administered 2019-06-01: 1 via NASAL
  Filled 2019-05-24: qty 22

## 2019-05-24 MED ORDER — POTASSIUM CHLORIDE CRYS ER 20 MEQ PO TBCR
40.0000 meq | EXTENDED_RELEASE_TABLET | Freq: Once | ORAL | Status: AC
  Administered 2019-05-24: 40 meq via ORAL
  Filled 2019-05-24: qty 2

## 2019-05-24 MED ORDER — ONDANSETRON HCL 4 MG PO TABS
4.0000 mg | ORAL_TABLET | Freq: Four times a day (QID) | ORAL | Status: DC | PRN
  Administered 2019-05-25 – 2019-05-26 (×2): 4 mg via ORAL
  Filled 2019-05-24 (×2): qty 1

## 2019-05-24 MED ORDER — POTASSIUM CHLORIDE IN NACL 20-0.9 MEQ/L-% IV SOLN
INTRAVENOUS | Status: DC
  Administered 2019-05-24 – 2019-05-26 (×3): via INTRAVENOUS

## 2019-05-24 MED ORDER — ATORVASTATIN CALCIUM 10 MG PO TABS
10.0000 mg | ORAL_TABLET | Freq: Every day | ORAL | Status: DC
  Administered 2019-05-24 – 2019-05-31 (×8): 10 mg via ORAL
  Filled 2019-05-24 (×8): qty 1

## 2019-05-24 MED ORDER — INSULIN ASPART 100 UNIT/ML ~~LOC~~ SOLN: 10.0000 [IU] | Freq: Three times a day (TID) | SUBCUTANEOUS | Status: DC

## 2019-05-24 MED ORDER — ALPRAZOLAM 0.5 MG PO TABS
0.5000 mg | ORAL_TABLET | Freq: Three times a day (TID) | ORAL | Status: DC | PRN
  Administered 2019-05-26 – 2019-05-30 (×2): 0.5 mg via ORAL
  Filled 2019-05-24 (×2): qty 1

## 2019-05-24 MED ORDER — INSULIN GLARGINE 100 UNIT/ML ~~LOC~~ SOLN
25.0000 [IU] | Freq: Every day | SUBCUTANEOUS | Status: DC
  Administered 2019-05-24 – 2019-05-28 (×5): 25 [IU] via SUBCUTANEOUS
  Filled 2019-05-24 (×6): qty 0.25

## 2019-05-24 MED ORDER — SENNOSIDES-DOCUSATE SODIUM 8.6-50 MG PO TABS: 1.0000 | ORAL_TABLET | Freq: Every evening | ORAL | Status: DC | PRN

## 2019-05-24 MED ORDER — LISINOPRIL 10 MG PO TABS
10.0000 mg | ORAL_TABLET | Freq: Every day | ORAL | Status: DC
  Administered 2019-05-24 – 2019-05-25 (×2): 10 mg via ORAL
  Filled 2019-05-24 (×2): qty 1

## 2019-05-24 MED ORDER — TRAZODONE HCL 50 MG PO TABS: 25.0000 mg | ORAL_TABLET | Freq: Every evening | ORAL | Status: DC | PRN

## 2019-05-24 NOTE — ED Notes (Signed)
Blood culture order placed after IV antibiotics started.

## 2019-05-24 NOTE — ED Triage Notes (Signed)
Pt presents to ED with complaints of rash which started over the weekend. Pt states rash is all over body. Pt denies itching. Pt currently under going chemo treatment.

## 2019-05-24 NOTE — Progress Notes (Signed)
Due to lab values from today pt. Is not getting treated  today per Dr. [name] . She will return at her next scheduled visit.

## 2019-05-24 NOTE — Progress Notes (Signed)
I do see Ms. Dech will quickly.  She was here to get her Velcade.  The issue now is that despite being on ciprofloxacin, this MRSA infection is becoming more of a problem for her.  She has multiple areas on her body that have these little abscesses.  The biggest one is in her right inguinal region.  This is quite painful.  I think it is clear that she is going to need IV antibiotics.  She lives up in [place] and has always gone to [facility].  She did have a little bit of a temperature this morning.  Her blood counts are quite low.  She is pancytopenic likely from the Revlimid.  I told her to stop the Revlimid.  The treatments are working already.  Her M spike is down to 1.7 g/dL.  Her IgG level is 2282 mg/dL.  Her kappa light chain is down to 33 mg/dL.  She is getting radiation therapy to her back.  I spoke to the radiation oncologist up in [place].  We will hold her radiation for right now.  I think it is much more important that she have IV antibiotics for these areas of MRSA.  She had a similar episode back in December 2019.

## 2019-05-24 NOTE — H&P (Addendum)
History and Physical  [name] ECX:[id number] DOB: 02-17-1951 DOA: 05/24/2019  Referring physician: Eulis Foster MD  PCP: [name], MD  Oncologist: [name]   Chief Complaint: Boils on skin  HPI: Vicki Moon is a 68 y.o. female who is currently being treated by Dr. [name] for multiple myeloma was seen in the office today to get her Velcade treatment.  She has been having problems with this recurrent disseminated MRSA infection that manifested by multiple skin abscesses all over her body.  She was hospitalized for this back in December 2019.  Now she is having a recurrence of this MRSA infection.  Her treatment for multiple myeloma was placed on temporary hold by her oncologist.  She is going to stop the radiation temporarily and stop the Revlimid temporarily because she is pancytopenic.  The treatments for multiple myeloma had been working as evidenced by improvement in the M spike being down and her IgG level was improving.  She has been dealing with multiple skin abscesses.  The biggest one is noted to be in the right inguinal area and it has been painful.  She has had I&D procedures done in the past.  ED course: The patient noted to have hypokalemia with a potassium of 3.2.  Creatinine 1.17.  GFR 47.  Her WBC was 1.1.  Platelet count 43.  Hemoglobin 8.0.  Her temp is 99.3.  Her blood pressure is 130/44.  Pulse ox 100% on room air.  The patient was started on IV vancomycin and Bactroban topical treatments.  Blood cultures pending. Pt was bolused IV fluids and given oral potassium.  Admission was requested.    Review of Systems: All systems reviewed and apart from history of presenting illness, are negative.  Past Medical History:  Diagnosis Date  . Diabetes mellitus without complication (HCC)    Type 2 IDDM x 5 years  . Glaucoma   . Goals of care, counseling/discussion 04/29/2019  . Herniated lumbar intervertebral disc 06/2014  . Multiple myeloma ([place]) 04/29/2019  . PONV  (postoperative nausea and vomiting)    Past Surgical History:  Procedure Laterality Date  . CHOLECYSTECTOMY    . COLONOSCOPY N/A 06/28/2015   Procedure: COLONOSCOPY;  Surgeon: Rogene Houston, MD;  [facility];  Service: Endoscopy;  Laterality: N/A;  155  . EYE SURGERY    . IR BONE TUMOR(S)RF ABLATION  04/28/2019  . IR KYPHO LUMBAR INC FX REDUCE BONE BX UNI/BIL CANNULATION INC/IMAGING  04/28/2019  . IR RADIOLOGIST EVAL & MGMT  04/22/2019  . LUMBAR LAMINECTOMY Right 07/12/2014   Procedure: LUMBAR FIVE TO SACRAL ONE MICRODISCECTOMY;  Surgeon: Marybelle Killings, MD;  Location: [facility];  Service: Orthopedics;  Laterality: Right;  . TUBAL LIGATION    . VITRECTOMY 25 GAUGE WITH SCLERAL BUCKLE Left 07/29/2017   Procedure: RETINAL DETACHMENT REPAIR LEFT EYE WITH PARSPLANA VITRECTOMY, AIR FLUID EXCHANGE, ENDO LASTER, DRAINAGE OF SUBRETINAL FLUID,ENDOLASER PPV /25 GAUGE;  Surgeon: Jalene Mullet, MD;  Location: [facility];  Service: Ophthalmology;  Laterality: Left;   Social History:  reports that she has been smoking cigarettes. She has a 10.00 pack-year smoking history. She has never used smokeless tobacco. She reports that she does not drink alcohol or use drugs.  Allergies  Allergen Reactions  . Metformin Nausea Only    "severe GI"    Family History  Problem Relation Age of Onset  . Hypertension Mother   . Stroke Mother   . Cancer Father  brain tumor  . Diabetes Father     Prior to Admission medications   Medication Sig Start Date End Date Taking? Authorizing Provider  ALPRAZolam Duanne Moron) 0.5 MG tablet Take 0.5 mg by mouth daily. 05/10/19   [provider]  aspirin 325 MG tablet Take 325 mg by mouth daily.    [provider]  atorvastatin (LIPITOR) 10 MG tablet Take 10 mg by mouth daily. 08/17/18   [provider]  B-D ULTRAFINE III SHORT PEN 31G X 8 MM MISC USE ONE PEN NEEDLE 4 TIMES DAILY 11/19/17   Cassandria Anger, MD  ciprofloxacin (CIPRO) 500 MG tablet  Take 500 mg by mouth 2 (two) times a day. 05/23/19   [provider]  Continuous Blood Gluc Sensor (FREESTYLE LIBRE SENSOR SYSTEM) MISC Use one sensor every 10 days. 10/14/17   Cassandria Anger, MD  famciclovir (FAMVIR) 250 MG tablet Take 1 tablet (250 mg total) by mouth daily. 04/29/19   [name], MD  glipiZIDE (GLUCOTROL XL) 10 MG 24 hr tablet TAKE 1 TABLET BY MOUTH ONCE DAILY FOR SUGARS 04/04/19   [provider]  HYDROcodone-acetaminophen (NORCO/VICODIN) 5-325 MG tablet Take 1 tablet by mouth every 6 (six) hours as needed for moderate pain. 04/22/19   [name], MD  insulin glargine (LANTUS) 100 UNIT/ML injection Inject 30 Units into the skin at bedtime.    [provider]  insulin lispro (HUMALOG KWIKPEN) 100 UNIT/ML KiwkPen Inject 0.22-0.28 mLs (22-28 Units total) into the skin 3 (three) times daily with meals. 01/19/18   Cassandria Anger, MD  latanoprost (XALATAN) 0.005 % ophthalmic solution Place 1 drop into both eyes at bedtime.    [provider]  lenalidomide (REVLIMID) 25 MG capsule Take 1 pill daily at bedtime for 21 days on and 7 days off. ZOXW#[id number] 05/23/19   [name], MD  lisinopril (PRINIVIL,ZESTRIL) 10 MG tablet TAKE 1 TABLET BY MOUTH ONCE DAILY 04/29/17   Cassandria Anger, MD  Multiple Vitamins-Minerals (PRESERVISION AREDS PO) Take by mouth 2 (two) times daily.    [provider]  NOVOLOG 100 UNIT/ML injection INJECT 35 UNITS SUBCUTANEOUSLY WITH MEALS 05/06/19   [provider]  ondansetron (ZOFRAN) 8 MG tablet Take 1 tablet (8 mg total) by mouth 2 (two) times daily as needed (Nausea or vomiting). 05/17/19   [name], MD  ondansetron (ZOFRAN) 8 MG tablet Take 1 tablet (8 mg total) by mouth 2 (two) times daily. 05/17/19   [name], MD  ONETOUCH VERIO test strip USE 1 STRIP TO CHECK GLUCOSE 4 TIMES DAILY 09/29/17   Cassandria Anger, MD  PARoxetine (PAXIL) 30 MG tablet Take 30 mg by  mouth every morning. 07/12/18   [provider]  prochlorperazine (COMPAZINE) 10 MG tablet Take 1 tablet (10 mg total) by mouth every 6 (six) hours as needed (Nausea or vomiting). 05/17/19   [name], MD  TRESIBA FLEXTOUCH 100 UNIT/ML SOPN FlexTouch Pen INJECT 35 UNITS SUBCUTANEOUSLY AT BEDTIME 05/10/19   [provider]  Vitamin D, Ergocalciferol, (DRISDOL) 50000 units CAPS capsule Take 50,000 Units by mouth once a week. Mondays 08/17/18   [provider]   Physical Exam: Vitals:   05/24/19 1239  BP: (!) 130/44  Pulse: 77  Temp: 99.3 F (37.4 C)  TempSrc: Oral  SpO2: 100%  Weight: 88.5 kg  Height: 5' 2"  (1.575 m)    General exam: Moderately built and nourished patient, lying comfortably supine on  the gurney in no obvious distress.  Head, eyes and ENT: Nontraumatic and normocephalic. Pupils equally reacting to light and accommodation. Oral mucosa moist.  Neck: Supple. No JVD, carotid bruit or thyromegaly.  Lymphatics: No lymphadenopathy.  Respiratory system: Clear to auscultation. No increased work of breathing.  Cardiovascular system: normal S1 and S2 heard, RRR. No JVD, murmurs, gallops, clicks or pedal edema.  Gastrointestinal system: Abdomen is nondistended, soft and nontender. Normal bowel sounds heard. No organomegaly or masses appreciated.  Central nervous system: Alert and oriented. No focal neurological deficits.  Extremities: Symmetric 5 x 5 power. Peripheral pulses symmetrically felt.   Skin: multiple small superficial skin abscesses and carbuncles seen, large right groin abscess nonfluctuant, open wound left upper arm, small carbuncule over right eyebrow.   Musculoskeletal system: Negative exam.  Psychiatry: Pleasant and cooperative.  Labs on Admission:  Basic Metabolic Panel: Recent Labs  Lab 05/24/19 0856  NA 138  K 3.2*  CL 103  CO2 27  GLUCOSE 276*  BUN 12  CREATININE 1.17*  CALCIUM 7.8*   Liver Function Tests:  Recent Labs  Lab 05/24/19 0856  AST 14*  ALT 23  ALKPHOS 100  BILITOT 0.6  PROT 6.9  ALBUMIN 3.4*   No results for input(s): LIPASE, AMYLASE in the last 168 hours. No results for input(s): AMMONIA in the last 168 hours. CBC: Recent Labs  Lab 05/24/19 0856  WBC 1.1*  NEUTROABS 0.6*  HGB 8.0*  HCT 23.9*  MCV 96.8  PLT 43*   Cardiac Enzymes: No results for input(s): CKTOTAL, CKMB, CKMBINDEX, TROPONINI in the last 168 hours.  BNP (last 3 results) No results for input(s): PROBNP in the last 8760 hours. CBG: No results for input(s): GLUCAP in the last 168 hours.  Radiological Exams on Admission: No results found.  Assessment/Plan Principal Problem:   Disseminated MRSA infection Active Problems:   Uncontrolled type 2 diabetes mellitus with complication, with long-term current use of insulin (HCC)   Essential hypertension, benign   Hypercortisolemia (HCC)   Current smoker   Cellulitis   Multiple myeloma ([place])   Diabetes mellitus without complication ([place])   Multiple Skin abscessed due to MRSA  1. Disseminated MRSA infection - Pt is known to be colonized with MRSA.  She was admitted earlier this year for IV antibiotics and treatments for MRSA skin infection requiring I&D of abscess. She now returns with recurrence of multiple skin abscesses and carbuncles and MRSA positive.  I have ordered blood cultures x 2.  She has been started on IV vancomycin per pharmacy protocol. She is pancytopenic and neutropenic making her High risk for developing overwhelming sepsis.  I have added a lactic acid to blood work.  Follow CBC/diff.  Continue topical intranasal bactroban and topical bactroban to skin lesions.  If no improvement in right inguinal area, would consult surgery for consideration of I&D.  Follow clinically.  Bathe patient with hibiclens.  2. Type 2 DM insulin requiring - Carb modified diet ordered, continue basal bolus insulin with supplemental sliding scale coverage.  Follow  CBG 5 times per day.  Hold home glipizide.   3. Hypokalemia - oral replacement given and K added to IV fluid.  Check magnesium. Follow BMP.   4. Multiple myeloma - Pt was sent over by her oncologist.  He has temporarily placed a hold on her Revlimib and temporarily holding her radiation treatments to her back.  5. GAD and depression - resumed home anxiolytic and SSRI.  6. Pancytopenia - We will  need to follow this closely.   Transfuse as needed. Dr. [name] felt this was related to her Revlimid which has been placed on hold.  Contact precautions recommended.  7. Essential hypertension - resume home blood pressure medications.   8. Tobacco - will offer nicotine patch, counseled at bedside for smoking cessation.   DVT Prophylaxis: SCDs (holding heparins due to thrombocytopenia) Code Status: Full   Family Communication: patient updated at bedside   Disposition Plan: inpatient medical treatments    Time spent: 55 minutes   Clanford Wynetta Emery, MD [facility] How to contact the [facility] Attending or Consulting provider [place] or covering provider during after hours [place], for this patient?  1. Check the care team in [facility] and look for a) attending/consulting [facility] provider listed and b) the [facility] team listed 2. Log into [email or web address] and use [place]'s universal password to access. If you do not have the password, please contact the hospital operator. 3. Locate the [facility] provider you are looking for under [facility] and page to a number that you can be directly reached. 4. If you still have difficulty reaching the provider, please page the [facility] (Director on Call) for the Hospitalists listed on amion for assistance.

## 2019-05-24 NOTE — Telephone Encounter (Signed)
Keep prior appt that was set up per 6/2 los

## 2019-05-24 NOTE — ED Provider Notes (Signed)
[facility] EMERGENCY DEPARTMENT Provider Note   CSN: [id number] Arrival date & time: 05/24/19  1218    History   Chief Complaint Chief Complaint  Patient presents with   Rash    HPI Vicki Moon is a 68 y.o. female.     HPI  Patient presents for evaluation of multiple skin infections, felt to be MRSA, by her oncologist who saw her this morning.  He felt that she needed parenteral antibiotics to treat this problem.  She had a similar problem requiring hospitalization in 12/19.  She is currently taking Cipro for this problem and not getting any better.  She is currently on chemotherapy, and has had pancytopenia, and was told to stop taking Revlimid today.  She is also getting radiation therapy for a back lesion secondary to multiple myeloma.  Today she had labs done and was found to have worsening ANC, with pancytopenia.  She states that she began having this episode of MRSA, about a week ago and started Cipro at that time.  She feels like she is having more lesions, now.  Currently she is bothered by a right inguinal lesion the most.  She also has sores on her bilateral ears, her left upper arm, and her forehead.  She denies fever, chills, cough, shortness of breath, abdominal or back pain.  Her first episode of MRSA was in December 2019 at which time she had an I&D of the right axilla.  She states she was hospitalized for a week at that time.  Since then she has not had other episodes of MRSA, and has not been treated with nasal Bactroban.  There are no other known modifying factors.  Past Medical History:  Diagnosis Date   Diabetes mellitus without complication ([place])    Type 2 IDDM x 5 years   Glaucoma    Goals of care, counseling/discussion 04/29/2019   Herniated lumbar intervertebral disc 06/2014   Multiple myeloma ([place]) 04/29/2019   PONV (postoperative nausea and vomiting)     Patient Active Problem List   Diagnosis Date Noted   Multiple myeloma ([place]) 04/29/2019    Goals of care, counseling/discussion 04/29/2019   Pain in abdominal muscle of right flank 03/08/2019   Abscess of right axilla    Cellulitis of axilla, right    Cellulitis 12/17/2018   AKI (acute kidney injury) ([place]) 12/17/2018   Anemia 12/17/2018   Type 2 diabetes mellitus ([place]) 12/17/2018   Depression 12/17/2018   Trochanteric bursitis, right hip 08/12/2018   Post-menopausal 01/19/2018   Current smoker 01/19/2018   Mixed hyperlipidemia 07/13/2017   Class 2 severe obesity due to excess calories with serious comorbidity and body mass index (BMI) of 37.0 to 37.9 in adult ([place]) 07/13/2017   Hypercortisolemia ([place]) 01/20/2017   Excessive weight gain 01/20/2017   Generalized abdominal pain 01/20/2017   Uncontrolled type 2 diabetes mellitus with complication, with long-term current use of insulin ([place]) 12/20/2015   Essential hypertension, benign 12/20/2015   Vitamin D deficiency 12/20/2015   Rectal bleeding 05/29/2015   HNP (herniated nucleus pulposus), lumbar 07/12/2014    Class: Diagnosis of    Past Surgical History:  Procedure Laterality Date   CHOLECYSTECTOMY     COLONOSCOPY N/A 06/28/2015   Procedure: COLONOSCOPY;  Surgeon: Rogene Houston, MD;  [facility];  Service: Endoscopy;  Laterality: N/A;  155   EYE SURGERY     IR BONE TUMOR(S)RF ABLATION  04/28/2019   IR KYPHO LUMBAR INC FX REDUCE BONE BX  UNI/BIL CANNULATION INC/IMAGING  04/28/2019   IR RADIOLOGIST EVAL & MGMT  04/22/2019   LUMBAR LAMINECTOMY Right 07/12/2014   Procedure: LUMBAR FIVE TO SACRAL ONE MICRODISCECTOMY;  Surgeon: Marybelle Killings, MD;  Location: [place];  Service: Orthopedics;  Laterality: Right;   TUBAL LIGATION     VITRECTOMY 25 GAUGE WITH SCLERAL BUCKLE Left 07/29/2017   Procedure: RETINAL DETACHMENT REPAIR LEFT EYE WITH PARSPLANA VITRECTOMY, AIR FLUID EXCHANGE, ENDO LASTER, DRAINAGE OF SUBRETINAL FLUID,ENDOLASER PPV /25 GAUGE;  Surgeon: Jalene Mullet, MD;  Location: [place];   Service: Ophthalmology;  Laterality: Left;     OB History   No obstetric history on file.      Home Medications    Prior to Admission medications   Medication Sig Start Date End Date Taking? Authorizing Provider  ALPRAZolam Duanne Moron) 0.5 MG tablet Take 0.5 mg by mouth daily. 05/10/19   [provider]  aspirin 325 MG tablet Take 325 mg by mouth daily.    [provider]  atorvastatin (LIPITOR) 10 MG tablet Take 10 mg by mouth daily. 08/17/18   [provider]  B-D ULTRAFINE III SHORT PEN 31G X 8 MM MISC USE ONE PEN NEEDLE 4 TIMES DAILY 11/19/17   Cassandria Anger, MD  ciprofloxacin (CIPRO) 500 MG tablet Take 500 mg by mouth 2 (two) times a day. 05/23/19   [provider]  Continuous Blood Gluc Sensor (FREESTYLE LIBRE SENSOR SYSTEM) MISC Use one sensor every 10 days. 10/14/17   Cassandria Anger, MD  famciclovir (FAMVIR) 250 MG tablet Take 1 tablet (250 mg total) by mouth daily. 04/29/19   [name], MD  glipiZIDE (GLUCOTROL XL) 10 MG 24 hr tablet TAKE 1 TABLET BY MOUTH ONCE DAILY FOR SUGARS 04/04/19   [provider]  HYDROcodone-acetaminophen (NORCO/VICODIN) 5-325 MG tablet Take 1 tablet by mouth every 6 (six) hours as needed for moderate pain. 04/22/19   [name], MD  insulin glargine (LANTUS) 100 UNIT/ML injection Inject 30 Units into the skin at bedtime.    [provider]  insulin lispro (HUMALOG KWIKPEN) 100 UNIT/ML KiwkPen Inject 0.22-0.28 mLs (22-28 Units total) into the skin 3 (three) times daily with meals. 01/19/18   Cassandria Anger, MD  latanoprost (XALATAN) 0.005 % ophthalmic solution Place 1 drop into both eyes at bedtime.    [provider]  lenalidomide (REVLIMID) 25 MG capsule Take 1 pill daily at bedtime for 21 days on and 7 days off. BVQX#[id number] 05/23/19   [name], MD  lisinopril (PRINIVIL,ZESTRIL) 10 MG tablet TAKE 1 TABLET BY MOUTH ONCE DAILY 04/29/17   Cassandria Anger, MD    Multiple Vitamins-Minerals (PRESERVISION AREDS PO) Take by mouth 2 (two) times daily.    [provider]  NOVOLOG 100 UNIT/ML injection INJECT 35 UNITS SUBCUTANEOUSLY WITH MEALS 05/06/19   [provider]  ondansetron (ZOFRAN) 8 MG tablet Take 1 tablet (8 mg total) by mouth 2 (two) times daily as needed (Nausea or vomiting). 05/17/19   [name], MD  ondansetron (ZOFRAN) 8 MG tablet Take 1 tablet (8 mg total) by mouth 2 (two) times daily. 05/17/19   [name], MD  ONETOUCH VERIO test strip USE 1 STRIP TO CHECK GLUCOSE 4 TIMES DAILY 09/29/17   Cassandria Anger, MD  PARoxetine (PAXIL) 30 MG tablet Take 30 mg by mouth every morning. 07/12/18   [provider]  prochlorperazine (COMPAZINE) 10 MG tablet Take 1 tablet (10 mg total) by  mouth every 6 (six) hours as needed (Nausea or vomiting). 05/17/19   [name], MD  TRESIBA FLEXTOUCH 100 UNIT/ML SOPN FlexTouch Pen INJECT 35 UNITS SUBCUTANEOUSLY AT BEDTIME 05/10/19   [provider]  Vitamin D, Ergocalciferol, (DRISDOL) 50000 units CAPS capsule Take 50,000 Units by mouth once a week. Mondays 08/17/18   [provider]    Family History Family History  Problem Relation Age of Onset   Hypertension Mother    Stroke Mother    Cancer Father        brain tumor   Diabetes Father     Social History Social History   Tobacco Use   Smoking status: Current Some Day Smoker    Packs/day: 1.00    Years: 10.00    Pack years: 10.00    Types: Cigarettes   Smokeless tobacco: Never Used   Tobacco comment:  1/2 pack a day since age 32  Substance Use Topics   Alcohol use: No    Alcohol/week: 0.0 standard drinks   Drug use: No     Allergies   Metformin   Review of Systems Review of Systems  All other systems reviewed and are negative.    Physical Exam Updated Vital Signs BP (!) 130/44 (BP Location: Right Arm)    Pulse 77    Temp 99.3 F (37.4 C) (Oral)    Ht '5\' 2"'$  (1.575  m)    Wt 88.5 kg    SpO2 100%    BMI 35.67 kg/m   Physical Exam Vitals signs and nursing note reviewed.  Constitutional:      General: She is not in acute distress.    Appearance: She is well-developed. She is obese. She is not ill-appearing, toxic-appearing or diaphoretic.  HENT:     Head: Normocephalic and atraumatic.     Right Ear: External ear normal.     Left Ear: External ear normal.     Nose: Nose normal. No congestion.     Mouth/Throat:     Mouth: Mucous membranes are moist.     Pharynx: No oropharyngeal exudate or posterior oropharyngeal erythema.  Eyes:     Conjunctiva/sclera: Conjunctivae normal.     Pupils: Pupils are equal, round, and reactive to light.  Neck:     Musculoskeletal: Normal range of motion and neck supple.     Trachea: Phonation normal.  Cardiovascular:     Rate and Rhythm: Normal rate and regular rhythm.     Heart sounds: Normal heart sounds.  Pulmonary:     Effort: Pulmonary effort is normal.     Breath sounds: Normal breath sounds.  Abdominal:     Palpations: Abdomen is soft.     Tenderness: There is no abdominal tenderness.  Musculoskeletal: Normal range of motion.  Skin:    General: Skin is warm and dry.     Comments: Right groin with mild diffuse erythema an area about 4 x 4 cm, with a small central nodule along the inguinal canal, about 1 x 1 cm.  There is no drainage at this site.  Both patient has, have superficial areas of redness, with slight induration, but no palpable abscess or drainage.  Left upper arm has a open wound, about 1/2 cm in diameter, which is not currently draining.  She has a minuscule area above her right eyebrow, which is slightly tender and has normal overlying skin color.  Neurological:     Mental Status: She is alert and oriented to person, place, and  time.     Cranial Nerves: No cranial nerve deficit.     Sensory: No sensory deficit.     Motor: No abnormal muscle tone.     Coordination: Coordination normal.    Psychiatric:        Mood and Affect: Mood normal.        Behavior: Behavior normal.        Thought Content: Thought content normal.        Judgment: Judgment normal.      ED Treatments / Results  Labs (all labs ordered are listed, but only abnormal results are displayed) Labs Reviewed  SARS CORONAVIRUS 2 (HOSPITAL ORDER, [place] [facility])    EKG None  Radiology No results found.  Procedures Procedures (including critical care time)  Medications Ordered in ED Medications  potassium chloride SA (K-DUR) CR tablet 40 mEq (has no administration in time range)  sodium chloride 0.9 % bolus 500 mL (has no administration in time range)  mupirocin cream (BACTROBAN) 2 % (has no administration in time range)  mupirocin ointment (BACTROBAN) 2 % (has no administration in time range)     Initial Impression / Assessment and Plan / ED Course  I have reviewed the triage vital signs and the nursing notes.  Pertinent labs & imaging results that were available during my care of the patient were reviewed by me and considered in my medical decision making (see chart for details).         Patient Vitals for the past 24 hrs:  BP Temp Temp src Pulse SpO2 Height Weight  05/24/19 1239 (!) 130/44 99.3 F (37.4 C) Oral 77 100 % _0  (1.575 m) 88.5 kg    2:25 PM Reevaluation with update and discussion. After initial assessment and treatment, an updated evaluation reveals no change in clinical status, findings discussed and questions answered. Daleen Bo   Medical Decision Making: Nonspecific skin nodules, possibly consistent with MRSA infection.  Patient currently being treated with chemotherapy for multiple myeloma.  Today she was due to get a Velcade injection, but could not do that because of worsening pancytopenia.  Labs available, in epi-white count 1.1, hemoglobin 8.0, platelets 43, ANC 0.6.  Electrolytes remarkable for low potassium, hyperglycemia, elevated  creatinine, low calcium, low albumin  Vicki Moon was evaluated in Emergency Department on 05/24/2019 for the symptoms described in the history of present illness. She was evaluated in the context of the global COVID-19 pandemic, which necessitated consideration that the patient might be at risk for infection with the SARS-CoV-2 virus that causes COVID-19. Institutional protocols and algorithms that pertain to the evaluation of patients at risk for COVID-19 are in a state of rapid change based on information released by regulatory bodies including the CDC and federal and state organizations. These policies and algorithms were followed during the patient's care in the ED.   CRITICAL CARE-no Performed by: Daleen Bo  Nursing Notes Reviewed/ Care Coordinated Applicable Imaging Reviewed Interpretation of Laboratory Data incorporated into ED treatment   2:26 PM-Consult complete with hospitalist. Patient case explained and discussed.  He agrees to admit patient for further evaluation and treatment. Call ended at 2:39 PM  Plan: Admit for observation  Final Clinical Impressions(s) / ED Diagnoses   Final diagnoses:  Pancytopenia ([place])  Rash  Hypokalemia    ED Discharge Orders    None       Daleen Bo, MD 05/24/19 1439

## 2019-05-24 NOTE — Progress Notes (Signed)
Pharmacy Antibiotic Note  Vicki Moon is a 68 y.o. female admitted on 05/24/2019 with chronic wound infections complicated by multiple myeloma.  Pharmacy has been consulted for vancomycin dosing.  Plan: Vancomycin 744m IV every 12 hours.  Goal trough 15-20 mcg/mL.  Height: 5' 2"  (157.5 cm) Weight: 195 lb (88.5 kg) IBW/kg (Calculated) : 50.1  Temp (24hrs), Avg:99.6 F (37.6 C), Min:99.3 F (37.4 C), Max:100 F (37.8 C)  Recent Labs  Lab 05/24/19 0856 05/24/19 1520  WBC 1.1*  --   CREATININE 1.17*  --   LATICACIDVEN  --  1.3    Estimated Creatinine Clearance: 46.3 mL/min (A) (by C-G formula based on SCr of 1.17 mg/dL (H)).    Allergies  Allergen Reactions  . Metformin Nausea Only    "severe GI"    Antimicrobials this admission: 6/2 vancomycin >>   Microbiology results:  6/2 BCx: sent  6/2 Covid 19 negative   Thank you for allowing pharmacy to be a part of this patient's care.  GDonna ChristenCoffee 05/24/2019 4:12 PM

## 2019-05-25 ENCOUNTER — Inpatient Hospital Stay (HOSPITAL_COMMUNITY): Payer: Medicare Other

## 2019-05-25 DIAGNOSIS — L0292 Furuncle, unspecified: Secondary | ICD-10-CM

## 2019-05-25 LAB — CBC WITH DIFFERENTIAL/PLATELET
Abs Immature Granulocytes: 0.03 10*3/uL (ref 0.00–0.07)
Basophils Absolute: 0 10*3/uL (ref 0.0–0.1)
Basophils Relative: 1 %
Eosinophils Absolute: 0 10*3/uL (ref 0.0–0.5)
Eosinophils Relative: 3 %
HCT: 23.6 % — ABNORMAL LOW (ref 36.0–46.0)
Hemoglobin: 7.5 g/dL — ABNORMAL LOW (ref 12.0–15.0)
Immature Granulocytes: 3 %
Lymphocytes Relative: 25 %
Lymphs Abs: 0.2 10*3/uL — ABNORMAL LOW (ref 0.7–4.0)
MCH: 31.6 pg (ref 26.0–34.0)
MCHC: 31.8 g/dL (ref 30.0–36.0)
MCV: 99.6 fL (ref 80.0–100.0)
Monocytes Absolute: 0.2 10*3/uL (ref 0.1–1.0)
Monocytes Relative: 18 %
Neutro Abs: 0.5 10*3/uL — ABNORMAL LOW (ref 1.7–7.7)
Neutrophils Relative %: 50 %
Platelets: 38 10*3/uL — ABNORMAL LOW (ref 150–400)
RBC: 2.37 MIL/uL — ABNORMAL LOW (ref 3.87–5.11)
RDW: 14.9 % (ref 11.5–15.5)
WBC: 1 10*3/uL — CL (ref 4.0–10.5)
nRBC: 0 % (ref 0.0–0.2)

## 2019-05-25 LAB — COMPREHENSIVE METABOLIC PANEL
ALT: 36 U/L (ref 0–44)
AST: 25 U/L (ref 15–41)
Albumin: 2.5 g/dL — ABNORMAL LOW (ref 3.5–5.0)
Alkaline Phosphatase: 92 U/L (ref 38–126)
Anion gap: 8 (ref 5–15)
BUN: 16 mg/dL (ref 8–23)
CO2: 22 mmol/L (ref 22–32)
Calcium: 7.7 mg/dL — ABNORMAL LOW (ref 8.9–10.3)
Chloride: 106 mmol/L (ref 98–111)
Creatinine, Ser: 2.17 mg/dL — ABNORMAL HIGH (ref 0.44–1.00)
GFR calc Af Amer: 26 mL/min — ABNORMAL LOW (ref >60)
GFR calc non Af Amer: 22 mL/min — ABNORMAL LOW (ref >60)
Glucose, Bld: 162 mg/dL — ABNORMAL HIGH (ref 70–99)
Potassium: 4.1 mmol/L (ref 3.5–5.1)
Sodium: 136 mmol/L (ref 135–145)
Total Bilirubin: 0.7 mg/dL (ref 0.3–1.2)
Total Protein: 5.9 g/dL — ABNORMAL LOW (ref 6.5–8.1)

## 2019-05-25 LAB — HEMOGLOBIN A1C
Hgb A1c MFr Bld: 8.5 % — ABNORMAL HIGH (ref 4.8–5.6)
Mean Plasma Glucose: 197.25 mg/dL

## 2019-05-25 LAB — GLUCOSE, CAPILLARY
Glucose-Capillary: 139 mg/dL — ABNORMAL HIGH (ref 70–99)
Glucose-Capillary: 168 mg/dL — ABNORMAL HIGH (ref 70–99)
Glucose-Capillary: 106 mg/dL — ABNORMAL HIGH (ref 70–99)
Glucose-Capillary: 85 mg/dL (ref 70–99)
Glucose-Capillary: 122 mg/dL — ABNORMAL HIGH (ref 70–99)

## 2019-05-25 LAB — MAGNESIUM: Magnesium: 1.7 mg/dL (ref 1.7–2.4)

## 2019-05-25 MED ORDER — VANCOMYCIN HCL IN DEXTROSE 1-5 GM/200ML-% IV SOLN
1000.0000 mg | INTRAVENOUS | Status: DC
  Administered 2019-05-26: 1000 mg via INTRAVENOUS
  Filled 2019-05-25: qty 200

## 2019-05-25 NOTE — Progress Notes (Signed)
Initial Nutrition Assessment  DOCUMENTATION CODES:   Obesity unspecified  INTERVENTION:  Boost with meals (prefers chocolate) and add Jello with dinner   NUTRITION DIAGNOSIS:   Increased nutrient needs related to cancer and cancer related treatments as evidenced by estimated needs.  GOAL:   Patient will meet greater than or equal to 90% of their needs  MONITOR:   PO intake, Supplement acceptance, I & O's, Labs, Skin, Weight trends  REASON FOR ASSESSMENT:   Malnutrition Screening Tool    ASSESSMENT: Patient is an obese 68 yo female with multiple myeloma and  Diabetes and CKD. She presents with recurrent skin infection and AKI.   Patient is from home with husband. She is undergoing radiation treatment in [place] where she lives and her oncologist is on [employer]. Patient says she had completed ~21 days of chemo. Complains of associated taste changes and intermittent nausea. Meal intake poor during admission 0-10% documented. Jello, chicken broth and saltine crackers are items that seem good to pt but unfortunately are not very nutrient dense. She is agreeable to receive Boost with meals to increase nutrition intake while her appetite is diminished.  Patient reports usual weight around 195 lb which is her current weight. She is at risk for unwanted wt loss related to her current altered taste, diminished appetite and intake. She is drinking well and says she is often thirsty.   Medications reviewed and include: Lipitor, famciclovir, Novolog and Lantus  Insulin, Nicoderm.  Labs: BMP Latest Ref Rng & Units 05/26/2019 05/25/2019 05/24/2019  Glucose 70 - 99 mg/dL 123(H) 162(H) 276(H)  BUN 8 - 23 mg/dL 24(H) 16 12  Creatinine 0.44 - 1.00 mg/dL 3.86(H) 2.17(H) 1.17(H)  BUN/Creat Ratio 6 - 22 (calc) - - -  Sodium 135 - 145 mmol/L 136 136 138  Potassium 3.5 - 5.1 mmol/L 4.0 4.1 3.2(L)  Chloride 98 - 111 mmol/L 109 106 103  CO2 22 - 32 mmol/L 20(L) 22 27  Calcium 8.9 - 10.3 mg/dL 7.5(L)  7.7(L) 7.8(L)    NUTRITION - FOCUSED PHYSICAL EXAM:  Unable to complete Nutrition-Focused physical exam at this time.    Diet Order:   Diet Order            Diet Carb Modified Fluid consistency: Thin; Room service appropriate? Yes  Diet effective now              EDUCATION NEEDS:   Education needs have been addressed Skin:  Skin Assessment: Reviewed RN Assessment(rash and boils to multiple areas upper and lower body)  Last BM:  6/1  Height:   Ht Readings from Last 1 Encounters:  05/24/19 _0  (1.575 m)    Weight:   Wt Readings from Last 1 Encounters:  05/24/19 88.5 kg    Ideal Body Weight:  50 kg  BMI:  Body mass index is 35.67 kg/m.  Estimated Nutritional Needs:   Kcal:  8119-1478 (MSJ X1.1-1.2 AF)  Protein:  80-90 gr (1.6-1.8 gr/kg/ibw)  Fluid:  1.7-1.8 liters daily  Colman Cater MS,RD,CSG,LDN Office: [phone] Pager: [phone]

## 2019-05-25 NOTE — Progress Notes (Signed)
PROGRESS NOTE  Vicki Moon TDH:[id number] DOB: [birth year] DOA: 05/24/2019 PCP: [name], MD  Brief History:  68 year old female with a history of diabetes mellitus, myeloma, and MRSA skin infections presenting with cellulitis and increasing number of furuncles.  The patient went to see her oncologist, Dr. [name] on 05/24/2019.  She was scheduled to receive her dose of Velcade.  However, she was noted to have increasing number of furuncles on her skin.  As result, her Velcade was held.  It was also held in part due to the patient's pancytopenia.  Because of increasing number of furuncles, the patient was recommended to go to the emergency department for further evaluation and intravenous antibiotics.  Upon presentation, the patient had temperature 100.0 F but was hemodynamically stable.  WBC was the 1.1.  The patient was started on vancomycin IV.  Assessment/Plan: Cellulitis right groin/furunculosis -Continue vancomycin IV -Continue mupirocin nasal therapy for decolonization -Chlorhexidine baths  AKI -baseline creatinine 0.9-1.1 -serum creatinine peaked 2.17 -d/c lisinopril -renal US -continue IVF  IgG kappa myeloma -Patient followed by Dr. [name] -Started on Revlimid 05/09/2019 -05/12/2019.  PET scan showed patchy areas of hypermetabolism throughout the osseous skeleton -05/04/19 BM biopsy--showed marked increase in plasma cells.  I am no histochemistry stain, she had 90% plasma cells. -received Xgeva 06/19/19  Pancytopenia -likely due to Revlimid--stopped 6/2 by Dr. [name]  Diabetes mellitus type 2, uncontrolled with hyperglycemia -12/18/2018--hemoglobin A1c 8.1 -Continue Lantus  -Continue NovoLog sliding scale -repeat A1C  Hyperlipidemia -Continue statin  Essential hypertension -Holding lisinopril secondary to acute kidney injury      Disposition Plan:   Home in 2-3 days  Family Communication:  No Family at bedside  Consultants:  none  Code  Status:  FULL  DVT Prophylaxis:  SCDS   Procedures: As Listed in Progress Note Above  Antibiotics: vanco 6/2>>>     Subjective:  Patient denies fevers, chills, headache, chest pain, dyspnea, nausea, vomiting, diarrhea, abdominal pain, dysuria, hematuria, hematochezia, and melena.  Objective: Vitals:   05/24/19 1239 05/24/19 1608 05/24/19 2100 05/25/19 0527  BP: (!) 130/44 (!) 141/47 (!) 106/48 (!) 96/45  Pulse: 77 81 76 82  Resp:  20 (!) 24 20  Temp: 99.3 F (37.4 C) 100 F (37.8 C) 98.1 F (36.7 C) 98.8 F (37.1 C)  TempSrc: Oral Oral Oral Oral  SpO2: 100% 100% 98% 96%  Weight: 88.5 kg     Height: 5' 2"  (1.575 m)       Intake/Output Summary (Last 24 hours) at 05/25/2019 1431 Last data filed at 05/25/2019 0900 Gross per 24 hour  Intake 1438.08 ml  Output --  Net 1438.08 ml   Weight change:  Exam:   General:  Pt is alert, follows commands appropriately, not in acute distress  HEENT: No icterus, No thrush, No neck mass, [place]/AT  Cardiovascular: RRR, S1/S2, no rubs, no gallops  Respiratory: CTA bilaterally, no wheezing, no crackles, no rhonchi  Abdomen: Soft/+BS, non tender, non distended, no guarding  Extremities: No edema, No lymphangitis, No petechiae, No rashes, no synovitis  -right groin furuncle 2x2 cm without pus or necrosis   Data Reviewed: I have personally reviewed following labs and imaging studies Basic Metabolic Panel: Recent Labs  Lab 05/24/19 0856 05/24/19 1519 05/25/19 0443  NA 138  --  136  K 3.2*  --  4.1  CL 103  --  106  CO2 27  --  22  GLUCOSE 276*  --  162*  BUN 12  --  16  CREATININE 1.17*  --  2.17*  CALCIUM 7.8*  --  7.7*  MG  --  1.7 1.7   Liver Function Tests: Recent Labs  Lab 05/24/19 0856 05/25/19 0443  AST 14* 25  ALT 23 36  ALKPHOS 100 92  BILITOT 0.6 0.7  PROT 6.9 5.9*  ALBUMIN 3.4* 2.5*   No results for input(s): LIPASE, AMYLASE in the last 168 hours. No results for input(s): AMMONIA in the last 168  hours. Coagulation Profile: No results for input(s): INR, PROTIME in the last 168 hours. CBC: Recent Labs  Lab 05/24/19 0856 05/25/19 0443  WBC 1.1* 1.0*  NEUTROABS 0.6* 0.5*  HGB 8.0* 7.5*  HCT 23.9* 23.6*  MCV 96.8 99.6  PLT 43* 38*   Cardiac Enzymes: No results for input(s): CKTOTAL, CKMB, CKMBINDEX, TROPONINI in the last 168 hours. BNP: Invalid input(s): POCBNP CBG: Recent Labs  Lab 05/24/19 1628 05/24/19 2101 05/25/19 0252 05/25/19 0717 05/25/19 1109  GLUCAP 88 157* 139* 168* 106*   HbA1C: Recent Labs    05/24/19 1519  HGBA1C 8.5*   Urine analysis:    Component Value Date/Time   COLORURINE YELLOW 07/11/2014 1440   APPEARANCEUR HAZY (A) 07/11/2014 1440   LABSPEC 1.017 07/11/2014 1440   PHURINE 5.5 07/11/2014 1440   GLUCOSEU NEGATIVE 07/11/2014 1440   HGBUR NEGATIVE 07/11/2014 1440   BILIRUBINUR NEGATIVE 07/11/2014 1440   KETONESUR NEGATIVE 07/11/2014 1440   PROTEINUR NEGATIVE 07/11/2014 1440   UROBILINOGEN 0.2 07/11/2014 1440   NITRITE NEGATIVE 07/11/2014 1440   LEUKOCYTESUR NEGATIVE 07/11/2014 1440   Sepsis Labs: @LABRCNTIP (procalcitonin:4,lacticidven:4) ) Recent Results (from the past 240 hour(s))  SARS Coronavirus 2 (CEPHEID - Performed in [place] hospital lab), Hosp Order     Status: None   Collection Time: 05/24/19  2:19 PM  Result Value Ref Range Status   SARS Coronavirus 2 NEGATIVE NEGATIVE Final    Comment: (NOTE) If result is NEGATIVE SARS-CoV-2 target nucleic acids are NOT DETECTED. The SARS-CoV-2 RNA is generally detectable in upper and lower  respiratory specimens during the acute phase of infection. The lowest  concentration of SARS-CoV-2 viral copies this assay can detect is 250  copies / mL. A negative result does not preclude SARS-CoV-2 infection  and should not be used as the sole basis for treatment or other  patient management decisions.  A negative result may occur with  improper specimen collection / handling, submission  of specimen other  than nasopharyngeal swab, presence of viral mutation(s) within the  areas targeted by this assay, and inadequate number of viral copies  (<250 copies / mL). A negative result must be combined with clinical  observations, patient history, and epidemiological information. If result is POSITIVE SARS-CoV-2 target nucleic acids are DETECTED. The SARS-CoV-2 RNA is generally detectable in upper and lower  respiratory specimens dur ing the acute phase of infection.  Positive  results are indicative of active infection with SARS-CoV-2.  Clinical  correlation with patient history and other diagnostic information is  necessary to determine patient infection status.  Positive results do  not rule out bacterial infection or co-infection with other viruses. If result is PRESUMPTIVE POSTIVE SARS-CoV-2 nucleic acids MAY BE PRESENT.   A presumptive positive result was obtained on the submitted specimen  and confirmed on repeat testing.  While 2019 novel coronavirus  (SARS-CoV-2) nucleic acids may be present in the submitted sample  additional confirmatory testing may be necessary for epidemiological  and /  or clinical management purposes  to differentiate between  SARS-CoV-2 and other Sarbecovirus currently known to infect humans.  If clinically indicated additional testing with an alternate test  methodology [phone]) is advised. The SARS-CoV-2 RNA is generally  detectable in upper and lower respiratory sp ecimens during the acute  phase of infection. The expected result is Negative. Fact Sheet for Patients:  StrictlyIdeas.no Fact Sheet for Healthcare Providers: BankingDealers.co.za This test is not yet approved or cleared by the [place] FDA and has been authorized for detection and/or diagnosis of SARS-CoV-2 by FDA under an Emergency Use Authorization (EUA).  This EUA will remain in effect (meaning this test can be used) for  the duration of the COVID-19 declaration under Section 564(b)(1) of the Act, 21 U.S.C. section 360bbb-3(b)(1), unless the authorization is terminated or revoked sooner. Performed at [facility], [address] [place], [place] [address]   Culture, blood (Routine X 2) w Reflex to ID Panel     Status: None (Preliminary result)   Collection Time: 05/24/19  3:19 PM  Result Value Ref Range Status   Specimen Description BLOOD RIGHT ANTECUBITAL  Final   Special Requests   Final    BOTTLES DRAWN AEROBIC AND ANAEROBIC Blood Culture adequate volume   Culture   Final    NO GROWTH < 24 HOURS Performed at [facility], [address] [place], [place] [address]    Report Status PENDING  Incomplete  Culture, blood (Routine X 2) w Reflex to ID Panel     Status: None (Preliminary result)   Collection Time: 05/24/19  3:19 PM  Result Value Ref Range Status   Specimen Description BLOOD BLOOD RIGHT WRIST  Final   Special Requests   Final    BOTTLES DRAWN AEROBIC AND ANAEROBIC Blood Culture adequate volume   Culture   Final    NO GROWTH < 24 HOURS Performed at [facility], [address] [place], [place] [address]    Report Status PENDING  Incomplete     Scheduled Meds:  aspirin  325 mg Oral Daily   atorvastatin  10 mg Oral q1800   famciclovir  250 mg Oral Daily   insulin aspart  0-15 Units Subcutaneous TID WC   insulin aspart  0-5 Units Subcutaneous QHS   insulin aspart  8 Units Subcutaneous TID WC   insulin glargine  25 Units Subcutaneous QHS   latanoprost  1 drop Both Eyes QHS   lisinopril  10 mg Oral Daily   mupirocin cream   Topical BID   mupirocin ointment   Nasal BID   PARoxetine  30 mg Oral q morning - 10a   Continuous Infusions:  0.9 % NaCl with KCl 20 mEq / L Stopped (05/24/19 1637)   [START ON 05/26/2019] vancomycin      Procedures/Studies: Nm Pet Image Initial (pi) Whole Body  Result Date: 05/12/2019 CLINICAL DATA:  Initial treatment strategy for multiple  myeloma. EXAM: NUCLEAR MEDICINE PET WHOLE BODY TECHNIQUE: 10.0 mCi F-18 FDG was injected intravenously. Full-ring PET imaging was performed from the skull base to thigh after the radiotracer. CT data was obtained and used for attenuation correction and anatomic localization. Fasting blood glucose: 198 mg/dl COMPARISON:  CT chest abdomen pelvis 04/25/2019. FINDINGS: Mediastinal blood pool activity: SUV max 3.9 HEAD/NECK: Mildly asymmetric uptake within the left nasopharynx, without a CT correlate. Otherwise, abnormal hypermetabolism in the head/neck. Incidental CT findings: None. CHEST: No hypermetabolic mediastinal, hilar or axillary lymph nodes. No hypermetabolic pulmonary nodules. Incidental CT  findings: Atherosclerotic calcification of the aorta and coronary arteries. Heart is enlarged. No pericardial or pleural effusion. There may be a few scattered millimetric pulmonary nodules which are distorted due to respiratory motion and expiratory phase imaging. ABDOMEN/PELVIS: No abnormal hypermetabolism in the liver, adrenal glands, spleen or pancreas. No hypermetabolic pathologically enlarged lymph nodes. Incidental CT findings: Liver, adrenal glands and right kidney are unremarkable. Tiny stone in the lower pole left kidney. Spleen, pancreas, stomach and bowel are otherwise unremarkable. Cholecystectomy. Uterus is visualized. There may be bladder wall thickening but the bladder is poorly distended. SKELETON: Focal uptake in anterior right ribs are associated with probable old fractures. Mild hypermetabolism associated with an L2 vertebral body augmentation. Incidental CT findings: L2 vertebral body augmentation. A 12 mm lytic lesion in the left aspect of S1 (series 4, image 181) is again seen and without definitive hypermetabolism. Subtle lysis within the inferior endplate of L5 is better seen on the comparison exam. EXTREMITIES: No abnormal hypermetabolism. Incidental CT findings: There is focal atrophy of the right  calf musculature. IMPRESSION: 1. Patchy hypermetabolism throughout the visualized osseous structures. L5 and S1 lucencies do not appear focally hypermetabolic. 2. Left renal stone. 3. Aortic atherosclerosis (ICD10-170.0). Coronary artery calcification. Electronically Signed   By: Lorin Picket M.D.   On: 05/12/2019 09:10   Ir Bone Tumor(s)rf Ablation  Result Date: 04/28/2019 CLINICAL DATA:  68 year old female with an acute, painful pathologic fracture at L2. MRI imaging demonstrates a diffuse infiltrative marrow process concerning for multiple myeloma or lymphoma. She presents for transpedicular L2 biopsy, radiofrequency ablation and cement augmentation with kyphoplasty. EXAM: FLUOROSCOPIC GUIDED L2 VERTEBRAL BODY BIOPSY RF ABLATION AND KYPHOPLASTY/CEMENT AUGMENTATION. COMPARISON:  MRI lumbar spine 04/15/2019 MEDICATIONS: Ancef 2 g IV; The antibiotic was administered in an appropriate time interval prior to needle puncture of the skin. ANESTHESIA/SEDATION: Versed 4 mg IV; Fentanyl 200 mcg IV Moderate Sedation Time: 52 minutes; The patient was continuously monitored during the procedure by the interventional radiology nurse under my direct supervision. FLUOROSCOPY TIME:  Fluoroscopy Time: 8 minutes 30 seconds (661 mGy). COMPLICATIONS: None immediate. TECHNIQUE: Informed written consent was obtained from the patient after a thorough discussion of the procedural risks, benefits and alternatives. All questions were addressed. Maximal Sterile Barrier Technique was utilized including caps, mask, sterile gowns, sterile gloves, sterile drape, hand hygiene and skin antiseptic. A timeout was performed prior to the initiation of the procedure. The patient was placed prone on the fluoroscopic table. The skin overlying the lumbar region was then prepped and draped in the usual sterile fashion. Maximal barrier sterile technique was utilized including caps, mask, sterile gowns, sterile gloves, sterile drape, hand hygiene and  skin antiseptic. Intravenous Fentanyl and Versed were administered as conscious sedation during continuous cardiorespiratory monitoring by the radiology RN. The left pedicle at L2 was then infiltrated with 1% lidocaine followed by the advancement of a Kyphon trocar needle through the left pedicle into the posterior one-third of the vertebral body. A biopsy specimen was then obtained. Subsequently, the osteo drill was advanced to the anterior third of the vertebral body. The osteo drill was retracted. Through the working cannula, a 10 mm OsteoCool RF ablation probe was inserted and positioned under fluoroscopic guidance. In similar fashion, the right L2 pedicle was infiltrated with 1% lidocaine. Utilizing a extra pedicular approach, a second Kyphon trocar needle was advanced into the posterior third of the vertebral body. A biopsy specimen was then obtained. Subsequently, the osteo drill was coaxially advanced to the anterior right  third. The osteo drill was exchanged for a 10 mm OsteoCool RF ablation probe which was positioned under fluoroscopic guidance. With both OsteoCool ablation probes in place, the ablation was performed for 15 minutes. Attention was now paid towards the kyphoplasty portion of the procedure. Beginning at the L2 vertebral body level, a Kyphon inflatable bone tamp 15 x 2 was advanced through both working cannulas and positioned with the distal marker approximately 5 mm from the anterior aspect of the cortex. Appropriate positioning was confirmed on the AP projection. At this time, the balloon was expanded using contrast via a Kyphon inflation syringe device via micro tubing. Inflations were continued under direct fluoroscopic guidance. At this time, methylmethacrylate mixture was reconstituted in the Kyphon bone mixing device system. This was then loaded into the delivery mechanism, attached to Kyphon bone fillers. The balloons were deflated and removed followed by the instillation of  methylmethacrylate mixture with excellent filling in the AP and lateral projections. The working cannulae and the bone filler were then retrieved and removed. Multiple spot radiographic images were obtained in various obliquities. Hemostasis was achieved with manual compression. The patient tolerated the procedure well without immediate postprocedural complication. FINDINGS: Completion images demonstrate a technically excellent result with adequate cement filling of the L2 vertebral bodies on both the AP and lateral projections. No extravasation was noted posteriorly into the spinal canal. No epidural venous contamination was seen. IMPRESSION: Technically successful L2 vertebral body biopsy, ablation and cement augmentation using balloon kyphoplasty. PLAN: The patient will be seen for clinical follow-up at the interventional radiology clinic in 2-4 weeks. Signed, Criselda Peaches, MD [facility] Electronically Signed   By: Jacqulynn Cadet M.D.   On: 04/28/2019 17:04   Ct Biopsy  Result Date: 05/04/2019 INDICATION: 68 year old with multiple myeloma.  Request for bone marrow biopsy. EXAM: CT GUIDED BONE MARROW ASPIRATES AND BIOPSY Physician: Stephan Minister. Anselm Pancoast, MD MEDICATIONS: None. ANESTHESIA/SEDATION: Fentanyl 100 mcg IV; Versed 4.0 mg IV Moderate Sedation Time:  20 minutes The patient was continuously monitored during the procedure by the interventional radiology nurse under my direct supervision. COMPLICATIONS: None immediate. PROCEDURE: The procedure was explained to the patient. The risks and benefits of the procedure were discussed and the patient's questions were addressed. Informed consent was obtained from the patient. The patient was placed prone on CT table. Images of the pelvis were obtained. The right side of back was prepped and draped in sterile fashion. The skin and right posterior ilium were anesthetized with 1% lidocaine. 11 gauge bone  needle was directed into the right ilium with CT guidance. No aspirate material could be obtained. Therefore, 2 adequate sized core biopsies were obtained. Bandage placed over the puncture site. IMPRESSION: CT-guided bone marrow core biopsy. No aspirate material could be obtained. Electronically Signed   By: Markus Daft M.D.   On: 05/04/2019 13:16   Ct Bone Marrow Biopsy & Aspiration  Result Date: 05/04/2019 INDICATION: 68 year old with multiple myeloma.  Request for bone marrow biopsy. EXAM: CT GUIDED BONE MARROW ASPIRATES AND BIOPSY Physician: Stephan Minister. Anselm Pancoast, MD MEDICATIONS: None. ANESTHESIA/SEDATION: Fentanyl 100 mcg IV; Versed 4.0 mg IV Moderate Sedation Time:  20 minutes The patient was continuously monitored during the procedure by the interventional radiology nurse under my direct supervision. COMPLICATIONS: None immediate. PROCEDURE: The procedure was explained to the patient. The risks and benefits of the procedure were discussed and the patient's questions were addressed. Informed consent was obtained from the patient. The patient was  placed prone on CT table. Images of the pelvis were obtained. The right side of back was prepped and draped in sterile fashion. The skin and right posterior ilium were anesthetized with 1% lidocaine. 11 gauge bone needle was directed into the right ilium with CT guidance. No aspirate material could be obtained. Therefore, 2 adequate sized core biopsies were obtained. Bandage placed over the puncture site. IMPRESSION: CT-guided bone marrow core biopsy. No aspirate material could be obtained. Electronically Signed   By: Markus Daft M.D.   On: 05/04/2019 13:16   Ir Kypho Lumbar Inc Fx Reduce Bone Bx Uni/bil Cannulation Inc/imaging  Result Date: 04/28/2019 CLINICAL DATA:  68 year old female with an acute, painful pathologic fracture at L2. MRI imaging demonstrates a diffuse infiltrative marrow process concerning for multiple myeloma or lymphoma. She presents for transpedicular  L2 biopsy, radiofrequency ablation and cement augmentation with kyphoplasty. EXAM: FLUOROSCOPIC GUIDED L2 VERTEBRAL BODY BIOPSY RF ABLATION AND KYPHOPLASTY/CEMENT AUGMENTATION. COMPARISON:  MRI lumbar spine 04/15/2019 MEDICATIONS: Ancef 2 g IV; The antibiotic was administered in an appropriate time interval prior to needle puncture of the skin. ANESTHESIA/SEDATION: Versed 4 mg IV; Fentanyl 200 mcg IV Moderate Sedation Time: 52 minutes; The patient was continuously monitored during the procedure by the interventional radiology nurse under my direct supervision. FLUOROSCOPY TIME:  Fluoroscopy Time: 8 minutes 30 seconds (661 mGy). COMPLICATIONS: None immediate. TECHNIQUE: Informed written consent was obtained from the patient after a thorough discussion of the procedural risks, benefits and alternatives. All questions were addressed. Maximal Sterile Barrier Technique was utilized including caps, mask, sterile gowns, sterile gloves, sterile drape, hand hygiene and skin antiseptic. A timeout was performed prior to the initiation of the procedure. The patient was placed prone on the fluoroscopic table. The skin overlying the lumbar region was then prepped and draped in the usual sterile fashion. Maximal barrier sterile technique was utilized including caps, mask, sterile gowns, sterile gloves, sterile drape, hand hygiene and skin antiseptic. Intravenous Fentanyl and Versed were administered as conscious sedation during continuous cardiorespiratory monitoring by the radiology RN. The left pedicle at L2 was then infiltrated with 1% lidocaine followed by the advancement of a Kyphon trocar needle through the left pedicle into the posterior one-third of the vertebral body. A biopsy specimen was then obtained. Subsequently, the osteo drill was advanced to the anterior third of the vertebral body. The osteo drill was retracted. Through the working cannula, a 10 mm OsteoCool RF ablation probe was inserted and positioned under  fluoroscopic guidance. In similar fashion, the right L2 pedicle was infiltrated with 1% lidocaine. Utilizing a extra pedicular approach, a second Kyphon trocar needle was advanced into the posterior third of the vertebral body. A biopsy specimen was then obtained. Subsequently, the osteo drill was coaxially advanced to the anterior right third. The osteo drill was exchanged for a 10 mm OsteoCool RF ablation probe which was positioned under fluoroscopic guidance. With both OsteoCool ablation probes in place, the ablation was performed for 15 minutes. Attention was now paid towards the kyphoplasty portion of the procedure. Beginning at the L2 vertebral body level, a Kyphon inflatable bone tamp 15 x 2 was advanced through both working cannulas and positioned with the distal marker approximately 5 mm from the anterior aspect of the cortex. Appropriate positioning was confirmed on the AP projection. At this time, the balloon was expanded using contrast via a Kyphon inflation syringe device via micro tubing. Inflations were continued under direct fluoroscopic guidance. At this time, methylmethacrylate mixture was reconstituted in  the Kyphon bone mixing device system. This was then loaded into the delivery mechanism, attached to Kyphon bone fillers. The balloons were deflated and removed followed by the instillation of methylmethacrylate mixture with excellent filling in the AP and lateral projections. The working cannulae and the bone filler were then retrieved and removed. Multiple spot radiographic images were obtained in various obliquities. Hemostasis was achieved with manual compression. The patient tolerated the procedure well without immediate postprocedural complication. FINDINGS: Completion images demonstrate a technically excellent result with adequate cement filling of the L2 vertebral bodies on both the AP and lateral projections. No extravasation was noted posteriorly into the spinal canal. No epidural venous  contamination was seen. IMPRESSION: Technically successful L2 vertebral body biopsy, ablation and cement augmentation using balloon kyphoplasty. PLAN: The patient will be seen for clinical follow-up at the interventional radiology clinic in 2-4 weeks. Signed, Criselda Peaches, MD [facility] Electronically Signed   By: Jacqulynn Cadet M.D.   On: 04/28/2019 17:04    Orson Eva, DO  [facility] Pager [phone]  If 7PM-7AM, please contact night-coverage [email or web address] Password [email or web address] 05/25/2019, 2:31 PM   LOS: 1 day

## 2019-05-26 DIAGNOSIS — E876 Hypokalemia: Secondary | ICD-10-CM

## 2019-05-26 LAB — GLUCOSE, CAPILLARY
Glucose-Capillary: 111 mg/dL — ABNORMAL HIGH (ref 70–99)
Glucose-Capillary: 170 mg/dL — ABNORMAL HIGH (ref 70–99)
Glucose-Capillary: 128 mg/dL — ABNORMAL HIGH (ref 70–99)
Glucose-Capillary: 119 mg/dL — ABNORMAL HIGH (ref 70–99)
Glucose-Capillary: 161 mg/dL — ABNORMAL HIGH (ref 70–99)

## 2019-05-26 LAB — CBC WITH DIFFERENTIAL/PLATELET
Abs Immature Granulocytes: 0.01 10*3/uL (ref 0.00–0.07)
Basophils Absolute: 0 10*3/uL (ref 0.0–0.1)
Basophils Relative: 1 %
Eosinophils Absolute: 0.1 10*3/uL (ref 0.0–0.5)
Eosinophils Relative: 8 %
HCT: 21.9 % — ABNORMAL LOW (ref 36.0–46.0)
Hemoglobin: 7.1 g/dL — ABNORMAL LOW (ref 12.0–15.0)
Immature Granulocytes: 1 %
Lymphocytes Relative: 24 %
Lymphs Abs: 0.2 10*3/uL — ABNORMAL LOW (ref 0.7–4.0)
MCH: 32.3 pg (ref 26.0–34.0)
MCHC: 32.4 g/dL (ref 30.0–36.0)
MCV: 99.5 fL (ref 80.0–100.0)
Monocytes Absolute: 0.1 10*3/uL (ref 0.1–1.0)
Monocytes Relative: 13 %
Neutro Abs: 0.4 10*3/uL — ABNORMAL LOW (ref 1.7–7.7)
Neutrophils Relative %: 53 %
Platelets: 39 10*3/uL — ABNORMAL LOW (ref 150–400)
RBC: 2.2 MIL/uL — ABNORMAL LOW (ref 3.87–5.11)
RDW: 15.3 % (ref 11.5–15.5)
WBC Morphology: INCREASED
WBC: 0.7 10*3/uL — CL (ref 4.0–10.5)
nRBC: 0 % (ref 0.0–0.2)

## 2019-05-26 LAB — COMPREHENSIVE METABOLIC PANEL
ALT: 37 U/L (ref 0–44)
AST: 19 U/L (ref 15–41)
Albumin: 2.4 g/dL — ABNORMAL LOW (ref 3.5–5.0)
Alkaline Phosphatase: 101 U/L (ref 38–126)
Anion gap: 7 (ref 5–15)
BUN: 24 mg/dL — ABNORMAL HIGH (ref 8–23)
CO2: 20 mmol/L — ABNORMAL LOW (ref 22–32)
Calcium: 7.5 mg/dL — ABNORMAL LOW (ref 8.9–10.3)
Chloride: 109 mmol/L (ref 98–111)
Creatinine, Ser: 3.86 mg/dL — ABNORMAL HIGH (ref 0.44–1.00)
GFR calc Af Amer: 13 mL/min — ABNORMAL LOW (ref >60)
GFR calc non Af Amer: 11 mL/min — ABNORMAL LOW (ref >60)
Glucose, Bld: 123 mg/dL — ABNORMAL HIGH (ref 70–99)
Potassium: 4 mmol/L (ref 3.5–5.1)
Sodium: 136 mmol/L (ref 135–145)
Total Bilirubin: 0.7 mg/dL (ref 0.3–1.2)
Total Protein: 5.9 g/dL — ABNORMAL LOW (ref 6.5–8.1)

## 2019-05-26 LAB — URINALYSIS, ROUTINE W REFLEX MICROSCOPIC
Bilirubin Urine: NEGATIVE
Glucose, UA: NEGATIVE mg/dL
Hgb urine dipstick: NEGATIVE
Ketones, ur: NEGATIVE mg/dL
Leukocytes,Ua: NEGATIVE
Nitrite: NEGATIVE
Protein, ur: NEGATIVE mg/dL
Specific Gravity, Urine: 1.008 (ref 1.005–1.030)
pH: 6 (ref 5.0–8.0)

## 2019-05-26 LAB — NA AND K (SODIUM & POTASSIUM), RAND UR
Potassium Urine: 20 mmol/L
Sodium, Ur: 65 mmol/L

## 2019-05-26 LAB — HEMOGLOBIN A1C
Hgb A1c MFr Bld: 8.4 % — ABNORMAL HIGH (ref 4.8–5.6)
Mean Plasma Glucose: 194.38 mg/dL

## 2019-05-26 LAB — CK: Total CK: 137 U/L (ref 38–234)

## 2019-05-26 MED ORDER — FILGRASTIM 480 MCG/1.6ML IJ SOLN
480.0000 ug | Freq: Once | INTRAMUSCULAR | Status: AC
  Administered 2019-05-26: 480 ug via SUBCUTANEOUS
  Filled 2019-05-26: qty 1.6

## 2019-05-26 MED ORDER — SODIUM CHLORIDE 0.9 % IV SOLN
200.0000 mg | Freq: Two times a day (BID) | INTRAVENOUS | Status: DC
  Administered 2019-05-26: 200 mg via INTRAVENOUS
  Filled 2019-05-26 (×3): qty 200

## 2019-05-26 MED ORDER — TBO-FILGRASTIM 480 MCG/0.8ML ~~LOC~~ SOSY
480.0000 ug | PREFILLED_SYRINGE | Freq: Every day | SUBCUTANEOUS | Status: DC
  Filled 2019-05-26 (×4): qty 0.8

## 2019-05-26 MED ORDER — TBO-FILGRASTIM 480 MCG/0.8ML ~~LOC~~ SOSY
480.0000 ug | PREFILLED_SYRINGE | Freq: Once | SUBCUTANEOUS | Status: DC
  Filled 2019-05-26: qty 0.8

## 2019-05-26 MED ORDER — CALCIUM CARBONATE ANTACID 500 MG PO CHEW
2.0000 | CHEWABLE_TABLET | Freq: Once | ORAL | Status: AC
  Administered 2019-05-26: 400 mg via ORAL

## 2019-05-26 NOTE — Progress Notes (Signed)
PROGRESS NOTE  Vicki Moon:[id number] DOB: 1951/10/31 DOA: 05/24/2019 PCP: [name], MD  Brief History:  68 year old female with a history of diabetes mellitus, myeloma, and MRSA skin infections presenting with cellulitis and increasing number of furuncles.  The patient went to see her oncologist, Dr. [name] on 05/24/2019.  She was scheduled to receive her dose of Velcade.  However, she was noted to have increasing number of furuncles on her skin.  As result, her Velcade was held.  It was also held in part due to the patient's pancytopenia.  Because of increasing number of furuncles, the patient was recommended to go to the emergency department for further evaluation and intravenous antibiotics.  Upon presentation, the patient had temperature 100.0 F but was hemodynamically stable.  WBC was the 1.1.  The patient was started on vancomycin IV.  Assessment/Plan: Cellulitis right groin/furunculosis -discontinue vancomycin IV due to renal failure -Continue mupirocin nasal therapy for decolonization -Chlorhexidine baths  AKI -likely due to hemodynamic changes in the setting of lisinopril -baseline creatinine 0.9-1.1 -serum creatinine continues to worsen -d/c lisinopril -renal us--neg hydronephrosis -consulted renal -continue IVF  IgG kappa myeloma -Patient followed by Dr. [name] -Started on Revlimid 05/09/2019 -05/12/2019. PET scan showed patchy areas of hypermetabolism throughout the osseous skeleton -05/04/19 BM biopsy--showed marked increase in plasma cells. I am no histochemistry stain, she had 90% plasma cells. -received Xgeva 06/19/19  Pancytopenia -likely due to Revlimid--stopped 6/2 by Dr. [name] -start Granix  Diabetes mellitus type 2, uncontrolled with hyperglycemia -12/18/2018--hemoglobin A1c 8.1 -Continue Lantus  -Continue NovoLog sliding scale -repeat A1C  Hyperlipidemia -Continue statin  Essential hypertension -Holding lisinopril  secondary to acute kidney injury and soft BPs      Disposition Plan:   Home in 2-3 days  Family Communication:  No Family at bedside  Consultants:  none  Code Status:  FULL  DVT Prophylaxis:  SCDS   Procedures: As Listed in Progress Note Above  Antibiotics: vanco 6/2>>>      Subjective: Patient feels that her groin furuncles are improving.  She denies any fevers, chills, chest pain, shortness breath, cough, hemoptysis, nausea, vomiting, diarrhea, domino pain.  She has a headache.  There is no visual disturbance.  Objective: Vitals:   05/25/19 2010 05/25/19 2212 05/26/19 0544 05/26/19 1549  BP:  (!) 100/53 (!) 99/47 (!) 121/49  Pulse:  68 69 68  Resp:  _0 Temp:  99.2 F (37.3 C) 98.3 F (36.8 C) 98.3 F (36.8 C)  TempSrc:  Oral Oral Oral  SpO2: 93% 95% 96% 100%  Weight:      Height:        Intake/Output Summary (Last 24 hours) at 05/26/2019 1834 Last data filed at 05/26/2019 0631 Gross per 24 hour  Intake 1212.83 ml  Output --  Net 1212.83 ml   Weight change:  Exam:   General:  Pt is alert, follows commands appropriately, not in acute distress  HEENT: No icterus, No thrush, No neck mass, [place]/AT  Cardiovascular: RRR, S1/S2, no rubs, no gallops  Respiratory: CTA bilaterally, no wheezing, no crackles, no rhonchi  Abdomen: Soft/+BS, non tender, non distended, no guarding  Extremities: No edema, No lymphangitis, No petechiae, No rashes, no synovitis; right groin furuncle without any drainage, necrosis, crepitance.   Data Reviewed: I have personally reviewed following labs and imaging studies Basic Metabolic Panel: Recent Labs  Lab 05/24/19 0856 05/24/19 1519 05/25/19 0443 05/26/19 0454  NA 138  --  136 136  K 3.2*  --  4.1 4.0  CL 103  --  106 109  CO2 27  --  22 20*  GLUCOSE 276*  --  162* 123*  BUN 12  --  16 24*  CREATININE 1.17*  --  2.17* 3.86*  CALCIUM 7.8*  --  7.7* 7.5*  MG  --  1.7 1.7  --    Liver Function  Tests: Recent Labs  Lab 05/24/19 0856 05/25/19 0443 05/26/19 0454  AST 14* 25 19  ALT 23 36 37  ALKPHOS 100 92 101  BILITOT 0.6 0.7 0.7  PROT 6.9 5.9* 5.9*  ALBUMIN 3.4* 2.5* 2.4*   No results for input(s): LIPASE, AMYLASE in the last 168 hours. No results for input(s): AMMONIA in the last 168 hours. Coagulation Profile: No results for input(s): INR, PROTIME in the last 168 hours. CBC: Recent Labs  Lab 05/24/19 0856 05/25/19 0443 05/26/19 0454  WBC 1.1* 1.0* 0.7*  NEUTROABS 0.6* 0.5* 0.4*  HGB 8.0* 7.5* 7.1*  HCT 23.9* 23.6* 21.9*  MCV 96.8 99.6 99.5  PLT 43* 38* 39*   Cardiac Enzymes: Recent Labs  Lab 05/26/19 0454  CKTOTAL 137   BNP: Invalid input(s): POCBNP CBG: Recent Labs  Lab 05/25/19 2212 05/26/19 0541 05/26/19 0727 05/26/19 1119 05/26/19 1637  GLUCAP 122* 111* 170* 128* 119*   HbA1C: Recent Labs    05/24/19 1519 05/25/19 0443  HGBA1C 8.5* 8.4*   Urine analysis:    Component Value Date/Time   COLORURINE YELLOW 07/11/2014 1440   APPEARANCEUR HAZY (A) 07/11/2014 1440   LABSPEC 1.017 07/11/2014 1440   PHURINE 5.5 07/11/2014 1440   GLUCOSEU NEGATIVE 07/11/2014 1440   HGBUR NEGATIVE 07/11/2014 1440   BILIRUBINUR NEGATIVE 07/11/2014 1440   KETONESUR NEGATIVE 07/11/2014 1440   PROTEINUR NEGATIVE 07/11/2014 1440   UROBILINOGEN 0.2 07/11/2014 1440   NITRITE NEGATIVE 07/11/2014 1440   LEUKOCYTESUR NEGATIVE 07/11/2014 1440   Sepsis Labs: _0 (procalcitonin:4,lacticidven:4) ) Recent Results (from the past 240 hour(s))  SARS Coronavirus 2 (CEPHEID - Performed in [facility] hospital lab), Hosp Order     Status: None   Collection Time: 05/24/19  2:19 PM  Result Value Ref Range Status   SARS Coronavirus 2 NEGATIVE NEGATIVE Final    Comment: (NOTE) If result is NEGATIVE SARS-CoV-2 target nucleic acids are NOT DETECTED. The SARS-CoV-2 RNA is generally detectable in upper and lower  respiratory specimens during the acute phase of  infection. The lowest  concentration of SARS-CoV-2 viral copies this assay can detect is 250  copies / mL. A negative result does not preclude SARS-CoV-2 infection  and should not be used as the sole basis for treatment or other  patient management decisions.  A negative result may occur with  improper specimen collection / handling, submission of specimen other  than nasopharyngeal swab, presence of viral mutation(s) within the  areas targeted by this assay, and inadequate number of viral copies  (<250 copies / mL). A negative result must be combined with clinical  observations, patient history, and epidemiological information. If result is POSITIVE SARS-CoV-2 target nucleic acids are DETECTED. The SARS-CoV-2 RNA is generally detectable in upper and lower  respiratory specimens dur ing the acute phase of infection.  Positive  results are indicative of active infection with SARS-CoV-2.  Clinical  correlation with patient history and other diagnostic information is  necessary to determine patient infection status.  Positive results do  not rule out bacterial infection  or co-infection with other viruses. If result is PRESUMPTIVE POSTIVE SARS-CoV-2 nucleic acids MAY BE PRESENT.   A presumptive positive result was obtained on the submitted specimen  and confirmed on repeat testing.  While 2019 novel coronavirus  (SARS-CoV-2) nucleic acids may be present in the submitted sample  additional confirmatory testing may be necessary for epidemiological  and / or clinical management purposes  to differentiate between  SARS-CoV-2 and other Sarbecovirus currently known to infect humans.  If clinically indicated additional testing with an alternate test  methodology [phone]) is advised. The SARS-CoV-2 RNA is generally  detectable in upper and lower respiratory sp ecimens during the acute  phase of infection. The expected result is Negative. Fact Sheet for Patients:   StrictlyIdeas.no Fact Sheet for Healthcare Providers: BankingDealers.co.za This test is not yet approved or cleared by the [place] FDA and has been authorized for detection and/or diagnosis of SARS-CoV-2 by FDA under an Emergency Use Authorization (EUA).  This EUA will remain in effect (meaning this test can be used) for the duration of the COVID-19 declaration under Section 564(b)(1) of the Act, 21 U.S.C. section 360bbb-3(b)(1), unless the authorization is terminated or revoked sooner. Performed at [facility], [address], [place], [place] [address]   Culture, blood (Routine X 2) w Reflex to ID Panel     Status: None (Preliminary result)   Collection Time: 05/24/19  3:19 PM  Result Value Ref Range Status   Specimen Description BLOOD RIGHT ANTECUBITAL  Final   Special Requests   Final    BOTTLES DRAWN AEROBIC AND ANAEROBIC Blood Culture adequate volume   Culture   Final    NO GROWTH 2 DAYS Performed at [facility], [address] [place], [place] [address]    Report Status PENDING  Incomplete  Culture, blood (Routine X 2) w Reflex to ID Panel     Status: None (Preliminary result)   Collection Time: 05/24/19  3:19 PM  Result Value Ref Range Status   Specimen Description BLOOD BLOOD RIGHT WRIST  Final   Special Requests   Final    BOTTLES DRAWN AEROBIC AND ANAEROBIC Blood Culture adequate volume   Culture   Final    NO GROWTH 2 DAYS Performed at [facility], [address] [place], [place] [address]    Report Status PENDING  Incomplete     Scheduled Meds:  aspirin  325 mg Oral Daily   atorvastatin  10 mg Oral q1800   famciclovir  250 mg Oral Daily   insulin aspart  0-15 Units Subcutaneous TID WC   insulin aspart  0-5 Units Subcutaneous QHS   insulin glargine  25 Units Subcutaneous QHS   latanoprost  1 drop Both Eyes QHS   mupirocin cream   Topical BID   mupirocin ointment   Nasal BID   PARoxetine  30 mg  Oral q morning - 10a   [START ON 05/27/2019] Tbo-filgastrim (GRANIX) SQ  480 mcg Subcutaneous Daily   Continuous Infusions:  0.9 % NaCl with KCl 20 mEq / L 100 mL/hr at 05/26/19 1057    Procedures/Studies: US Renal  Result Date: 05/25/2019 CLINICAL DATA:  Acute kidney injury. EXAM: RENAL / URINARY TRACT ULTRASOUND COMPLETE COMPARISON:  Ultrasound of December 18, 2018. FINDINGS: Right Kidney: Renal measurements: 15.1 x 7.7 x 6.3 cm = volume: 380 mL . Echogenicity within normal limits. No mass or hydronephrosis visualized. Left Kidney: Renal measurements: 12.6 x 6.5 x 6.4 cm = volume: 270 mL. Echogenicity within normal  limits. No mass or hydronephrosis visualized. Bladder: Appears normal for degree of bladder distention. Ureteral jets are not visualized. IMPRESSION: No significant renal abnormality is noted. Electronically Signed   By: Marijo Conception M.D.   On: 05/25/2019 16:04   Nm Pet Image Initial (pi) Whole Body  Result Date: 05/12/2019 CLINICAL DATA:  Initial treatment strategy for multiple myeloma. EXAM: NUCLEAR MEDICINE PET WHOLE BODY TECHNIQUE: 10.0 mCi F-18 FDG was injected intravenously. Full-ring PET imaging was performed from the skull base to thigh after the radiotracer. CT data was obtained and used for attenuation correction and anatomic localization. Fasting blood glucose: 198 mg/dl COMPARISON:  CT chest abdomen pelvis 04/25/2019. FINDINGS: Mediastinal blood pool activity: SUV max 3.9 HEAD/NECK: Mildly asymmetric uptake within the left nasopharynx, without a CT correlate. Otherwise, abnormal hypermetabolism in the head/neck. Incidental CT findings: None. CHEST: No hypermetabolic mediastinal, hilar or axillary lymph nodes. No hypermetabolic pulmonary nodules. Incidental CT findings: Atherosclerotic calcification of the aorta and coronary arteries. Heart is enlarged. No pericardial or pleural effusion. There may be a few scattered millimetric pulmonary nodules which are distorted due to  respiratory motion and expiratory phase imaging. ABDOMEN/PELVIS: No abnormal hypermetabolism in the liver, adrenal glands, spleen or pancreas. No hypermetabolic pathologically enlarged lymph nodes. Incidental CT findings: Liver, adrenal glands and right kidney are unremarkable. Tiny stone in the lower pole left kidney. Spleen, pancreas, stomach and bowel are otherwise unremarkable. Cholecystectomy. Uterus is visualized. There may be bladder wall thickening but the bladder is poorly distended. SKELETON: Focal uptake in anterior right ribs are associated with probable old fractures. Mild hypermetabolism associated with an L2 vertebral body augmentation. Incidental CT findings: L2 vertebral body augmentation. A 12 mm lytic lesion in the left aspect of S1 (series 4, image 181) is again seen and without definitive hypermetabolism. Subtle lysis within the inferior endplate of L5 is better seen on the comparison exam. EXTREMITIES: No abnormal hypermetabolism. Incidental CT findings: There is focal atrophy of the right calf musculature. IMPRESSION: 1. Patchy hypermetabolism throughout the visualized osseous structures. L5 and S1 lucencies do not appear focally hypermetabolic. 2. Left renal stone. 3. Aortic atherosclerosis (ICD10-170.0). Coronary artery calcification. Electronically Signed   By: Lorin Picket M.D.   On: 05/12/2019 09:10   Ir Bone Tumor(s)rf Ablation  Result Date: 04/28/2019 CLINICAL DATA:  68 year old female with an acute, painful pathologic fracture at L2. MRI imaging demonstrates a diffuse infiltrative marrow process concerning for multiple myeloma or lymphoma. She presents for transpedicular L2 biopsy, radiofrequency ablation and cement augmentation with kyphoplasty. EXAM: FLUOROSCOPIC GUIDED L2 VERTEBRAL BODY BIOPSY RF ABLATION AND KYPHOPLASTY/CEMENT AUGMENTATION. COMPARISON:  MRI lumbar spine 04/15/2019 MEDICATIONS: Ancef 2 g IV; The antibiotic was administered in an appropriate time interval prior  to needle puncture of the skin. ANESTHESIA/SEDATION: Versed 4 mg IV; Fentanyl 200 mcg IV Moderate Sedation Time: 52 minutes; The patient was continuously monitored during the procedure by the interventional radiology nurse under my direct supervision. FLUOROSCOPY TIME:  Fluoroscopy Time: 8 minutes 30 seconds (661 mGy). COMPLICATIONS: None immediate. TECHNIQUE: Informed written consent was obtained from the patient after a thorough discussion of the procedural risks, benefits and alternatives. All questions were addressed. Maximal Sterile Barrier Technique was utilized including caps, mask, sterile gowns, sterile gloves, sterile drape, hand hygiene and skin antiseptic. A timeout was performed prior to the initiation of the procedure. The patient was placed prone on the fluoroscopic table. The skin overlying the lumbar region was then prepped and draped in the usual sterile fashion. Maximal  barrier sterile technique was utilized including caps, mask, sterile gowns, sterile gloves, sterile drape, hand hygiene and skin antiseptic. Intravenous Fentanyl and Versed were administered as conscious sedation during continuous cardiorespiratory monitoring by the radiology RN. The left pedicle at L2 was then infiltrated with 1% lidocaine followed by the advancement of a Kyphon trocar needle through the left pedicle into the posterior one-third of the vertebral body. A biopsy specimen was then obtained. Subsequently, the osteo drill was advanced to the anterior third of the vertebral body. The osteo drill was retracted. Through the working cannula, a 10 mm OsteoCool RF ablation probe was inserted and positioned under fluoroscopic guidance. In similar fashion, the right L2 pedicle was infiltrated with 1% lidocaine. Utilizing a extra pedicular approach, a second Kyphon trocar needle was advanced into the posterior third of the vertebral body. A biopsy specimen was then obtained. Subsequently, the osteo drill was coaxially advanced  to the anterior right third. The osteo drill was exchanged for a 10 mm OsteoCool RF ablation probe which was positioned under fluoroscopic guidance. With both OsteoCool ablation probes in place, the ablation was performed for 15 minutes. Attention was now paid towards the kyphoplasty portion of the procedure. Beginning at the L2 vertebral body level, a Kyphon inflatable bone tamp 15 x 2 was advanced through both working cannulas and positioned with the distal marker approximately 5 mm from the anterior aspect of the cortex. Appropriate positioning was confirmed on the AP projection. At this time, the balloon was expanded using contrast via a Kyphon inflation syringe device via micro tubing. Inflations were continued under direct fluoroscopic guidance. At this time, methylmethacrylate mixture was reconstituted in the Kyphon bone mixing device system. This was then loaded into the delivery mechanism, attached to Kyphon bone fillers. The balloons were deflated and removed followed by the instillation of methylmethacrylate mixture with excellent filling in the AP and lateral projections. The working cannulae and the bone filler were then retrieved and removed. Multiple spot radiographic images were obtained in various obliquities. Hemostasis was achieved with manual compression. The patient tolerated the procedure well without immediate postprocedural complication. FINDINGS: Completion images demonstrate a technically excellent result with adequate cement filling of the L2 vertebral bodies on both the AP and lateral projections. No extravasation was noted posteriorly into the spinal canal. No epidural venous contamination was seen. IMPRESSION: Technically successful L2 vertebral body biopsy, ablation and cement augmentation using balloon kyphoplasty. PLAN: The patient will be seen for clinical follow-up at the interventional radiology clinic in 2-4 weeks. Signed, Criselda Peaches, MD [facility] Radiology Electronically Signed   By: Jacqulynn Cadet M.D.   On: 04/28/2019 17:04   Ct Biopsy  Result Date: 05/04/2019 INDICATION: 68 year old with multiple myeloma.  Request for bone marrow biopsy. EXAM: CT GUIDED BONE MARROW ASPIRATES AND BIOPSY Physician: Stephan Minister. Anselm Pancoast, MD MEDICATIONS: None. ANESTHESIA/SEDATION: Fentanyl 100 mcg IV; Versed 4.0 mg IV Moderate Sedation Time:  20 minutes The patient was continuously monitored during the procedure by the interventional radiology nurse under my direct supervision. COMPLICATIONS: None immediate. PROCEDURE: The procedure was explained to the patient. The risks and benefits of the procedure were discussed and the patient's questions were addressed. Informed consent was obtained from the patient. The patient was placed prone on CT table. Images of the pelvis were obtained. The right side of back was prepped and draped in sterile fashion. The skin and right posterior ilium were anesthetized with 1% lidocaine. 11 gauge bone needle was  directed into the right ilium with CT guidance. No aspirate material could be obtained. Therefore, 2 adequate sized core biopsies were obtained. Bandage placed over the puncture site. IMPRESSION: CT-guided bone marrow core biopsy. No aspirate material could be obtained. Electronically Signed   By: Markus Daft M.D.   On: 05/04/2019 13:16   Ct Bone Marrow Biopsy & Aspiration  Result Date: 05/04/2019 INDICATION: 68 year old with multiple myeloma.  Request for bone marrow biopsy. EXAM: CT GUIDED BONE MARROW ASPIRATES AND BIOPSY Physician: Stephan Minister. Anselm Pancoast, MD MEDICATIONS: None. ANESTHESIA/SEDATION: Fentanyl 100 mcg IV; Versed 4.0 mg IV Moderate Sedation Time:  20 minutes The patient was continuously monitored during the procedure by the interventional radiology nurse under my direct supervision. COMPLICATIONS: None immediate. PROCEDURE: The procedure was explained to the patient. The risks and benefits of the  procedure were discussed and the patient's questions were addressed. Informed consent was obtained from the patient. The patient was placed prone on CT table. Images of the pelvis were obtained. The right side of back was prepped and draped in sterile fashion. The skin and right posterior ilium were anesthetized with 1% lidocaine. 11 gauge bone needle was directed into the right ilium with CT guidance. No aspirate material could be obtained. Therefore, 2 adequate sized core biopsies were obtained. Bandage placed over the puncture site. IMPRESSION: CT-guided bone marrow core biopsy. No aspirate material could be obtained. Electronically Signed   By: Markus Daft M.D.   On: 05/04/2019 13:16   Ir Kypho Lumbar Inc Fx Reduce Bone Bx Uni/bil Cannulation Inc/imaging  Result Date: 04/28/2019 CLINICAL DATA:  68 year old female with an acute, painful pathologic fracture at L2. MRI imaging demonstrates a diffuse infiltrative marrow process concerning for multiple myeloma or lymphoma. She presents for transpedicular L2 biopsy, radiofrequency ablation and cement augmentation with kyphoplasty. EXAM: FLUOROSCOPIC GUIDED L2 VERTEBRAL BODY BIOPSY RF ABLATION AND KYPHOPLASTY/CEMENT AUGMENTATION. COMPARISON:  MRI lumbar spine 04/15/2019 MEDICATIONS: Ancef 2 g IV; The antibiotic was administered in an appropriate time interval prior to needle puncture of the skin. ANESTHESIA/SEDATION: Versed 4 mg IV; Fentanyl 200 mcg IV Moderate Sedation Time: 52 minutes; The patient was continuously monitored during the procedure by the interventional radiology nurse under my direct supervision. FLUOROSCOPY TIME:  Fluoroscopy Time: 8 minutes 30 seconds (661 mGy). COMPLICATIONS: None immediate. TECHNIQUE: Informed written consent was obtained from the patient after a thorough discussion of the procedural risks, benefits and alternatives. All questions were addressed. Maximal Sterile Barrier Technique was utilized including caps, mask, sterile gowns,  sterile gloves, sterile drape, hand hygiene and skin antiseptic. A timeout was performed prior to the initiation of the procedure. The patient was placed prone on the fluoroscopic table. The skin overlying the lumbar region was then prepped and draped in the usual sterile fashion. Maximal barrier sterile technique was utilized including caps, mask, sterile gowns, sterile gloves, sterile drape, hand hygiene and skin antiseptic. Intravenous Fentanyl and Versed were administered as conscious sedation during continuous cardiorespiratory monitoring by the radiology RN. The left pedicle at L2 was then infiltrated with 1% lidocaine followed by the advancement of a Kyphon trocar needle through the left pedicle into the posterior one-third of the vertebral body. A biopsy specimen was then obtained. Subsequently, the osteo drill was advanced to the anterior third of the vertebral body. The osteo drill was retracted. Through the working cannula, a 10 mm OsteoCool RF ablation probe was inserted and positioned under fluoroscopic guidance. In similar fashion, the right L2 pedicle was infiltrated with 1% lidocaine.  Utilizing a extra pedicular approach, a second Kyphon trocar needle was advanced into the posterior third of the vertebral body. A biopsy specimen was then obtained. Subsequently, the osteo drill was coaxially advanced to the anterior right third. The osteo drill was exchanged for a 10 mm OsteoCool RF ablation probe which was positioned under fluoroscopic guidance. With both OsteoCool ablation probes in place, the ablation was performed for 15 minutes. Attention was now paid towards the kyphoplasty portion of the procedure. Beginning at the L2 vertebral body level, a Kyphon inflatable bone tamp 15 x 2 was advanced through both working cannulas and positioned with the distal marker approximately 5 mm from the anterior aspect of the cortex. Appropriate positioning was confirmed on the AP projection. At this time, the  balloon was expanded using contrast via a Kyphon inflation syringe device via micro tubing. Inflations were continued under direct fluoroscopic guidance. At this time, methylmethacrylate mixture was reconstituted in the Kyphon bone mixing device system. This was then loaded into the delivery mechanism, attached to Kyphon bone fillers. The balloons were deflated and removed followed by the instillation of methylmethacrylate mixture with excellent filling in the AP and lateral projections. The working cannulae and the bone filler were then retrieved and removed. Multiple spot radiographic images were obtained in various obliquities. Hemostasis was achieved with manual compression. The patient tolerated the procedure well without immediate postprocedural complication. FINDINGS: Completion images demonstrate a technically excellent result with adequate cement filling of the L2 vertebral bodies on both the AP and lateral projections. No extravasation was noted posteriorly into the spinal canal. No epidural venous contamination was seen. IMPRESSION: Technically successful L2 vertebral body biopsy, ablation and cement augmentation using balloon kyphoplasty. PLAN: The patient will be seen for clinical follow-up at the interventional radiology clinic in 2-4 weeks. Signed, Criselda Peaches, MD [facility] Radiology Electronically Signed   By: Jacqulynn Cadet M.D.   On: 04/28/2019 17:04    Orson Eva, DO  [facility] Pager [phone]  If 7PM-7AM, please contact night-coverage [email or web address] Password [email or web address] 05/26/2019, 6:34 PM   LOS: 2 days

## 2019-05-26 NOTE — Consult Note (Signed)
[facility] Nephrology Consultation Note  Requesting MD: Dr. Orson Eva Reason for consult: AKI   HPI:  Vicki Moon is a 68 y.o. female with history of diabetes with A1c 5, recently diagnosed with multiple myeloma follows with Dr. [name] on Velcade, recurrent skin infection with MRSA, sent to the hospital for pancytopenia.  The Revlimid was stopped because of pancytopenia.  Patient has history of AKI in the past with peak creatinine around 2 however during this admission the serum creatinine level was 1.17.  The creatinine level continued to worsen to 2.17 and 3.86 today.  No urine output measures however patient reports good urine output.  The ultrasound of kidneys with no obstruction.  Patient was found to be hypotensive to 80s and was on lisinopril.  No use of NSAIDs or IV contrast. We are consulted for further evaluation of AKI.  Patient is currently on ceftaroline IV for MRSA infection.  She denied dysuria, urgency, frequency, pelvic pain, chest pain or shortness of breath.  Receiving IV fluid.  Blood pressure is a still low 99/47 today. The kappa lambda ratio was 90.32 before chemotherapy and it improved to 20.04 on 05/17/2019.  Creatinine  Date/Time Value Ref Range Status  05/24/2019 08:56 AM 1.17 (H) 0.44 - 1.00 mg/dL Final  05/17/2019 01:19 PM 1.40 (H) 0.44 - 1.00 mg/dL Final  04/21/2019 10:49 AM 0.75 0.44 - 1.00 mg/dL Final   Creat  Date/Time Value Ref Range Status  01/12/2018 10:04 AM 0.65 0.50 - 0.99 mg/dL Final    Comment:    For patients >32 years of age, the reference limit for Creatinine is approximately 13% higher for people identified as African-American. .   10/07/2017 09:46 AM 0.54 0.50 - 0.99 mg/dL Final    Comment:    For patients >104 years of age, the reference limit for Creatinine is approximately 13% higher for people identified as African-American. .   07/07/2017 08:32 AM 0.45 (L) 0.50 - 0.99 mg/dL Final    Comment:      For patients  > or = 70 years of age: The upper reference limit for Creatinine is approximately 13% higher for people identified as African-American.     04/06/2017 09:00 AM 0.45 (L) 0.50 - 0.99 mg/dL Final    Comment:      For patients > or = 70 years of age: The upper reference limit for Creatinine is approximately 13% higher for people identified as African-American.     12/24/2016 09:47 AM 0.58 0.50 - 0.99 mg/dL Final    Comment:      For patients > or = 70 years of age: The upper reference limit for Creatinine is approximately 13% higher for people identified as African-American.     09/15/2016 09:45 AM 0.55 0.50 - 0.99 mg/dL Final    Comment:      For patients > or = 70 years of age: The upper reference limit for Creatinine is approximately 13% higher for people identified as African-American.     06/16/2016 09:53 AM 0.48 (L) 0.50 - 0.99 mg/dL Final    Comment:      For patients > or = 70 years of age: The upper reference limit for Creatinine is approximately 13% higher for people identified as African-American.     03/13/2016 09:36 AM 0.63 0.50 - 0.99 mg/dL Final  05/29/2015 11:03 AM 0.52 0.50 - 1.10 mg/dL Final   Creatinine, Ser  Date/Time Value Ref Range Status  05/26/2019 04:54 AM 3.86 (H) 0.44 -  1.00 mg/dL Final    Comment:    DELTA CHECK NOTED  05/25/2019 04:43 AM 2.17 (H) 0.44 - 1.00 mg/dL Final  04/15/2019 10:02 AM 0.90 0.44 - 1.00 mg/dL Final  12/23/2018 04:31 AM 1.87 (H) 0.44 - 1.00 mg/dL Final  12/22/2018 06:20 AM 1.94 (H) 0.44 - 1.00 mg/dL Final  12/21/2018 04:51 AM 1.95 (H) 0.44 - 1.00 mg/dL Final  12/20/2018 04:38 AM 2.03 (H) 0.44 - 1.00 mg/dL Final  12/19/2018 05:06 AM 2.09 (H) 0.44 - 1.00 mg/dL Final  12/18/2018 05:43 AM 2.11 (H) 0.44 - 1.00 mg/dL Final  12/17/2018 04:36 PM 2.26 (H) 0.44 - 1.00 mg/dL Final  09/12/2014 08:48 AM 0.60 0.50 - 1.10 mg/dL Final  07/11/2014 02:41 PM 0.59 0.50 - 1.10 mg/dL Final     PMHx:   Past Medical History:   Diagnosis Date  . Diabetes mellitus without complication (HCC)    Type 2 IDDM x 5 years  . Glaucoma   . Goals of care, counseling/discussion 04/29/2019  . Herniated lumbar intervertebral disc 06/2014  . Multiple myeloma ([place]) 04/29/2019  . PONV (postoperative nausea and vomiting)     Past Surgical History:  Procedure Laterality Date  . CHOLECYSTECTOMY    . COLONOSCOPY N/A 06/28/2015   Procedure: COLONOSCOPY;  Surgeon: Rogene Houston, MD;  [facility];  Service: Endoscopy;  Laterality: N/A;  155  . EYE SURGERY    . IR BONE TUMOR(S)RF ABLATION  04/28/2019  . IR KYPHO LUMBAR INC FX REDUCE BONE BX UNI/BIL CANNULATION INC/IMAGING  04/28/2019  . IR RADIOLOGIST EVAL & MGMT  04/22/2019  . LUMBAR LAMINECTOMY Right 07/12/2014   Procedure: LUMBAR FIVE TO SACRAL ONE MICRODISCECTOMY;  Surgeon: Marybelle Killings, MD;  Location: [place];  Service: Orthopedics;  Laterality: Right;  . TUBAL LIGATION    . VITRECTOMY 25 GAUGE WITH SCLERAL BUCKLE Left 07/29/2017   Procedure: RETINAL DETACHMENT REPAIR LEFT EYE WITH PARSPLANA VITRECTOMY, AIR FLUID EXCHANGE, ENDO LASTER, DRAINAGE OF SUBRETINAL FLUID,ENDOLASER PPV /25 GAUGE;  Surgeon: Jalene Mullet, MD;  Location: [place];  Service: Ophthalmology;  Laterality: Left;    Family Hx:  Family History  Problem Relation Age of Onset  . Hypertension Mother   . Stroke Mother   . Cancer Father        brain tumor  . Diabetes Father     Social History:  reports that she has been smoking cigarettes. She has a 10.00 pack-year smoking history. She has never used smokeless tobacco. She reports that she does not drink alcohol or use drugs.  Allergies:  Allergies  Allergen Reactions  . Metformin Nausea Only    "severe GI"    Medications: Prior to Admission medications   Medication Sig Start Date End Date Taking? Authorizing Provider  ALPRAZolam Duanne Moron) 0.5 MG tablet Take 0.5 mg by mouth daily. 05/10/19  Yes [provider]  aspirin 325 MG tablet Take 325 mg  by mouth daily.   Yes [provider]  atorvastatin (LIPITOR) 10 MG tablet Take 10 mg by mouth daily. 08/17/18  Yes [provider]  B-D ULTRAFINE III SHORT PEN 31G X 8 MM MISC USE ONE PEN NEEDLE 4 TIMES DAILY 11/19/17  Yes Nida, Marella Chimes, MD  Continuous Blood Gluc Sensor ([place]) MISC Use one sensor every 10 days. 10/14/17  Yes Nida, Marella Chimes, MD  glipiZIDE (GLUCOTROL XL) 10 MG 24 hr tablet TAKE 1 TABLET BY MOUTH ONCE DAILY FOR SUGARS 04/04/19  Yes [provider]  insulin glargine (LANTUS) 100 UNIT/ML injection Inject 30 Units into the skin at bedtime.   Yes [provider]  latanoprost (XALATAN) 0.005 % ophthalmic solution Place 1 drop into both eyes at bedtime.   Yes [provider]  lenalidomide (REVLIMID) 25 MG capsule Take 1 pill daily at bedtime for 21 days on and 7 days off. ZOXW#[id number] 05/23/19  Yes [name], MD  lisinopril (PRINIVIL,ZESTRIL) 10 MG tablet TAKE 1 TABLET BY MOUTH ONCE DAILY 04/29/17  Yes Nida, Marella Chimes, MD  NOVOLOG 100 UNIT/ML injection INJECT 35 UNITS SUBCUTANEOUSLY WITH MEALS 05/06/19  Yes [provider]  ondansetron (ZOFRAN) 8 MG tablet Take 1 tablet (8 mg total) by mouth 2 (two) times daily as needed (Nausea or vomiting). 05/17/19  Yes Ennever, Rudell Cobb, MD  ONETOUCH VERIO test strip USE 1 STRIP TO CHECK GLUCOSE 4 TIMES DAILY 09/29/17  Yes Cassandria Anger, MD  PARoxetine (PAXIL) 30 MG tablet Take 30 mg by mouth every morning. 07/12/18  Yes [provider]  prochlorperazine (COMPAZINE) 10 MG tablet Take 1 tablet (10 mg total) by mouth every 6 (six) hours as needed (Nausea or vomiting). 05/17/19  Yes [name], MD  famciclovir (FAMVIR) 250 MG tablet Take 1 tablet (250 mg total) by mouth daily. Patient not taking: Reported on 05/25/2019 04/29/19   [name], MD  HYDROcodone-acetaminophen (NORCO/VICODIN) 5-325 MG tablet Take 1 tablet by mouth every 6 (six)  hours as needed for moderate pain. Patient not taking: Reported on 05/25/2019 04/22/19   [name], MD  insulin lispro (HUMALOG KWIKPEN) 100 UNIT/ML KiwkPen Inject 0.22-0.28 mLs (22-28 Units total) into the skin 3 (three) times daily with meals. Patient not taking: Reported on 05/25/2019 01/19/18   Cassandria Anger, MD  ondansetron (ZOFRAN) 8 MG tablet Take 1 tablet (8 mg total) by mouth 2 (two) times daily. Patient not taking: Reported on 05/25/2019 05/17/19   [name], MD    I have reviewed the patient's current medications.  Labs:  Results for orders placed or performed during the hospital encounter of 05/24/19 (from the past 48 hour(s))  SARS Coronavirus 2 (CEPHEID - Performed in [facility] hospital lab), Hosp Order     Status: None   Collection Time: 05/24/19  2:19 PM  Result Value Ref Range   SARS Coronavirus 2 NEGATIVE NEGATIVE    Comment: (NOTE) If result is NEGATIVE SARS-CoV-2 target nucleic acids are NOT DETECTED. The SARS-CoV-2 RNA is generally detectable in upper and lower  respiratory specimens during the acute phase of infection. The lowest  concentration of SARS-CoV-2 viral copies this assay can detect is 250  copies / mL. A negative result does not preclude SARS-CoV-2 infection  and should not be used as the sole basis for treatment or other  patient management decisions.  A negative result may occur with  improper specimen collection / handling, submission of specimen other  than nasopharyngeal swab, presence of viral mutation(s) within the  areas targeted by this assay, and inadequate number of viral copies  (<250 copies / mL). A negative result must be combined with clinical  observations, patient history, and epidemiological information. If result is POSITIVE SARS-CoV-2 target nucleic acids are DETECTED. The SARS-CoV-2 RNA is generally detectable in upper and lower  respiratory specimens dur ing the acute phase of infection.  Positive  results are  indicative of active infection with SARS-CoV-2.  Clinical  correlation with patient history and other diagnostic information is  necessary to  determine patient infection status.  Positive results do  not rule out bacterial infection or co-infection with other viruses. If result is PRESUMPTIVE POSTIVE SARS-CoV-2 nucleic acids MAY BE PRESENT.   A presumptive positive result was obtained on the submitted specimen  and confirmed on repeat testing.  While 2019 novel coronavirus  (SARS-CoV-2) nucleic acids may be present in the submitted sample  additional confirmatory testing may be necessary for epidemiological  and / or clinical management purposes  to differentiate between  SARS-CoV-2 and other Sarbecovirus currently known to infect humans.  If clinically indicated additional testing with an alternate test  methodology [phone]) is advised. The SARS-CoV-2 RNA is generally  detectable in upper and lower respiratory sp ecimens during the acute  phase of infection. The expected result is Negative. Fact Sheet for Patients:  StrictlyIdeas.no Fact Sheet for Healthcare Providers: BankingDealers.co.za This test is not yet approved or cleared by the [place] FDA and has been authorized for detection and/or diagnosis of SARS-CoV-2 by FDA under an Emergency Use Authorization (EUA).  This EUA will remain in effect (meaning this test can be used) for the duration of the COVID-19 declaration under Section 564(b)(1) of the Act, 21 U.S.C. section 360bbb-3(b)(1), unless the authorization is terminated or revoked sooner. Performed at [facility], [address], [place], [place] [address]   Culture, blood (Routine X 2) w Reflex to ID Panel     Status: None (Preliminary result)   Collection Time: 05/24/19  3:19 PM  Result Value Ref Range   Specimen Description BLOOD RIGHT ANTECUBITAL    Special Requests      BOTTLES DRAWN AEROBIC AND ANAEROBIC Blood  Culture adequate volume   Culture      NO GROWTH 2 DAYS Performed at [facility], [address] [place], [place] [address]    Report Status PENDING   Culture, blood (Routine X 2) w Reflex to ID Panel     Status: None (Preliminary result)   Collection Time: 05/24/19  3:19 PM  Result Value Ref Range   Specimen Description BLOOD BLOOD RIGHT WRIST    Special Requests      BOTTLES DRAWN AEROBIC AND ANAEROBIC Blood Culture adequate volume   Culture      NO GROWTH 2 DAYS Performed at [facility], [address] [place], [place] [address]    Report Status PENDING   Hemoglobin A1c     Status: Abnormal   Collection Time: 05/24/19  3:19 PM  Result Value Ref Range   Hgb A1c MFr Bld 8.5 (H) 4.8 - 5.6 %    Comment: (NOTE) Pre diabetes:          5.7%-6.4% Diabetes:              >6.4% Glycemic control for   <7.0% adults with diabetes    Mean Plasma Glucose 197.25 mg/dL    Comment: Performed at [facility], [place] [address], [place], [place] [address]  Magnesium     Status: None   Collection Time: 05/24/19  3:19 PM  Result Value Ref Range   Magnesium 1.7 1.7 - 2.4 mg/dL    Comment: Performed at [facility], [address], [place], [place] [address]  Lactic acid, plasma     Status: None   Collection Time: 05/24/19  3:20 PM  Result Value Ref Range   Lactic Acid, Venous 1.3 0.5 - 1.9 mmol/L    Comment: Performed at [facility], [address] [place],  [address]  Glucose,  capillary     Status: None   Collection Time: 05/24/19  4:28 PM  Result Value Ref Range   Glucose-Capillary 88 70 - 99 mg/dL  Glucose, capillary     Status: Abnormal   Collection Time: 05/24/19  9:01 PM  Result Value Ref Range   Glucose-Capillary 157 (H) 70 - 99 mg/dL  Glucose, capillary     Status: Abnormal   Collection Time: 05/25/19  2:52 AM  Result Value Ref Range   Glucose-Capillary 139 (H) 70 - 99 mg/dL  Comprehensive metabolic panel     Status: Abnormal   Collection Time: 05/25/19  4:43 AM   Result Value Ref Range   Sodium 136 135 - 145 mmol/L   Potassium 4.1 3.5 - 5.1 mmol/L   Chloride 106 98 - 111 mmol/L   CO2 22 22 - 32 mmol/L   Glucose, Bld 162 (H) 70 - 99 mg/dL   BUN 16 8 - 23 mg/dL   Creatinine, Ser 2.17 (H) 0.44 - 1.00 mg/dL   Calcium 7.7 (L) 8.9 - 10.3 mg/dL   Total Protein 5.9 (L) 6.5 - 8.1 g/dL   Albumin 2.5 (L) 3.5 - 5.0 g/dL   AST 25 15 - 41 U/L   ALT 36 0 - 44 U/L   Alkaline Phosphatase 92 38 - 126 U/L   Total Bilirubin 0.7 0.3 - 1.2 mg/dL   GFR calc non Af Amer 22 (L) >60 mL/min   GFR calc Af Amer 26 (L) >60 mL/min   Anion gap 8 5 - 15    Comment: Performed at [facility], [address], [place], [place] [address]  Magnesium     Status: None   Collection Time: 05/25/19  4:43 AM  Result Value Ref Range   Magnesium 1.7 1.7 - 2.4 mg/dL    Comment: Performed at [facility], [address] [place], [place] [address]  CBC WITH DIFFERENTIAL     Status: Abnormal   Collection Time: 05/25/19  4:43 AM  Result Value Ref Range   WBC 1.0 (LL) 4.0 - 10.5 K/uL    Comment: REPEATED TO VERIFY WHITE COUNT CONFIRMED ON SMEAR CRITICAL VALUE NOTED.  VALUE IS CONSISTENT WITH PREVIOUSLY REPORTED AND CALLED VALUE. THIS CRITICAL RESULT HAS VERIFIED AND BEEN CALLED TO JOHNSON,B BY SHERRI HUFFINES ON 06 03 2020 AT 0654, AND HAS BEEN READ BACK.     RBC 2.37 (L) 3.87 - 5.11 MIL/uL   Hemoglobin 7.5 (L) 12.0 - 15.0 g/dL   HCT 23.6 (L) 36.0 - 46.0 %   MCV 99.6 80.0 - 100.0 fL   MCH 31.6 26.0 - 34.0 pg   MCHC 31.8 30.0 - 36.0 g/dL   RDW 14.9 11.5 - 15.5 %   Platelets 38 (L) 150 - 400 K/uL    Comment: REPEATED TO VERIFY PLATELET COUNT CONFIRMED BY SMEAR SPECIMEN CHECKED FOR CLOTS Immature Platelet Fraction may be clinically indicated, consider ordering this additional test [id number]    nRBC 0.0 0.0 - 0.2 %   Neutrophils Relative % 50 %   Neutro Abs 0.5 (L) 1.7 - 7.7 K/uL   Lymphocytes Relative 25 %   Lymphs Abs 0.2 (L) 0.7 - 4.0 K/uL   Monocytes Relative 18 %    Monocytes Absolute 0.2 0.1 - 1.0 K/uL   Eosinophils Relative 3 %   Eosinophils Absolute 0.0 0.0 - 0.5 K/uL   Basophils Relative 1 %   Basophils Absolute 0.0 0.0 - 0.1 K/uL   Immature Granulocytes 3 %  Abs Immature Granulocytes 0.03 0.00 - 0.07 K/uL    Comment: Performed at [facility], [address] [place], [place] [address]  Glucose, capillary     Status: Abnormal   Collection Time: 05/25/19  7:17 AM  Result Value Ref Range   Glucose-Capillary 168 (H) 70 - 99 mg/dL  Glucose, capillary     Status: Abnormal   Collection Time: 05/25/19 11:09 AM  Result Value Ref Range   Glucose-Capillary 106 (H) 70 - 99 mg/dL  Glucose, capillary     Status: None   Collection Time: 05/25/19  4:34 PM  Result Value Ref Range   Glucose-Capillary 85 70 - 99 mg/dL  Glucose, capillary     Status: Abnormal   Collection Time: 05/25/19 10:12 PM  Result Value Ref Range   Glucose-Capillary 122 (H) 70 - 99 mg/dL  Comprehensive metabolic panel     Status: Abnormal   Collection Time: 05/26/19  4:54 AM  Result Value Ref Range   Sodium 136 135 - 145 mmol/L   Potassium 4.0 3.5 - 5.1 mmol/L   Chloride 109 98 - 111 mmol/L   CO2 20 (L) 22 - 32 mmol/L   Glucose, Bld 123 (H) 70 - 99 mg/dL   BUN 24 (H) 8 - 23 mg/dL   Creatinine, Ser 3.86 (H) 0.44 - 1.00 mg/dL    Comment: DELTA CHECK NOTED   Calcium 7.5 (L) 8.9 - 10.3 mg/dL   Total Protein 5.9 (L) 6.5 - 8.1 g/dL   Albumin 2.4 (L) 3.5 - 5.0 g/dL   AST 19 15 - 41 U/L   ALT 37 0 - 44 U/L   Alkaline Phosphatase 101 38 - 126 U/L   Total Bilirubin 0.7 0.3 - 1.2 mg/dL   GFR calc non Af Amer 11 (L) >60 mL/min   GFR calc Af Amer 13 (L) >60 mL/min   Anion gap 7 5 - 15    Comment: Performed at [facility], [address] [place], [place] [address]  CBC WITH DIFFERENTIAL     Status: Abnormal   Collection Time: 05/26/19  4:54 AM  Result Value Ref Range   WBC 0.7 (LL) 4.0 - 10.5 K/uL    Comment: REPEATED TO VERIFY WHITE COUNT CONFIRMED ON SMEAR CRITICAL VALUE NOTED.   VALUE IS CONSISTENT WITH PREVIOUSLY REPORTED AND CALLED VALUE. THIS CRITICAL RESULT HAS VERIFIED AND BEEN CALLED TO THOMAS,C BY SHERRI HUFFINES ON 06 04 2020 AT 0657, AND HAS BEEN READ BACK.     RBC 2.20 (L) 3.87 - 5.11 MIL/uL   Hemoglobin 7.1 (L) 12.0 - 15.0 g/dL   HCT 21.9 (L) 36.0 - 46.0 %   MCV 99.5 80.0 - 100.0 fL   MCH 32.3 26.0 - 34.0 pg   MCHC 32.4 30.0 - 36.0 g/dL   RDW 15.3 11.5 - 15.5 %   Platelets 39 (L) 150 - 400 K/uL    Comment: REPEATED TO VERIFY Immature Platelet Fraction may be clinically indicated, consider ordering this additional test [id number] CONSISTENT WITH PREVIOUS RESULT    nRBC 0.0 0.0 - 0.2 %   Neutrophils Relative % 53 %   Neutro Abs 0.4 (L) 1.7 - 7.7 K/uL   Lymphocytes Relative 24 %   Lymphs Abs 0.2 (L) 0.7 - 4.0 K/uL   Monocytes Relative 13 %   Monocytes Absolute 0.1 0.1 - 1.0 K/uL   Eosinophils Relative 8 %   Eosinophils Absolute 0.1 0.0 - 0.5 K/uL   Basophils Relative 1 %   Basophils Absolute  0.0 0.0 - 0.1 K/uL   WBC Morphology INCREASED BANDS (>20% BANDS)    Immature Granulocytes 1 %   Abs Immature Granulocytes 0.01 0.00 - 0.07 K/uL    Comment: Performed at [facility], [address], [place], [place] [address]  CK     Status: None   Collection Time: 05/26/19  4:54 AM  Result Value Ref Range   Total CK 137 38 - 234 U/L    Comment: Performed at [facility], [address] [place], [place] [address]  Glucose, capillary     Status: Abnormal   Collection Time: 05/26/19  5:41 AM  Result Value Ref Range   Glucose-Capillary 111 (H) 70 - 99 mg/dL  Glucose, capillary     Status: Abnormal   Collection Time: 05/26/19  7:27 AM  Result Value Ref Range   Glucose-Capillary 170 (H) 70 - 99 mg/dL     ROS:  Pertinent items noted in HPI and remainder of comprehensive ROS otherwise negative.  Physical Exam: Vitals:   05/25/19 2212 05/26/19 0544  BP: (!) 100/53 (!) 99/47  Pulse: 68 69  Resp: 20 16  Temp: 99.2 F (37.3 C) 98.3 F (36.8 C)   SpO2: 95% 96%     General exam: Appears calm and comfortable  Respiratory system: Clear to auscultation. Respiratory effort normal. No wheezing or crackle Cardiovascular system: S1 & S2 heard, RRR.  No pedal edema. Gastrointestinal system: Abdomen is nondistended, soft and nontender. Normal bowel sounds heard. Central nervous system: Alert and oriented. No focal neurological deficits. Extremities: Symmetric 5 x 5 power. Skin: left external ear skin infection Psychiatry: Judgement and insight appear normal. Mood & affect appropriate.   Assessment/Plan:  #AKI on CKD likely hemodynamically mediated in the setting of hypotension and lisinopril use.  CKD probably due to multiple myeloma and poorly controlled diabetes.  Ultrasound of kidneys with no obstruction or mass however right kidney is big. -Check urinalysis, urine sodium, bladder scan -Strict ins and out -Continue IV fluid -Hold lisinopril and avoid any NSAIDs or IV contrast  #Multiple myeloma (IgG Kappa LC): Discontinue Revlimid and now on Velcade per oncologist.  The kappa lambda ratio improved to 20 from 90.32.  Follow with oncology.  #Pancytopenia due to chemo.  Receiving Neulasta.  #Superficial skin MRSA infection: On ceftaroline, per primary team.  #Hypotension: Hold lisinopril.  Monitor blood pressure.  Thank you for the consult.  We will continue to follow.  Dron Tanna Furry 05/26/2019, 9:05 AM  [employer].

## 2019-05-27 LAB — GLUCOSE, CAPILLARY
Glucose-Capillary: 123 mg/dL — ABNORMAL HIGH (ref 70–99)
Glucose-Capillary: 96 mg/dL (ref 70–99)
Glucose-Capillary: 83 mg/dL (ref 70–99)
Glucose-Capillary: 105 mg/dL — ABNORMAL HIGH (ref 70–99)
Glucose-Capillary: 127 mg/dL — ABNORMAL HIGH (ref 70–99)

## 2019-05-27 LAB — COMPREHENSIVE METABOLIC PANEL
ALT: 35 U/L (ref 0–44)
AST: 17 U/L (ref 15–41)
Albumin: 2.4 g/dL — ABNORMAL LOW (ref 3.5–5.0)
Alkaline Phosphatase: 110 U/L (ref 38–126)
Anion gap: 6 (ref 5–15)
BUN: 29 mg/dL — ABNORMAL HIGH (ref 8–23)
CO2: 21 mmol/L — ABNORMAL LOW (ref 22–32)
Calcium: 8 mg/dL — ABNORMAL LOW (ref 8.9–10.3)
Chloride: 114 mmol/L — ABNORMAL HIGH (ref 98–111)
Creatinine, Ser: 4.22 mg/dL — ABNORMAL HIGH (ref 0.44–1.00)
GFR calc Af Amer: 12 mL/min — ABNORMAL LOW (ref >60)
GFR calc non Af Amer: 10 mL/min — ABNORMAL LOW (ref >60)
Glucose, Bld: 115 mg/dL — ABNORMAL HIGH (ref 70–99)
Potassium: 4.8 mmol/L (ref 3.5–5.1)
Sodium: 141 mmol/L (ref 135–145)
Total Bilirubin: 0.7 mg/dL (ref 0.3–1.2)
Total Protein: 6 g/dL — ABNORMAL LOW (ref 6.5–8.1)

## 2019-05-27 LAB — CBC WITH DIFFERENTIAL/PLATELET
Abs Immature Granulocytes: 0.21 10*3/uL — ABNORMAL HIGH (ref 0.00–0.07)
Basophils Absolute: 0 10*3/uL (ref 0.0–0.1)
Basophils Relative: 1 %
Eosinophils Absolute: 0.1 10*3/uL (ref 0.0–0.5)
Eosinophils Relative: 6 %
HCT: 22.3 % — ABNORMAL LOW (ref 36.0–46.0)
Hemoglobin: 7.1 g/dL — ABNORMAL LOW (ref 12.0–15.0)
Immature Granulocytes: 13 %
Lymphocytes Relative: 21 %
Lymphs Abs: 0.3 10*3/uL — ABNORMAL LOW (ref 0.7–4.0)
MCH: 32.6 pg (ref 26.0–34.0)
MCHC: 31.8 g/dL (ref 30.0–36.0)
MCV: 102.3 fL — ABNORMAL HIGH (ref 80.0–100.0)
Monocytes Absolute: 0.2 10*3/uL (ref 0.1–1.0)
Monocytes Relative: 11 %
Neutro Abs: 0.8 10*3/uL — ABNORMAL LOW (ref 1.7–7.7)
Neutrophils Relative %: 48 %
Platelets: 38 10*3/uL — ABNORMAL LOW (ref 150–400)
RBC: 2.18 MIL/uL — ABNORMAL LOW (ref 3.87–5.11)
RDW: 15.5 % (ref 11.5–15.5)
WBC Morphology: INCREASED
WBC: 1.6 10*3/uL — ABNORMAL LOW (ref 4.0–10.5)
nRBC: 0 % (ref 0.0–0.2)

## 2019-05-27 LAB — VANCOMYCIN, TROUGH: Vancomycin Tr: 28 ug/mL (ref 15–20)

## 2019-05-27 MED ORDER — FILGRASTIM 480 MCG/1.6ML IJ SOLN
480.0000 ug | Freq: Every day | INTRAMUSCULAR | Status: DC
  Administered 2019-05-27 – 2019-05-29 (×3): 480 ug via SUBCUTANEOUS
  Filled 2019-05-27 (×4): qty 1.6

## 2019-05-27 MED ORDER — SODIUM CHLORIDE 0.9 % IV SOLN
INTRAVENOUS | Status: AC
  Administered 2019-05-27 (×2): via INTRAVENOUS

## 2019-05-27 NOTE — Care Management Important Message (Signed)
Important Message  Patient Details  Name: Vicki Moon MRN: [id number] Date of Birth: 07-25-1951   Medicare Important Message Given:  Yes    Tommy Medal 05/27/2019, 1:54 PM

## 2019-05-27 NOTE — Progress Notes (Signed)
CRITICAL VALUE ALERT  Critical Value:  Vancomycin trough 28  Date & Time Notied:  05/27/2019 0718  Provider Notified: Dr.Tat  Orders Received/Actions taken: no orders given at this time

## 2019-05-27 NOTE — Progress Notes (Signed)
[facility] NEPHROLOGY PROGRESS NOTE  Assessment/ Plan: Pt is a 68 y.o. yo female  with history of diabetes with A1c 8.5, recently diagnosed with multiple myeloma follows with Dr. [name] on Velcade, recurrent skin infection with MRSA, sent to the hospital for pancytopenia. We are consulted for AKI. She was hypotensive to 80s on admission.  #AKI on CKD likely hemodynamically mediated in the setting of hypotension and lisinopril use.  CKD probably due to multiple myeloma and poorly controlled diabetes.  Ultrasound of kidneys with no obstruction or mass however right kidney is big. Urinalysis unremarkable.  -The serum creatinine level has gone up to 4.2 from 3.8.  No exact measurement of urine output, I noticed around 500 cc of urine in the container.  Hopefully the creatinine will plateau soon.  Continue gentle IV hydration for another 24 hours. -Need  strict ins and out, discussed with nurse. -Continue to hold lisinopril and avoid any NSAIDs or IV contrast. Noted high vanc level today, dose medications as per current GFR around 10.  #Multiple myeloma (IgG Kappa LC): Discontinued Revlimid and now on Velcade per oncologist.  The kappa lambda ratio improved to 20 from 90.32.  Follow with oncology.  #Pancytopenia due to chemo.  Receiving Neulasta.  Per oncology  #Superficial skin MRSA infection, furuncle: Vanc level high 28 today, per primary team.  #Hypotension: Hold lisinopril.  BP better now. Monitor blood pressure.  Thank you for the consult.  We will continue to follow.  Subjective: Seen and examined.  Reports feeling good except intermittent headache.  Denied cough, shortness of breath, chest pain, nausea, vomiting, abdomen pain.  No leg swelling.  Reports good urine output. Objective Vital signs in last 24 hours: Vitals:   05/26/19 1549 05/26/19 2040 05/26/19 2248 05/27/19 0559  BP: (!) 121/49  (!) 117/47 (!) 125/47  Pulse: 68  75 76  Resp: 16  20 16   Temp: 98.3 F  (36.8 C)  98.5 F (36.9 C) 98.2 F (36.8 C)  TempSrc: Oral  Oral Oral  SpO2: 100% 93% 97% 100%  Weight:      Height:       Weight change:   Intake/Output Summary (Last 24 hours) at 05/27/2019 0853 Last data filed at 05/27/2019 0542 Gross per 24 hour  Intake 1904.82 ml  Output 525 ml  Net 1379.82 ml       Labs: Basic Metabolic Panel: Recent Labs  Lab 05/25/19 0443 05/26/19 0454 05/27/19 0511  NA 136 136 141  K 4.1 4.0 4.8  CL 106 109 114*  CO2 22 20* 21*  GLUCOSE 162* 123* 115*  BUN 16 24* 29*  CREATININE 2.17* 3.86* 4.22*  CALCIUM 7.7* 7.5* 8.0*   Liver Function Tests: Recent Labs  Lab 05/25/19 0443 05/26/19 0454 05/27/19 0511  AST 25 19 17   ALT 36 37 35  ALKPHOS 92 101 110  BILITOT 0.7 0.7 0.7  PROT 5.9* 5.9* 6.0*  ALBUMIN 2.5* 2.4* 2.4*   No results for input(s): LIPASE, AMYLASE in the last 168 hours. No results for input(s): AMMONIA in the last 168 hours. CBC: Recent Labs  Lab 05/24/19 0856 05/25/19 0443 05/26/19 0454  WBC 1.1* 1.0* 0.7*  NEUTROABS 0.6* 0.5* 0.4*  HGB 8.0* 7.5* 7.1*  HCT 23.9* 23.6* 21.9*  MCV 96.8 99.6 99.5  PLT 43* 38* 39*   Cardiac Enzymes: Recent Labs  Lab 05/26/19 0454  CKTOTAL 137   CBG: Recent Labs  Lab 05/26/19 1119 05/26/19 1637 05/26/19 2203 05/27/19 0258 05/27/19  0757  GLUCAP 128* 119* 161* 123* 96    Iron Studies: No results for input(s): IRON, TIBC, TRANSFERRIN, FERRITIN in the last 72 hours. Studies/Results: US Renal  Result Date: 05/25/2019 CLINICAL DATA:  Acute kidney injury. EXAM: RENAL / URINARY TRACT ULTRASOUND COMPLETE COMPARISON:  Ultrasound of December 18, 2018. FINDINGS: Right Kidney: Renal measurements: 15.1 x 7.7 x 6.3 cm = volume: 380 mL . Echogenicity within normal limits. No mass or hydronephrosis visualized. Left Kidney: Renal measurements: 12.6 x 6.5 x 6.4 cm = volume: 270 mL. Echogenicity within normal limits. No mass or hydronephrosis visualized. Bladder: Appears normal for degree of  bladder distention. Ureteral jets are not visualized. IMPRESSION: No significant renal abnormality is noted. Electronically Signed   By: Marijo Conception M.D.   On: 05/25/2019 16:04    Medications: Infusions: . 0.9 % NaCl with KCl 20 mEq / L 100 mL/hr at 05/26/19 2121    Scheduled Medications: . aspirin  325 mg Oral Daily  . atorvastatin  10 mg Oral q1800  . famciclovir  250 mg Oral Daily  . filgrastim (NEUPOGEN)  SQ  480 mcg Subcutaneous Daily  . insulin aspart  0-15 Units Subcutaneous TID WC  . insulin aspart  0-5 Units Subcutaneous QHS  . insulin glargine  25 Units Subcutaneous QHS  . latanoprost  1 drop Both Eyes QHS  . mupirocin cream   Topical BID  . mupirocin ointment   Nasal BID  . PARoxetine  30 mg Oral q morning - 10a    have reviewed scheduled and prn medications.  Physical Exam: General:NAD, comfortable Heart:RRR, s1s2 nl Lungs:clear b/l, no crackle or wheezing Abdomen:soft, Non-tender, non-distended Extremities:No edema Neurology: Alert, awake, following commands, no asterixis  Jozette Castrellon Prasad Jamesrobert Ohanesian 05/27/2019,8:53 AM  LOS: 3 days

## 2019-05-27 NOTE — Progress Notes (Addendum)
PROGRESS NOTE  Vicki Moon XTG:[id number] DOB: 1951/12/22 DOA: 05/24/2019 PCP: [name], MD  Brief History: 68 year old female with a history of diabetes mellitus, myeloma, and MRSA skin infections presenting with cellulitis and increasing number of furuncles. The patient went to see her oncologist, Dr. [name] 05/24/2019. She was scheduled to receive her dose of Velcade. However, she was noted to have increasing number of furuncles on her skin. As result, her Velcade was held. It was also held in part due to the patient's pancytopenia. Because of increasing number of furuncles, the patient was recommended to go to the emergency department for further evaluation and intravenous antibiotics. Upon presentation, the patient had temperature 100.0 F but was hemodynamically stable. WBC was the 1.1. The patient was started on vancomycin IV.  Assessment/Plan: Cellulitis right groin/furunculosis -discontinue vancomycin IV due to renal failure -Continue mupirocin nasal therapy for decolonization -Chlorhexidine baths -improving -vanc trough still therapeutic  AKI -likely due to hemodynamic changes in the setting of lisinopril -baseline creatinine 0.9-1.1 -serum creatinine continues to worsen -d/c lisinopril -renal us--neg hydronephrosis -consulted renal--appreciated -continue IVF  IgG kappa myeloma -Patient followed by Dr. [name] -Started on Revlimid 05/09/2019 -05/12/2019. PET scan showed patchy areas of hypermetabolism throughout the osseous skeleton -05/04/19 BM biopsy--showed marked increase in plasma cells. I am no histochemistry stain, she had 90% plasma cells. -received Xgeva 06/19/19  Pancytopenia -likely due to Revlimid--stopped 6/2 by Dr. [name] -continue Granix--started 6/4  Diabetes mellitus type 2, uncontrolled with hyperglycemia -12/18/2018--hemoglobin A1c 8.1 -Continue Lantus -Continue NovoLog sliding scale -6/3  A1C--8.4  Hyperlipidemia -Continue statin  Essential hypertension -Holding lisinopril secondary to acute kidney injury and soft BPs      Disposition Plan: Home in 2-3days when renal function improves Family Communication:NoFamily at bedside  Consultants:none  Code Status: FULL  DVT Prophylaxis: SCDS   Procedures: As Listed in Progress Note Above  Antibiotics: vanco6/2>>>     Subjective: Patient is feeling better today.  She still feels generalized weakness.  She denies any vomiting but has some nausea.  She denies any chest pain, shortness of breath, coughing, hemoptysis, diarrhea, abdominal pain.  She feels that her groin is improving.  There is no fevers, chills.  Headache is improved.  There is no neck pain.  Objective: Vitals:   05/26/19 2040 05/26/19 2248 05/27/19 0559 05/27/19 1330  BP:  (!) 117/47 (!) 125/47 (!) 121/38  Pulse:  75 76 75  Resp:  _0 Temp:  98.5 F (36.9 C) 98.2 F (36.8 C) 98.3 F (36.8 C)  TempSrc:  Oral Oral Oral  SpO2: 93% 97% 100% 98%  Weight:      Height:        Intake/Output Summary (Last 24 hours) at 05/27/2019 1905 Last data filed at 05/27/2019 1700 Gross per 24 hour  Intake 2744.82 ml  Output 1650 ml  Net 1094.82 ml   Weight change:  Exam:   General:  Pt is alert, follows commands appropriately, not in acute distress  HEENT: No icterus, No thrush, No neck mass, [place]/AT  Cardiovascular: RRR, S1/S2, no rubs, no gallops  Respiratory: CTA bilaterally, no wheezing, no crackles, no rhonchi  Abdomen: Soft/+BS, non tender, non distended, no guarding  Extremities: nonpitting edema, No lymphangitis, No petechiae, No rashes, no synovitis   Data Reviewed: I have personally reviewed following labs and imaging studies Basic Metabolic Panel: Recent Labs  Lab 05/24/19 0856 05/24/19 1519 05/25/19 0443 05/26/19 0454 05/27/19  0511  NA 138  --  136 136 141  K 3.2*  --  4.1 4.0 4.8  CL 103  --   106 109 114*  CO2 27  --  22 20* 21*  GLUCOSE 276*  --  162* 123* 115*  BUN 12  --  16 24* 29*  CREATININE 1.17*  --  2.17* 3.86* 4.22*  CALCIUM 7.8*  --  7.7* 7.5* 8.0*  MG  --  1.7 1.7  --   --    Liver Function Tests: Recent Labs  Lab 05/24/19 0856 05/25/19 0443 05/26/19 0454 05/27/19 0511  AST 14* _0 ALT 23 36 37 35  ALKPHOS 100 92 101 110  BILITOT 0.6 0.7 0.7 0.7  PROT 6.9 5.9* 5.9* 6.0*  ALBUMIN 3.4* 2.5* 2.4* 2.4*   No results for input(s): LIPASE, AMYLASE in the last 168 hours. No results for input(s): AMMONIA in the last 168 hours. Coagulation Profile: No results for input(s): INR, PROTIME in the last 168 hours. CBC: Recent Labs  Lab 05/24/19 0856 05/25/19 0443 05/26/19 0454 05/27/19 0955  WBC 1.1* 1.0* 0.7* 1.6*  NEUTROABS 0.6* 0.5* 0.4* 0.8*  HGB 8.0* 7.5* 7.1* 7.1*  HCT 23.9* 23.6* 21.9* 22.3*  MCV 96.8 99.6 99.5 102.3*  PLT 43* 38* 39* 38*   Cardiac Enzymes: Recent Labs  Lab 05/26/19 0454  CKTOTAL 137   BNP: Invalid input(s): POCBNP CBG: Recent Labs  Lab 05/26/19 2203 05/27/19 0258 05/27/19 0757 05/27/19 1150 05/27/19 1736  GLUCAP 161* 123* 96 83 105*   HbA1C: Recent Labs    05/25/19 0443 05/26/19 0452  HGBA1C 8.4* PENDING   Urine analysis:    Component Value Date/Time   COLORURINE STRAW (A) 05/26/2019 0905   APPEARANCEUR HAZY (A) 05/26/2019 0905   LABSPEC 1.008 05/26/2019 0905   PHURINE 6.0 05/26/2019 0905   GLUCOSEU NEGATIVE 05/26/2019 0905   HGBUR NEGATIVE 05/26/2019 0905   BILIRUBINUR NEGATIVE 05/26/2019 0905   KETONESUR NEGATIVE 05/26/2019 0905   PROTEINUR NEGATIVE 05/26/2019 0905   UROBILINOGEN 0.2 07/11/2014 1440   NITRITE NEGATIVE 05/26/2019 0905   LEUKOCYTESUR NEGATIVE 05/26/2019 0905   Sepsis Labs: _1 (procalcitonin:4,lacticidven:4) ) Recent Results (from the past 240 hour(s))  SARS Coronavirus 2 (CEPHEID - Performed in [facility] hospital lab), Hosp Order     Status: None   Collection Time:  05/24/19  2:19 PM  Result Value Ref Range Status   SARS Coronavirus 2 NEGATIVE NEGATIVE Final    Comment: (NOTE) If result is NEGATIVE SARS-CoV-2 target nucleic acids are NOT DETECTED. The SARS-CoV-2 RNA is generally detectable in upper and lower  respiratory specimens during the acute phase of infection. The lowest  concentration of SARS-CoV-2 viral copies this assay can detect is 250  copies / mL. A negative result does not preclude SARS-CoV-2 infection  and should not be used as the sole basis for treatment or other  patient management decisions.  A negative result may occur with  improper specimen collection / handling, submission of specimen other  than nasopharyngeal swab, presence of viral mutation(s) within the  areas targeted by this assay, and inadequate number of viral copies  (<250 copies / mL). A negative result must be combined with clinical  observations, patient history, and epidemiological information. If result is POSITIVE SARS-CoV-2 target nucleic acids are DETECTED. The SARS-CoV-2 RNA is generally detectable in upper and lower  respiratory specimens dur ing the acute phase of infection.  Positive  results are indicative of active infection with  SARS-CoV-2.  Clinical  correlation with patient history and other diagnostic information is  necessary to determine patient infection status.  Positive results do  not rule out bacterial infection or co-infection with other viruses. If result is PRESUMPTIVE POSTIVE SARS-CoV-2 nucleic acids MAY BE PRESENT.   A presumptive positive result was obtained on the submitted specimen  and confirmed on repeat testing.  While 2019 novel coronavirus  (SARS-CoV-2) nucleic acids may be present in the submitted sample  additional confirmatory testing may be necessary for epidemiological  and / or clinical management purposes  to differentiate between  SARS-CoV-2 and other Sarbecovirus currently known to infect humans.  If clinically  indicated additional testing with an alternate test  methodology [phone]) is advised. The SARS-CoV-2 RNA is generally  detectable in upper and lower respiratory sp ecimens during the acute  phase of infection. The expected result is Negative. Fact Sheet for Patients:  StrictlyIdeas.no Fact Sheet for Healthcare Providers: BankingDealers.co.za This test is not yet approved or cleared by the [place] FDA and has been authorized for detection and/or diagnosis of SARS-CoV-2 by FDA under an Emergency Use Authorization (EUA).  This EUA will remain in effect (meaning this test can be used) for the duration of the COVID-19 declaration under Section 564(b)(1) of the Act, 21 U.S.C. section 360bbb-3(b)(1), unless the authorization is terminated or revoked sooner. Performed at [facility], [address], [place], [place] [address]   Culture, blood (Routine X 2) w Reflex to ID Panel     Status: None (Preliminary result)   Collection Time: 05/24/19  3:19 PM  Result Value Ref Range Status   Specimen Description BLOOD RIGHT ANTECUBITAL  Final   Special Requests   Final    BOTTLES DRAWN AEROBIC AND ANAEROBIC Blood Culture adequate volume   Culture   Final    NO GROWTH 3 DAYS Performed at [facility], [address] [place], [place] [address]    Report Status PENDING  Incomplete  Culture, blood (Routine X 2) w Reflex to ID Panel     Status: None (Preliminary result)   Collection Time: 05/24/19  3:19 PM  Result Value Ref Range Status   Specimen Description BLOOD BLOOD RIGHT WRIST  Final   Special Requests   Final    BOTTLES DRAWN AEROBIC AND ANAEROBIC Blood Culture adequate volume   Culture   Final    NO GROWTH 3 DAYS Performed at [facility], [address], [place], [place] [address]    Report Status PENDING  Incomplete     Scheduled Meds:  aspirin  325 mg Oral Daily   atorvastatin  10 mg Oral q1800   famciclovir  250 mg Oral Daily    filgrastim (NEUPOGEN)  SQ  480 mcg Subcutaneous Daily   insulin aspart  0-15 Units Subcutaneous TID WC   insulin aspart  0-5 Units Subcutaneous QHS   insulin glargine  25 Units Subcutaneous QHS   latanoprost  1 drop Both Eyes QHS   mupirocin cream   Topical BID   mupirocin ointment   Nasal BID   PARoxetine  30 mg Oral q morning - 10a   Continuous Infusions:  sodium chloride 75 mL/hr at 05/27/19 0940    Procedures/Studies: US Renal  Result Date: 05/25/2019 CLINICAL DATA:  Acute kidney injury. EXAM: RENAL / URINARY TRACT ULTRASOUND COMPLETE COMPARISON:  Ultrasound of December 18, 2018. FINDINGS: Right Kidney: Renal measurements: 15.1 x 7.7 x 6.3 cm = volume: 380 mL . Echogenicity within normal limits. No  mass or hydronephrosis visualized. Left Kidney: Renal measurements: 12.6 x 6.5 x 6.4 cm = volume: 270 mL. Echogenicity within normal limits. No mass or hydronephrosis visualized. Bladder: Appears normal for degree of bladder distention. Ureteral jets are not visualized. IMPRESSION: No significant renal abnormality is noted. Electronically Signed   By: Marijo Conception M.D.   On: 05/25/2019 16:04   Nm Pet Image Initial (pi) Whole Body  Result Date: 05/12/2019 CLINICAL DATA:  Initial treatment strategy for multiple myeloma. EXAM: NUCLEAR MEDICINE PET WHOLE BODY TECHNIQUE: 10.0 mCi F-18 FDG was injected intravenously. Full-ring PET imaging was performed from the skull base to thigh after the radiotracer. CT data was obtained and used for attenuation correction and anatomic localization. Fasting blood glucose: 198 mg/dl COMPARISON:  CT chest abdomen pelvis 04/25/2019. FINDINGS: Mediastinal blood pool activity: SUV max 3.9 HEAD/NECK: Mildly asymmetric uptake within the left nasopharynx, without a CT correlate. Otherwise, abnormal hypermetabolism in the head/neck. Incidental CT findings: None. CHEST: No hypermetabolic mediastinal, hilar or axillary lymph nodes. No hypermetabolic pulmonary  nodules. Incidental CT findings: Atherosclerotic calcification of the aorta and coronary arteries. Heart is enlarged. No pericardial or pleural effusion. There may be a few scattered millimetric pulmonary nodules which are distorted due to respiratory motion and expiratory phase imaging. ABDOMEN/PELVIS: No abnormal hypermetabolism in the liver, adrenal glands, spleen or pancreas. No hypermetabolic pathologically enlarged lymph nodes. Incidental CT findings: Liver, adrenal glands and right kidney are unremarkable. Tiny stone in the lower pole left kidney. Spleen, pancreas, stomach and bowel are otherwise unremarkable. Cholecystectomy. Uterus is visualized. There may be bladder wall thickening but the bladder is poorly distended. SKELETON: Focal uptake in anterior right ribs are associated with probable old fractures. Mild hypermetabolism associated with an L2 vertebral body augmentation. Incidental CT findings: L2 vertebral body augmentation. A 12 mm lytic lesion in the left aspect of S1 (series 4, image 181) is again seen and without definitive hypermetabolism. Subtle lysis within the inferior endplate of L5 is better seen on the comparison exam. EXTREMITIES: No abnormal hypermetabolism. Incidental CT findings: There is focal atrophy of the right calf musculature. IMPRESSION: 1. Patchy hypermetabolism throughout the visualized osseous structures. L5 and S1 lucencies do not appear focally hypermetabolic. 2. Left renal stone. 3. Aortic atherosclerosis (ICD10-170.0). Coronary artery calcification. Electronically Signed   By: Lorin Picket M.D.   On: 05/12/2019 09:10   Ir Bone Tumor(s)rf Ablation  Result Date: 04/28/2019 CLINICAL DATA:  68 year old female with an acute, painful pathologic fracture at L2. MRI imaging demonstrates a diffuse infiltrative marrow process concerning for multiple myeloma or lymphoma. She presents for transpedicular L2 biopsy, radiofrequency ablation and cement augmentation with  kyphoplasty. EXAM: FLUOROSCOPIC GUIDED L2 VERTEBRAL BODY BIOPSY RF ABLATION AND KYPHOPLASTY/CEMENT AUGMENTATION. COMPARISON:  MRI lumbar spine 04/15/2019 MEDICATIONS: Ancef 2 g IV; The antibiotic was administered in an appropriate time interval prior to needle puncture of the skin. ANESTHESIA/SEDATION: Versed 4 mg IV; Fentanyl 200 mcg IV Moderate Sedation Time: 52 minutes; The patient was continuously monitored during the procedure by the interventional radiology nurse under my direct supervision. FLUOROSCOPY TIME:  Fluoroscopy Time: 8 minutes 30 seconds (661 mGy). COMPLICATIONS: None immediate. TECHNIQUE: Informed written consent was obtained from the patient after a thorough discussion of the procedural risks, benefits and alternatives. All questions were addressed. Maximal Sterile Barrier Technique was utilized including caps, mask, sterile gowns, sterile gloves, sterile drape, hand hygiene and skin antiseptic. A timeout was performed prior to the initiation of the procedure. The patient was placed prone  on the fluoroscopic table. The skin overlying the lumbar region was then prepped and draped in the usual sterile fashion. Maximal barrier sterile technique was utilized including caps, mask, sterile gowns, sterile gloves, sterile drape, hand hygiene and skin antiseptic. Intravenous Fentanyl and Versed were administered as conscious sedation during continuous cardiorespiratory monitoring by the radiology RN. The left pedicle at L2 was then infiltrated with 1% lidocaine followed by the advancement of a Kyphon trocar needle through the left pedicle into the posterior one-third of the vertebral body. A biopsy specimen was then obtained. Subsequently, the osteo drill was advanced to the anterior third of the vertebral body. The osteo drill was retracted. Through the working cannula, a 10 mm OsteoCool RF ablation probe was inserted and positioned under fluoroscopic guidance. In similar fashion, the right L2 pedicle was  infiltrated with 1% lidocaine. Utilizing a extra pedicular approach, a second Kyphon trocar needle was advanced into the posterior third of the vertebral body. A biopsy specimen was then obtained. Subsequently, the osteo drill was coaxially advanced to the anterior right third. The osteo drill was exchanged for a 10 mm OsteoCool RF ablation probe which was positioned under fluoroscopic guidance. With both OsteoCool ablation probes in place, the ablation was performed for 15 minutes. Attention was now paid towards the kyphoplasty portion of the procedure. Beginning at the L2 vertebral body level, a Kyphon inflatable bone tamp 15 x 2 was advanced through both working cannulas and positioned with the distal marker approximately 5 mm from the anterior aspect of the cortex. Appropriate positioning was confirmed on the AP projection. At this time, the balloon was expanded using contrast via a Kyphon inflation syringe device via micro tubing. Inflations were continued under direct fluoroscopic guidance. At this time, methylmethacrylate mixture was reconstituted in the Kyphon bone mixing device system. This was then loaded into the delivery mechanism, attached to Kyphon bone fillers. The balloons were deflated and removed followed by the instillation of methylmethacrylate mixture with excellent filling in the AP and lateral projections. The working cannulae and the bone filler were then retrieved and removed. Multiple spot radiographic images were obtained in various obliquities. Hemostasis was achieved with manual compression. The patient tolerated the procedure well without immediate postprocedural complication. FINDINGS: Completion images demonstrate a technically excellent result with adequate cement filling of the L2 vertebral bodies on both the AP and lateral projections. No extravasation was noted posteriorly into the spinal canal. No epidural venous contamination was seen. IMPRESSION: Technically successful L2  vertebral body biopsy, ablation and cement augmentation using balloon kyphoplasty. PLAN: The patient will be seen for clinical follow-up at the interventional radiology clinic in 2-4 weeks. Signed, Criselda Peaches, MD [facility] Electronically Signed   By: Jacqulynn Cadet M.D.   On: 04/28/2019 17:04   Ct Biopsy  Result Date: 05/04/2019 INDICATION: 68 year old with multiple myeloma.  Request for bone marrow biopsy. EXAM: CT GUIDED BONE MARROW ASPIRATES AND BIOPSY Physician: Stephan Minister. Anselm Pancoast, MD MEDICATIONS: None. ANESTHESIA/SEDATION: Fentanyl 100 mcg IV; Versed 4.0 mg IV Moderate Sedation Time:  20 minutes The patient was continuously monitored during the procedure by the interventional radiology nurse under my direct supervision. COMPLICATIONS: None immediate. PROCEDURE: The procedure was explained to the patient. The risks and benefits of the procedure were discussed and the patient's questions were addressed. Informed consent was obtained from the patient. The patient was placed prone on CT table. Images of the pelvis were obtained. The right side of back was prepped  and draped in sterile fashion. The skin and right posterior ilium were anesthetized with 1% lidocaine. 11 gauge bone needle was directed into the right ilium with CT guidance. No aspirate material could be obtained. Therefore, 2 adequate sized core biopsies were obtained. Bandage placed over the puncture site. IMPRESSION: CT-guided bone marrow core biopsy. No aspirate material could be obtained. Electronically Signed   By: Markus Daft M.D.   On: 05/04/2019 13:16   Ct Bone Marrow Biopsy & Aspiration  Result Date: 05/04/2019 INDICATION: 68 year old with multiple myeloma.  Request for bone marrow biopsy. EXAM: CT GUIDED BONE MARROW ASPIRATES AND BIOPSY Physician: Stephan Minister. Anselm Pancoast, MD MEDICATIONS: None. ANESTHESIA/SEDATION: Fentanyl 100 mcg IV; Versed 4.0 mg IV Moderate Sedation Time:  20  minutes The patient was continuously monitored during the procedure by the interventional radiology nurse under my direct supervision. COMPLICATIONS: None immediate. PROCEDURE: The procedure was explained to the patient. The risks and benefits of the procedure were discussed and the patient's questions were addressed. Informed consent was obtained from the patient. The patient was placed prone on CT table. Images of the pelvis were obtained. The right side of back was prepped and draped in sterile fashion. The skin and right posterior ilium were anesthetized with 1% lidocaine. 11 gauge bone needle was directed into the right ilium with CT guidance. No aspirate material could be obtained. Therefore, 2 adequate sized core biopsies were obtained. Bandage placed over the puncture site. IMPRESSION: CT-guided bone marrow core biopsy. No aspirate material could be obtained. Electronically Signed   By: Markus Daft M.D.   On: 05/04/2019 13:16   Ir Kypho Lumbar Inc Fx Reduce Bone Bx Uni/bil Cannulation Inc/imaging  Result Date: 04/28/2019 CLINICAL DATA:  68 year old female with an acute, painful pathologic fracture at L2. MRI imaging demonstrates a diffuse infiltrative marrow process concerning for multiple myeloma or lymphoma. She presents for transpedicular L2 biopsy, radiofrequency ablation and cement augmentation with kyphoplasty. EXAM: FLUOROSCOPIC GUIDED L2 VERTEBRAL BODY BIOPSY RF ABLATION AND KYPHOPLASTY/CEMENT AUGMENTATION. COMPARISON:  MRI lumbar spine 04/15/2019 MEDICATIONS: Ancef 2 g IV; The antibiotic was administered in an appropriate time interval prior to needle puncture of the skin. ANESTHESIA/SEDATION: Versed 4 mg IV; Fentanyl 200 mcg IV Moderate Sedation Time: 52 minutes; The patient was continuously monitored during the procedure by the interventional radiology nurse under my direct supervision. FLUOROSCOPY TIME:  Fluoroscopy Time: 8 minutes 30 seconds (661 mGy). COMPLICATIONS: None immediate. TECHNIQUE:  Informed written consent was obtained from the patient after a thorough discussion of the procedural risks, benefits and alternatives. All questions were addressed. Maximal Sterile Barrier Technique was utilized including caps, mask, sterile gowns, sterile gloves, sterile drape, hand hygiene and skin antiseptic. A timeout was performed prior to the initiation of the procedure. The patient was placed prone on the fluoroscopic table. The skin overlying the lumbar region was then prepped and draped in the usual sterile fashion. Maximal barrier sterile technique was utilized including caps, mask, sterile gowns, sterile gloves, sterile drape, hand hygiene and skin antiseptic. Intravenous Fentanyl and Versed were administered as conscious sedation during continuous cardiorespiratory monitoring by the radiology RN. The left pedicle at L2 was then infiltrated with 1% lidocaine followed by the advancement of a Kyphon trocar needle through the left pedicle into the posterior one-third of the vertebral body. A biopsy specimen was then obtained. Subsequently, the osteo drill was advanced to the anterior third of the vertebral body. The osteo drill was retracted. Through the working cannula, a 10 mm OsteoCool RF  ablation probe was inserted and positioned under fluoroscopic guidance. In similar fashion, the right L2 pedicle was infiltrated with 1% lidocaine. Utilizing a extra pedicular approach, a second Kyphon trocar needle was advanced into the posterior third of the vertebral body. A biopsy specimen was then obtained. Subsequently, the osteo drill was coaxially advanced to the anterior right third. The osteo drill was exchanged for a 10 mm OsteoCool RF ablation probe which was positioned under fluoroscopic guidance. With both OsteoCool ablation probes in place, the ablation was performed for 15 minutes. Attention was now paid towards the kyphoplasty portion of the procedure. Beginning at the L2 vertebral body level, a Kyphon  inflatable bone tamp 15 x 2 was advanced through both working cannulas and positioned with the distal marker approximately 5 mm from the anterior aspect of the cortex. Appropriate positioning was confirmed on the AP projection. At this time, the balloon was expanded using contrast via a Kyphon inflation syringe device via micro tubing. Inflations were continued under direct fluoroscopic guidance. At this time, methylmethacrylate mixture was reconstituted in the Kyphon bone mixing device system. This was then loaded into the delivery mechanism, attached to Kyphon bone fillers. The balloons were deflated and removed followed by the instillation of methylmethacrylate mixture with excellent filling in the AP and lateral projections. The working cannulae and the bone filler were then retrieved and removed. Multiple spot radiographic images were obtained in various obliquities. Hemostasis was achieved with manual compression. The patient tolerated the procedure well without immediate postprocedural complication. FINDINGS: Completion images demonstrate a technically excellent result with adequate cement filling of the L2 vertebral bodies on both the AP and lateral projections. No extravasation was noted posteriorly into the spinal canal. No epidural venous contamination was seen. IMPRESSION: Technically successful L2 vertebral body biopsy, ablation and cement augmentation using balloon kyphoplasty. PLAN: The patient will be seen for clinical follow-up at the interventional radiology clinic in 2-4 weeks. Signed, Criselda Peaches, MD [facility] Electronically Signed   By: Jacqulynn Cadet M.D.   On: 04/28/2019 17:04    Orson Eva, DO  [facility] Pager [phone]  If 7PM-7AM, please contact night-coverage [email or web address] Password [email or web address] 05/27/2019, 7:05 PM   LOS: 3 days

## 2019-05-28 LAB — GLUCOSE, CAPILLARY
Glucose-Capillary: 91 mg/dL (ref 70–99)
Glucose-Capillary: 80 mg/dL (ref 70–99)
Glucose-Capillary: 99 mg/dL (ref 70–99)
Glucose-Capillary: 100 mg/dL — ABNORMAL HIGH (ref 70–99)
Glucose-Capillary: 129 mg/dL — ABNORMAL HIGH (ref 70–99)

## 2019-05-28 LAB — CBC WITH DIFFERENTIAL/PLATELET
Abs Immature Granulocytes: 0.21 10*3/uL — ABNORMAL HIGH (ref 0.00–0.07)
Basophils Absolute: 0 10*3/uL (ref 0.0–0.1)
Basophils Relative: 0 %
Eosinophils Absolute: 0.1 10*3/uL (ref 0.0–0.5)
Eosinophils Relative: 7 %
HCT: 19.9 % — ABNORMAL LOW (ref 36.0–46.0)
Hemoglobin: 6.6 g/dL — CL (ref 12.0–15.0)
Immature Granulocytes: 15 %
Lymphocytes Relative: 16 %
Lymphs Abs: 0.2 10*3/uL — ABNORMAL LOW (ref 0.7–4.0)
MCH: 33.3 pg (ref 26.0–34.0)
MCHC: 33.2 g/dL (ref 30.0–36.0)
MCV: 100.5 fL — ABNORMAL HIGH (ref 80.0–100.0)
Monocytes Absolute: 0.2 10*3/uL (ref 0.1–1.0)
Monocytes Relative: 12 %
Neutro Abs: 0.7 10*3/uL — ABNORMAL LOW (ref 1.7–7.7)
Neutrophils Relative %: 50 %
Platelets: 37 10*3/uL — ABNORMAL LOW (ref 150–400)
RBC: 1.98 MIL/uL — ABNORMAL LOW (ref 3.87–5.11)
RDW: 15.8 % — ABNORMAL HIGH (ref 11.5–15.5)
WBC: 1.4 10*3/uL — CL (ref 4.0–10.5)
nRBC: 0 % (ref 0.0–0.2)

## 2019-05-28 LAB — RENAL FUNCTION PANEL
Albumin: 2.4 g/dL — ABNORMAL LOW (ref 3.5–5.0)
Anion gap: 7 (ref 5–15)
BUN: 26 mg/dL — ABNORMAL HIGH (ref 8–23)
CO2: 20 mmol/L — ABNORMAL LOW (ref 22–32)
Calcium: 7.9 mg/dL — ABNORMAL LOW (ref 8.9–10.3)
Chloride: 113 mmol/L — ABNORMAL HIGH (ref 98–111)
Creatinine, Ser: 4.31 mg/dL — ABNORMAL HIGH (ref 0.44–1.00)
GFR calc Af Amer: 11 mL/min — ABNORMAL LOW (ref >60)
GFR calc non Af Amer: 10 mL/min — ABNORMAL LOW (ref >60)
Glucose, Bld: 93 mg/dL (ref 70–99)
Phosphorus: 4.4 mg/dL (ref 2.5–4.6)
Potassium: 5 mmol/L (ref 3.5–5.1)
Sodium: 140 mmol/L (ref 135–145)

## 2019-05-28 LAB — HEMOGLOBIN A1C
Hgb A1c MFr Bld: 8.5 % — ABNORMAL HIGH (ref 4.8–5.6)
Mean Plasma Glucose: 197 mg/dL

## 2019-05-28 LAB — PREPARE RBC (CROSSMATCH)

## 2019-05-28 LAB — ABO/RH: ABO/RH(D): O POS

## 2019-05-28 LAB — VANCOMYCIN, RANDOM: Vancomycin Rm: 20

## 2019-05-28 MED ORDER — CLOTRIMAZOLE 1 % VA CREA
1.0000 | TOPICAL_CREAM | Freq: Every day | VAGINAL | Status: DC
  Administered 2019-05-28 – 2019-05-31 (×4): 1 via VAGINAL
  Filled 2019-05-28 (×2): qty 45

## 2019-05-28 MED ORDER — SODIUM CHLORIDE 0.9% IV SOLUTION: Freq: Once | INTRAVENOUS | Status: DC

## 2019-05-28 MED ORDER — STERILE WATER FOR INJECTION IV SOLN
INTRAVENOUS | Status: AC
  Filled 2019-05-28 (×2): qty 850

## 2019-05-28 MED ORDER — CHLORHEXIDINE GLUCONATE 4 % EX LIQD
Freq: Every day | CUTANEOUS | Status: DC | PRN
  Filled 2019-05-28: qty 15

## 2019-05-28 NOTE — Progress Notes (Signed)
PROGRESS NOTE  Vicki Moon CHJ:[id number] DOB: Feb 07, 1951 DOA: 05/24/2019 PCP: [name], MD  Brief History: 68 year old female with a history of diabetes mellitus, myeloma, and MRSA skin infections presenting with cellulitis and increasing number of furuncles. The patient went to see her oncologist, Dr. [name] 05/24/2019. She was scheduled to receive her dose of Velcade. However, she was noted to have increasing number of furuncles on her skin. As result, her Velcade was held. It was also held in part due to the patient's pancytopenia. Because of increasing number of furuncles, the patient was recommended to go to the emergency department for further evaluation and intravenous antibiotics. Upon presentation, the patient had temperature 100.0 F but was hemodynamically stable. WBC was the 1.1. The patient was started on vancomycin IV.  Unfortunately, she developed AKI.  Nephrology was consulted to assist.  Assessment/Plan: Cellulitis right groin/furunculosis -discontinue vancomycin IVdue to renal failure -Continue mupirocin nasal therapy for decolonization -Chlorhexidine baths -improving -vanc trough still therapeutic  AKI -likely due to hemodynamic changes in the setting of lisinopril -baseline creatinine 0.9-1.1 -serum creatininecontinues to worsen -d/c lisinopril -renal us--neg hydronephrosis -6/6--discussed with renal, Dr. Enis Gash creatinine appears to be reaching plateau -continue IVF--pt continues to have poor po intake  IgG kappa myeloma -Patient followed by Dr. [name] -Started on Revlimid 05/09/2019 -05/12/2019. PET scan showed patchy areas of hypermetabolism throughout the osseous skeleton -05/04/19 BM biopsy--showed marked increase in plasma cells. I am no histochemistry stain, she had 90% plasma cells. -received Xgeva 06/19/19  Pancytopenia -likely due to Revlimid--stopped 6/2 by Dr. [name] -continue Granix--started 6/4  -6/6--transfuse 2 units PRBCs  Diabetes mellitus type 2, uncontrolled with hyperglycemia -12/18/2018--hemoglobin A1c 8.1 -Continue Lantus -Continue NovoLog sliding scale -6/3 A1C--8.4  Hyperlipidemia -Continue statin  Essential hypertension -Holding lisinopril secondary to acute kidney injuryand soft BPs      Disposition Plan: Home in 2-3days when renal function improves Family Communication:NoFamily at bedside  Consultants:none  Code Status: FULL  DVT Prophylaxis: SCDS   Procedures: As Listed in Progress Note Above  Antibiotics: vanco6/2>>>     Subjective: Pt states food smells bad.  She has no apetite.  Patient denies fevers, chills, headache, chest pain, dyspnea, nausea, vomiting, diarrhea, abdominal pain, dysuria, hematuria, hematochezia, and melena.   Objective: Vitals:   05/27/19 2134 05/28/19 0540 05/28/19 1602 05/28/19 1635  BP: (!) 108/46 (!) 118/45 103/60 (!) (P) 114/51  Pulse: 82 74 (!) 59 (P) 66  Resp: 20 20 18    Temp: 98.4 F (36.9 C) 98.2 F (36.8 C) 97.7 F (36.5 C) (P) 98.3 F (36.8 C)  TempSrc: Oral Oral Oral (P) Oral  SpO2: 95% 93% 98% (P) 98%  Weight:  94.6 kg    Height:        Intake/Output Summary (Last 24 hours) at 05/28/2019 1723 Last data filed at 05/28/2019 1500 Gross per 24 hour  Intake 2316.69 ml  Output 1750 ml  Net 566.69 ml   Weight change:  Exam:   General:  Pt is alert, follows commands appropriately, not in acute distress  HEENT: No icterus, No thrush, No neck mass, [place]/AT  Cardiovascular: RRR, S1/S2, no rubs, no gallops  Respiratory: fine bibasilar rales, no wheeze  Abdomen: Soft/+BS, non tender, non distended, no guarding  Extremities: No edema, No lymphangitis, No petechiae, No rashes, no synovitis   Data Reviewed: I have personally reviewed following labs and imaging studies Basic Metabolic Panel: Recent Labs  Lab 05/24/19 0856 05/24/19  1519 05/25/19 0443 05/26/19 0454  05/27/19 0511 05/28/19 0656  NA 138  --  136 136 141 140  K 3.2*  --  4.1 4.0 4.8 5.0  CL 103  --  106 109 114* 113*  CO2 27  --  22 20* 21* 20*  GLUCOSE 276*  --  162* 123* 115* 93  BUN 12  --  16 24* 29* 26*  CREATININE 1.17*  --  2.17* 3.86* 4.22* 4.31*  CALCIUM 7.8*  --  7.7* 7.5* 8.0* 7.9*  MG  --  1.7 1.7  --   --   --   PHOS  --   --   --   --   --  4.4   Liver Function Tests: Recent Labs  Lab 05/24/19 0856 05/25/19 0443 05/26/19 0454 05/27/19 0511 05/28/19 0656  AST 14* 25 19 17   --   ALT 23 36 37 35  --   ALKPHOS 100 92 101 110  --   BILITOT 0.6 0.7 0.7 0.7  --   PROT 6.9 5.9* 5.9* 6.0*  --   ALBUMIN 3.4* 2.5* 2.4* 2.4* 2.4*   No results for input(s): LIPASE, AMYLASE in the last 168 hours. No results for input(s): AMMONIA in the last 168 hours. Coagulation Profile: No results for input(s): INR, PROTIME in the last 168 hours. CBC: Recent Labs  Lab 05/24/19 0856 05/25/19 0443 05/26/19 0454 05/27/19 0955 05/28/19 0656  WBC 1.1* 1.0* 0.7* 1.6* 1.4*  NEUTROABS 0.6* 0.5* 0.4* 0.8* 0.7*  HGB 8.0* 7.5* 7.1* 7.1* 6.6*  HCT 23.9* 23.6* 21.9* 22.3* 19.9*  MCV 96.8 99.6 99.5 102.3* 100.5*  PLT 43* 38* 39* 38* 37*   Cardiac Enzymes: Recent Labs  Lab 05/26/19 0454  CKTOTAL 137   BNP: Invalid input(s): POCBNP CBG: Recent Labs  Lab 05/27/19 2204 05/28/19 0321 05/28/19 0723 05/28/19 1114 05/28/19 1601  GLUCAP 127* 91 80 99 100*   HbA1C: Recent Labs    05/26/19 0452  HGBA1C 8.5*   Urine analysis:    Component Value Date/Time   COLORURINE STRAW (A) 05/26/2019 0905   APPEARANCEUR HAZY (A) 05/26/2019 0905   LABSPEC 1.008 05/26/2019 0905   PHURINE 6.0 05/26/2019 0905   GLUCOSEU NEGATIVE 05/26/2019 0905   HGBUR NEGATIVE 05/26/2019 0905   BILIRUBINUR NEGATIVE 05/26/2019 0905   KETONESUR NEGATIVE 05/26/2019 0905   PROTEINUR NEGATIVE 05/26/2019 0905   UROBILINOGEN 0.2 07/11/2014 1440   NITRITE NEGATIVE 05/26/2019 0905   LEUKOCYTESUR NEGATIVE  05/26/2019 0905   Sepsis Labs: @LABRCNTIP (procalcitonin:4,lacticidven:4) ) Recent Results (from the past 240 hour(s))  SARS Coronavirus 2 (CEPHEID - Performed in [place] hospital lab), Hosp Order     Status: None   Collection Time: 05/24/19  2:19 PM  Result Value Ref Range Status   SARS Coronavirus 2 NEGATIVE NEGATIVE Final    Comment: (NOTE) If result is NEGATIVE SARS-CoV-2 target nucleic acids are NOT DETECTED. The SARS-CoV-2 RNA is generally detectable in upper and lower  respiratory specimens during the acute phase of infection. The lowest  concentration of SARS-CoV-2 viral copies this assay can detect is 250  copies / mL. A negative result does not preclude SARS-CoV-2 infection  and should not be used as the sole basis for treatment or other  patient management decisions.  A negative result may occur with  improper specimen collection / handling, submission of specimen other  than nasopharyngeal swab, presence of viral mutation(s) within the  areas targeted by this assay, and inadequate number of viral copies  (<  250 copies / mL). A negative result must be combined with clinical  observations, patient history, and epidemiological information. If result is POSITIVE SARS-CoV-2 target nucleic acids are DETECTED. The SARS-CoV-2 RNA is generally detectable in upper and lower  respiratory specimens dur ing the acute phase of infection.  Positive  results are indicative of active infection with SARS-CoV-2.  Clinical  correlation with patient history and other diagnostic information is  necessary to determine patient infection status.  Positive results do  not rule out bacterial infection or co-infection with other viruses. If result is PRESUMPTIVE POSTIVE SARS-CoV-2 nucleic acids MAY BE PRESENT.   A presumptive positive result was obtained on the submitted specimen  and confirmed on repeat testing.  While 2019 novel coronavirus  (SARS-CoV-2) nucleic acids may be present in the  submitted sample  additional confirmatory testing may be necessary for epidemiological  and / or clinical management purposes  to differentiate between  SARS-CoV-2 and other Sarbecovirus currently known to infect humans.  If clinically indicated additional testing with an alternate test  methodology [phone] is advised. The SARS-CoV-2 RNA is generally  detectable in upper and lower respiratory sp ecimens during the acute  phase of infection. The expected result is Negative. Fact Sheet for Patients:  StrictlyIdeas.no Fact Sheet for Healthcare Providers: BankingDealers.co.za This test is not yet approved or cleared by the [place] FDA and has been authorized for detection and/or diagnosis of SARS-CoV-2 by FDA under an Emergency Use Authorization (EUA).  This EUA will remain in effect (meaning this test can be used) for the duration of the COVID-19 declaration under Section 564(b)(1) of the Act, 21 U.S.C. section 360bbb-3(b)(1), unless the authorization is terminated or revoked sooner. Performed at [facility], [address] [place], [place] [address]   Culture, blood (Routine X 2) w Reflex to ID Panel     Status: None (Preliminary result)   Collection Time: 05/24/19  3:19 PM  Result Value Ref Range Status   Specimen Description BLOOD RIGHT ANTECUBITAL  Final   Special Requests   Final    BOTTLES DRAWN AEROBIC AND ANAEROBIC Blood Culture adequate volume   Culture   Final    NO GROWTH 4 DAYS Performed at [facility] [address] [place], [place] [address]    Report Status PENDING  Incomplete  Culture, blood (Routine X 2) w Reflex to ID Panel     Status: None (Preliminary result)   Collection Time: 05/24/19  3:19 PM  Result Value Ref Range Status   Specimen Description BLOOD BLOOD RIGHT WRIST  Final   Special Requests   Final    BOTTLES DRAWN AEROBIC AND ANAEROBIC Blood Culture adequate volume   Culture   Final    NO GROWTH  4 DAYS Performed at [facility], [address], [place], [place] [address]    Report Status PENDING  Incomplete     Scheduled Meds: . sodium chloride   Intravenous Once  . aspirin  325 mg Oral Daily  . atorvastatin  10 mg Oral q1800  . famciclovir  250 mg Oral Daily  . filgrastim (NEUPOGEN)  SQ  480 mcg Subcutaneous Daily  . insulin aspart  0-15 Units Subcutaneous TID WC  . insulin aspart  0-5 Units Subcutaneous QHS  . insulin glargine  25 Units Subcutaneous QHS  . latanoprost  1 drop Both Eyes QHS  . mupirocin cream   Topical BID  . mupirocin ointment   Nasal BID  . PARoxetine  30 mg Oral  q morning - 10a   Continuous Infusions: .  sodium bicarbonate (isotonic) infusion in sterile water      Procedures/Studies: US Renal  Result Date: 05/25/2019 CLINICAL DATA:  Acute kidney injury. EXAM: RENAL / URINARY TRACT ULTRASOUND COMPLETE COMPARISON:  Ultrasound of December 18, 2018. FINDINGS: Right Kidney: Renal measurements: 15.1 x 7.7 x 6.3 cm = volume: 380 mL . Echogenicity within normal limits. No mass or hydronephrosis visualized. Left Kidney: Renal measurements: 12.6 x 6.5 x 6.4 cm = volume: 270 mL. Echogenicity within normal limits. No mass or hydronephrosis visualized. Bladder: Appears normal for degree of bladder distention. Ureteral jets are not visualized. IMPRESSION: No significant renal abnormality is noted. Electronically Signed   By: Marijo Conception M.D.   On: 05/25/2019 16:04   Nm Pet Image Initial (pi) Whole Body  Result Date: 05/12/2019 CLINICAL DATA:  Initial treatment strategy for multiple myeloma. EXAM: NUCLEAR MEDICINE PET WHOLE BODY TECHNIQUE: 10.0 mCi F-18 FDG was injected intravenously. Full-ring PET imaging was performed from the skull base to thigh after the radiotracer. CT data was obtained and used for attenuation correction and anatomic localization. Fasting blood glucose: 198 mg/dl COMPARISON:  CT chest abdomen pelvis 04/25/2019. FINDINGS: Mediastinal blood pool  activity: SUV max 3.9 HEAD/NECK: Mildly asymmetric uptake within the left nasopharynx, without a CT correlate. Otherwise, abnormal hypermetabolism in the head/neck. Incidental CT findings: None. CHEST: No hypermetabolic mediastinal, hilar or axillary lymph nodes. No hypermetabolic pulmonary nodules. Incidental CT findings: Atherosclerotic calcification of the aorta and coronary arteries. Heart is enlarged. No pericardial or pleural effusion. There may be a few scattered millimetric pulmonary nodules which are distorted due to respiratory motion and expiratory phase imaging. ABDOMEN/PELVIS: No abnormal hypermetabolism in the liver, adrenal glands, spleen or pancreas. No hypermetabolic pathologically enlarged lymph nodes. Incidental CT findings: Liver, adrenal glands and right kidney are unremarkable. Tiny stone in the lower pole left kidney. Spleen, pancreas, stomach and bowel are otherwise unremarkable. Cholecystectomy. Uterus is visualized. There may be bladder wall thickening but the bladder is poorly distended. SKELETON: Focal uptake in anterior right ribs are associated with probable old fractures. Mild hypermetabolism associated with an L2 vertebral body augmentation. Incidental CT findings: L2 vertebral body augmentation. A 12 mm lytic lesion in the left aspect of S1 (series 4, image 181) is again seen and without definitive hypermetabolism. Subtle lysis within the inferior endplate of L5 is better seen on the comparison exam. EXTREMITIES: No abnormal hypermetabolism. Incidental CT findings: There is focal atrophy of the right calf musculature. IMPRESSION: 1. Patchy hypermetabolism throughout the visualized osseous structures. L5 and S1 lucencies do not appear focally hypermetabolic. 2. Left renal stone. 3. Aortic atherosclerosis (ICD10-170.0). Coronary artery calcification. Electronically Signed   By: Lorin Picket M.D.   On: 05/12/2019 09:10   Ct Biopsy  Result Date: 05/04/2019 INDICATION: 68 year old  with multiple myeloma.  Request for bone marrow biopsy. EXAM: CT GUIDED BONE MARROW ASPIRATES AND BIOPSY Physician: Stephan Minister. Anselm Pancoast, MD MEDICATIONS: None. ANESTHESIA/SEDATION: Fentanyl 100 mcg IV; Versed 4.0 mg IV Moderate Sedation Time:  20 minutes The patient was continuously monitored during the procedure by the interventional radiology nurse under my direct supervision. COMPLICATIONS: None immediate. PROCEDURE: The procedure was explained to the patient. The risks and benefits of the procedure were discussed and the patient's questions were addressed. Informed consent was obtained from the patient. The patient was placed prone on CT table. Images of the pelvis were obtained. The right side of back was prepped and draped in sterile  fashion. The skin and right posterior ilium were anesthetized with 1% lidocaine. 11 gauge bone needle was directed into the right ilium with CT guidance. No aspirate material could be obtained. Therefore, 2 adequate sized core biopsies were obtained. Bandage placed over the puncture site. IMPRESSION: CT-guided bone marrow core biopsy. No aspirate material could be obtained. Electronically Signed   By: Markus Daft M.D.   On: 05/04/2019 13:16   Ct Bone Marrow Biopsy & Aspiration  Result Date: 05/04/2019 INDICATION: 68 year old with multiple myeloma.  Request for bone marrow biopsy. EXAM: CT GUIDED BONE MARROW ASPIRATES AND BIOPSY Physician: Stephan Minister. Anselm Pancoast, MD MEDICATIONS: None. ANESTHESIA/SEDATION: Fentanyl 100 mcg IV; Versed 4.0 mg IV Moderate Sedation Time:  20 minutes The patient was continuously monitored during the procedure by the interventional radiology nurse under my direct supervision. COMPLICATIONS: None immediate. PROCEDURE: The procedure was explained to the patient. The risks and benefits of the procedure were discussed and the patient's questions were addressed. Informed consent was obtained from the patient. The patient was placed prone on CT table. Images of the pelvis  were obtained. The right side of back was prepped and draped in sterile fashion. The skin and right posterior ilium were anesthetized with 1% lidocaine. 11 gauge bone needle was directed into the right ilium with CT guidance. No aspirate material could be obtained. Therefore, 2 adequate sized core biopsies were obtained. Bandage placed over the puncture site. IMPRESSION: CT-guided bone marrow core biopsy. No aspirate material could be obtained. Electronically Signed   By: Markus Daft M.D.   On: 05/04/2019 13:16    Orson Eva, DO  [facility] Pager [phone]  If 7PM-7AM, please contact night-coverage [email or web address] Password [email or web address] 05/28/2019, 5:23 PM   LOS: 4 days

## 2019-05-28 NOTE — Progress Notes (Signed)
Patient ID: Regan Rakers, female   DOB: [birth year], 68 y.o.   MRN: [id number] [facility] Progress Note   Assessment/ Plan:   1. Acute kidney Injury: With excellent urine output overnight and just slight rise of creatinine which reflects that she is possibly approaching the plateau phase of ATN (possibly mediated hemodynamically in the setting of recent ACE inhibitor use/hypotension/hyperglycemia with possible contributory injury from vancomycin).  Mild hyperkalemia with metabolic acidosis noted, will switch IV fluids to isotonic sodium bicarbonate. 2.  Metabolic acidosis: Non-anion gap.  Begin isotonic sodium bicarbonate. 3.  IgG kappa light chain multiple myeloma: Recently on chemotherapy with Revlimid and Xgeva.  Therapy currently on hold because of pancytopenia. 4.  Pancytopenia: Iatrogenic from Revlimid used for treatment of her multiple myeloma, currently chemotherapy on hold and on Neupogen.  On prophylactic famciclovir and plans for 2 units of PRBC transfusion later today noted. 5.  Furunculosis/cellulitis of right groin: Previously on vancomycin that has since been discontinued, slightly elevated trough level of 28 noted with acceptable random level of 20.  If antibiotics are required, continue to dose for GFR of 10 mL/minute with choices such as cephalosporins or doxycycline.  Subjective:   Reports to be feeling somewhat better, denies any chest pain or shortness of breath.   Objective:   BP (!) 118/45 (BP Location: Left Arm)   Pulse 74   Temp 98.2 F (36.8 C) (Oral)   Resp 20   Ht 5' 2"  (1.575 m)   Wt 94.6 kg   SpO2 93%   BMI 38.15 kg/m   Intake/Output Summary (Last 24 hours) at 05/28/2019 1347 Last data filed at 05/28/2019 0900 Gross per 24 hour  Intake 1836.69 ml  Output 2200 ml  Net -363.31 ml   Weight change:   Physical Exam: Gen: Comfortably resting in bed, just returned from using the restroom CVS: Pulse regular rhythm, normal rate, S1 and S2  normal Resp: Clear to auscultation, no rales/rhonchi Abd: Soft, obese, nontender Ext: No lower extremity edema  Imaging: No results found.  Labs: BMET Recent Labs  Lab 05/24/19 0856 05/25/19 0443 05/26/19 0454 05/27/19 0511 05/28/19 0656  NA 138 136 136 141 140  K 3.2* 4.1 4.0 4.8 5.0  CL 103 106 109 114* 113*  CO2 27 22 20* 21* 20*  GLUCOSE 276* 162* 123* 115* 93  BUN 12 16 24* 29* 26*  CREATININE 1.17* 2.17* 3.86* 4.22* 4.31*  CALCIUM 7.8* 7.7* 7.5* 8.0* 7.9*  PHOS  --   --   --   --  4.4   CBC Recent Labs  Lab 05/25/19 0443 05/26/19 0454 05/27/19 0955 05/28/19 0656  WBC 1.0* 0.7* 1.6* 1.4*  NEUTROABS 0.5* 0.4* 0.8* 0.7*  HGB 7.5* 7.1* 7.1* 6.6*  HCT 23.6* 21.9* 22.3* 19.9*  MCV 99.6 99.5 102.3* 100.5*  PLT 38* 39* 38* 37*   Medications:    . sodium chloride   Intravenous Once  . aspirin  325 mg Oral Daily  . atorvastatin  10 mg Oral q1800  . famciclovir  250 mg Oral Daily  . filgrastim (NEUPOGEN)  SQ  480 mcg Subcutaneous Daily  . insulin aspart  0-15 Units Subcutaneous TID WC  . insulin aspart  0-5 Units Subcutaneous QHS  . insulin glargine  25 Units Subcutaneous QHS  . latanoprost  1 drop Both Eyes QHS  . mupirocin cream   Topical BID  . mupirocin ointment   Nasal BID  . PARoxetine  30 mg Oral q morning -  10a   Elmarie Shiley, MD 05/28/2019, 1:47 PM

## 2019-05-29 LAB — TYPE AND SCREEN
ABO/RH(D): O POS
Antibody Screen: NEGATIVE
Unit division: 0
Unit division: 0

## 2019-05-29 LAB — RENAL FUNCTION PANEL
Albumin: 2.5 g/dL — ABNORMAL LOW (ref 3.5–5.0)
Anion gap: 6 (ref 5–15)
BUN: 24 mg/dL — ABNORMAL HIGH (ref 8–23)
CO2: 21 mmol/L — ABNORMAL LOW (ref 22–32)
Calcium: 8.1 mg/dL — ABNORMAL LOW (ref 8.9–10.3)
Chloride: 115 mmol/L — ABNORMAL HIGH (ref 98–111)
Creatinine, Ser: 3.97 mg/dL — ABNORMAL HIGH (ref 0.44–1.00)
GFR calc Af Amer: 12 mL/min — ABNORMAL LOW (ref >60)
GFR calc non Af Amer: 11 mL/min — ABNORMAL LOW (ref >60)
Glucose, Bld: 94 mg/dL (ref 70–99)
Phosphorus: 4.5 mg/dL (ref 2.5–4.6)
Potassium: 4.5 mmol/L (ref 3.5–5.1)
Sodium: 142 mmol/L (ref 135–145)

## 2019-05-29 LAB — BPAM RBC
Blood Product Expiration Date: 202007142359
Blood Product Expiration Date: 202007142359
ISSUE DATE / TIME: 202006061607
ISSUE DATE / TIME: 202006061842
Unit Type and Rh: 5100
Unit Type and Rh: 5100

## 2019-05-29 LAB — CBC WITH DIFFERENTIAL/PLATELET
Abs Immature Granulocytes: 0.31 10*3/uL — ABNORMAL HIGH (ref 0.00–0.07)
Basophils Absolute: 0 10*3/uL (ref 0.0–0.1)
Basophils Relative: 0 %
Eosinophils Absolute: 0.1 10*3/uL (ref 0.0–0.5)
Eosinophils Relative: 3 %
HCT: 27.5 % — ABNORMAL LOW (ref 36.0–46.0)
Hemoglobin: 8.7 g/dL — ABNORMAL LOW (ref 12.0–15.0)
Immature Granulocytes: 11 %
Lymphocytes Relative: 11 %
Lymphs Abs: 0.3 10*3/uL — ABNORMAL LOW (ref 0.7–4.0)
MCH: 31.2 pg (ref 26.0–34.0)
MCHC: 31.6 g/dL (ref 30.0–36.0)
MCV: 98.6 fL (ref 80.0–100.0)
Monocytes Absolute: 0.4 10*3/uL (ref 0.1–1.0)
Monocytes Relative: 12 %
Neutro Abs: 1.8 10*3/uL (ref 1.7–7.7)
Neutrophils Relative %: 63 %
Platelets: 40 10*3/uL — ABNORMAL LOW (ref 150–400)
RBC: 2.79 MIL/uL — ABNORMAL LOW (ref 3.87–5.11)
RDW: 14.9 % (ref 11.5–15.5)
WBC: 2.9 10*3/uL — ABNORMAL LOW (ref 4.0–10.5)
nRBC: 0 % (ref 0.0–0.2)

## 2019-05-29 LAB — GLUCOSE, CAPILLARY
Glucose-Capillary: 98 mg/dL (ref 70–99)
Glucose-Capillary: 85 mg/dL (ref 70–99)
Glucose-Capillary: 117 mg/dL — ABNORMAL HIGH (ref 70–99)
Glucose-Capillary: 144 mg/dL — ABNORMAL HIGH (ref 70–99)
Glucose-Capillary: 133 mg/dL — ABNORMAL HIGH (ref 70–99)

## 2019-05-29 LAB — CULTURE, BLOOD (ROUTINE X 2)
Culture: NO GROWTH
Culture: NO GROWTH
Special Requests: ADEQUATE
Special Requests: ADEQUATE

## 2019-05-29 LAB — VANCOMYCIN, RANDOM: Vancomycin Rm: 15

## 2019-05-29 MED ORDER — DOXYCYCLINE HYCLATE 100 MG PO TABS
100.0000 mg | ORAL_TABLET | Freq: Two times a day (BID) | ORAL | Status: AC
  Administered 2019-05-29 – 2019-06-01 (×6): 100 mg via ORAL
  Filled 2019-05-29 (×6): qty 1

## 2019-05-29 MED ORDER — ALUM & MAG HYDROXIDE-SIMETH 200-200-20 MG/5ML PO SUSP: 30.0000 mL | Freq: Four times a day (QID) | ORAL | Status: DC | PRN

## 2019-05-29 MED ORDER — INSULIN GLARGINE 100 UNIT/ML ~~LOC~~ SOLN
20.0000 [IU] | Freq: Every day | SUBCUTANEOUS | Status: DC
  Administered 2019-05-29 – 2019-05-31 (×3): 20 [IU] via SUBCUTANEOUS
  Filled 2019-05-29 (×4): qty 0.2

## 2019-05-29 NOTE — Progress Notes (Signed)
PROGRESS NOTE  Vicki Moon NXG:[id number] DOB: Dec 08, 1951 DOA: 05/24/2019 PCP: [name], MD  Brief History: 68 year old female with a history of diabetes mellitus, myeloma, and MRSA skin infections presenting with cellulitis and increasing number of furuncles. The patient went to see her oncologist, Dr. [name] 05/24/2019. She was scheduled to receive her dose of Velcade. However, she was noted to have increasing number of furuncles on her skin. As result, her Velcade was held. It was also held in part due to the patient's pancytopenia. Because of increasing number of furuncles, the patient was recommended to go to the emergency department for further evaluation and intravenous antibiotics. Upon presentation, the patient had temperature 100.0 F but was hemodynamically stable. WBC was the 1.1. The patient was started on vancomycin IV.  Unfortunately, she developed AKI.  Nephrology was consulted to assist.  Assessment/Plan: Cellulitis right groin/furunculosis -discontinue vancomycin IVdue to renal failure -Continue mupirocin nasal therapy for decolonization -Chlorhexidine baths -improving -vanc trough still therapeutic -as wound is improving, plan to start doxy   AKI -likely due to hemodynamic changes in the setting of lisinopril -baseline creatinine 0.9-1.1 -serum creatininecontinues to worsen -d/c lisinopril -renal us--neg hydronephrosis -05/29/19--renal function starting to improve -continue IVF--pt continues to have poor po intake  IgG kappa myeloma -Patient followed by Dr. [name] -Started on Revlimid 05/09/2019 -05/12/2019. PET scan showed patchy areas of hypermetabolism throughout the osseous skeleton -05/04/19 BM biopsy--showed marked increase in plasma cells. I am no histochemistry stain, she had 90% plasma cells. -received Xgeva 06/19/19  Pancytopenia -likely due to Revlimid--stopped 6/2 by Dr. [name] -continueGranix--started 6/4  -6/6--transfused 2 units PRBCs  Diabetes mellitus type 2, uncontrolled with hyperglycemia -12/18/2018--hemoglobin A1c 8.1 -Continue Lantus -Continue NovoLog sliding scale -6/3A1C--8.4  Hyperlipidemia -Continue statin  Essential hypertension -Holding lisinopril secondary to acute kidney injuryand soft BPs   Disposition Plan: Home in 1-2 days if renal function continues to improve Family Communication:NoFamily at bedside  Consultants:none  Code Status: FULL  DVT Prophylaxis: SCDS   Procedures: As Listed in Progress Note Above  Antibiotics: vanco6/2>>> Doxy 6/7>>>     Subjective: Right groin is improving.  She denies f/c, cp, sob, abd pain, n/v/d.  She feels like she has more energy today after PRBC  Objective: Vitals:   05/28/19 2104 05/28/19 2225 05/29/19 0557 05/29/19 0559  BP: (!) 135/50 (!) 126/45 (!) 122/42 (!) 135/53  Pulse: 67 63 (!) 59 62  Resp: 18 18 16    Temp: 98.2 F (36.8 C) 98.1 F (36.7 C) 98.3 F (36.8 C)   TempSrc: Oral Oral Oral   SpO2: 97% 98% 97%   Weight:   94.1 kg   Height:        Intake/Output Summary (Last 24 hours) at 05/29/2019 1208 Last data filed at 05/29/2019 0900 Gross per 24 hour  Intake 2186 ml  Output 400 ml  Net 1786 ml   Weight change: -0.5 kg Exam:   General:  Pt is alert, follows commands appropriately, not in acute distress  HEENT: No icterus, No thrush, No neck mass, /AT  Cardiovascular: RRR, S1/S2, no rubs, no gallops  Respiratory: CTA bilaterally, no wheezing, no crackles, no rhonchi  Abdomen: Soft/+BS, non tender, non distended, no guarding  Extremities: No edema, No lymphangitis, No petechiae, No rashes, no synovitis   Data Reviewed: I have personally reviewed following labs and imaging studies Basic Metabolic Panel: Recent Labs  Lab 05/24/19 1519 05/25/19 0443 05/26/19 0454 05/27/19 0511 05/28/19  1115 05/29/19 0718  NA  --  136 136 141 140 142  K  --  4.1 4.0 4.8  5.0 4.5  CL  --  106 109 114* 113* 115*  CO2  --  22 20* 21* 20* 21*  GLUCOSE  --  162* 123* 115* 93 94  BUN  --  16 24* 29* 26* 24*  CREATININE  --  2.17* 3.86* 4.22* 4.31* 3.97*  CALCIUM  --  7.7* 7.5* 8.0* 7.9* 8.1*  MG 1.7 1.7  --   --   --   --   PHOS  --   --   --   --  4.4 4.5   Liver Function Tests: Recent Labs  Lab 05/24/19 0856 05/25/19 0443 05/26/19 0454 05/27/19 0511 05/28/19 0656 05/29/19 0718  AST 14* 25 19 17   --   --   ALT 23 36 37 35  --   --   ALKPHOS 100 92 101 110  --   --   BILITOT 0.6 0.7 0.7 0.7  --   --   PROT 6.9 5.9* 5.9* 6.0*  --   --   ALBUMIN 3.4* 2.5* 2.4* 2.4* 2.4* 2.5*   No results for input(s): LIPASE, AMYLASE in the last 168 hours. No results for input(s): AMMONIA in the last 168 hours. Coagulation Profile: No results for input(s): INR, PROTIME in the last 168 hours. CBC: Recent Labs  Lab 05/25/19 0443 05/26/19 0454 05/27/19 0955 05/28/19 0656 05/29/19 0718  WBC 1.0* 0.7* 1.6* 1.4* 2.9*  NEUTROABS 0.5* 0.4* 0.8* 0.7* 1.8  HGB 7.5* 7.1* 7.1* 6.6* 8.7*  HCT 23.6* 21.9* 22.3* 19.9* 27.5*  MCV 99.6 99.5 102.3* 100.5* 98.6  PLT 38* 39* 38* 37* 40*   Cardiac Enzymes: Recent Labs  Lab 05/26/19 0454  CKTOTAL 137   BNP: Invalid input(s): POCBNP CBG: Recent Labs  Lab 05/28/19 1601 05/28/19 2102 05/29/19 0220 05/29/19 0726 05/29/19 1118  GLUCAP 100* 129* 98 85 117*   HbA1C: No results for input(s): HGBA1C in the last 72 hours. Urine analysis:    Component Value Date/Time   COLORURINE STRAW (A) 05/26/2019 0905   APPEARANCEUR HAZY (A) 05/26/2019 0905   LABSPEC 1.008 05/26/2019 0905   PHURINE 6.0 05/26/2019 0905   GLUCOSEU NEGATIVE 05/26/2019 0905   HGBUR NEGATIVE 05/26/2019 0905   BILIRUBINUR NEGATIVE 05/26/2019 0905   KETONESUR NEGATIVE 05/26/2019 0905   PROTEINUR NEGATIVE 05/26/2019 0905   UROBILINOGEN 0.2 07/11/2014 1440   NITRITE NEGATIVE 05/26/2019 0905   LEUKOCYTESUR NEGATIVE 05/26/2019 0905   Sepsis Labs:  @LABRCNTIP (procalcitonin:4,lacticidven:4) ) Recent Results (from the past 240 hour(s))  SARS Coronavirus 2 (CEPHEID - Performed in [place] hospital lab), Hosp Order     Status: None   Collection Time: 05/24/19  2:19 PM  Result Value Ref Range Status   SARS Coronavirus 2 NEGATIVE NEGATIVE Final    Comment: (NOTE) If result is NEGATIVE SARS-CoV-2 target nucleic acids are NOT DETECTED. The SARS-CoV-2 RNA is generally detectable in upper and lower  respiratory specimens during the acute phase of infection. The lowest  concentration of SARS-CoV-2 viral copies this assay can detect is 250  copies / mL. A negative result does not preclude SARS-CoV-2 infection  and should not be used as the sole basis for treatment or other  patient management decisions.  A negative result may occur with  improper specimen collection / handling, submission of specimen other  than nasopharyngeal swab, presence of viral mutation(s) within the  areas targeted by  this assay, and inadequate number of viral copies  (<250 copies / mL). A negative result must be combined with clinical  observations, patient history, and epidemiological information. If result is POSITIVE SARS-CoV-2 target nucleic acids are DETECTED. The SARS-CoV-2 RNA is generally detectable in upper and lower  respiratory specimens dur ing the acute phase of infection.  Positive  results are indicative of active infection with SARS-CoV-2.  Clinical  correlation with patient history and other diagnostic information is  necessary to determine patient infection status.  Positive results do  not rule out bacterial infection or co-infection with other viruses. If result is PRESUMPTIVE POSTIVE SARS-CoV-2 nucleic acids MAY BE PRESENT.   A presumptive positive result was obtained on the submitted specimen  and confirmed on repeat testing.  While 2019 novel coronavirus  (SARS-CoV-2) nucleic acids may be present in the submitted sample  additional  confirmatory testing may be necessary for epidemiological  and / or clinical management purposes  to differentiate between  SARS-CoV-2 and other Sarbecovirus currently known to infect humans.  If clinically indicated additional testing with an alternate test  methodology [phone]) is advised. The SARS-CoV-2 RNA is generally  detectable in upper and lower respiratory sp ecimens during the acute  phase of infection. The expected result is Negative. Fact Sheet for Patients:  StrictlyIdeas.no Fact Sheet for Healthcare Providers: BankingDealers.co.za This test is not yet approved or cleared by the [place] FDA and has been authorized for detection and/or diagnosis of SARS-CoV-2 by FDA under an Emergency Use Authorization (EUA).  This EUA will remain in effect (meaning this test can be used) for the duration of the COVID-19 declaration under Section 564(b)(1) of the Act, 21 U.S.C. section 360bbb-3(b)(1), unless the authorization is terminated or revoked sooner. Performed at [facility], [address], [place], [place] [address]   Culture, blood (Routine X 2) w Reflex to ID Panel     Status: None   Collection Time: 05/24/19  3:19 PM  Result Value Ref Range Status   Specimen Description BLOOD RIGHT ANTECUBITAL  Final   Special Requests   Final    BOTTLES DRAWN AEROBIC AND ANAEROBIC Blood Culture adequate volume   Culture   Final    NO GROWTH 5 DAYS Performed at [facility], [address], [place], [place] [address]    Report Status 05/29/2019 FINAL  Final  Culture, blood (Routine X 2) w Reflex to ID Panel     Status: None   Collection Time: 05/24/19  3:19 PM  Result Value Ref Range Status   Specimen Description BLOOD BLOOD RIGHT WRIST  Final   Special Requests   Final    BOTTLES DRAWN AEROBIC AND ANAEROBIC Blood Culture adequate volume   Culture   Final    NO GROWTH 5 DAYS Performed at [facility], [address], [place],  [place] [address]    Report Status 05/29/2019 FINAL  Final     Scheduled Meds: . sodium chloride   Intravenous Once  . aspirin  325 mg Oral Daily  . atorvastatin  10 mg Oral q1800  . clotrimazole  1 Applicatorful Vaginal QHS  . famciclovir  250 mg Oral Daily  . insulin aspart  0-15 Units Subcutaneous TID WC  . insulin aspart  0-5 Units Subcutaneous QHS  . insulin glargine  20 Units Subcutaneous QHS  . latanoprost  1 drop Both Eyes QHS  . mupirocin cream   Topical BID  . mupirocin ointment   Nasal BID  . PARoxetine  30 mg Oral q morning - 10a   Continuous Infusions:  Procedures/Studies: US Renal  Result Date: 05/25/2019 CLINICAL DATA:  Acute kidney injury. EXAM: RENAL / URINARY TRACT ULTRASOUND COMPLETE COMPARISON:  Ultrasound of December 18, 2018. FINDINGS: Right Kidney: Renal measurements: 15.1 x 7.7 x 6.3 cm = volume: 380 mL . Echogenicity within normal limits. No mass or hydronephrosis visualized. Left Kidney: Renal measurements: 12.6 x 6.5 x 6.4 cm = volume: 270 mL. Echogenicity within normal limits. No mass or hydronephrosis visualized. Bladder: Appears normal for degree of bladder distention. Ureteral jets are not visualized. IMPRESSION: No significant renal abnormality is noted. Electronically Signed   By: Marijo Conception M.D.   On: 05/25/2019 16:04   Nm Pet Image Initial (pi) Whole Body  Result Date: 05/12/2019 CLINICAL DATA:  Initial treatment strategy for multiple myeloma. EXAM: NUCLEAR MEDICINE PET WHOLE BODY TECHNIQUE: 10.0 mCi F-18 FDG was injected intravenously. Full-ring PET imaging was performed from the skull base to thigh after the radiotracer. CT data was obtained and used for attenuation correction and anatomic localization. Fasting blood glucose: 198 mg/dl COMPARISON:  CT chest abdomen pelvis 04/25/2019. FINDINGS: Mediastinal blood pool activity: SUV max 3.9 HEAD/NECK: Mildly asymmetric uptake within the left nasopharynx, without a CT correlate. Otherwise, abnormal  hypermetabolism in the head/neck. Incidental CT findings: None. CHEST: No hypermetabolic mediastinal, hilar or axillary lymph nodes. No hypermetabolic pulmonary nodules. Incidental CT findings: Atherosclerotic calcification of the aorta and coronary arteries. Heart is enlarged. No pericardial or pleural effusion. There may be a few scattered millimetric pulmonary nodules which are distorted due to respiratory motion and expiratory phase imaging. ABDOMEN/PELVIS: No abnormal hypermetabolism in the liver, adrenal glands, spleen or pancreas. No hypermetabolic pathologically enlarged lymph nodes. Incidental CT findings: Liver, adrenal glands and right kidney are unremarkable. Tiny stone in the lower pole left kidney. Spleen, pancreas, stomach and bowel are otherwise unremarkable. Cholecystectomy. Uterus is visualized. There may be bladder wall thickening but the bladder is poorly distended. SKELETON: Focal uptake in anterior right ribs are associated with probable old fractures. Mild hypermetabolism associated with an L2 vertebral body augmentation. Incidental CT findings: L2 vertebral body augmentation. A 12 mm lytic lesion in the left aspect of S1 (series 4, image 181) is again seen and without definitive hypermetabolism. Subtle lysis within the inferior endplate of L5 is better seen on the comparison exam. EXTREMITIES: No abnormal hypermetabolism. Incidental CT findings: There is focal atrophy of the right calf musculature. IMPRESSION: 1. Patchy hypermetabolism throughout the visualized osseous structures. L5 and S1 lucencies do not appear focally hypermetabolic. 2. Left renal stone. 3. Aortic atherosclerosis (ICD10-170.0). Coronary artery calcification. Electronically Signed   By: Lorin Picket M.D.   On: 05/12/2019 09:10   Ct Biopsy  Result Date: 05/04/2019 INDICATION: 68 year old with multiple myeloma.  Request for bone marrow biopsy. EXAM: CT GUIDED BONE MARROW ASPIRATES AND BIOPSY Physician: Stephan Minister. Anselm Pancoast,  MD MEDICATIONS: None. ANESTHESIA/SEDATION: Fentanyl 100 mcg IV; Versed 4.0 mg IV Moderate Sedation Time:  20 minutes The patient was continuously monitored during the procedure by the interventional radiology nurse under my direct supervision. COMPLICATIONS: None immediate. PROCEDURE: The procedure was explained to the patient. The risks and benefits of the procedure were discussed and the patient's questions were addressed. Informed consent was obtained from the patient. The patient was placed prone on CT table. Images of the pelvis were obtained. The right side of back was prepped and draped in sterile fashion. The skin and right posterior ilium were anesthetized with  1% lidocaine. 11 gauge bone needle was directed into the right ilium with CT guidance. No aspirate material could be obtained. Therefore, 2 adequate sized core biopsies were obtained. Bandage placed over the puncture site. IMPRESSION: CT-guided bone marrow core biopsy. No aspirate material could be obtained. Electronically Signed   By: Markus Daft M.D.   On: 05/04/2019 13:16   Ct Bone Marrow Biopsy & Aspiration  Result Date: 05/04/2019 INDICATION: 68 year old with multiple myeloma.  Request for bone marrow biopsy. EXAM: CT GUIDED BONE MARROW ASPIRATES AND BIOPSY Physician: Stephan Minister. Anselm Pancoast, MD MEDICATIONS: None. ANESTHESIA/SEDATION: Fentanyl 100 mcg IV; Versed 4.0 mg IV Moderate Sedation Time:  20 minutes The patient was continuously monitored during the procedure by the interventional radiology nurse under my direct supervision. COMPLICATIONS: None immediate. PROCEDURE: The procedure was explained to the patient. The risks and benefits of the procedure were discussed and the patient's questions were addressed. Informed consent was obtained from the patient. The patient was placed prone on CT table. Images of the pelvis were obtained. The right side of back was prepped and draped in sterile fashion. The skin and right posterior ilium were anesthetized  with 1% lidocaine. 11 gauge bone needle was directed into the right ilium with CT guidance. No aspirate material could be obtained. Therefore, 2 adequate sized core biopsies were obtained. Bandage placed over the puncture site. IMPRESSION: CT-guided bone marrow core biopsy. No aspirate material could be obtained. Electronically Signed   By: Markus Daft M.D.   On: 05/04/2019 13:16    Orson Eva, DO  [facility] Pager [phone]  If 7PM-7AM, please contact night-coverage [email or web address] Password [email or web address] 05/29/2019, 12:08 PM   LOS: 5 days

## 2019-05-29 NOTE — Progress Notes (Signed)
Patient ID: Regan Rakers, female   DOB: 1951/05/23, 68 y.o.   MRN: [id number] [facility] Progress Note   Assessment/ Plan:   1. Acute kidney Injury: With excellent urine output overnight and now downtrending creatinine which reflects that she is possibly in the recovery phase following the plateau phase of ATN (likely hemodynamically mediated in the setting of recent ACE inhibitor use/hypotension/hyperglycemia).  Hyperkalemia corrected.  Discontinue IV fluids. 2.  Metabolic acidosis: Non-anion gap.  Improving following sodium bicarbonate drip/improving renal function. 3.  IgG kappa light chain multiple myeloma: Recently on chemotherapy with Revlimid and Xgeva.  Therapy currently on hold because of pancytopenia. 4.  Pancytopenia: Iatrogenic from Revlimid used for treatment of her multiple myeloma, currently chemotherapy on hold and on Neupogen.  On prophylactic famciclovir and with decent response to 2 units of PRBC transfusion yesterday. 5.  Furunculosis/cellulitis of right groin: Vancomycin discontinued and patient now on doxycycline.  Subjective:   Reports to be feeling much better today especially after transfusion yesterday..   Objective:   BP 130/62 (BP Location: Left Arm)   Pulse 70   Temp 98 F (36.7 C) (Oral)   Resp 17   Ht 5' 2"  (1.575 m)   Wt 94.1 kg   SpO2 98%   BMI 37.94 kg/m   Intake/Output Summary (Last 24 hours) at 05/29/2019 1444 Last data filed at 05/29/2019 1300 Gross per 24 hour  Intake 2186 ml  Output -  Net 2186 ml   Weight change: -0.5 kg  Physical Exam: Gen: Comfortably resting in bed, getting ready to take a shower. CVS: Pulse regular rhythm, normal rate, S1 and S2 normal Resp: Clear to auscultation, no rales/rhonchi Abd: Soft, obese, nontender Ext: No lower extremity edema  Imaging: No results found.  Labs: BMET Recent Labs  Lab 05/24/19 0856 05/25/19 0443 05/26/19 0454 05/27/19 0511 05/28/19 0656 05/29/19 0718  NA 138  136 136 141 140 142  K 3.2* 4.1 4.0 4.8 5.0 4.5  CL 103 106 109 114* 113* 115*  CO2 27 22 20* 21* 20* 21*  GLUCOSE 276* 162* 123* 115* 93 94  BUN 12 16 24* 29* 26* 24*  CREATININE 1.17* 2.17* 3.86* 4.22* 4.31* 3.97*  CALCIUM 7.8* 7.7* 7.5* 8.0* 7.9* 8.1*  PHOS  --   --   --   --  4.4 4.5   CBC Recent Labs  Lab 05/26/19 0454 05/27/19 0955 05/28/19 0656 05/29/19 0718  WBC 0.7* 1.6* 1.4* 2.9*  NEUTROABS 0.4* 0.8* 0.7* 1.8  HGB 7.1* 7.1* 6.6* 8.7*  HCT 21.9* 22.3* 19.9* 27.5*  MCV 99.5 102.3* 100.5* 98.6  PLT 39* 38* 37* 40*   Medications:    . sodium chloride   Intravenous Once  . aspirin  325 mg Oral Daily  . atorvastatin  10 mg Oral q1800  . clotrimazole  1 Applicatorful Vaginal QHS  . doxycycline  100 mg Oral Q12H  . famciclovir  250 mg Oral Daily  . insulin aspart  0-15 Units Subcutaneous TID WC  . insulin aspart  0-5 Units Subcutaneous QHS  . insulin glargine  20 Units Subcutaneous QHS  . latanoprost  1 drop Both Eyes QHS  . mupirocin cream   Topical BID  . mupirocin ointment   Nasal BID  . PARoxetine  30 mg Oral q morning - 10a   Elmarie Shiley, MD 05/29/2019, 2:44 PM

## 2019-05-30 LAB — GLUCOSE, CAPILLARY
Glucose-Capillary: 118 mg/dL — ABNORMAL HIGH (ref 70–99)
Glucose-Capillary: 102 mg/dL — ABNORMAL HIGH (ref 70–99)
Glucose-Capillary: 139 mg/dL — ABNORMAL HIGH (ref 70–99)
Glucose-Capillary: 103 mg/dL — ABNORMAL HIGH (ref 70–99)
Glucose-Capillary: 136 mg/dL — ABNORMAL HIGH (ref 70–99)

## 2019-05-30 LAB — RENAL FUNCTION PANEL
Albumin: 2.5 g/dL — ABNORMAL LOW (ref 3.5–5.0)
Anion gap: 12 (ref 5–15)
BUN: 22 mg/dL (ref 8–23)
CO2: 20 mmol/L — ABNORMAL LOW (ref 22–32)
Calcium: 8.1 mg/dL — ABNORMAL LOW (ref 8.9–10.3)
Chloride: 110 mmol/L (ref 98–111)
Creatinine, Ser: 3.63 mg/dL — ABNORMAL HIGH (ref 0.44–1.00)
GFR calc Af Amer: 14 mL/min — ABNORMAL LOW (ref >60)
GFR calc non Af Amer: 12 mL/min — ABNORMAL LOW (ref >60)
Glucose, Bld: 114 mg/dL — ABNORMAL HIGH (ref 70–99)
Phosphorus: 5 mg/dL — ABNORMAL HIGH (ref 2.5–4.6)
Potassium: 4.4 mmol/L (ref 3.5–5.1)
Sodium: 142 mmol/L (ref 135–145)

## 2019-05-30 LAB — CBC WITH DIFFERENTIAL/PLATELET
Abs Immature Granulocytes: 0.11 10*3/uL — ABNORMAL HIGH (ref 0.00–0.07)
Basophils Absolute: 0 10*3/uL (ref 0.0–0.1)
Basophils Relative: 1 %
Eosinophils Absolute: 0.1 10*3/uL (ref 0.0–0.5)
Eosinophils Relative: 4 %
HCT: 27.1 % — ABNORMAL LOW (ref 36.0–46.0)
Hemoglobin: 8.8 g/dL — ABNORMAL LOW (ref 12.0–15.0)
Immature Granulocytes: 4 %
Lymphocytes Relative: 13 %
Lymphs Abs: 0.4 10*3/uL — ABNORMAL LOW (ref 0.7–4.0)
MCH: 32 pg (ref 26.0–34.0)
MCHC: 32.5 g/dL (ref 30.0–36.0)
MCV: 98.5 fL (ref 80.0–100.0)
Monocytes Absolute: 0.5 10*3/uL (ref 0.1–1.0)
Monocytes Relative: 14 %
Neutro Abs: 2.1 10*3/uL (ref 1.7–7.7)
Neutrophils Relative %: 64 %
Platelets: 43 10*3/uL — ABNORMAL LOW (ref 150–400)
RBC: 2.75 MIL/uL — ABNORMAL LOW (ref 3.87–5.11)
RDW: 14.9 % (ref 11.5–15.5)
WBC: 3.2 10*3/uL — ABNORMAL LOW (ref 4.0–10.5)
nRBC: 0 % (ref 0.0–0.2)

## 2019-05-30 LAB — VANCOMYCIN, TROUGH: Vancomycin Tr: 12 ug/mL — ABNORMAL LOW (ref 15–20)

## 2019-05-30 NOTE — TOC Initial Note (Addendum)
Transition of Care [facility]) - Initial/Assessment Note    Patient Details  Name: Vicki Moon MRN: [id number] Date of Birth: February 21, 1951  Transition of Care [facility]) CM/SW Contact:    Ural Acree, Chauncey Reading, RN Phone Number: 05/30/2019, 1:16 PM  Clinical Narrative:      TOC assessed for high readmission score. From home, independent. Lives with husband. Walks with a cane, uses most all the time as of recently. Has [employer].   Follow with oncologist -  Dr. Rocky Crafts in [facility] (IgG kappa light chain multiple myeloma), has started radiation. Has appt on 05/31/19, CM called office to notify them that patient is still hospitalized. Will call and arrange f/u appt at time of DC.   Husband drives her to appts.  Uses [facility] pharmacy in [place], reports no issues affording/obtaining medicines.  No current home health or assistance.     Expected Discharge Plan: Home/Self Care Barriers to Discharge: No Barriers Identified     Expected Discharge Plan and Services Expected Discharge Plan: Home/Self Care   Discharge Planning Services: CM Consult   Living arrangements for the past 2 months: Single Family Home                  Prior Living Arrangements/Services Living arrangements for the past 2 months: Single Family Home Lives with:: Spouse Patient language and need for interpreter reviewed:: Yes Do you feel safe going back to the place where you live?: Yes      Need for Family Participation in Patient Care: Yes (Comment) Care giver support system in place?: Yes (comment)   Criminal Activity/Legal Involvement Pertinent to Current Situation/Hospitalization: No - Comment as needed  Activities of Daily Living Home Assistive Devices/Equipment: Cane (specify quad or straight) ADL Screening (condition at time of admission) Patient's cognitive ability adequate to safely complete daily activities?: Yes Is the patient deaf or have difficulty hearing?: No Does the patient have  difficulty seeing, even when wearing glasses/contacts?: No Does the patient have difficulty concentrating, remembering, or making decisions?: No Patient able to express need for assistance with ADLs?: Yes Does the patient have difficulty dressing or bathing?: No Independently performs ADLs?: Yes (appropriate for developmental age) Does the patient have difficulty walking or climbing stairs?: Yes Weakness of Legs: Both Weakness of Arms/Hands: None  Permission Sought/Granted   Permission granted to share information with : Yes, Verbal Permission Granted  Share Information with NAME: Dr. Antonieta Pert office           Emotional Assessment     Affect (typically observed): Accepting, Calm Orientation: : Oriented to Self, Oriented to Place, Oriented to  Time   Psych Involvement: No (comment)  Admission diagnosis:  Hypokalemia [E87.6] Rash [R21] Pancytopenia ([place]) [D61.818] Patient Active Problem List   Diagnosis Date Noted  . Hypokalemia   . Furunculosis 05/25/2019  . 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 Diabetes mellitus without complication ([place]) 81/19/1478  . Multiple Skin abscessed due to MRSA 05/24/2019  . Pancytopenia ([place]) 05/24/2019  . Multiple myeloma ([place]) 04/29/2019  . Goals of care, counseling/discussion 04/29/2019  . Pain in abdominal muscle of right flank 03/08/2019  . Abscess of right axilla   . Cellulitis of axilla, right   . Cellulitis 12/17/2018  . AKI (acute kidney injury) ([place]) 12/17/2018  . Anemia 12/17/2018  . Type 2 diabetes mellitus ([place]) 12/17/2018  . Depression 12/17/2018  . Trochanteric bursitis, right hip 08/12/2018  . Post-menopausal 01/19/2018  . Current smoker 01/19/2018  . Mixed hyperlipidemia 07/13/2017  .  Class 2 severe obesity due to excess calories with serious comorbidity and body mass index (BMI) of 37.0 to 37.9 in adult ([place]) 07/13/2017  . Hypercortisolemia ([place]) 01/20/2017  . Excessive weight gain 01/20/2017  . Generalized  abdominal pain 01/20/2017  . Uncontrolled type 2 diabetes mellitus with complication, with long-term current use of insulin ([place]) 12/20/2015  . Essential hypertension, benign 12/20/2015  . Vitamin D deficiency 12/20/2015  . Rectal bleeding 05/29/2015  . HNP (herniated nucleus pulposus), lumbar 07/12/2014    Class: Diagnosis of   PCP:  [name], MD Pharmacy:   [facility] [address] [place] [address] Phone: [phone] Fax: [place], [place] [address] [place] [address] Phone: [phone] Fax: [phone]  [place] [place] [place] [address] [place] [address] Phone: [phone] Fax: [phone]  [facility], [place] - [address] [place] [address] Phone: [phone] Fax: [phone]  Readmission Risk Interventions No flowsheet data found.

## 2019-05-30 NOTE — Care Management Important Message (Signed)
Important Message  Patient Details  Name: Vicki Moon MRN: [id number] Date of Birth: Jul 23, 1951   Medicare Important Message Given:  Other (see comment)(Given to nurse to place at bedside due to contact precautions)    Tommy Medal 05/30/2019, 2:00 PM

## 2019-05-30 NOTE — Progress Notes (Signed)
Patient ID: Vicki Moon, female   DOB: [birth year], 68 y.o.   MRN: [id number] S: Feels a little tired this morning but otherwise ok O:BP (!) 126/58 (BP Location: Right Arm)   Pulse 64   Temp 98.4 F (36.9 C) (Oral)   Resp 17   Ht 5' 2" (1.575 m)   Wt 94.1 kg   SpO2 98%   BMI 37.94 kg/m   Intake/Output Summary (Last 24 hours) at 05/30/2019 0928 Last data filed at 05/30/2019 3832 Gross per 24 hour  Intake 720 ml  Output 450 ml  Net 270 ml   Intake/Output: I/O last 3 completed shifts: In: 1200 [P.O.:960; Other:240] Out: 450 [Urine:450]  Intake/Output this shift:  No intake/output data recorded. Weight change:  Gen: NAD CVS: no rub Resp: cta Abd: benign Ext: trace pretibial edema  Recent Labs  Lab 05/24/19 0856 05/25/19 0443 05/26/19 0454 05/27/19 0511 05/28/19 0656 05/29/19 0718 05/30/19 0900  NA 138 136 136 141 140 142 142  K 3.2* 4.1 4.0 4.8 5.0 4.5 4.4  CL 103 106 109 114* 113* 115* 110  CO2 27 22 20* 21* 20* 21* 20*  GLUCOSE 276* 162* 123* 115* 93 94 114*  BUN 12 16 24* 29* 26* 24* 22  CREATININE 1.17* 2.17* 3.86* 4.22* 4.31* 3.97* 3.63*  ALBUMIN 3.4* 2.5* 2.4* 2.4* 2.4* 2.5* 2.5*  CALCIUM 7.8* 7.7* 7.5* 8.0* 7.9* 8.1* 8.1*  PHOS  --   --   --   --  4.4 4.5 5.0*  AST 14* _0 --   --   --   ALT 23 36 37 35  --   --   --    Liver Function Tests: Recent Labs  Lab 05/25/19 0443 05/26/19 0454 05/27/19 0511 05/28/19 0656 05/29/19 0718 05/30/19 0900  AST _1 --   --   --   ALT 36 37 35  --   --   --   ALKPHOS 92 101 110  --   --   --   BILITOT 0.7 0.7 0.7  --   --   --   PROT 5.9* 5.9* 6.0*  --   --   --   ALBUMIN 2.5* 2.4* 2.4* 2.4* 2.5* 2.5*   No results for input(s): LIPASE, AMYLASE in the last 168 hours. No results for input(s): AMMONIA in the last 168 hours. CBC: Recent Labs  Lab 05/26/19 0454 05/27/19 0955 05/28/19 0656 05/29/19 0718 05/30/19 0634  WBC 0.7* 1.6* 1.4* 2.9* 3.2*  NEUTROABS 0.4* 0.8* 0.7* 1.8 2.1  HGB 7.1*  7.1* 6.6* 8.7* 8.8*  HCT 21.9* 22.3* 19.9* 27.5* 27.1*  MCV 99.5 102.3* 100.5* 98.6 98.5  PLT 39* 38* 37* 40* 43*   Cardiac Enzymes: Recent Labs  Lab 05/26/19 0454  CKTOTAL 137   CBG: Recent Labs  Lab 05/29/19 1118 05/29/19 1604 05/29/19 2137 05/30/19 0304 05/30/19 0753  GLUCAP 117* 144* 133* 118* 102*    Iron Studies: No results for input(s): IRON, TIBC, TRANSFERRIN, FERRITIN in the last 72 hours. Studies/Results: No results found. . sodium chloride   Intravenous Once  . aspirin  325 mg Oral Daily  . atorvastatin  10 mg Oral q1800  . clotrimazole  1 Applicatorful Vaginal QHS  . doxycycline  100 mg Oral Q12H  . famciclovir  250 mg Oral Daily  . insulin aspart  0-15 Units Subcutaneous TID WC  . insulin aspart  0-5 Units Subcutaneous QHS  . insulin glargine  20  Units Subcutaneous QHS  . latanoprost  1 drop Both Eyes QHS  . mupirocin cream   Topical BID  . mupirocin ointment   Nasal BID  . PARoxetine  30 mg Oral q morning - 10a    BMET    Component Value Date/Time   NA 142 05/30/2019 0900   K 4.4 05/30/2019 0900   CL 110 05/30/2019 0900   CO2 20 (L) 05/30/2019 0900   GLUCOSE 114 (H) 05/30/2019 0900   BUN 22 05/30/2019 0900   CREATININE 3.63 (H) 05/30/2019 0900   CREATININE 1.17 (H) 05/24/2019 0856   CREATININE 0.65 01/12/2018 1004   CALCIUM 8.1 (L) 05/30/2019 0900   GFRNONAA 12 (L) 05/30/2019 0900   GFRNONAA 47 (L) 05/24/2019 0856   GFRNONAA 91 01/12/2018 1004   GFRAA 14 (L) 05/30/2019 0900   GFRAA 55 (L) 05/24/2019 0856   GFRAA 106 01/12/2018 1004   CBC    Component Value Date/Time   WBC 3.2 (L) 05/30/2019 0634   RBC 2.75 (L) 05/30/2019 0634   HGB 8.8 (L) 05/30/2019 0634   HGB 8.0 (L) 05/24/2019 0856   HCT 27.1 (L) 05/30/2019 0634   PLT 43 (L) 05/30/2019 0634   PLT 43 (L) 05/24/2019 0856   MCV 98.5 05/30/2019 0634   MCH 32.0 05/30/2019 0634   MCHC 32.5 05/30/2019 0634   RDW 14.9 05/30/2019 0634   LYMPHSABS 0.4 (L) 05/30/2019 0634   MONOABS 0.5  05/30/2019 0634   EOSABS 0.1 05/30/2019 0634   BASOSABS 0.0 05/30/2019 0634     Assessment/Plan:  1. AKI/CKD stage 3- underlying CKD related to underlying multiple myeloma and multiple episodes of AKI.  She presented with disseminated MRSA infection and hypotension with concomitant ace-inhibition as well as IV vanco.  Cr peaked at 4.31 on 05/28/19 and has been steadily improving since stopping lisinopril and after IVF's and improvement of BP.  Off of IVF's overnight with continued improvement.  Continue to follow UOP and Scr.   2. Hyperkalemia- improved 3. MRSA furunculosis/cellulitis of right groin- off of vanco and now on doxycycline 4. IgG kappa light chain multiple myeloma- velcade on hold due to infection and pancytopenia. 5. Pancytopenia due to chemo- followed by Dr. Ennever 6. Metabolic acidosis- stable  Joseph A. Coladonato, MD [facility] [phone]  

## 2019-05-30 NOTE — Progress Notes (Signed)
PROGRESS NOTE  Vicki Moon XLK:[id number] DOB: 1951-11-25 DOA: 05/24/2019 PCP: [name], MD  Brief History: 68 year old female with a history of diabetes mellitus, myeloma, and MRSA skin infections presenting with cellulitis and increasing number of furuncles. The patient went to see her oncologist, Dr. [name] 05/24/2019. She was scheduled to receive her dose of Velcade. However, she was noted to have increasing number of furuncles on her skin. As result, her Velcade was held. It was also held in part due to the patient's pancytopenia. Because of increasing number of furuncles, the patient was recommended to go to the emergency department for further evaluation and intravenous antibiotics. Upon presentation, the patient had temperature 100.0 F but was hemodynamically stable. WBC was the 1.1. The patient was started on vancomycin IV. Unfortunately, she developed AKI. Nephrology was consulted to assist.  Assessment/Plan: Cellulitis right groin/furunculosis -discontinue vancomycin IVdue to renal failure -Continue mupirocin nasal therapy for decolonization -Chlorhexidine baths -improving -vanc trough still therapeutic -as wound is improving, continue doxy  AKI -likely due to hemodynamic changes in the setting of lisinopril -baseline creatinine 0.9-1.1 -serum creatininepeaked 4.32 -d/c lisinopril -renal us--neg hydronephrosis -05/29/19--renal function starting to improve -continue IVF--pt continues to have poor po intake  IgG kappa myeloma -Patient followed by Dr. [name] -Started on Revlimid 05/09/2019 -05/12/2019. PET scan showed patchy areas of hypermetabolism throughout the osseous skeleton -05/04/19 BM biopsy--showed marked increase in plasma cells. I am no histochemistry stain, she had 90% plasma cells. -received Xgeva 06/19/19  Pancytopenia -likely due to Revlimid--stopped 6/2 by Dr. [name] -continueGranix--started 6/4, stopped 6/8  -6/6--transfused 2 units PRBCs -WBC improving  Diabetes mellitus type 2, uncontrolled with hyperglycemia -12/18/2018--hemoglobin A1c 8.1 -Continue Lantus -Continue NovoLog sliding scale -6/3A1C--8.4  Hyperlipidemia -Continue statin  Essential hypertension -Holding lisinopril secondary to acute kidney injuryand soft BPs   Disposition Plan: Home in 1-2 days if renal function continues to improve Family Communication:NoFamily at bedside  Consultants:none  Code Status: FULL  DVT Prophylaxis: SCDS   Procedures: As Listed in Progress Note Above  Antibiotics: vanco6/2>>> Doxy 6/7>>>     Subjective: Still feel general malaise.  Denies cp, sob, n/v/d, headache, abd pain;  Objective: Vitals:   05/29/19 2117 05/29/19 2118 05/29/19 2122 05/30/19 0602  BP:  (!) 140/56 (!) 108/44 (!) 126/58  Pulse: 61 64 63 64  Resp:   17 17  Temp:   98.1 F (36.7 C) 98.4 F (36.9 C)  TempSrc:   Oral Oral  SpO2: 95% 95% 95% 98%  Weight:      Height:        Intake/Output Summary (Last 24 hours) at 05/30/2019 1750 Last data filed at 05/30/2019 3664 Gross per 24 hour  Intake -  Output 450 ml  Net -450 ml   Weight change:  Exam:   General:  Pt is alert, follows commands appropriately, not in acute distress  HEENT: No icterus, No thrush, No neck mass, [place]/AT  Cardiovascular: RRR, S1/S2, no rubs, no gallops  Respiratory: CTA bilaterally, no wheezing, no crackles, no rhonchi  Abdomen: Soft/+BS, non tender, non distended, no guarding  Extremities: No edema, No lymphangitis, No petechiae, No rashes, no synovitis   Data Reviewed: I have personally reviewed following labs and imaging studies Basic Metabolic Panel: Recent Labs  Lab 05/24/19 1519  05/25/19 0443 05/26/19 0454 05/27/19 0511 05/28/19 0656 05/29/19 0718 05/30/19 0900  NA  --   --  136 136 141 140 142 142  K  --   --  4.1 4.0 4.8 5.0 4.5 4.4  CL  --   --  106 109 114* 113* 115* 110   CO2  --   --  22 20* 21* 20* 21* 20*  GLUCOSE  --   --  162* 123* 115* 93 94 114*  BUN  --   --  16 24* 29* 26* 24* 22  CREATININE  --    < > 2.17* 3.86* 4.22* 4.31* 3.97* 3.63*  CALCIUM  --   --  7.7* 7.5* 8.0* 7.9* 8.1* 8.1*  MG 1.7  --  1.7  --   --   --   --   --   PHOS  --   --   --   --   --  4.4 4.5 5.0*   < > = values in this interval not displayed.   Liver Function Tests: Recent Labs  Lab 05/24/19 0856 05/25/19 0443 05/26/19 0454 05/27/19 0511 05/28/19 0656 05/29/19 0718 05/30/19 0900  AST 14* _0 --   --   --   ALT 23 36 37 35  --   --   --   ALKPHOS 100 92 101 110  --   --   --   BILITOT 0.6 0.7 0.7 0.7  --   --   --   PROT 6.9 5.9* 5.9* 6.0*  --   --   --   ALBUMIN 3.4* 2.5* 2.4* 2.4* 2.4* 2.5* 2.5*   No results for input(s): LIPASE, AMYLASE in the last 168 hours. No results for input(s): AMMONIA in the last 168 hours. Coagulation Profile: No results for input(s): INR, PROTIME in the last 168 hours. CBC: Recent Labs  Lab 05/26/19 0454 05/27/19 0955 05/28/19 0656 05/29/19 0718 05/30/19 0634  WBC 0.7* 1.6* 1.4* 2.9* 3.2*  NEUTROABS 0.4* 0.8* 0.7* 1.8 2.1  HGB 7.1* 7.1* 6.6* 8.7* 8.8*  HCT 21.9* 22.3* 19.9* 27.5* 27.1*  MCV 99.5 102.3* 100.5* 98.6 98.5  PLT 39* 38* 37* 40* 43*   Cardiac Enzymes: Recent Labs  Lab 05/26/19 0454  CKTOTAL 137   BNP: Invalid input(s): POCBNP CBG: Recent Labs  Lab 05/29/19 2137 05/30/19 0304 05/30/19 0753 05/30/19 1136 05/30/19 1634  GLUCAP 133* 118* 102* 139* 103*   HbA1C: No results for input(s): HGBA1C in the last 72 hours. Urine analysis:    Component Value Date/Time   COLORURINE STRAW (A) 05/26/2019 0905   APPEARANCEUR HAZY (A) 05/26/2019 0905   LABSPEC 1.008 05/26/2019 0905   PHURINE 6.0 05/26/2019 0905   GLUCOSEU NEGATIVE 05/26/2019 0905   HGBUR NEGATIVE 05/26/2019 0905   BILIRUBINUR NEGATIVE 05/26/2019 0905   KETONESUR NEGATIVE 05/26/2019 0905   PROTEINUR NEGATIVE 05/26/2019 0905    UROBILINOGEN 0.2 07/11/2014 1440   NITRITE NEGATIVE 05/26/2019 0905   LEUKOCYTESUR NEGATIVE 05/26/2019 0905   Sepsis Labs: _1 (procalcitonin:4,lacticidven:4) ) Recent Results (from the past 240 hour(s))  SARS Coronavirus 2 (CEPHEID - Performed in [place] hospital lab), Hosp Order     Status: None   Collection Time: 05/24/19  2:19 PM  Result Value Ref Range Status   SARS Coronavirus 2 NEGATIVE NEGATIVE Final    Comment: (NOTE) If result is NEGATIVE SARS-CoV-2 target nucleic acids are NOT DETECTED. The SARS-CoV-2 RNA is generally detectable in upper and lower  respiratory specimens during the acute phase of infection. The lowest  concentration of SARS-CoV-2 viral copies this assay can detect is 250  copies / mL. A negative result does not preclude SARS-CoV-2 infection  and  should not be used as the sole basis for treatment or other  patient management decisions.  A negative result may occur with  improper specimen collection / handling, submission of specimen other  than nasopharyngeal swab, presence of viral mutation(s) within the  areas targeted by this assay, and inadequate number of viral copies  (<250 copies / mL). A negative result must be combined with clinical  observations, patient history, and epidemiological information. If result is POSITIVE SARS-CoV-2 target nucleic acids are DETECTED. The SARS-CoV-2 RNA is generally detectable in upper and lower  respiratory specimens dur ing the acute phase of infection.  Positive  results are indicative of active infection with SARS-CoV-2.  Clinical  correlation with patient history and other diagnostic information is  necessary to determine patient infection status.  Positive results do  not rule out bacterial infection or co-infection with other viruses. If result is PRESUMPTIVE POSTIVE SARS-CoV-2 nucleic acids MAY BE PRESENT.   A presumptive positive result was obtained on the submitted specimen  and confirmed on  repeat testing.  While 2019 novel coronavirus  (SARS-CoV-2) nucleic acids may be present in the submitted sample  additional confirmatory testing may be necessary for epidemiological  and / or clinical management purposes  to differentiate between  SARS-CoV-2 and other Sarbecovirus currently known to infect humans.  If clinically indicated additional testing with an alternate test  methodology [phone]) is advised. The SARS-CoV-2 RNA is generally  detectable in upper and lower respiratory sp ecimens during the acute  phase of infection. The expected result is Negative. Fact Sheet for Patients:  StrictlyIdeas.no Fact Sheet for Healthcare Providers: BankingDealers.co.za This test is not yet approved or cleared by the [place] FDA and has been authorized for detection and/or diagnosis of SARS-CoV-2 by FDA under an Emergency Use Authorization (EUA).  This EUA will remain in effect (meaning this test can be used) for the duration of the COVID-19 declaration under Section 564(b)(1) of the Act, 21 U.S.C. section 360bbb-3(b)(1), unless the authorization is terminated or revoked sooner. Performed at [facility], [address], [place], [place] [address]   Culture, blood (Routine X 2) w Reflex to ID Panel     Status: None   Collection Time: 05/24/19  3:19 PM  Result Value Ref Range Status   Specimen Description BLOOD RIGHT ANTECUBITAL  Final   Special Requests   Final    BOTTLES DRAWN AEROBIC AND ANAEROBIC Blood Culture adequate volume   Culture   Final    NO GROWTH 5 DAYS Performed at [facility], [address], [place], [place] [address]    Report Status 05/29/2019 FINAL  Final  Culture, blood (Routine X 2) w Reflex to ID Panel     Status: None   Collection Time: 05/24/19  3:19 PM  Result Value Ref Range Status   Specimen Description BLOOD BLOOD RIGHT WRIST  Final   Special Requests   Final    BOTTLES DRAWN AEROBIC AND ANAEROBIC  Blood Culture adequate volume   Culture   Final    NO GROWTH 5 DAYS Performed at [facility], [address] [place],  [address]    Report Status 05/29/2019 FINAL  Final     Scheduled Meds: . sodium chloride   Intravenous Once  . aspirin  325 mg Oral Daily  . atorvastatin  10 mg Oral q1800  . clotrimazole  1 Applicatorful Vaginal QHS  . doxycycline  100 mg Oral Q12H  . famciclovir  250 mg Oral Daily  .  insulin aspart  0-15 Units Subcutaneous TID WC  . insulin aspart  0-5 Units Subcutaneous QHS  . insulin glargine  20 Units Subcutaneous QHS  . latanoprost  1 drop Both Eyes QHS  . mupirocin cream   Topical BID  . mupirocin ointment   Nasal BID  . PARoxetine  30 mg Oral q morning - 10a   Continuous Infusions:  Procedures/Studies: US Renal  Result Date: 05/25/2019 CLINICAL DATA:  Acute kidney injury. EXAM: RENAL / URINARY TRACT ULTRASOUND COMPLETE COMPARISON:  Ultrasound of December 18, 2018. FINDINGS: Right Kidney: Renal measurements: 15.1 x 7.7 x 6.3 cm = volume: 380 mL . Echogenicity within normal limits. No mass or hydronephrosis visualized. Left Kidney: Renal measurements: 12.6 x 6.5 x 6.4 cm = volume: 270 mL. Echogenicity within normal limits. No mass or hydronephrosis visualized. Bladder: Appears normal for degree of bladder distention. Ureteral jets are not visualized. IMPRESSION: No significant renal abnormality is noted. Electronically Signed   By: Marijo Conception M.D.   On: 05/25/2019 16:04   Nm Pet Image Initial (pi) Whole Body  Result Date: 05/12/2019 CLINICAL DATA:  Initial treatment strategy for multiple myeloma. EXAM: NUCLEAR MEDICINE PET WHOLE BODY TECHNIQUE: 10.0 mCi F-18 FDG was injected intravenously. Full-ring PET imaging was performed from the skull base to thigh after the radiotracer. CT data was obtained and used for attenuation correction and anatomic localization. Fasting blood glucose: 198 mg/dl COMPARISON:  CT chest abdomen pelvis 04/25/2019. FINDINGS:  Mediastinal blood pool activity: SUV max 3.9 HEAD/NECK: Mildly asymmetric uptake within the left nasopharynx, without a CT correlate. Otherwise, abnormal hypermetabolism in the head/neck. Incidental CT findings: None. CHEST: No hypermetabolic mediastinal, hilar or axillary lymph nodes. No hypermetabolic pulmonary nodules. Incidental CT findings: Atherosclerotic calcification of the aorta and coronary arteries. Heart is enlarged. No pericardial or pleural effusion. There may be a few scattered millimetric pulmonary nodules which are distorted due to respiratory motion and expiratory phase imaging. ABDOMEN/PELVIS: No abnormal hypermetabolism in the liver, adrenal glands, spleen or pancreas. No hypermetabolic pathologically enlarged lymph nodes. Incidental CT findings: Liver, adrenal glands and right kidney are unremarkable. Tiny stone in the lower pole left kidney. Spleen, pancreas, stomach and bowel are otherwise unremarkable. Cholecystectomy. Uterus is visualized. There may be bladder wall thickening but the bladder is poorly distended. SKELETON: Focal uptake in anterior right ribs are associated with probable old fractures. Mild hypermetabolism associated with an L2 vertebral body augmentation. Incidental CT findings: L2 vertebral body augmentation. A 12 mm lytic lesion in the left aspect of S1 (series 4, image 181) is again seen and without definitive hypermetabolism. Subtle lysis within the inferior endplate of L5 is better seen on the comparison exam. EXTREMITIES: No abnormal hypermetabolism. Incidental CT findings: There is focal atrophy of the right calf musculature. IMPRESSION: 1. Patchy hypermetabolism throughout the visualized osseous structures. L5 and S1 lucencies do not appear focally hypermetabolic. 2. Left renal stone. 3. Aortic atherosclerosis (ICD10-170.0). Coronary artery calcification. Electronically Signed   By: Lorin Picket M.D.   On: 05/12/2019 09:10   Ct Biopsy  Result Date: 05/04/2019  INDICATION: 68 year old with multiple myeloma.  Request for bone marrow biopsy. EXAM: CT GUIDED BONE MARROW ASPIRATES AND BIOPSY Physician: Stephan Minister. Anselm Pancoast, MD MEDICATIONS: None. ANESTHESIA/SEDATION: Fentanyl 100 mcg IV; Versed 4.0 mg IV Moderate Sedation Time:  20 minutes The patient was continuously monitored during the procedure by the interventional radiology nurse under my direct supervision. COMPLICATIONS: None immediate. PROCEDURE: The procedure was explained to the patient. The risks  and benefits of the procedure were discussed and the patient's questions were addressed. Informed consent was obtained from the patient. The patient was placed prone on CT table. Images of the pelvis were obtained. The right side of back was prepped and draped in sterile fashion. The skin and right posterior ilium were anesthetized with 1% lidocaine. 11 gauge bone needle was directed into the right ilium with CT guidance. No aspirate material could be obtained. Therefore, 2 adequate sized core biopsies were obtained. Bandage placed over the puncture site. IMPRESSION: CT-guided bone marrow core biopsy. No aspirate material could be obtained. Electronically Signed   By: Markus Daft M.D.   On: 05/04/2019 13:16   Ct Bone Marrow Biopsy & Aspiration  Result Date: 05/04/2019 INDICATION: 68 year old with multiple myeloma.  Request for bone marrow biopsy. EXAM: CT GUIDED BONE MARROW ASPIRATES AND BIOPSY Physician: Stephan Minister. Anselm Pancoast, MD MEDICATIONS: None. ANESTHESIA/SEDATION: Fentanyl 100 mcg IV; Versed 4.0 mg IV Moderate Sedation Time:  20 minutes The patient was continuously monitored during the procedure by the interventional radiology nurse under my direct supervision. COMPLICATIONS: None immediate. PROCEDURE: The procedure was explained to the patient. The risks and benefits of the procedure were discussed and the patient's questions were addressed. Informed consent was obtained from the patient. The patient was placed prone on CT table.  Images of the pelvis were obtained. The right side of back was prepped and draped in sterile fashion. The skin and right posterior ilium were anesthetized with 1% lidocaine. 11 gauge bone needle was directed into the right ilium with CT guidance. No aspirate material could be obtained. Therefore, 2 adequate sized core biopsies were obtained. Bandage placed over the puncture site. IMPRESSION: CT-guided bone marrow core biopsy. No aspirate material could be obtained. Electronically Signed   By: Markus Daft M.D.   On: 05/04/2019 13:16    Orson Eva, DO  [facility] Pager [phone]  If 7PM-7AM, please contact night-coverage [email or web address] Password [email or web address] 05/30/2019, 5:50 PM   LOS: 6 days

## 2019-05-31 ENCOUNTER — Telehealth: Payer: Self-pay

## 2019-05-31 ENCOUNTER — Ambulatory Visit: Payer: Medicare Other

## 2019-05-31 ENCOUNTER — Other Ambulatory Visit: Payer: Medicare Other

## 2019-05-31 LAB — CBC WITH DIFFERENTIAL/PLATELET
Abs Immature Granulocytes: 0.03 10*3/uL (ref 0.00–0.07)
Basophils Absolute: 0 10*3/uL (ref 0.0–0.1)
Basophils Relative: 1 %
Eosinophils Absolute: 0.1 10*3/uL (ref 0.0–0.5)
Eosinophils Relative: 3 %
HCT: 27.4 % — ABNORMAL LOW (ref 36.0–46.0)
Hemoglobin: 8.7 g/dL — ABNORMAL LOW (ref 12.0–15.0)
Immature Granulocytes: 1 %
Lymphocytes Relative: 17 %
Lymphs Abs: 0.5 10*3/uL — ABNORMAL LOW (ref 0.7–4.0)
MCH: 31.4 pg (ref 26.0–34.0)
MCHC: 31.8 g/dL (ref 30.0–36.0)
MCV: 98.9 fL (ref 80.0–100.0)
Monocytes Absolute: 0.4 10*3/uL (ref 0.1–1.0)
Monocytes Relative: 15 %
Neutro Abs: 1.8 10*3/uL (ref 1.7–7.7)
Neutrophils Relative %: 63 %
Platelets: 51 10*3/uL — ABNORMAL LOW (ref 150–400)
RBC: 2.77 MIL/uL — ABNORMAL LOW (ref 3.87–5.11)
RDW: 14.4 % (ref 11.5–15.5)
WBC: 2.8 10*3/uL — ABNORMAL LOW (ref 4.0–10.5)
nRBC: 0 % (ref 0.0–0.2)

## 2019-05-31 LAB — RENAL FUNCTION PANEL
Albumin: 2.6 g/dL — ABNORMAL LOW (ref 3.5–5.0)
Anion gap: 7 (ref 5–15)
BUN: 23 mg/dL (ref 8–23)
CO2: 23 mmol/L (ref 22–32)
Calcium: 7.8 mg/dL — ABNORMAL LOW (ref 8.9–10.3)
Chloride: 112 mmol/L — ABNORMAL HIGH (ref 98–111)
Creatinine, Ser: 3.3 mg/dL — ABNORMAL HIGH (ref 0.44–1.00)
GFR calc Af Amer: 16 mL/min — ABNORMAL LOW (ref >60)
GFR calc non Af Amer: 13 mL/min — ABNORMAL LOW (ref >60)
Glucose, Bld: 129 mg/dL — ABNORMAL HIGH (ref 70–99)
Phosphorus: 4.9 mg/dL — ABNORMAL HIGH (ref 2.5–4.6)
Potassium: 4.1 mmol/L (ref 3.5–5.1)
Sodium: 142 mmol/L (ref 135–145)

## 2019-05-31 LAB — GLUCOSE, CAPILLARY
Glucose-Capillary: 104 mg/dL — ABNORMAL HIGH (ref 70–99)
Glucose-Capillary: 103 mg/dL — ABNORMAL HIGH (ref 70–99)
Glucose-Capillary: 136 mg/dL — ABNORMAL HIGH (ref 70–99)
Glucose-Capillary: 118 mg/dL — ABNORMAL HIGH (ref 70–99)
Glucose-Capillary: 158 mg/dL — ABNORMAL HIGH (ref 70–99)

## 2019-05-31 NOTE — Progress Notes (Signed)
Patient ID: Vicki Moon, female   DOB: Mar 15, 1951, 68 y.o.   MRN: [id number] S: Feels better today O:BP (!) 132/56 (BP Location: Left Arm)   Pulse (!) 53   Temp 98 F (36.7 C) (Oral)   Resp 16   Ht 5' 2" (1.575 m)   Wt 90.9 kg   SpO2 99%   BMI 36.65 kg/m   Intake/Output Summary (Last 24 hours) at 05/31/2019 0850 Last data filed at 05/31/2019 0539 Gross per 24 hour  Intake 720 ml  Output 1150 ml  Net -430 ml   Intake/Output: I/O last 3 completed shifts: In: 720 [P.O.:720] Out: 1600 [Urine:1600]  Intake/Output this shift:  No intake/output data recorded. Weight change:  Gen: NAD CVS: no rub Resp: cta Abd: benign Ext: trace edema of lower extremities.  Recent Labs  Lab 05/24/19 0856 05/25/19 0443 05/26/19 0454 05/27/19 0511 05/28/19 0656 05/29/19 0718 05/30/19 0900 05/31/19 0613  NA 138 136 136 141 140 142 142 142  K 3.2* 4.1 4.0 4.8 5.0 4.5 4.4 4.1  CL 103 106 109 114* 113* 115* 110 112*  CO2 27 22 20* 21* 20* 21* 20* 23  GLUCOSE 276* 162* 123* 115* 93 94 114* 129*  BUN 12 16 24* 29* 26* 24* 22 23  CREATININE 1.17* 2.17* 3.86* 4.22* 4.31* 3.97* 3.63* 3.30*  ALBUMIN 3.4* 2.5* 2.4* 2.4* 2.4* 2.5* 2.5* 2.6*  CALCIUM 7.8* 7.7* 7.5* 8.0* 7.9* 8.1* 8.1* 7.8*  PHOS  --   --   --   --  4.4 4.5 5.0* 4.9*  AST 14* _0 --   --   --   --   ALT 23 36 37 35  --   --   --   --    Liver Function Tests: Recent Labs  Lab 05/25/19 0443 05/26/19 0454 05/27/19 0511  05/29/19 0718 05/30/19 0900 05/31/19 0613  AST _1 --   --   --   --   ALT 36 37 35  --   --   --   --   ALKPHOS 92 101 110  --   --   --   --   BILITOT 0.7 0.7 0.7  --   --   --   --   PROT 5.9* 5.9* 6.0*  --   --   --   --   ALBUMIN 2.5* 2.4* 2.4*   < > 2.5* 2.5* 2.6*   < > = values in this interval not displayed.   No results for input(s): LIPASE, AMYLASE in the last 168 hours. No results for input(s): AMMONIA in the last 168 hours. CBC: Recent Labs  Lab 05/27/19 0955 05/28/19 0656  05/29/19 0718 05/30/19 0634 05/31/19 0613  WBC 1.6* 1.4* 2.9* 3.2* 2.8*  NEUTROABS 0.8* 0.7* 1.8 2.1 1.8  HGB 7.1* 6.6* 8.7* 8.8* 8.7*  HCT 22.3* 19.9* 27.5* 27.1* 27.4*  MCV 102.3* 100.5* 98.6 98.5 98.9  PLT 38* 37* 40* 43* 51*   Cardiac Enzymes: Recent Labs  Lab 05/26/19 0454  CKTOTAL 137   CBG: Recent Labs  Lab 05/30/19 1136 05/30/19 1634 05/30/19 2204 05/31/19 0348 05/31/19 0748  GLUCAP 139* 103* 136* 104* 103*    Iron Studies: No results for input(s): IRON, TIBC, TRANSFERRIN, FERRITIN in the last 72 hours. Studies/Results: No results found. . sodium chloride   Intravenous Once  . aspirin  325 mg Oral Daily  . atorvastatin  10 mg Oral q1800  . clotrimazole  1 Applicatorful Vaginal QHS  . doxycycline  100 mg Oral Q12H  . famciclovir  250 mg Oral Daily  . insulin aspart  0-15 Units Subcutaneous TID WC  . insulin aspart  0-5 Units Subcutaneous QHS  . insulin glargine  20 Units Subcutaneous QHS  . latanoprost  1 drop Both Eyes QHS  . mupirocin cream   Topical BID  . mupirocin ointment   Nasal BID  . PARoxetine  30 mg Oral q morning - 10a    BMET    Component Value Date/Time   NA 142 05/31/2019 0613   K 4.1 05/31/2019 0613   CL 112 (H) 05/31/2019 0613   CO2 23 05/31/2019 0613   GLUCOSE 129 (H) 05/31/2019 0613   BUN 23 05/31/2019 0613   CREATININE 3.30 (H) 05/31/2019 0613   CREATININE 1.17 (H) 05/24/2019 0856   CREATININE 0.65 01/12/2018 1004   CALCIUM 7.8 (L) 05/31/2019 0613   GFRNONAA 13 (L) 05/31/2019 0613   GFRNONAA 47 (L) 05/24/2019 0856   GFRNONAA 91 01/12/2018 1004   GFRAA 16 (L) 05/31/2019 0613   GFRAA 55 (L) 05/24/2019 0856   GFRAA 106 01/12/2018 1004   CBC    Component Value Date/Time   WBC 2.8 (L) 05/31/2019 0613   RBC 2.77 (L) 05/31/2019 0613   HGB 8.7 (L) 05/31/2019 0613   HGB 8.0 (L) 05/24/2019 0856   HCT 27.4 (L) 05/31/2019 0613   PLT 51 (L) 05/31/2019 0613   PLT 43 (L) 05/24/2019 0856   MCV 98.9 05/31/2019 0613   MCH 31.4  05/31/2019 0613   MCHC 31.8 05/31/2019 0613   RDW 14.4 05/31/2019 0613   LYMPHSABS 0.5 (L) 05/31/2019 0613   MONOABS 0.4 05/31/2019 0613   EOSABS 0.1 05/31/2019 0613   BASOSABS 0.0 05/31/2019 0613     Assessment/Plan:  1. AKI/CKD stage 3- underlying CKD related to underlying multiple myeloma and multiple episodes of AKI.  She presented with disseminated MRSA infection and hypotension with concomitant ace-inhibition as well as IV vanco.  Cr peaked at 4.31 on 05/28/19 and has been steadily improving since stopping lisinopril and after IVF's and improvement of BP.   1. Off of IVF's for the past 2 days with continued, slow improvement.   2. Continue to follow UOP and Scr.   2. Hyperkalemia- improved 3. MRSA furunculosis/cellulitis of right groin- off of vanco and now on doxycycline 4. IgG kappa light chain multiple myeloma- velcade on hold due to infection and pancytopenia. 5. Pancytopenia due to chemo- followed by Dr. Ennever 6. Metabolic acidosis- improving. 7. Nothing further to add.  Will sign off.  Please call with any questions or concerns.  Will follow up with Dr. Bhandari as an outpatient once stable for discharge.   Joseph A. Coladonato, MD [facility] [phone]  

## 2019-05-31 NOTE — Progress Notes (Signed)
PROGRESS NOTE  Vicki Moon QIH:[id number] DOB: 03/16/1951 DOA: 05/24/2019 PCP: [name], MD  Brief History: 68 year old female with a history of diabetes mellitus, myeloma, and MRSA skin infections presenting with cellulitis and increasing number of furuncles. The patient went to see her oncologist, Dr. [name] 05/24/2019. She was scheduled to receive her dose of Velcade. However, she was noted to have increasing number of furuncles on her skin. As result, her Velcade was held. It was also held in part due to the patient's pancytopenia. Because of increasing number of furuncles, the patient was recommended to go to the emergency department for further evaluation and intravenous antibiotics. Upon presentation, the patient had temperature 100.0 F but was hemodynamically stable. WBC was the 1.1. The patient was started on vancomycin IV. Unfortunately, she developed AKI. Nephrology was consulted to assist.  Assessment/Plan: Cellulitis right groin/furunculosis -discontinue vancomycin IVdue to renal failure -Continue mupirocin nasal therapy for decolonization -Chlorhexidine baths -improved -as wound is improving, continue doxy -finish 9 days abx total on 6/10  AKI -likely due to hemodynamic changes in the setting of lisinopril -baseline creatinine 0.9-1.1 -serum creatininepeaked 4.32 -d/c lisinopril -renal us--neg hydronephrosis -05/29/19--renal function starting to improve -renal function continues to slowly improve off IVF -follow up with Dr. Carolin Sicks after d/c  IgG kappa myeloma -Patient followed by Dr. [name] -Started on Revlimid 05/09/2019 -05/12/2019. PET scan showed patchy areas of hypermetabolism throughout the osseous skeleton -05/04/19 BM biopsy--showed marked increase in plasma cells. I am no histochemistry stain, she had 90% plasma cells. -received Xgeva 06/19/19  Pancytopenia -likely due to Revlimid--stopped 6/2 by Dr. [name]  -continueGranix--started 6/4, stopped 6/8 -6/6--transfused2 units PRBCs -WBC improving  Diabetes mellitus type 2, uncontrolled with hyperglycemia -12/18/2018--hemoglobin A1c 8.1 -Continue Lantus -Continue NovoLog sliding scale -6/3A1C--8.4 -CBGs well controlled  Hyperlipidemia -Continue statin  Essential hypertension -Holding lisinopril secondary to acute kidney injuryand soft BPs -BP remains stable   Disposition Plan: Home 6/10 if renal function stable/improved Family Communication:NoFamily at bedside  Consultants:none  Code Status: FULL  DVT Prophylaxis: SCDS   Procedures: As Listed in Progress Note Above  Antibiotics: vanco6/2>>> Doxy 6/7>>>6/10     Subjective: Patient denies fevers, chills, headache, chest pain, dyspnea, nausea, vomiting, diarrhea, abdominal pain, dysuria, hematuria, hematochezia, and melena. She still feels fatigued but is improving  Objective: Vitals:   05/30/19 1955 05/30/19 2200 05/31/19 0500 05/31/19 1538  BP:  (!) 133/59 (!) 132/56 (!) 102/52  Pulse:  61 (!) 53 (!) 57  Resp:   16 20  Temp:  97.7 F (36.5 C) 98 F (36.7 C) 98.4 F (36.9 C)  TempSrc:  Oral Oral Oral  SpO2: 92% 97% 99% 97%  Weight:   90.9 kg   Height:        Intake/Output Summary (Last 24 hours) at 05/31/2019 1727 Last data filed at 05/31/2019 1100 Gross per 24 hour  Intake 1080 ml  Output 1300 ml  Net -220 ml   Weight change:  Exam:   General:  Pt is alert, follows commands appropriately, not in acute distress  HEENT: No icterus, No thrush, No neck mass, [place]/AT  Cardiovascular: RRR, S1/S2, no rubs, no gallops  Respiratory: CTA bilaterally, no wheezing, no crackles, no rhonchi  Abdomen: Soft/+BS, non tender, non distended, no guarding  Extremities: No edema, No lymphangitis, No petechiae, No rashes, no synovitis; Right groin without erythema or drainage   Data Reviewed: I have personally reviewed following labs and  imaging studies  Basic Metabolic Panel: Recent Labs  Lab 05/25/19 0443  05/27/19 0511 05/28/19 0656 05/29/19 0718 05/30/19 0900 05/31/19 0613  NA 136   < > 141 140 142 142 142  K 4.1   < > 4.8 5.0 4.5 4.4 4.1  CL 106   < > 114* 113* 115* 110 112*  CO2 22   < > 21* 20* 21* 20* 23  GLUCOSE 162*   < > 115* 93 94 114* 129*  BUN 16   < > 29* 26* 24* 22 23  CREATININE 2.17*   < > 4.22* 4.31* 3.97* 3.63* 3.30*  CALCIUM 7.7*   < > 8.0* 7.9* 8.1* 8.1* 7.8*  MG 1.7  --   --   --   --   --   --   PHOS  --   --   --  4.4 4.5 5.0* 4.9*   < > = values in this interval not displayed.   Liver Function Tests: Recent Labs  Lab 05/25/19 0443 05/26/19 0454 05/27/19 0511 05/28/19 0656 05/29/19 0718 05/30/19 0900 05/31/19 0613  AST _0 --   --   --   --   ALT 36 37 35  --   --   --   --   ALKPHOS 92 101 110  --   --   --   --   BILITOT 0.7 0.7 0.7  --   --   --   --   PROT 5.9* 5.9* 6.0*  --   --   --   --   ALBUMIN 2.5* 2.4* 2.4* 2.4* 2.5* 2.5* 2.6*   No results for input(s): LIPASE, AMYLASE in the last 168 hours. No results for input(s): AMMONIA in the last 168 hours. Coagulation Profile: No results for input(s): INR, PROTIME in the last 168 hours. CBC: Recent Labs  Lab 05/27/19 0955 05/28/19 0656 05/29/19 0718 05/30/19 0634 05/31/19 0613  WBC 1.6* 1.4* 2.9* 3.2* 2.8*  NEUTROABS 0.8* 0.7* 1.8 2.1 1.8  HGB 7.1* 6.6* 8.7* 8.8* 8.7*  HCT 22.3* 19.9* 27.5* 27.1* 27.4*  MCV 102.3* 100.5* 98.6 98.5 98.9  PLT 38* 37* 40* 43* 51*   Cardiac Enzymes: Recent Labs  Lab 05/26/19 0454  CKTOTAL 137   BNP: Invalid input(s): POCBNP CBG: Recent Labs  Lab 05/30/19 2204 05/31/19 0348 05/31/19 0748 05/31/19 1102 05/31/19 1703  GLUCAP 136* 104* 103* 136* 118*   HbA1C: No results for input(s): HGBA1C in the last 72 hours. Urine analysis:    Component Value Date/Time   COLORURINE STRAW (A) 05/26/2019 0905   APPEARANCEUR HAZY (A) 05/26/2019 0905   LABSPEC 1.008 05/26/2019  0905   PHURINE 6.0 05/26/2019 0905   GLUCOSEU NEGATIVE 05/26/2019 0905   HGBUR NEGATIVE 05/26/2019 0905   BILIRUBINUR NEGATIVE 05/26/2019 0905   KETONESUR NEGATIVE 05/26/2019 0905   PROTEINUR NEGATIVE 05/26/2019 0905   UROBILINOGEN 0.2 07/11/2014 1440   NITRITE NEGATIVE 05/26/2019 0905   LEUKOCYTESUR NEGATIVE 05/26/2019 0905   Sepsis Labs: _1 (procalcitonin:4,lacticidven:4) ) Recent Results (from the past 240 hour(s))  SARS Coronavirus 2 (CEPHEID - Performed in [facility] hospital lab), Hosp Order     Status: None   Collection Time: 05/24/19  2:19 PM  Result Value Ref Range Status   SARS Coronavirus 2 NEGATIVE NEGATIVE Final    Comment: (NOTE) If result is NEGATIVE SARS-CoV-2 target nucleic acids are NOT DETECTED. The SARS-CoV-2 RNA is generally detectable in upper and lower  respiratory specimens during the acute phase of infection.  The lowest  concentration of SARS-CoV-2 viral copies this assay can detect is 250  copies / mL. A negative result does not preclude SARS-CoV-2 infection  and should not be used as the sole basis for treatment or other  patient management decisions.  A negative result may occur with  improper specimen collection / handling, submission of specimen other  than nasopharyngeal swab, presence of viral mutation(s) within the  areas targeted by this assay, and inadequate number of viral copies  (<250 copies / mL). A negative result must be combined with clinical  observations, patient history, and epidemiological information. If result is POSITIVE SARS-CoV-2 target nucleic acids are DETECTED. The SARS-CoV-2 RNA is generally detectable in upper and lower  respiratory specimens dur ing the acute phase of infection.  Positive  results are indicative of active infection with SARS-CoV-2.  Clinical  correlation with patient history and other diagnostic information is  necessary to determine patient infection status.  Positive results do  not rule out  bacterial infection or co-infection with other viruses. If result is PRESUMPTIVE POSTIVE SARS-CoV-2 nucleic acids MAY BE PRESENT.   A presumptive positive result was obtained on the submitted specimen  and confirmed on repeat testing.  While 2019 novel coronavirus  (SARS-CoV-2) nucleic acids may be present in the submitted sample  additional confirmatory testing may be necessary for epidemiological  and / or clinical management purposes  to differentiate between  SARS-CoV-2 and other Sarbecovirus currently known to infect humans.  If clinically indicated additional testing with an alternate test  methodology [phone]) is advised. The SARS-CoV-2 RNA is generally  detectable in upper and lower respiratory sp ecimens during the acute  phase of infection. The expected result is Negative. Fact Sheet for Patients:  StrictlyIdeas.no Fact Sheet for Healthcare Providers: BankingDealers.co.za This test is not yet approved or cleared by the [place] FDA and has been authorized for detection and/or diagnosis of SARS-CoV-2 by FDA under an Emergency Use Authorization (EUA).  This EUA will remain in effect (meaning this test can be used) for the duration of the COVID-19 declaration under Section 564(b)(1) of the Act, 21 U.S.C. section 360bbb-3(b)(1), unless the authorization is terminated or revoked sooner. Performed at [facility], [address] [place], [place] [address]   Culture, blood (Routine X 2) w Reflex to ID Panel     Status: None   Collection Time: 05/24/19  3:19 PM  Result Value Ref Range Status   Specimen Description BLOOD RIGHT ANTECUBITAL  Final   Special Requests   Final    BOTTLES DRAWN AEROBIC AND ANAEROBIC Blood Culture adequate volume   Culture   Final    NO GROWTH 5 DAYS Performed at [place] [facility], [address], [place], [place] [address]    Report Status 05/29/2019 FINAL  Final  Culture, blood (Routine X 2) w Reflex to  ID Panel     Status: None   Collection Time: 05/24/19  3:19 PM  Result Value Ref Range Status   Specimen Description BLOOD BLOOD RIGHT WRIST  Final   Special Requests   Final    BOTTLES DRAWN AEROBIC AND ANAEROBIC Blood Culture adequate volume   Culture   Final    NO GROWTH 5 DAYS Performed at [facility], [address] [place], [place] [address]    Report Status 05/29/2019 FINAL  Final     Scheduled Meds: . sodium chloride   Intravenous Once  . aspirin  325 mg Oral Daily  . atorvastatin  10  mg Oral q1800  . clotrimazole  1 Applicatorful Vaginal QHS  . doxycycline  100 mg Oral Q12H  . famciclovir  250 mg Oral Daily  . insulin aspart  0-15 Units Subcutaneous TID WC  . insulin aspart  0-5 Units Subcutaneous QHS  . insulin glargine  20 Units Subcutaneous QHS  . latanoprost  1 drop Both Eyes QHS  . mupirocin cream   Topical BID  . mupirocin ointment   Nasal BID  . PARoxetine  30 mg Oral q morning - 10a   Continuous Infusions:  Procedures/Studies: US Renal  Result Date: 05/25/2019 CLINICAL DATA:  Acute kidney injury. EXAM: RENAL / URINARY TRACT ULTRASOUND COMPLETE COMPARISON:  Ultrasound of December 18, 2018. FINDINGS: Right Kidney: Renal measurements: 15.1 x 7.7 x 6.3 cm = volume: 380 mL . Echogenicity within normal limits. No mass or hydronephrosis visualized. Left Kidney: Renal measurements: 12.6 x 6.5 x 6.4 cm = volume: 270 mL. Echogenicity within normal limits. No mass or hydronephrosis visualized. Bladder: Appears normal for degree of bladder distention. Ureteral jets are not visualized. IMPRESSION: No significant renal abnormality is noted. Electronically Signed   By: Marijo Conception M.D.   On: 05/25/2019 16:04   Nm Pet Image Initial (pi) Whole Body  Result Date: 05/12/2019 CLINICAL DATA:  Initial treatment strategy for multiple myeloma. EXAM: NUCLEAR MEDICINE PET WHOLE BODY TECHNIQUE: 10.0 mCi F-18 FDG was injected intravenously. Full-ring PET imaging was performed from the  skull base to thigh after the radiotracer. CT data was obtained and used for attenuation correction and anatomic localization. Fasting blood glucose: 198 mg/dl COMPARISON:  CT chest abdomen pelvis 04/25/2019. FINDINGS: Mediastinal blood pool activity: SUV max 3.9 HEAD/NECK: Mildly asymmetric uptake within the left nasopharynx, without a CT correlate. Otherwise, abnormal hypermetabolism in the head/neck. Incidental CT findings: None. CHEST: No hypermetabolic mediastinal, hilar or axillary lymph nodes. No hypermetabolic pulmonary nodules. Incidental CT findings: Atherosclerotic calcification of the aorta and coronary arteries. Heart is enlarged. No pericardial or pleural effusion. There may be a few scattered millimetric pulmonary nodules which are distorted due to respiratory motion and expiratory phase imaging. ABDOMEN/PELVIS: No abnormal hypermetabolism in the liver, adrenal glands, spleen or pancreas. No hypermetabolic pathologically enlarged lymph nodes. Incidental CT findings: Liver, adrenal glands and right kidney are unremarkable. Tiny stone in the lower pole left kidney. Spleen, pancreas, stomach and bowel are otherwise unremarkable. Cholecystectomy. Uterus is visualized. There may be bladder wall thickening but the bladder is poorly distended. SKELETON: Focal uptake in anterior right ribs are associated with probable old fractures. Mild hypermetabolism associated with an L2 vertebral body augmentation. Incidental CT findings: L2 vertebral body augmentation. A 12 mm lytic lesion in the left aspect of S1 (series 4, image 181) is again seen and without definitive hypermetabolism. Subtle lysis within the inferior endplate of L5 is better seen on the comparison exam. EXTREMITIES: No abnormal hypermetabolism. Incidental CT findings: There is focal atrophy of the right calf musculature. IMPRESSION: 1. Patchy hypermetabolism throughout the visualized osseous structures. L5 and S1 lucencies do not appear focally  hypermetabolic. 2. Left renal stone. 3. Aortic atherosclerosis (ICD10-170.0). Coronary artery calcification. Electronically Signed   By: Lorin Picket M.D.   On: 05/12/2019 09:10   Ct Biopsy  Result Date: 05/04/2019 INDICATION: 68 year old with multiple myeloma.  Request for bone marrow biopsy. EXAM: CT GUIDED BONE MARROW ASPIRATES AND BIOPSY Physician: Stephan Minister. Anselm Pancoast, MD MEDICATIONS: None. ANESTHESIA/SEDATION: Fentanyl 100 mcg IV; Versed 4.0 mg IV Moderate Sedation Time:  20 minutes The  patient was continuously monitored during the procedure by the interventional radiology nurse under my direct supervision. COMPLICATIONS: None immediate. PROCEDURE: The procedure was explained to the patient. The risks and benefits of the procedure were discussed and the patient's questions were addressed. Informed consent was obtained from the patient. The patient was placed prone on CT table. Images of the pelvis were obtained. The right side of back was prepped and draped in sterile fashion. The skin and right posterior ilium were anesthetized with 1% lidocaine. 11 gauge bone needle was directed into the right ilium with CT guidance. No aspirate material could be obtained. Therefore, 2 adequate sized core biopsies were obtained. Bandage placed over the puncture site. IMPRESSION: CT-guided bone marrow core biopsy. No aspirate material could be obtained. Electronically Signed   By: Markus Daft M.D.   On: 05/04/2019 13:16   Ct Bone Marrow Biopsy & Aspiration  Result Date: 05/04/2019 INDICATION: 68 year old with multiple myeloma.  Request for bone marrow biopsy. EXAM: CT GUIDED BONE MARROW ASPIRATES AND BIOPSY Physician: Stephan Minister. Anselm Pancoast, MD MEDICATIONS: None. ANESTHESIA/SEDATION: Fentanyl 100 mcg IV; Versed 4.0 mg IV Moderate Sedation Time:  20 minutes The patient was continuously monitored during the procedure by the interventional radiology nurse under my direct supervision. COMPLICATIONS: None immediate. PROCEDURE: The  procedure was explained to the patient. The risks and benefits of the procedure were discussed and the patient's questions were addressed. Informed consent was obtained from the patient. The patient was placed prone on CT table. Images of the pelvis were obtained. The right side of back was prepped and draped in sterile fashion. The skin and right posterior ilium were anesthetized with 1% lidocaine. 11 gauge bone needle was directed into the right ilium with CT guidance. No aspirate material could be obtained. Therefore, 2 adequate sized core biopsies were obtained. Bandage placed over the puncture site. IMPRESSION: CT-guided bone marrow core biopsy. No aspirate material could be obtained. Electronically Signed   By: Markus Daft M.D.   On: 05/04/2019 13:16    Orson Eva, DO  [facility] Pager [phone]  If 7PM-7AM, please contact night-coverage [email or web address] Password [email or web address] 05/31/2019, 5:27 PM   LOS: 7 days

## 2019-05-31 NOTE — Telephone Encounter (Signed)
Received call from [facility] with [employer] stating that they had contacted pt about delivering her next refill of Revlimid. Upon reaching pt, she reported she is currently admitted to the hospital. Vicki Moon states she will put the script on hold at this time. Dr [name] aware. dph

## 2019-06-01 DIAGNOSIS — E270 Other adrenocortical overactivity: Secondary | ICD-10-CM

## 2019-06-01 DIAGNOSIS — D61818 Other pancytopenia: Secondary | ICD-10-CM

## 2019-06-01 DIAGNOSIS — Z794 Long term (current) use of insulin: Secondary | ICD-10-CM

## 2019-06-01 DIAGNOSIS — N179 Acute kidney failure, unspecified: Secondary | ICD-10-CM

## 2019-06-01 DIAGNOSIS — C9000 Multiple myeloma not having achieved remission: Secondary | ICD-10-CM

## 2019-06-01 DIAGNOSIS — E118 Type 2 diabetes mellitus with unspecified complications: Secondary | ICD-10-CM

## 2019-06-01 DIAGNOSIS — E1165 Type 2 diabetes mellitus with hyperglycemia: Secondary | ICD-10-CM

## 2019-06-01 DIAGNOSIS — I10 Essential (primary) hypertension: Secondary | ICD-10-CM

## 2019-06-01 DIAGNOSIS — E876 Hypokalemia: Secondary | ICD-10-CM

## 2019-06-01 DIAGNOSIS — A4902 Methicillin resistant Staphylococcus aureus infection, unspecified site: Secondary | ICD-10-CM

## 2019-06-01 DIAGNOSIS — L0292 Furuncle, unspecified: Secondary | ICD-10-CM

## 2019-06-01 DIAGNOSIS — F17200 Nicotine dependence, unspecified, uncomplicated: Secondary | ICD-10-CM

## 2019-06-01 LAB — CBC WITH DIFFERENTIAL/PLATELET
Abs Immature Granulocytes: 0.05 10*3/uL (ref 0.00–0.07)
Basophils Absolute: 0 10*3/uL (ref 0.0–0.1)
Basophils Relative: 1 %
Eosinophils Absolute: 0.1 10*3/uL (ref 0.0–0.5)
Eosinophils Relative: 2 %
HCT: 26.5 % — ABNORMAL LOW (ref 36.0–46.0)
Hemoglobin: 8.7 g/dL — ABNORMAL LOW (ref 12.0–15.0)
Immature Granulocytes: 2 %
Lymphocytes Relative: 23 %
Lymphs Abs: 0.5 10*3/uL — ABNORMAL LOW (ref 0.7–4.0)
MCH: 31.8 pg (ref 26.0–34.0)
MCHC: 32.8 g/dL (ref 30.0–36.0)
MCV: 96.7 fL (ref 80.0–100.0)
Monocytes Absolute: 0.4 10*3/uL (ref 0.1–1.0)
Monocytes Relative: 18 %
Neutro Abs: 1.1 10*3/uL — ABNORMAL LOW (ref 1.7–7.7)
Neutrophils Relative %: 54 %
Platelets: 58 10*3/uL — ABNORMAL LOW (ref 150–400)
RBC: 2.74 MIL/uL — ABNORMAL LOW (ref 3.87–5.11)
RDW: 14.5 % (ref 11.5–15.5)
WBC: 2.1 10*3/uL — ABNORMAL LOW (ref 4.0–10.5)
nRBC: 0 % (ref 0.0–0.2)

## 2019-06-01 LAB — GLUCOSE, CAPILLARY
Glucose-Capillary: 107 mg/dL — ABNORMAL HIGH (ref 70–99)
Glucose-Capillary: 102 mg/dL — ABNORMAL HIGH (ref 70–99)
Glucose-Capillary: 117 mg/dL — ABNORMAL HIGH (ref 70–99)

## 2019-06-01 LAB — RENAL FUNCTION PANEL
Albumin: 2.6 g/dL — ABNORMAL LOW (ref 3.5–5.0)
Anion gap: 9 (ref 5–15)
BUN: 23 mg/dL (ref 8–23)
CO2: 23 mmol/L (ref 22–32)
Calcium: 8 mg/dL — ABNORMAL LOW (ref 8.9–10.3)
Chloride: 110 mmol/L (ref 98–111)
Creatinine, Ser: 2.96 mg/dL — ABNORMAL HIGH (ref 0.44–1.00)
GFR calc Af Amer: 18 mL/min — ABNORMAL LOW (ref >60)
GFR calc non Af Amer: 15 mL/min — ABNORMAL LOW (ref >60)
Glucose, Bld: 116 mg/dL — ABNORMAL HIGH (ref 70–99)
Phosphorus: 4.9 mg/dL — ABNORMAL HIGH (ref 2.5–4.6)
Potassium: 4.1 mmol/L (ref 3.5–5.1)
Sodium: 142 mmol/L (ref 135–145)

## 2019-06-01 MED ORDER — DOXYCYCLINE HYCLATE 100 MG PO CAPS: 100.0000 mg | ORAL_CAPSULE | Freq: Two times a day (BID) | ORAL | 0 refills | Status: DC

## 2019-06-01 NOTE — Discharge Summary (Signed)
Physician Discharge Summary  Vicki Moon JKD:[id number] DOB: 10/04/1951 DOA: 05/24/2019  PCP: [name], MD  Admit date: 05/24/2019 Discharge date: 06/01/2019  Time spent: 35 minutes  Recommendations for Outpatient Follow-up:  1. Repeat basic metabolic panel to follow electrolytes and renal function 2. Repeat CBC to follow white blood cells, hemoglobin and platelet count and stability.    Discharge Diagnoses:  Principal Problem:   Disseminated MRSA infection Active Problems:   Uncontrolled type 2 diabetes mellitus with complication, with long-term current use of insulin (HCC)   Essential hypertension, benign   Hypercortisolemia (HCC)   Current smoker   Cellulitis   Multiple myeloma ([place])   Diabetes mellitus without complication ([place])   Multiple Skin abscessed due to MRSA   Pancytopenia ([place])   Furunculosis   Hypokalemia   Discharge Condition: Stable and improved.  Patient discharged home with instructions to follow-up with PCP in 10 days to follow-up with her hematologist/oncologist as previously scheduled.  Diet recommendation: Heart healthy/modified carbohydrate diet.  Filed Weights   05/29/19 0557 05/31/19 0500 06/01/19 0602  Weight: 94.1 kg 90.9 kg 90.7 kg    History of present illness:  As per H&P written by Dr. Wynetta Emery on 05/24/2019 68 y.o. female who is currently being treated by Dr. [name] for multiple myeloma was seen in the office today to get her Velcade treatment.  She has been having problems with this recurrent disseminated MRSA infection that manifested by multiple skin abscesses all over her body.  She was hospitalized for this back in December 2019.  Now she is having a recurrence of this MRSA infection.  Her treatment for multiple myeloma was placed on temporary hold by her oncologist.  She is going to stop the radiation temporarily and stop the Revlimid temporarily because she is pancytopenic.  The treatments for multiple myeloma had been working as  evidenced by improvement in the M spike being down and her IgG level was improving.  She has been dealing with multiple skin abscesses.  The biggest one is noted to be in the right inguinal area and it has been painful.  She has had I&D procedures done in the past.  ED course: The patient noted to have hypokalemia with a potassium of 3.2.  Creatinine 1.17.  GFR 47.  Her WBC was 1.1.  Platelet count 43.  Hemoglobin 8.0.  Her temp is 99.3.  Her blood pressure is 130/44.  Pulse ox 100% on room air.  The patient was started on IV vancomycin and Bactroban topical treatments.  Blood cultures pending. Pt was bolused IV fluids and given oral potassium.  Admission was requested.    Hospital Course:  Cellulitis right groin/furunculosis -Treated initially with IV vancomycin; which was discontinue due to renal failure -Patient completed mupirocin nasal therapy for decolonization and the use of Chlorhexidine baths. -improved and stable for outpatient therapy -will discharge on oral doxy for another 5 days to complete therapy.   AKI -likely due to hemodynamic changes in the setting of lisinopril and vancomycin. -baseline creatinine 0.9-1.1 -serum creatininepeaked 4.32 -d/c lisinopril at time of discharge. -renal us--neg hydronephrosis -Renal function improving steadily trending down -Creatinine 2.96 at discharge. -follow up with Dr. Carolin Sicks after d/c (nephrologist). -Repeat basic metabolic panel to follow electrolytes and renal function.  IgG kappa myeloma -Started on Revlimid 05/09/2019 -05/12/2019. PET scan showed patchy areas of hypermetabolism throughout the osseous skeleton -05/04/19 BM biopsy--showed marked increase in plasma cells. I am no histochemistry stain, she had 90% plasma cells. -received  Delton See 06/19/19 -Continue outpatient follow-up with Dr. [name]  Pancytopenia -likely due to Revlimid--stopped 6/2 by Dr. [name] -continueGranix--started 6/4, stopped 6/8 -6/6--transfused2  units PRBCs -WBC improving and hemoglobin stable -Continue outpatient follow-up with hematologist/oncologist  Diabetes mellitus type 2, uncontrolled with hyperglycemia -Continue home hypoglycemic regimen -6/3A1C--8.4 -CBGs overall well controlled -Patient advised to follow modified carbohydrate diet.  Hyperlipidemia -Continue statin  Essential hypertension -Continue holding lisinopril secondary to acute kidney injuryand soft BPs -BP remains stable -Advised to follow heart healthy diet -Reassess vital signs and further adjust antihypertensive regimen (with resumption of lisinopril if renal function back to normal.  Procedures:  See below for x-ray reports.  Consultations:  Renal service  Discharge Exam: Vitals:   06/01/19 0602 06/01/19 1315  BP: (!) 143/82 121/84  Pulse: (!) 52 (!) 57  Resp: 18 16  Temp: 97.7 F (36.5 C) 98 F (36.7 C)  SpO2: 97% 98%    General: Afebrile, denies chest pain, no nausea, no vomiting.  In no acute distress and eager to go home. Cardiovascular: RRR, S1 and S2, no rubs, no gallops, no JVD on exam. Respiratory: Clear to auscultation bilaterally, no requiring oxygen supplementation.  No using accessory muscle. Abdomen: Soft, nontender, nondistended, positive bowel sounds Extremities: No edema, no cyanosis or clubbing.   Skin: no open wounds (patient expressed feeling mild bump close to major labia in her vagina.  Discharge Instructions   Discharge Instructions    Diet - low sodium heart healthy   Complete by:  As directed    Discharge instructions   Complete by:  As directed    Take medications as prescribed [place], hydration Arrange follow-up with PCP in 10 days Follow heart healthy diet     Allergies as of 06/01/2019      Reactions   Metformin Nausea Only   "severe GI"      Medication List    STOP taking these medications   lisinopril 10 MG tablet Commonly known as:  ZESTRIL     TAKE these medications    ALPRAZolam 0.5 MG tablet Commonly known as:  XANAX Take 0.5 mg by mouth daily.   aspirin 325 MG tablet Take 325 mg by mouth daily.   atorvastatin 10 MG tablet Commonly known as:  LIPITOR Take 10 mg by mouth daily.   B-D ULTRAFINE III SHORT PEN 31G X 8 MM Misc Generic drug:  Insulin Pen Needle USE ONE PEN NEEDLE 4 TIMES DAILY   doxycycline 100 MG capsule Commonly known as:  VIBRAMYCIN Take 1 capsule (100 mg total) by mouth 2 (two) times daily.   famciclovir 250 MG tablet Commonly known as:  FAMVIR Take 1 tablet (250 mg total) by mouth daily.   FreeStyle [employer] Use one sensor every 10 days.   glipiZIDE 10 MG 24 hr tablet Commonly known as:  GLUCOTROL XL TAKE 1 TABLET BY MOUTH ONCE DAILY FOR SUGARS   HYDROcodone-acetaminophen 5-325 MG tablet Commonly known as:  NORCO/VICODIN Take 1 tablet by mouth every 6 (six) hours as needed for moderate pain.   insulin glargine 100 UNIT/ML injection Commonly known as:  LANTUS Inject 30 Units into the skin at bedtime.   insulin lispro 100 UNIT/ML KiwkPen Commonly known as:  HumaLOG KwikPen Inject 0.22-0.28 mLs (22-28 Units total) into the skin 3 (three) times daily with meals.   latanoprost 0.005 % ophthalmic solution Commonly known as:  XALATAN Place 1 drop into both eyes at bedtime.   lenalidomide 25 MG capsule Commonly known as:  REVLIMID Take 1 pill daily at bedtime for 21 days on and 7 days off. [facility]#[id number]   NovoLOG 100 UNIT/ML injection Generic drug:  insulin aspart INJECT 35 UNITS SUBCUTANEOUSLY WITH MEALS   ondansetron 8 MG tablet Commonly known as:  Zofran Take 1 tablet (8 mg total) by mouth 2 (two) times daily as needed (Nausea or vomiting). What changed:  Another medication with the same name was removed. Continue taking this medication, and follow the directions you see here.   OneTouch Verio test strip Generic drug:  glucose blood USE 1 STRIP TO CHECK GLUCOSE 4 TIMES DAILY   PARoxetine  30 MG tablet Commonly known as:  PAXIL Take 30 mg by mouth every morning.   prochlorperazine 10 MG tablet Commonly known as:  COMPAZINE Take 1 tablet (10 mg total) by mouth every 6 (six) hours as needed (Nausea or vomiting).      Allergies  Allergen Reactions  . Metformin Nausea Only    "severe GI"   Follow-up Information    [name], MD Follow up on 06/07/2019.   Specialty:  Internal Medicine Why:  Please follow up with Dr. Nevada Crane on Tuesday, June 16th at 3:40pm. Contact information: [address] [place] [facility] [address] [phone]        Dr. Silva Bandy Follow up.   Why:  appt June 23rd at 10:30 Contact information: [phone]          The results of significant diagnostics from this hospitalization (including imaging, microbiology, ancillary and laboratory) are listed below for reference.    Significant Diagnostic Studies: US Renal  Result Date: 05/25/2019 CLINICAL DATA:  Acute kidney injury. EXAM: RENAL / URINARY TRACT ULTRASOUND COMPLETE COMPARISON:  Ultrasound of December 18, 2018. FINDINGS: Right Kidney: Renal measurements: 15.1 x 7.7 x 6.3 cm = volume: 380 mL . Echogenicity within normal limits. No mass or hydronephrosis visualized. Left Kidney: Renal measurements: 12.6 x 6.5 x 6.4 cm = volume: 270 mL. Echogenicity within normal limits. No mass or hydronephrosis visualized. Bladder: Appears normal for degree of bladder distention. Ureteral jets are not visualized. IMPRESSION: No significant renal abnormality is noted. Electronically Signed   By: Marijo Conception M.D.   On: 05/25/2019 16:04   Nm Pet Image Initial (pi) Whole Body  Result Date: 05/12/2019 CLINICAL DATA:  Initial treatment strategy for multiple myeloma. EXAM: NUCLEAR MEDICINE PET WHOLE BODY TECHNIQUE: 10.0 mCi F-18 FDG was injected intravenously. Full-ring PET imaging was performed from the skull base to thigh after the radiotracer. CT data was obtained and used for attenuation correction  and anatomic localization. Fasting blood glucose: 198 mg/dl COMPARISON:  CT chest abdomen pelvis 04/25/2019. FINDINGS: Mediastinal blood pool activity: SUV max 3.9 HEAD/NECK: Mildly asymmetric uptake within the left nasopharynx, without a CT correlate. Otherwise, abnormal hypermetabolism in the head/neck. Incidental CT findings: None. CHEST: No hypermetabolic mediastinal, hilar or axillary lymph nodes. No hypermetabolic pulmonary nodules. Incidental CT findings: Atherosclerotic calcification of the aorta and coronary arteries. Heart is enlarged. No pericardial or pleural effusion. There may be a few scattered millimetric pulmonary nodules which are distorted due to respiratory motion and expiratory phase imaging. ABDOMEN/PELVIS: No abnormal hypermetabolism in the liver, adrenal glands, spleen or pancreas. No hypermetabolic pathologically enlarged lymph nodes. Incidental CT findings: Liver, adrenal glands and right kidney are unremarkable. Tiny stone in the lower pole left kidney. Spleen, pancreas, stomach and bowel are otherwise unremarkable. Cholecystectomy. Uterus is visualized. There may be bladder wall thickening but the bladder is poorly distended.  SKELETON: Focal uptake in anterior right ribs are associated with probable old fractures. Mild hypermetabolism associated with an L2 vertebral body augmentation. Incidental CT findings: L2 vertebral body augmentation. A 12 mm lytic lesion in the left aspect of S1 (series 4, image 181) is again seen and without definitive hypermetabolism. Subtle lysis within the inferior endplate of L5 is better seen on the comparison exam. EXTREMITIES: No abnormal hypermetabolism. Incidental CT findings: There is focal atrophy of the right calf musculature. IMPRESSION: 1. Patchy hypermetabolism throughout the visualized osseous structures. L5 and S1 lucencies do not appear focally hypermetabolic. 2. Left renal stone. 3. Aortic atherosclerosis (ICD10-170.0). Coronary artery  calcification. Electronically Signed   By: Lorin Picket M.D.   On: 05/12/2019 09:10   Ct Biopsy  Result Date: 05/04/2019 INDICATION: 68 year old with multiple myeloma.  Request for bone marrow biopsy. EXAM: CT GUIDED BONE MARROW ASPIRATES AND BIOPSY Physician: Stephan Minister. Anselm Pancoast, MD MEDICATIONS: None. ANESTHESIA/SEDATION: Fentanyl 100 mcg IV; Versed 4.0 mg IV Moderate Sedation Time:  20 minutes The patient was continuously monitored during the procedure by the interventional radiology nurse under my direct supervision. COMPLICATIONS: None immediate. PROCEDURE: The procedure was explained to the patient. The risks and benefits of the procedure were discussed and the patient's questions were addressed. Informed consent was obtained from the patient. The patient was placed prone on CT table. Images of the pelvis were obtained. The right side of back was prepped and draped in sterile fashion. The skin and right posterior ilium were anesthetized with 1% lidocaine. 11 gauge bone needle was directed into the right ilium with CT guidance. No aspirate material could be obtained. Therefore, 2 adequate sized core biopsies were obtained. Bandage placed over the puncture site. IMPRESSION: CT-guided bone marrow core biopsy. No aspirate material could be obtained. Electronically Signed   By: Markus Daft M.D.   On: 05/04/2019 13:16   Ct Bone Marrow Biopsy & Aspiration  Result Date: 05/04/2019 INDICATION: 68 year old with multiple myeloma.  Request for bone marrow biopsy. EXAM: CT GUIDED BONE MARROW ASPIRATES AND BIOPSY Physician: Stephan Minister. Anselm Pancoast, MD MEDICATIONS: None. ANESTHESIA/SEDATION: Fentanyl 100 mcg IV; Versed 4.0 mg IV Moderate Sedation Time:  20 minutes The patient was continuously monitored during the procedure by the interventional radiology nurse under my direct supervision. COMPLICATIONS: None immediate. PROCEDURE: The procedure was explained to the patient. The risks and benefits of the procedure were discussed and  the patient's questions were addressed. Informed consent was obtained from the patient. The patient was placed prone on CT table. Images of the pelvis were obtained. The right side of back was prepped and draped in sterile fashion. The skin and right posterior ilium were anesthetized with 1% lidocaine. 11 gauge bone needle was directed into the right ilium with CT guidance. No aspirate material could be obtained. Therefore, 2 adequate sized core biopsies were obtained. Bandage placed over the puncture site. IMPRESSION: CT-guided bone marrow core biopsy. No aspirate material could be obtained. Electronically Signed   By: Markus Daft M.D.   On: 05/04/2019 13:16    Microbiology: Recent Results (from the past 240 hour(s))  SARS Coronavirus 2 (CEPHEID - Performed in [place] hospital lab), Hosp Order     Status: None   Collection Time: 05/24/19  2:19 PM  Result Value Ref Range Status   SARS Coronavirus 2 NEGATIVE NEGATIVE Final    Comment: (NOTE) If result is NEGATIVE SARS-CoV-2 target nucleic acids are NOT DETECTED. The SARS-CoV-2 RNA is generally detectable in upper and lower  respiratory specimens during the acute phase of infection. The lowest  concentration of SARS-CoV-2 viral copies this assay can detect is 250  copies / mL. A negative result does not preclude SARS-CoV-2 infection  and should not be used as the sole basis for treatment or other  patient management decisions.  A negative result may occur with  improper specimen collection / handling, submission of specimen other  than nasopharyngeal swab, presence of viral mutation(s) within the  areas targeted by this assay, and inadequate number of viral copies  (<250 copies / mL). A negative result must be combined with clinical  observations, patient history, and epidemiological information. If result is POSITIVE SARS-CoV-2 target nucleic acids are DETECTED. The SARS-CoV-2 RNA is generally detectable in upper and lower  respiratory  specimens dur ing the acute phase of infection.  Positive  results are indicative of active infection with SARS-CoV-2.  Clinical  correlation with patient history and other diagnostic information is  necessary to determine patient infection status.  Positive results do  not rule out bacterial infection or co-infection with other viruses. If result is PRESUMPTIVE POSTIVE SARS-CoV-2 nucleic acids MAY BE PRESENT.   A presumptive positive result was obtained on the submitted specimen  and confirmed on repeat testing.  While 2019 novel coronavirus  (SARS-CoV-2) nucleic acids may be present in the submitted sample  additional confirmatory testing may be necessary for epidemiological  and / or clinical management purposes  to differentiate between  SARS-CoV-2 and other Sarbecovirus currently known to infect humans.  If clinically indicated additional testing with an alternate test  methodology [id number]) is advised. The SARS-CoV-2 RNA is generally  detectable in upper and lower respiratory sp ecimens during the acute  phase of infection. The expected result is Negative. Fact Sheet for Patients:  StrictlyIdeas.no Fact Sheet for Healthcare Providers: BankingDealers.co.za This test is not yet approved or cleared by the [place] FDA and has been authorized for detection and/or diagnosis of SARS-CoV-2 by FDA under an Emergency Use Authorization (EUA).  This EUA will remain in effect (meaning this test can be used) for the duration of the COVID-19 declaration under Section 564(b)(1) of the Act, 21 U.S.C. section 360bbb-3(b)(1), unless the authorization is terminated or revoked sooner. Performed at [facility], [address] [place], [place] [address]   Culture, blood (Routine X 2) w Reflex to ID Panel     Status: None   Collection Time: 05/24/19  3:19 PM  Result Value Ref Range Status   Specimen Description BLOOD RIGHT ANTECUBITAL  Final    Special Requests   Final    BOTTLES DRAWN AEROBIC AND ANAEROBIC Blood Culture adequate volume   Culture   Final    NO GROWTH 5 DAYS Performed at [facility], [address] [place], [place] [address]    Report Status 05/29/2019 FINAL  Final  Culture, blood (Routine X 2) w Reflex to ID Panel     Status: None   Collection Time: 05/24/19  3:19 PM  Result Value Ref Range Status   Specimen Description BLOOD BLOOD RIGHT WRIST  Final   Special Requests   Final    BOTTLES DRAWN AEROBIC AND ANAEROBIC Blood Culture adequate volume   Culture   Final    NO GROWTH 5 DAYS Performed at [facility], [address], [place], [place] [address]    Report Status 05/29/2019 FINAL  Final     Labs: Basic Metabolic Panel: Recent Labs  Lab 05/28/19 0656 05/29/19 0718 05/30/19 0900  05/31/19 0613 06/01/19 0511  NA 140 142 142 142 142  K 5.0 4.5 4.4 4.1 4.1  CL 113* 115* 110 112* 110  CO2 20* 21* 20* 23 23  GLUCOSE 93 94 114* 129* 116*  BUN 26* 24* 22 23 23   CREATININE 4.31* 3.97* 3.63* 3.30* 2.96*  CALCIUM 7.9* 8.1* 8.1* 7.8* 8.0*  PHOS 4.4 4.5 5.0* 4.9* 4.9*   Liver Function Tests: Recent Labs  Lab 05/26/19 0454 05/27/19 0511 05/28/19 0656 05/29/19 0718 05/30/19 0900 05/31/19 0613 06/01/19 0511  AST 19 17  --   --   --   --   --   ALT 37 35  --   --   --   --   --   ALKPHOS 101 110  --   --   --   --   --   BILITOT 0.7 0.7  --   --   --   --   --   PROT 5.9* 6.0*  --   --   --   --   --   ALBUMIN 2.4* 2.4* 2.4* 2.5* 2.5* 2.6* 2.6*   CBC: Recent Labs  Lab 05/28/19 0656 05/29/19 0718 05/30/19 0634 05/31/19 0613 06/01/19 0511  WBC 1.4* 2.9* 3.2* 2.8* 2.1*  NEUTROABS 0.7* 1.8 2.1 1.8 1.1*  HGB 6.6* 8.7* 8.8* 8.7* 8.7*  HCT 19.9* 27.5* 27.1* 27.4* 26.5*  MCV 100.5* 98.6 98.5 98.9 96.7  PLT 37* 40* 43* 51* 58*   Cardiac Enzymes: Recent Labs  Lab 05/26/19 0454  CKTOTAL 137   CBG: Recent Labs  Lab 05/31/19 1703 05/31/19 2120 06/01/19 0402 06/01/19 0723 06/01/19 1102   GLUCAP 118* 158* 107* 102* 117*    Signed:  Barton Dubois MD.  [facility] 06/01/2019, 2:14 PM

## 2019-06-01 NOTE — Progress Notes (Signed)
IV removed, d/c instructions reviewed with patient, verbalized understanding. Stable patient to be transported to private vehicle via wheelchair.

## 2019-06-01 NOTE — Care Management Important Message (Signed)
Important Message  Patient Details  Name: Vicki Moon MRN: [id number] Date of Birth: 23-Aug-1951   Medicare Important Message Given:  Yes    Tommy Medal 06/01/2019, 12:58 PM

## 2019-06-02 DIAGNOSIS — B9562 Methicillin resistant Staphylococcus aureus infection as the cause of diseases classified elsewhere: Secondary | ICD-10-CM | POA: Diagnosis not present

## 2019-06-02 NOTE — Telephone Encounter (Signed)
Call received from patient stating that she was discharged from the hospital on 06/01/19 and is needing a new prescription for Revlimid.  Call placed to [facility] and they stated they will reach out to her today for delivery.

## 2019-06-06 DIAGNOSIS — C7951 Secondary malignant neoplasm of bone: Secondary | ICD-10-CM | POA: Diagnosis not present

## 2019-06-06 DIAGNOSIS — Z510 Encounter for antineoplastic radiation therapy: Secondary | ICD-10-CM | POA: Diagnosis not present

## 2019-06-06 DIAGNOSIS — C9000 Multiple myeloma not having achieved remission: Secondary | ICD-10-CM | POA: Diagnosis not present

## 2019-06-07 ENCOUNTER — Emergency Department (HOSPITAL_COMMUNITY): Payer: Medicare Other

## 2019-06-07 ENCOUNTER — Encounter (HOSPITAL_COMMUNITY): Payer: Self-pay | Admitting: *Deleted

## 2019-06-07 ENCOUNTER — Emergency Department (HOSPITAL_COMMUNITY)
Admission: EM | Admit: 2019-06-07 | Discharge: 2019-06-07 | Disposition: A | Discharge status: Home / Self Care | Payer: Medicare Other | Attending: Emergency Medicine | Admitting: Emergency Medicine

## 2019-06-07 ENCOUNTER — Other Ambulatory Visit: Payer: Self-pay

## 2019-06-07 ENCOUNTER — Telehealth: Payer: Self-pay | Admitting: *Deleted

## 2019-06-07 DIAGNOSIS — Z87891 Personal history of nicotine dependence: Secondary | ICD-10-CM | POA: Insufficient documentation

## 2019-06-07 DIAGNOSIS — K5792 Diverticulitis of intestine, part unspecified, without perforation or abscess without bleeding: Secondary | ICD-10-CM | POA: Diagnosis not present

## 2019-06-07 DIAGNOSIS — Z8579 Personal history of other malignant neoplasms of lymphoid, hematopoietic and related tissues: Secondary | ICD-10-CM | POA: Diagnosis not present

## 2019-06-07 DIAGNOSIS — R197 Diarrhea, unspecified: Secondary | ICD-10-CM | POA: Diagnosis not present

## 2019-06-07 DIAGNOSIS — R1110 Vomiting, unspecified: Secondary | ICD-10-CM

## 2019-06-07 DIAGNOSIS — Z794 Long term (current) use of insulin: Secondary | ICD-10-CM | POA: Insufficient documentation

## 2019-06-07 DIAGNOSIS — I10 Essential (primary) hypertension: Secondary | ICD-10-CM | POA: Diagnosis not present

## 2019-06-07 DIAGNOSIS — Z79899 Other long term (current) drug therapy: Secondary | ICD-10-CM | POA: Diagnosis not present

## 2019-06-07 DIAGNOSIS — K5732 Diverticulitis of large intestine without perforation or abscess without bleeding: Secondary | ICD-10-CM | POA: Diagnosis not present

## 2019-06-07 DIAGNOSIS — E119 Type 2 diabetes mellitus without complications: Secondary | ICD-10-CM | POA: Insufficient documentation

## 2019-06-07 DIAGNOSIS — I700 Atherosclerosis of aorta: Secondary | ICD-10-CM | POA: Diagnosis not present

## 2019-06-07 DIAGNOSIS — N200 Calculus of kidney: Secondary | ICD-10-CM | POA: Diagnosis not present

## 2019-06-07 DIAGNOSIS — Z981 Arthrodesis status: Secondary | ICD-10-CM | POA: Diagnosis not present

## 2019-06-07 LAB — DIFFERENTIAL
Abs Immature Granulocytes: 0.02 10*3/uL (ref 0.00–0.07)
Basophils Absolute: 0 10*3/uL (ref 0.0–0.1)
Basophils Relative: 1 %
Eosinophils Absolute: 0.1 10*3/uL (ref 0.0–0.5)
Eosinophils Relative: 5 %
Immature Granulocytes: 1 %
Lymphocytes Relative: 20 %
Lymphs Abs: 0.3 10*3/uL — ABNORMAL LOW (ref 0.7–4.0)
Monocytes Absolute: 0.1 10*3/uL (ref 0.1–1.0)
Monocytes Relative: 7 %
Neutro Abs: 1.1 10*3/uL — ABNORMAL LOW (ref 1.7–7.7)
Neutrophils Relative %: 66 %

## 2019-06-07 LAB — COMPREHENSIVE METABOLIC PANEL
ALT: 22 U/L (ref 0–44)
AST: 16 U/L (ref 15–41)
Albumin: 3.1 g/dL — ABNORMAL LOW (ref 3.5–5.0)
Alkaline Phosphatase: 143 U/L — ABNORMAL HIGH (ref 38–126)
Anion gap: 8 (ref 5–15)
BUN: 19 mg/dL (ref 8–23)
CO2: 26 mmol/L (ref 22–32)
Calcium: 8.7 mg/dL — ABNORMAL LOW (ref 8.9–10.3)
Chloride: 108 mmol/L (ref 98–111)
Creatinine, Ser: 1.56 mg/dL — ABNORMAL HIGH (ref 0.44–1.00)
GFR calc Af Amer: 39 mL/min — ABNORMAL LOW (ref >60)
GFR calc non Af Amer: 33 mL/min — ABNORMAL LOW (ref >60)
Glucose, Bld: 134 mg/dL — ABNORMAL HIGH (ref 70–99)
Potassium: 3.1 mmol/L — ABNORMAL LOW (ref 3.5–5.1)
Sodium: 142 mmol/L (ref 135–145)
Total Bilirubin: 0.9 mg/dL (ref 0.3–1.2)
Total Protein: 7.2 g/dL (ref 6.5–8.1)

## 2019-06-07 LAB — URINALYSIS, ROUTINE W REFLEX MICROSCOPIC
Bilirubin Urine: NEGATIVE
Glucose, UA: NEGATIVE mg/dL
Hgb urine dipstick: NEGATIVE
Ketones, ur: NEGATIVE mg/dL
Leukocytes,Ua: NEGATIVE
Nitrite: NEGATIVE
Protein, ur: NEGATIVE mg/dL
Specific Gravity, Urine: 1.011 (ref 1.005–1.030)
pH: 6 (ref 5.0–8.0)

## 2019-06-07 LAB — CBC
HCT: 30.3 % — ABNORMAL LOW (ref 36.0–46.0)
Hemoglobin: 10.2 g/dL — ABNORMAL LOW (ref 12.0–15.0)
MCH: 31.7 pg (ref 26.0–34.0)
MCHC: 33.7 g/dL (ref 30.0–36.0)
MCV: 94.1 fL (ref 80.0–100.0)
Platelets: 92 10*3/uL — ABNORMAL LOW (ref 150–400)
RBC: 3.22 MIL/uL — ABNORMAL LOW (ref 3.87–5.11)
RDW: 13.9 % (ref 11.5–15.5)
WBC: 1.8 10*3/uL — ABNORMAL LOW (ref 4.0–10.5)
nRBC: 0 % (ref 0.0–0.2)

## 2019-06-07 LAB — LIPASE, BLOOD: Lipase: 22 U/L (ref 11–51)

## 2019-06-07 MED ORDER — SODIUM CHLORIDE 0.9% FLUSH
3.0000 mL | Freq: Once | INTRAVENOUS | Status: AC
  Administered 2019-06-07: 3 mL via INTRAVENOUS

## 2019-06-07 MED ORDER — METRONIDAZOLE IN NACL 5-0.79 MG/ML-% IV SOLN
500.0000 mg | Freq: Once | INTRAVENOUS | Status: AC
  Administered 2019-06-07: 500 mg via INTRAVENOUS
  Filled 2019-06-07: qty 100

## 2019-06-07 MED ORDER — SODIUM CHLORIDE 0.9 % IV BOLUS
500.0000 mL | Freq: Once | INTRAVENOUS | Status: AC
  Administered 2019-06-07: 500 mL via INTRAVENOUS

## 2019-06-07 MED ORDER — ACETAMINOPHEN 325 MG PO TABS
650.0000 mg | ORAL_TABLET | Freq: Once | ORAL | Status: AC
  Administered 2019-06-07: 650 mg via ORAL
  Filled 2019-06-07: qty 2

## 2019-06-07 MED ORDER — IOHEXOL 300 MG/ML  SOLN
75.0000 mL | Freq: Once | INTRAMUSCULAR | Status: AC | PRN
  Administered 2019-06-07: 75 mL via INTRAVENOUS

## 2019-06-07 MED ORDER — METRONIDAZOLE 500 MG PO TABS: 500.0000 mg | ORAL_TABLET | Freq: Three times a day (TID) | ORAL | 0 refills | Status: DC

## 2019-06-07 MED ORDER — POTASSIUM CHLORIDE CRYS ER 20 MEQ PO TBCR
40.0000 meq | EXTENDED_RELEASE_TABLET | Freq: Once | ORAL | Status: AC
  Administered 2019-06-07: 40 meq via ORAL
  Filled 2019-06-07: qty 2

## 2019-06-07 MED ORDER — SODIUM CHLORIDE 0.9 % IV SOLN: INTRAVENOUS | Status: DC

## 2019-06-07 MED ORDER — CIPROFLOXACIN HCL 500 MG PO TABS: 500.0000 mg | ORAL_TABLET | Freq: Two times a day (BID) | ORAL | 0 refills | Status: DC

## 2019-06-07 MED ORDER — CIPROFLOXACIN IN D5W 400 MG/200ML IV SOLN
400.0000 mg | Freq: Once | INTRAVENOUS | Status: AC
  Administered 2019-06-07: 400 mg via INTRAVENOUS
  Filled 2019-06-07: qty 200

## 2019-06-07 NOTE — Discharge Instructions (Addendum)
Follow-up with Dr. [name] or 1 of his assistants in the next 2 to 3 days.  Call their office tomorrow and tell them you are in the emergency department and the physician there wanted to get rechecked.  Also follow-up with your family doctor next week and return sooner if symptoms get worse

## 2019-06-07 NOTE — Progress Notes (Signed)
Pt removed libre freestyle device from upper posterior humerus prior to CT imaging and threw away, Those devices will no perform accurately if radiated.  She was informed by CT Techs to put a new one on after CT Scan

## 2019-06-07 NOTE — ED Triage Notes (Signed)
Pt c/o diarrhea and vomiting that started at 0900 this morning. Pt currently undergoing radiation treatment for myeloma. Pt currently on antibiotics to treat several boils that she has.

## 2019-06-07 NOTE — Telephone Encounter (Signed)
I called pt to scheduled a f/u from [facility] with Dr. Laurence Ferrari, Vicki Moon states she is doing great has had no pain since the procedure. She doesn't feel she needs a f/u. Cathren Harsh

## 2019-06-07 NOTE — ED Provider Notes (Signed)
CT scan shows diverticulitis.  Patient nontoxic.  She is neutropenic.  I spoke with oncology and they felt that since her absolute neutrophil count is 1.1.  Patient will be placed on Cipro and Flagyl she has nausea medicine at home she is instructed to call her oncologist tomorrow morning and get an appointment this week.  She is also going to see her family doctor this week to check on her diverticulitis   Milton Ferguson, MD 06/07/19 [phone]

## 2019-06-07 NOTE — ED Provider Notes (Signed)
[facility] EMERGENCY DEPARTMENT Provider Note   CSN: [id number] Arrival date & time: 06/07/19  1238     History   Chief Complaint Chief Complaint  Patient presents with  . Emesis    HPI Vicki Moon is a 68 y.o. female.     Patient with a history of multiple myeloma.  Patient also has diabetes.  Patient followed by hematology oncology.  Patient with recent admission on June 2 for pancytopenia hypokalemia and acute kidney injury.  Also patient had multiple skin abscesses due to MRSA.  And there was concerns for disseminated MRSA infection.  Patient started on antibiotics for that.  Patient is still taking the antibiotics.  Patient is taking doxycycline.  Patient was just discharged on June 10.  Patient started with the vomiting and diarrhea 9 this morning.  Multiple episodes.  Patient also associated with abdominal pain.  Patient is also undergoing radiation treatment for the myeloma.  Patient denies any vomiting of blood or blood in the bowel movements.     Past Medical History:  Diagnosis Date  . Diabetes mellitus without complication (HCC)    Type 2 IDDM x 5 years  . Glaucoma   . Goals of care, counseling/discussion 04/29/2019  . Herniated lumbar intervertebral disc 06/2014  . Multiple myeloma ([place]) 04/29/2019  . PONV (postoperative nausea and vomiting)     Patient Active Problem List   Diagnosis Date Noted  . Hypokalemia   . Furunculosis 05/25/2019  . 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 Diabetes mellitus without complication ([place]) 76/19/5093  . Multiple Skin abscessed due to MRSA 05/24/2019  . Pancytopenia ([place]) 05/24/2019  . Multiple myeloma ([place]) 04/29/2019  . Goals of care, counseling/discussion 04/29/2019  . Pain in abdominal muscle of right flank 03/08/2019  . Abscess of right axilla   . Cellulitis of axilla, right   . Cellulitis 12/17/2018  . AKI (acute kidney injury) ([place]) 12/17/2018  . Anemia 12/17/2018  . Type 2 diabetes mellitus ([place])  12/17/2018  . Depression 12/17/2018  . Trochanteric bursitis, right hip 08/12/2018  . Post-menopausal 01/19/2018  . Current smoker 01/19/2018  . Mixed hyperlipidemia 07/13/2017  . Class 2 severe obesity due to excess calories with serious comorbidity and body mass index (BMI) of 37.0 to 37.9 in adult ([place]) 07/13/2017  . Hypercortisolemia ([place]) 01/20/2017  . Excessive weight gain 01/20/2017  . Generalized abdominal pain 01/20/2017  . Uncontrolled type 2 diabetes mellitus with complication, with long-term current use of insulin ([place]) 12/20/2015  . Essential hypertension, benign 12/20/2015  . Vitamin D deficiency 12/20/2015  . Rectal bleeding 05/29/2015  . HNP (herniated nucleus pulposus), lumbar 07/12/2014    Class: Diagnosis of    Past Surgical History:  Procedure Laterality Date  . CHOLECYSTECTOMY    . COLONOSCOPY N/A 06/28/2015   Procedure: COLONOSCOPY;  Surgeon: Rogene Houston, MD;  Location: [facility];  Service: Endoscopy;  Laterality: N/A;  155  . EYE SURGERY    . IR BONE TUMOR(S)RF ABLATION  04/28/2019  . IR KYPHO LUMBAR INC FX REDUCE BONE BX UNI/BIL CANNULATION INC/IMAGING  04/28/2019  . IR RADIOLOGIST EVAL & MGMT  04/22/2019  . LUMBAR LAMINECTOMY Right 07/12/2014   Procedure: LUMBAR FIVE TO SACRAL ONE MICRODISCECTOMY;  Surgeon: Marybelle Killings, MD;  Location: [place];  Service: Orthopedics;  Laterality: Right;  . TUBAL LIGATION    . VITRECTOMY 25 GAUGE WITH SCLERAL BUCKLE Left 07/29/2017   Procedure: RETINAL DETACHMENT REPAIR LEFT EYE WITH PARSPLANA VITRECTOMY, AIR FLUID EXCHANGE,  ENDO LASTER, DRAINAGE OF SUBRETINAL FLUID,ENDOLASER PPV /25 GAUGE;  Surgeon: Jalene Mullet, MD;  Location: [place];  Service: Ophthalmology;  Laterality: Left;     OB History   No obstetric history on file.      Home Medications    Prior to Admission medications   Medication Sig Start Date End Date Taking? Authorizing Provider  ALPRAZolam Duanne Moron) 0.5 MG tablet Take 0.5 mg by mouth daily. 05/10/19    [provider]  aspirin 325 MG tablet Take 325 mg by mouth daily.    [provider]  atorvastatin (LIPITOR) 10 MG tablet Take 10 mg by mouth daily. 08/17/18   [provider]  B-D ULTRAFINE III SHORT PEN 31G X 8 MM MISC USE ONE PEN NEEDLE 4 TIMES DAILY 11/19/17   Cassandria Anger, MD  Continuous Blood Gluc Sensor (FREESTYLE LIBRE SENSOR SYSTEM) MISC Use one sensor every 10 days. 10/14/17   Cassandria Anger, MD  doxycycline (VIBRAMYCIN) 100 MG capsule Take 1 capsule (100 mg total) by mouth 2 (two) times daily. 06/01/19   Barton Dubois, MD  famciclovir (FAMVIR) 250 MG tablet Take 1 tablet (250 mg total) by mouth daily. Patient not taking: Reported on 05/25/2019 04/29/19   [name], MD  glipiZIDE (GLUCOTROL XL) 10 MG 24 hr tablet TAKE 1 TABLET BY MOUTH ONCE DAILY FOR SUGARS 04/04/19   [provider]  HYDROcodone-acetaminophen (NORCO/VICODIN) 5-325 MG tablet Take 1 tablet by mouth every 6 (six) hours as needed for moderate pain. Patient not taking: Reported on 05/25/2019 04/22/19   [name], MD  insulin glargine (LANTUS) 100 UNIT/ML injection Inject 30 Units into the skin at bedtime.    [provider]  insulin lispro (HUMALOG KWIKPEN) 100 UNIT/ML KiwkPen Inject 0.22-0.28 mLs (22-28 Units total) into the skin 3 (three) times daily with meals. Patient not taking: Reported on 05/25/2019 01/19/18   Cassandria Anger, MD  latanoprost (XALATAN) 0.005 % ophthalmic solution Place 1 drop into both eyes at bedtime.    [provider]  lenalidomide (REVLIMID) 25 MG capsule Take 1 pill daily at bedtime for 21 days on and 7 days off. RCVE#[id number] 05/23/19   [name], MD  NOVOLOG 100 UNIT/ML injection INJECT 35 UNITS SUBCUTANEOUSLY WITH MEALS 05/06/19   [provider]  ondansetron (ZOFRAN) 8 MG tablet Take 1 tablet (8 mg total) by mouth 2 (two) times daily as needed (Nausea or vomiting). 05/17/19   [name], MD  ONETOUCH  VERIO test strip USE 1 STRIP TO CHECK GLUCOSE 4 TIMES DAILY 09/29/17   Cassandria Anger, MD  PARoxetine (PAXIL) 30 MG tablet Take 30 mg by mouth every morning. 07/12/18   [provider]  prochlorperazine (COMPAZINE) 10 MG tablet Take 1 tablet (10 mg total) by mouth every 6 (six) hours as needed (Nausea or vomiting). 05/17/19   [name], MD    Family History Family History  Problem Relation Age of Onset  . Hypertension Mother   . Stroke Mother   . Cancer Father        brain tumor  . Diabetes Father     Social History Social History   Tobacco Use  . Smoking status: Former Smoker    Packs/day: 1.00    Years: 10.00    Pack years: 10.00    Types: Cigarettes    Quit date: 02/20/2019    Years since quitting: 0.2  . Smokeless tobacco: Never Used  . Tobacco comment:  1/2 pack a day since age 61  Substance Use Topics  . Alcohol use: No    Alcohol/week: 0.0 standard drinks  . Drug use: No     Allergies   Metformin   Review of Systems Review of Systems  Constitutional: Negative for chills and fever.  HENT: Negative for congestion, rhinorrhea and sore throat.   Eyes: Negative for visual disturbance.  Respiratory: Negative for cough and shortness of breath.   Cardiovascular: Negative for chest pain and leg swelling.  Gastrointestinal: Positive for abdominal pain, diarrhea and vomiting. Negative for blood in stool and nausea.  Genitourinary: Negative for dysuria.  Musculoskeletal: Negative for back pain and neck pain.  Skin: Positive for wound. Negative for rash.  Allergic/Immunologic: Positive for immunocompromised state.  Neurological: Negative for dizziness, light-headedness and headaches.  Hematological: Does not bruise/bleed easily.  Psychiatric/Behavioral: Negative for confusion.     Physical Exam Updated Vital Signs BP (!) 152/57   Pulse 60   Temp 97.7 F (36.5 C) (Oral)   Resp 18   Ht 1.575 m (5' 2" )   Wt 88 kg   SpO2 97%   BMI 35.48 kg/m    Physical Exam Vitals signs and nursing note reviewed.  Constitutional:      General: She is not in acute distress.    Appearance: Normal appearance. She is well-developed. She is not ill-appearing.  HENT:     Head: Normocephalic and atraumatic.  Eyes:     Extraocular Movements: Extraocular movements intact.     Conjunctiva/sclera: Conjunctivae normal.     Pupils: Pupils are equal, round, and reactive to light.  Neck:     Musculoskeletal: Normal range of motion and neck supple. No neck rigidity.  Cardiovascular:     Rate and Rhythm: Normal rate and regular rhythm.     Heart sounds: No murmur.  Pulmonary:     Effort: Pulmonary effort is normal. No respiratory distress.     Breath sounds: Normal breath sounds.  Abdominal:     General: There is no distension.     Palpations: Abdomen is soft.     Tenderness: There is no abdominal tenderness.  Skin:    General: Skin is warm and dry.     Capillary Refill: Capillary refill takes less than 2 seconds.     Findings: Lesion present.     Comments: Consistent with some boils no acute infection.  Neurological:     General: No focal deficit present.     Mental Status: She is alert and oriented to person, place, and time.      ED Treatments / Results  Labs (all labs ordered are listed, but only abnormal results are displayed) Labs Reviewed  COMPREHENSIVE METABOLIC PANEL - Abnormal; Notable for the following components:      Result Value   Potassium 3.1 (*)    Glucose, Bld 134 (*)    Creatinine, Ser 1.56 (*)    Calcium 8.7 (*)    Albumin 3.1 (*)    Alkaline Phosphatase 143 (*)    GFR calc non Af Amer 33 (*)    GFR calc Af Amer 39 (*)    All other components within normal limits  CBC - Abnormal; Notable for the following components:   WBC 1.8 (*)    RBC 3.22 (*)    Hemoglobin 10.2 (*)    HCT 30.3 (*)    Platelets 92 (*)    All other components within normal limits  URINALYSIS, ROUTINE W REFLEX MICROSCOPIC -  Abnormal; Notable  for the following components:   APPearance HAZY (*)    All other components within normal limits  C DIFFICILE QUICK SCREEN W PCR REFLEX  GASTROINTESTINAL PANEL BY PCR, STOOL (REPLACES STOOL CULTURE)  LIPASE, BLOOD    EKG    Radiology No results found.  Procedures Procedures (including critical care time)  Medications Ordered in ED Medications  0.9 %  sodium chloride infusion (has no administration in time range)  sodium chloride flush (NS) 0.9 % injection 3 mL (3 mLs Intravenous Given 06/07/19 1332)  sodium chloride 0.9 % bolus 500 mL (500 mLs Intravenous New Bag/Given 06/07/19 1445)  iohexol (OMNIPAQUE) 300 MG/ML solution 75 mL (75 mLs Intravenous Contrast Given 06/07/19 1531)     Initial Impression / Assessment and Plan / ED Course  I have reviewed the triage vital signs and the nursing notes.  Pertinent labs & imaging results that were available during my care of the patient were reviewed by me and considered in my medical decision making (see chart for details).        Patient is nontoxic no acute distress.  However some concern for the acute onset of the vomiting and diarrhea.  And the abdominal pain.  Patient certainly set up for C. difficile based on the being on antibiotics recently.  We will go ahead and get GI panel for possible bacterial infection causing the diarrhea.  Also will check for C. difficile.  Will do CT scan abdomen to make sure there is no significant abnormalities.  Patient still has a pancytopenia.  But but not significantly changed from June 10.  The white count is down a little bit lower.  Absolute neutrophil count is at 1.1.  Electrolytes without any significant abnormalities renal functions actually improved but not normal.  Still has some mild hypokalemia but at 3.1 not critical not requiring admission.  Disposition will be based on the CT scan of the abdomen and has no acute findings patient can probably be discharged home.  1 could consider  contacting hematology oncology to update them on where she is at.  Clinically no concern for COVID-19.  Final Clinical Impressions(s) / ED Diagnoses   Final diagnoses:  Vomiting and diarrhea    ED Discharge Orders    None       Fredia Sorrow, MD 06/07/19 1541

## 2019-06-08 ENCOUNTER — Other Ambulatory Visit: Payer: Self-pay | Admitting: Hematology & Oncology

## 2019-06-08 DIAGNOSIS — Z510 Encounter for antineoplastic radiation therapy: Secondary | ICD-10-CM | POA: Diagnosis not present

## 2019-06-08 DIAGNOSIS — C9000 Multiple myeloma not having achieved remission: Secondary | ICD-10-CM | POA: Diagnosis not present

## 2019-06-09 DIAGNOSIS — Z510 Encounter for antineoplastic radiation therapy: Secondary | ICD-10-CM | POA: Diagnosis not present

## 2019-06-09 DIAGNOSIS — C9000 Multiple myeloma not having achieved remission: Secondary | ICD-10-CM | POA: Diagnosis not present

## 2019-06-10 DIAGNOSIS — C9000 Multiple myeloma not having achieved remission: Secondary | ICD-10-CM | POA: Diagnosis not present

## 2019-06-10 DIAGNOSIS — Z510 Encounter for antineoplastic radiation therapy: Secondary | ICD-10-CM | POA: Diagnosis not present

## 2019-06-13 DIAGNOSIS — Z510 Encounter for antineoplastic radiation therapy: Secondary | ICD-10-CM | POA: Diagnosis not present

## 2019-06-13 DIAGNOSIS — C9000 Multiple myeloma not having achieved remission: Secondary | ICD-10-CM | POA: Diagnosis not present

## 2019-06-14 ENCOUNTER — Other Ambulatory Visit: Payer: Self-pay

## 2019-06-14 ENCOUNTER — Inpatient Hospital Stay (HOSPITAL_BASED_OUTPATIENT_CLINIC_OR_DEPARTMENT_OTHER): Payer: Medicare Other | Admitting: Hematology & Oncology

## 2019-06-14 ENCOUNTER — Telehealth: Payer: Self-pay | Admitting: *Deleted

## 2019-06-14 ENCOUNTER — Other Ambulatory Visit: Payer: Medicare Other

## 2019-06-14 ENCOUNTER — Inpatient Hospital Stay: Payer: Medicare Other

## 2019-06-14 VITALS — BP 138/51 | HR 81 | Temp 98.9°F | Resp 20 | Wt 187.8 lb

## 2019-06-14 VITALS — BP 166/59 | HR 69

## 2019-06-14 DIAGNOSIS — C9000 Multiple myeloma not having achieved remission: Secondary | ICD-10-CM

## 2019-06-14 DIAGNOSIS — L02214 Cutaneous abscess of groin: Secondary | ICD-10-CM

## 2019-06-14 DIAGNOSIS — E876 Hypokalemia: Secondary | ICD-10-CM | POA: Diagnosis not present

## 2019-06-14 DIAGNOSIS — C9002 Multiple myeloma in relapse: Secondary | ICD-10-CM

## 2019-06-14 DIAGNOSIS — D61818 Other pancytopenia: Secondary | ICD-10-CM | POA: Diagnosis not present

## 2019-06-14 DIAGNOSIS — Z923 Personal history of irradiation: Secondary | ICD-10-CM | POA: Diagnosis not present

## 2019-06-14 DIAGNOSIS — B9562 Methicillin resistant Staphylococcus aureus infection as the cause of diseases classified elsewhere: Secondary | ICD-10-CM | POA: Diagnosis not present

## 2019-06-14 DIAGNOSIS — Z79899 Other long term (current) drug therapy: Secondary | ICD-10-CM | POA: Diagnosis not present

## 2019-06-14 DIAGNOSIS — Z794 Long term (current) use of insulin: Secondary | ICD-10-CM

## 2019-06-14 LAB — CMP (CANCER CENTER ONLY)
ALT: 20 U/L (ref 0–44)
AST: 13 U/L — ABNORMAL LOW (ref 15–41)
Albumin: 3.7 g/dL (ref 3.5–5.0)
Alkaline Phosphatase: 116 U/L (ref 38–126)
Anion gap: 8 (ref 5–15)
BUN: 16 mg/dL (ref 8–23)
CO2: 29 mmol/L (ref 22–32)
Calcium: 9.2 mg/dL (ref 8.9–10.3)
Chloride: 103 mmol/L (ref 98–111)
Creatinine: 1.46 mg/dL — ABNORMAL HIGH (ref 0.44–1.00)
GFR, Est AFR Am: 42 mL/min — ABNORMAL LOW (ref >60)
GFR, Estimated: 36 mL/min — ABNORMAL LOW (ref >60)
Glucose, Bld: 135 mg/dL — ABNORMAL HIGH (ref 70–99)
Potassium: 2.7 mmol/L — CL (ref 3.5–5.1)
Sodium: 140 mmol/L (ref 135–145)
Total Bilirubin: 0.6 mg/dL (ref 0.3–1.2)
Total Protein: 6.4 g/dL — ABNORMAL LOW (ref 6.5–8.1)

## 2019-06-14 LAB — CBC WITH DIFFERENTIAL (CANCER CENTER ONLY)
Abs Immature Granulocytes: 0 10*3/uL (ref 0.00–0.07)
Basophils Absolute: 0 10*3/uL (ref 0.0–0.1)
Basophils Relative: 1 %
Eosinophils Absolute: 0.1 10*3/uL (ref 0.0–0.5)
Eosinophils Relative: 11 %
HCT: 27.4 % — ABNORMAL LOW (ref 36.0–46.0)
Hemoglobin: 9.3 g/dL — ABNORMAL LOW (ref 12.0–15.0)
Immature Granulocytes: 0 %
Lymphocytes Relative: 27 %
Lymphs Abs: 0.3 10*3/uL — ABNORMAL LOW (ref 0.7–4.0)
MCH: 31.3 pg (ref 26.0–34.0)
MCHC: 33.9 g/dL (ref 30.0–36.0)
MCV: 92.3 fL (ref 80.0–100.0)
Monocytes Absolute: 0.1 10*3/uL (ref 0.1–1.0)
Monocytes Relative: 9 %
Neutro Abs: 0.7 10*3/uL — ABNORMAL LOW (ref 1.7–7.7)
Neutrophils Relative %: 52 %
Platelet Count: 46 10*3/uL — ABNORMAL LOW (ref 150–400)
RBC: 2.97 MIL/uL — ABNORMAL LOW (ref 3.87–5.11)
RDW: 13.6 % (ref 11.5–15.5)
WBC Count: 1.2 10*3/uL — ABNORMAL LOW (ref 4.0–10.5)
nRBC: 0 % (ref 0.0–0.2)

## 2019-06-14 LAB — LACTATE DEHYDROGENASE: LDH: 135 U/L (ref 98–192)

## 2019-06-14 MED ORDER — FLUCONAZOLE 100 MG PO TABS: 100.0000 mg | ORAL_TABLET | Freq: Every day | ORAL | 2 refills | Status: DC

## 2019-06-14 MED ORDER — TBO-FILGRASTIM 480 MCG/0.8ML ~~LOC~~ SOSY
480.0000 ug | PREFILLED_SYRINGE | Freq: Once | SUBCUTANEOUS | Status: DC
  Filled 2019-06-14: qty 0.8

## 2019-06-14 MED ORDER — PEGFILGRASTIM INJECTION 6 MG/0.6ML ~~LOC~~: 6.0000 mg | PREFILLED_SYRINGE | Freq: Once | SUBCUTANEOUS | Status: DC

## 2019-06-14 MED ORDER — TBO-FILGRASTIM 480 MCG/0.8ML ~~LOC~~ SOSY
480.0000 ug | PREFILLED_SYRINGE | Freq: Once | SUBCUTANEOUS | Status: AC
  Administered 2019-06-14: 480 ug via SUBCUTANEOUS
  Filled 2019-06-14: qty 0.8

## 2019-06-14 MED ORDER — SODIUM CHLORIDE 0.9 % IV SOLN
16.0000 mg | Freq: Once | INTRAVENOUS | Status: AC
  Administered 2019-06-14: 16 mg via INTRAVENOUS
  Filled 2019-06-14: qty 8

## 2019-06-14 MED ORDER — SODIUM CHLORIDE 0.9 % IV SOLN
Freq: Once | INTRAVENOUS | Status: AC
  Administered 2019-06-14: 12:00:00 via INTRAVENOUS
  Filled 2019-06-14: qty 250

## 2019-06-14 MED ORDER — FLUCONAZOLE IN SODIUM CHLORIDE 400-0.9 MG/200ML-% IV SOLN: 200.0000 mg | INTRAVENOUS | Status: DC

## 2019-06-14 MED ORDER — POTASSIUM CHLORIDE CRYS ER 20 MEQ PO TBCR
40.0000 meq | EXTENDED_RELEASE_TABLET | Freq: Two times a day (BID) | ORAL | Status: DC
  Administered 2019-06-14: 40 meq via ORAL
  Filled 2019-06-14: qty 2

## 2019-06-14 MED ORDER — POTASSIUM CHLORIDE CRYS ER 20 MEQ PO TBCR: 40.0000 meq | EXTENDED_RELEASE_TABLET | Freq: Every day | ORAL | 0 refills | Status: DC

## 2019-06-14 NOTE — Patient Instructions (Signed)
Dehydration, Adult  Dehydration is a condition in which there is not enough fluid or water in the body. This happens when you lose more fluids than you take in. Important organs, such as the kidneys, brain, and heart, cannot function without a proper amount of fluids. Any loss of fluids from the body can lead to dehydration. Dehydration can range from mild to severe. This condition should be treated right away to prevent it from becoming severe. What are the causes? This condition may be caused by:  Vomiting.  Diarrhea.  Excessive sweating, such as from heat exposure or exercise.  Not drinking enough fluid, especially: ? When ill. ? While doing activity that requires a lot of energy.  Excessive urination.  Fever.  Infection.  Certain medicines, such as medicines that cause the body to lose excess fluid (diuretics).  Inability to access safe drinking water.  Reduced physical ability to get adequate water and food. What increases the risk? This condition is more likely to develop in people:  Who have a poorly controlled long-term (chronic) illness, such as diabetes, heart disease, or kidney disease.  Who are age 65 or older.  Who are disabled.  Who live in a place with high altitude.  Who play endurance sports. What are the signs or symptoms? Symptoms of mild dehydration may include:  Thirst.  Dry lips.  Slightly dry mouth.  Dry, warm skin.  Dizziness. Symptoms of moderate dehydration may include:  Very dry mouth.  Muscle cramps.  Dark urine. Urine may be the color of tea.  Decreased urine production.  Decreased tear production.  Heartbeat that is irregular or faster than normal (palpitations).  Headache.  Light-headedness, especially when you stand up from a sitting position.  Fainting (syncope). Symptoms of severe dehydration may include:  Changes in skin, such as: ? Cold and clammy skin. ? Blotchy (mottled) or pale skin. ? Skin that does  not quickly return to normal after being lightly pinched and released (poor skin turgor).  Changes in body fluids, such as: ? Extreme thirst. ? No tear production. ? Inability to sweat when body temperature is high, such as in hot weather. ? Very little urine production.  Changes in vital signs, such as: ? Weak pulse. ? Pulse that is more than 100 beats a minute when sitting still. ? Rapid breathing. ? Low blood pressure.  Other changes, such as: ? Sunken eyes. ? Cold hands and feet. ? Confusion. ? Lack of energy (lethargy). ? Difficulty waking up from sleep. ? Short-term weight loss. ? Unconsciousness. How is this diagnosed? This condition is diagnosed based on your symptoms and a physical exam. Blood and urine tests may be done to help confirm the diagnosis. How is this treated? Treatment for this condition depends on the severity. Mild or moderate dehydration can often be treated at home. Treatment should be started right away. Do not wait until dehydration becomes severe. Severe dehydration is an emergency and it needs to be treated in a hospital. Treatment for mild dehydration may include:  Drinking more fluids.  Replacing salts and minerals in your blood (electrolytes) that you may have lost. Treatment for moderate dehydration may include:  Drinking an oral rehydration solution (ORS). This is a drink that helps you replace fluids and electrolytes (rehydrate). It can be found at pharmacies and retail stores. Treatment for severe dehydration may include:  Receiving fluids through an IV tube.  Receiving an electrolyte solution through a feeding tube that is passed through your nose and   into your stomach (nasogastric tube, or NG tube).  Correcting any abnormalities in electrolytes.  Treating the underlying cause of dehydration. Follow these instructions at home:  If directed by your health care provider, drink an ORS: ? Make an ORS by following instructions on the  package. ? Start by drinking small amounts, about  cup (120 mL) every 5-10 minutes. ? Slowly increase how much you drink until you have taken the amount recommended by your health care provider.  Drink enough clear fluid to keep your urine clear or pale yellow. If you were told to drink an ORS, finish the ORS first, then start slowly drinking other clear fluids. Drink fluids such as: ? Water. Do not drink only water. Doing that can lead to having too little salt (sodium) in the body (hyponatremia). ? Ice chips. ? Fruit juice that you have added water to (diluted fruit juice). ? Low-calorie sports drinks.  Avoid: ? Alcohol. ? Drinks that contain a lot of sugar. These include high-calorie sports drinks, fruit juice that is not diluted, and soda. ? Caffeine. ? Foods that are greasy or contain a lot of fat or sugar.  Take over-the-counter and prescription medicines only as told by your health care provider.  Do not take sodium tablets. This can lead to having too much sodium in the body (hypernatremia).  Eat foods that contain a healthy balance of electrolytes, such as bananas, oranges, potatoes, tomatoes, and spinach.  Keep all follow-up visits as told by your health care provider. This is important. Contact a health care provider if:  You have abdominal pain that: ? Gets worse. ? Stays in one area (localizes).  You have a rash.  You have a stiff neck.  You are more irritable than usual.  You are sleepier or more difficult to wake up than usual.  You feel weak or dizzy.  You feel very thirsty.  You have urinated only a small amount of very dark urine over 6-8 hours. Get help right away if:  You have symptoms of severe dehydration.  You cannot drink fluids without vomiting.  Your symptoms get worse with treatment.  You have a fever.  You have a severe headache.  You have vomiting or diarrhea that: ? Gets worse. ? Does not go away.  You have blood or green matter  (bile) in your vomit.  You have blood in your stool. This may cause stool to look black and tarry.  You have not urinated in 6-8 hours.  You faint.  Your heart rate while sitting still is over 100 beats a minute.  You have trouble breathing. This information is not intended to replace advice given to you by your health care provider. Make sure you discuss any questions you have with your health care provider. Document Released: 12/08/2005 Document Revised: 07/04/2016 Document Reviewed: 02/01/2016 Elsevier Interactive Patient Education  2019 [employer]  

## 2019-06-14 NOTE — Telephone Encounter (Signed)
Dr. [name] notified of K-2.7.  No new orders received at this time.

## 2019-06-14 NOTE — Progress Notes (Signed)
Hematology and Oncology Follow Up Visit  Vicki Moon [id number] 1951-01-17 68 y.o. 06/14/2019   Principle Diagnosis:   IgG Kappa myeloma -- 1p-, 13q-, 16q- and t(11:14)  Current Therapy:    RVD -- start on 05/09/2019 -- d/c revlimid on 06/14/2019  S/P Kyphoplasty at L2  XRT to lumbar spine -- to be done in [place]  Xgeva 120 mg sq q 3 months -- next dose on 07/2019     Interim History:  Ms. Lady is back for follow-up.  To no surprise, she was back in the hospital at [facility].  She had no other episode of MRSA.  She was then for 8 days.  She is also quite anemic.  She had to be transfused.  I really think that the Revlimid is much too tough for her right now.  I am going to stop the Revlimid.  This really should help her blood counts  It looks like she has some fungal infection on the pinna of her ears.  I will give her some Diflucan.  She is also quite hypokalemic.  She will get potassium also.  She is now finished radiation yet.  I think she has 1 or 2 more days left.  Her pain does seem to be doing better.  I am going to hold her Velcade for right now.  I do still think that she is really a good candidate for treatment.  I know it has worked already but we really have to make some dosage adjustments for her.  Given her marginal tolerance to chemotherapy, I highly doubt that she is going to be a candidate for high-dose chemotherapy and stem cell transplant.  I will have to give her some Neulasta today.  Her white cell count was 1.2.  Her ANC is 0.7.  I know she is trying her really hard.  I just feel bad that she keeps having these issues.  Overall, her performance status is ECOG 2.  Medications:  Current Outpatient Medications:  .  ALPRAZolam (XANAX) 0.5 MG tablet, Take 0.5 mg by mouth daily., Disp: , Rfl:  .  aspirin 325 MG tablet, Take 325 mg by mouth daily., Disp: , Rfl:  .  atorvastatin (LIPITOR) 10 MG tablet, Take 10 mg by mouth daily., Disp: , Rfl: 5 .   B-D ULTRAFINE III SHORT PEN 31G X 8 MM MISC, USE ONE PEN NEEDLE 4 TIMES DAILY, Disp: 100 each, Rfl: 5 .  ciprofloxacin (CIPRO) 500 MG tablet, Take 1 tablet (500 mg total) by mouth 2 (two) times daily. One po bid x 7 days, Disp: 14 tablet, Rfl: 0 .  Continuous Blood Gluc Sensor ([place]) MISC, Use one sensor every 10 days., Disp: 3 each, Rfl: 2 .  doxycycline (VIBRAMYCIN) 100 MG capsule, Take 1 capsule (100 mg total) by mouth 2 (two) times daily., Disp: 10 capsule, Rfl: 0 .  famciclovir (FAMVIR) 250 MG tablet, Take 1 tablet (250 mg total) by mouth daily. (Patient not taking: Reported on 05/25/2019), Disp: 30 tablet, Rfl: 8 .  glipiZIDE (GLUCOTROL XL) 10 MG 24 hr tablet, TAKE 1 TABLET BY MOUTH ONCE DAILY FOR SUGARS, Disp: , Rfl:  .  HYDROcodone-acetaminophen (NORCO/VICODIN) 5-325 MG tablet, Take 1 tablet by mouth every 6 (six) hours as needed for moderate pain. (Patient not taking: Reported on 05/25/2019), Disp: 60 tablet, Rfl: 0 .  insulin glargine (LANTUS) 100 UNIT/ML injection, Inject 30 Units into the skin at bedtime., Disp: , Rfl:  .  insulin lispro (HUMALOG KWIKPEN) 100 UNIT/ML KiwkPen, Inject 0.22-0.28 mLs (22-28 Units total) into the skin 3 (three) times daily with meals. (Patient not taking: Reported on 05/25/2019), Disp: 25 mL, Rfl: 2 .  latanoprost (XALATAN) 0.005 % ophthalmic solution, Place 1 drop into both eyes at bedtime., Disp: , Rfl:  .  lenalidomide (REVLIMID) 25 MG capsule, Take 1 pill daily at bedtime for 21 days on and 7 days off. [id number], Disp: 21 capsule, Rfl: 4 .  metroNIDAZOLE (FLAGYL) 500 MG tablet, Take 1 tablet (500 mg total) by mouth 3 (three) times daily. One po bid x 7 days, Disp: 21 tablet, Rfl: 0 .  NOVOLOG 100 UNIT/ML injection, INJECT 35 UNITS SUBCUTANEOUSLY WITH MEALS, Disp: , Rfl:  .  ondansetron (ZOFRAN) 8 MG tablet, Take 1 tablet (8 mg total) by mouth 2 (two) times daily as needed (Nausea or vomiting)., Disp: 30 tablet, Rfl: 1 .  ONETOUCH VERIO  test strip, USE 1 STRIP TO CHECK GLUCOSE 4 TIMES DAILY, Disp: 150 each, Rfl: 5 .  PARoxetine (PAXIL) 30 MG tablet, Take 30 mg by mouth every morning., Disp: , Rfl: 3 .  prochlorperazine (COMPAZINE) 10 MG tablet, Take 1 tablet (10 mg total) by mouth every 6 (six) hours as needed (Nausea or vomiting)., Disp: 30 tablet, Rfl: 1  Allergies:  Allergies  Allergen Reactions  . Metformin Nausea Only    "Vicki Moon"    Past Medical History, Surgical history, Social history, and Family History were reviewed and updated.  Review of Systems: Review of Systems  Constitutional: Negative.   HENT:  Negative.   Eyes: Negative.   Respiratory: Negative.   Cardiovascular: Negative.   Gastrointestinal: Negative.   Endocrine: Negative.   Genitourinary: Negative.    Musculoskeletal: Positive for back pain.  Skin: Negative.   Neurological: Negative.   Hematological: Negative.   Psychiatric/Behavioral: Negative.     Physical Exam:  weight is 187 lb 12.8 oz (85.2 kg). Her oral temperature is 98.9 F (37.2 C). Her blood pressure is 138/51 (abnormal) and her pulse is 81. Her respiration is 20 and oxygen saturation is 98%.   Wt Readings from Last 3 Encounters:  06/14/19 187 lb 12.8 oz (85.2 kg)  06/07/19 194 lb (88 kg)  06/01/19 199 lb 15.3 oz (90.7 kg)    Physical Exam Vitals signs reviewed.  HENT:     Head: Normocephalic and atraumatic.  Eyes:     Pupils: Pupils are equal, round, and reactive to light.  Neck:     Musculoskeletal: Normal range of motion.  Cardiovascular:     Rate and Rhythm: Normal rate and regular rhythm.     Heart sounds: Normal heart sounds.  Pulmonary:     Effort: Pulmonary effort is normal.     Breath sounds: Normal breath sounds.  Abdominal:     General: Bowel sounds are normal.     Palpations: Abdomen is soft.  Musculoskeletal: Normal range of motion.        General: No tenderness or deformity.  Lymphadenopathy:     Cervical: No cervical adenopathy.  Skin:     General: Skin is warm and dry.     Findings: No erythema or rash.  Neurological:     Mental Status: She is alert and oriented to person, place, and time.  Psychiatric:        Behavior: Behavior normal.        Thought Content: Thought content normal.        Judgment: Judgment normal.  Lab Results  Component Value Date   WBC 1.2 (L) 06/14/2019   HGB 9.3 (L) 06/14/2019   HCT 27.4 (L) 06/14/2019   MCV 92.3 06/14/2019   PLT 46 (L) 06/14/2019     Chemistry      Component Value Date/Time   NA 140 06/14/2019 1043   K 2.7 (LL) 06/14/2019 1043   CL 103 06/14/2019 1043   CO2 29 06/14/2019 1043   BUN 16 06/14/2019 1043   CREATININE 1.46 (H) 06/14/2019 1043   CREATININE 0.65 01/12/2018 1004      Component Value Date/Time   CALCIUM 9.2 06/14/2019 1043   ALKPHOS 116 06/14/2019 1043   AST 13 (L) 06/14/2019 1043   ALT 20 06/14/2019 1043   BILITOT 0.6 06/14/2019 1043       Impression and Plan: Ms. Luhrs is a 68 year old white female.  She has a new diagnosis of IgG kappa myeloma.  I think this is in the high risk group.  I will give her IV fluids today.  I think this will help her.  Again, I am holding her Velcade.  I am stopping the Revlimid.  I am sure that she is still responding nicely.  Her total protein is now down to 6.4.  I think that we do have the "electric" of holding her treatment for right now.  We really need to get her blood counts better and get her overall performance status better.  This is much more complicated than I would have thought for myeloma.  I know that she is in a high risk group by her chromosome studies.  Of note, she does have the t(11:14) translocation so venetoclax could always be considered for this.  I think that she will feel better with the IV fluids.  We will give her some IV antiemetics.  She is some IV Diflucan followed by some oral Diflucan.  She will get potassium.  Again she will finish up her radiation in a day or so.  I  will see her back in 2 weeks.  We spent about 45 minutes with her today.  Again, she has these issues that need to be addressed quickly.  [name], MD 6/23/202011:36 AM

## 2019-06-15 DIAGNOSIS — C7951 Secondary malignant neoplasm of bone: Secondary | ICD-10-CM | POA: Diagnosis not present

## 2019-06-15 DIAGNOSIS — Z510 Encounter for antineoplastic radiation therapy: Secondary | ICD-10-CM | POA: Diagnosis not present

## 2019-06-15 DIAGNOSIS — C9000 Multiple myeloma not having achieved remission: Secondary | ICD-10-CM | POA: Diagnosis not present

## 2019-06-15 LAB — BETA 2 MICROGLOBULIN, SERUM: Beta-2 Microglobulin: 5.4 mg/L — ABNORMAL HIGH (ref 0.6–2.4)

## 2019-06-15 LAB — KAPPA/LAMBDA LIGHT CHAINS
Kappa free light chain: 254.1 mg/L — ABNORMAL HIGH (ref 3.3–19.4)
Kappa, lambda light chain ratio: 14.95 — ABNORMAL HIGH (ref 0.26–1.65)
Lambda free light chains: 17 mg/L (ref 5.7–26.3)

## 2019-06-15 LAB — IGG, IGA, IGM
IgA: 54 mg/dL — ABNORMAL LOW (ref 87–352)
IgG (Immunoglobin G), Serum: 1603 mg/dL — ABNORMAL HIGH (ref 586–1602)
IgM (Immunoglobulin M), Srm: 67 mg/dL (ref 26–217)

## 2019-06-16 LAB — PROTEIN ELECTROPHORESIS, SERUM, WITH REFLEX
A/G Ratio: 1 (ref 0.7–1.7)
Albumin ELP: 3.2 g/dL (ref 2.9–4.4)
Alpha-1-Globulin: 0.2 g/dL (ref 0.0–0.4)
Alpha-2-Globulin: 0.8 g/dL (ref 0.4–1.0)
Beta Globulin: 0.8 g/dL (ref 0.7–1.3)
Gamma Globulin: 1.4 g/dL (ref 0.4–1.8)
Globulin, Total: 3.2 g/dL (ref 2.2–3.9)
M-Spike, %: 1 g/dL — ABNORMAL HIGH
SPEP Interpretation: 0
Total Protein ELP: 6.4 g/dL (ref 6.0–8.5)

## 2019-06-16 LAB — IMMUNOFIXATION REFLEX, SERUM
IgA: 53 mg/dL — ABNORMAL LOW (ref 87–352)
IgG (Immunoglobin G), Serum: 1620 mg/dL — ABNORMAL HIGH (ref 586–1602)
IgM (Immunoglobulin M), Srm: 65 mg/dL (ref 26–217)

## 2019-06-21 ENCOUNTER — Inpatient Hospital Stay: Payer: Medicare Other

## 2019-06-21 ENCOUNTER — Other Ambulatory Visit: Payer: Medicare Other

## 2019-06-28 ENCOUNTER — Inpatient Hospital Stay: Payer: Medicare Other | Attending: Hematology & Oncology

## 2019-06-28 ENCOUNTER — Other Ambulatory Visit: Payer: Self-pay

## 2019-06-28 ENCOUNTER — Other Ambulatory Visit: Payer: Medicare Other

## 2019-06-28 ENCOUNTER — Other Ambulatory Visit: Payer: Self-pay | Admitting: *Deleted

## 2019-06-28 ENCOUNTER — Encounter: Payer: Self-pay | Admitting: Hematology & Oncology

## 2019-06-28 ENCOUNTER — Inpatient Hospital Stay: Payer: Medicare Other

## 2019-06-28 ENCOUNTER — Inpatient Hospital Stay (HOSPITAL_BASED_OUTPATIENT_CLINIC_OR_DEPARTMENT_OTHER): Payer: Medicare Other | Admitting: Hematology & Oncology

## 2019-06-28 VITALS — BP 154/61 | HR 63 | Temp 98.0°F | Resp 19 | Ht 62.0 in | Wt 189.4 lb

## 2019-06-28 VITALS — BP 177/61 | HR 58 | Temp 97.8°F | Resp 14

## 2019-06-28 DIAGNOSIS — D61818 Other pancytopenia: Secondary | ICD-10-CM

## 2019-06-28 DIAGNOSIS — Z5111 Encounter for antineoplastic chemotherapy: Secondary | ICD-10-CM | POA: Insufficient documentation

## 2019-06-28 DIAGNOSIS — Z79899 Other long term (current) drug therapy: Secondary | ICD-10-CM | POA: Diagnosis not present

## 2019-06-28 DIAGNOSIS — R112 Nausea with vomiting, unspecified: Secondary | ICD-10-CM | POA: Diagnosis not present

## 2019-06-28 DIAGNOSIS — Z923 Personal history of irradiation: Secondary | ICD-10-CM | POA: Diagnosis not present

## 2019-06-28 DIAGNOSIS — C9002 Multiple myeloma in relapse: Secondary | ICD-10-CM

## 2019-06-28 DIAGNOSIS — Z794 Long term (current) use of insulin: Secondary | ICD-10-CM | POA: Insufficient documentation

## 2019-06-28 DIAGNOSIS — Z7982 Long term (current) use of aspirin: Secondary | ICD-10-CM

## 2019-06-28 DIAGNOSIS — C9000 Multiple myeloma not having achieved remission: Secondary | ICD-10-CM | POA: Insufficient documentation

## 2019-06-28 DIAGNOSIS — K625 Hemorrhage of anus and rectum: Secondary | ICD-10-CM | POA: Diagnosis not present

## 2019-06-28 LAB — CMP (CANCER CENTER ONLY)
ALT: 16 U/L (ref 0–44)
AST: 12 U/L — ABNORMAL LOW (ref 15–41)
Albumin: 3.6 g/dL (ref 3.5–5.0)
Alkaline Phosphatase: 133 U/L — ABNORMAL HIGH (ref 38–126)
Anion gap: 6 (ref 5–15)
BUN: 10 mg/dL (ref 8–23)
CO2: 28 mmol/L (ref 22–32)
Calcium: 8.1 mg/dL — ABNORMAL LOW (ref 8.9–10.3)
Chloride: 106 mmol/L (ref 98–111)
Creatinine: 0.85 mg/dL (ref 0.44–1.00)
GFR, Est AFR Am: >60 mL/min (ref >60)
GFR, Estimated: >60 mL/min (ref >60)
Glucose, Bld: 184 mg/dL — ABNORMAL HIGH (ref 70–99)
Potassium: 3.4 mmol/L — ABNORMAL LOW (ref 3.5–5.1)
Sodium: 140 mmol/L (ref 135–145)
Total Bilirubin: 0.5 mg/dL (ref 0.3–1.2)
Total Protein: 6.8 g/dL (ref 6.5–8.1)

## 2019-06-28 LAB — CBC WITH DIFFERENTIAL (CANCER CENTER ONLY)
Abs Immature Granulocytes: 0.03 10*3/uL (ref 0.00–0.07)
Basophils Absolute: 0 10*3/uL (ref 0.0–0.1)
Basophils Relative: 1 %
Eosinophils Absolute: 0 10*3/uL (ref 0.0–0.5)
Eosinophils Relative: 2 %
HCT: 21.8 % — ABNORMAL LOW (ref 36.0–46.0)
Hemoglobin: 7.3 g/dL — ABNORMAL LOW (ref 12.0–15.0)
Immature Granulocytes: 2 %
Lymphocytes Relative: 35 %
Lymphs Abs: 0.6 10*3/uL — ABNORMAL LOW (ref 0.7–4.0)
MCH: 31.5 pg (ref 26.0–34.0)
MCHC: 33.5 g/dL (ref 30.0–36.0)
MCV: 94 fL (ref 80.0–100.0)
Monocytes Absolute: 0.1 10*3/uL (ref 0.1–1.0)
Monocytes Relative: 8 %
Neutro Abs: 1 10*3/uL — ABNORMAL LOW (ref 1.7–7.7)
Neutrophils Relative %: 52 %
Platelet Count: 126 10*3/uL — ABNORMAL LOW (ref 150–400)
RBC: 2.32 MIL/uL — ABNORMAL LOW (ref 3.87–5.11)
RDW: 13.9 % (ref 11.5–15.5)
WBC Count: 1.8 10*3/uL — ABNORMAL LOW (ref 4.0–10.5)
nRBC: 0 % (ref 0.0–0.2)

## 2019-06-28 LAB — SAMPLE TO BLOOD BANK

## 2019-06-28 LAB — PREPARE RBC (CROSSMATCH)

## 2019-06-28 LAB — ABO/RH: ABO/RH(D): O POS

## 2019-06-28 MED ORDER — ACETAMINOPHEN 325 MG PO TABS
ORAL_TABLET | ORAL | Status: AC
  Filled 2019-06-28: qty 2

## 2019-06-28 MED ORDER — ACETAMINOPHEN 325 MG PO TABS
650.0000 mg | ORAL_TABLET | Freq: Once | ORAL | Status: AC
  Administered 2019-06-28: 650 mg via ORAL

## 2019-06-28 MED ORDER — METHYLPREDNISOLONE SODIUM SUCC 125 MG IJ SOLR
INTRAMUSCULAR | Status: AC
  Filled 2019-06-28: qty 2

## 2019-06-28 MED ORDER — SODIUM CHLORIDE 0.9 % IV SOLN
16.0000 mg | Freq: Once | INTRAVENOUS | Status: AC
  Administered 2019-06-28: 16 mg via INTRAVENOUS
  Filled 2019-06-28: qty 8

## 2019-06-28 MED ORDER — METHYLPREDNISOLONE SODIUM SUCC 125 MG IJ SOLR
80.0000 mg | Freq: Once | INTRAMUSCULAR | Status: AC
  Administered 2019-06-28: 80 mg via INTRAVENOUS

## 2019-06-28 MED ORDER — SODIUM CHLORIDE 0.9 % IV SOLN
INTRAVENOUS | Status: AC
  Administered 2019-06-28: 11:00:00 via INTRAVENOUS
  Filled 2019-06-28 (×2): qty 250

## 2019-06-28 NOTE — Progress Notes (Signed)
Hematology and Oncology Follow Up Visit  Vicki Moon [id number] 1951-11-18 68 y.o. 06/28/2019   Principle Diagnosis:   IgG Kappa myeloma -- 1p-, 13q-, 16q- and t(11:14)  Current Therapy:    RVD -- start on 05/09/2019 -- d/c revlimid on 06/14/2019 -- d/c on 06/28/2019  CyBorD -- start cycle #1 on 07/12/2019  S/P Kyphoplasty at L2  XRT to lumbar spine -- to be done in [place]  Xgeva 120 mg sq q 3 months -- next dose on 07/2019     Interim History:  Vicki Moon is back for follow-up.  She finished her radiation therapy a couple days ago.  She is still having quite a bit of nausea and vomiting.  I hope that this is from the radiation.  We have had her off chemotherapy now for 2 weeks.  I think it is obvious we are going to have to make a change with her chemotherapy.  Her blood counts are still on the lower side.  I will have to give her a unit of blood today.  Her hemoglobin is 7.3.  I really think that she would benefit from a unit of blood to help with her nausea vomiting and hopefully get her more energy.  Her white cell count is improving a little bit.  I had to give her Neulasta last time she was here.  I am going to make a change with her protocol.  I will try her on Cytoxan/Velcade/Decadron (CyBorD).  Hopefully, she will do okay with this.  She is responded very nicely to the treatment so far.  Her M spike is now down to 1 g/dL.  Her IgG level is down to 1600 mg/dL.  Her kappa light chain is down to 25.4 mg/dL.  Again, I just do not think she is a candidate for high-dose chemotherapy with stem cell transplant.  I do still think she would be a tolerant of this and would have toxicity that would be considerable.  She does look a bit better from when we last saw her.  Again her blood counts are improving slowly.  I know she has what I would consider high risk cytogenetics.  We do have to be somewhat aggressive.  She is not having any diarrhea.  There is no issues with  fungal infections.  We have her on Diflucan.  Her blood sugars are a little bit better control.  She has had no bleeding.  Overall, I would say her performance status is ECOG 2.  Thankfully, she has not complained of any pain at all.  The kyphoplasty that she had helped quite a bit.  I do think the radiation that she had also has helped.    Medications:  Current Outpatient Medications:    ALPRAZolam (XANAX) 0.5 MG tablet, Take 0.5 mg by mouth daily., Disp: , Rfl:    aspirin 325 MG tablet, Take 325 mg by mouth daily., Disp: , Rfl:    atorvastatin (LIPITOR) 10 MG tablet, Take 10 mg by mouth daily., Disp: , Rfl: 5   B-D ULTRAFINE III SHORT PEN 31G X 8 MM MISC, USE ONE PEN NEEDLE 4 TIMES DAILY, Disp: 100 each, Rfl: 5   Continuous Blood Gluc Sensor ([place]) MISC, Use one sensor every 10 days., Disp: 3 each, Rfl: 2   glipiZIDE (GLUCOTROL XL) 10 MG 24 hr tablet, TAKE 1 TABLET BY MOUTH ONCE DAILY FOR SUGARS, Disp: , Rfl:    HYDROcodone-acetaminophen (NORCO/VICODIN) 5-325 MG tablet, Take 1 tablet  by mouth every 6 (six) hours as needed for moderate pain., Disp: 60 tablet, Rfl: 0   insulin glargine (LANTUS) 100 UNIT/ML injection, Inject 30 Units into the skin at bedtime., Disp: , Rfl:    insulin lispro (HUMALOG KWIKPEN) 100 UNIT/ML KiwkPen, Inject 0.22-0.28 mLs (22-28 Units total) into the skin 3 (three) times daily with meals., Disp: 25 mL, Rfl: 2   latanoprost (XALATAN) 0.005 % ophthalmic solution, Place 1 drop into both eyes at bedtime., Disp: , Rfl:    metroNIDAZOLE (FLAGYL) 500 MG tablet, Take 1 tablet (500 mg total) by mouth 3 (three) times daily. One po bid x 7 days, Disp: 21 tablet, Rfl: 0   NOVOLOG 100 UNIT/ML injection, INJECT 35 UNITS SUBCUTANEOUSLY WITH MEALS, Disp: , Rfl:    ondansetron (ZOFRAN) 8 MG tablet, Take 1 tablet (8 mg total) by mouth 2 (two) times daily as needed (Nausea or vomiting)., Disp: 30 tablet, Rfl: 1   PARoxetine (PAXIL) 30 MG tablet,  Take 30 mg by mouth every morning., Disp: , Rfl: 3   potassium chloride SA (K-DUR) 20 MEQ tablet, Take 2 tablets (40 mEq total) by mouth daily., Disp: 60 tablet, Rfl: 0   prochlorperazine (COMPAZINE) 10 MG tablet, Take 1 tablet (10 mg total) by mouth every 6 (six) hours as needed (Nausea or vomiting)., Disp: 30 tablet, Rfl: 1  Allergies:  Allergies  Allergen Reactions   Metformin Nausea Only    "severe GI"    Past Medical History, Surgical history, Social history, and Family History were reviewed and updated.  Review of Systems: Review of Systems  Constitutional: Negative.   HENT:  Negative.   Eyes: Negative.   Respiratory: Negative.   Cardiovascular: Negative.   Gastrointestinal: Negative.   Endocrine: Negative.   Genitourinary: Negative.    Musculoskeletal: Positive for back pain.  Skin: Negative.   Neurological: Negative.   Hematological: Negative.   Psychiatric/Behavioral: Negative.     Physical Exam:  height is 5\' 2"  (1.575 m) and weight is 189 lb 6.4 oz (85.9 kg). Her oral temperature is 98 F (36.7 C). Her blood pressure is 154/61 (abnormal) and her pulse is 63. Her respiration is 19 and oxygen saturation is 100%.   Wt Readings from Last 3 Encounters:  06/28/19 189 lb 6.4 oz (85.9 kg)  06/14/19 187 lb 12.8 oz (85.2 kg)  06/07/19 194 lb (88 kg)    Physical Exam Vitals signs reviewed.  HENT:     Head: Normocephalic and atraumatic.  Eyes:     Pupils: Pupils are equal, round, and reactive to light.  Neck:     Musculoskeletal: Normal range of motion.  Cardiovascular:     Rate and Rhythm: Normal rate and regular rhythm.     Heart sounds: Normal heart sounds.  Pulmonary:     Effort: Pulmonary effort is normal.     Breath sounds: Normal breath sounds.  Abdominal:     General: Bowel sounds are normal.     Palpations: Abdomen is soft.  Musculoskeletal: Normal range of motion.        General: No tenderness or deformity.  Lymphadenopathy:     Cervical: No  cervical adenopathy.  Skin:    General: Skin is warm and dry.     Findings: No erythema or rash.  Neurological:     Mental Status: She is alert and oriented to person, place, and time.  Psychiatric:        Behavior: Behavior normal.        Thought  Content: Thought content normal.        Judgment: Judgment normal.      Lab Results  Component Value Date   WBC 1.8 (L) 06/28/2019   HGB 7.3 (L) 06/28/2019   HCT 21.8 (L) 06/28/2019   MCV 94.0 06/28/2019   PLT 126 (L) 06/28/2019     Chemistry      Component Value Date/Time   NA 140 06/28/2019 0928   K 3.4 (L) 06/28/2019 0928   CL 106 06/28/2019 0928   CO2 28 06/28/2019 0928   BUN 10 06/28/2019 0928   CREATININE 0.85 06/28/2019 0928   CREATININE 0.65 01/12/2018 1004      Component Value Date/Time   CALCIUM 8.1 (L) 06/28/2019 0928   ALKPHOS 133 (H) 06/28/2019 0928   AST 12 (L) 06/28/2019 0928   ALT 16 06/28/2019 0928   BILITOT 0.5 06/28/2019 0928       Impression and Plan: Ms. Gillies is a 68 year old white female.  She has a new diagnosis of IgG kappa myeloma.  I think this is in the high risk group.  I will give her IV fluids today.  I think this will help her.  I will also give her the unit of blood.  We will have to see how she looks in 2 weeks.  Again, I would hope that she is still having a response to treatment.  We will see what her myeloma studies look like.  I suppose that she might have some underlying myelodysplasia which could be affecting her blood counts.  Again, this was complicated.  She is pancytopenic.  She is little bit unstable with the nausea and vomiting.  She is anemic and needs blood.  It took a while to see her and to get everything arranged.  I did put in a new protocol for chemotherapy.  Overall, we spent about 40 minutes with her today.   [name], MD 7/7/202010:27 AM

## 2019-06-28 NOTE — Patient Instructions (Signed)

## 2019-06-29 DIAGNOSIS — E782 Mixed hyperlipidemia: Secondary | ICD-10-CM | POA: Diagnosis not present

## 2019-06-29 DIAGNOSIS — E119 Type 2 diabetes mellitus without complications: Secondary | ICD-10-CM | POA: Diagnosis not present

## 2019-06-29 DIAGNOSIS — I10 Essential (primary) hypertension: Secondary | ICD-10-CM | POA: Diagnosis not present

## 2019-06-29 DIAGNOSIS — E1165 Type 2 diabetes mellitus with hyperglycemia: Secondary | ICD-10-CM | POA: Diagnosis not present

## 2019-06-29 DIAGNOSIS — E1169 Type 2 diabetes mellitus with other specified complication: Secondary | ICD-10-CM | POA: Diagnosis not present

## 2019-06-29 LAB — TYPE AND SCREEN
ABO/RH(D): O POS
Antibody Screen: NEGATIVE
Unit division: 0

## 2019-06-29 LAB — IGG, IGA, IGM
IgA: 41 mg/dL — ABNORMAL LOW (ref 87–352)
IgG (Immunoglobin G), Serum: 1683 mg/dL — ABNORMAL HIGH (ref 586–1602)
IgM (Immunoglobulin M), Srm: 47 mg/dL (ref 26–217)

## 2019-06-29 LAB — BPAM RBC
Blood Product Expiration Date: 202008052359
ISSUE DATE / TIME: 202007071336
Unit Type and Rh: 5100

## 2019-06-29 LAB — KAPPA/LAMBDA LIGHT CHAINS
Kappa free light chain: 268.3 mg/L — ABNORMAL HIGH (ref 3.3–19.4)
Kappa, lambda light chain ratio: 28.24 — ABNORMAL HIGH (ref 0.26–1.65)
Lambda free light chains: 9.5 mg/L (ref 5.7–26.3)

## 2019-06-29 NOTE — Progress Notes (Signed)
DISCONTINUE ON PATHWAY REGIMEN - Multiple Myeloma and Other Plasma Cell Dyscrasias     A cycle is every 21 days:     Bortezomib      Lenalidomide      Dexamethasone   **Always confirm dose/schedule in your pharmacy ordering system**  REASON: Toxicities / Adverse Event PRIOR TREATMENT: [id number]: VRd (Bortezomib 1.3 mg/m2 Subcut D1, 8, 15 + Lenalidomide 25 mg + Dexamethasone 40 mg) q21 Days x 4-6 Cycles Maximum Prior to St[facility] Cell [facility] TREATMENT RESPONSE: Partial Response (PR)  START ON PATHWAY REGIMEN - Multiple Myeloma and Other Plasma Cell Dyscrasias     A cycle is every 21 days:     Bortezomib      Cyclophosphamide      Dexamethasone   **Always confirm dose/schedule in your pharmacy ordering system**  Patient Characteristics: Newly Diagnosed, Transplant Ineligible or Refused, Unknown or Awaiting Test Results R-ISS Staging: III Disease Classification: Newly Diagnosed Is Patient Eligible for Transplant<= Transplant Ineligible or Refused Risk Status: Unknown Intent of Therapy: Non-Curative / Palliative Intent, Discussed with Patient

## 2019-07-01 ENCOUNTER — Telehealth: Payer: Self-pay | Admitting: *Deleted

## 2019-07-01 LAB — IMMUNOFIXATION REFLEX, SERUM
IgA: 46 mg/dL — ABNORMAL LOW (ref 87–352)
IgG (Immunoglobin G), Serum: 1777 mg/dL — ABNORMAL HIGH (ref 586–1602)
IgM (Immunoglobulin M), Srm: 48 mg/dL (ref 26–217)

## 2019-07-01 LAB — PROTEIN ELECTROPHORESIS, SERUM, WITH REFLEX
A/G Ratio: 0.9 (ref 0.7–1.7)
Albumin ELP: 3.2 g/dL (ref 2.9–4.4)
Alpha-1-Globulin: 0.2 g/dL (ref 0.0–0.4)
Alpha-2-Globulin: 0.9 g/dL (ref 0.4–1.0)
Beta Globulin: 0.9 g/dL (ref 0.7–1.3)
Gamma Globulin: 1.5 g/dL (ref 0.4–1.8)
Globulin, Total: 3.5 g/dL (ref 2.2–3.9)
M-Spike, %: 1.2 g/dL — ABNORMAL HIGH
SPEP Interpretation: 0
Total Protein ELP: 6.7 g/dL (ref 6.0–8.5)

## 2019-07-01 NOTE — Telephone Encounter (Signed)
As noted below by Dr. [name], I informed the patient that her Myeloma is a little worse. Dr. [name] is going to restart your therapy when you come back. She verbalized understanding.

## 2019-07-01 NOTE — Telephone Encounter (Signed)
-----   Message from [name], MD sent at 07/01/2019 11:41 AM EDT ----- Call - the myeloma is a little worse.  The level went from 1.0 to 1.2.  You need to re-start therapy when we see you back!!  Vicki Moon

## 2019-07-04 ENCOUNTER — Other Ambulatory Visit: Payer: Self-pay | Admitting: Hematology & Oncology

## 2019-07-05 ENCOUNTER — Encounter: Payer: Self-pay | Admitting: *Deleted

## 2019-07-12 ENCOUNTER — Inpatient Hospital Stay: Payer: Medicare Other

## 2019-07-12 ENCOUNTER — Inpatient Hospital Stay (HOSPITAL_BASED_OUTPATIENT_CLINIC_OR_DEPARTMENT_OTHER): Payer: Medicare Other | Admitting: Hematology & Oncology

## 2019-07-12 ENCOUNTER — Encounter: Payer: Self-pay | Admitting: Hematology & Oncology

## 2019-07-12 ENCOUNTER — Other Ambulatory Visit: Payer: Self-pay

## 2019-07-12 VITALS — BP 163/65 | HR 72 | Temp 97.5°F | Wt 191.4 lb

## 2019-07-12 DIAGNOSIS — Z794 Long term (current) use of insulin: Secondary | ICD-10-CM | POA: Diagnosis not present

## 2019-07-12 DIAGNOSIS — Z5111 Encounter for antineoplastic chemotherapy: Secondary | ICD-10-CM

## 2019-07-12 DIAGNOSIS — Z79899 Other long term (current) drug therapy: Secondary | ICD-10-CM

## 2019-07-12 DIAGNOSIS — Z923 Personal history of irradiation: Secondary | ICD-10-CM | POA: Diagnosis not present

## 2019-07-12 DIAGNOSIS — Z7982 Long term (current) use of aspirin: Secondary | ICD-10-CM | POA: Diagnosis not present

## 2019-07-12 DIAGNOSIS — K625 Hemorrhage of anus and rectum: Secondary | ICD-10-CM

## 2019-07-12 DIAGNOSIS — D61818 Other pancytopenia: Secondary | ICD-10-CM

## 2019-07-12 DIAGNOSIS — C9000 Multiple myeloma not having achieved remission: Secondary | ICD-10-CM | POA: Diagnosis not present

## 2019-07-12 DIAGNOSIS — R112 Nausea with vomiting, unspecified: Secondary | ICD-10-CM | POA: Diagnosis not present

## 2019-07-12 LAB — CMP (CANCER CENTER ONLY)
ALT: 12 U/L (ref 0–44)
AST: 10 U/L — ABNORMAL LOW (ref 15–41)
Albumin: 3.6 g/dL (ref 3.5–5.0)
Alkaline Phosphatase: 175 U/L — ABNORMAL HIGH (ref 38–126)
Anion gap: 10 (ref 5–15)
BUN: 8 mg/dL (ref 8–23)
CO2: 25 mmol/L (ref 22–32)
Calcium: 8 mg/dL — ABNORMAL LOW (ref 8.9–10.3)
Chloride: 105 mmol/L (ref 98–111)
Creatinine: 0.79 mg/dL (ref 0.44–1.00)
GFR, Est AFR Am: >60 mL/min (ref >60)
GFR, Estimated: >60 mL/min (ref >60)
Glucose, Bld: 312 mg/dL — ABNORMAL HIGH (ref 70–99)
Potassium: 3.2 mmol/L — ABNORMAL LOW (ref 3.5–5.1)
Sodium: 140 mmol/L (ref 135–145)
Total Bilirubin: 0.5 mg/dL (ref 0.3–1.2)
Total Protein: 6.8 g/dL (ref 6.5–8.1)

## 2019-07-12 LAB — CBC WITH DIFFERENTIAL (CANCER CENTER ONLY)
Abs Immature Granulocytes: 0.01 10*3/uL (ref 0.00–0.07)
Basophils Absolute: 0 10*3/uL (ref 0.0–0.1)
Basophils Relative: 1 %
Eosinophils Absolute: 0 10*3/uL (ref 0.0–0.5)
Eosinophils Relative: 1 %
HCT: 26.1 % — ABNORMAL LOW (ref 36.0–46.0)
Hemoglobin: 8.8 g/dL — ABNORMAL LOW (ref 12.0–15.0)
Immature Granulocytes: 1 %
Lymphocytes Relative: 21 %
Lymphs Abs: 0.5 10*3/uL — ABNORMAL LOW (ref 0.7–4.0)
MCH: 31.4 pg (ref 26.0–34.0)
MCHC: 33.7 g/dL (ref 30.0–36.0)
MCV: 93.2 fL (ref 80.0–100.0)
Monocytes Absolute: 0.3 10*3/uL (ref 0.1–1.0)
Monocytes Relative: 12 %
Neutro Abs: 1.4 10*3/uL — ABNORMAL LOW (ref 1.7–7.7)
Neutrophils Relative %: 64 %
Platelet Count: 131 10*3/uL — ABNORMAL LOW (ref 150–400)
RBC: 2.8 MIL/uL — ABNORMAL LOW (ref 3.87–5.11)
RDW: 17.7 % — ABNORMAL HIGH (ref 11.5–15.5)
WBC Count: 2.2 10*3/uL — ABNORMAL LOW (ref 4.0–10.5)
nRBC: 0 % (ref 0.0–0.2)

## 2019-07-12 LAB — IRON AND TIBC
Iron: 125 ug/dL (ref 41–142)
Saturation Ratios: 44 % (ref 21–57)
TIBC: 283 ug/dL (ref 236–444)
UIBC: 157 ug/dL (ref 120–384)

## 2019-07-12 LAB — FERRITIN: Ferritin: 406 ng/mL — ABNORMAL HIGH (ref 11–307)

## 2019-07-12 MED ORDER — SODIUM CHLORIDE 0.9 % IV SOLN
300.0000 mg/m2 | Freq: Once | INTRAVENOUS | Status: AC
  Administered 2019-07-12: 580 mg via INTRAVENOUS
  Filled 2019-07-12: qty 29

## 2019-07-12 MED ORDER — BORTEZOMIB CHEMO SQ INJECTION 3.5 MG (2.5MG/ML)
1.3000 mg/m2 | Freq: Once | INTRAMUSCULAR | Status: AC
  Administered 2019-07-12: 2.5 mg via SUBCUTANEOUS
  Filled 2019-07-12: qty 1

## 2019-07-12 MED ORDER — PALONOSETRON HCL INJECTION 0.25 MG/5ML
INTRAVENOUS | Status: AC
  Filled 2019-07-12: qty 5

## 2019-07-12 MED ORDER — SODIUM CHLORIDE 0.9 % IV SOLN
20.0000 mg | Freq: Once | INTRAVENOUS | Status: AC
  Administered 2019-07-12: 20 mg via INTRAVENOUS
  Filled 2019-07-12: qty 20

## 2019-07-12 MED ORDER — PALONOSETRON HCL INJECTION 0.25 MG/5ML
0.2500 mg | Freq: Once | INTRAVENOUS | Status: AC
  Administered 2019-07-12: 0.25 mg via INTRAVENOUS

## 2019-07-12 MED ORDER — SODIUM CHLORIDE 0.9 % IV SOLN
Freq: Once | INTRAVENOUS | Status: AC
  Administered 2019-07-12: 11:00:00 via INTRAVENOUS
  Filled 2019-07-12: qty 250

## 2019-07-12 NOTE — Patient Instructions (Addendum)
[facility] Discharge Instructions for Patients Receiving Chemotherapy  Today you received the following chemotherapy agents Cytoxan/Velcade  To help prevent nausea and vomiting after your treatment, we encourage you to take your nausea medication as prescribed.   If you develop nausea and vomiting that is not controlled by your nausea medication, call the clinic.   BELOW ARE SYMPTOMS THAT SHOULD BE REPORTED IMMEDIATELY:  *FEVER GREATER THAN 100.5 F  *CHILLS WITH OR WITHOUT FEVER  NAUSEA AND VOMITING THAT IS NOT CONTROLLED WITH YOUR NAUSEA MEDICATION  *UNUSUAL SHORTNESS OF BREATH  *UNUSUAL BRUISING OR BLEEDING  TENDERNESS IN MOUTH AND THROAT WITH OR WITHOUT PRESENCE OF ULCERS  *URINARY PROBLEMS  *BOWEL PROBLEMS  UNUSUAL RASH Items with * indicate a potential emergency and should be followed up as soon as possible.  Feel free to call the clinic should you have any questions or concerns. The clinic phone number is [phone].  Please show the [facility] at check-in to the Emergency Department and triage nurse.  Bortezomib injection (Velcade) What is this medicine? BORTEZOMIB (bor TEZ oh mib) is a medicine that targets proteins in cancer cells and stops the cancer cells from growing. It is used to treat multiple myeloma and mantle-cell lymphoma. This medicine may be used for other purposes; ask your health care provider or pharmacist if you have questions. COMMON BRAND NAME(S): Velcade What should I tell my health care provider before I take this medicine? They need to know if you have any of these conditions:  diabetes  heart disease  irregular heartbeat  liver disease  on hemodialysis  low blood counts, like low white blood cells, platelets, or hemoglobin  peripheral neuropathy  taking medicine for blood pressure  an unusual or allergic reaction to bortezomib, mannitol, boron, other medicines, foods, dyes, or preservatives  pregnant or  trying to get pregnant  breast-feeding How should I use this medicine? This medicine is for injection into a vein or for injection under the skin. It is given by a health care professional in a hospital or clinic setting. Talk to your pediatrician regarding the use of this medicine in children. Special care may be needed. Overdosage: If you think you have taken too much of this medicine contact a poison control center or emergency room at once. NOTE: This medicine is only for you. Do not share this medicine with others. What if I miss a dose? It is important not to miss your dose. Call your doctor or health care professional if you are unable to keep an appointment. What may interact with this medicine? This medicine may interact with the following medications:  ketoconazole  rifampin  ritonavir  St. John's Wort This list may not describe all possible interactions. Give your health care provider a list of all the medicines, herbs, non-prescription drugs, or dietary supplements you use. Also tell them if you smoke, drink alcohol, or use illegal drugs. Some items may interact with your medicine. What should I watch for while using this medicine? You may get drowsy or dizzy. Do not drive, use machinery, or do anything that needs mental alertness until you know how this medicine affects you. Do not stand or sit up quickly, especially if you are an older patient. This reduces the risk of dizzy or fainting spells. In some cases, you may be given additional medicines to help with side effects. Follow all directions for their use. Call your doctor or health care professional for advice if you get a  fever, chills or sore throat, or other symptoms of a cold or flu. Do not treat yourself. This drug decreases your body's ability to fight infections. Try to avoid being around people who are sick. This medicine may increase your risk to bruise or bleed. Call your doctor or health care professional if you  notice any unusual bleeding. You may need blood work done while you are taking this medicine. In some patients, this medicine may cause a serious brain infection that may cause death. If you have any problems seeing, thinking, speaking, walking, or standing, tell your doctor right away. If you cannot reach your doctor, urgently seek other source of medical care. Check with your doctor or health care professional if you get an attack of severe diarrhea, nausea and vomiting, or if you sweat a lot. The loss of too much body fluid can make it dangerous for you to take this medicine. Do not become pregnant while taking this medicine or for at least 7 months after stopping it. Women should inform their doctor if they wish to become pregnant or think they might be pregnant. Men should not father a child while taking this medicine and for at least 4 months after stopping it. There is a potential for serious side effects to an unborn child. Talk to your health care professional or pharmacist for more information. Do not breast-feed an infant while taking this medicine or for 2 months after stopping it. This medicine may interfere with the ability to have a child. You should talk with your doctor or health care professional if you are concerned about your fertility. What side effects may I notice from receiving this medicine? Side effects that you should report to your doctor or health care professional as soon as possible:  allergic reactions like skin rash, itching or hives, swelling of the face, lips, or tongue  breathing problems  changes in hearing  changes in vision  fast, irregular heartbeat  feeling faint or lightheaded, falls  pain, tingling, numbness in the hands or feet  right upper belly pain  seizures  swelling of the ankles, feet, hands  unusual bleeding or bruising  unusually weak or tired  vomiting  yellowing of the eyes or skin Side effects that usually do not require  medical attention (report to your doctor or health care professional if they continue or are bothersome):  changes in emotions or moods  constipation  diarrhea  loss of appetite  headache  irritation at site where injected  nausea This list may not describe all possible side effects. Call your doctor for medical advice about side effects. You may report side effects to FDA at 1-800-FDA-1088. Where should I keep my medicine? This drug is given in a hospital or clinic and will not be stored at home. NOTE: This sheet is a summary. It may not cover all possible information. If you have questions about this medicine, talk to your doctor, pharmacist, or health care provider.  2020 Elsevier/Gold Standard (2018-04-19 16:29:31)   Cyclophosphamide injection (Cytoxan) What is this medicine? CYCLOPHOSPHAMIDE (sye kloe FOSS fa mide) is a chemotherapy drug. It slows the growth of cancer cells. This medicine is used to treat many types of cancer like lymphoma, myeloma, leukemia, breast cancer, and ovarian cancer, to name a few. This medicine may be used for other purposes; ask your health care provider or pharmacist if you have questions. COMMON BRAND NAME(S): Cytoxan, Neosar What should I tell my health care provider before I take this  medicine? They need to know if you have any of these conditions:  blood disorders  history of other chemotherapy  infection  kidney disease  liver disease  recent or ongoing radiation therapy  tumors in the bone marrow  an unusual or allergic reaction to cyclophosphamide, other chemotherapy, other medicines, foods, dyes, or preservatives  pregnant or trying to get pregnant  breast-feeding How should I use this medicine? This drug is usually given as an injection into a vein or muscle or by infusion into a vein. It is administered in a hospital or clinic by a specially trained health care professional. Talk to your pediatrician regarding the use of  this medicine in children. Special care may be needed. Overdosage: If you think you have taken too much of this medicine contact a poison control center or emergency room at once. NOTE: This medicine is only for you. Do not share this medicine with others. What if I miss a dose? It is important not to miss your dose. Call your doctor or health care professional if you are unable to keep an appointment. What may interact with this medicine? This medicine may interact with the following medications:  amiodarone  amphotericin B  azathioprine  certain antiviral medicines for HIV or AIDS such as protease inhibitors (e.g., indinavir, ritonavir) and zidovudine  certain blood pressure medications such as benazepril, captopril, enalapril, fosinopril, lisinopril, moexipril, monopril, perindopril, quinapril, ramipril, trandolapril  certain cancer medications such as anthracyclines (e.g., daunorubicin, doxorubicin), busulfan, cytarabine, paclitaxel, pentostatin, tamoxifen, trastuzumab  certain diuretics such as chlorothiazide, chlorthalidone, hydrochlorothiazide, indapamide, metolazone  certain medicines that treat or prevent blood clots like warfarin  certain muscle relaxants such as succinylcholine  cyclosporine  etanercept  indomethacin  medicines to increase blood counts like filgrastim, pegfilgrastim, sargramostim  medicines used as general anesthesia  metronidazole  natalizumab This list may not describe all possible interactions. Give your health care provider a list of all the medicines, herbs, non-prescription drugs, or dietary supplements you use. Also tell them if you smoke, drink alcohol, or use illegal drugs. Some items may interact with your medicine. What should I watch for while using this medicine? Visit your doctor for checks on your progress. This drug may make you feel generally unwell. This is not uncommon, as chemotherapy can affect healthy cells as well as cancer  cells. Report any side effects. Continue your course of treatment even though you feel ill unless your doctor tells you to stop. Drink water or other fluids as directed. Urinate often, even at night. In some cases, you may be given additional medicines to help with side effects. Follow all directions for their use. Call your doctor or health care professional for advice if you get a fever, chills or sore throat, or other symptoms of a cold or flu. Do not treat yourself. This drug decreases your body's ability to fight infections. Try to avoid being around people who are sick. This medicine may increase your risk to bruise or bleed. Call your doctor or health care professional if you notice any unusual bleeding. Be careful brushing and flossing your teeth or using a toothpick because you may get an infection or bleed more easily. If you have any dental work done, tell your dentist you are receiving this medicine. You may get drowsy or dizzy. Do not drive, use machinery, or do anything that needs mental alertness until you know how this medicine affects you. Do not become pregnant while taking this medicine or for 1 year after  stopping it. Women should inform their doctor if they wish to become pregnant or think they might be pregnant. Men should not father a child while taking this medicine and for 4 months after stopping it. There is a potential for serious side effects to an unborn child. Talk to your health care professional or pharmacist for more information. Do not breast-feed an infant while taking this medicine. This medicine may interfere with the ability to have a child. This medicine has caused ovarian failure in some women. This medicine has caused reduced sperm counts in some men. You should talk with your doctor or health care professional if you are concerned about your fertility. If you are going to have surgery, tell your doctor or health care professional that you have taken this  medicine. What side effects may I notice from receiving this medicine? Side effects that you should report to your doctor or health care professional as soon as possible:  allergic reactions like skin rash, itching or hives, swelling of the face, lips, or tongue  low blood counts - this medicine may decrease the number of white blood cells, red blood cells and platelets. You may be at increased risk for infections and bleeding.  signs of infection - fever or chills, cough, sore throat, pain or difficulty passing urine  signs of decreased platelets or bleeding - bruising, pinpoint red spots on the skin, black, tarry stools, blood in the urine  signs of decreased red blood cells - unusually weak or tired, fainting spells, lightheadedness  breathing problems  dark urine  dizziness  palpitations  swelling of the ankles, feet, hands  trouble passing urine or change in the amount of urine  weight gain  yellowing of the eyes or skin Side effects that usually do not require medical attention (report to your doctor or health care professional if they continue or are bothersome):  changes in nail or skin color  hair loss  missed menstrual periods  mouth sores  nausea, vomiting This list may not describe all possible side effects. Call your doctor for medical advice about side effects. You may report side effects to FDA at 1-800-FDA-1088. Where should I keep my medicine? This drug is given in a hospital or clinic and will not be stored at home. NOTE: This sheet is a summary. It may not cover all possible information. If you have questions about this medicine, talk to your doctor, pharmacist, or health care provider.  2020 Elsevier/Gold Standard (2012-10-22 16:22:58)

## 2019-07-12 NOTE — Progress Notes (Signed)
Ok to treat with ANC 1.4 per Dr Ennever 

## 2019-07-12 NOTE — Progress Notes (Signed)
Hematology and Oncology Follow Up Visit  Vicki Moon [id number] 04-26-1951 68 y.o. 07/12/2019   Principle Diagnosis:   IgG Kappa myeloma -- 1p-, 13q-, 16q- and t(11:14)  Current Therapy:    RVD -- start on 05/09/2019 -- d/c revlimid on 06/14/2019 -- d/c on 06/28/2019  CyBorD -- start cycle #1 on 07/12/2019  S/P Kyphoplasty at L2  XRT to lumbar spine -- to be done in [place]  Xgeva 120 mg sq q 3 months -- next dose on 07/2019     Interim History:  Vicki Moon is back for follow-up.  She actually looks quite good.  This is probably the best that I have seen her look in such a long time.  She really had a tough time with radiation.  She had very good response to radiation with marked decrease in her back discomfort.  Unfortunately, because we had to hold her chemotherapy, her M spike has been trending up a little bit.  We last saw her, her M spike was up to 1.2 g/dL.  Her IgG level was 1700 mg/dL.  Her kappa light chain was 27 mg/dL.  We will have to get her back on treatment now.  Hopefully, she will do well with the CyBorD regimen.  She has had no problems with her bowels or bladder.  There has been no diarrhea.  She has had no cough.  There has been no bleeding.  She has had no rashes.  There is been no leg swelling.  Overall, her performance status is ECOG 1.  Medications:  Current Outpatient Medications:  .  ALPRAZolam (XANAX) 0.5 MG tablet, Take 0.5 mg by mouth daily., Disp: , Rfl:  .  atorvastatin (LIPITOR) 10 MG tablet, Take 10 mg by mouth daily., Disp: , Rfl: 5 .  B-D ULTRAFINE III SHORT PEN 31G X 8 MM MISC, USE ONE PEN NEEDLE 4 TIMES DAILY, Disp: 100 each, Rfl: 5 .  Continuous Blood Gluc Sensor ([place]) MISC, Use one sensor every 10 days., Disp: 3 each, Rfl: 2 .  glipiZIDE (GLUCOTROL XL) 10 MG 24 hr tablet, TAKE 1 TABLET BY MOUTH ONCE DAILY FOR SUGARS, Disp: , Rfl:  .  insulin lispro (HUMALOG KWIKPEN) 100 UNIT/ML KiwkPen, Inject 0.22-0.28  mLs (22-28 Units total) into the skin 3 (three) times daily with meals., Disp: 25 mL, Rfl: 2 .  latanoprost (XALATAN) 0.005 % ophthalmic solution, Place 1 drop into both eyes at bedtime., Disp: , Rfl:  .  NOVOLOG 100 UNIT/ML injection, INJECT 35 UNITS SUBCUTANEOUSLY WITH MEALS, Disp: , Rfl:  .  PARoxetine (PAXIL) 30 MG tablet, Take 30 mg by mouth every morning., Disp: , Rfl: 3 .  potassium chloride SA (K-DUR) 20 MEQ tablet, Take 2 tablets (40 mEq total) by mouth daily., Disp: 60 tablet, Rfl: 0  Allergies:  Allergies  Allergen Reactions  . Metformin Nausea Only    "severe GI"    Past Medical History, Surgical history, Social history, and Family History were reviewed and updated.  Review of Systems: Review of Systems  Constitutional: Negative.   HENT:  Negative.   Eyes: Negative.   Respiratory: Negative.   Cardiovascular: Negative.   Gastrointestinal: Negative.   Endocrine: Negative.   Genitourinary: Negative.    Musculoskeletal: Positive for back pain.  Skin: Negative.   Neurological: Negative.   Hematological: Negative.   Psychiatric/Behavioral: Negative.     Physical Exam:  weight is 191 lb 6.4 oz (86.8 kg). Her oral temperature is 97.5 F (36.4  C) (abnormal). Her blood pressure is 163/65 (abnormal) and her pulse is 72. Her oxygen saturation is 100%.   Wt Readings from Last 3 Encounters:  07/12/19 191 lb 6.4 oz (86.8 kg)  06/28/19 189 lb 6.4 oz (85.9 kg)  06/14/19 187 lb 12.8 oz (85.2 kg)    Physical Exam Vitals signs reviewed.  HENT:     Head: Normocephalic and atraumatic.  Eyes:     Pupils: Pupils are equal, round, and reactive to light.  Neck:     Musculoskeletal: Normal range of motion.  Cardiovascular:     Rate and Rhythm: Normal rate and regular rhythm.     Heart sounds: Normal heart sounds.  Pulmonary:     Effort: Pulmonary effort is normal.     Breath sounds: Normal breath sounds.  Abdominal:     General: Bowel sounds are normal.     Palpations:  Abdomen is soft.  Musculoskeletal: Normal range of motion.        General: No tenderness or deformity.  Lymphadenopathy:     Cervical: No cervical adenopathy.  Skin:    General: Skin is warm and dry.     Findings: No erythema or rash.  Neurological:     Mental Status: She is alert and oriented to person, place, and time.  Psychiatric:        Behavior: Behavior normal.        Thought Content: Thought content normal.        Judgment: Judgment normal.      Lab Results  Component Value Date   WBC 2.2 (L) 07/12/2019   HGB 8.8 (L) 07/12/2019   HCT 26.1 (L) 07/12/2019   MCV 93.2 07/12/2019   PLT 131 (L) 07/12/2019     Chemistry      Component Value Date/Time   NA 140 07/12/2019 0936   K 3.2 (L) 07/12/2019 0936   CL 105 07/12/2019 0936   CO2 25 07/12/2019 0936   BUN 8 07/12/2019 0936   CREATININE 0.79 07/12/2019 0936   CREATININE 0.65 01/12/2018 1004      Component Value Date/Time   CALCIUM 8.0 (L) 07/12/2019 0936   ALKPHOS 175 (H) 07/12/2019 0936   AST 10 (L) 07/12/2019 0936   ALT 12 07/12/2019 0936   BILITOT 0.5 07/12/2019 0936       Impression and Plan: Vicki Moon is a 68 year old white female.  She has a new diagnosis of IgG kappa myeloma.  I think her cytogenetics placer in the high risk group.  We will move forward with chemotherapy today.  I realize her blood counts are still little on the lower side but I think we have to try to push ahead so that we can try to get her myeloma back under control and back to a partial remission.  We will go ahead with weekly treatment for 3 weeks on 1 week off. We will have to monitor her blood counts closely.  I am just glad that she is feeling better and that her quality of life is doing better.  [name], MD 7/21/202010:58 AM

## 2019-07-13 ENCOUNTER — Other Ambulatory Visit: Payer: Self-pay | Admitting: Hematology & Oncology

## 2019-07-13 DIAGNOSIS — Z510 Encounter for antineoplastic radiation therapy: Secondary | ICD-10-CM | POA: Diagnosis not present

## 2019-07-13 DIAGNOSIS — Z79899 Other long term (current) drug therapy: Secondary | ICD-10-CM | POA: Diagnosis not present

## 2019-07-13 DIAGNOSIS — M8458XA Pathological fracture in neoplastic disease, other specified site, initial encounter for fracture: Secondary | ICD-10-CM | POA: Diagnosis not present

## 2019-07-13 DIAGNOSIS — Z794 Long term (current) use of insulin: Secondary | ICD-10-CM | POA: Diagnosis not present

## 2019-07-13 DIAGNOSIS — F17210 Nicotine dependence, cigarettes, uncomplicated: Secondary | ICD-10-CM | POA: Diagnosis not present

## 2019-07-13 DIAGNOSIS — E119 Type 2 diabetes mellitus without complications: Secondary | ICD-10-CM | POA: Diagnosis not present

## 2019-07-13 DIAGNOSIS — Z7982 Long term (current) use of aspirin: Secondary | ICD-10-CM | POA: Diagnosis not present

## 2019-07-13 DIAGNOSIS — C9000 Multiple myeloma not having achieved remission: Secondary | ICD-10-CM | POA: Diagnosis not present

## 2019-07-13 DIAGNOSIS — Z833 Family history of diabetes mellitus: Secondary | ICD-10-CM | POA: Diagnosis not present

## 2019-07-13 DIAGNOSIS — Z809 Family history of malignant neoplasm, unspecified: Secondary | ICD-10-CM | POA: Diagnosis not present

## 2019-07-13 LAB — IGG, IGA, IGM
IgA: 36 mg/dL — ABNORMAL LOW (ref 87–352)
IgG (Immunoglobin G), Serum: 1760 mg/dL — ABNORMAL HIGH (ref 586–1602)
IgM (Immunoglobulin M), Srm: 39 mg/dL (ref 26–217)

## 2019-07-13 LAB — KAPPA/LAMBDA LIGHT CHAINS
Kappa free light chain: 250.3 mg/L — ABNORMAL HIGH (ref 3.3–19.4)
Kappa, lambda light chain ratio: 34.76 — ABNORMAL HIGH (ref 0.26–1.65)
Lambda free light chains: 7.2 mg/L (ref 5.7–26.3)

## 2019-07-13 LAB — ERYTHROPOIETIN: Erythropoietin: 115.6 m[IU]/mL — ABNORMAL HIGH (ref 2.6–18.5)

## 2019-07-14 LAB — PROTEIN ELECTROPHORESIS, SERUM, WITH REFLEX
A/G Ratio: 0.9 (ref 0.7–1.7)
Albumin ELP: 3.3 g/dL (ref 2.9–4.4)
Alpha-1-Globulin: 0.2 g/dL (ref 0.0–0.4)
Alpha-2-Globulin: 0.9 g/dL (ref 0.4–1.0)
Beta Globulin: 0.9 g/dL (ref 0.7–1.3)
Gamma Globulin: 1.6 g/dL (ref 0.4–1.8)
Globulin, Total: 3.7 g/dL (ref 2.2–3.9)
M-Spike, %: 1.3 g/dL — ABNORMAL HIGH
SPEP Interpretation: 0
Total Protein ELP: 7 g/dL (ref 6.0–8.5)

## 2019-07-14 LAB — IMMUNOFIXATION REFLEX, SERUM
IgA: 37 mg/dL — ABNORMAL LOW (ref 87–352)
IgG (Immunoglobin G), Serum: 2032 mg/dL — ABNORMAL HIGH (ref 586–1602)
IgM (Immunoglobulin M), Srm: 42 mg/dL (ref 26–217)

## 2019-07-18 DIAGNOSIS — H5201 Hypermetropia, right eye: Secondary | ICD-10-CM | POA: Diagnosis not present

## 2019-07-18 DIAGNOSIS — H401133 Primary open-angle glaucoma, bilateral, severe stage: Secondary | ICD-10-CM | POA: Diagnosis not present

## 2019-07-19 ENCOUNTER — Other Ambulatory Visit: Payer: Self-pay | Admitting: *Deleted

## 2019-07-19 ENCOUNTER — Inpatient Hospital Stay: Payer: Medicare Other

## 2019-07-19 ENCOUNTER — Other Ambulatory Visit: Payer: Self-pay | Admitting: Hematology & Oncology

## 2019-07-19 ENCOUNTER — Telehealth: Payer: Self-pay | Admitting: *Deleted

## 2019-07-19 ENCOUNTER — Other Ambulatory Visit: Payer: Self-pay

## 2019-07-19 VITALS — BP 167/64 | HR 59 | Temp 97.3°F | Resp 16

## 2019-07-19 DIAGNOSIS — C9000 Multiple myeloma not having achieved remission: Secondary | ICD-10-CM

## 2019-07-19 DIAGNOSIS — K625 Hemorrhage of anus and rectum: Secondary | ICD-10-CM | POA: Diagnosis not present

## 2019-07-19 DIAGNOSIS — E876 Hypokalemia: Secondary | ICD-10-CM

## 2019-07-19 DIAGNOSIS — Z79899 Other long term (current) drug therapy: Secondary | ICD-10-CM | POA: Diagnosis not present

## 2019-07-19 DIAGNOSIS — D61818 Other pancytopenia: Secondary | ICD-10-CM | POA: Diagnosis not present

## 2019-07-19 DIAGNOSIS — Z5111 Encounter for antineoplastic chemotherapy: Secondary | ICD-10-CM | POA: Diagnosis not present

## 2019-07-19 DIAGNOSIS — R112 Nausea with vomiting, unspecified: Secondary | ICD-10-CM | POA: Diagnosis not present

## 2019-07-19 LAB — CMP (CANCER CENTER ONLY)
ALT: 12 U/L (ref 0–44)
AST: 10 U/L — ABNORMAL LOW (ref 15–41)
Albumin: 3.6 g/dL (ref 3.5–5.0)
Alkaline Phosphatase: 149 U/L — ABNORMAL HIGH (ref 38–126)
Anion gap: 7 (ref 5–15)
BUN: 8 mg/dL (ref 8–23)
CO2: 28 mmol/L (ref 22–32)
Calcium: 8.3 mg/dL — ABNORMAL LOW (ref 8.9–10.3)
Chloride: 106 mmol/L (ref 98–111)
Creatinine: 0.7 mg/dL (ref 0.44–1.00)
GFR, Est AFR Am: >60 mL/min (ref >60)
GFR, Estimated: >60 mL/min (ref >60)
Glucose, Bld: 182 mg/dL — ABNORMAL HIGH (ref 70–99)
Potassium: 2.9 mmol/L — CL (ref 3.5–5.1)
Sodium: 141 mmol/L (ref 135–145)
Total Bilirubin: 0.5 mg/dL (ref 0.3–1.2)
Total Protein: 7.2 g/dL (ref 6.5–8.1)

## 2019-07-19 LAB — CBC WITH DIFFERENTIAL (CANCER CENTER ONLY)
Band Neutrophils: 0 %
Basophils Absolute: 0 10*3/uL (ref 0.0–0.1)
Basophils Relative: 0 %
Eosinophils Absolute: 0.1 10*3/uL (ref 0.0–0.5)
Eosinophils Relative: 2 %
HCT: 27.2 % — ABNORMAL LOW (ref 36.0–46.0)
Hemoglobin: 8.8 g/dL — ABNORMAL LOW (ref 12.0–15.0)
Lymphocytes Relative: 22 %
Lymphs Abs: 0.5 10*3/uL — ABNORMAL LOW (ref 0.7–4.0)
MCH: 30.9 pg (ref 26.0–34.0)
MCHC: 32.4 g/dL (ref 30.0–36.0)
MCV: 95.4 fL (ref 80.0–100.0)
Monocytes Absolute: 0.2 10*3/uL (ref 0.1–1.0)
Monocytes Relative: 9 %
Neutro Abs: 1.4 10*3/uL — ABNORMAL LOW (ref 1.7–7.7)
Neutrophils Relative %: 65 %
Platelet Count: 152 10*3/uL (ref 150–400)
RBC: 2.85 MIL/uL — ABNORMAL LOW (ref 3.87–5.11)
RDW: 19.1 % — ABNORMAL HIGH (ref 11.5–15.5)
WBC Count: 2.2 10*3/uL — ABNORMAL LOW (ref 4.0–10.5)
nRBC: 0 % (ref 0.0–0.2)

## 2019-07-19 LAB — SAMPLE TO BLOOD BANK

## 2019-07-19 LAB — LACTATE DEHYDROGENASE: LDH: 205 U/L — ABNORMAL HIGH (ref 98–192)

## 2019-07-19 MED ORDER — BORTEZOMIB CHEMO SQ INJECTION 3.5 MG (2.5MG/ML)
1.3000 mg/m2 | Freq: Once | INTRAMUSCULAR | Status: AC
  Administered 2019-07-19: 2.5 mg via SUBCUTANEOUS
  Filled 2019-07-19: qty 1

## 2019-07-19 MED ORDER — PALONOSETRON HCL INJECTION 0.25 MG/5ML
0.2500 mg | Freq: Once | INTRAVENOUS | Status: AC
  Administered 2019-07-19: 0.25 mg via INTRAVENOUS

## 2019-07-19 MED ORDER — POTASSIUM CHLORIDE CRYS ER 20 MEQ PO TBCR: 40.0000 meq | EXTENDED_RELEASE_TABLET | Freq: Every day | ORAL | 1 refills | Status: DC

## 2019-07-19 MED ORDER — POTASSIUM CHLORIDE CRYS ER 20 MEQ PO TBCR
40.0000 meq | EXTENDED_RELEASE_TABLET | Freq: Two times a day (BID) | ORAL | Status: DC
  Administered 2019-07-19: 40 meq via ORAL
  Filled 2019-07-19: qty 2

## 2019-07-19 MED ORDER — SODIUM CHLORIDE 0.9 % IV SOLN
Freq: Once | INTRAVENOUS | Status: AC
  Administered 2019-07-19: 11:00:00 via INTRAVENOUS
  Filled 2019-07-19: qty 250

## 2019-07-19 MED ORDER — SODIUM CHLORIDE 0.9 % IV SOLN
300.0000 mg/m2 | Freq: Once | INTRAVENOUS | Status: AC
  Administered 2019-07-19: 580 mg via INTRAVENOUS
  Filled 2019-07-19: qty 29

## 2019-07-19 MED ORDER — DEXAMETHASONE SODIUM PHOSPHATE 10 MG/ML IJ SOLN
INTRAMUSCULAR | Status: AC
  Filled 2019-07-19: qty 1

## 2019-07-19 MED ORDER — SODIUM CHLORIDE 0.9 % IV SOLN
20.0000 mg | Freq: Once | INTRAVENOUS | Status: AC
  Administered 2019-07-19: 20 mg via INTRAVENOUS
  Filled 2019-07-19: qty 20

## 2019-07-19 NOTE — Progress Notes (Signed)
Reviewed blood work with Dr. [name].  Ok to treat today. Wants patient to take 40 meq of K now and 40 meq when she leaves today.  Will restart K at home.  Patient not currently taking

## 2019-07-19 NOTE — Telephone Encounter (Signed)
Dr. [name] notified of potassium of 2.9.  No new orders received at this time.

## 2019-07-19 NOTE — Patient Instructions (Signed)
[facility] Discharge Instructions for Patients Receiving Chemotherapy  Today you received the following chemotherapy agents Cytoxan  To help prevent nausea and vomiting after your treatment, we encourage you to take your nausea medication    If you develop nausea and vomiting that is not controlled by your nausea medication, call the clinic.   BELOW ARE SYMPTOMS THAT SHOULD BE REPORTED IMMEDIATELY:  *FEVER GREATER THAN 100.5 F  *CHILLS WITH OR WITHOUT FEVER  NAUSEA AND VOMITING THAT IS NOT CONTROLLED WITH YOUR NAUSEA MEDICATION  *UNUSUAL SHORTNESS OF BREATH  *UNUSUAL BRUISING OR BLEEDING  TENDERNESS IN MOUTH AND THROAT WITH OR WITHOUT PRESENCE OF ULCERS  *URINARY PROBLEMS  *BOWEL PROBLEMS  UNUSUAL RASH Items with * indicate a potential emergency and should be followed up as soon as possible.  Feel free to call the clinic should you have any questions or concerns. The clinic phone number is [phone].  Please show the [facility] at check-in to the Emergency Department and triage nurse.

## 2019-07-20 LAB — IGG, IGA, IGM
IgA: 32 mg/dL — ABNORMAL LOW (ref 87–352)
IgG (Immunoglobin G), Serum: 1980 mg/dL — ABNORMAL HIGH (ref 586–1602)
IgM (Immunoglobulin M), Srm: 32 mg/dL (ref 26–217)

## 2019-07-20 LAB — KAPPA/LAMBDA LIGHT CHAINS
Kappa free light chain: 236 mg/L — ABNORMAL HIGH (ref 3.3–19.4)
Kappa, lambda light chain ratio: 36.88 — ABNORMAL HIGH (ref 0.26–1.65)
Lambda free light chains: 6.4 mg/L (ref 5.7–26.3)

## 2019-07-21 LAB — PROTEIN ELECTROPHORESIS, SERUM, WITH REFLEX
A/G Ratio: 1 (ref 0.7–1.7)
Albumin ELP: 3.4 g/dL (ref 2.9–4.4)
Alpha-1-Globulin: 0.2 g/dL (ref 0.0–0.4)
Alpha-2-Globulin: 0.8 g/dL (ref 0.4–1.0)
Beta Globulin: 0.8 g/dL (ref 0.7–1.3)
Gamma Globulin: 1.5 g/dL (ref 0.4–1.8)
Globulin, Total: 3.3 g/dL (ref 2.2–3.9)
M-Spike, %: 1.2 g/dL — ABNORMAL HIGH
SPEP Interpretation: 0
Total Protein ELP: 6.7 g/dL (ref 6.0–8.5)

## 2019-07-21 LAB — IMMUNOFIXATION REFLEX, SERUM
IgA: 34 mg/dL — ABNORMAL LOW (ref 87–352)
IgG (Immunoglobin G), Serum: 1908 mg/dL — ABNORMAL HIGH (ref 586–1602)
IgM (Immunoglobulin M), Srm: 39 mg/dL (ref 26–217)

## 2019-07-25 ENCOUNTER — Other Ambulatory Visit: Payer: Self-pay | Admitting: *Deleted

## 2019-07-25 DIAGNOSIS — C9000 Multiple myeloma not having achieved remission: Secondary | ICD-10-CM

## 2019-07-26 ENCOUNTER — Other Ambulatory Visit: Payer: Self-pay

## 2019-07-26 ENCOUNTER — Inpatient Hospital Stay: Payer: Medicare Other | Attending: Hematology & Oncology

## 2019-07-26 ENCOUNTER — Inpatient Hospital Stay: Payer: Medicare Other

## 2019-07-26 VITALS — BP 142/59 | HR 72 | Temp 97.5°F | Resp 18

## 2019-07-26 DIAGNOSIS — L02211 Cutaneous abscess of abdominal wall: Secondary | ICD-10-CM | POA: Diagnosis not present

## 2019-07-26 DIAGNOSIS — Z923 Personal history of irradiation: Secondary | ICD-10-CM | POA: Insufficient documentation

## 2019-07-26 DIAGNOSIS — Z79899 Other long term (current) drug therapy: Secondary | ICD-10-CM | POA: Insufficient documentation

## 2019-07-26 DIAGNOSIS — Z5111 Encounter for antineoplastic chemotherapy: Secondary | ICD-10-CM | POA: Diagnosis not present

## 2019-07-26 DIAGNOSIS — C9000 Multiple myeloma not having achieved remission: Secondary | ICD-10-CM

## 2019-07-26 DIAGNOSIS — Z8614 Personal history of Methicillin resistant Staphylococcus aureus infection: Secondary | ICD-10-CM | POA: Insufficient documentation

## 2019-07-26 DIAGNOSIS — Z794 Long term (current) use of insulin: Secondary | ICD-10-CM | POA: Diagnosis not present

## 2019-07-26 LAB — CBC WITH DIFFERENTIAL (CANCER CENTER ONLY)
Abs Immature Granulocytes: 0.03 10*3/uL (ref 0.00–0.07)
Basophils Absolute: 0 10*3/uL (ref 0.0–0.1)
Basophils Relative: 0 %
Eosinophils Absolute: 0 10*3/uL (ref 0.0–0.5)
Eosinophils Relative: 1 %
HCT: 28 % — ABNORMAL LOW (ref 36.0–46.0)
Hemoglobin: 9.1 g/dL — ABNORMAL LOW (ref 12.0–15.0)
Immature Granulocytes: 1 %
Lymphocytes Relative: 17 %
Lymphs Abs: 0.4 10*3/uL — ABNORMAL LOW (ref 0.7–4.0)
MCH: 32 pg (ref 26.0–34.0)
MCHC: 32.5 g/dL (ref 30.0–36.0)
MCV: 98.6 fL (ref 80.0–100.0)
Monocytes Absolute: 0.3 10*3/uL (ref 0.1–1.0)
Monocytes Relative: 10 %
Neutro Abs: 1.8 10*3/uL (ref 1.7–7.7)
Neutrophils Relative %: 71 %
Platelet Count: 139 10*3/uL — ABNORMAL LOW (ref 150–400)
RBC: 2.84 MIL/uL — ABNORMAL LOW (ref 3.87–5.11)
RDW: 20.8 % — ABNORMAL HIGH (ref 11.5–15.5)
WBC Count: 2.6 10*3/uL — ABNORMAL LOW (ref 4.0–10.5)
nRBC: 0 % (ref 0.0–0.2)

## 2019-07-26 LAB — CMP (CANCER CENTER ONLY)
ALT: 12 U/L (ref 0–44)
AST: 9 U/L — ABNORMAL LOW (ref 15–41)
Albumin: 3.5 g/dL (ref 3.5–5.0)
Alkaline Phosphatase: 156 U/L — ABNORMAL HIGH (ref 38–126)
Anion gap: 5 (ref 5–15)
BUN: 12 mg/dL (ref 8–23)
CO2: 29 mmol/L (ref 22–32)
Calcium: 8.5 mg/dL — ABNORMAL LOW (ref 8.9–10.3)
Chloride: 106 mmol/L (ref 98–111)
Creatinine: 0.74 mg/dL (ref 0.44–1.00)
GFR, Est AFR Am: >60 mL/min (ref >60)
GFR, Estimated: >60 mL/min (ref >60)
Glucose, Bld: 163 mg/dL — ABNORMAL HIGH (ref 70–99)
Potassium: 3.6 mmol/L (ref 3.5–5.1)
Sodium: 140 mmol/L (ref 135–145)
Total Bilirubin: 0.4 mg/dL (ref 0.3–1.2)
Total Protein: 6.8 g/dL (ref 6.5–8.1)

## 2019-07-26 LAB — SAMPLE TO BLOOD BANK

## 2019-07-26 MED ORDER — PALONOSETRON HCL INJECTION 0.25 MG/5ML
0.2500 mg | Freq: Once | INTRAVENOUS | Status: AC
  Administered 2019-07-26: 0.25 mg via INTRAVENOUS

## 2019-07-26 MED ORDER — BORTEZOMIB CHEMO SQ INJECTION 3.5 MG (2.5MG/ML)
1.3000 mg/m2 | Freq: Once | INTRAMUSCULAR | Status: AC
  Administered 2019-07-26: 2.5 mg via SUBCUTANEOUS
  Filled 2019-07-26: qty 1

## 2019-07-26 MED ORDER — SODIUM CHLORIDE 0.9 % IV SOLN
20.0000 mg | Freq: Once | INTRAVENOUS | Status: AC
  Administered 2019-07-26: 20 mg via INTRAVENOUS
  Filled 2019-07-26: qty 20

## 2019-07-26 MED ORDER — SODIUM CHLORIDE 0.9 % IV SOLN
Freq: Once | INTRAVENOUS | Status: AC
  Administered 2019-07-26: 12:00:00 via INTRAVENOUS
  Filled 2019-07-26: qty 250

## 2019-07-26 MED ORDER — SODIUM CHLORIDE 0.9 % IV SOLN
300.0000 mg/m2 | Freq: Once | INTRAVENOUS | Status: AC
  Administered 2019-07-26: 580 mg via INTRAVENOUS
  Filled 2019-07-26: qty 29

## 2019-07-26 MED ORDER — PALONOSETRON HCL INJECTION 0.25 MG/5ML
INTRAVENOUS | Status: AC
  Filled 2019-07-26: qty 5

## 2019-07-26 NOTE — Patient Instructions (Signed)
Bortezomib injection What is this medicine? BORTEZOMIB (bor TEZ oh mib) is a medicine that targets proteins in cancer cells and stops the cancer cells from growing. It is used to treat multiple myeloma and mantle-cell lymphoma. This medicine may be used for other purposes; ask your health care provider or pharmacist if you have questions. COMMON BRAND NAME(S): Velcade What should I tell my health care provider before I take this medicine? They need to know if you have any of these conditions:  diabetes  heart disease  irregular heartbeat  liver disease  on hemodialysis  low blood counts, like low white blood cells, platelets, or hemoglobin  peripheral neuropathy  taking medicine for blood pressure  an unusual or allergic reaction to bortezomib, mannitol, boron, other medicines, foods, dyes, or preservatives  pregnant or trying to get pregnant  breast-feeding How should I use this medicine? This medicine is for injection into a vein or for injection under the skin. It is given by a health care professional in a hospital or clinic setting. Talk to your pediatrician regarding the use of this medicine in children. Special care may be needed. Overdosage: If you think you have taken too much of this medicine contact a poison control center or emergency room at once. NOTE: This medicine is only for you. Do not share this medicine with others. What if I miss a dose? It is important not to miss your dose. Call your doctor or health care professional if you are unable to keep an appointment. What may interact with this medicine? This medicine may interact with the following medications:  ketoconazole  rifampin  ritonavir  St. John's Wort This list may not describe all possible interactions. Give your health care provider a list of all the medicines, herbs, non-prescription drugs, or dietary supplements you use. Also tell them if you smoke, drink alcohol, or use illegal drugs. Some  items may interact with your medicine. What should I watch for while using this medicine? You may get drowsy or dizzy. Do not drive, use machinery, or do anything that needs mental alertness until you know how this medicine affects you. Do not stand or sit up quickly, especially if you are an older patient. This reduces the risk of dizzy or fainting spells. In some cases, you may be given additional medicines to help with side effects. Follow all directions for their use. Call your doctor or health care professional for advice if you get a fever, chills or sore throat, or other symptoms of a cold or flu. Do not treat yourself. This drug decreases your body's ability to fight infections. Try to avoid being around people who are sick. This medicine may increase your risk to bruise or bleed. Call your doctor or health care professional if you notice any unusual bleeding. You may need blood work done while you are taking this medicine. In some patients, this medicine may cause a serious brain infection that may cause death. If you have any problems seeing, thinking, speaking, walking, or standing, tell your doctor right away. If you cannot reach your doctor, urgently seek other source of medical care. Check with your doctor or health care professional if you get an attack of severe diarrhea, nausea and vomiting, or if you sweat a lot. The loss of too much body fluid can make it dangerous for you to take this medicine. Do not become pregnant while taking this medicine or for at least 7 months after stopping it. Women should inform their doctor   if they wish to become pregnant or think they might be pregnant. Men should not father a child while taking this medicine and for at least 4 months after stopping it. There is a potential for serious side effects to an unborn child. Talk to your health care professional or pharmacist for more information. Do not breast-feed an infant while taking this medicine or for 2  months after stopping it. This medicine may interfere with the ability to have a child. You should talk with your doctor or health care professional if you are concerned about your fertility. What side effects may I notice from receiving this medicine? Side effects that you should report to your doctor or health care professional as soon as possible:  allergic reactions like skin rash, itching or hives, swelling of the face, lips, or tongue  breathing problems  changes in hearing  changes in vision  fast, irregular heartbeat  feeling faint or lightheaded, falls  pain, tingling, numbness in the hands or feet  right upper belly pain  seizures  swelling of the ankles, feet, hands  unusual bleeding or bruising  unusually weak or tired  vomiting  yellowing of the eyes or skin Side effects that usually do not require medical attention (report to your doctor or health care professional if they continue or are bothersome):  changes in emotions or moods  constipation  diarrhea  loss of appetite  headache  irritation at site where injected  nausea This list may not describe all possible side effects. Call your doctor for medical advice about side effects. You may report side effects to FDA at 1-800-FDA-1088. Where should I keep my medicine? This drug is given in a hospital or clinic and will not be stored at home. NOTE: This sheet is a summary. It may not cover all possible information. If you have questions about this medicine, talk to your doctor, pharmacist, or health care provider.  2020 Elsevier/Gold Standard (2018-04-19 16:29:31) Cyclophosphamide injection What is this medicine? CYCLOPHOSPHAMIDE (sye kloe FOSS fa mide) is a chemotherapy drug. It slows the growth of cancer cells. This medicine is used to treat many types of cancer like lymphoma, myeloma, leukemia, breast cancer, and ovarian cancer, to name a few. This medicine may be used for other purposes; ask  your health care provider or pharmacist if you have questions. COMMON BRAND NAME(S): Cytoxan, Neosar What should I tell my health care provider before I take this medicine? They need to know if you have any of these conditions:  blood disorders  history of other chemotherapy  infection  kidney disease  liver disease  recent or ongoing radiation therapy  tumors in the bone marrow  an unusual or allergic reaction to cyclophosphamide, other chemotherapy, other medicines, foods, dyes, or preservatives  pregnant or trying to get pregnant  breast-feeding How should I use this medicine? This drug is usually given as an injection into a vein or muscle or by infusion into a vein. It is administered in a hospital or clinic by a specially trained health care professional. Talk to your pediatrician regarding the use of this medicine in children. Special care may be needed. Overdosage: If you think you have taken too much of this medicine contact a poison control center or emergency room at once. NOTE: This medicine is only for you. Do not share this medicine with others. What if I miss a dose? It is important not to miss your dose. Call your doctor or health care professional if you   are unable to keep an appointment. What may interact with this medicine? This medicine may interact with the following medications:  amiodarone  amphotericin B  azathioprine  certain antiviral medicines for HIV or AIDS such as protease inhibitors (e.g., indinavir, ritonavir) and zidovudine  certain blood pressure medications such as benazepril, captopril, enalapril, fosinopril, lisinopril, moexipril, monopril, perindopril, quinapril, ramipril, trandolapril  certain cancer medications such as anthracyclines (e.g., daunorubicin, doxorubicin), busulfan, cytarabine, paclitaxel, pentostatin, tamoxifen, trastuzumab  certain diuretics such as chlorothiazide, chlorthalidone, hydrochlorothiazide, indapamide,  metolazone  certain medicines that treat or prevent blood clots like warfarin  certain muscle relaxants such as succinylcholine  cyclosporine  etanercept  indomethacin  medicines to increase blood counts like filgrastim, pegfilgrastim, sargramostim  medicines used as general anesthesia  metronidazole  natalizumab This list may not describe all possible interactions. Give your health care provider a list of all the medicines, herbs, non-prescription drugs, or dietary supplements you use. Also tell them if you smoke, drink alcohol, or use illegal drugs. Some items may interact with your medicine. What should I watch for while using this medicine? Visit your doctor for checks on your progress. This drug may make you feel generally unwell. This is not uncommon, as chemotherapy can affect healthy cells as well as cancer cells. Report any side effects. Continue your course of treatment even though you feel ill unless your doctor tells you to stop. Drink water or other fluids as directed. Urinate often, even at night. In some cases, you may be given additional medicines to help with side effects. Follow all directions for their use. Call your doctor or health care professional for advice if you get a fever, chills or sore throat, or other symptoms of a cold or flu. Do not treat yourself. This drug decreases your body's ability to fight infections. Try to avoid being around people who are sick. This medicine may increase your risk to bruise or bleed. Call your doctor or health care professional if you notice any unusual bleeding. Be careful brushing and flossing your teeth or using a toothpick because you may get an infection or bleed more easily. If you have any dental work done, tell your dentist you are receiving this medicine. You may get drowsy or dizzy. Do not drive, use machinery, or do anything that needs mental alertness until you know how this medicine affects you. Do not become  pregnant while taking this medicine or for 1 year after stopping it. Women should inform their doctor if they wish to become pregnant or think they might be pregnant. Men should not father a child while taking this medicine and for 4 months after stopping it. There is a potential for serious side effects to an unborn child. Talk to your health care professional or pharmacist for more information. Do not breast-feed an infant while taking this medicine. This medicine may interfere with the ability to have a child. This medicine has caused ovarian failure in some women. This medicine has caused reduced sperm counts in some men. You should talk with your doctor or health care professional if you are concerned about your fertility. If you are going to have surgery, tell your doctor or health care professional that you have taken this medicine. What side effects may I notice from receiving this medicine? Side effects that you should report to your doctor or health care professional as soon as possible:  allergic reactions like skin rash, itching or hives, swelling of the face, lips, or tongue  low blood   counts - this medicine may decrease the number of white blood cells, red blood cells and platelets. You may be at increased risk for infections and bleeding.  signs of infection - fever or chills, cough, sore throat, pain or difficulty passing urine  signs of decreased platelets or bleeding - bruising, pinpoint red spots on the skin, black, tarry stools, blood in the urine  signs of decreased red blood cells - unusually weak or tired, fainting spells, lightheadedness  breathing problems  dark urine  dizziness  palpitations  swelling of the ankles, feet, hands  trouble passing urine or change in the amount of urine  weight gain  yellowing of the eyes or skin Side effects that usually do not require medical attention (report to your doctor or health care professional if they continue or are  bothersome):  changes in nail or skin color  hair loss  missed menstrual periods  mouth sores  nausea, vomiting This list may not describe all possible side effects. Call your doctor for medical advice about side effects. You may report side effects to FDA at 1-800-FDA-1088. Where should I keep my medicine? This drug is given in a hospital or clinic and will not be stored at home. NOTE: This sheet is a summary. It may not cover all possible information. If you have questions about this medicine, talk to your doctor, pharmacist, or health care provider.  2020 Elsevier/Gold Standard (2012-10-22 16:22:58)

## 2019-07-27 DIAGNOSIS — E1165 Type 2 diabetes mellitus with hyperglycemia: Secondary | ICD-10-CM | POA: Diagnosis not present

## 2019-07-27 DIAGNOSIS — E1169 Type 2 diabetes mellitus with other specified complication: Secondary | ICD-10-CM | POA: Diagnosis not present

## 2019-07-27 DIAGNOSIS — E119 Type 2 diabetes mellitus without complications: Secondary | ICD-10-CM | POA: Diagnosis not present

## 2019-07-27 DIAGNOSIS — I10 Essential (primary) hypertension: Secondary | ICD-10-CM | POA: Diagnosis not present

## 2019-07-27 DIAGNOSIS — E782 Mixed hyperlipidemia: Secondary | ICD-10-CM | POA: Diagnosis not present

## 2019-08-02 DIAGNOSIS — E1165 Type 2 diabetes mellitus with hyperglycemia: Secondary | ICD-10-CM | POA: Diagnosis not present

## 2019-08-02 DIAGNOSIS — E785 Hyperlipidemia, unspecified: Secondary | ICD-10-CM | POA: Diagnosis not present

## 2019-08-02 DIAGNOSIS — D500 Iron deficiency anemia secondary to blood loss (chronic): Secondary | ICD-10-CM | POA: Diagnosis not present

## 2019-08-02 DIAGNOSIS — E1169 Type 2 diabetes mellitus with other specified complication: Secondary | ICD-10-CM | POA: Diagnosis not present

## 2019-08-02 DIAGNOSIS — E782 Mixed hyperlipidemia: Secondary | ICD-10-CM | POA: Diagnosis not present

## 2019-08-02 DIAGNOSIS — I10 Essential (primary) hypertension: Secondary | ICD-10-CM | POA: Diagnosis not present

## 2019-08-02 DIAGNOSIS — E1122 Type 2 diabetes mellitus with diabetic chronic kidney disease: Secondary | ICD-10-CM | POA: Diagnosis not present

## 2019-08-02 DIAGNOSIS — D649 Anemia, unspecified: Secondary | ICD-10-CM | POA: Diagnosis not present

## 2019-08-05 DIAGNOSIS — L03311 Cellulitis of abdominal wall: Secondary | ICD-10-CM | POA: Diagnosis not present

## 2019-08-08 ENCOUNTER — Other Ambulatory Visit: Payer: Self-pay | Admitting: *Deleted

## 2019-08-08 DIAGNOSIS — K219 Gastro-esophageal reflux disease without esophagitis: Secondary | ICD-10-CM | POA: Diagnosis not present

## 2019-08-08 DIAGNOSIS — F331 Major depressive disorder, recurrent, moderate: Secondary | ICD-10-CM | POA: Diagnosis not present

## 2019-08-08 DIAGNOSIS — E1122 Type 2 diabetes mellitus with diabetic chronic kidney disease: Secondary | ICD-10-CM | POA: Diagnosis not present

## 2019-08-08 DIAGNOSIS — N183 Chronic kidney disease, stage 3 (moderate): Secondary | ICD-10-CM | POA: Diagnosis not present

## 2019-08-08 DIAGNOSIS — I129 Hypertensive chronic kidney disease with stage 1 through stage 4 chronic kidney disease, or unspecified chronic kidney disease: Secondary | ICD-10-CM | POA: Diagnosis not present

## 2019-08-08 DIAGNOSIS — D72819 Decreased white blood cell count, unspecified: Secondary | ICD-10-CM | POA: Diagnosis not present

## 2019-08-08 DIAGNOSIS — C9000 Multiple myeloma not having achieved remission: Secondary | ICD-10-CM | POA: Diagnosis not present

## 2019-08-08 DIAGNOSIS — E782 Mixed hyperlipidemia: Secondary | ICD-10-CM | POA: Diagnosis not present

## 2019-08-08 DIAGNOSIS — D649 Anemia, unspecified: Secondary | ICD-10-CM | POA: Diagnosis not present

## 2019-08-08 DIAGNOSIS — F17210 Nicotine dependence, cigarettes, uncomplicated: Secondary | ICD-10-CM | POA: Diagnosis not present

## 2019-08-08 DIAGNOSIS — E1165 Type 2 diabetes mellitus with hyperglycemia: Secondary | ICD-10-CM | POA: Diagnosis not present

## 2019-08-08 DIAGNOSIS — E669 Obesity, unspecified: Secondary | ICD-10-CM | POA: Diagnosis not present

## 2019-08-09 ENCOUNTER — Other Ambulatory Visit: Payer: Self-pay | Admitting: *Deleted

## 2019-08-09 ENCOUNTER — Other Ambulatory Visit: Payer: Self-pay

## 2019-08-09 ENCOUNTER — Inpatient Hospital Stay: Payer: Medicare Other

## 2019-08-09 ENCOUNTER — Inpatient Hospital Stay (HOSPITAL_BASED_OUTPATIENT_CLINIC_OR_DEPARTMENT_OTHER): Payer: Medicare Other | Admitting: Hematology & Oncology

## 2019-08-09 VITALS — BP 142/91 | HR 76 | Temp 97.6°F | Resp 20 | Wt 190.8 lb

## 2019-08-09 DIAGNOSIS — C9000 Multiple myeloma not having achieved remission: Secondary | ICD-10-CM

## 2019-08-09 DIAGNOSIS — Z5111 Encounter for antineoplastic chemotherapy: Secondary | ICD-10-CM | POA: Diagnosis not present

## 2019-08-09 DIAGNOSIS — C9002 Multiple myeloma in relapse: Secondary | ICD-10-CM

## 2019-08-09 DIAGNOSIS — L02211 Cutaneous abscess of abdominal wall: Secondary | ICD-10-CM | POA: Diagnosis not present

## 2019-08-09 DIAGNOSIS — Z923 Personal history of irradiation: Secondary | ICD-10-CM | POA: Diagnosis not present

## 2019-08-09 DIAGNOSIS — Z8614 Personal history of Methicillin resistant Staphylococcus aureus infection: Secondary | ICD-10-CM | POA: Diagnosis not present

## 2019-08-09 DIAGNOSIS — Z79899 Other long term (current) drug therapy: Secondary | ICD-10-CM | POA: Diagnosis not present

## 2019-08-09 LAB — CBC WITH DIFFERENTIAL (CANCER CENTER ONLY)
Abs Immature Granulocytes: 0.01 10*3/uL (ref 0.00–0.07)
Basophils Absolute: 0 10*3/uL (ref 0.0–0.1)
Basophils Relative: 1 %
Eosinophils Absolute: 0 10*3/uL (ref 0.0–0.5)
Eosinophils Relative: 1 %
HCT: 26.6 % — ABNORMAL LOW (ref 36.0–46.0)
Hemoglobin: 8.7 g/dL — ABNORMAL LOW (ref 12.0–15.0)
Immature Granulocytes: 1 %
Lymphocytes Relative: 17 %
Lymphs Abs: 0.3 10*3/uL — ABNORMAL LOW (ref 0.7–4.0)
MCH: 33 pg (ref 26.0–34.0)
MCHC: 32.7 g/dL (ref 30.0–36.0)
MCV: 100.8 fL — ABNORMAL HIGH (ref 80.0–100.0)
Monocytes Absolute: 0.3 10*3/uL (ref 0.1–1.0)
Monocytes Relative: 12 %
Neutro Abs: 1.4 10*3/uL — ABNORMAL LOW (ref 1.7–7.7)
Neutrophils Relative %: 68 %
Platelet Count: 150 10*3/uL (ref 150–400)
RBC: 2.64 MIL/uL — ABNORMAL LOW (ref 3.87–5.11)
RDW: 21.1 % — ABNORMAL HIGH (ref 11.5–15.5)
WBC Count: 2 10*3/uL — ABNORMAL LOW (ref 4.0–10.5)
nRBC: 0 % (ref 0.0–0.2)

## 2019-08-09 LAB — CMP (CANCER CENTER ONLY)
ALT: 11 U/L (ref 0–44)
AST: 11 U/L — ABNORMAL LOW (ref 15–41)
Albumin: 3.4 g/dL — ABNORMAL LOW (ref 3.5–5.0)
Alkaline Phosphatase: 134 U/L — ABNORMAL HIGH (ref 38–126)
Anion gap: 9 (ref 5–15)
BUN: 14 mg/dL (ref 8–23)
CO2: 23 mmol/L (ref 22–32)
Calcium: 8.5 mg/dL — ABNORMAL LOW (ref 8.9–10.3)
Chloride: 105 mmol/L (ref 98–111)
Creatinine: 0.92 mg/dL (ref 0.44–1.00)
GFR, Est AFR Am: >60 mL/min (ref >60)
GFR, Estimated: >60 mL/min (ref >60)
Glucose, Bld: 305 mg/dL — ABNORMAL HIGH (ref 70–99)
Potassium: 3.7 mmol/L (ref 3.5–5.1)
Sodium: 137 mmol/L (ref 135–145)
Total Bilirubin: 0.3 mg/dL (ref 0.3–1.2)
Total Protein: 6.8 g/dL (ref 6.5–8.1)

## 2019-08-09 MED ORDER — SODIUM CHLORIDE 0.9 % IV SOLN
INTRAVENOUS | Status: DC
  Administered 2019-08-09: 13:00:00 via INTRAVENOUS
  Filled 2019-08-09: qty 250

## 2019-08-09 MED ORDER — SODIUM CHLORIDE 0.9 % IV SOLN
500.0000 mg | Freq: Every day | INTRAVENOUS | Status: DC
  Administered 2019-08-09: 500 mg via INTRAVENOUS
  Filled 2019-08-09: qty 10

## 2019-08-09 MED ORDER — DAPTOMYCIN IV (FOR PTA / DISCHARGE USE ONLY): 500.0000 mg | INTRAVENOUS | Status: DC

## 2019-08-09 NOTE — Progress Notes (Signed)
Hematology and Oncology Follow Up Visit  Vicki Moon [id number] [birth year] 68 y.o. 08/09/2019   Principle Diagnosis:   IgG Kappa myeloma -- 1p-, 13q-, 16q- and t(11:14)  Current Therapy:    RVD -- start on 05/09/2019 -- d/c revlimid on 06/14/2019 -- d/c on 06/28/2019  CyBorD -- start cycle #1 on 07/12/2019  S/P Kyphoplasty at L2  XRT to lumbar spine -- to be done in [place]  Xgeva 120 mg sq q 3 months -- next dose on 07/2019     Interim History:  Vicki Moon is back for follow-up.  Unfortunately, she has developed another abscess.  This is on her right abdominal wall.  Apparently is where she had an insulin pump.  This area is quite erythematous.  There is some slight fluctuation.  There is nothing that can be expressed.  She has had no fever.  She is on some oral antibiotic for this.   Unfortunately, we would not be able to give her chemotherapy today.  Thankfully, her myeloma is not that bad.  Her last M spike was 1.2 g/dL.  These abscesses really have been a problem for her.  She actually has been hospitalized several times for abscesses that had to be surgically open.  I want to try to avoid her being hospitalized.  I want to try to give her some antibiotic in the office.  I think Cubicin would be reasonable.  She has had MRSA in all of her cultures.  I think Cubicin would be very helpful.  We will give her 4 doses of Cubicin and hopefully this abscess will improve.  I am glad that her back is no longer hurting.  When we first saw her, she cannot walk because her back was assessed pain.  This is improved greatly with radiation and kyphoplasty.  She clearly is not a candidate for a transplant.  We just have to try to treat this the best we can without using transplant as a intervention.  Overall, I would say her performance status is ECOG 1.    Medications:  Current Outpatient Medications:  .  ALPRAZolam (XANAX) 0.5 MG tablet, Take 0.5 mg by mouth daily., Disp: ,  Rfl:  .  atorvastatin (LIPITOR) 10 MG tablet, Take 10 mg by mouth daily., Disp: , Rfl: 5 .  B-D ULTRAFINE III SHORT PEN 31G X 8 MM MISC, USE ONE PEN NEEDLE 4 TIMES DAILY, Disp: 100 each, Rfl: 5 .  Continuous Blood Gluc Sensor ([place]) MISC, Use one sensor every 10 days., Disp: 3 each, Rfl: 2 .  glipiZIDE (GLUCOTROL XL) 10 MG 24 hr tablet, TAKE 1 TABLET BY MOUTH ONCE DAILY FOR SUGARS, Disp: , Rfl:  .  insulin lispro (HUMALOG KWIKPEN) 100 UNIT/ML KiwkPen, Inject 0.22-0.28 mLs (22-28 Units total) into the skin 3 (three) times daily with meals., Disp: 25 mL, Rfl: 2 .  latanoprost (XALATAN) 0.005 % ophthalmic solution, Place 1 drop into both eyes at bedtime., Disp: , Rfl:  .  NOVOLOG 100 UNIT/ML injection, INJECT 35 UNITS SUBCUTANEOUSLY WITH MEALS, Disp: , Rfl:  .  PARoxetine (PAXIL) 30 MG tablet, Take 30 mg by mouth every morning., Disp: , Rfl: 3 .  potassium chloride SA (K-DUR) 20 MEQ tablet, Take 2 tablets (40 mEq total) by mouth daily., Disp: 60 tablet, Rfl: 1  Allergies:  Allergies  Allergen Reactions  . Metformin Nausea Only    "severe GI"    Past Medical History, Surgical history, Social history, and Family  History were reviewed and updated.  Review of Systems: Review of Systems  Constitutional: Negative.   HENT:  Negative.   Eyes: Negative.   Respiratory: Negative.   Cardiovascular: Negative.   Gastrointestinal: Negative.   Endocrine: Negative.   Genitourinary: Negative.    Musculoskeletal: Positive for back pain.  Skin: Negative.   Neurological: Negative.   Hematological: Negative.   Psychiatric/Behavioral: Negative.     Physical Exam:  weight is 190 lb 12 oz (86.5 kg). Her temperature is 97.6 F (36.4 C). Her blood pressure is 142/91 (abnormal) and her pulse is 76. Her respiration is 20 and oxygen saturation is 100%.   Wt Readings from Last 3 Encounters:  08/09/19 190 lb 12 oz (86.5 kg)  07/12/19 191 lb 6.4 oz (86.8 kg)  06/28/19 189 lb 6.4 oz  (85.9 kg)    Physical Exam Vitals signs reviewed.  HENT:     Head: Normocephalic and atraumatic.  Eyes:     Pupils: Pupils are equal, round, and reactive to light.  Neck:     Musculoskeletal: Normal range of motion.  Cardiovascular:     Rate and Rhythm: Normal rate and regular rhythm.     Heart sounds: Normal heart sounds.  Pulmonary:     Effort: Pulmonary effort is normal.     Breath sounds: Normal breath sounds.  Abdominal:     General: Bowel sounds are normal.     Palpations: Abdomen is soft.  Musculoskeletal: Normal range of motion.        General: No tenderness or deformity.  Lymphadenopathy:     Cervical: No cervical adenopathy.  Skin:    General: Skin is warm and dry.     Findings: No erythema or rash.  Neurological:     Mental Status: She is alert and oriented to person, place, and time.  Psychiatric:        Behavior: Behavior normal.        Thought Content: Thought content normal.        Judgment: Judgment normal.      Lab Results  Component Value Date   WBC 2.0 (L) 08/09/2019   HGB 8.7 (L) 08/09/2019   HCT 26.6 (L) 08/09/2019   MCV 100.8 (H) 08/09/2019   PLT 150 08/09/2019     Chemistry      Component Value Date/Time   NA 137 08/09/2019 1052   K 3.7 08/09/2019 1052   CL 105 08/09/2019 1052   CO2 23 08/09/2019 1052   BUN 14 08/09/2019 1052   CREATININE 0.92 08/09/2019 1052   CREATININE 0.65 01/12/2018 1004      Component Value Date/Time   CALCIUM 8.5 (L) 08/09/2019 1052   ALKPHOS 134 (H) 08/09/2019 1052   AST 11 (L) 08/09/2019 1052   ALT 11 08/09/2019 1052   BILITOT 0.3 08/09/2019 1052       Impression and Plan: Vicki Moon is a 68 year old white female.  She has a new diagnosis of IgG kappa myeloma.  I think her cytogenetics placer in the high risk group.  I just do not feel that chemotherapy will be safe for her today.  We will delay her chemotherapy for a week.  This appointment took over 35 minutes.  I had to make sure that we  addressed the abscess.  This, for today, is more important than treating her myeloma.  She does not want to be in the hospital.  I think we can keep her out of the hospital and treat this with IV  antibiotics the office.  We will have her come back in a week for follow-up and hopefully treatment.    [name], MD 8/18/202012:04 PM

## 2019-08-09 NOTE — Patient Instructions (Signed)
Daptomycin injection What is this medicine? DAPTOMYCIN (DAP toe MYE sin) is a lipopeptide antibiotic. It is used to treat certain kinds of bacterial infections. It will not work for colds, flu, or other viral infections. This medicine may be used for other purposes; ask your health care provider or pharmacist if you have questions. COMMON BRAND NAME(S): Cubicin, Cubicin RF What should I tell my health care provider before I take this medicine? They need to know if you have any of these conditions:  kidney disease  an unusual or allergic reaction to daptomycin, other medicines, foods, dyes, or preservatives  pregnant or trying to get pregnant  breast-feeding How should I use this medicine? This medicine is for infusion into a vein. It is usually given by a health care professional in a hospital or clinic setting. If you get this medicine at home, you will be taught how to prepare and give this medicine. Use exactly as directed. Take your medicine at regular intervals. Do not take your medicine more often than directed. Take all of your medicine as directed even if you think you are better. Do not skip doses or stop your medicine early. It is important that you put your used needles and syringes in a special sharps container. Do not put them in a trash can. If you do not have a sharps container, call your pharmacist or healthcare provider to get one. Talk to your pediatrician regarding the use of this medicine in children. While this drug may be prescribed for children as young as 1 year for selected conditions, precautions do apply. Overdosage: If you think you have taken too much of this medicine contact a poison control center or emergency room at once. NOTE: This medicine is only for you. Do not share this medicine with others. What if I miss a dose? If you miss a dose, take it as soon as you can. If it is almost time for your next dose, take only that dose. Do not take double or extra  doses. What may interact with this medicine?  birth control pills  some antibiotics like tobramycin This list may not describe all possible interactions. Give your health care provider a list of all the medicines, herbs, non-prescription drugs, or dietary supplements you use. Also tell them if you smoke, drink alcohol, or use illegal drugs. Some items may interact with your medicine. What should I watch for while using this medicine? Your condition will be monitored carefully while you are receiving this medicine. Do not treat diarrhea with over the counter products. Contact your doctor if you have diarrhea that lasts more than 2 days or if it is severe and watery. What side effects may I notice from receiving this medicine? Side effects that you should report to your doctor or health care professional as soon as possible:  allergic reactions like skin rash, itching or hives, swelling of the face, lips, or tongue  breathing problems  fever, infection  high or low blood pressure  muscle pain  numb or tingling pain  trouble passing urine or change in the amount of urine  unusually tired or weak  vomiting Side effects that usually do not require medical attention (report to your doctor or health care professional if they continue or are bothersome):  constipation or diarrhea  trouble sleeping  headache  nausea  stomach upset This list may not describe all possible side effects. Call your doctor for medical advice about side effects. You may report side effects to  FDA at 1-800-FDA-1088. Where should I keep my medicine? Keep out of the reach of children. If you are using this medicine at home, you will be instructed on how to store this medicine. Throw away any unused medicine after the expiration date on the label. NOTE: This sheet is a summary. It may not cover all possible information. If you have questions about this medicine, talk to your doctor, pharmacist, or health  care provider.  2020 Elsevier/Gold Standard (2016-03-21 11:21:16)

## 2019-08-10 ENCOUNTER — Other Ambulatory Visit: Payer: Self-pay

## 2019-08-10 ENCOUNTER — Inpatient Hospital Stay: Payer: Medicare Other

## 2019-08-10 VITALS — BP 156/58 | HR 74 | Temp 96.9°F | Resp 17

## 2019-08-10 DIAGNOSIS — L02211 Cutaneous abscess of abdominal wall: Secondary | ICD-10-CM | POA: Diagnosis not present

## 2019-08-10 DIAGNOSIS — Z79899 Other long term (current) drug therapy: Secondary | ICD-10-CM | POA: Diagnosis not present

## 2019-08-10 DIAGNOSIS — C9002 Multiple myeloma in relapse: Secondary | ICD-10-CM

## 2019-08-10 DIAGNOSIS — Z5111 Encounter for antineoplastic chemotherapy: Secondary | ICD-10-CM | POA: Diagnosis not present

## 2019-08-10 DIAGNOSIS — Z923 Personal history of irradiation: Secondary | ICD-10-CM | POA: Diagnosis not present

## 2019-08-10 DIAGNOSIS — Z8614 Personal history of Methicillin resistant Staphylococcus aureus infection: Secondary | ICD-10-CM | POA: Diagnosis not present

## 2019-08-10 DIAGNOSIS — C9000 Multiple myeloma not having achieved remission: Secondary | ICD-10-CM | POA: Diagnosis not present

## 2019-08-10 LAB — KAPPA/LAMBDA LIGHT CHAINS
Kappa free light chain: 239.5 mg/L — ABNORMAL HIGH (ref 3.3–19.4)
Kappa, lambda light chain ratio: 34.71 — ABNORMAL HIGH (ref 0.26–1.65)
Lambda free light chains: 6.9 mg/L (ref 5.7–26.3)

## 2019-08-10 LAB — IGG, IGA, IGM
IgA: 27 mg/dL — ABNORMAL LOW (ref 87–352)
IgG (Immunoglobin G), Serum: 1883 mg/dL — ABNORMAL HIGH (ref 586–1602)
IgM (Immunoglobulin M), Srm: 28 mg/dL (ref 26–217)

## 2019-08-10 MED ORDER — SODIUM CHLORIDE 0.9 % IV SOLN
500.0000 mg | Freq: Every day | INTRAVENOUS | Status: DC
  Administered 2019-08-10: 500 mg via INTRAVENOUS
  Filled 2019-08-10: qty 10

## 2019-08-10 NOTE — Patient Instructions (Signed)
Daptomycin injection What is this medicine? DAPTOMYCIN (DAP toe MYE sin) is a lipopeptide antibiotic. It is used to treat certain kinds of bacterial infections. It will not work for colds, flu, or other viral infections. This medicine may be used for other purposes; ask your health care provider or pharmacist if you have questions. COMMON BRAND NAME(S): Cubicin, Cubicin RF What should I tell my health care provider before I take this medicine? They need to know if you have any of these conditions:  kidney disease  an unusual or allergic reaction to daptomycin, other medicines, foods, dyes, or preservatives  pregnant or trying to get pregnant  breast-feeding How should I use this medicine? This medicine is for infusion into a vein. It is usually given by a health care professional in a hospital or clinic setting. If you get this medicine at home, you will be taught how to prepare and give this medicine. Use exactly as directed. Take your medicine at regular intervals. Do not take your medicine more often than directed. Take all of your medicine as directed even if you think you are better. Do not skip doses or stop your medicine early. It is important that you put your used needles and syringes in a special sharps container. Do not put them in a trash can. If you do not have a sharps container, call your pharmacist or healthcare provider to get one. Talk to your pediatrician regarding the use of this medicine in children. While this drug may be prescribed for children as young as 1 year for selected conditions, precautions do apply. Overdosage: If you think you have taken too much of this medicine contact a poison control center or emergency room at once. NOTE: This medicine is only for you. Do not share this medicine with others. What if I miss a dose? If you miss a dose, take it as soon as you can. If it is almost time for your next dose, take only that dose. Do not take double or extra  doses. What may interact with this medicine?  birth control pills  some antibiotics like tobramycin This list may not describe all possible interactions. Give your health care provider a list of all the medicines, herbs, non-prescription drugs, or dietary supplements you use. Also tell them if you smoke, drink alcohol, or use illegal drugs. Some items may interact with your medicine. What should I watch for while using this medicine? Your condition will be monitored carefully while you are receiving this medicine. Do not treat diarrhea with over the counter products. Contact your doctor if you have diarrhea that lasts more than 2 days or if it is severe and watery. What side effects may I notice from receiving this medicine? Side effects that you should report to your doctor or health care professional as soon as possible:  allergic reactions like skin rash, itching or hives, swelling of the face, lips, or tongue  breathing problems  fever, infection  high or low blood pressure  muscle pain  numb or tingling pain  trouble passing urine or change in the amount of urine  unusually tired or weak  vomiting Side effects that usually do not require medical attention (report to your doctor or health care professional if they continue or are bothersome):  constipation or diarrhea  trouble sleeping  headache  nausea  stomach upset This list may not describe all possible side effects. Call your doctor for medical advice about side effects. You may report side effects to  FDA at 1-800-FDA-1088. Where should I keep my medicine? Keep out of the reach of children. If you are using this medicine at home, you will be instructed on how to store this medicine. Throw away any unused medicine after the expiration date on the label. NOTE: This sheet is a summary. It may not cover all possible information. If you have questions about this medicine, talk to your doctor, pharmacist, or health  care provider.  2020 Elsevier/Gold Standard (2016-03-21 11:21:16)

## 2019-08-11 ENCOUNTER — Inpatient Hospital Stay: Payer: Medicare Other

## 2019-08-11 ENCOUNTER — Other Ambulatory Visit: Payer: Self-pay

## 2019-08-11 VITALS — BP 134/50 | HR 65 | Temp 97.1°F | Resp 18

## 2019-08-11 DIAGNOSIS — Z79899 Other long term (current) drug therapy: Secondary | ICD-10-CM | POA: Diagnosis not present

## 2019-08-11 DIAGNOSIS — L02211 Cutaneous abscess of abdominal wall: Secondary | ICD-10-CM | POA: Diagnosis not present

## 2019-08-11 DIAGNOSIS — C9002 Multiple myeloma in relapse: Secondary | ICD-10-CM

## 2019-08-11 DIAGNOSIS — Z8614 Personal history of Methicillin resistant Staphylococcus aureus infection: Secondary | ICD-10-CM | POA: Diagnosis not present

## 2019-08-11 DIAGNOSIS — Z923 Personal history of irradiation: Secondary | ICD-10-CM | POA: Diagnosis not present

## 2019-08-11 DIAGNOSIS — Z5111 Encounter for antineoplastic chemotherapy: Secondary | ICD-10-CM | POA: Diagnosis not present

## 2019-08-11 DIAGNOSIS — C9000 Multiple myeloma not having achieved remission: Secondary | ICD-10-CM | POA: Diagnosis not present

## 2019-08-11 LAB — IMMUNOFIXATION REFLEX, SERUM
IgA: 28 mg/dL — ABNORMAL LOW (ref 87–352)
IgG (Immunoglobin G), Serum: 1884 mg/dL — ABNORMAL HIGH (ref 586–1602)
IgM (Immunoglobulin M), Srm: 29 mg/dL (ref 26–217)

## 2019-08-11 LAB — PROTEIN ELECTROPHORESIS, SERUM, WITH REFLEX
A/G Ratio: 0.8 (ref 0.7–1.7)
Albumin ELP: 3 g/dL (ref 2.9–4.4)
Alpha-1-Globulin: 0.3 g/dL (ref 0.0–0.4)
Alpha-2-Globulin: 1.2 g/dL — ABNORMAL HIGH (ref 0.4–1.0)
Beta Globulin: 0.8 g/dL (ref 0.7–1.3)
Gamma Globulin: 1.6 g/dL (ref 0.4–1.8)
Globulin, Total: 3.9 g/dL (ref 2.2–3.9)
M-Spike, %: 1.3 g/dL — ABNORMAL HIGH
SPEP Interpretation: 0
Total Protein ELP: 6.9 g/dL (ref 6.0–8.5)

## 2019-08-11 MED ORDER — SODIUM CHLORIDE 0.9 % IV SOLN
INTRAVENOUS | Status: DC
  Administered 2019-08-11: 13:00:00 via INTRAVENOUS
  Filled 2019-08-11: qty 250

## 2019-08-11 MED ORDER — SODIUM CHLORIDE 0.9 % IV SOLN
500.0000 mg | Freq: Every day | INTRAVENOUS | Status: DC
  Administered 2019-08-11: 13:00:00 500 mg via INTRAVENOUS
  Filled 2019-08-11: qty 10

## 2019-08-11 NOTE — Patient Instructions (Signed)
Daptomycin injection (Cubicin) What is this medicine? DAPTOMYCIN (DAP toe MYE sin) is a lipopeptide antibiotic. It is used to treat certain kinds of bacterial infections. It will not work for colds, flu, or other viral infections. This medicine may be used for other purposes; ask your health care provider or pharmacist if you have questions. COMMON BRAND NAME(S): Cubicin, Cubicin RF What should I tell my health care provider before I take this medicine? They need to know if you have any of these conditions:  kidney disease  an unusual or allergic reaction to daptomycin, other medicines, foods, dyes, or preservatives  pregnant or trying to get pregnant  breast-feeding How should I use this medicine? This medicine is for infusion into a vein. It is usually given by a health care professional in a hospital or clinic setting. If you get this medicine at home, you will be taught how to prepare and give this medicine. Use exactly as directed. Take your medicine at regular intervals. Do not take your medicine more often than directed. Take all of your medicine as directed even if you think you are better. Do not skip doses or stop your medicine early. It is important that you put your used needles and syringes in a special sharps container. Do not put them in a trash can. If you do not have a sharps container, call your pharmacist or healthcare provider to get one. Talk to your pediatrician regarding the use of this medicine in children. While this drug may be prescribed for children as young as 1 year for selected conditions, precautions do apply. Overdosage: If you think you have taken too much of this medicine contact a poison control center or emergency room at once. NOTE: This medicine is only for you. Do not share this medicine with others. What if I miss a dose? If you miss a dose, take it as soon as you can. If it is almost time for your next dose, take only that dose. Do not take double or  extra doses. What may interact with this medicine?  birth control pills  some antibiotics like tobramycin This list may not describe all possible interactions. Give your health care provider a list of all the medicines, herbs, non-prescription drugs, or dietary supplements you use. Also tell them if you smoke, drink alcohol, or use illegal drugs. Some items may interact with your medicine. What should I watch for while using this medicine? Your condition will be monitored carefully while you are receiving this medicine. Do not treat diarrhea with over the counter products. Contact your doctor if you have diarrhea that lasts more than 2 days or if it is severe and watery. What side effects may I notice from receiving this medicine? Side effects that you should report to your doctor or health care professional as soon as possible:  allergic reactions like skin rash, itching or hives, swelling of the face, lips, or tongue  breathing problems  fever, infection  high or low blood pressure  muscle pain  numb or tingling pain  trouble passing urine or change in the amount of urine  unusually tired or weak  vomiting Side effects that usually do not require medical attention (report to your doctor or health care professional if they continue or are bothersome):  constipation or diarrhea  trouble sleeping  headache  nausea  stomach upset This list may not describe all possible side effects. Call your doctor for medical advice about side effects. You may report side effects  to FDA at 1-800-FDA-1088. Where should I keep my medicine? Keep out of the reach of children. If you are using this medicine at home, you will be instructed on how to store this medicine. Throw away any unused medicine after the expiration date on the label. NOTE: This sheet is a summary. It may not cover all possible information. If you have questions about this medicine, talk to your doctor, pharmacist, or  health care provider.  2020 Elsevier/Gold Standard (2016-03-21 11:21:16)

## 2019-08-12 ENCOUNTER — Inpatient Hospital Stay: Payer: Medicare Other

## 2019-08-12 VITALS — BP 153/69 | HR 75 | Temp 97.5°F | Resp 18

## 2019-08-12 DIAGNOSIS — Z79899 Other long term (current) drug therapy: Secondary | ICD-10-CM | POA: Diagnosis not present

## 2019-08-12 DIAGNOSIS — Z8614 Personal history of Methicillin resistant Staphylococcus aureus infection: Secondary | ICD-10-CM | POA: Diagnosis not present

## 2019-08-12 DIAGNOSIS — L02211 Cutaneous abscess of abdominal wall: Secondary | ICD-10-CM | POA: Diagnosis not present

## 2019-08-12 DIAGNOSIS — Z5111 Encounter for antineoplastic chemotherapy: Secondary | ICD-10-CM | POA: Diagnosis not present

## 2019-08-12 DIAGNOSIS — Z923 Personal history of irradiation: Secondary | ICD-10-CM | POA: Diagnosis not present

## 2019-08-12 DIAGNOSIS — L03311 Cellulitis of abdominal wall: Secondary | ICD-10-CM | POA: Diagnosis not present

## 2019-08-12 DIAGNOSIS — C9000 Multiple myeloma not having achieved remission: Secondary | ICD-10-CM | POA: Diagnosis not present

## 2019-08-12 DIAGNOSIS — C9002 Multiple myeloma in relapse: Secondary | ICD-10-CM

## 2019-08-12 MED ORDER — SODIUM CHLORIDE 0.9 % IV SOLN
500.0000 mg | Freq: Every day | INTRAVENOUS | Status: DC
  Administered 2019-08-12: 500 mg via INTRAVENOUS
  Filled 2019-08-12: qty 10

## 2019-08-12 MED ORDER — SODIUM CHLORIDE 0.9 % IV SOLN
INTRAVENOUS | Status: DC
  Administered 2019-08-12: 14:00:00 via INTRAVENOUS
  Filled 2019-08-12: qty 250

## 2019-08-12 MED ORDER — TBO-FILGRASTIM 480 MCG/0.8ML ~~LOC~~ SOSY: 480.0000 ug | PREFILLED_SYRINGE | Freq: Once | SUBCUTANEOUS | Status: DC

## 2019-08-12 NOTE — Patient Instructions (Signed)
Daptomycin injection What is this medicine? DAPTOMYCIN (DAP toe MYE sin) is a lipopeptide antibiotic. It is used to treat certain kinds of bacterial infections. It will not work for colds, flu, or other viral infections. This medicine may be used for other purposes; ask your health care provider or pharmacist if you have questions. COMMON BRAND NAME(S): Cubicin, Cubicin RF What should I tell my health care provider before I take this medicine? They need to know if you have any of these conditions:  kidney disease  an unusual or allergic reaction to daptomycin, other medicines, foods, dyes, or preservatives  pregnant or trying to get pregnant  breast-feeding How should I use this medicine? This medicine is for infusion into a vein. It is usually given by a health care professional in a hospital or clinic setting. If you get this medicine at home, you will be taught how to prepare and give this medicine. Use exactly as directed. Take your medicine at regular intervals. Do not take your medicine more often than directed. Take all of your medicine as directed even if you think you are better. Do not skip doses or stop your medicine early. It is important that you put your used needles and syringes in a special sharps container. Do not put them in a trash can. If you do not have a sharps container, call your pharmacist or healthcare provider to get one. Talk to your pediatrician regarding the use of this medicine in children. While this drug may be prescribed for children as young as 1 year for selected conditions, precautions do apply. Overdosage: If you think you have taken too much of this medicine contact a poison control center or emergency room at once. NOTE: This medicine is only for you. Do not share this medicine with others. What if I miss a dose? If you miss a dose, take it as soon as you can. If it is almost time for your next dose, take only that dose. Do not take double or extra  doses. What may interact with this medicine?  birth control pills  some antibiotics like tobramycin This list may not describe all possible interactions. Give your health care provider a list of all the medicines, herbs, non-prescription drugs, or dietary supplements you use. Also tell them if you smoke, drink alcohol, or use illegal drugs. Some items may interact with your medicine. What should I watch for while using this medicine? Your condition will be monitored carefully while you are receiving this medicine. Do not treat diarrhea with over the counter products. Contact your doctor if you have diarrhea that lasts more than 2 days or if it is severe and watery. What side effects may I notice from receiving this medicine? Side effects that you should report to your doctor or health care professional as soon as possible:  allergic reactions like skin rash, itching or hives, swelling of the face, lips, or tongue  breathing problems  fever, infection  high or low blood pressure  muscle pain  numb or tingling pain  trouble passing urine or change in the amount of urine  unusually tired or weak  vomiting Side effects that usually do not require medical attention (report to your doctor or health care professional if they continue or are bothersome):  constipation or diarrhea  trouble sleeping  headache  nausea  stomach upset This list may not describe all possible side effects. Call your doctor for medical advice about side effects. You may report side effects to   FDA at 1-800-FDA-1088. Where should I keep my medicine? Keep out of the reach of children. If you are using this medicine at home, you will be instructed on how to store this medicine. Throw away any unused medicine after the expiration date on the label. NOTE: This sheet is a summary. It may not cover all possible information. If you have questions about this medicine, talk to your doctor, pharmacist, or health  care provider.  2020 Elsevier/Gold Standard (2016-03-21 11:21:16)  

## 2019-08-15 ENCOUNTER — Telehealth: Payer: Self-pay | Admitting: *Deleted

## 2019-08-15 NOTE — Telephone Encounter (Signed)
Call received from patient stating that she needs to cancel appt tomorrow, d/t she has an appt with Dr. Arnoldo Morale in [place] to "clean out her side."  Informed pt that I would speak with Dr. [name] and call her back with MD orders.

## 2019-08-15 NOTE — Telephone Encounter (Signed)
Call placed to patient to notify her that Dr. [name] would like to cancel her appts on 08/16/19, 08/23/19 and add MD visit to appts on 08/30/19.  Patient appreciative of call and has no further questions or concerns at this time.

## 2019-08-16 ENCOUNTER — Inpatient Hospital Stay: Payer: Medicare Other

## 2019-08-16 ENCOUNTER — Ambulatory Visit (INDEPENDENT_AMBULATORY_CARE_PROVIDER_SITE_OTHER): Payer: Medicare Other | Admitting: General Surgery

## 2019-08-16 ENCOUNTER — Other Ambulatory Visit: Payer: Self-pay

## 2019-08-16 ENCOUNTER — Inpatient Hospital Stay: Payer: Medicare Other | Admitting: Family

## 2019-08-16 ENCOUNTER — Encounter: Payer: Self-pay | Admitting: General Surgery

## 2019-08-16 VITALS — BP 156/76 | HR 84 | Temp 96.6°F | Resp 16 | Ht 62.0 in | Wt 196.0 lb

## 2019-08-16 DIAGNOSIS — L02211 Cutaneous abscess of abdominal wall: Secondary | ICD-10-CM | POA: Diagnosis not present

## 2019-08-16 NOTE — Progress Notes (Signed)
Vicki Moon; [id number]; 1951-09-11   HPI Patient is a 68 year old white female with multiple myeloma and diabetes mellitus who was referred to my care by Dr. Wende Neighbors and Pablo Lawrence for evaluation treatment of the abdominal wall abscess.  Patient states that she developed an abscess at the site of her insulin pump.  This was incised and drained in Dr. Juel Burrow office 4 days ago.  She has had some purulent drainage since.  I was asked to see her for surgical consultation.  She denies any current fever or chills.  She has received both oral and IV antibiotics.  She has a pain level of 5 out of 10 at this site, though this is improved.  She states she is only had clear yellow fluid draining from the wound. Past Medical History:  Diagnosis Date  . Diabetes mellitus without complication (HCC)    Type 2 IDDM x 5 years  . Glaucoma   . Goals of care, counseling/discussion 04/29/2019  . Herniated lumbar intervertebral disc 06/2014  . Multiple myeloma ([place]) 04/29/2019  . PONV (postoperative nausea and vomiting)     Past Surgical History:  Procedure Laterality Date  . CHOLECYSTECTOMY    . COLONOSCOPY N/A 06/28/2015   Procedure: COLONOSCOPY;  Surgeon: Rogene Houston, MD;  Location: [facility];  Service: Endoscopy;  Laterality: N/A;  155  . EYE SURGERY    . IR BONE TUMOR(S)RF ABLATION  04/28/2019  . IR KYPHO LUMBAR INC FX REDUCE BONE BX UNI/BIL CANNULATION INC/IMAGING  04/28/2019  . IR RADIOLOGIST EVAL & MGMT  04/22/2019  . LUMBAR LAMINECTOMY Right 07/12/2014   Procedure: LUMBAR FIVE TO SACRAL ONE MICRODISCECTOMY;  Surgeon: Marybelle Killings, MD;  Location: [facility];  Service: Orthopedics;  Laterality: Right;  . TUBAL LIGATION    . VITRECTOMY 25 GAUGE WITH SCLERAL BUCKLE Left 07/29/2017   Procedure: RETINAL DETACHMENT REPAIR LEFT EYE WITH PARSPLANA VITRECTOMY, AIR FLUID EXCHANGE, ENDO LASTER, DRAINAGE OF SUBRETINAL FLUID,ENDOLASER PPV /25 GAUGE;  Surgeon: Jalene Mullet, MD;  Location: [facility];  Service:  Ophthalmology;  Laterality: Left;    Family History  Problem Relation Age of Onset  . Hypertension Mother   . Stroke Mother   . Cancer Father        brain tumor  . Diabetes Father     Current Outpatient Medications on File Prior to Visit  Medication Sig Dispense Refill  . ALPRAZolam (XANAX) 0.5 MG tablet Take 0.5 mg by mouth daily.    Marland Kitchen atorvastatin (LIPITOR) 10 MG tablet Take 10 mg by mouth daily.  5  . B-D ULTRAFINE III SHORT PEN 31G X 8 MM MISC USE ONE PEN NEEDLE 4 TIMES DAILY 100 each 5  . Continuous Blood Gluc Sensor (FREESTYLE LIBRE SENSOR SYSTEM) MISC Use one sensor every 10 days. 3 each 2  . doxycycline (VIBRAMYCIN) 100 MG capsule TAKE 1 CAPSULE BY MOUTH TWICE DAILY FOR 7 DAYS    . glipiZIDE (GLUCOTROL XL) 10 MG 24 hr tablet TAKE 1 TABLET BY MOUTH ONCE DAILY FOR SUGARS    . insulin lispro (HUMALOG KWIKPEN) 100 UNIT/ML KiwkPen Inject 0.22-0.28 mLs (22-28 Units total) into the skin 3 (three) times daily with meals. 25 mL 2  . latanoprost (XALATAN) 0.005 % ophthalmic solution Place 1 drop into both eyes at bedtime.    Marland Kitchen NOVOLOG 100 UNIT/ML injection INJECT 35 UNITS SUBCUTANEOUSLY WITH MEALS    . PARoxetine (PAXIL) 30 MG tablet Take 30 mg by mouth every morning.  3  .  potassium chloride SA (K-DUR) 20 MEQ tablet Take 2 tablets (40 mEq total) by mouth daily. 60 tablet 1  . [DISCONTINUED] prochlorperazine (COMPAZINE) 10 MG tablet Take 1 tablet (10 mg total) by mouth every 6 (six) hours as needed (Nausea or vomiting). 30 tablet 1   No current facility-administered medications on file prior to visit.     Allergies  Allergen Reactions  . Metformin Nausea Only    "severe GI"    Social History   Substance and Sexual Activity  Alcohol Use No  . Alcohol/week: 0.0 standard drinks    Social History   Tobacco Use  Smoking Status Former Smoker  . Packs/day: 1.00  . Years: 10.00  . Pack years: 10.00  . Types: Cigarettes  . Quit date: 02/20/2019  . Years since quitting: 0.4   Smokeless Tobacco Never Used  Tobacco Comment    1/2 pack a day since age 18    Review of Systems  Constitutional: Negative for chills and fever.  HENT: Negative.   Eyes: Negative.   Respiratory: Negative.   Cardiovascular: Negative.   Gastrointestinal: Positive for abdominal pain.  Genitourinary: Negative.   Musculoskeletal: Positive for joint pain.  Skin: Negative.   Neurological: Negative.   Endo/Heme/Allergies: Negative.   Psychiatric/Behavioral: Negative.     Objective   Vitals:   08/16/19 0854  BP: (!) 156/76  Pulse: 84  Resp: 16  Temp: (!) 96.6 F (35.9 C)  SpO2: 95%    Physical Exam Vitals signs reviewed.  Constitutional:      Appearance: Normal appearance. She is not ill-appearing.  HENT:     Head: Normocephalic and atraumatic.  Cardiovascular:     Rate and Rhythm: Normal rate and regular rhythm.     Heart sounds: Normal heart sounds. No murmur. No friction rub. No gallop.   Pulmonary:     Effort: Pulmonary effort is normal. No respiratory distress.     Breath sounds: Normal breath sounds. No stridor. No wheezing, rhonchi or rales.  Abdominal:     General: Abdomen is flat. There is no distension.     Palpations: Abdomen is soft. There is no mass.     Tenderness: There is abdominal tenderness. There is no guarding or rebound.     Hernia: No hernia is present.     Comments: A 4 cm ovoid indurated area is noted with a 1 cm opening present.  This was probed with a Q-tip and peroxide and no further purulent drainage or white material was expressed.  There is erythema present.  Skin:    General: Skin is warm and dry.     Comments: See abdominal examination  Neurological:     Mental Status: She is alert and oriented to person, place, and time.     Comments: Ambulates with a cane     Assessment  Abdominal wall abscess, resolving Multiple myeloma, diabetes mellitus Plan   I do not think I need to open this wound further at this time.  The induration  may be due to cellulitis and hopefully this will resolve with time.  The patient was told to clean the wound daily with a Q-tip and peroxide.  I will see her again in 1 week for follow-up.  Should she worsen, she was instructed to call my office.

## 2019-08-23 ENCOUNTER — Inpatient Hospital Stay: Payer: Medicare Other

## 2019-08-23 ENCOUNTER — Ambulatory Visit (INDEPENDENT_AMBULATORY_CARE_PROVIDER_SITE_OTHER): Payer: Medicare Other | Admitting: General Surgery

## 2019-08-23 ENCOUNTER — Other Ambulatory Visit: Payer: Self-pay

## 2019-08-23 ENCOUNTER — Encounter: Payer: Self-pay | Admitting: General Surgery

## 2019-08-23 VITALS — BP 134/78 | HR 63 | Temp 96.8°F | Resp 18 | Ht 62.0 in | Wt 193.0 lb

## 2019-08-23 DIAGNOSIS — E1169 Type 2 diabetes mellitus with other specified complication: Secondary | ICD-10-CM | POA: Diagnosis not present

## 2019-08-23 DIAGNOSIS — L02211 Cutaneous abscess of abdominal wall: Secondary | ICD-10-CM

## 2019-08-23 DIAGNOSIS — E119 Type 2 diabetes mellitus without complications: Secondary | ICD-10-CM | POA: Diagnosis not present

## 2019-08-23 DIAGNOSIS — I10 Essential (primary) hypertension: Secondary | ICD-10-CM | POA: Diagnosis not present

## 2019-08-23 DIAGNOSIS — E782 Mixed hyperlipidemia: Secondary | ICD-10-CM | POA: Diagnosis not present

## 2019-08-23 DIAGNOSIS — E1165 Type 2 diabetes mellitus with hyperglycemia: Secondary | ICD-10-CM | POA: Diagnosis not present

## 2019-08-23 NOTE — Progress Notes (Signed)
Subjective:     Vicki Moon  Here for follow-up wound check.  The wound has closed over.  Less redness is noted.  The tissue along the inferior portion of the wound has softened.  Patient denies any fever or chills. Objective:    BP 134/78 (BP Location: Left Arm, Patient Position: Sitting, Cuff Size: Normal)   Pulse 63   Temp (!) 96.8 F (36 C) (Tympanic)   Resp 18   Ht 5\' 2"  (1.575 m)   Wt 193 lb (87.5 kg)   SpO2 97%   BMI 35.30 kg/m   General:  alert, cooperative and no distress  Abdomen is soft.  Induration and erythema in a linear fashion has decreased.  No fluctuance is present.  The open wound has healed over.     Assessment:    Resolving abscess of abdominal wall    Plan:   Patient is to see her oncologist next week.  I told her that I did not see any contraindication to resuming chemotherapy.  I will see her again next week to check the wound.  No need for antibiotics at this point.  Patient understands and agrees.

## 2019-08-30 ENCOUNTER — Other Ambulatory Visit: Payer: Self-pay

## 2019-08-30 ENCOUNTER — Inpatient Hospital Stay: Payer: Medicare Other | Attending: Hematology & Oncology

## 2019-08-30 ENCOUNTER — Inpatient Hospital Stay: Payer: Medicare Other

## 2019-08-30 ENCOUNTER — Inpatient Hospital Stay (HOSPITAL_BASED_OUTPATIENT_CLINIC_OR_DEPARTMENT_OTHER): Payer: Medicare Other | Admitting: Hematology & Oncology

## 2019-08-30 VITALS — BP 144/60 | HR 82 | Temp 96.9°F | Resp 18 | Wt 190.4 lb

## 2019-08-30 DIAGNOSIS — C9000 Multiple myeloma not having achieved remission: Secondary | ICD-10-CM | POA: Insufficient documentation

## 2019-08-30 DIAGNOSIS — Z79899 Other long term (current) drug therapy: Secondary | ICD-10-CM | POA: Diagnosis not present

## 2019-08-30 DIAGNOSIS — R5383 Other fatigue: Secondary | ICD-10-CM | POA: Diagnosis not present

## 2019-08-30 DIAGNOSIS — Z5111 Encounter for antineoplastic chemotherapy: Secondary | ICD-10-CM | POA: Diagnosis not present

## 2019-08-30 DIAGNOSIS — Z794 Long term (current) use of insulin: Secondary | ICD-10-CM | POA: Insufficient documentation

## 2019-08-30 DIAGNOSIS — Z8614 Personal history of Methicillin resistant Staphylococcus aureus infection: Secondary | ICD-10-CM | POA: Diagnosis not present

## 2019-08-30 LAB — CBC WITH DIFFERENTIAL (CANCER CENTER ONLY)
Abs Immature Granulocytes: 0.05 10*3/uL (ref 0.00–0.07)
Basophils Absolute: 0 10*3/uL (ref 0.0–0.1)
Basophils Relative: 0 %
Eosinophils Absolute: 0 10*3/uL (ref 0.0–0.5)
Eosinophils Relative: 1 %
HCT: 29.9 % — ABNORMAL LOW (ref 36.0–46.0)
Hemoglobin: 9.9 g/dL — ABNORMAL LOW (ref 12.0–15.0)
Immature Granulocytes: 2 %
Lymphocytes Relative: 19 %
Lymphs Abs: 0.5 10*3/uL — ABNORMAL LOW (ref 0.7–4.0)
MCH: 33.9 pg (ref 26.0–34.0)
MCHC: 33.1 g/dL (ref 30.0–36.0)
MCV: 102.4 fL — ABNORMAL HIGH (ref 80.0–100.0)
Monocytes Absolute: 0.2 10*3/uL (ref 0.1–1.0)
Monocytes Relative: 6 %
Neutro Abs: 1.9 10*3/uL (ref 1.7–7.7)
Neutrophils Relative %: 72 %
Platelet Count: 143 10*3/uL — ABNORMAL LOW (ref 150–400)
RBC: 2.92 MIL/uL — ABNORMAL LOW (ref 3.87–5.11)
RDW: 19.5 % — ABNORMAL HIGH (ref 11.5–15.5)
WBC Count: 2.6 10*3/uL — ABNORMAL LOW (ref 4.0–10.5)
nRBC: 0 % (ref 0.0–0.2)

## 2019-08-30 LAB — CMP (CANCER CENTER ONLY)
ALT: 10 U/L (ref 0–44)
AST: 11 U/L — ABNORMAL LOW (ref 15–41)
Albumin: 3.8 g/dL (ref 3.5–5.0)
Alkaline Phosphatase: 133 U/L — ABNORMAL HIGH (ref 38–126)
Anion gap: 8 (ref 5–15)
BUN: 12 mg/dL (ref 8–23)
CO2: 25 mmol/L (ref 22–32)
Calcium: 9.1 mg/dL (ref 8.9–10.3)
Chloride: 105 mmol/L (ref 98–111)
Creatinine: 0.86 mg/dL (ref 0.44–1.00)
GFR, Est AFR Am: >60 mL/min (ref >60)
GFR, Estimated: >60 mL/min (ref >60)
Glucose, Bld: 264 mg/dL — ABNORMAL HIGH (ref 70–99)
Potassium: 3.4 mmol/L — ABNORMAL LOW (ref 3.5–5.1)
Sodium: 138 mmol/L (ref 135–145)
Total Bilirubin: 0.4 mg/dL (ref 0.3–1.2)
Total Protein: 7.7 g/dL (ref 6.5–8.1)

## 2019-08-30 MED ORDER — BORTEZOMIB CHEMO SQ INJECTION 3.5 MG (2.5MG/ML)
1.3000 mg/m2 | Freq: Once | INTRAMUSCULAR | Status: AC
  Administered 2019-08-30: 2.5 mg via SUBCUTANEOUS
  Filled 2019-08-30: qty 1

## 2019-08-30 MED ORDER — PALONOSETRON HCL INJECTION 0.25 MG/5ML
0.2500 mg | Freq: Once | INTRAVENOUS | Status: AC
  Administered 2019-08-30: 0.25 mg via INTRAVENOUS

## 2019-08-30 MED ORDER — SODIUM CHLORIDE 0.9 % IV SOLN
Freq: Once | INTRAVENOUS | Status: AC
  Administered 2019-08-30: 11:00:00 via INTRAVENOUS
  Filled 2019-08-30: qty 250

## 2019-08-30 MED ORDER — SODIUM CHLORIDE 0.9 % IV SOLN
300.0000 mg/m2 | Freq: Once | INTRAVENOUS | Status: AC
  Administered 2019-08-30: 580 mg via INTRAVENOUS
  Filled 2019-08-30: qty 29

## 2019-08-30 MED ORDER — SODIUM CHLORIDE 0.9 % IV SOLN
20.0000 mg | Freq: Once | INTRAVENOUS | Status: AC
  Administered 2019-08-30: 20 mg via INTRAVENOUS
  Filled 2019-08-30: qty 20

## 2019-08-30 NOTE — Progress Notes (Signed)
Hematology and Oncology Follow Up Visit  Vicki Moon [id number] 11-29-1951 68 y.o. 08/30/2019   Principle Diagnosis:   IgG Kappa myeloma -- 1p-, 13q-, 16q- and t(11:14)  Current Therapy:    RVD -- start on 05/09/2019 -- d/c revlimid on 06/14/2019 -- d/c on 06/28/2019  CyBorD -- start cycle #1 on 07/12/2019 - s/p cycle #3  S/P Kyphoplasty at L2  XRT to lumbar spine -- to be done in [place]  Xgeva 120 mg sq q 3 months -- next dose on 10/2019     Interim History:  Vicki Moon is back for follow-up.  For right now, she is doing better.  She had the abscess on her anterior abdominal wall opened surgically.  This looks like it is healing nicely.  I am sure that it is still the MRSA that has been the problem.  We had to make delays in her treatment because of the infections.  Hopefully, we will not be able to get her "on track" to try to get her myeloma levels down.  Thankfully, her myeloma has not been all that bad.  Her last M spike was 1.3 g/dL.  She has had no cough.  She is actually tolerated treatment pretty well.  She has had problems with her blood sugars.  There is still quite high.  She has had no diarrhea..  There is been no leg swelling.  She has had no rashes.  Overall, her performance status is ECOG 1.     Medications:  Current Outpatient Medications:  .  ALPRAZolam (XANAX) 0.5 MG tablet, Take 0.5 mg by mouth daily., Disp: , Rfl:  .  atorvastatin (LIPITOR) 10 MG tablet, Take 10 mg by mouth daily., Disp: , Rfl: 5 .  B-D ULTRAFINE III SHORT PEN 31G X 8 MM MISC, USE ONE PEN NEEDLE 4 TIMES DAILY, Disp: 100 each, Rfl: 5 .  Continuous Blood Gluc Sensor ([place]) MISC, Use one sensor every 10 days., Disp: 3 each, Rfl: 2 .  doxycycline (VIBRAMYCIN) 100 MG capsule, TAKE 1 CAPSULE BY MOUTH TWICE DAILY FOR 7 DAYS, Disp: , Rfl:  .  glipiZIDE (GLUCOTROL XL) 10 MG 24 hr tablet, TAKE 1 TABLET BY MOUTH ONCE DAILY FOR SUGARS, Disp: , Rfl:  .  insulin lispro  (HUMALOG KWIKPEN) 100 UNIT/ML KiwkPen, Inject 0.22-0.28 mLs (22-28 Units total) into the skin 3 (three) times daily with meals., Disp: 25 mL, Rfl: 2 .  latanoprost (XALATAN) 0.005 % ophthalmic solution, Place 1 drop into both eyes at bedtime., Disp: , Rfl:  .  NOVOLOG 100 UNIT/ML injection, INJECT 35 UNITS SUBCUTANEOUSLY WITH MEALS, Disp: , Rfl:  .  PARoxetine (PAXIL) 30 MG tablet, Take 30 mg by mouth every morning., Disp: , Rfl: 3 .  potassium chloride SA (K-DUR) 20 MEQ tablet, Take 2 tablets (40 mEq total) by mouth daily., Disp: 60 tablet, Rfl: 1  Allergies:  Allergies  Allergen Reactions  . Metformin Nausea Only    "severe GI"    Past Medical History, Surgical history, Social history, and Family History were reviewed and updated.  Review of Systems: Review of Systems  Constitutional: Negative.   HENT:  Negative.   Eyes: Negative.   Respiratory: Negative.   Cardiovascular: Negative.   Gastrointestinal: Negative.   Endocrine: Negative.   Genitourinary: Negative.    Musculoskeletal: Positive for back pain.  Skin: Negative.   Neurological: Negative.   Hematological: Negative.   Psychiatric/Behavioral: Negative.     Physical Exam:  weight is  190 lb 6 oz (86.4 kg). Her temporal temperature is 96.9 F (36.1 C) (abnormal). Her blood pressure is 144/60 (abnormal) and her pulse is 82. Her respiration is 18 and oxygen saturation is 100%.   Wt Readings from Last 3 Encounters:  08/30/19 190 lb 6 oz (86.4 kg)  08/23/19 193 lb (87.5 kg)  08/16/19 196 lb (88.9 kg)    Physical Exam Vitals signs reviewed.  HENT:     Head: Normocephalic and atraumatic.  Eyes:     Pupils: Pupils are equal, round, and reactive to light.  Neck:     Musculoskeletal: Normal range of motion.  Cardiovascular:     Rate and Rhythm: Normal rate and regular rhythm.     Heart sounds: Normal heart sounds.  Pulmonary:     Effort: Pulmonary effort is normal.     Breath sounds: Normal breath sounds.   Abdominal:     General: Bowel sounds are normal.     Palpations: Abdomen is soft.  Musculoskeletal: Normal range of motion.        General: No tenderness or deformity.  Lymphadenopathy:     Cervical: No cervical adenopathy.  Skin:    General: Skin is warm and dry.     Findings: No erythema or rash.  Neurological:     Mental Status: She is alert and oriented to person, place, and time.  Psychiatric:        Behavior: Behavior normal.        Thought Content: Thought content normal.        Judgment: Judgment normal.      Lab Results  Component Value Date   WBC 2.6 (L) 08/30/2019   HGB 9.9 (L) 08/30/2019   HCT 29.9 (L) 08/30/2019   MCV 102.4 (H) 08/30/2019   PLT 143 (L) 08/30/2019     Chemistry      Component Value Date/Time   NA 138 08/30/2019 0933   K 3.4 (L) 08/30/2019 0933   CL 105 08/30/2019 0933   CO2 25 08/30/2019 0933   BUN 12 08/30/2019 0933   CREATININE 0.86 08/30/2019 0933   CREATININE 0.65 01/12/2018 1004      Component Value Date/Time   CALCIUM 9.1 08/30/2019 0933   ALKPHOS 133 (H) 08/30/2019 0933   AST 11 (L) 08/30/2019 0933   ALT 10 08/30/2019 0933   BILITOT 0.4 08/30/2019 0933       Impression and Plan: Vicki Moon is a 68 year old white female.  She has a new diagnosis of IgG kappa myeloma.  I think her cytogenetics place her in the high risk group.  We will go ahead with treatment today.  We will have her come back next week for her day 8 of cycle 4.  Hopefully, she will not have any problems with infections.  [name], MD 9/8/202010:08 AM

## 2019-08-30 NOTE — Progress Notes (Signed)
Reviewed labwork with Dr. [name] ok to treat today.

## 2019-08-30 NOTE — Patient Instructions (Signed)
[facility] Discharge Instructions for Patients Receiving Chemotherapy  Today you received the following chemotherapy agents Cytoxan, Velcade  To help prevent nausea and vomiting after your treatment, we encourage you to take your nausea medication    If you develop nausea and vomiting that is not controlled by your nausea medication, call the clinic.   BELOW ARE SYMPTOMS THAT SHOULD BE REPORTED IMMEDIATELY:  *FEVER GREATER THAN 100.5 F  *CHILLS WITH OR WITHOUT FEVER  NAUSEA AND VOMITING THAT IS NOT CONTROLLED WITH YOUR NAUSEA MEDICATION  *UNUSUAL SHORTNESS OF BREATH  *UNUSUAL BRUISING OR BLEEDING  TENDERNESS IN MOUTH AND THROAT WITH OR WITHOUT PRESENCE OF ULCERS  *URINARY PROBLEMS  *BOWEL PROBLEMS  UNUSUAL RASH Items with * indicate a potential emergency and should be followed up as soon as possible.  Feel free to call the clinic should you have any questions or concerns. The clinic phone number is [phone].  Please show the CHEMO ALERT CARD at check-in to the Emergency Department and triage nurse.   

## 2019-09-01 ENCOUNTER — Other Ambulatory Visit (HOSPITAL_COMMUNITY): Payer: Self-pay | Admitting: Family Medicine

## 2019-09-01 ENCOUNTER — Other Ambulatory Visit (HOSPITAL_COMMUNITY): Payer: Self-pay | Admitting: Physical Therapy

## 2019-09-01 ENCOUNTER — Ambulatory Visit (HOSPITAL_COMMUNITY)
Admission: RE | Admit: 2019-09-01 | Discharge: 2019-09-01 | Disposition: A | Discharge status: Home / Self Care | Payer: Medicare Other | Source: Ambulatory Visit | Attending: Family Medicine | Admitting: Family Medicine

## 2019-09-01 ENCOUNTER — Ambulatory Visit: Payer: Medicare Other | Admitting: General Surgery

## 2019-09-01 ENCOUNTER — Other Ambulatory Visit: Payer: Self-pay

## 2019-09-01 DIAGNOSIS — R0781 Pleurodynia: Secondary | ICD-10-CM

## 2019-09-01 DIAGNOSIS — R0782 Intercostal pain: Secondary | ICD-10-CM | POA: Diagnosis not present

## 2019-09-02 ENCOUNTER — Other Ambulatory Visit: Payer: Self-pay

## 2019-09-02 ENCOUNTER — Encounter (HOSPITAL_COMMUNITY): Payer: Self-pay | Admitting: Emergency Medicine

## 2019-09-02 ENCOUNTER — Emergency Department (HOSPITAL_COMMUNITY)
Admission: EM | Admit: 2019-09-02 | Discharge: 2019-09-03 | Disposition: A | Discharge status: Home / Self Care | Payer: Medicare Other | Attending: Emergency Medicine | Admitting: Emergency Medicine

## 2019-09-02 DIAGNOSIS — Z794 Long term (current) use of insulin: Secondary | ICD-10-CM | POA: Diagnosis not present

## 2019-09-02 DIAGNOSIS — E119 Type 2 diabetes mellitus without complications: Secondary | ICD-10-CM | POA: Insufficient documentation

## 2019-09-02 DIAGNOSIS — I10 Essential (primary) hypertension: Secondary | ICD-10-CM | POA: Insufficient documentation

## 2019-09-02 DIAGNOSIS — Z9221 Personal history of antineoplastic chemotherapy: Secondary | ICD-10-CM | POA: Insufficient documentation

## 2019-09-02 DIAGNOSIS — Z87891 Personal history of nicotine dependence: Secondary | ICD-10-CM | POA: Diagnosis not present

## 2019-09-02 DIAGNOSIS — R0781 Pleurodynia: Secondary | ICD-10-CM | POA: Diagnosis not present

## 2019-09-02 DIAGNOSIS — R079 Chest pain, unspecified: Secondary | ICD-10-CM | POA: Diagnosis present

## 2019-09-02 DIAGNOSIS — R0789 Other chest pain: Secondary | ICD-10-CM | POA: Diagnosis not present

## 2019-09-02 DIAGNOSIS — Z79899 Other long term (current) drug therapy: Secondary | ICD-10-CM | POA: Insufficient documentation

## 2019-09-02 DIAGNOSIS — Z85828 Personal history of other malignant neoplasm of skin: Secondary | ICD-10-CM | POA: Insufficient documentation

## 2019-09-02 LAB — CBC
HCT: 28.9 % — ABNORMAL LOW (ref 36.0–46.0)
Hemoglobin: 9.2 g/dL — ABNORMAL LOW (ref 12.0–15.0)
MCH: 33.9 pg (ref 26.0–34.0)
MCHC: 31.8 g/dL (ref 30.0–36.0)
MCV: 106.6 fL — ABNORMAL HIGH (ref 80.0–100.0)
Platelets: 131 10*3/uL — ABNORMAL LOW (ref 150–400)
RBC: 2.71 MIL/uL — ABNORMAL LOW (ref 3.87–5.11)
RDW: 19.8 % — ABNORMAL HIGH (ref 11.5–15.5)
WBC: 3.6 10*3/uL — ABNORMAL LOW (ref 4.0–10.5)
nRBC: 0.6 % — ABNORMAL HIGH (ref 0.0–0.2)

## 2019-09-02 LAB — COMPREHENSIVE METABOLIC PANEL
ALT: 18 U/L (ref 0–44)
AST: 13 U/L — ABNORMAL LOW (ref 15–41)
Albumin: 3.2 g/dL — ABNORMAL LOW (ref 3.5–5.0)
Alkaline Phosphatase: 122 U/L (ref 38–126)
Anion gap: 6 (ref 5–15)
BUN: 18 mg/dL (ref 8–23)
CO2: 25 mmol/L (ref 22–32)
Calcium: 8.9 mg/dL (ref 8.9–10.3)
Chloride: 106 mmol/L (ref 98–111)
Creatinine, Ser: 1.06 mg/dL — ABNORMAL HIGH (ref 0.44–1.00)
GFR calc Af Amer: >60 mL/min (ref >60)
GFR calc non Af Amer: 53 mL/min — ABNORMAL LOW (ref >60)
Glucose, Bld: 208 mg/dL — ABNORMAL HIGH (ref 70–99)
Potassium: 4.1 mmol/L (ref 3.5–5.1)
Sodium: 137 mmol/L (ref 135–145)
Total Bilirubin: 0.2 mg/dL — ABNORMAL LOW (ref 0.3–1.2)
Total Protein: 7.6 g/dL (ref 6.5–8.1)

## 2019-09-02 LAB — TROPONIN I (HIGH SENSITIVITY): Troponin I (High Sensitivity): 5 ng/L (ref <18)

## 2019-09-02 MED ORDER — SODIUM CHLORIDE 0.9% FLUSH: 3.0000 mL | Freq: Once | INTRAVENOUS | Status: DC

## 2019-09-02 MED ORDER — FENTANYL CITRATE (PF) 100 MCG/2ML IJ SOLN
50.0000 ug | Freq: Once | INTRAMUSCULAR | Status: AC
  Administered 2019-09-03: 50 ug via INTRAVENOUS
  Filled 2019-09-02: qty 2

## 2019-09-02 NOTE — ED Triage Notes (Signed)
Pt c/o epigastric pain since Tuesday and has seen pcp for the same. Pt last chemo treatment was Tuesday.

## 2019-09-02 NOTE — ED Provider Notes (Signed)
[facility] EMERGENCY DEPARTMENT Provider Note   CSN: [id number] Arrival date & time: 09/02/19  1950   Time seen 11:42 PM  History   Chief Complaint Chief Complaint  Patient presents with  . Chest Pain    HPI Vicki Moon is a 68 y.o. female.     HPI patient states she was diagnosed with multiple myeloma in March or April and is treated by Dr. [name].  She last got her chemotherapy on September 8.  She is getting CyborD.  She had a kyphoplasty done at L2 for a lytic lesion that was found.  She is to get radiation treatment in [place].  She reports that since the morning that she was supposed to get her chemotherapy she has been having left sided lower chest wall pain that hurts with palpation, movement, and deep breathing.  If she lies still the pain is gone.  She was sent for x-rays on September 10 which were read as normal.  She was seen on September 10 by her primary care doctor and got a "shot" in the office which may be Toradol, and a prescription for hydrocodone which she states does not help.  She states that the chest pain started radiating over to her right chest on September 10.  She states the right chest is not tender to palpation but "feels full".  She denies cough, fever or shortness of breath however her husband states she starts panting when she gets the pain.  She started noticing swelling of her feet last week.  She denies abdominal pain, nausea, vomiting, cough, fever, or ever having this pain before.  She denies any prior history of DVT.  She states she has been getting boils and she did have one lanced on her right abdomen recently.  She also states she got a abdominal injection recently and that skin is red.  There is no pain or fever.  She denies feeling a popping or cracking when she breathes.  PCP [name], MD Hematology Dr [name]  Past Medical History:  Diagnosis Date  . Diabetes mellitus without complication (HCC)    Type 2 IDDM x 5 years  . Glaucoma    . Goals of care, counseling/discussion 04/29/2019  . Herniated lumbar intervertebral disc 06/2014  . Multiple myeloma ([place]) 04/29/2019  . PONV (postoperative nausea and vomiting)     Patient Active Problem List   Diagnosis Date Noted  . Hypokalemia   . Furunculosis 05/25/2019  . 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 Diabetes mellitus without complication ([place]) 50/93/2671  . Multiple Skin abscessed due to MRSA 05/24/2019  . Pancytopenia ([place]) 05/24/2019  . Multiple myeloma ([place]) 04/29/2019  . Goals of care, counseling/discussion 04/29/2019  . Pain in abdominal muscle of right flank 03/08/2019  . Abscess of right axilla   . Cellulitis of axilla, right   . Cellulitis 12/17/2018  . AKI (acute kidney injury) ([place]) 12/17/2018  . Anemia 12/17/2018  . Type 2 diabetes mellitus ([place]) 12/17/2018  . Depression 12/17/2018  . Trochanteric bursitis, right hip 08/12/2018  . Post-menopausal 01/19/2018  . Current smoker 01/19/2018  . Mixed hyperlipidemia 07/13/2017  . Class 2 severe obesity due to excess calories with serious comorbidity and body mass index (BMI) of 37.0 to 37.9 in adult ([place]) 07/13/2017  . Hypercortisolemia ([place]) 01/20/2017  . Excessive weight gain 01/20/2017  . Generalized abdominal pain 01/20/2017  . Uncontrolled type 2 diabetes mellitus with complication, with long-term current use of insulin ([place]) 12/20/2015  .  Essential hypertension, benign 12/20/2015  . Vitamin D deficiency 12/20/2015  . Rectal bleeding 05/29/2015  . HNP (herniated nucleus pulposus), lumbar 07/12/2014    Class: Diagnosis of    Past Surgical History:  Procedure Laterality Date  . CHOLECYSTECTOMY    . COLONOSCOPY N/A 06/28/2015   Procedure: COLONOSCOPY;  Surgeon: Rogene Houston, MD;  Location[facility];  Service: Endoscopy;  Laterality: N/A;  155  . EYE SURGERY    . IR BONE TUMOR(S)RF ABLATION  04/28/2019  . IR KYPHO LUMBAR INC FX REDUCE BONE BX UNI/BIL CANNULATION INC/IMAGING  04/28/2019  . IR  RADIOLOGIST EVAL & MGMT  04/22/2019  . LUMBAR LAMINECTOMY Right 07/12/2014   Procedure: LUMBAR FIVE TO SACRAL ONE MICRODISCECTOMY;  Surgeon: Marybelle Killings, MD;  Location: [place];  Service: Orthopedics;  Laterality: Right;  . TUBAL LIGATION    . VITRECTOMY 25 GAUGE WITH SCLERAL BUCKLE Left 07/29/2017   Procedure: RETINAL DETACHMENT REPAIR LEFT EYE WITH PARSPLANA VITRECTOMY, AIR FLUID EXCHANGE, ENDO LASTER, DRAINAGE OF SUBRETINAL FLUID,ENDOLASER PPV /25 GAUGE;  Surgeon: Jalene Mullet, MD;  Location: [place];  Service: Ophthalmology;  Laterality: Left;     OB History   No obstetric history on file.      Home Medications    Prior to Admission medications   Medication Sig Start Date End Date Taking? Authorizing Provider  ALPRAZolam Duanne Moron) 0.5 MG tablet Take 0.5 mg by mouth daily. 05/10/19   [provider]  atorvastatin (LIPITOR) 10 MG tablet Take 10 mg by mouth daily. 08/17/18   [provider]  B-D ULTRAFINE III SHORT PEN 31G X 8 MM MISC USE ONE PEN NEEDLE 4 TIMES DAILY 11/19/17   Cassandria Anger, MD  Continuous Blood Gluc Sensor (FREESTYLE LIBRE SENSOR SYSTEM) MISC Use one sensor every 10 days. 10/14/17   Cassandria Anger, MD  cyclobenzaprine (FLEXERIL) 5 MG tablet Take 1 tablet (5 mg total) by mouth 3 (three) times daily as needed. 09/03/19   Rolland Porter, MD  doxycycline (VIBRAMYCIN) 100 MG capsule TAKE 1 CAPSULE BY MOUTH TWICE DAILY FOR 7 DAYS 08/05/19   [provider]  glipiZIDE (GLUCOTROL XL) 10 MG 24 hr tablet TAKE 1 TABLET BY MOUTH ONCE DAILY FOR SUGARS 04/04/19   [provider]  insulin lispro (HUMALOG KWIKPEN) 100 UNIT/ML KiwkPen Inject 0.22-0.28 mLs (22-28 Units total) into the skin 3 (three) times daily with meals. 01/19/18   Cassandria Anger, MD  latanoprost (XALATAN) 0.005 % ophthalmic solution Place 1 drop into both eyes at bedtime.    [provider]  NOVOLOG 100 UNIT/ML injection INJECT 35 UNITS SUBCUTANEOUSLY WITH MEALS  05/06/19   [provider]  PARoxetine (PAXIL) 30 MG tablet Take 30 mg by mouth every morning. 07/12/18   [provider]  potassium chloride SA (K-DUR) 20 MEQ tablet Take 2 tablets (40 mEq total) by mouth daily. 07/19/19   [name], MD  prochlorperazine (COMPAZINE) 10 MG tablet Take 1 tablet (10 mg total) by mouth every 6 (six) hours as needed (Nausea or vomiting). 05/17/19 06/29/19  [name], MD    Family History Family History  Problem Relation Age of Onset  . Hypertension Mother   . Stroke Mother   . Cancer Father        brain tumor  . Diabetes Father     Social History Social History   Tobacco Use  . Smoking status: Former Smoker    Packs/day: 1.00    Years: 10.00  Pack years: 10.00    Types: Cigarettes    Quit date: 02/20/2019    Years since quitting: 0.5  . Smokeless tobacco: Never Used  . Tobacco comment:  1/2 pack a day since age 21  Substance Use Topics  . Alcohol use: No    Alcohol/week: 0.0 standard drinks  . Drug use: No  Lives at home Lives with spouse   Allergies   Metformin   Review of Systems Review of Systems  All other systems reviewed and are negative.    Physical Exam Updated Vital Signs BP (!) 143/58   Pulse 66   Temp 98.4 F (36.9 C)   Resp 14   SpO2 96%   Physical Exam Vitals signs and nursing note reviewed.  Constitutional:      General: She is not in acute distress.    Appearance: Normal appearance. She is well-developed. She is not ill-appearing or toxic-appearing.  HENT:     Head: Normocephalic and atraumatic.     Right Ear: External ear normal.     Left Ear: External ear normal.     Nose: Nose normal. No mucosal edema or rhinorrhea.     Mouth/Throat:     Mouth: Mucous membranes are moist.     Dentition: No dental abscesses.     Pharynx: Oropharynx is clear. No uvula swelling.  Eyes:     Extraocular Movements: Extraocular movements intact.     Conjunctiva/sclera: Conjunctivae normal.      Pupils: Pupils are equal, round, and reactive to light.  Neck:     Musculoskeletal: Full passive range of motion without pain, normal range of motion and neck supple.  Cardiovascular:     Rate and Rhythm: Normal rate and regular rhythm.     Heart sounds: Normal heart sounds. No murmur. No friction rub. No gallop.   Pulmonary:     Effort: Pulmonary effort is normal. No respiratory distress.     Breath sounds: Normal breath sounds. No wheezing, rhonchi or rales.  Chest:     Chest wall: Tenderness present. No crepitus.       Comments: Patient is extremely tender in her left lateral lower chest wall. Abdominal:     General: Bowel sounds are normal. There is no distension.     Palpations: Abdomen is soft.     Tenderness: There is no abdominal tenderness. There is no guarding or rebound.  Musculoskeletal: Normal range of motion.        General: No tenderness.     Right lower leg: No edema.     Left lower leg: No edema.     Comments: Moves all extremities well.   Skin:    General: Skin is warm and dry.     Capillary Refill: Capillary refill takes less than 2 seconds.     Coloration: Skin is not pale.     Findings: No erythema or rash.     Comments: Patient has a small round Band-Aid in her right mid abdomen with some area of redness about 2 cm in size around it without pain to palpation.  She has an area superior to that with a linear healing incision where she states she had an abscess drained.  Neurological:     General: No focal deficit present.     Mental Status: She is alert and oriented to person, place, and time.     Cranial Nerves: No cranial nerve deficit.  Psychiatric:        Mood and Affect: Mood  normal. Mood is not anxious.        Speech: Speech normal.        Behavior: Behavior normal.        Thought Content: Thought content normal.        Judgment: Judgment normal.      ED Treatments / Results  Labs (all labs ordered are listed, but only abnormal results are  displayed) Results for orders placed or performed during the hospital encounter of 09/02/19  CBC  Result Value Ref Range   WBC 3.6 (L) 4.0 - 10.5 K/uL   RBC 2.71 (L) 3.87 - 5.11 MIL/uL   Hemoglobin 9.2 (L) 12.0 - 15.0 g/dL   HCT 28.9 (L) 36.0 - 46.0 %   MCV 106.6 (H) 80.0 - 100.0 fL   MCH 33.9 26.0 - 34.0 pg   MCHC 31.8 30.0 - 36.0 g/dL   RDW 19.8 (H) 11.5 - 15.5 %   Platelets 131 (L) 150 - 400 K/uL   nRBC 0.6 (H) 0.0 - 0.2 %  Comprehensive metabolic panel  Result Value Ref Range   Sodium 137 135 - 145 mmol/L   Potassium 4.1 3.5 - 5.1 mmol/L   Chloride 106 98 - 111 mmol/L   CO2 25 22 - 32 mmol/L   Glucose, Bld 208 (H) 70 - 99 mg/dL   BUN 18 8 - 23 mg/dL   Creatinine, Ser 1.06 (H) 0.44 - 1.00 mg/dL   Calcium 8.9 8.9 - 10.3 mg/dL   Total Protein 7.6 6.5 - 8.1 g/dL   Albumin 3.2 (L) 3.5 - 5.0 g/dL   AST 13 (L) 15 - 41 U/L   ALT 18 0 - 44 U/L   Alkaline Phosphatase 122 38 - 126 U/L   Total Bilirubin 0.2 (L) 0.3 - 1.2 mg/dL   GFR calc non Af Amer 53 (L) >60 mL/min   GFR calc Af Amer >60 >60 mL/min   Anion gap 6 5 - 15  Troponin I (High Sensitivity)  Result Value Ref Range   Troponin I (High Sensitivity) 5 <18 ng/L  Troponin I (High Sensitivity)  Result Value Ref Range   Troponin I (High Sensitivity) 6 <18 ng/L   Laboratory interpretation all normal except stable anemia    EKG #1 20:07 PM ED ECG REPORT   Date: 09/02/2019  Rate: 104  Rhythm: normal sinus rhythm  QRS Axis: indeterminate  Intervals: normal  ST/T Wave abnormalities: indeterminate  Conduction Disutrbances:none  Narrative Interpretation: POOR TRACING needs to be repeated  Old EKG Reviewed: none available  I have personally reviewed the EKG tracing and agree with the computerized printout as noted.    #2 EKG Interpretation  Date/Time:  Friday September 02 2019 23:16:21 EDT Ventricular Rate:  82 PR Interval:    QRS Duration: 99 QT Interval:  395 QTC Calculation: 462 R Axis:   7 Text  Interpretation:  Sinus rhythm Atrial premature complex Low voltage, precordial leads No significant change since last tracing 11 Jul 2014 Confirmed by Rolland Porter [id number]) on 09/02/2019 11:19:46 PM   Radiology Dg Ribs Bilateral W/chest  Result Date: 09/01/2019 CLINICAL DATA:  Intercostal and left rib pain EXAM: BILATERAL RIBS AND CHEST - 4+ VIEW COMPARISON:  Radiograph 03/28/2019, CT 04/25/2019 FINDINGS: No visible displaced rib fracture or other acute osseous abnormality. Soft tissue mineralization adjacent the right greater tuberosity in left lesser tuberosity compatible with hydroxyapatite deposition. There is no evidence of pneumothorax or pleural effusion. Both lungs are clear. Heart size and mediastinal contours are  within normal limits. IMPRESSION: 1. No visible displaced rib fracture or other acute osseous abnormality. 2. Features suggestive of calcific tendinosis in both shoulders. Electronically Signed   By: Lovena Le M.D.   On: 09/01/2019 21:04   Ct Angio Chest Pe W/cm &/or Wo Cm  Result Date: 09/03/2019 CLINICAL DATA:  "Recent hx of multiple myeloma, pain in left lower rib cage and now in right lower rib cage" EXAM: CT ANGIOGRAPHY CHEST WITH CONTRAST TECHNIQUE: Multidetector CT imaging of the chest was performed using the standard protocol during bolus administration of intravenous contrast. Multiplanar CT image reconstructions and MIPs were obtained to evaluate the vascular anatomy. CONTRAST:  40m OMNIPAQUE IOHEXOL 350 MG/ML SOLN COMPARISON:  Chest and rib radiographs 09/01/2019. PET CT 05/12/2019, chest CT 04/25/2019 FINDINGS: Cardiovascular: There are no filling defects within the pulmonary arteries to suggest pulmonary embolus. Atherosclerosis of the thoracic aorta without dissection. Left vertebral artery arises directly from the thoracic aorta, normal variant arch anatomy. Heart is normal in size. No pericardial effusion. Mediastinum/Nodes: No enlarged mediastinal or hilar lymph nodes.  Esophagus is decompressed. No visualized thyroid nodule. Lungs/Pleura: Subsegmental atelectasis in the lingula and dependent left lower lobe. Tiny centrilobular nodules on prior chest CT not as well demonstrated on the current exam. No pulmonary edema. No pleural effusion. Upper Abdomen: No acute findings mild adrenal thickening. Musculoskeletal: Remote fractures of right anterolateral sixth and seventh ribs. No acute rib fracture. Sclerosis involving anterior aspect of T2 vertebral body. Review of the MIP images confirms the above findings. IMPRESSION: 1. No pulmonary embolus or acute intrathoracic abnormality. 2. Remote right rib fractures, no acute rib fracture or focal rib lesion. 3. Sclerosis involving anterior aspect of T2 vertebral body may be related to multiple myeloma, and was not seen on prior chest or PET/CT. Aortic Atherosclerosis (ICD10-I70.0). Electronically Signed   By: MKeith RakeM.D.   On: 09/03/2019 01:39    Procedures Procedures (including critical care time)  Medications Ordered in ED Medications  fentaNYL (SUBLIMAZE) injection 50 mcg (50 mcg Intravenous Given 09/03/19 0009)  iohexol (OMNIPAQUE) 350 MG/ML injection 80 mL (80 mLs Intravenous Contrast Given 09/03/19 0111)     Initial Impression / Assessment and Plan / ED Course  I have reviewed the triage vital signs and the nursing notes.  Pertinent labs & imaging results that were available during my care of the patient were reviewed by me and considered in my medical decision making (see chart for details).    Patient was given IV fentanyl for pain, CT of her chest was done to look for PE, and to see if she has any osseous lesions of her rib cage.  Patient CT does not show any acute findings.  I am going to add a mild muscle relaxer to her hydrocodone that her primary care doctor has already written for her.  Final Clinical Impressions(s) / ED Diagnoses   Final diagnoses:  Chest wall pain    ED Discharge Orders          Ordered    cyclobenzaprine (FLEXERIL) 5 MG tablet  3 times daily PRN     09/03/19 0205         Plan discharge  IRolland Porter MD, FBarbette Or MD 09/03/19 [phone]

## 2019-09-03 ENCOUNTER — Emergency Department (HOSPITAL_COMMUNITY): Payer: Medicare Other

## 2019-09-03 ENCOUNTER — Encounter (HOSPITAL_COMMUNITY): Payer: Self-pay | Admitting: Radiology

## 2019-09-03 DIAGNOSIS — R0789 Other chest pain: Secondary | ICD-10-CM | POA: Diagnosis not present

## 2019-09-03 DIAGNOSIS — R0781 Pleurodynia: Secondary | ICD-10-CM | POA: Diagnosis not present

## 2019-09-03 LAB — TROPONIN I (HIGH SENSITIVITY): Troponin I (High Sensitivity): 6 ng/L (ref <18)

## 2019-09-03 MED ORDER — IOHEXOL 350 MG/ML SOLN
80.0000 mL | Freq: Once | INTRAVENOUS | Status: AC | PRN
  Administered 2019-09-03: 01:00:00 80 mL via INTRAVENOUS

## 2019-09-03 MED ORDER — CYCLOBENZAPRINE HCL 5 MG PO TABS: 5.0000 mg | ORAL_TABLET | Freq: Three times a day (TID) | ORAL | 0 refills | Status: DC | PRN

## 2019-09-03 NOTE — Discharge Instructions (Addendum)
Try ice and heat over the painful areas.  You can continue your hydrocodone for pain.  Add the mild muscle relaxer to see if that helps.  Recheck if you get fever, cough, or struggle to breathe.  Please let Dr. [name] know about your ED visit.

## 2019-09-13 ENCOUNTER — Inpatient Hospital Stay: Payer: Medicare Other

## 2019-09-13 ENCOUNTER — Telehealth: Payer: Self-pay | Admitting: Hematology & Oncology

## 2019-09-13 ENCOUNTER — Encounter: Payer: Self-pay | Admitting: Family

## 2019-09-13 ENCOUNTER — Inpatient Hospital Stay (HOSPITAL_BASED_OUTPATIENT_CLINIC_OR_DEPARTMENT_OTHER): Payer: Medicare Other | Admitting: Family

## 2019-09-13 ENCOUNTER — Other Ambulatory Visit: Payer: Self-pay

## 2019-09-13 VITALS — BP 134/78 | HR 77 | Temp 97.1°F | Resp 18 | Ht 62.0 in | Wt 191.8 lb

## 2019-09-13 DIAGNOSIS — R5383 Other fatigue: Secondary | ICD-10-CM | POA: Diagnosis not present

## 2019-09-13 DIAGNOSIS — C9002 Multiple myeloma in relapse: Secondary | ICD-10-CM

## 2019-09-13 DIAGNOSIS — Z794 Long term (current) use of insulin: Secondary | ICD-10-CM | POA: Diagnosis not present

## 2019-09-13 DIAGNOSIS — C9000 Multiple myeloma not having achieved remission: Secondary | ICD-10-CM

## 2019-09-13 DIAGNOSIS — Z79899 Other long term (current) drug therapy: Secondary | ICD-10-CM | POA: Diagnosis not present

## 2019-09-13 DIAGNOSIS — Z5111 Encounter for antineoplastic chemotherapy: Secondary | ICD-10-CM | POA: Diagnosis not present

## 2019-09-13 DIAGNOSIS — Z8614 Personal history of Methicillin resistant Staphylococcus aureus infection: Secondary | ICD-10-CM | POA: Diagnosis not present

## 2019-09-13 LAB — CMP (CANCER CENTER ONLY)
ALT: 10 U/L (ref 0–44)
AST: 10 U/L — ABNORMAL LOW (ref 15–41)
Albumin: 3.6 g/dL (ref 3.5–5.0)
Alkaline Phosphatase: 130 U/L — ABNORMAL HIGH (ref 38–126)
Anion gap: 8 (ref 5–15)
BUN: 14 mg/dL (ref 8–23)
CO2: 25 mmol/L (ref 22–32)
Calcium: 8.7 mg/dL — ABNORMAL LOW (ref 8.9–10.3)
Chloride: 106 mmol/L (ref 98–111)
Creatinine: 0.84 mg/dL (ref 0.44–1.00)
GFR, Est AFR Am: >60 mL/min (ref >60)
GFR, Estimated: >60 mL/min (ref >60)
Glucose, Bld: 228 mg/dL — ABNORMAL HIGH (ref 70–99)
Potassium: 3.7 mmol/L (ref 3.5–5.1)
Sodium: 139 mmol/L (ref 135–145)
Total Bilirubin: 0.4 mg/dL (ref 0.3–1.2)
Total Protein: 7.8 g/dL (ref 6.5–8.1)

## 2019-09-13 LAB — CBC WITH DIFFERENTIAL (CANCER CENTER ONLY)
Abs Immature Granulocytes: 0.02 10*3/uL (ref 0.00–0.07)
Basophils Absolute: 0 10*3/uL (ref 0.0–0.1)
Basophils Relative: 1 %
Eosinophils Absolute: 0 10*3/uL (ref 0.0–0.5)
Eosinophils Relative: 1 %
HCT: 29.4 % — ABNORMAL LOW (ref 36.0–46.0)
Hemoglobin: 9.6 g/dL — ABNORMAL LOW (ref 12.0–15.0)
Immature Granulocytes: 1 %
Lymphocytes Relative: 17 %
Lymphs Abs: 0.5 10*3/uL — ABNORMAL LOW (ref 0.7–4.0)
MCH: 34 pg (ref 26.0–34.0)
MCHC: 32.7 g/dL (ref 30.0–36.0)
MCV: 104.3 fL — ABNORMAL HIGH (ref 80.0–100.0)
Monocytes Absolute: 0.3 10*3/uL (ref 0.1–1.0)
Monocytes Relative: 10 %
Neutro Abs: 2 10*3/uL (ref 1.7–7.7)
Neutrophils Relative %: 70 %
Platelet Count: 133 10*3/uL — ABNORMAL LOW (ref 150–400)
RBC: 2.82 MIL/uL — ABNORMAL LOW (ref 3.87–5.11)
RDW: 18 % — ABNORMAL HIGH (ref 11.5–15.5)
WBC Count: 2.9 10*3/uL — ABNORMAL LOW (ref 4.0–10.5)
nRBC: 0 % (ref 0.0–0.2)

## 2019-09-13 MED ORDER — BORTEZOMIB CHEMO SQ INJECTION 3.5 MG (2.5MG/ML)
1.3000 mg/m2 | Freq: Once | INTRAMUSCULAR | Status: AC
  Administered 2019-09-13: 2.5 mg via SUBCUTANEOUS
  Filled 2019-09-13: qty 1

## 2019-09-13 MED ORDER — PALONOSETRON HCL INJECTION 0.25 MG/5ML
0.2500 mg | Freq: Once | INTRAVENOUS | Status: AC
  Administered 2019-09-13: 0.25 mg via INTRAVENOUS

## 2019-09-13 MED ORDER — SODIUM CHLORIDE 0.9 % IV SOLN
20.0000 mg | Freq: Once | INTRAVENOUS | Status: AC
  Administered 2019-09-13: 20 mg via INTRAVENOUS
  Filled 2019-09-13: qty 2

## 2019-09-13 MED ORDER — SODIUM CHLORIDE 0.9 % IV SOLN
Freq: Once | INTRAVENOUS | Status: AC
  Administered 2019-09-13: 10:00:00 via INTRAVENOUS
  Filled 2019-09-13: qty 250

## 2019-09-13 MED ORDER — DENOSUMAB 120 MG/1.7ML ~~LOC~~ SOLN
120.0000 mg | Freq: Once | SUBCUTANEOUS | Status: AC
  Administered 2019-09-13: 120 mg via SUBCUTANEOUS

## 2019-09-13 MED ORDER — SODIUM CHLORIDE 0.9 % IV SOLN
300.0000 mg/m2 | Freq: Once | INTRAVENOUS | Status: AC
  Administered 2019-09-13: 580 mg via INTRAVENOUS
  Filled 2019-09-13: qty 29

## 2019-09-13 MED ORDER — DENOSUMAB 120 MG/1.7ML ~~LOC~~ SOLN
SUBCUTANEOUS | Status: AC
  Filled 2019-09-13: qty 1.7

## 2019-09-13 MED ORDER — PALONOSETRON HCL INJECTION 0.25 MG/5ML
INTRAVENOUS | Status: AC
  Filled 2019-09-13: qty 5

## 2019-09-13 NOTE — Telephone Encounter (Signed)
Appointments scheduled calendar printed per 9/22 los 

## 2019-09-13 NOTE — Progress Notes (Signed)
Hematology and Oncology Follow Up Visit  Vicki Moon [id number] Jul 16, 1951 68 y.o. 09/13/2019   Principle Diagnosis:  IgG Kappa myeloma -- 1p-, 13q-, 16q- and t(11:14)  Past Therapy: RVD -- start on 05/09/2019 -- d/c revlimid on 06/14/2019 -- d/c on 06/28/2019 S/P Kyphoplasty at L2  Current Therapy:   CyBorD -- started on 07/12/2019 - s/p cycle 4 XRT to lumbar spine -- to be done in [place] Xgeva 120 mg sq q 3 months -- next dose on 10/2019   Interim History:  Vicki Moon is here today for follow-up and treatment. She is doing well and has no complaints at this time.  She is eating well and staying hydrated. Her weight is stable.  She has fatigue at times but is resting well at night.  In August, M-spike was 1.3, IgG level was 1,883 mg/dL and kappa light chains 23.95 mg/dL. The right abdominal wall where she had her previous incision for abscess has healed nicely. She states that it still itches at times but there is no redness or edema. Site is C/D/I.  No fever, chills, n/v, cough, rash, dizziness, SOB, chest pain, palpitations, abdominal pain or changes in bowel or bladder habits.  No episodes of bleeding, no bruising or petechiae.  No swelling, tenderness, numbness or tingling in her extremities.  She ambulates with a cane for added support. No falls or syncopal episodes to report.   ECOG Performance Status: 1 - Symptomatic but completely ambulatory  Medications:  Allergies as of 09/13/2019      Reactions   Metformin Nausea Only   "severe GI"      Medication List       Accurate as of September 13, 2019  8:47 AM. If you have any questions, ask your nurse or doctor.        STOP taking these medications   doxycycline 100 MG capsule Commonly known as: VIBRAMYCIN Stopped by: Laverna Peace, NP     TAKE these medications   ALPRAZolam 0.5 MG tablet Commonly known as: XANAX Take 0.5 mg by mouth daily.   atorvastatin 10 MG tablet Commonly known as: LIPITOR  Take 10 mg by mouth daily.   B-D ULTRAFINE III SHORT PEN 31G X 8 MM Misc Generic drug: Insulin Pen Needle USE ONE PEN NEEDLE 4 TIMES DAILY   cyclobenzaprine 5 MG tablet Commonly known as: FLEXERIL Take 1 tablet (5 mg total) by mouth 3 (three) times daily as needed.   FreeStyle [employer] Use one sensor every 10 days.   glipiZIDE 10 MG 24 hr tablet Commonly known as: GLUCOTROL XL TAKE 1 TABLET BY MOUTH ONCE DAILY FOR SUGARS   insulin lispro 100 UNIT/ML KiwkPen Commonly known as: HumaLOG KwikPen Inject 0.22-0.28 mLs (22-28 Units total) into the skin 3 (three) times daily with meals.   latanoprost 0.005 % ophthalmic solution Commonly known as: XALATAN Place 1 drop into both eyes at bedtime.   NovoLOG 100 UNIT/ML injection Generic drug: insulin aspart INJECT 35 UNITS SUBCUTANEOUSLY WITH MEALS   PARoxetine 30 MG tablet Commonly known as: PAXIL Take 30 mg by mouth every morning.   potassium chloride SA 20 MEQ tablet Commonly known as: K-DUR Take 2 tablets (40 mEq total) by mouth daily.       Allergies:  Allergies  Allergen Reactions  . Metformin Nausea Only    "severe GI"    Past Medical History, Surgical history, Social history, and Family History were reviewed and updated.  Review of Systems: All other  10 point review of systems is negative.   Physical Exam:  height is 5\' 2"  (1.575 m) and weight is 191 lb 12.8 oz (87 kg). Her temporal temperature is 97.1 F (36.2 C) (abnormal). Her blood pressure is 134/78 and her pulse is 77. Her respiration is 18 and oxygen saturation is 100%.   Wt Readings from Last 3 Encounters:  09/13/19 191 lb 12.8 oz (87 kg)  08/30/19 190 lb 6 oz (86.4 kg)  08/23/19 193 lb (87.5 kg)    Ocular: Sclerae unicteric, pupils equal, round and reactive to light Ear-nose-throat: Oropharynx clear, dentition fair Lymphatic: No cervical or supraclavicular adenopathy Lungs no rales or rhonchi, good excursion bilaterally Heart  regular rate and rhythm, no murmur appreciated Abd soft, nontender, positive bowel sounds, no liver or spleen tip palpated on exam, no fluid wave  MSK no focal spinal tenderness, no joint edema Neuro: non-focal, well-oriented, appropriate affect Breasts: Deferred   Lab Results  Component Value Date   WBC 2.9 (L) 09/13/2019   HGB 9.6 (L) 09/13/2019   HCT 29.4 (L) 09/13/2019   MCV 104.3 (H) 09/13/2019   PLT 133 (L) 09/13/2019   Lab Results  Component Value Date   FERRITIN 406 (H) 07/12/2019   IRON 125 07/12/2019   TIBC 283 07/12/2019   UIBC 157 07/12/2019   IRONPCTSAT 44 07/12/2019   Lab Results  Component Value Date   RBC 2.82 (L) 09/13/2019   Lab Results  Component Value Date   KPAFRELGTCHN 239.5 (H) 08/09/2019   LAMBDASER 6.9 08/09/2019   KAPLAMBRATIO 34.71 (H) 08/09/2019   Lab Results  Component Value Date   IGGSERUM 1,883 (H) 08/09/2019   IGGSERUM 1,884 (H) 08/09/2019   IGA 27 (L) 08/09/2019   IGA 28 (L) 08/09/2019   IGMSERUM 28 08/09/2019   IGMSERUM 29 08/09/2019   Lab Results  Component Value Date   TOTALPROTELP 6.9 08/09/2019   ALBUMINELP 3.0 08/09/2019   A1GS 0.3 08/09/2019   A2GS 1.2 (H) 08/09/2019   BETS 0.8 08/09/2019   GAMS 1.6 08/09/2019   MSPIKE 1.3 (H) 08/09/2019     Chemistry      Component Value Date/Time   NA 139 09/13/2019 0811   K 3.7 09/13/2019 0811   CL 106 09/13/2019 0811   CO2 25 09/13/2019 0811   BUN 14 09/13/2019 0811   CREATININE 0.84 09/13/2019 0811   CREATININE 0.65 01/12/2018 1004      Component Value Date/Time   CALCIUM 8.7 (L) 09/13/2019 0811   ALKPHOS 130 (H) 09/13/2019 0811   AST 10 (L) 09/13/2019 0811   ALT 10 09/13/2019 0811   BILITOT 0.4 09/13/2019 0811       Impression and Plan: Vicki Moon is a very pleasant 68 yo caucasian female with IgG kappa myeloma, genetics place her in high risk group.  She has no complaints at this time.  She is doing well on treatment and we will proceed with cycle 5 today as  planned.  We will see her back again next week.  She will contact our office with any questions or concerns. We can certainly see her sooner if needed.   Laverna Peace, NP 9/22/20208:47 AM

## 2019-09-13 NOTE — Patient Instructions (Signed)
Cyclophosphamide injection What is this medicine? CYCLOPHOSPHAMIDE (sye kloe FOSS fa mide) is a chemotherapy drug. It slows the growth of cancer cells. This medicine is used to treat many types of cancer like lymphoma, myeloma, leukemia, breast cancer, and ovarian cancer, to name a few. This medicine may be used for other purposes; ask your health care provider or pharmacist if you have questions. COMMON BRAND NAME(S): Cytoxan, Neosar What should I tell my health care provider before I take this medicine? They need to know if you have any of these conditions:  blood disorders  history of other chemotherapy  infection  kidney disease  liver disease  recent or ongoing radiation therapy  tumors in the bone marrow  an unusual or allergic reaction to cyclophosphamide, other chemotherapy, other medicines, foods, dyes, or preservatives  pregnant or trying to get pregnant  breast-feeding How should I use this medicine? This drug is usually given as an injection into a vein or muscle or by infusion into a vein. It is administered in a hospital or clinic by a specially trained health care professional. Talk to your pediatrician regarding the use of this medicine in children. Special care may be needed. Overdosage: If you think you have taken too much of this medicine contact a poison control center or emergency room at once. NOTE: This medicine is only for you. Do not share this medicine with others. What if I miss a dose? It is important not to miss your dose. Call your doctor or health care professional if you are unable to keep an appointment. What may interact with this medicine? This medicine may interact with the following medications:  amiodarone  amphotericin B  azathioprine  certain antiviral medicines for HIV or AIDS such as protease inhibitors (e.g., indinavir, ritonavir) and zidovudine  certain blood pressure medications such as benazepril, captopril, enalapril,  fosinopril, lisinopril, moexipril, monopril, perindopril, quinapril, ramipril, trandolapril  certain cancer medications such as anthracyclines (e.g., daunorubicin, doxorubicin), busulfan, cytarabine, paclitaxel, pentostatin, tamoxifen, trastuzumab  certain diuretics such as chlorothiazide, chlorthalidone, hydrochlorothiazide, indapamide, metolazone  certain medicines that treat or prevent blood clots like warfarin  certain muscle relaxants such as succinylcholine  cyclosporine  etanercept  indomethacin  medicines to increase blood counts like filgrastim, pegfilgrastim, sargramostim  medicines used as general anesthesia  metronidazole  natalizumab This list may not describe all possible interactions. Give your health care provider a list of all the medicines, herbs, non-prescription drugs, or dietary supplements you use. Also tell them if you smoke, drink alcohol, or use illegal drugs. Some items may interact with your medicine. What should I watch for while using this medicine? Visit your doctor for checks on your progress. This drug may make you feel generally unwell. This is not uncommon, as chemotherapy can affect healthy cells as well as cancer cells. Report any side effects. Continue your course of treatment even though you feel ill unless your doctor tells you to stop. Drink water or other fluids as directed. Urinate often, even at night. In some cases, you may be given additional medicines to help with side effects. Follow all directions for their use. Call your doctor or health care professional for advice if you get a fever, chills or sore throat, or other symptoms of a cold or flu. Do not treat yourself. This drug decreases your body's ability to fight infections. Try to avoid being around people who are sick. This medicine may increase your risk to bruise or bleed. Call your doctor or health care professional  if you notice any unusual bleeding. Be careful brushing and  flossing your teeth or using a toothpick because you may get an infection or bleed more easily. If you have any dental work done, tell your dentist you are receiving this medicine. You may get drowsy or dizzy. Do not drive, use machinery, or do anything that needs mental alertness until you know how this medicine affects you. Do not become pregnant while taking this medicine or for 1 year after stopping it. Women should inform their doctor if they wish to become pregnant or think they might be pregnant. Men should not father a child while taking this medicine and for 4 months after stopping it. There is a potential for serious side effects to an unborn child. Talk to your health care professional or pharmacist for more information. Do not breast-feed an infant while taking this medicine. This medicine may interfere with the ability to have a child. This medicine has caused ovarian failure in some women. This medicine has caused reduced sperm counts in some men. You should talk with your doctor or health care professional if you are concerned about your fertility. If you are going to have surgery, tell your doctor or health care professional that you have taken this medicine. What side effects may I notice from receiving this medicine? Side effects that you should report to your doctor or health care professional as soon as possible:  allergic reactions like skin rash, itching or hives, swelling of the face, lips, or tongue  low blood counts - this medicine may decrease the number of white blood cells, red blood cells and platelets. You may be at increased risk for infections and bleeding.  signs of infection - fever or chills, cough, sore throat, pain or difficulty passing urine  signs of decreased platelets or bleeding - bruising, pinpoint red spots on the skin, black, tarry stools, blood in the urine  signs of decreased red blood cells - unusually weak or tired, fainting spells,  lightheadedness  breathing problems  dark urine  dizziness  palpitations  swelling of the ankles, feet, hands  trouble passing urine or change in the amount of urine  weight gain  yellowing of the eyes or skin Side effects that usually do not require medical attention (report to your doctor or health care professional if they continue or are bothersome):  changes in nail or skin color  hair loss  missed menstrual periods  mouth sores  nausea, vomiting This list may not describe all possible side effects. Call your doctor for medical advice about side effects. You may report side effects to FDA at 1-800-FDA-1088. Where should I keep my medicine? This drug is given in a hospital or clinic and will not be stored at home. NOTE: This sheet is a summary. It may not cover all possible information. If you have questions about this medicine, talk to your doctor, pharmacist, or health care provider.  2020 Elsevier/Gold Standard (2012-10-22 16:22:58)  Bortezomib injection What is this medicine? BORTEZOMIB (bor TEZ oh mib) is a medicine that targets proteins in cancer cells and stops the cancer cells from growing. It is used to treat multiple myeloma and mantle-cell lymphoma. This medicine may be used for other purposes; ask your health care provider or pharmacist if you have questions. COMMON BRAND NAME(S): Velcade What should I tell my health care provider before I take this medicine? They need to know if you have any of these conditions:  diabetes  heart disease  irregular heartbeat  liver disease  on hemodialysis  low blood counts, like low white blood cells, platelets, or hemoglobin  peripheral neuropathy  taking medicine for blood pressure  an unusual or allergic reaction to bortezomib, mannitol, boron, other medicines, foods, dyes, or preservatives  pregnant or trying to get pregnant  breast-feeding How should I use this medicine? This medicine is for  injection into a vein or for injection under the skin. It is given by a health care professional in a hospital or clinic setting. Talk to your pediatrician regarding the use of this medicine in children. Special care may be needed. Overdosage: If you think you have taken too much of this medicine contact a poison control center or emergency room at once. NOTE: This medicine is only for you. Do not share this medicine with others. What if I miss a dose? It is important not to miss your dose. Call your doctor or health care professional if you are unable to keep an appointment. What may interact with this medicine? This medicine may interact with the following medications:  ketoconazole  rifampin  ritonavir  St. John's Wort This list may not describe all possible interactions. Give your health care provider a list of all the medicines, herbs, non-prescription drugs, or dietary supplements you use. Also tell them if you smoke, drink alcohol, or use illegal drugs. Some items may interact with your medicine. What should I watch for while using this medicine? You may get drowsy or dizzy. Do not drive, use machinery, or do anything that needs mental alertness until you know how this medicine affects you. Do not stand or sit up quickly, especially if you are an older patient. This reduces the risk of dizzy or fainting spells. In some cases, you may be given additional medicines to help with side effects. Follow all directions for their use. Call your doctor or health care professional for advice if you get a fever, chills or sore throat, or other symptoms of a cold or flu. Do not treat yourself. This drug decreases your body's ability to fight infections. Try to avoid being around people who are sick. This medicine may increase your risk to bruise or bleed. Call your doctor or health care professional if you notice any unusual bleeding. You may need blood work done while you are taking this  medicine. In some patients, this medicine may cause a serious brain infection that may cause death. If you have any problems seeing, thinking, speaking, walking, or standing, tell your doctor right away. If you cannot reach your doctor, urgently seek other source of medical care. Check with your doctor or health care professional if you get an attack of severe diarrhea, nausea and vomiting, or if you sweat a lot. The loss of too much body fluid can make it dangerous for you to take this medicine. Do not become pregnant while taking this medicine or for at least 7 months after stopping it. Women should inform their doctor if they wish to become pregnant or think they might be pregnant. Men should not father a child while taking this medicine and for at least 4 months after stopping it. There is a potential for serious side effects to an unborn child. Talk to your health care professional or pharmacist for more information. Do not breast-feed an infant while taking this medicine or for 2 months after stopping it. This medicine may interfere with the ability to have a child. You should talk with your doctor or health  care professional if you are concerned about your fertility. What side effects may I notice from receiving this medicine? Side effects that you should report to your doctor or health care professional as soon as possible:  allergic reactions like skin rash, itching or hives, swelling of the face, lips, or tongue  breathing problems  changes in hearing  changes in vision  fast, irregular heartbeat  feeling faint or lightheaded, falls  pain, tingling, numbness in the hands or feet  right upper belly pain  seizures  swelling of the ankles, feet, hands  unusual bleeding or bruising  unusually weak or tired  vomiting  yellowing of the eyes or skin Side effects that usually do not require medical attention (report to your doctor or health care professional if they continue or  are bothersome):  changes in emotions or moods  constipation  diarrhea  loss of appetite  headache  irritation at site where injected  nausea This list may not describe all possible side effects. Call your doctor for medical advice about side effects. You may report side effects to FDA at 1-800-FDA-1088. Where should I keep my medicine? This drug is given in a hospital or clinic and will not be stored at home. NOTE: This sheet is a summary. It may not cover all possible information. If you have questions about this medicine, talk to your doctor, pharmacist, or health care provider.  2020 Elsevier/Gold Standard (2018-04-19 16:29:31)  Denosumab injection What is this medicine? DENOSUMAB (den oh sue mab) slows bone breakdown. Prolia is used to treat osteoporosis in women after menopause and in men, and in people who are taking corticosteroids for 6 months or more. Delton See is used to treat a high calcium level due to cancer and to prevent bone fractures and other bone problems caused by multiple myeloma or cancer bone metastases. Delton See is also used to treat giant cell tumor of the bone. This medicine may be used for other purposes; ask your health care provider or pharmacist if you have questions. COMMON BRAND NAME(S): Prolia, XGEVA What should I tell my health care provider before I take this medicine? They need to know if you have any of these conditions:  dental disease  having surgery or tooth extraction  infection  kidney disease  low levels of calcium or Vitamin D in the blood  malnutrition  on hemodialysis  skin conditions or sensitivity  thyroid or parathyroid disease  an unusual reaction to denosumab, other medicines, foods, dyes, or preservatives  pregnant or trying to get pregnant  breast-feeding How should I use this medicine? This medicine is for injection under the skin. It is given by a health care professional in a hospital or clinic setting. A special  MedGuide will be given to you before each treatment. Be sure to read this information carefully each time. For Prolia, talk to your pediatrician regarding the use of this medicine in children. Special care may be needed. For Delton See, talk to your pediatrician regarding the use of this medicine in children. While this drug may be prescribed for children as young as 13 years for selected conditions, precautions do apply. Overdosage: If you think you have taken too much of this medicine contact a poison control center or emergency room at once. NOTE: This medicine is only for you. Do not share this medicine with others. What if I miss a dose? It is important not to miss your dose. Call your doctor or health care professional if you are unable to  keep an appointment. What may interact with this medicine? Do not take this medicine with any of the following medications:  other medicines containing denosumab This medicine may also interact with the following medications:  medicines that lower your chance of fighting infection  steroid medicines like prednisone or cortisone This list may not describe all possible interactions. Give your health care provider a list of all the medicines, herbs, non-prescription drugs, or dietary supplements you use. Also tell them if you smoke, drink alcohol, or use illegal drugs. Some items may interact with your medicine. What should I watch for while using this medicine? Visit your doctor or health care professional for regular checks on your progress. Your doctor or health care professional may order blood tests and other tests to see how you are doing. Call your doctor or health care professional for advice if you get a fever, chills or sore throat, or other symptoms of a cold or flu. Do not treat yourself. This drug may decrease your body's ability to fight infection. Try to avoid being around people who are sick. You should make sure you get enough calcium and vitamin  D while you are taking this medicine, unless your doctor tells you not to. Discuss the foods you eat and the vitamins you take with your health care professional. See your dentist regularly. Brush and floss your teeth as directed. Before you have any dental work done, tell your dentist you are receiving this medicine. Do not become pregnant while taking this medicine or for 5 months after stopping it. Talk with your doctor or health care professional about your birth control options while taking this medicine. Women should inform their doctor if they wish to become pregnant or think they might be pregnant. There is a potential for serious side effects to an unborn child. Talk to your health care professional or pharmacist for more information. What side effects may I notice from receiving this medicine? Side effects that you should report to your doctor or health care professional as soon as possible:  allergic reactions like skin rash, itching or hives, swelling of the face, lips, or tongue  bone pain  breathing problems  dizziness  jaw pain, especially after dental work  redness, blistering, peeling of the skin  signs and symptoms of infection like fever or chills; cough; sore throat; pain or trouble passing urine  signs of low calcium like fast heartbeat, muscle cramps or muscle pain; pain, tingling, numbness in the hands or feet; seizures  unusual bleeding or bruising  unusually weak or tired Side effects that usually do not require medical attention (report to your doctor or health care professional if they continue or are bothersome):  constipation  diarrhea  headache  joint pain  loss of appetite  muscle pain  runny nose  tiredness  upset stomach This list may not describe all possible side effects. Call your doctor for medical advice about side effects. You may report side effects to FDA at 1-800-FDA-1088. Where should I keep my medicine? This medicine is only  given in a clinic, doctor's office, or other health care setting and will not be stored at home. NOTE: This sheet is a summary. It may not cover all possible information. If you have questions about this medicine, talk to your doctor, pharmacist, or health care provider.  2020 Elsevier/Gold Standard (2018-04-16 16:10:44)

## 2019-09-20 ENCOUNTER — Other Ambulatory Visit: Payer: Self-pay | Admitting: Hematology & Oncology

## 2019-09-20 ENCOUNTER — Inpatient Hospital Stay: Payer: Medicare Other

## 2019-09-20 ENCOUNTER — Other Ambulatory Visit: Payer: Self-pay

## 2019-09-20 VITALS — BP 157/84 | HR 75 | Temp 97.5°F | Resp 18

## 2019-09-20 DIAGNOSIS — C9000 Multiple myeloma not having achieved remission: Secondary | ICD-10-CM | POA: Diagnosis not present

## 2019-09-20 DIAGNOSIS — Z5111 Encounter for antineoplastic chemotherapy: Secondary | ICD-10-CM | POA: Diagnosis not present

## 2019-09-20 DIAGNOSIS — Z79899 Other long term (current) drug therapy: Secondary | ICD-10-CM | POA: Diagnosis not present

## 2019-09-20 DIAGNOSIS — Z794 Long term (current) use of insulin: Secondary | ICD-10-CM | POA: Diagnosis not present

## 2019-09-20 DIAGNOSIS — R5383 Other fatigue: Secondary | ICD-10-CM | POA: Diagnosis not present

## 2019-09-20 DIAGNOSIS — Z8614 Personal history of Methicillin resistant Staphylococcus aureus infection: Secondary | ICD-10-CM | POA: Diagnosis not present

## 2019-09-20 LAB — CMP (CANCER CENTER ONLY)
ALT: 10 U/L (ref 0–44)
AST: 9 U/L — ABNORMAL LOW (ref 15–41)
Albumin: 3.7 g/dL (ref 3.5–5.0)
Alkaline Phosphatase: 129 U/L — ABNORMAL HIGH (ref 38–126)
Anion gap: 7 (ref 5–15)
BUN: 13 mg/dL (ref 8–23)
CO2: 25 mmol/L (ref 22–32)
Calcium: 8.8 mg/dL — ABNORMAL LOW (ref 8.9–10.3)
Chloride: 108 mmol/L (ref 98–111)
Creatinine: 0.76 mg/dL (ref 0.44–1.00)
GFR, Est AFR Am: >60 mL/min (ref >60)
GFR, Estimated: >60 mL/min (ref >60)
Glucose, Bld: 223 mg/dL — ABNORMAL HIGH (ref 70–99)
Potassium: 3.4 mmol/L — ABNORMAL LOW (ref 3.5–5.1)
Sodium: 140 mmol/L (ref 135–145)
Total Bilirubin: 0.3 mg/dL (ref 0.3–1.2)
Total Protein: 7.5 g/dL (ref 6.5–8.1)

## 2019-09-20 LAB — CBC WITH DIFFERENTIAL (CANCER CENTER ONLY)
Abs Immature Granulocytes: 0.03 10*3/uL (ref 0.00–0.07)
Basophils Absolute: 0 10*3/uL (ref 0.0–0.1)
Basophils Relative: 0 %
Eosinophils Absolute: 0 10*3/uL (ref 0.0–0.5)
Eosinophils Relative: 2 %
HCT: 27.1 % — ABNORMAL LOW (ref 36.0–46.0)
Hemoglobin: 8.9 g/dL — ABNORMAL LOW (ref 12.0–15.0)
Immature Granulocytes: 1 %
Lymphocytes Relative: 22 %
Lymphs Abs: 0.5 10*3/uL — ABNORMAL LOW (ref 0.7–4.0)
MCH: 34.1 pg — ABNORMAL HIGH (ref 26.0–34.0)
MCHC: 32.8 g/dL (ref 30.0–36.0)
MCV: 103.8 fL — ABNORMAL HIGH (ref 80.0–100.0)
Monocytes Absolute: 0.2 10*3/uL (ref 0.1–1.0)
Monocytes Relative: 9 %
Neutro Abs: 1.5 10*3/uL — ABNORMAL LOW (ref 1.7–7.7)
Neutrophils Relative %: 66 %
Platelet Count: 115 10*3/uL — ABNORMAL LOW (ref 150–400)
RBC: 2.61 MIL/uL — ABNORMAL LOW (ref 3.87–5.11)
RDW: 17.5 % — ABNORMAL HIGH (ref 11.5–15.5)
WBC Count: 2.2 10*3/uL — ABNORMAL LOW (ref 4.0–10.5)
nRBC: 0 % (ref 0.0–0.2)

## 2019-09-20 MED ORDER — SODIUM CHLORIDE 0.9 % IV SOLN
Freq: Once | INTRAVENOUS | Status: AC
  Administered 2019-09-20: 11:00:00 via INTRAVENOUS
  Filled 2019-09-20: qty 250

## 2019-09-20 MED ORDER — PALONOSETRON HCL INJECTION 0.25 MG/5ML
INTRAVENOUS | Status: AC
  Filled 2019-09-20: qty 5

## 2019-09-20 MED ORDER — PALONOSETRON HCL INJECTION 0.25 MG/5ML
0.2500 mg | Freq: Once | INTRAVENOUS | Status: AC
  Administered 2019-09-20: 11:00:00 0.25 mg via INTRAVENOUS

## 2019-09-20 MED ORDER — BORTEZOMIB CHEMO SQ INJECTION 3.5 MG (2.5MG/ML)
1.3000 mg/m2 | Freq: Once | INTRAMUSCULAR | Status: AC
  Administered 2019-09-20: 2.5 mg via SUBCUTANEOUS
  Filled 2019-09-20: qty 1

## 2019-09-20 MED ORDER — SODIUM CHLORIDE 0.9 % IV SOLN
300.0000 mg/m2 | Freq: Once | INTRAVENOUS | Status: AC
  Administered 2019-09-20: 580 mg via INTRAVENOUS
  Filled 2019-09-20: qty 29

## 2019-09-20 MED ORDER — SODIUM CHLORIDE 0.9 % IV SOLN
20.0000 mg | Freq: Once | INTRAVENOUS | Status: AC
  Administered 2019-09-20: 11:00:00 20 mg via INTRAVENOUS
  Filled 2019-09-20: qty 20

## 2019-09-20 NOTE — Patient Instructions (Signed)
[facility] Discharge Instructions for Patients Receiving Chemotherapy  Today you received the following chemotherapy agents Cytoxan, Velcade  To help prevent nausea and vomiting after your treatment, we encourage you to take your nausea medication    If you develop nausea and vomiting that is not controlled by your nausea medication, call the clinic.   BELOW ARE SYMPTOMS THAT SHOULD BE REPORTED IMMEDIATELY:  *FEVER GREATER THAN 100.5 F  *CHILLS WITH OR WITHOUT FEVER  NAUSEA AND VOMITING THAT IS NOT CONTROLLED WITH YOUR NAUSEA MEDICATION  *UNUSUAL SHORTNESS OF BREATH  *UNUSUAL BRUISING OR BLEEDING  TENDERNESS IN MOUTH AND THROAT WITH OR WITHOUT PRESENCE OF ULCERS  *URINARY PROBLEMS  *BOWEL PROBLEMS  UNUSUAL RASH Items with * indicate a potential emergency and should be followed up as soon as possible.  Feel free to call the clinic should you have any questions or concerns. The clinic phone number is [phone].  Please show the CHEMO ALERT CARD at check-in to the Emergency Department and triage nurse.   

## 2019-09-21 LAB — KAPPA/LAMBDA LIGHT CHAINS
Kappa free light chain: 243.3 mg/L — ABNORMAL HIGH (ref 3.3–19.4)
Kappa, lambda light chain ratio: 42.68 — ABNORMAL HIGH (ref 0.26–1.65)
Lambda free light chains: 5.7 mg/L (ref 5.7–26.3)

## 2019-09-21 LAB — PROTEIN ELECTROPHORESIS, SERUM
A/G Ratio: 0.9 (ref 0.7–1.7)
Albumin ELP: 3.4 g/dL (ref 2.9–4.4)
Alpha-1-Globulin: 0.2 g/dL (ref 0.0–0.4)
Alpha-2-Globulin: 1 g/dL (ref 0.4–1.0)
Beta Globulin: 0.8 g/dL (ref 0.7–1.3)
Gamma Globulin: 1.7 g/dL (ref 0.4–1.8)
Globulin, Total: 3.7 g/dL (ref 2.2–3.9)
M-Spike, %: 1.5 g/dL — ABNORMAL HIGH
Total Protein ELP: 7.1 g/dL (ref 6.0–8.5)

## 2019-09-21 LAB — IGG, IGA, IGM
IgA: 24 mg/dL — ABNORMAL LOW (ref 87–352)
IgG (Immunoglobin G), Serum: 2192 mg/dL — ABNORMAL HIGH (ref 586–1602)
IgM (Immunoglobulin M), Srm: 26 mg/dL (ref 26–217)

## 2019-09-27 ENCOUNTER — Ambulatory Visit: Payer: Medicare Other

## 2019-09-27 ENCOUNTER — Ambulatory Visit: Payer: Medicare Other | Admitting: Family

## 2019-09-27 ENCOUNTER — Other Ambulatory Visit: Payer: Medicare Other

## 2019-09-28 DIAGNOSIS — E119 Type 2 diabetes mellitus without complications: Secondary | ICD-10-CM | POA: Diagnosis not present

## 2019-09-28 DIAGNOSIS — E782 Mixed hyperlipidemia: Secondary | ICD-10-CM | POA: Diagnosis not present

## 2019-09-28 DIAGNOSIS — E1165 Type 2 diabetes mellitus with hyperglycemia: Secondary | ICD-10-CM | POA: Diagnosis not present

## 2019-09-28 DIAGNOSIS — E1169 Type 2 diabetes mellitus with other specified complication: Secondary | ICD-10-CM | POA: Diagnosis not present

## 2019-09-28 DIAGNOSIS — I10 Essential (primary) hypertension: Secondary | ICD-10-CM | POA: Diagnosis not present

## 2019-10-03 ENCOUNTER — Other Ambulatory Visit: Payer: Self-pay | Admitting: *Deleted

## 2019-10-03 DIAGNOSIS — N189 Chronic kidney disease, unspecified: Secondary | ICD-10-CM

## 2019-10-03 DIAGNOSIS — E876 Hypokalemia: Secondary | ICD-10-CM

## 2019-10-03 DIAGNOSIS — C9000 Multiple myeloma not having achieved remission: Secondary | ICD-10-CM

## 2019-10-03 DIAGNOSIS — D631 Anemia in chronic kidney disease: Secondary | ICD-10-CM

## 2019-10-04 ENCOUNTER — Other Ambulatory Visit: Payer: Self-pay

## 2019-10-04 ENCOUNTER — Inpatient Hospital Stay: Payer: Medicare Other

## 2019-10-04 ENCOUNTER — Inpatient Hospital Stay: Payer: Medicare Other | Attending: Hematology & Oncology

## 2019-10-04 VITALS — BP 158/62 | HR 85 | Temp 97.6°F | Resp 16

## 2019-10-04 DIAGNOSIS — Z5112 Encounter for antineoplastic immunotherapy: Secondary | ICD-10-CM | POA: Diagnosis not present

## 2019-10-04 DIAGNOSIS — C9000 Multiple myeloma not having achieved remission: Secondary | ICD-10-CM

## 2019-10-04 DIAGNOSIS — Z79899 Other long term (current) drug therapy: Secondary | ICD-10-CM | POA: Insufficient documentation

## 2019-10-04 DIAGNOSIS — Z794 Long term (current) use of insulin: Secondary | ICD-10-CM | POA: Insufficient documentation

## 2019-10-04 DIAGNOSIS — E876 Hypokalemia: Secondary | ICD-10-CM

## 2019-10-04 DIAGNOSIS — Z5111 Encounter for antineoplastic chemotherapy: Secondary | ICD-10-CM | POA: Diagnosis not present

## 2019-10-04 DIAGNOSIS — D631 Anemia in chronic kidney disease: Secondary | ICD-10-CM

## 2019-10-04 DIAGNOSIS — N189 Chronic kidney disease, unspecified: Secondary | ICD-10-CM

## 2019-10-04 LAB — CBC WITH DIFFERENTIAL (CANCER CENTER ONLY)
Abs Immature Granulocytes: 0.02 10*3/uL (ref 0.00–0.07)
Basophils Absolute: 0 10*3/uL (ref 0.0–0.1)
Basophils Relative: 0 %
Eosinophils Absolute: 0 10*3/uL (ref 0.0–0.5)
Eosinophils Relative: 1 %
HCT: 30.2 % — ABNORMAL LOW (ref 36.0–46.0)
Hemoglobin: 9.8 g/dL — ABNORMAL LOW (ref 12.0–15.0)
Immature Granulocytes: 1 %
Lymphocytes Relative: 17 %
Lymphs Abs: 0.4 10*3/uL — ABNORMAL LOW (ref 0.7–4.0)
MCH: 34.4 pg — ABNORMAL HIGH (ref 26.0–34.0)
MCHC: 32.5 g/dL (ref 30.0–36.0)
MCV: 106 fL — ABNORMAL HIGH (ref 80.0–100.0)
Monocytes Absolute: 0.3 10*3/uL (ref 0.1–1.0)
Monocytes Relative: 11 %
Neutro Abs: 1.7 10*3/uL (ref 1.7–7.7)
Neutrophils Relative %: 70 %
Platelet Count: 137 10*3/uL — ABNORMAL LOW (ref 150–400)
RBC: 2.85 MIL/uL — ABNORMAL LOW (ref 3.87–5.11)
RDW: 16.5 % — ABNORMAL HIGH (ref 11.5–15.5)
WBC Count: 2.5 10*3/uL — ABNORMAL LOW (ref 4.0–10.5)
nRBC: 0 % (ref 0.0–0.2)

## 2019-10-04 LAB — CMP (CANCER CENTER ONLY)
ALT: 10 U/L (ref 0–44)
AST: 10 U/L — ABNORMAL LOW (ref 15–41)
Albumin: 3.7 g/dL (ref 3.5–5.0)
Alkaline Phosphatase: 120 U/L (ref 38–126)
Anion gap: 7 (ref 5–15)
BUN: 9 mg/dL (ref 8–23)
CO2: 27 mmol/L (ref 22–32)
Calcium: 8.7 mg/dL — ABNORMAL LOW (ref 8.9–10.3)
Chloride: 106 mmol/L (ref 98–111)
Creatinine: 0.86 mg/dL (ref 0.44–1.00)
GFR, Est AFR Am: >60 mL/min (ref >60)
GFR, Estimated: >60 mL/min (ref >60)
Glucose, Bld: 230 mg/dL — ABNORMAL HIGH (ref 70–99)
Potassium: 3.5 mmol/L (ref 3.5–5.1)
Sodium: 140 mmol/L (ref 135–145)
Total Bilirubin: 0.4 mg/dL (ref 0.3–1.2)
Total Protein: 7.5 g/dL (ref 6.5–8.1)

## 2019-10-04 MED ORDER — SODIUM CHLORIDE 0.9 % IV SOLN
20.0000 mg | Freq: Once | INTRAVENOUS | Status: AC
  Administered 2019-10-04: 20 mg via INTRAVENOUS
  Filled 2019-10-04: qty 20

## 2019-10-04 MED ORDER — BORTEZOMIB CHEMO SQ INJECTION 3.5 MG (2.5MG/ML)
1.3000 mg/m2 | Freq: Once | INTRAMUSCULAR | Status: AC
  Administered 2019-10-04: 2.5 mg via SUBCUTANEOUS
  Filled 2019-10-04: qty 1

## 2019-10-04 MED ORDER — PALONOSETRON HCL INJECTION 0.25 MG/5ML
0.2500 mg | Freq: Once | INTRAVENOUS | Status: AC
  Administered 2019-10-04: 0.25 mg via INTRAVENOUS

## 2019-10-04 MED ORDER — PALONOSETRON HCL INJECTION 0.25 MG/5ML
INTRAVENOUS | Status: AC
  Filled 2019-10-04: qty 5

## 2019-10-04 MED ORDER — SODIUM CHLORIDE 0.9 % IV SOLN
300.0000 mg/m2 | Freq: Once | INTRAVENOUS | Status: AC
  Administered 2019-10-04: 580 mg via INTRAVENOUS
  Filled 2019-10-04: qty 29

## 2019-10-04 MED ORDER — SODIUM CHLORIDE 0.9 % IV SOLN
Freq: Once | INTRAVENOUS | Status: AC
  Administered 2019-10-04: 13:00:00 via INTRAVENOUS
  Filled 2019-10-04: qty 250

## 2019-10-04 NOTE — Patient Instructions (Signed)
Bortezomib injection What is this medicine? BORTEZOMIB (bor TEZ oh mib) is a medicine that targets proteins in cancer cells and stops the cancer cells from growing. It is used to treat multiple myeloma and mantle-cell lymphoma. This medicine may be used for other purposes; ask your health care provider or pharmacist if you have questions. COMMON BRAND NAME(S): Velcade What should I tell my health care provider before I take this medicine? They need to know if you have any of these conditions:  diabetes  heart disease  irregular heartbeat  liver disease  on hemodialysis  low blood counts, like low white blood cells, platelets, or hemoglobin  peripheral neuropathy  taking medicine for blood pressure  an unusual or allergic reaction to bortezomib, mannitol, boron, other medicines, foods, dyes, or preservatives  pregnant or trying to get pregnant  breast-feeding How should I use this medicine? This medicine is for injection into a vein or for injection under the skin. It is given by a health care professional in a hospital or clinic setting. Talk to your pediatrician regarding the use of this medicine in children. Special care may be needed. Overdosage: If you think you have taken too much of this medicine contact a poison control center or emergency room at once. NOTE: This medicine is only for you. Do not share this medicine with others. What if I miss a dose? It is important not to miss your dose. Call your doctor or health care professional if you are unable to keep an appointment. What may interact with this medicine? This medicine may interact with the following medications:  ketoconazole  rifampin  ritonavir  St. John's Wort This list may not describe all possible interactions. Give your health care provider a list of all the medicines, herbs, non-prescription drugs, or dietary supplements you use. Also tell them if you smoke, drink alcohol, or use illegal drugs. Some  items may interact with your medicine. What should I watch for while using this medicine? You may get drowsy or dizzy. Do not drive, use machinery, or do anything that needs mental alertness until you know how this medicine affects you. Do not stand or sit up quickly, especially if you are an older patient. This reduces the risk of dizzy or fainting spells. In some cases, you may be given additional medicines to help with side effects. Follow all directions for their use. Call your doctor or health care professional for advice if you get a fever, chills or sore throat, or other symptoms of a cold or flu. Do not treat yourself. This drug decreases your body's ability to fight infections. Try to avoid being around people who are sick. This medicine may increase your risk to bruise or bleed. Call your doctor or health care professional if you notice any unusual bleeding. You may need blood work done while you are taking this medicine. In some patients, this medicine may cause a serious brain infection that may cause death. If you have any problems seeing, thinking, speaking, walking, or standing, tell your doctor right away. If you cannot reach your doctor, urgently seek other source of medical care. Check with your doctor or health care professional if you get an attack of severe diarrhea, nausea and vomiting, or if you sweat a lot. The loss of too much body fluid can make it dangerous for you to take this medicine. Do not become pregnant while taking this medicine or for at least 7 months after stopping it. Women should inform their doctor   if they wish to become pregnant or think they might be pregnant. Men should not father a child while taking this medicine and for at least 4 months after stopping it. There is a potential for serious side effects to an unborn child. Talk to your health care professional or pharmacist for more information. Do not breast-feed an infant while taking this medicine or for 2  months after stopping it. This medicine may interfere with the ability to have a child. You should talk with your doctor or health care professional if you are concerned about your fertility. What side effects may I notice from receiving this medicine? Side effects that you should report to your doctor or health care professional as soon as possible:  allergic reactions like skin rash, itching or hives, swelling of the face, lips, or tongue  breathing problems  changes in hearing  changes in vision  fast, irregular heartbeat  feeling faint or lightheaded, falls  pain, tingling, numbness in the hands or feet  right upper belly pain  seizures  swelling of the ankles, feet, hands  unusual bleeding or bruising  unusually weak or tired  vomiting  yellowing of the eyes or skin Side effects that usually do not require medical attention (report to your doctor or health care professional if they continue or are bothersome):  changes in emotions or moods  constipation  diarrhea  loss of appetite  headache  irritation at site where injected  nausea This list may not describe all possible side effects. Call your doctor for medical advice about side effects. You may report side effects to FDA at 1-800-FDA-1088. Where should I keep my medicine? This drug is given in a hospital or clinic and will not be stored at home. NOTE: This sheet is a summary. It may not cover all possible information. If you have questions about this medicine, talk to your doctor, pharmacist, or health care provider.  2020 Elsevier/Gold Standard (2018-04-19 16:29:31) Cyclophosphamide injection What is this medicine? CYCLOPHOSPHAMIDE (sye kloe FOSS fa mide) is a chemotherapy drug. It slows the growth of cancer cells. This medicine is used to treat many types of cancer like lymphoma, myeloma, leukemia, breast cancer, and ovarian cancer, to name a few. This medicine may be used for other purposes; ask  your health care provider or pharmacist if you have questions. COMMON BRAND NAME(S): Cytoxan, Neosar What should I tell my health care provider before I take this medicine? They need to know if you have any of these conditions:  blood disorders  history of other chemotherapy  infection  kidney disease  liver disease  recent or ongoing radiation therapy  tumors in the bone marrow  an unusual or allergic reaction to cyclophosphamide, other chemotherapy, other medicines, foods, dyes, or preservatives  pregnant or trying to get pregnant  breast-feeding How should I use this medicine? This drug is usually given as an injection into a vein or muscle or by infusion into a vein. It is administered in a hospital or clinic by a specially trained health care professional. Talk to your pediatrician regarding the use of this medicine in children. Special care may be needed. Overdosage: If you think you have taken too much of this medicine contact a poison control center or emergency room at once. NOTE: This medicine is only for you. Do not share this medicine with others. What if I miss a dose? It is important not to miss your dose. Call your doctor or health care professional if you  are unable to keep an appointment. What may interact with this medicine? This medicine may interact with the following medications:  amiodarone  amphotericin B  azathioprine  certain antiviral medicines for HIV or AIDS such as protease inhibitors (e.g., indinavir, ritonavir) and zidovudine  certain blood pressure medications such as benazepril, captopril, enalapril, fosinopril, lisinopril, moexipril, monopril, perindopril, quinapril, ramipril, trandolapril  certain cancer medications such as anthracyclines (e.g., daunorubicin, doxorubicin), busulfan, cytarabine, paclitaxel, pentostatin, tamoxifen, trastuzumab  certain diuretics such as chlorothiazide, chlorthalidone, hydrochlorothiazide, indapamide,  metolazone  certain medicines that treat or prevent blood clots like warfarin  certain muscle relaxants such as succinylcholine  cyclosporine  etanercept  indomethacin  medicines to increase blood counts like filgrastim, pegfilgrastim, sargramostim  medicines used as general anesthesia  metronidazole  natalizumab This list may not describe all possible interactions. Give your health care provider a list of all the medicines, herbs, non-prescription drugs, or dietary supplements you use. Also tell them if you smoke, drink alcohol, or use illegal drugs. Some items may interact with your medicine. What should I watch for while using this medicine? Visit your doctor for checks on your progress. This drug may make you feel generally unwell. This is not uncommon, as chemotherapy can affect healthy cells as well as cancer cells. Report any side effects. Continue your course of treatment even though you feel ill unless your doctor tells you to stop. Drink water or other fluids as directed. Urinate often, even at night. In some cases, you may be given additional medicines to help with side effects. Follow all directions for their use. Call your doctor or health care professional for advice if you get a fever, chills or sore throat, or other symptoms of a cold or flu. Do not treat yourself. This drug decreases your body's ability to fight infections. Try to avoid being around people who are sick. This medicine may increase your risk to bruise or bleed. Call your doctor or health care professional if you notice any unusual bleeding. Be careful brushing and flossing your teeth or using a toothpick because you may get an infection or bleed more easily. If you have any dental work done, tell your dentist you are receiving this medicine. You may get drowsy or dizzy. Do not drive, use machinery, or do anything that needs mental alertness until you know how this medicine affects you. Do not become  pregnant while taking this medicine or for 1 year after stopping it. Women should inform their doctor if they wish to become pregnant or think they might be pregnant. Men should not father a child while taking this medicine and for 4 months after stopping it. There is a potential for serious side effects to an unborn child. Talk to your health care professional or pharmacist for more information. Do not breast-feed an infant while taking this medicine. This medicine may interfere with the ability to have a child. This medicine has caused ovarian failure in some women. This medicine has caused reduced sperm counts in some men. You should talk with your doctor or health care professional if you are concerned about your fertility. If you are going to have surgery, tell your doctor or health care professional that you have taken this medicine. What side effects may I notice from receiving this medicine? Side effects that you should report to your doctor or health care professional as soon as possible:  allergic reactions like skin rash, itching or hives, swelling of the face, lips, or tongue  low blood  counts - this medicine may decrease the number of white blood cells, red blood cells and platelets. You may be at increased risk for infections and bleeding.  signs of infection - fever or chills, cough, sore throat, pain or difficulty passing urine  signs of decreased platelets or bleeding - bruising, pinpoint red spots on the skin, black, tarry stools, blood in the urine  signs of decreased red blood cells - unusually weak or tired, fainting spells, lightheadedness  breathing problems  dark urine  dizziness  palpitations  swelling of the ankles, feet, hands  trouble passing urine or change in the amount of urine  weight gain  yellowing of the eyes or skin Side effects that usually do not require medical attention (report to your doctor or health care professional if they continue or are  bothersome):  changes in nail or skin color  hair loss  missed menstrual periods  mouth sores  nausea, vomiting This list may not describe all possible side effects. Call your doctor for medical advice about side effects. You may report side effects to FDA at 1-800-FDA-1088. Where should I keep my medicine? This drug is given in a hospital or clinic and will not be stored at home. NOTE: This sheet is a summary. It may not cover all possible information. If you have questions about this medicine, talk to your doctor, pharmacist, or health care provider.  2020 Elsevier/Gold Standard (2012-10-22 16:22:58)

## 2019-10-11 ENCOUNTER — Other Ambulatory Visit: Payer: Self-pay | Admitting: Family

## 2019-10-11 ENCOUNTER — Other Ambulatory Visit: Payer: Self-pay

## 2019-10-11 ENCOUNTER — Inpatient Hospital Stay (HOSPITAL_BASED_OUTPATIENT_CLINIC_OR_DEPARTMENT_OTHER): Payer: Medicare Other | Admitting: Family

## 2019-10-11 ENCOUNTER — Inpatient Hospital Stay: Payer: Medicare Other

## 2019-10-11 ENCOUNTER — Encounter: Payer: Self-pay | Admitting: Family

## 2019-10-11 VITALS — BP 148/63 | HR 73 | Temp 97.3°F | Resp 18 | Wt 192.5 lb

## 2019-10-11 DIAGNOSIS — C9000 Multiple myeloma not having achieved remission: Secondary | ICD-10-CM

## 2019-10-11 DIAGNOSIS — Z79899 Other long term (current) drug therapy: Secondary | ICD-10-CM | POA: Diagnosis not present

## 2019-10-11 DIAGNOSIS — Z5111 Encounter for antineoplastic chemotherapy: Secondary | ICD-10-CM | POA: Diagnosis not present

## 2019-10-11 DIAGNOSIS — Z794 Long term (current) use of insulin: Secondary | ICD-10-CM | POA: Diagnosis not present

## 2019-10-11 DIAGNOSIS — Z5112 Encounter for antineoplastic immunotherapy: Secondary | ICD-10-CM | POA: Diagnosis not present

## 2019-10-11 LAB — CBC WITH DIFFERENTIAL (CANCER CENTER ONLY)
Abs Immature Granulocytes: 0.02 10*3/uL (ref 0.00–0.07)
Basophils Absolute: 0 10*3/uL (ref 0.0–0.1)
Basophils Relative: 0 %
Eosinophils Absolute: 0 10*3/uL (ref 0.0–0.5)
Eosinophils Relative: 1 %
HCT: 30.5 % — ABNORMAL LOW (ref 36.0–46.0)
Hemoglobin: 9.9 g/dL — ABNORMAL LOW (ref 12.0–15.0)
Immature Granulocytes: 1 %
Lymphocytes Relative: 24 %
Lymphs Abs: 0.5 10*3/uL — ABNORMAL LOW (ref 0.7–4.0)
MCH: 33.9 pg (ref 26.0–34.0)
MCHC: 32.5 g/dL (ref 30.0–36.0)
MCV: 104.5 fL — ABNORMAL HIGH (ref 80.0–100.0)
Monocytes Absolute: 0.2 10*3/uL (ref 0.1–1.0)
Monocytes Relative: 10 %
Neutro Abs: 1.5 10*3/uL — ABNORMAL LOW (ref 1.7–7.7)
Neutrophils Relative %: 64 %
Platelet Count: 122 10*3/uL — ABNORMAL LOW (ref 150–400)
RBC: 2.92 MIL/uL — ABNORMAL LOW (ref 3.87–5.11)
RDW: 15.8 % — ABNORMAL HIGH (ref 11.5–15.5)
WBC Count: 2.3 10*3/uL — ABNORMAL LOW (ref 4.0–10.5)
nRBC: 0 % (ref 0.0–0.2)

## 2019-10-11 LAB — CMP (CANCER CENTER ONLY)
ALT: 10 U/L (ref 0–44)
AST: 10 U/L — ABNORMAL LOW (ref 15–41)
Albumin: 3.9 g/dL (ref 3.5–5.0)
Alkaline Phosphatase: 112 U/L (ref 38–126)
Anion gap: 7 (ref 5–15)
BUN: 8 mg/dL (ref 8–23)
CO2: 27 mmol/L (ref 22–32)
Calcium: 9.1 mg/dL (ref 8.9–10.3)
Chloride: 103 mmol/L (ref 98–111)
Creatinine: 0.88 mg/dL (ref 0.44–1.00)
GFR, Est AFR Am: >60 mL/min (ref >60)
GFR, Estimated: >60 mL/min (ref >60)
Glucose, Bld: 199 mg/dL — ABNORMAL HIGH (ref 70–99)
Potassium: 3.2 mmol/L — ABNORMAL LOW (ref 3.5–5.1)
Sodium: 137 mmol/L (ref 135–145)
Total Bilirubin: 0.4 mg/dL (ref 0.3–1.2)
Total Protein: 7.3 g/dL (ref 6.5–8.1)

## 2019-10-11 MED ORDER — PALONOSETRON HCL INJECTION 0.25 MG/5ML
INTRAVENOUS | Status: AC
  Filled 2019-10-11: qty 5

## 2019-10-11 MED ORDER — SODIUM CHLORIDE 0.9 % IV SOLN
Freq: Once | INTRAVENOUS | Status: AC
  Administered 2019-10-11: 12:00:00 via INTRAVENOUS
  Filled 2019-10-11: qty 250

## 2019-10-11 MED ORDER — DEXAMETHASONE SODIUM PHOSPHATE 10 MG/ML IJ SOLN
INTRAMUSCULAR | Status: AC
  Filled 2019-10-11: qty 1

## 2019-10-11 MED ORDER — PALONOSETRON HCL INJECTION 0.25 MG/5ML
0.2500 mg | Freq: Once | INTRAVENOUS | Status: AC
  Administered 2019-10-11: 12:00:00 0.25 mg via INTRAVENOUS

## 2019-10-11 MED ORDER — BORTEZOMIB CHEMO SQ INJECTION 3.5 MG (2.5MG/ML)
1.3000 mg/m2 | Freq: Once | INTRAMUSCULAR | Status: AC
  Administered 2019-10-11: 2.5 mg via SUBCUTANEOUS
  Filled 2019-10-11: qty 1

## 2019-10-11 MED ORDER — SODIUM CHLORIDE 0.9 % IV SOLN
300.0000 mg/m2 | Freq: Once | INTRAVENOUS | Status: AC
  Administered 2019-10-11: 13:00:00 580 mg via INTRAVENOUS
  Filled 2019-10-11: qty 29

## 2019-10-11 MED ORDER — SODIUM CHLORIDE 0.9 % IV SOLN: 20.0000 mg | Freq: Once | INTRAVENOUS | Status: DC

## 2019-10-11 MED ORDER — DEXAMETHASONE SODIUM PHOSPHATE 10 MG/ML IJ SOLN
10.0000 mg | Freq: Once | INTRAMUSCULAR | Status: AC
  Administered 2019-10-11: 10 mg via INTRAVENOUS

## 2019-10-11 NOTE — Patient Instructions (Signed)
Bortezomib injection What is this medicine? BORTEZOMIB (bor TEZ oh mib) is a medicine that targets proteins in cancer cells and stops the cancer cells from growing. It is used to treat multiple myeloma and mantle-cell lymphoma. This medicine may be used for other purposes; ask your health care provider or pharmacist if you have questions. COMMON BRAND NAME(S): Velcade What should I tell my health care provider before I take this medicine? They need to know if you have any of these conditions:  diabetes  heart disease  irregular heartbeat  liver disease  on hemodialysis  low blood counts, like low white blood cells, platelets, or hemoglobin  peripheral neuropathy  taking medicine for blood pressure  an unusual or allergic reaction to bortezomib, mannitol, boron, other medicines, foods, dyes, or preservatives  pregnant or trying to get pregnant  breast-feeding How should I use this medicine? This medicine is for injection into a vein or for injection under the skin. It is given by a health care professional in a hospital or clinic setting. Talk to your pediatrician regarding the use of this medicine in children. Special care may be needed. Overdosage: If you think you have taken too much of this medicine contact a poison control center or emergency room at once. NOTE: This medicine is only for you. Do not share this medicine with others. What if I miss a dose? It is important not to miss your dose. Call your doctor or health care professional if you are unable to keep an appointment. What may interact with this medicine? This medicine may interact with the following medications:  ketoconazole  rifampin  ritonavir  St. John's Wort This list may not describe all possible interactions. Give your health care provider a list of all the medicines, herbs, non-prescription drugs, or dietary supplements you use. Also tell them if you smoke, drink alcohol, or use illegal drugs. Some  items may interact with your medicine. What should I watch for while using this medicine? You may get drowsy or dizzy. Do not drive, use machinery, or do anything that needs mental alertness until you know how this medicine affects you. Do not stand or sit up quickly, especially if you are an older patient. This reduces the risk of dizzy or fainting spells. In some cases, you may be given additional medicines to help with side effects. Follow all directions for their use. Call your doctor or health care professional for advice if you get a fever, chills or sore throat, or other symptoms of a cold or flu. Do not treat yourself. This drug decreases your body's ability to fight infections. Try to avoid being around people who are sick. This medicine may increase your risk to bruise or bleed. Call your doctor or health care professional if you notice any unusual bleeding. You may need blood work done while you are taking this medicine. In some patients, this medicine may cause a serious brain infection that may cause death. If you have any problems seeing, thinking, speaking, walking, or standing, tell your doctor right away. If you cannot reach your doctor, urgently seek other source of medical care. Check with your doctor or health care professional if you get an attack of severe diarrhea, nausea and vomiting, or if you sweat a lot. The loss of too much body fluid can make it dangerous for you to take this medicine. Do not become pregnant while taking this medicine or for at least 7 months after stopping it. Women should inform their doctor   if they wish to become pregnant or think they might be pregnant. Men should not father a child while taking this medicine and for at least 4 months after stopping it. There is a potential for serious side effects to an unborn child. Talk to your health care professional or pharmacist for more information. Do not breast-feed an infant while taking this medicine or for 2  months after stopping it. This medicine may interfere with the ability to have a child. You should talk with your doctor or health care professional if you are concerned about your fertility. What side effects may I notice from receiving this medicine? Side effects that you should report to your doctor or health care professional as soon as possible:  allergic reactions like skin rash, itching or hives, swelling of the face, lips, or tongue  breathing problems  changes in hearing  changes in vision  fast, irregular heartbeat  feeling faint or lightheaded, falls  pain, tingling, numbness in the hands or feet  right upper belly pain  seizures  swelling of the ankles, feet, hands  unusual bleeding or bruising  unusually weak or tired  vomiting  yellowing of the eyes or skin Side effects that usually do not require medical attention (report to your doctor or health care professional if they continue or are bothersome):  changes in emotions or moods  constipation  diarrhea  loss of appetite  headache  irritation at site where injected  nausea This list may not describe all possible side effects. Call your doctor for medical advice about side effects. You may report side effects to FDA at 1-800-FDA-1088. Where should I keep my medicine? This drug is given in a hospital or clinic and will not be stored at home. NOTE: This sheet is a summary. It may not cover all possible information. If you have questions about this medicine, talk to your doctor, pharmacist, or health care provider.  2020 Elsevier/Gold Standard (2018-04-19 16:29:31) Cyclophosphamide injection What is this medicine? CYCLOPHOSPHAMIDE (sye kloe FOSS fa mide) is a chemotherapy drug. It slows the growth of cancer cells. This medicine is used to treat many types of cancer like lymphoma, myeloma, leukemia, breast cancer, and ovarian cancer, to name a few. This medicine may be used for other purposes; ask  your health care provider or pharmacist if you have questions. COMMON BRAND NAME(S): Cytoxan, Neosar What should I tell my health care provider before I take this medicine? They need to know if you have any of these conditions:  blood disorders  history of other chemotherapy  infection  kidney disease  liver disease  recent or ongoing radiation therapy  tumors in the bone marrow  an unusual or allergic reaction to cyclophosphamide, other chemotherapy, other medicines, foods, dyes, or preservatives  pregnant or trying to get pregnant  breast-feeding How should I use this medicine? This drug is usually given as an injection into a vein or muscle or by infusion into a vein. It is administered in a hospital or clinic by a specially trained health care professional. Talk to your pediatrician regarding the use of this medicine in children. Special care may be needed. Overdosage: If you think you have taken too much of this medicine contact a poison control center or emergency room at once. NOTE: This medicine is only for you. Do not share this medicine with others. What if I miss a dose? It is important not to miss your dose. Call your doctor or health care professional if you   are unable to keep an appointment. What may interact with this medicine? This medicine may interact with the following medications:  amiodarone  amphotericin B  azathioprine  certain antiviral medicines for HIV or AIDS such as protease inhibitors (e.g., indinavir, ritonavir) and zidovudine  certain blood pressure medications such as benazepril, captopril, enalapril, fosinopril, lisinopril, moexipril, monopril, perindopril, quinapril, ramipril, trandolapril  certain cancer medications such as anthracyclines (e.g., daunorubicin, doxorubicin), busulfan, cytarabine, paclitaxel, pentostatin, tamoxifen, trastuzumab  certain diuretics such as chlorothiazide, chlorthalidone, hydrochlorothiazide, indapamide,  metolazone  certain medicines that treat or prevent blood clots like warfarin  certain muscle relaxants such as succinylcholine  cyclosporine  etanercept  indomethacin  medicines to increase blood counts like filgrastim, pegfilgrastim, sargramostim  medicines used as general anesthesia  metronidazole  natalizumab This list may not describe all possible interactions. Give your health care provider a list of all the medicines, herbs, non-prescription drugs, or dietary supplements you use. Also tell them if you smoke, drink alcohol, or use illegal drugs. Some items may interact with your medicine. What should I watch for while using this medicine? Visit your doctor for checks on your progress. This drug may make you feel generally unwell. This is not uncommon, as chemotherapy can affect healthy cells as well as cancer cells. Report any side effects. Continue your course of treatment even though you feel ill unless your doctor tells you to stop. Drink water or other fluids as directed. Urinate often, even at night. In some cases, you may be given additional medicines to help with side effects. Follow all directions for their use. Call your doctor or health care professional for advice if you get a fever, chills or sore throat, or other symptoms of a cold or flu. Do not treat yourself. This drug decreases your body's ability to fight infections. Try to avoid being around people who are sick. This medicine may increase your risk to bruise or bleed. Call your doctor or health care professional if you notice any unusual bleeding. Be careful brushing and flossing your teeth or using a toothpick because you may get an infection or bleed more easily. If you have any dental work done, tell your dentist you are receiving this medicine. You may get drowsy or dizzy. Do not drive, use machinery, or do anything that needs mental alertness until you know how this medicine affects you. Do not become  pregnant while taking this medicine or for 1 year after stopping it. Women should inform their doctor if they wish to become pregnant or think they might be pregnant. Men should not father a child while taking this medicine and for 4 months after stopping it. There is a potential for serious side effects to an unborn child. Talk to your health care professional or pharmacist for more information. Do not breast-feed an infant while taking this medicine. This medicine may interfere with the ability to have a child. This medicine has caused ovarian failure in some women. This medicine has caused reduced sperm counts in some men. You should talk with your doctor or health care professional if you are concerned about your fertility. If you are going to have surgery, tell your doctor or health care professional that you have taken this medicine. What side effects may I notice from receiving this medicine? Side effects that you should report to your doctor or health care professional as soon as possible:  allergic reactions like skin rash, itching or hives, swelling of the face, lips, or tongue  low blood   counts - this medicine may decrease the number of white blood cells, red blood cells and platelets. You may be at increased risk for infections and bleeding.  signs of infection - fever or chills, cough, sore throat, pain or difficulty passing urine  signs of decreased platelets or bleeding - bruising, pinpoint red spots on the skin, black, tarry stools, blood in the urine  signs of decreased red blood cells - unusually weak or tired, fainting spells, lightheadedness  breathing problems  dark urine  dizziness  palpitations  swelling of the ankles, feet, hands  trouble passing urine or change in the amount of urine  weight gain  yellowing of the eyes or skin Side effects that usually do not require medical attention (report to your doctor or health care professional if they continue or are  bothersome):  changes in nail or skin color  hair loss  missed menstrual periods  mouth sores  nausea, vomiting This list may not describe all possible side effects. Call your doctor for medical advice about side effects. You may report side effects to FDA at 1-800-FDA-1088. Where should I keep my medicine? This drug is given in a hospital or clinic and will not be stored at home. NOTE: This sheet is a summary. It may not cover all possible information. If you have questions about this medicine, talk to your doctor, pharmacist, or health care provider.  2020 Elsevier/Gold Standard (2012-10-22 16:22:58)

## 2019-10-11 NOTE — Progress Notes (Signed)
Hematology and Oncology Follow Up Visit  SYDNA THACKERY [id number] [birth year] 68 y.o. 10/11/2019   Principle Diagnosis:  IgG Kappa myeloma -- 1p-, 13q-, 16q- and t(11:14)  Past Therapy: RVD -- start on 05/09/2019 -- d/c revlimid on 06/14/2019 -- d/c on 06/28/2019 S/P Kyphoplasty at L2  Current Therapy:   CyBorD -- started on 07/12/2019- s/p cycle 4 XRT to lumbar spine -- to be done in [place] Xgeva 120 mg sq q 3 months -- next dose on11/2020   Interim History:  Ms. Leuck is here today for follow-up and treatment. She is doing well but had issues with her last cycle where her blood glucose went up to 500 the day after and then she had hyperglycemia for another 2 days. She denies "stacking" her insulin and stated that she stayed on her normal regimen. I spoke with Dr. Maylon Peppers and we will reduce her Decadron with treatment to 10 mg IV.  No fever, chills, cough, rash, dizziness, SOB, chest pain, palpitations, abdominal pain or changes in bowel or bladder habits. No episodes of bleeding, no bruising or petechiae.   No swelling, numbness or tingling in her extremities at this time.  She stumbled and fell a few weeks ago while decorating for her sons baby shower.  She states that this flared up her sciatica but that it seems to be improving.  She has maintained a good appetite and is staying well hydrated. Her weight is stable.  Greasy foods make her nauseated so she avoids these. She has had no vomiting.   ECOG Performance Status: 1 - Symptomatic but completely ambulatory  Medications:  Allergies as of 10/11/2019      Reactions   Metformin Nausea Only   "severe GI"      Medication List       Accurate as of October 11, 2019 11:14 AM. If you have any questions, ask your nurse or doctor.        ALPRAZolam 0.5 MG tablet Commonly known as: XANAX Take 0.5 mg by mouth daily.   atorvastatin 10 MG tablet Commonly known as: LIPITOR Take 10 mg by mouth daily.   B-D ULTRAFINE  III SHORT PEN 31G X 8 MM Misc Generic drug: Insulin Pen Needle USE ONE PEN NEEDLE 4 TIMES DAILY   cyclobenzaprine 5 MG tablet Commonly known as: FLEXERIL Take 1 tablet (5 mg total) by mouth 3 (three) times daily as needed.   FreeStyle [employer] Use one sensor every 10 days.   glipiZIDE 10 MG 24 hr tablet Commonly known as: GLUCOTROL XL TAKE 1 TABLET BY MOUTH ONCE DAILY FOR SUGARS   insulin lispro 100 UNIT/ML KiwkPen Commonly known as: HumaLOG KwikPen Inject 0.22-0.28 mLs (22-28 Units total) into the skin 3 (three) times daily with meals.   latanoprost 0.005 % ophthalmic solution Commonly known as: XALATAN Place 1 drop into both eyes at bedtime.   NovoLOG 100 UNIT/ML injection Generic drug: insulin aspart INJECT 35 UNITS SUBCUTANEOUSLY WITH MEALS   PARoxetine 30 MG tablet Commonly known as: PAXIL Take 30 mg by mouth every morning.   potassium chloride SA 20 MEQ tablet Commonly known as: KLOR-CON Take 2 tablets (40 mEq total) by mouth daily.       Allergies:  Allergies  Allergen Reactions  . Metformin Nausea Only    "severe GI"    Past Medical History, Surgical history, Social history, and Family History were reviewed and updated.  Review of Systems: All other 10 point review of systems is  negative.   Physical Exam:  weight is 192 lb 8 oz (87.3 kg). Her temporal temperature is 97.3 F (36.3 C) (abnormal). Her blood pressure is 148/63 (abnormal) and her pulse is 73. Her respiration is 18.   Wt Readings from Last 3 Encounters:  10/11/19 192 lb 8 oz (87.3 kg)  09/13/19 191 lb 12.8 oz (87 kg)  08/30/19 190 lb 6 oz (86.4 kg)    Ocular: Sclerae unicteric, pupils equal, round and reactive to light Ear-nose-throat: Oropharynx clear, dentition fair Lymphatic: No cervical or supraclavicular adenopathy Lungs no rales or rhonchi, good excursion bilaterally Heart regular rate and rhythm, no murmur appreciated Abd soft, nontender, positive bowel sounds,  no liver or spleen tip palpated on exam, no fluid wave  MSK no focal spinal tenderness, no joint edema Neuro: non-focal, well-oriented, appropriate affect Breasts: Deferred   Lab Results  Component Value Date   WBC 2.3 (L) 10/11/2019   HGB 9.9 (L) 10/11/2019   HCT 30.5 (L) 10/11/2019   MCV 104.5 (H) 10/11/2019   PLT 122 (L) 10/11/2019   Lab Results  Component Value Date   FERRITIN 406 (H) 07/12/2019   IRON 125 07/12/2019   TIBC 283 07/12/2019   UIBC 157 07/12/2019   IRONPCTSAT 44 07/12/2019   Lab Results  Component Value Date   RBC 2.92 (L) 10/11/2019   Lab Results  Component Value Date   KPAFRELGTCHN 243.3 (H) 09/20/2019   LAMBDASER 5.7 09/20/2019   KAPLAMBRATIO 42.68 (H) 09/20/2019   Lab Results  Component Value Date   IGGSERUM 2,192 (H) 09/20/2019   IGA 24 (L) 09/20/2019   IGMSERUM 26 09/20/2019   Lab Results  Component Value Date   TOTALPROTELP 7.1 09/20/2019   ALBUMINELP 3.4 09/20/2019   A1GS 0.2 09/20/2019   A2GS 1.0 09/20/2019   BETS 0.8 09/20/2019   GAMS 1.7 09/20/2019   MSPIKE 1.5 (H) 09/20/2019   SPEI Comment 09/20/2019     Chemistry      Component Value Date/Time   NA 140 10/04/2019 1131   K 3.5 10/04/2019 1131   CL 106 10/04/2019 1131   CO2 27 10/04/2019 1131   BUN 9 10/04/2019 1131   CREATININE 0.86 10/04/2019 1131   CREATININE 0.65 01/12/2018 1004      Component Value Date/Time   CALCIUM 8.7 (L) 10/04/2019 1131   ALKPHOS 120 10/04/2019 1131   AST 10 (L) 10/04/2019 1131   ALT 10 10/04/2019 1131   BILITOT 0.4 10/04/2019 1131       Impression and Plan: Ms. Spohr is a very pleasant 68 yo caucasian female with IgG kappa myeloma, genetics place her in high risk group.  She is doing well but did have the issue with hyperglycemia/hypoglycemia after her last cycle.  We reduced her Decadron to 10 mg and hopefully this will prevent any issues.  We will proceed with treatment today as planned.  We will see her back in another 2 weeks for  follow-up. She will get a new treatment calendar today.  She will contact our office with any questions or concerns. We can certainly see her sooner if needed.   Laverna Peace, NP 10/20/202011:14 AM

## 2019-10-12 LAB — KAPPA/LAMBDA LIGHT CHAINS
Kappa free light chain: 301 mg/L — ABNORMAL HIGH (ref 3.3–19.4)
Kappa, lambda light chain ratio: 48.55 — ABNORMAL HIGH (ref 0.26–1.65)
Lambda free light chains: 6.2 mg/L (ref 5.7–26.3)

## 2019-10-13 LAB — PROTEIN ELECTROPHORESIS, SERUM
A/G Ratio: 0.9 (ref 0.7–1.7)
Albumin ELP: 3.4 g/dL (ref 2.9–4.4)
Alpha-1-Globulin: 0.2 g/dL (ref 0.0–0.4)
Alpha-2-Globulin: 0.9 g/dL (ref 0.4–1.0)
Beta Globulin: 0.9 g/dL (ref 0.7–1.3)
Gamma Globulin: 1.7 g/dL (ref 0.4–1.8)
Globulin, Total: 3.8 g/dL (ref 2.2–3.9)
M-Spike, %: 1.6 g/dL — ABNORMAL HIGH
Total Protein ELP: 7.2 g/dL (ref 6.0–8.5)

## 2019-10-13 LAB — IGG, IGA, IGM
IgA: 24 mg/dL — ABNORMAL LOW (ref 87–352)
IgG (Immunoglobin G), Serum: 2242 mg/dL — ABNORMAL HIGH (ref 586–1602)
IgM (Immunoglobulin M), Srm: 22 mg/dL — ABNORMAL LOW (ref 26–217)

## 2019-10-17 DIAGNOSIS — Z23 Encounter for immunization: Secondary | ICD-10-CM | POA: Diagnosis not present

## 2019-10-19 ENCOUNTER — Inpatient Hospital Stay: Payer: Medicare Other

## 2019-10-19 ENCOUNTER — Inpatient Hospital Stay (HOSPITAL_BASED_OUTPATIENT_CLINIC_OR_DEPARTMENT_OTHER): Payer: Medicare Other | Admitting: Hematology & Oncology

## 2019-10-19 ENCOUNTER — Other Ambulatory Visit: Payer: Self-pay | Admitting: *Deleted

## 2019-10-19 ENCOUNTER — Other Ambulatory Visit: Payer: Medicare Other

## 2019-10-19 ENCOUNTER — Other Ambulatory Visit: Payer: Self-pay

## 2019-10-19 ENCOUNTER — Telehealth: Payer: Self-pay | Admitting: Hematology & Oncology

## 2019-10-19 ENCOUNTER — Encounter: Payer: Self-pay | Admitting: Hematology & Oncology

## 2019-10-19 VITALS — BP 146/58 | HR 95 | Temp 96.9°F | Resp 20 | Wt 189.1 lb

## 2019-10-19 DIAGNOSIS — C9000 Multiple myeloma not having achieved remission: Secondary | ICD-10-CM

## 2019-10-19 DIAGNOSIS — Z5111 Encounter for antineoplastic chemotherapy: Secondary | ICD-10-CM | POA: Diagnosis not present

## 2019-10-19 DIAGNOSIS — Z79899 Other long term (current) drug therapy: Secondary | ICD-10-CM | POA: Diagnosis not present

## 2019-10-19 DIAGNOSIS — Z5112 Encounter for antineoplastic immunotherapy: Secondary | ICD-10-CM | POA: Diagnosis not present

## 2019-10-19 DIAGNOSIS — Z794 Long term (current) use of insulin: Secondary | ICD-10-CM | POA: Diagnosis not present

## 2019-10-19 LAB — CMP (CANCER CENTER ONLY)
ALT: 10 U/L (ref 0–44)
AST: 10 U/L — ABNORMAL LOW (ref 15–41)
Albumin: 4 g/dL (ref 3.5–5.0)
Alkaline Phosphatase: 120 U/L (ref 38–126)
Anion gap: 11 (ref 5–15)
BUN: 10 mg/dL (ref 8–23)
CO2: 24 mmol/L (ref 22–32)
Calcium: 8.8 mg/dL — ABNORMAL LOW (ref 8.9–10.3)
Chloride: 101 mmol/L (ref 98–111)
Creatinine: 0.87 mg/dL (ref 0.44–1.00)
GFR, Est AFR Am: >60 mL/min (ref >60)
GFR, Estimated: >60 mL/min (ref >60)
Glucose, Bld: 363 mg/dL — ABNORMAL HIGH (ref 70–99)
Potassium: 3.7 mmol/L (ref 3.5–5.1)
Sodium: 136 mmol/L (ref 135–145)
Total Bilirubin: 0.4 mg/dL (ref 0.3–1.2)
Total Protein: 7.6 g/dL (ref 6.5–8.1)

## 2019-10-19 LAB — CBC WITH DIFFERENTIAL (CANCER CENTER ONLY)
Abs Immature Granulocytes: 0.03 10*3/uL (ref 0.00–0.07)
Basophils Absolute: 0 10*3/uL (ref 0.0–0.1)
Basophils Relative: 0 %
Eosinophils Absolute: 0 10*3/uL (ref 0.0–0.5)
Eosinophils Relative: 1 %
HCT: 30.9 % — ABNORMAL LOW (ref 36.0–46.0)
Hemoglobin: 10.2 g/dL — ABNORMAL LOW (ref 12.0–15.0)
Immature Granulocytes: 1 %
Lymphocytes Relative: 18 %
Lymphs Abs: 0.4 10*3/uL — ABNORMAL LOW (ref 0.7–4.0)
MCH: 34.5 pg — ABNORMAL HIGH (ref 26.0–34.0)
MCHC: 33 g/dL (ref 30.0–36.0)
MCV: 104.4 fL — ABNORMAL HIGH (ref 80.0–100.0)
Monocytes Absolute: 0.2 10*3/uL (ref 0.1–1.0)
Monocytes Relative: 11 %
Neutro Abs: 1.6 10*3/uL — ABNORMAL LOW (ref 1.7–7.7)
Neutrophils Relative %: 69 %
Platelet Count: 114 10*3/uL — ABNORMAL LOW (ref 150–400)
RBC: 2.96 MIL/uL — ABNORMAL LOW (ref 3.87–5.11)
RDW: 15.2 % (ref 11.5–15.5)
WBC Count: 2.3 10*3/uL — ABNORMAL LOW (ref 4.0–10.5)
nRBC: 0 % (ref 0.0–0.2)

## 2019-10-19 LAB — SAMPLE TO BLOOD BANK

## 2019-10-19 LAB — TYPE AND SCREEN
ABO/RH(D): O POS
Antibody Screen: NEGATIVE

## 2019-10-19 LAB — PRETREATMENT RBC PHENOTYPE

## 2019-10-19 NOTE — Telephone Encounter (Signed)
Scheduled appts per 10/28 LOS. Patient aware of date/times/location

## 2019-10-19 NOTE — Progress Notes (Signed)
Hematology and Oncology Follow Up Visit  JUNEROSE ANDREASON [id number] [birth year] 68 y.o. 10/19/2019   Principle Diagnosis:  IgG Kappa myeloma -- 1p-, 13q-, 16q- and t(11:14)  Past Therapy: RVD -- start on 05/09/2019 -- d/c revlimid on 06/14/2019 -- d/c on 06/28/2019 S/P Kyphoplasty at L2  Current Therapy:   Kyprolis/Faspro -- start on 10/26/2019 CyBorD -- started on 07/12/2019- s/p cycle 4 -- d/c on 10/19/2019 due to disease progression XRT to lumbar spine -- to be done in [place] Xgeva 120 mg sq q 3 months -- next dose on11/2020   Interim History:  Ms. Tumlin is here today for follow-up and treatment.  Unfortunately, I think it is apparent that her myeloma is just not responding to the Cytoxan/Velcade protocol.  We checked her myeloma studies okay weeks ago, the M spike was 1.6 g/dL.  Her IgG level is up to 2242 mg/dL.  Her kappa light chain was 30 mg/dL.  I had to believe that her cytogenetic abnormalities are causing this issue.  I think we will have to make a change with her protocol.  I think that Kyprolis/daratumumab would not be a bad idea.  Now that we have subcutaneous daratumumab (FasPro), we can hopefully avoid a Port-A-Cath with her.  Her blood sugars were quite high today.  We are going to drop any Decadron from future protocols.  Thankfully she has not had any more skin infections with MRSA.  She does not complain of any pain.  Her back seems to be holding its own right now.   She has had no fever.  She has had no rashes.   There is been no change in bowel or bladder habits.  Ed so she avoids these. She has had no vomiting.   Overall, her performance status is ECOG 1.  Medications:  Allergies as of 10/19/2019      Reactions   Metformin Nausea Only   "severe GI"      Medication List       Accurate as of October 19, 2019 11:16 AM. If you have any questions, ask your nurse or doctor.        ALPRAZolam 0.5 MG tablet Commonly known as: XANAX Take  0.5 mg by mouth at bedtime as needed.   atorvastatin 10 MG tablet Commonly known as: LIPITOR Take 10 mg by mouth daily.   B-D ULTRAFINE III SHORT PEN 31G X 8 MM Misc Generic drug: Insulin Pen Needle USE ONE PEN NEEDLE 4 TIMES DAILY   cyclobenzaprine 5 MG tablet Commonly known as: FLEXERIL Take 1 tablet (5 mg total) by mouth 3 (three) times daily as needed.   FreeStyle [employer] Use one sensor every 10 days.   glipiZIDE 10 MG 24 hr tablet Commonly known as: GLUCOTROL XL TAKE 1 TABLET BY MOUTH ONCE DAILY FOR SUGARS   HYDROcodone-acetaminophen 5-325 MG tablet Commonly known as: NORCO/VICODIN Take 1 tablet by mouth every 6 (six) hours as needed for moderate pain. Re[facility] from [facility] ED.   insulin lispro 100 UNIT/ML KiwkPen Commonly known as: HumaLOG KwikPen Inject 0.22-0.28 mLs (22-28 Units total) into the skin 3 (three) times daily with meals.   latanoprost 0.005 % ophthalmic solution Commonly known as: XALATAN Place 1 drop into both eyes at bedtime.   lisinopril 10 MG tablet Commonly known as: ZESTRIL Take 10 mg by mouth daily.   NovoLOG 100 UNIT/ML injection Generic drug: insulin aspart INJECT 35 UNITS SUBCUTANEOUSLY WITH MEALS   PARoxetine 30 MG tablet Commonly  known as: PAXIL Take 30 mg by mouth every morning.   potassium chloride SA 20 MEQ tablet Commonly known as: KLOR-CON Take 2 tablets (40 mEq total) by mouth daily.   PRESCRIPTION MEDICATION Onmipod dash-insulin pump.Changes every 2 days. 5-25units ac based on blood sugar.       Allergies:  Allergies  Allergen Reactions  . Metformin Nausea Only    "severe GI"    Past Medical History, Surgical history, Social history, and Family History were reviewed and updated.  Review of Systems: Review of Systems  Constitutional: Negative.   HENT: Negative.   Eyes: Negative.   Respiratory: Negative.   Cardiovascular: Negative.   Gastrointestinal: Negative.   Genitourinary: Negative.    Musculoskeletal: Negative.   Skin: Negative.   Neurological: Negative.   Endo/Heme/Allergies: Negative.   Psychiatric/Behavioral: Negative.      Physical Exam:  weight is 189 lb 1.9 oz (85.8 kg). Her temporal temperature is 96.9 F (36.1 C) (abnormal). Her blood pressure is 146/58 (abnormal) and her pulse is 95. Her respiration is 20 and oxygen saturation is 99%.   Wt Readings from Last 3 Encounters:  10/19/19 189 lb 1.9 oz (85.8 kg)  10/11/19 192 lb 8 oz (87.3 kg)  09/13/19 191 lb 12.8 oz (87 kg)    Physical Exam Vitals signs reviewed.  HENT:     Head: Normocephalic and atraumatic.  Eyes:     Pupils: Pupils are equal, round, and reactive to light.  Neck:     Musculoskeletal: Normal range of motion.  Cardiovascular:     Rate and Rhythm: Normal rate and regular rhythm.     Heart sounds: Normal heart sounds.  Pulmonary:     Effort: Pulmonary effort is normal.     Breath sounds: Normal breath sounds.  Abdominal:     General: Bowel sounds are normal.     Palpations: Abdomen is soft.  Musculoskeletal: Normal range of motion.        General: No tenderness or deformity.  Lymphadenopathy:     Cervical: No cervical adenopathy.  Skin:    General: Skin is warm and dry.     Findings: No erythema or rash.  Neurological:     Mental Status: She is alert and oriented to person, place, and time.  Psychiatric:        Behavior: Behavior normal.        Thought Content: Thought content normal.        Judgment: Judgment normal.      Lab Results  Component Value Date   WBC 2.3 (L) 10/19/2019   HGB 10.2 (L) 10/19/2019   HCT 30.9 (L) 10/19/2019   MCV 104.4 (H) 10/19/2019   PLT 114 (L) 10/19/2019   Lab Results  Component Value Date   FERRITIN 406 (H) 07/12/2019   IRON 125 07/12/2019   TIBC 283 07/12/2019   UIBC 157 07/12/2019   IRONPCTSAT 44 07/12/2019   Lab Results  Component Value Date   RBC 2.96 (L) 10/19/2019   Lab Results  Component Value Date   KPAFRELGTCHN  301.0 (H) 10/11/2019   LAMBDASER 6.2 10/11/2019   KAPLAMBRATIO 48.55 (H) 10/11/2019   Lab Results  Component Value Date   IGGSERUM 2,242 (H) 10/11/2019   IGA 24 (L) 10/11/2019   IGMSERUM 22 (L) 10/11/2019   Lab Results  Component Value Date   TOTALPROTELP 7.2 10/11/2019   ALBUMINELP 3.4 10/11/2019   A1GS 0.2 10/11/2019   A2GS 0.9 10/11/2019   BETS 0.9 10/11/2019  GAMS 1.7 10/11/2019   MSPIKE 1.6 (H) 10/11/2019   SPEI Comment 10/11/2019     Chemistry      Component Value Date/Time   NA 136 10/19/2019 0957   K 3.7 10/19/2019 0957   CL 101 10/19/2019 0957   CO2 24 10/19/2019 0957   BUN 10 10/19/2019 0957   CREATININE 0.87 10/19/2019 0957   CREATININE 0.65 01/12/2018 1004      Component Value Date/Time   CALCIUM 8.8 (L) 10/19/2019 0957   ALKPHOS 120 10/19/2019 0957   AST 10 (L) 10/19/2019 0957   ALT 10 10/19/2019 0957   BILITOT 0.4 10/19/2019 0957       Impression and Plan: Ms. Langreck is a very pleasant 68 yo caucasian female with IgG kappa myeloma.  I think that her cytogenetics place her in high risk group.   She certainly has not yet responded like I thought she would.  Again, we are going to have to make a change in her protocol.  I do think that Kyprolis/daratumumab would be reasonable.  I would like to believe that with the use of monoclonal antibody therapy, that we might be able to overcome any resistance that is given by her chromosomal abnormalities.  I talked to her for about 40 minutes or so.  I gave her information sheets about both Kyprolis and daratumumab.  We will get a echocardiogram on her.  I want to make sure that her cardiac status is okay.  We will have to watch her blood counts closely.  We will try to get started with treatment next week.  I want to try to treat her 3 weeks on and 1 week off.  I answered all of her questions.  I will plan to see her back when she starts her second cycle of treatment which will be right after  Thanksgiving.   [name], MD 10/28/202011:16 AM

## 2019-10-19 NOTE — Progress Notes (Signed)
pretr

## 2019-10-19 NOTE — Progress Notes (Signed)
DISCONTINUE ON PATHWAY REGIMEN - Multiple Myeloma and Other Plasma Cell Dyscrasias     A cycle is every 21 days:     Bortezomib      Cyclophosphamide      Dexamethasone   **Always confirm dose/schedule in your pharmacy ordering system**  REASON: Disease Progression PRIOR TREATMENT: [id number]: CyBord (Cyclophosphamide 500 mg/m2 IV D1,8 + Bortezomib 1.3 mg/m2 SUBQ D1,4,8,11 + Dexamethasone 40 mg PO D1,8,15) q21 Days x 8 Cycles TREATMENT RESPONSE: Stable Disease (SD)  START ON PATHWAY REGIMEN - Multiple Myeloma and Other Plasma Cell Dyscrasias     Cycles 1 and 2: A cycle is every 28 days:     Daratumumab and hyaluronidase-fihj    Cycles 3 through 6: A cycle is every 28 days:     Daratumumab and hyaluronidase-fihj    Cycles 7 and beyond: A cycle is every 28 days:     Daratumumab and hyaluronidase-fihj   **Always confirm dose/schedule in your pharmacy ordering system**  Patient Characteristics: Relapsed / Refractory, Second through Fourth Lines of Therapy R-ISS Staging: Not Applicable Disease Classification: Refractory Line of Therapy: Third Line Intent of Therapy: Curative Intent, Not Discussed with Patient

## 2019-10-21 ENCOUNTER — Other Ambulatory Visit: Payer: Self-pay

## 2019-10-21 ENCOUNTER — Ambulatory Visit (HOSPITAL_COMMUNITY)
Admission: RE | Admit: 2019-10-21 | Discharge: 2019-10-21 | Disposition: A | Discharge status: Home / Self Care | Payer: Medicare Other | Source: Ambulatory Visit | Attending: Hematology & Oncology | Admitting: Hematology & Oncology

## 2019-10-21 DIAGNOSIS — Z01810 Encounter for preprocedural cardiovascular examination: Secondary | ICD-10-CM | POA: Insufficient documentation

## 2019-10-21 DIAGNOSIS — C9000 Multiple myeloma not having achieved remission: Secondary | ICD-10-CM | POA: Insufficient documentation

## 2019-10-21 LAB — MULTIPLE MYELOMA PANEL, SERUM
Albumin SerPl Elph-Mcnc: 3.5 g/dL (ref 2.9–4.4)
Albumin/Glob SerPl: 1 (ref 0.7–1.7)
Alpha 1: 0.2 g/dL (ref 0.0–0.4)
Alpha2 Glob SerPl Elph-Mcnc: 1 g/dL (ref 0.4–1.0)
B-Globulin SerPl Elph-Mcnc: 0.9 g/dL (ref 0.7–1.3)
Gamma Glob SerPl Elph-Mcnc: 1.7 g/dL (ref 0.4–1.8)
Globulin, Total: 3.8 g/dL (ref 2.2–3.9)
IgA: 24 mg/dL — ABNORMAL LOW (ref 87–352)
IgG (Immunoglobin G), Serum: 2244 mg/dL — ABNORMAL HIGH (ref 586–1602)
IgM (Immunoglobulin M), Srm: 26 mg/dL (ref 26–217)
M Protein SerPl Elph-Mcnc: 1.6 g/dL — ABNORMAL HIGH
Total Protein ELP: 7.3 g/dL (ref 6.0–8.5)

## 2019-10-21 LAB — IMMUNOFIXATION ELECTROPHORESIS
IgA: 25 mg/dL — ABNORMAL LOW (ref 87–352)
IgG (Immunoglobin G), Serum: 2410 mg/dL — ABNORMAL HIGH (ref 586–1602)
IgM (Immunoglobulin M), Srm: 29 mg/dL (ref 26–217)
Total Protein ELP: 7.5 g/dL (ref 6.0–8.5)

## 2019-10-21 NOTE — Progress Notes (Signed)
*  PRELIMINARY RESULTS* Echocardiogram 2D Echocardiogram has been performed.  Vicki Moon 10/21/2019, 3:01 PM

## 2019-10-24 DIAGNOSIS — E1169 Type 2 diabetes mellitus with other specified complication: Secondary | ICD-10-CM | POA: Diagnosis not present

## 2019-10-24 DIAGNOSIS — E1165 Type 2 diabetes mellitus with hyperglycemia: Secondary | ICD-10-CM | POA: Diagnosis not present

## 2019-10-24 DIAGNOSIS — E782 Mixed hyperlipidemia: Secondary | ICD-10-CM | POA: Diagnosis not present

## 2019-10-24 DIAGNOSIS — I10 Essential (primary) hypertension: Secondary | ICD-10-CM | POA: Diagnosis not present

## 2019-10-24 DIAGNOSIS — E119 Type 2 diabetes mellitus without complications: Secondary | ICD-10-CM | POA: Diagnosis not present

## 2019-10-26 ENCOUNTER — Inpatient Hospital Stay: Payer: Medicare Other | Attending: Hematology & Oncology

## 2019-10-26 ENCOUNTER — Other Ambulatory Visit: Payer: Self-pay | Admitting: Hematology & Oncology

## 2019-10-26 ENCOUNTER — Other Ambulatory Visit: Payer: Self-pay

## 2019-10-26 VITALS — BP 145/55 | HR 82 | Temp 97.9°F | Resp 19

## 2019-10-26 DIAGNOSIS — Z5111 Encounter for antineoplastic chemotherapy: Secondary | ICD-10-CM | POA: Diagnosis not present

## 2019-10-26 DIAGNOSIS — C9000 Multiple myeloma not having achieved remission: Secondary | ICD-10-CM | POA: Diagnosis not present

## 2019-10-26 DIAGNOSIS — Z5112 Encounter for antineoplastic immunotherapy: Secondary | ICD-10-CM | POA: Insufficient documentation

## 2019-10-26 MED ORDER — SODIUM CHLORIDE 0.9 % IV SOLN
Freq: Once | INTRAVENOUS | Status: AC
  Administered 2019-10-26: 09:00:00 via INTRAVENOUS
  Filled 2019-10-26: qty 250

## 2019-10-26 MED ORDER — DIPHENHYDRAMINE HCL 25 MG PO CAPS
ORAL_CAPSULE | ORAL | Status: AC
  Filled 2019-10-26: qty 2

## 2019-10-26 MED ORDER — DEXTROSE 5 % IV SOLN
21.0000 mg/m2 | Freq: Once | INTRAVENOUS | Status: AC
  Administered 2019-10-26: 13:00:00 40 mg via INTRAVENOUS
  Filled 2019-10-26: qty 5

## 2019-10-26 MED ORDER — MONTELUKAST SODIUM 10 MG PO TABS
10.0000 mg | ORAL_TABLET | Freq: Once | ORAL | Status: AC
  Administered 2019-10-26: 10 mg via ORAL
  Filled 2019-10-26: qty 1

## 2019-10-26 MED ORDER — DIPHENHYDRAMINE HCL 25 MG PO CAPS
50.0000 mg | ORAL_CAPSULE | Freq: Once | ORAL | Status: AC
  Administered 2019-10-26: 09:00:00 50 mg via ORAL

## 2019-10-26 MED ORDER — SODIUM CHLORIDE 0.9 % IV SOLN
Freq: Once | INTRAVENOUS | Status: AC
  Administered 2019-10-26: 10:00:00 via INTRAVENOUS
  Filled 2019-10-26: qty 250

## 2019-10-26 MED ORDER — DARATUMUMAB-HYALURONIDASE-FIHJ 1800-30000 MG-UT/15ML ~~LOC~~ SOLN
1800.0000 mg | Freq: Once | SUBCUTANEOUS | Status: AC
  Administered 2019-10-26: 1800 mg via SUBCUTANEOUS
  Filled 2019-10-26: qty 15

## 2019-10-26 MED ORDER — ACETAMINOPHEN 325 MG PO TABS
650.0000 mg | ORAL_TABLET | Freq: Once | ORAL | Status: AC
  Administered 2019-10-26: 650 mg via ORAL

## 2019-10-26 MED ORDER — ACETAMINOPHEN 325 MG PO TABS
ORAL_TABLET | ORAL | Status: AC
  Filled 2019-10-26: qty 2

## 2019-10-26 MED ORDER — SODIUM CHLORIDE 0.9 % IV SOLN
40.0000 mg | Freq: Once | INTRAVENOUS | Status: AC
  Administered 2019-10-26: 40 mg via INTRAVENOUS
  Filled 2019-10-26: qty 4

## 2019-10-26 NOTE — Patient Instructions (Signed)
[facility] Discharge Instructions for Patients Receiving Chemotherapy  Today you received the following chemotherapy agents Darzalex and Kyprolis  To help prevent nausea and vomiting after your treatment, we encourage you to take your nausea medication as prescribed by MD.   If you develop nausea and vomiting that is not controlled by your nausea medication, call the clinic.   BELOW ARE SYMPTOMS THAT SHOULD BE REPORTED IMMEDIATELY:  *FEVER GREATER THAN 100.5 F  *CHILLS WITH OR WITHOUT FEVER  NAUSEA AND VOMITING THAT IS NOT CONTROLLED WITH YOUR NAUSEA MEDICATION  *UNUSUAL SHORTNESS OF BREATH  *UNUSUAL BRUISING OR BLEEDING  TENDERNESS IN MOUTH AND THROAT WITH OR WITHOUT PRESENCE OF ULCERS  *URINARY PROBLEMS  *BOWEL PROBLEMS  UNUSUAL RASH Items with * indicate a potential emergency and should be followed up as soon as possible.  Feel free to call the clinic should you have any questions or concerns. The clinic phone number is [phone].  Please show the [facility] at check-in to the Emergency Department and triage nurse.

## 2019-10-28 ENCOUNTER — Telehealth: Payer: Self-pay

## 2019-10-28 NOTE — Telephone Encounter (Signed)
Left a voicemail for patient for chemo follow up call post first time of new regimen on 10/26/2019. Advised patient to call us back if she has any questions or adverse effects.

## 2019-11-01 ENCOUNTER — Ambulatory Visit: Payer: Medicare Other

## 2019-11-01 ENCOUNTER — Other Ambulatory Visit: Payer: Medicare Other

## 2019-11-01 ENCOUNTER — Ambulatory Visit: Payer: Medicare Other | Admitting: Family

## 2019-11-01 ENCOUNTER — Other Ambulatory Visit: Payer: Self-pay | Admitting: *Deleted

## 2019-11-01 DIAGNOSIS — C9000 Multiple myeloma not having achieved remission: Secondary | ICD-10-CM

## 2019-11-02 ENCOUNTER — Inpatient Hospital Stay: Payer: Medicare Other

## 2019-11-02 ENCOUNTER — Other Ambulatory Visit: Payer: Self-pay

## 2019-11-02 VITALS — BP 149/69 | HR 72 | Temp 97.7°F | Resp 18

## 2019-11-02 DIAGNOSIS — C9000 Multiple myeloma not having achieved remission: Secondary | ICD-10-CM

## 2019-11-02 DIAGNOSIS — Z5111 Encounter for antineoplastic chemotherapy: Secondary | ICD-10-CM | POA: Diagnosis not present

## 2019-11-02 DIAGNOSIS — Z5112 Encounter for antineoplastic immunotherapy: Secondary | ICD-10-CM | POA: Diagnosis not present

## 2019-11-02 LAB — CBC WITH DIFFERENTIAL (CANCER CENTER ONLY)
Abs Immature Granulocytes: 0.06 10*3/uL (ref 0.00–0.07)
Basophils Absolute: 0 10*3/uL (ref 0.0–0.1)
Basophils Relative: 0 %
Eosinophils Absolute: 0 10*3/uL (ref 0.0–0.5)
Eosinophils Relative: 1 %
HCT: 28.8 % — ABNORMAL LOW (ref 36.0–46.0)
Hemoglobin: 9.5 g/dL — ABNORMAL LOW (ref 12.0–15.0)
Immature Granulocytes: 3 %
Lymphocytes Relative: 16 %
Lymphs Abs: 0.4 10*3/uL — ABNORMAL LOW (ref 0.7–4.0)
MCH: 34.3 pg — ABNORMAL HIGH (ref 26.0–34.0)
MCHC: 33 g/dL (ref 30.0–36.0)
MCV: 104 fL — ABNORMAL HIGH (ref 80.0–100.0)
Monocytes Absolute: 0.3 10*3/uL (ref 0.1–1.0)
Monocytes Relative: 13 %
Neutro Abs: 1.5 10*3/uL — ABNORMAL LOW (ref 1.7–7.7)
Neutrophils Relative %: 67 %
Platelet Count: 105 10*3/uL — ABNORMAL LOW (ref 150–400)
RBC: 2.77 MIL/uL — ABNORMAL LOW (ref 3.87–5.11)
RDW: 15.1 % (ref 11.5–15.5)
WBC Count: 2.3 10*3/uL — ABNORMAL LOW (ref 4.0–10.5)
nRBC: 0 % (ref 0.0–0.2)

## 2019-11-02 LAB — CMP (CANCER CENTER ONLY)
ALT: 10 U/L (ref 0–44)
AST: 10 U/L — ABNORMAL LOW (ref 15–41)
Albumin: 3.7 g/dL (ref 3.5–5.0)
Alkaline Phosphatase: 100 U/L (ref 38–126)
Anion gap: 6 (ref 5–15)
BUN: 8 mg/dL (ref 8–23)
CO2: 26 mmol/L (ref 22–32)
Calcium: 8.2 mg/dL — ABNORMAL LOW (ref 8.9–10.3)
Chloride: 109 mmol/L (ref 98–111)
Creatinine: 0.72 mg/dL (ref 0.44–1.00)
GFR, Est AFR Am: >60 mL/min (ref >60)
GFR, Estimated: >60 mL/min (ref >60)
Glucose, Bld: 228 mg/dL — ABNORMAL HIGH (ref 70–99)
Potassium: 3.5 mmol/L (ref 3.5–5.1)
Sodium: 141 mmol/L (ref 135–145)
Total Bilirubin: 0.4 mg/dL (ref 0.3–1.2)
Total Protein: 6.9 g/dL (ref 6.5–8.1)

## 2019-11-02 MED ORDER — DIPHENHYDRAMINE HCL 25 MG PO CAPS
ORAL_CAPSULE | ORAL | Status: AC
  Filled 2019-11-02: qty 2

## 2019-11-02 MED ORDER — MONTELUKAST SODIUM 10 MG PO TABS
10.0000 mg | ORAL_TABLET | Freq: Once | ORAL | Status: AC
  Administered 2019-11-02: 10 mg via ORAL
  Filled 2019-11-02: qty 1

## 2019-11-02 MED ORDER — DIPHENHYDRAMINE HCL 25 MG PO CAPS
50.0000 mg | ORAL_CAPSULE | Freq: Once | ORAL | Status: AC
  Administered 2019-11-02: 09:00:00 50 mg via ORAL

## 2019-11-02 MED ORDER — ACETAMINOPHEN 325 MG PO TABS
ORAL_TABLET | ORAL | Status: AC
  Filled 2019-11-02: qty 2

## 2019-11-02 MED ORDER — SODIUM CHLORIDE 0.9 % IV SOLN
Freq: Once | INTRAVENOUS | Status: AC
  Administered 2019-11-02: 09:00:00 via INTRAVENOUS
  Filled 2019-11-02: qty 250

## 2019-11-02 MED ORDER — ACETAMINOPHEN 325 MG PO TABS
650.0000 mg | ORAL_TABLET | Freq: Once | ORAL | Status: AC
  Administered 2019-11-02: 650 mg via ORAL

## 2019-11-02 MED ORDER — DEXTROSE 5 % IV SOLN
72.0000 mg/m2 | Freq: Once | INTRAVENOUS | Status: AC
  Administered 2019-11-02: 140 mg via INTRAVENOUS
  Filled 2019-11-02: qty 60

## 2019-11-02 MED ORDER — SODIUM CHLORIDE 0.9 % IV SOLN
Freq: Once | INTRAVENOUS | Status: DC
  Filled 2019-11-02: qty 250

## 2019-11-02 MED ORDER — SODIUM CHLORIDE 0.9 % IV SOLN
20.0000 mg | Freq: Once | INTRAVENOUS | Status: AC
  Administered 2019-11-02: 20 mg via INTRAVENOUS
  Filled 2019-11-02: qty 20

## 2019-11-02 MED ORDER — DARATUMUMAB-HYALURONIDASE-FIHJ 1800-30000 MG-UT/15ML ~~LOC~~ SOLN
1800.0000 mg | Freq: Once | SUBCUTANEOUS | Status: AC
  Administered 2019-11-02: 1800 mg via SUBCUTANEOUS
  Filled 2019-11-02: qty 15

## 2019-11-02 NOTE — Patient Instructions (Addendum)
Daratumumab injection What is this medicine? DARATUMUMAB (dar a toom ue mab) is a monoclonal antibody. It is used to treat multiple myeloma. This medicine may be used for other purposes; ask your health care provider or pharmacist if you have questions. COMMON BRAND NAME(S): DARZALEX What should I tell my health care provider before I take this medicine? They need to know if you have any of these conditions:  infection (especially a virus infection such as chickenpox, herpes, or hepatitis B virus)  lung or breathing disease  an unusual or allergic reaction to daratumumab, other medicines, foods, dyes, or preservatives  pregnant or trying to get pregnant  breast-feeding How should I use this medicine? This medicine is for infusion into a vein. It is given by a health care professional in a hospital or clinic setting. Talk to your pediatrician regarding the use of this medicine in children. Special care may be needed. Overdosage: If you think you have taken too much of this medicine contact a poison control center or emergency room at once. NOTE: This medicine is only for you. Do not share this medicine with others. What if I miss a dose? Keep appointments for follow-up doses as directed. It is important not to miss your dose. Call your doctor or health care professional if you are unable to keep an appointment. What may interact with this medicine? Interactions have not been studied. This list may not describe all possible interactions. Give your health care provider a list of all the medicines, herbs, non-prescription drugs, or dietary supplements you use. Also tell them if you smoke, drink alcohol, or use illegal drugs. Some items may interact with your medicine. What should I watch for while using this medicine? This drug may make you feel generally unwell. Report any side effects. Continue your course of treatment even though you feel ill unless your doctor tells you to stop. This  medicine can cause serious allergic reactions. To reduce your risk you may need to take medicine before treatment with this medicine. Take your medicine as directed. This medicine can affect the results of blood tests to match your blood type. These changes can last for up to 6 months after the final dose. Your healthcare provider will do blood tests to match your blood type before you start treatment. Tell all of your healthcare providers that you are being treated with this medicine before receiving a blood transfusion. This medicine can affect the results of some tests used to determine treatment response; extra tests may be needed to evaluate response. Do not become pregnant while taking this medicine or for 3 months after stopping it. Women should inform their doctor if they wish to become pregnant or think they might be pregnant. There is a potential for serious side effects to an unborn child. Talk to your health care professional or pharmacist for more information. What side effects may I notice from receiving this medicine? Side effects that you should report to your doctor or health care professional as soon as possible:  allergic reactions like skin rash, itching or hives, swelling of the face, lips, or tongue  breathing problems  chills  cough  dizziness  feeling faint or lightheaded  headache  low blood counts - this medicine may decrease the number of white blood cells, red blood cells and platelets. You may be at increased risk for infections and bleeding.  nausea, vomiting  shortness of breath  signs of decreased platelets or bleeding - bruising, pinpoint red spots on  the skin, black, tarry stools, blood in the urine  signs of decreased red blood cells - unusually weak or tired, feeling faint or lightheaded, falls  signs of infection - fever or chills, cough, sore throat, pain or difficulty passing urine  signs and symptoms of liver injury like dark yellow or brown  urine; general ill feeling or flu-like symptoms; light-colored stools; loss of appetite; right upper belly pain; unusually weak or tired; yellowing of the eyes or skin Side effects that usually do not require medical attention (report to your doctor or health care professional if they continue or are bothersome):  back pain  constipation  loss of appetite  diarrhea  joint pain  muscle cramps  pain, tingling, numbness in the hands or feet  swelling of the ankles, feet, hands  tiredness  trouble sleeping This list may not describe all possible side effects. Call your doctor for medical advice about side effects. You may report side effects to FDA at 1-800-FDA-1088. Where should I keep my medicine? Keep out of the reach of children. This drug is given in a hospital or clinic and will not be stored at home. NOTE: This sheet is a summary. It may not cover all possible information. If you have questions about this medicine, talk to your doctor, pharmacist, or health care provider.  2020 Elsevier/Gold Standard (2018-09-23 14:00:48) Montelukast oral tablets What is this medicine? MONTELUKAST (mon te LOO kast) is used to prevent and treat the symptoms of asthma. It is also used to treat allergies. Do not use for an acute asthma attack. This medicine may be used for other purposes; ask your health care provider or pharmacist if you have questions. COMMON BRAND NAME(S): Singulair What should I tell my health care provider before I take this medicine? They need to know if you have any of these conditions:  liver disease  an unusual or allergic reaction to montelukast, other medicines, foods, dyes, or preservatives  pregnant or trying to get pregnant  breast-feeding How should I use this medicine? This medicine should be given by mouth. Follow the directions on the prescription label. Take this medicine at the same time every day. You may take this medicine with or without meals. Do  not chew the tablets. Do not stop taking your medicine unless your doctor tells you to. Talk to your pediatrician regarding the use of this medicine in children. Special care may be needed. While this drug may be prescribed for children as young as 51 years of age for selected conditions, precautions do apply. Overdosage: If you think you have taken too much of this medicine contact a poison control center or emergency room at once. NOTE: This medicine is only for you. Do not share this medicine with others. What if I miss a dose? If you miss a dose, skip it. Take your next dose at the normal time. Do not take extra or 2 doses at the same time to make up for the missed dose. What may interact with this medicine?  anti-infectives like rifampin and rifabutin  medicines for seizures like phenytoin, phenobarbital, and carbamazepine This list may not describe all possible interactions. Give your health care provider a list of all the medicines, herbs, non-prescription drugs, or dietary supplements you use. Also tell them if you smoke, drink alcohol, or use illegal drugs. Some items may interact with your medicine. What should I watch for while using this medicine? Visit your doctor or health care professional for regular checks on your  progress. Tell your doctor or health care professional if your allergy or asthma symptoms do not improve. Take your medicine even when you do not have symptoms. Do not stop taking any of your medicine(s) unless your doctor tells you to. If you have asthma, talk to your doctor about what to do in an acute asthma attack. Always have your inhaled rescue medicine for asthma attacks with you. Patients and their families should watch for new or worsening thoughts of suicide or depression. Also watch for sudden changes in feelings such as feeling anxious, agitated, panicky, irritable, hostile, aggressive, impulsive, severely restless, overly excited and hyperactive, or not being  able to sleep. Any worsening of mood or thoughts of suicide or dying should be reported to your health care professional right away. What side effects may I notice from receiving this medicine? Side effects that you should report to your doctor or health care professional as soon as possible:  allergic reactions like skin rash or hives, or swelling of the face, lips, or tongue  breathing problems  changes in emotions or moods  confusion  depressed mood  fever or infection  hallucinations  joint pain  painful lumps under the skin  pain, tingling, numbness in the hands or feet  redness, blistering, peeling, or loosening of the skin, including inside the mouth  restlessness  seizures  sleep walking  signs and symptoms of infection like fever; chills; cough; sore throat; flu-like illness  signs and symptoms of liver injury like dark yellow or brown urine; general ill feeling or flu-like symptoms; light-colored stools; loss of appetite; nausea; right upper belly pain; unusually weak or tired; yellowing of the eyes or skin  sinus pain or swelling  stuttering  suicidal thoughts or other mood changes  tremors  trouble sleeping  uncontrolled muscle movements  unusual bleeding or bruising  vivid or bad dreams Side effects that usually do not require medical attention (report to your doctor or health care professional if they continue or are bothersome):  dizziness  drowsiness  headache  runny nose  stomach upset  tiredness This list may not describe all possible side effects. Call your doctor for medical advice about side effects. You may report side effects to FDA at 1-800-FDA-1088. Where should I keep my medicine? Keep out of the reach of children. Store at room temperature between 15 and 30 degrees C (59 and 86 degrees F). Protect from light and moisture. Keep this medicine in the original bottle. Throw away any unused medicine after the expiration  date. NOTE: This sheet is a summary. It may not cover all possible information. If you have questions about this medicine, talk to your doctor, pharmacist, or health care provider.  2020 Else[employer]/Gold Standard (2019-04-08 12:54:33) Acetaminophen tablets or caplets What is this medicine? ACETAMINOPHEN (a set a MEE noe fen) is a pain reliever. It is used to treat mild pain and fever. This medicine may be used for other purposes; ask your health care provider or pharmacist if you have questions. COMMON BRAND NAME(S): Aceta, Actamin, Anacin Aspirin Free, Genapap, Genebs, Mapap, Pain & Fever, Pain and Fever, PAIN RELIEF, PAIN RELIEF Extra Strength, Pain Reliever, Panadol, PHARBETOL, Q-Pap, Q-Pap Extra Strength, Tylenol, Tylenol CrushableTablet, Tylenol Extra Strength, XS No Aspirin, XS Pain Reliever What should I tell my health care provider before I take this medicine? They need to know if you have any of these conditions:  if you often drink alcohol  liver disease  an unusual or allergic reaction to  acetaminophen, other medicines, foods, dyes, or preservatives  pregnant or trying to get pregnant  breast-feeding How should I use this medicine? Take this medicine by mouth with a glass of water. Follow the directions on the package or prescription label. Take your medicine at regular intervals. Do not take your medicine more often than directed. Talk to your pediatrician regarding the use of this medicine in children. While this drug may be prescribed for children as young as 69 years of age for selected conditions, precautions do apply. Overdosage: If you think you have taken too much of this medicine contact a poison control center or emergency room at once. NOTE: This medicine is only for you. Do not share this medicine with others. What if I miss a dose? If you miss a dose, take it as soon as you can. If it is almost time for your next dose, take only that dose. Do not take double or extra  doses. What may interact with this medicine?  alcohol  imatinib  isoniazid  other medicines with acetaminophen This list may not describe all possible interactions. Give your health care provider a list of all the medicines, herbs, non-prescription drugs, or dietary supplements you use. Also tell them if you smoke, drink alcohol, or use illegal drugs. Some items may interact with your medicine. What should I watch for while using this medicine? Tell your doctor or health care professional if the pain lasts more than 10 days (5 days for children), if it gets worse, or if there is a new or different kind of pain. Also, check with your doctor if a fever lasts for more than 3 days. Do not take other medicines that contain acetaminophen with this medicine. Always read labels carefully. If you have questions, ask your doctor or pharmacist. If you take too much acetaminophen get medical help right away. Too much acetaminophen can be very dangerous and cause liver damage. Even if you do not have symptoms, it is important to get help right away. What side effects may I notice from receiving this medicine? Side effects that you should report to your doctor or health care professional as soon as possible:  allergic reactions like skin rash, itching or hives, swelling of the face, lips, or tongue  breathing problems  fever or sore throat  redness, blistering, peeling or loosening of the skin, including inside the mouth  trouble passing urine or change in the amount of urine  unusual bleeding or bruising  unusually weak or tired  yellowing of the eyes or skin Side effects that usually do not require medical attention (report to your doctor or health care professional if they continue or are bothersome):  headache  nausea, stomach upset This list may not describe all possible side effects. Call your doctor for medical advice about side effects. You may report side effects to FDA at  1-800-FDA-1088. Where should I keep my medicine? Keep out of reach of children. Store at room temperature between 20 and 25 degrees C (68 and 77 degrees F). Protect from moisture and heat. Throw away any unused medicine after the expiration date. NOTE: This sheet is a summary. It may not cover all possible information. If you have questions about this medicine, talk to your doctor, pharmacist, or health care provider.  2020 Elsevier/Gold Standard (2013-08-01 12:54:16) Diphenhydramine capsules or tablets What is this medicine? DIPHENHYDRAMINE (dye fen HYE dra meen) is an antihistamine. It is used to treat the symptoms of an allergic reaction. It is  also used to treat Parkinson's disease. This medicine is also used to prevent and to treat motion sickness and as a nighttime sleep aid. This medicine may be used for other purposes; ask your health care provider or pharmacist if you have questions. COMMON BRAND NAME(S): Alka-Seltzer Plus Allergy, Aller-G-Time, Banophen, Benadryl Allergy, Benadryl Allergy Dye Free, Benadryl Allergy Kapgel, Benadryl Allergy Ultratab, Diphedryl, Diphenhist, Genahist, Geri-Dryl, PHARBEDRYL, Q-Dryl, Gretta Began, Valu-Dryl, Vicks ZzzQuil Nightime Sleep-Aid What should I tell my health care provider before I take this medicine? They need to know if you have any of these conditions:  asthma or lung disease  glaucoma  high blood pressure or heart disease  liver disease  pain or difficulty passing urine  prostate trouble  ulcers or other stomach problems  an unusual or allergic reaction to diphenhydramine, other medicines foods, dyes, or preservatives such as sulfites  pregnant or trying to get pregnant  breast-feeding How should I use this medicine? Take this medicine by mouth with a full glass of water. Follow the directions on the prescription label. Take your doses at regular intervals. Do not take your medicine more often than directed. To prevent motion  sickness start taking this medicine 30 to 60 minutes before you leave. Talk to your pediatrician regarding the use of this medicine in children. Special care may be needed. Patients over 91 years old may have a stronger reaction and need a smaller dose. Overdosage: If you think you have taken too much of this medicine contact a poison control center or emergency room at once. NOTE: This medicine is only for you. Do not share this medicine with others. What if I miss a dose? If you miss a dose, take it as soon as you can. If it is almost time for your next dose, take only that dose. Do not take double or extra doses. What may interact with this medicine? Do not take this medicine with any of the following medications:  MAOIs like Carbex, Eldepryl, Marplan, Nardil, and Parnate This medicine may also interact with the following medications:  alcohol  barbiturates, like phenobarbital  medicines for bladder spasm like oxybutynin, tolterodine  medicines for blood pressure  medicines for depression, anxiety, or psychotic disturbances  medicines for movement abnormalities or Parkinson's disease  medicines for sleep  other medicines for cold, cough or allergy  some medicines for the stomach like chlordiazepoxide, dicyclomine This list may not describe all possible interactions. Give your health care provider a list of all the medicines, herbs, non-prescription drugs, or dietary supplements you use. Also tell them if you smoke, drink alcohol, or use illegal drugs. Some items may interact with your medicine. What should I watch for while using this medicine? Visit your doctor or health care professional for regular check ups. Tell your doctor if your symptoms do not improve or if they get worse. Your mouth may get dry. Chewing sugarless gum or sucking hard candy, and drinking plenty of water may help. Contact your doctor if the problem does not go away or is severe. This medicine may cause dry  eyes and blurred vision. If you wear contact lenses you may feel some discomfort. Lubricating drops may help. See your eye doctor if the problem does not go away or is severe. You may get drowsy or dizzy. Do not drive, use machinery, or do anything that needs mental alertness until you know how this medicine affects you. Do not stand or sit up quickly, especially if you are an older patient. This  reduces the risk of dizzy or fainting spells. Alcohol may interfere with the effect of this medicine. Avoid alcoholic drinks. What side effects may I notice from receiving this medicine? Side effects that you should report to your doctor or health care professional as soon as possible:  allergic reactions like skin rash, itching or hives, swelling of the face, lips, or tongue  changes in vision  confused, agitated, nervous  irregular or fast heartbeat  tremor  trouble passing urine  unusual bleeding or bruising  unusually weak or tired Side effects that usually do not require medical attention (report to your doctor or health care professional if they continue or are bothersome):  constipation, diarrhea  drowsy  headache  loss of appetite  stomach upset, vomiting  thick mucous This list may not describe all possible side effects. Call your doctor for medical advice about side effects. You may report side effects to FDA at 1-800-FDA-1088. Where should I keep my medicine? Keep out of the reach of children. Store at room temperature between 15 and 30 degrees C (59 and 86 degrees F). Keep container closed tightly. Throw away any unused medicine after the expiration date. NOTE: This sheet is a summary. It may not cover all possible information. If you have questions about this medicine, talk to your doctor, pharmacist, or health care provider.  2020 [employer]/Gold Standard (2008-03-27 17:06:22) Dexamethasone injection What is this medicine? DEXAMETHASONE (dex a METH a sone) is a  corticosteroid. It is used to treat inflammation of the skin, joints, lungs, and other organs. Common conditions treated include asthma, allergies, and arthritis. It is also used for other conditions, like blood disorders and diseases of the adrenal glands. This medicine may be used for other purposes; ask your health care provider or pharmacist if you have questions. COMMON BRAND NAME(S): Decadron, DoubleDex, Simplist Dexamethasone, Solurex What should I tell my health care provider before I take this medicine? They need to know if you have any of these conditions:  blood clotting problems  Cushing's syndrome  diabetes  glaucoma  heart problems or disease  high blood pressure  infection like herpes, measles, tuberculosis, or chickenpox  kidney disease  liver disease  mental problems  myasthenia gravis  osteoporosis  previous heart attack  seizures  stomach, ulcer or intestine disease including colitis and diverticulitis  thyroid problem  an unusual or allergic reaction to dexamethasone, corticosteroids, other medicines, lactose, foods, dyes, or preservatives  pregnant or trying to get pregnant  breast-feeding How should I use this medicine? This medicine is for injection into a muscle, joint, lesion, soft tissue, or vein. It is given by a health care professional in a hospital or clinic setting. Talk to your pediatrician regarding the use of this medicine in children. Special care may be needed. Overdosage: If you think you have taken too much of this medicine contact a poison control center or emergency room at once. NOTE: This medicine is only for you. Do not share this medicine with others. What if I miss a dose? This may not apply. If you are having a series of injections over a prolonged period, try not to miss an appointment. Call your doctor or health care professional to reschedule if you are unable to keep an appointment. What may interact with this  medicine? Do not take this medicine with any of the following medications:  mifepristone, RU-486  vaccines This medicine may also interact with the following medications:  amphotericin B  antibiotics like clarithromycin, erythromycin, and  troleandomycin  aspirin and aspirin-like drugs  barbiturates like phenobarbital  carbamazepine  cholestyramine  cholinesterase inhibitors like donepezil, galantamine, rivastigmine, and tacrine  cyclosporine  digoxin  diuretics  ephedrine  female hormones, like estrogens or progestins and birth control pills  indinavir  isoniazid  ketoconazole  medicines for diabetes  medicines that improve muscle tone or strength for conditions like myasthenia gravis  NSAIDs, medicines for pain and inflammation, like ibuprofen or naproxen  phenytoin  rifampin  thalidomide  warfarin This list may not describe all possible interactions. Give your health care provider a list of all the medicines, herbs, non-prescription drugs, or dietary supplements you use. Also tell them if you smoke, drink alcohol, or use illegal drugs. Some items may interact with your medicine. What should I watch for while using this medicine? Your condition will be monitored carefully while you are receiving this medicine. If you are taking this medicine for a long time, carry an identification card with your name and address, the type and dose of your medicine, and your doctor's name and address. This medicine may increase your risk of getting an infection. Stay away from people who are sick. Tell your doctor or health care professional if you are around anyone with measles or chickenpox. Talk to your health care provider before you get any vaccines that you take this medicine. If you are going to have surgery, tell your doctor or health care professional that you have taken this medicine within the last twelve months. Ask your doctor or health care professional about  your diet. You may need to lower the amount of salt you eat. This medicine may increase blood sugar. Ask your healthcare provider if changes in diet or medicines are needed if you have diabetes. What side effects may I notice from receiving this medicine? Side effects that you should report to your doctor or health care professional as soon as possible:  allergic reactions like skin rash, itching or hives, swelling of the face, lips, or tongue  black or tarry stools  change in the amount of urine  confusion, excitement, restlessness, a false sense of well-being  fever, sore throat, sneezing, cough, or other signs of infection, wounds that will not heal  hallucinations  mental depression, mood swings, mistaken feelings of self importance or of being mistreated  pain in hips, back, ribs, arms, shoulders, or legs  pain, redness, or irritation at the injection site  redness, blistering, peeling or loosening of the skin, including inside the mouth  rounding out of face   signs and symptoms of high blood sugar such as being more thirsty or hungry or having to urinate more than normal. You may also feel very tired or have blurry vision.  swelling of feet or lower legs  unusual bleeding or bruising  wounds that do not heal Side effects that usually do not require medical attention (report to your doctor or health care professional if they continue or are bothersome):  diarrhea or constipation  change in taste  headache  nausea, vomiting  skin problems, acne, thin and shiny skin  trouble sleeping  unusual growth of hair on the face or body  weight gain This list may not describe all possible side effects. Call your doctor for medical advice about side effects. You may report side effects to FDA at 1-800-FDA-1088. Where should I keep my medicine? This drug is given in a hospital or clinic and will not be stored at home. NOTE: This sheet is a summary.  It may not cover all  possible information. If you have questions about this medicine, talk to your doctor, pharmacist, or health care provider.  2020 [employer] Standard (2018-09-08 10:35:53) Daratumumab injection What is this medicine? DARATUMUMAB (dar a toom ue mab) is a monoclonal antibody. It is used to treat multiple myeloma. This medicine may be used for other purposes; ask your health care provider or pharmacist if you have questions. COMMON BRAND NAME(S): DARZALEX What should I tell my health care provider before I take this medicine? They need to know if you have any of these conditions:  infection (especially a virus infection such as chickenpox, herpes, or hepatitis B virus)  lung or breathing disease  an unusual or allergic reaction to daratumumab, other medicines, foods, dyes, or preservatives  pregnant or trying to get pregnant  breast-feeding How should I use this medicine? This medicine is for infusion into a vein. It is given by a health care professional in a hospital or clinic setting. Talk to your pediatrician regarding the use of this medicine in children. Special care may be needed. Overdosage: If you think you have taken too much of this medicine contact a poison control center or emergency room at once. NOTE: This medicine is only for you. Do not share this medicine with others. What if I miss a dose? Keep appointments for follow-up doses as directed. It is important not to miss your dose. Call your doctor or health care professional if you are unable to keep an appointment. What may interact with this medicine? Interactions have not been studied. This list may not describe all possible interactions. Give your health care provider a list of all the medicines, herbs, non-prescription drugs, or dietary supplements you use. Also tell them if you smoke, drink alcohol, or use illegal drugs. Some items may interact with your medicine. What should I watch for while using this  medicine? This drug may make you feel generally unwell. Report any side effects. Continue your course of treatment even though you feel ill unless your doctor tells you to stop. This medicine can cause serious allergic reactions. To reduce your risk you may need to take medicine before treatment with this medicine. Take your medicine as directed. This medicine can affect the results of blood tests to match your blood type. These changes can last for up to 6 months after the final dose. Your healthcare provider will do blood tests to match your blood type before you start treatment. Tell all of your healthcare providers that you are being treated with this medicine before receiving a blood transfusion. This medicine can affect the results of some tests used to determine treatment response; extra tests may be needed to evaluate response. Do not become pregnant while taking this medicine or for 3 months after stopping it. Women should inform their doctor if they wish to become pregnant or think they might be pregnant. There is a potential for serious side effects to an unborn child. Talk to your health care professional or pharmacist for more information. What side effects may I notice from receiving this medicine? Side effects that you should report to your doctor or health care professional as soon as possible:  allergic reactions like skin rash, itching or hives, swelling of the face, lips, or tongue  breathing problems  chills  cough  dizziness  feeling faint or lightheaded  headache  low blood counts - this medicine may decrease the number of white blood cells, red blood cells and platelets.  You may be at increased risk for infections and bleeding.  nausea, vomiting  shortness of breath  signs of decreased platelets or bleeding - bruising, pinpoint red spots on the skin, black, tarry stools, blood in the urine  signs of decreased red blood cells - unusually weak or tired, feeling  faint or lightheaded, falls  signs of infection - fever or chills, cough, sore throat, pain or difficulty passing urine  signs and symptoms of liver injury like dark yellow or brown urine; general ill feeling or flu-like symptoms; light-colored stools; loss of appetite; right upper belly pain; unusually weak or tired; yellowing of the eyes or skin Side effects that usually do not require medical attention (report to your doctor or health care professional if they continue or are bothersome):  back pain  constipation  loss of appetite  diarrhea  joint pain  muscle cramps  pain, tingling, numbness in the hands or feet  swelling of the ankles, feet, hands  tiredness  trouble sleeping This list may not describe all possible side effects. Call your doctor for medical advice about side effects. You may report side effects to FDA at 1-800-FDA-1088. Where should I keep my medicine? Keep out of the reach of children. This drug is given in a hospital or clinic and will not be stored at home. NOTE: This sheet is a summary. It may not cover all possible information. If you have questions about this medicine, talk to your doctor, pharmacist, or health care provider.  2020 [employer] Standard (2018-09-23 14:00:48) Carfilzomib injection What is this medicine? CARFILZOMIB (kar FILZ oh mib) targets a specific protein within cancer cells and stops the cancer cells from growing. It is used to treat multiple myeloma. This medicine may be used for other purposes; ask your health care provider or pharmacist if you have questions. COMMON BRAND NAME(S): KYPROLIS What should I tell my health care provider before I take this medicine? They need to know if you have any of these conditions:  heart disease  history of blood clots  irregular heartbeat  kidney disease  liver disease  lung or breathing disease  an unusual or allergic reaction to carfilzomib, or other medicines, foods, dyes,  or preservatives  pregnant or trying to get pregnant  breast-feeding How should I use this medicine? This medicine is for injection or infusion into a vein. It is given by a health care professional in a hospital or clinic setting. Talk to your pediatrician regarding the use of this medicine in children. Special care may be needed. Overdosage: If you think you have taken too much of this medicine contact a poison control center or emergency room at once. NOTE: This medicine is only for you. Do not share this medicine with others. What if I miss a dose? It is important not to miss your dose. Call your doctor or health care professional if you are unable to keep an appointment. What may interact with this medicine? Interactions are not expected. Give your health care provider a list of all the medicines, herbs, non-prescription drugs, or dietary supplements you use. Also tell them if you smoke, drink alcohol, or use illegal drugs. Some items may interact with your medicine. This list may not describe all possible interactions. Give your health care provider a list of all the medicines, herbs, non-prescription drugs, or dietary supplements you use. Also tell them if you smoke, drink alcohol, or use illegal drugs. Some items may interact with your medicine. What should I watch for  while using this medicine? Your condition will be monitored carefully while you are receiving this medicine. Report any side effects. Continue your course of treatment even though you feel ill unless your doctor tells you to stop. You may need blood work done while you are taking this medicine. Do not become pregnant while taking this medicine or for at least 6 months after stopping it. Women should inform their doctor if they wish to become pregnant or think they might be pregnant. There is a potential for serious side effects to an unborn child. Men should not father a child while taking this medicine and for at least 3  months after stopping it. Talk to your health care professional or pharmacist for more information. Do not breast-feed an infant while taking this medicine or for 2 weeks after the last dose. Check with your doctor or health care professional if you get an attack of severe diarrhea, nausea and vomiting, or if you sweat a lot. The loss of too much body fluid can make it dangerous for you to take this medicine. You may get dizzy. Do not drive, use machinery, or do anything that needs mental alertness until you know how this medicine affects you. Do not stand or sit up quickly, especially if you are an older patient. This reduces the risk of dizzy or fainting spells. What side effects may I notice from receiving this medicine? Side effects that you should report to your doctor or health care professional as soon as possible:  allergic reactions like skin rash, itching or hives, swelling of the face, lips, or tongue  confusion  dizziness  feeling faint or lightheaded  fever or chills  palpitations  seizures  signs and symptoms of bleeding such as bloody or black, tarry stools; red or dark-brown urine; spitting up blood or brown material that looks like coffee grounds; red spots on the skin; unusual bruising or bleeding including from the eye, gums, or nose  signs and symptoms of a blood clot such as breathing problems; changes in vision; chest pain; severe, sudden headache; pain, swelling, warmth in the leg; trouble speaking; sudden numbness or weakness of the face, arm or leg  signs and symptoms of kidney injury like trouble passing urine or change in the amount of urine  signs and symptoms of liver injury like dark yellow or brown urine; general ill feeling or flu-like symptoms; light-colored stools; loss of appetite; nausea; right upper belly pain; unusually weak or tired; yellowing of the eyes or skin Side effects that usually do not require medical attention (report to your doctor or  health care professional if they continue or are bothersome):  back pain  cough  diarrhea  headache  muscle cramps  vomiting This list may not describe all possible side effects. Call your doctor for medical advice about side effects. You may report side effects to FDA at 1-800-FDA-1088. Where should I keep my medicine? This drug is given in a hospital or clinic and will not be stored at home. NOTE: This sheet is a summary. It may not cover all possible information. If you have questions about this medicine, talk to your doctor, pharmacist, or health care provider.  2020 [employer] Standard (2017-09-23 14:07:13)

## 2019-11-02 NOTE — Progress Notes (Signed)
CBC and CMET results reviewed by Dr. [name].  OK to treat today per order of Dr. [name].

## 2019-11-03 ENCOUNTER — Emergency Department (HOSPITAL_COMMUNITY): Payer: Medicare Other

## 2019-11-03 ENCOUNTER — Other Ambulatory Visit: Payer: Self-pay

## 2019-11-03 ENCOUNTER — Encounter (HOSPITAL_COMMUNITY): Payer: Self-pay

## 2019-11-03 ENCOUNTER — Emergency Department (HOSPITAL_COMMUNITY)
Admission: EM | Admit: 2019-11-03 | Discharge: 2019-11-03 | Disposition: A | Discharge status: Home / Self Care | Payer: Medicare Other | Attending: Emergency Medicine | Admitting: Emergency Medicine

## 2019-11-03 ENCOUNTER — Telehealth: Payer: Self-pay | Admitting: *Deleted

## 2019-11-03 DIAGNOSIS — R519 Headache, unspecified: Secondary | ICD-10-CM | POA: Insufficient documentation

## 2019-11-03 DIAGNOSIS — Z794 Long term (current) use of insulin: Secondary | ICD-10-CM | POA: Insufficient documentation

## 2019-11-03 DIAGNOSIS — R9431 Abnormal electrocardiogram [ECG] [EKG]: Secondary | ICD-10-CM | POA: Diagnosis not present

## 2019-11-03 DIAGNOSIS — R202 Paresthesia of skin: Secondary | ICD-10-CM | POA: Diagnosis not present

## 2019-11-03 DIAGNOSIS — E119 Type 2 diabetes mellitus without complications: Secondary | ICD-10-CM | POA: Diagnosis not present

## 2019-11-03 DIAGNOSIS — D61818 Other pancytopenia: Secondary | ICD-10-CM | POA: Diagnosis not present

## 2019-11-03 DIAGNOSIS — R0602 Shortness of breath: Secondary | ICD-10-CM | POA: Diagnosis not present

## 2019-11-03 DIAGNOSIS — Z79899 Other long term (current) drug therapy: Secondary | ICD-10-CM | POA: Insufficient documentation

## 2019-11-03 DIAGNOSIS — I10 Essential (primary) hypertension: Secondary | ICD-10-CM | POA: Insufficient documentation

## 2019-11-03 DIAGNOSIS — C9000 Multiple myeloma not having achieved remission: Secondary | ICD-10-CM

## 2019-11-03 DIAGNOSIS — Z87891 Personal history of nicotine dependence: Secondary | ICD-10-CM | POA: Diagnosis not present

## 2019-11-03 DIAGNOSIS — R200 Anesthesia of skin: Secondary | ICD-10-CM

## 2019-11-03 LAB — CBC WITH DIFFERENTIAL/PLATELET
Abs Immature Granulocytes: 0.08 10*3/uL — ABNORMAL HIGH (ref 0.00–0.07)
Basophils Absolute: 0 10*3/uL (ref 0.0–0.1)
Basophils Relative: 0 %
Eosinophils Absolute: 0 10*3/uL (ref 0.0–0.5)
Eosinophils Relative: 0 %
HCT: 27.4 % — ABNORMAL LOW (ref 36.0–46.0)
Hemoglobin: 8.8 g/dL — ABNORMAL LOW (ref 12.0–15.0)
Immature Granulocytes: 2 %
Lymphocytes Relative: 2 %
Lymphs Abs: 0.1 10*3/uL — ABNORMAL LOW (ref 0.7–4.0)
MCH: 33.7 pg (ref 26.0–34.0)
MCHC: 32.1 g/dL (ref 30.0–36.0)
MCV: 105 fL — ABNORMAL HIGH (ref 80.0–100.0)
Monocytes Absolute: 0.4 10*3/uL (ref 0.1–1.0)
Monocytes Relative: 9 %
Neutro Abs: 3.3 10*3/uL (ref 1.7–7.7)
Neutrophils Relative %: 87 %
Platelets: 76 10*3/uL — ABNORMAL LOW (ref 150–400)
RBC: 2.61 MIL/uL — ABNORMAL LOW (ref 3.87–5.11)
RDW: 15.8 % — ABNORMAL HIGH (ref 11.5–15.5)
WBC: 3.8 10*3/uL — ABNORMAL LOW (ref 4.0–10.5)
nRBC: 0.5 % — ABNORMAL HIGH (ref 0.0–0.2)

## 2019-11-03 LAB — COMPREHENSIVE METABOLIC PANEL
ALT: 16 U/L (ref 0–44)
AST: 62 U/L — ABNORMAL HIGH (ref 15–41)
Albumin: 3 g/dL — ABNORMAL LOW (ref 3.5–5.0)
Alkaline Phosphatase: 83 U/L (ref 38–126)
Anion gap: 13 (ref 5–15)
BUN: 24 mg/dL — ABNORMAL HIGH (ref 8–23)
CO2: 20 mmol/L — ABNORMAL LOW (ref 22–32)
Calcium: 7.8 mg/dL — ABNORMAL LOW (ref 8.9–10.3)
Chloride: 107 mmol/L (ref 98–111)
Creatinine, Ser: 1.13 mg/dL — ABNORMAL HIGH (ref 0.44–1.00)
GFR calc Af Amer: 57 mL/min — ABNORMAL LOW (ref >60)
GFR calc non Af Amer: 49 mL/min — ABNORMAL LOW (ref >60)
Glucose, Bld: 257 mg/dL — ABNORMAL HIGH (ref 70–99)
Potassium: 3.2 mmol/L — ABNORMAL LOW (ref 3.5–5.1)
Sodium: 140 mmol/L (ref 135–145)
Total Bilirubin: 0.5 mg/dL (ref 0.3–1.2)
Total Protein: 6.7 g/dL (ref 6.5–8.1)

## 2019-11-03 LAB — URINALYSIS, ROUTINE W REFLEX MICROSCOPIC
Bilirubin Urine: NEGATIVE
Glucose, UA: NEGATIVE mg/dL
Hgb urine dipstick: NEGATIVE
Ketones, ur: NEGATIVE mg/dL
Leukocytes,Ua: NEGATIVE
Nitrite: NEGATIVE
Protein, ur: NEGATIVE mg/dL
Specific Gravity, Urine: 1.018 (ref 1.005–1.030)
pH: 5 (ref 5.0–8.0)

## 2019-11-03 LAB — TROPONIN I (HIGH SENSITIVITY)
Troponin I (High Sensitivity): 8 ng/L (ref <18)
Troponin I (High Sensitivity): 14 ng/L (ref <18)

## 2019-11-03 LAB — CBG MONITORING, ED: Glucose-Capillary: 250 mg/dL — ABNORMAL HIGH (ref 70–99)

## 2019-11-03 MED ORDER — ACETAMINOPHEN 500 MG PO TABS
1000.0000 mg | ORAL_TABLET | Freq: Once | ORAL | Status: AC
  Administered 2019-11-03: 1000 mg via ORAL
  Filled 2019-11-03: qty 2

## 2019-11-03 MED ORDER — DIPHENHYDRAMINE HCL 50 MG/ML IJ SOLN
25.0000 mg | Freq: Once | INTRAMUSCULAR | Status: AC
  Administered 2019-11-03: 25 mg via INTRAVENOUS
  Filled 2019-11-03: qty 1

## 2019-11-03 MED ORDER — METOCLOPRAMIDE HCL 5 MG/ML IJ SOLN
10.0000 mg | Freq: Once | INTRAMUSCULAR | Status: AC
  Administered 2019-11-03: 10 mg via INTRAVENOUS
  Filled 2019-11-03: qty 2

## 2019-11-03 NOTE — ED Provider Notes (Signed)
[facility] EMERGENCY DEPARTMENT Provider Note   CSN: [id number] Arrival date & time: 11/03/19  3016     History   Chief Complaint Chief Complaint  Patient presents with   hand numbness    HPI Vicki Moon is a 68 y.o. female.     Pt presents to the ED today with right hand numbness.  Pt had chemo yesterday and woke up around midnight with a severe headache.  The headache didn't last long, but she then developed numbness in her right hand.  The pt said she thought it would go away, but it has not.  She also has sob.  Her multiple myeloma was not responding to chemo, so her oncologist changed the protocol.  Last week was the first dose of the new chemo. Yesterday was her 2nd dose.  Pt denies any f/c.  No cough.  No other neuro sx.  She was able to eat this morning and feels like she is speaking normally.     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   Disseminated MRSA infection 05/24/2019   Diabetes mellitus without complication ([place]) 12/30/3233   Multiple Skin abscessed due to MRSA 05/24/2019   Pancytopenia ([place]) 05/24/2019   Multiple myeloma ([place]) 04/29/2019   Goals of care, counseling/discussion 04/29/2019   Pain in abdominal muscle of right flank 03/08/2019   Abscess of right axilla    Cellulitis of axilla, right    Cellulitis 12/17/2018   AKI (acute kidney injury) ([place]) 12/17/2018   Anemia 12/17/2018   Type 2 diabetes mellitus ([place]) 12/17/2018   Depression 12/17/2018   Trochanteric bursitis, right hip 08/12/2018   Post-menopausal 01/19/2018   Current smoker 01/19/2018   Mixed  hyperlipidemia 07/13/2017   Class 2 severe obesity due to excess calories with serious comorbidity and body mass index (BMI) of 37.0 to 37.9 in adult ([place]) 07/13/2017   Hypercortisolemia 01/20/2017   Excessive weight gain 01/20/2017   Generalized abdominal pain 01/20/2017   Uncontrolled type 2 diabetes mellitus with complication, with long-term current use of insulin ([place]) 12/20/2015   Essential hypertension, benign 12/20/2015   Vitamin D deficiency 12/20/2015   Rectal bleeding 05/29/2015   HNP (herniated nucleus pulposus), lumbar 07/12/2014    Class: Diagnosis of    Past Surgical History:  Procedure Laterality Date   CHOLECYSTECTOMY     COLONOSCOPY N/A 06/28/2015   Procedure: COLONOSCOPY;  Surgeon: Rogene Houston, MD;  Location: [facility];  Service: Endoscopy;  Laterality: N/A;  155   EYE SURGERY     IR BONE TUMOR(S)RF ABLATION  04/28/2019   IR KYPHO LUMBAR INC FX REDUCE BONE BX UNI/BIL CANNULATION INC/IMAGING  04/28/2019   IR RADIOLOGIST EVAL & MGMT  04/22/2019   LUMBAR LAMINECTOMY Right 07/12/2014   Procedure: LUMBAR FIVE TO SACRAL ONE MICRODISCECTOMY;  Surgeon: Marybelle Killings, MD;  Location: [place];  Service: Orthopedics;  Laterality: Right;   TUBAL LIGATION     VITRECTOMY 25 GAUGE WITH SCLERAL BUCKLE Left 07/29/2017   Procedure: RETINAL DETACHMENT REPAIR LEFT EYE WITH PARSPLANA VITRECTOMY, AIR FLUID EXCHANGE, ENDO LASTER, DRAINAGE OF SUBRETINAL  PPV /25 GAUGE;  Surgeon: Patel, Narendra, MD;  Location: [facility] OR;  Service: Ophthalmology;  Laterality: Left;  °  ° °OB History   °No obstetric history on file. °  ° ° ° °Home Medications   ° °Prior to Admission medications   °Medication Sig Start Date End Date Taking? Authorizing Provider  °ALPRAZolam (XANAX) 0.5 MG tablet Take 0.5 mg by mouth at bedtime as needed.  05/10/19  Yes [provider]  °atorvastatin (LIPITOR) 10 MG tablet Take 10 mg by mouth daily. 08/17/18  Yes [provider]  °Continuous  Blood Gluc Sensor (FREESTYLE LIBRE SENSOR SYSTEM) MISC Use one sensor every 10 days. 10/14/17  Yes Nida, Gebreselassie W, MD  °cyclobenzaprine (FLEXERIL) 5 MG tablet Take 1 tablet (5 mg total) by mouth 3 (three) times daily as needed. 09/03/19  Yes Knapp, Iva, MD  °glipiZIDE (GLUCOTROL XL) 10 MG 24 hr tablet Take 10 mg by mouth daily.  04/04/19  Yes [provider]  °HYDROcodone-acetaminophen (NORCO/VICODIN) 5-325 MG tablet Take 1 tablet by mouth every 6 (six) hours as needed for moderate pain. Received from [place] ED.   Yes [provider]  °Insulin Disposable Pump (OMNIPOD DASH SYSTEM) KIT 1 Device by Does not apply route 3 (three) times a week. Patient put on insulin pump every 2 to 3 days for insulin delivery   Yes [provider]  °latanoprost (XALATAN) 0.005 % ophthalmic solution Place 1 drop into both eyes at bedtime.   Yes [provider]  °lisinopril (ZESTRIL) 10 MG tablet Take 10 mg by mouth daily. 10/17/19  Yes [provider]  °PARoxetine (PAXIL) 30 MG tablet Take 30 mg by mouth every morning. 07/12/18  Yes [provider]  °potassium chloride SA (K-DUR) 20 MEQ tablet Take 2 tablets (40 mEq total) by mouth daily. 07/19/19  Yes Ennever, Peter R, MD  °NOVOLOG 100 UNIT/ML injection Inject 1.2-25 Units into the skin as directed. PATIENT USES INSULIN PUMP ° °Patient has a set basal rate of 1.2-1.5u per hour with meal time boluses of up to 25 units 05/06/19   [provider]  °PRESCRIPTION MEDICATION Onmipod dash-insulin pump.Changes every 2 days. 5-25units ac based on blood sugar.    [provider]  °prochlorperazine (COMPAZINE) 10 MG tablet Take 1 tablet (10 mg total) by mouth every 6 (six) hours as needed (Nausea or vomiting). 05/17/19 06/29/19  Ennever, Peter R, MD  ° ° °Family History °Family History  °Problem Relation Age of Onset  °• Hypertension Mother   °• Stroke Mother   °• Cancer Father   °     brain tumor  °• Diabetes Father    ° ° °Social History °Social History  ° °Tobacco Use  °• Smoking status: Former Smoker  °  Packs/day: 1.00  °  Years: 10.00  °  Pack years: 10.00  °  Types: Cigarettes  °  Quit date: 02/20/2019  °  Years since quitting: 0.7  °• Smokeless tobacco: Never Used  °• Tobacco comment:  1/2 pack a day since age 38  °Substance Use Topics  °• Alcohol use: No  °  Alcohol/week: 0.0 standard drinks  °• Drug use: No  ° ° ° °Allergies   °Metformin ° ° °Review of Systems °Review of Systems  °Neurological: Positive for numbness.  °All other systems reviewed and are negative. ° ° ° °Physical Exam °Updated Vital Signs °BP (!) 128/44    Pulse 85    Temp 98.2 °F (36.8 °C) (Oral)      Resp 10    Ht 5' 2" (1.575 m)    Wt 85.7 kg    SpO2 95%    BMI 34.57 kg/m²  ° °Physical Exam °Vitals signs and nursing note reviewed.  °Constitutional:   °   Appearance: Normal appearance.  °HENT:  °   Head: Normocephalic and atraumatic.  °   Right Ear: External ear normal.  °   Left Ear: External ear normal.  °   Nose: Nose normal.  °   Mouth/Throat:  °   Mouth: Mucous membranes are moist.  °   Pharynx: Oropharynx is clear.  °Eyes:  °   Extraocular Movements: Extraocular movements intact.  °   Conjunctiva/sclera: Conjunctivae normal.  °   Pupils: Pupils are equal, round, and reactive to light.  °Neck:  °   Musculoskeletal: Normal range of motion and neck supple.  °Cardiovascular:  °   Rate and Rhythm: Normal rate and regular rhythm.  °   Pulses: Normal pulses.  °   Heart sounds: Normal heart sounds.  °Pulmonary:  °   Effort: Pulmonary effort is normal.  °   Breath sounds: Normal breath sounds.  °Abdominal:  °   General: Abdomen is flat. Bowel sounds are normal.  °   Palpations: Abdomen is soft.  °Musculoskeletal: Normal range of motion.  °Skin: °   General: Skin is warm.  °   Capillary Refill: Capillary refill takes less than 2 seconds.  °Neurological:  °   Mental Status: She is alert and oriented to person, place, and time.  °   Comments: Subjective numbness  to right hand only.  Good motor movement.  °Psychiatric:     °   Mood and Affect: Mood normal.     °   Behavior: Behavior normal.     °   Thought Content: Thought content normal.     °   Judgment: Judgment normal.  ° ° ° ° °ED Treatments / Results  °Labs °(all labs ordered are listed, but only abnormal results are displayed) °Labs Reviewed  °COMPREHENSIVE METABOLIC PANEL - Abnormal; Notable for the following components:  °    Result Value  ° Potassium 3.2 (*)   ° CO2 20 (*)   ° Glucose, Bld 257 (*)   ° BUN 24 (*)   ° Creatinine, Ser 1.13 (*)   ° Calcium 7.8 (*)   ° Albumin 3.0 (*)   ° AST 62 (*)   ° GFR calc non Af Amer 49 (*)   ° GFR calc Af Amer 57 (*)   ° All other components within normal limits  °CBC WITH DIFFERENTIAL/PLATELET - Abnormal; Notable for the following components:  ° WBC 3.8 (*)   ° RBC 2.61 (*)   ° Hemoglobin 8.8 (*)   ° HCT 27.4 (*)   ° MCV 105.0 (*)   ° RDW 15.8 (*)   ° Platelets 76 (*)   ° nRBC 0.5 (*)   ° Lymphs Abs 0.1 (*)   ° Abs Immature Granulocytes 0.08 (*)   ° All other components within normal limits  °URINALYSIS, ROUTINE W REFLEX MICROSCOPIC - Abnormal; Notable for the following components:  ° APPearance HAZY (*)   ° All other components within normal limits  °CBG MONITORING, ED - Abnormal; Notable for the following components:  ° Glucose-Capillary 250 (*)   ° All other components within normal limits  °TROPONIN I (HIGH SENSITIVITY)  °TROPONIN I (HIGH SENSITIVITY)  ° ° °EKG °EKG Interpretation ° °Date/Time:    EKG Interpretation  Date/Time:  Thursday November 03 2019 10:25:27 EST Ventricular Rate:  84 PR Interval:    QRS Duration: 107 QT Interval:  395 QTC Calculation: 467 R Axis:   27 Text Interpretation: Sinus rhythm Atrial premature complex Low voltage, precordial leads Borderline T abnormalities, lateral leads No significant change since last tracing Confirmed by Isla Pence [id number]) on 11/03/2019 10:51:45 AM   Radiology Dg Chest 2 View  Result Date: 11/03/2019 CLINICAL DATA:  Shortness of breath.  Currently undergoing chemotherapy for multiple myeloma. EXAM: CHEST - 2 VIEW COMPARISON:  Chest x-rays dated 09/01/2019 and 03/28/2019 FINDINGS: The heart size and mediastinal contours are within normal limits. Both lungs are clear. The visualized skeletal structures are unremarkable. IMPRESSION: Normal exam. Electronically Signed   By: Lorriane Shire M.D.   On: 11/03/2019 11:45   Ct Head Wo Contrast  Result Date: 11/03/2019 CLINICAL DATA:  Altered level of consciousness. Right hand numbness headache. History of multiple myeloma. EXAM: CT HEAD WITHOUT CONTRAST TECHNIQUE: Contiguous axial images were obtained from the base of the skull through the vertex without intravenous contrast. COMPARISON:  None. FINDINGS: Brain: No evidence of acute infarction, hemorrhage, hydrocephalus, extra-axial collection or mass lesion/mass effect. Vascular: Negative for hyperdense vessel Skull: Small hypodense lesions in the right frontal bone and right parietal bone possibly due to myeloma. Sinuses/Orbits: Bony thickening of the right maxillary sinus due to chronic sinusitis. No significant mucosal edema. Bilateral cataract surgery. Other: Image quality degraded by motion. IMPRESSION: No acute intracranial abnormality Small lytic lesions in the right frontal bone and right parietal bone possibly myeloma Electronically Signed   By: Franchot Gallo M.D.   On: 11/03/2019 11:51   Mr Brain Wo Contrast  Result Date: 11/03/2019 CLINICAL DATA:  Headache, right hand numbness EXAM: MRI HEAD WITHOUT CONTRAST TECHNIQUE: Multiplanar, multiecho pulse sequences of the brain and surrounding structures were obtained without intravenous contrast. COMPARISON:  None. FINDINGS: Motion artifact is present. Brain: There is no acute infarction or intracranial hemorrhage. Small chronic cortical infarct of the left postcentral gyrus. Additional minimal patchy T2 hyperintensity in the supratentorial matter is nonspecific but may reflect minor chronic  microvascular ischemic changes. There is no intracranial mass, mass effect, or edema. There is no hydrocephalus or extra-axial fluid collection. No abnormal enhancement. Vascular: Major vessel flow voids at the skull base are preserved. Skull and upper cervical spine: Normal marrow signal is preserved. Sinuses/Orbits: Minor paranasal sinus mucosal thickening. Bilateral lens replacements. Other: Sella is unremarkable.  Mastoid air cells are clear. IMPRESSION: No acute infarction, intracranial hemorrhage, or mass. Minor chronic microvascular ischemic changes and small chronic left parietal infarct. Electronically Signed   By: Macy Mis M.D.   On: 11/03/2019 13:30    Procedures Procedures (including critical care time)  Medications Ordered in ED Medications  acetaminophen (TYLENOL) tablet 1,000 mg (1,000 mg Oral Given 11/03/19 1220)  diphenhydrAMINE (BENADRYL) injection 25 mg (25 mg Intravenous Given 11/03/19 1220)  metoCLOPramide (REGLAN) injection 10 mg (10 mg Intravenous Given 11/03/19 1221)     Initial Impression / Assessment and Plan / ED Course  I have reviewed the triage vital signs and the nursing notes.  Pertinent labs & imaging results that were available during my care of the patient were reviewed by me and considered in my medical decision making (see chart for details).       No CVA on MRI.  Pt does have pancytopenia.  This is likely from the chemo.  Pt aware.  Pt is starting to  her fingers.  Her BS went very high with steroids, so I am not going to put her back on them in case this is from a pinched cervical nerve.  Pt knows to f/u with her pcp and with her oncologist.  Return if worse. ° °Final Clinical Impressions(s) / ED Diagnoses  ° °Final diagnoses:  °Multiple myeloma not having achieved remission ([facility])  °Pancytopenia ([facility])  °Numbness and tingling in right hand  ° ° °ED Discharge Orders   ° None  °  ° °  °Haviland, Julie, MD °11/03/19 1514 ° °

## 2019-11-03 NOTE — ED Triage Notes (Signed)
Reports she had her chemo treatment yesterday.  Reports last night woke up at midnight with sharp headache.  Approx 1 am pt noticed r hand was numb.  Pt thought may have slept on it wrong but it hasn't improved.  Pt says can feel her r little finger but not the others.

## 2019-11-03 NOTE — ED Notes (Signed)
Pt attempted to urinate and missed the hat, will try again later.

## 2019-11-03 NOTE — ED Notes (Signed)
Pt goes weekly on Wednesday for chemo tx

## 2019-11-03 NOTE — ED Notes (Signed)
Patient transported to MRI 

## 2019-11-03 NOTE — Telephone Encounter (Signed)
Received a call from patient stating that she has no feeling in her Right hand.  This started at midnight last night and now her hand is completely numb.  She can move her fingers and hand but no feeling.  Dr. [name] notified.  He suggests she go to the ED today to be checked out.  Patient understanding and states she will do this today.

## 2019-11-03 NOTE — ED Notes (Signed)
Pt back from MRI 

## 2019-11-08 ENCOUNTER — Other Ambulatory Visit: Payer: Self-pay

## 2019-11-08 DIAGNOSIS — C9000 Multiple myeloma not having achieved remission: Secondary | ICD-10-CM

## 2019-11-09 ENCOUNTER — Inpatient Hospital Stay: Payer: Medicare Other

## 2019-11-09 ENCOUNTER — Other Ambulatory Visit: Payer: Self-pay

## 2019-11-09 VITALS — BP 177/72 | HR 57 | Temp 97.3°F | Resp 17

## 2019-11-09 DIAGNOSIS — C9000 Multiple myeloma not having achieved remission: Secondary | ICD-10-CM | POA: Diagnosis not present

## 2019-11-09 DIAGNOSIS — Z5112 Encounter for antineoplastic immunotherapy: Secondary | ICD-10-CM | POA: Diagnosis not present

## 2019-11-09 DIAGNOSIS — Z5111 Encounter for antineoplastic chemotherapy: Secondary | ICD-10-CM | POA: Diagnosis not present

## 2019-11-09 LAB — CBC WITH DIFFERENTIAL (CANCER CENTER ONLY)
Abs Immature Granulocytes: 0.02 10*3/uL (ref 0.00–0.07)
Basophils Absolute: 0 10*3/uL (ref 0.0–0.1)
Basophils Relative: 0 %
Eosinophils Absolute: 0 10*3/uL (ref 0.0–0.5)
Eosinophils Relative: 1 %
HCT: 25.9 % — ABNORMAL LOW (ref 36.0–46.0)
Hemoglobin: 8.8 g/dL — ABNORMAL LOW (ref 12.0–15.0)
Immature Granulocytes: 1 %
Lymphocytes Relative: 14 %
Lymphs Abs: 0.3 10*3/uL — ABNORMAL LOW (ref 0.7–4.0)
MCH: 34.1 pg — ABNORMAL HIGH (ref 26.0–34.0)
MCHC: 34 g/dL (ref 30.0–36.0)
MCV: 100.4 fL — ABNORMAL HIGH (ref 80.0–100.0)
Monocytes Absolute: 0.5 10*3/uL (ref 0.1–1.0)
Monocytes Relative: 21 %
Neutro Abs: 1.5 10*3/uL — ABNORMAL LOW (ref 1.7–7.7)
Neutrophils Relative %: 63 %
Platelet Count: 48 10*3/uL — ABNORMAL LOW (ref 150–400)
RBC: 2.58 MIL/uL — ABNORMAL LOW (ref 3.87–5.11)
RDW: 14.6 % (ref 11.5–15.5)
WBC Count: 2.4 10*3/uL — ABNORMAL LOW (ref 4.0–10.5)
nRBC: 0 % (ref 0.0–0.2)

## 2019-11-09 LAB — CMP (CANCER CENTER ONLY)
ALT: 9 U/L (ref 0–44)
AST: 8 U/L — ABNORMAL LOW (ref 15–41)
Albumin: 3.7 g/dL (ref 3.5–5.0)
Alkaline Phosphatase: 90 U/L (ref 38–126)
Anion gap: 7 (ref 5–15)
BUN: 12 mg/dL (ref 8–23)
CO2: 27 mmol/L (ref 22–32)
Calcium: 8.6 mg/dL — ABNORMAL LOW (ref 8.9–10.3)
Chloride: 110 mmol/L (ref 98–111)
Creatinine: 0.79 mg/dL (ref 0.44–1.00)
GFR, Est AFR Am: >60 mL/min (ref >60)
GFR, Estimated: >60 mL/min (ref >60)
Glucose, Bld: 141 mg/dL — ABNORMAL HIGH (ref 70–99)
Potassium: 3.5 mmol/L (ref 3.5–5.1)
Sodium: 144 mmol/L (ref 135–145)
Total Bilirubin: 0.5 mg/dL (ref 0.3–1.2)
Total Protein: 6.4 g/dL — ABNORMAL LOW (ref 6.5–8.1)

## 2019-11-09 MED ORDER — DIPHENHYDRAMINE HCL 25 MG PO CAPS
ORAL_CAPSULE | ORAL | Status: AC
  Filled 2019-11-09: qty 2

## 2019-11-09 MED ORDER — DEXAMETHASONE 4 MG PO TABS
ORAL_TABLET | ORAL | Status: AC
  Filled 2019-11-09: qty 5

## 2019-11-09 MED ORDER — DIPHENHYDRAMINE HCL 25 MG PO CAPS
50.0000 mg | ORAL_CAPSULE | Freq: Once | ORAL | Status: AC
  Administered 2019-11-09: 09:00:00 50 mg via ORAL

## 2019-11-09 MED ORDER — DEXAMETHASONE 4 MG PO TABS
20.0000 mg | ORAL_TABLET | Freq: Once | ORAL | Status: AC
  Administered 2019-11-09: 20 mg via ORAL

## 2019-11-09 MED ORDER — DARATUMUMAB-HYALURONIDASE-FIHJ 1800-30000 MG-UT/15ML ~~LOC~~ SOLN
1800.0000 mg | Freq: Once | SUBCUTANEOUS | Status: AC
  Administered 2019-11-09: 1800 mg via SUBCUTANEOUS
  Filled 2019-11-09: qty 15

## 2019-11-09 MED ORDER — ACETAMINOPHEN 325 MG PO TABS
650.0000 mg | ORAL_TABLET | Freq: Once | ORAL | Status: AC
  Administered 2019-11-09: 650 mg via ORAL

## 2019-11-09 MED ORDER — MONTELUKAST SODIUM 10 MG PO TABS
10.0000 mg | ORAL_TABLET | Freq: Once | ORAL | Status: AC
  Administered 2019-11-09: 10 mg via ORAL
  Filled 2019-11-09: qty 1

## 2019-11-09 MED ORDER — ACETAMINOPHEN 325 MG PO TABS
ORAL_TABLET | ORAL | Status: AC
  Filled 2019-11-09: qty 2

## 2019-11-09 NOTE — Progress Notes (Signed)
Platelets 48,000. WBC 2.4. Hgb 8.8.  Labs reviewed with Dr. [name].  Ok to treat today with Faspro.  Hold Kyprolis.

## 2019-11-09 NOTE — Patient Instructions (Signed)
Daratumumab injection What is this medicine? DARATUMUMAB (dar a toom ue mab) is a monoclonal antibody. It is used to treat multiple myeloma. This medicine may be used for other purposes; ask your health care provider or pharmacist if you have questions. COMMON BRAND NAME(S): DARZALEX What should I tell my health care provider before I take this medicine? They need to know if you have any of these conditions:  infection (especially a virus infection such as chickenpox, herpes, or hepatitis B virus)  lung or breathing disease  an unusual or allergic reaction to daratumumab, other medicines, foods, dyes, or preservatives  pregnant or trying to get pregnant  breast-feeding How should I use this medicine? This medicine is for infusion into a vein. It is given by a health care professional in a hospital or clinic setting. Talk to your pediatrician regarding the use of this medicine in children. Special care may be needed. Overdosage: If you think you have taken too much of this medicine contact a poison control center or emergency room at once. NOTE: This medicine is only for you. Do not share this medicine with others. What if I miss a dose? Keep appointments for follow-up doses as directed. It is important not to miss your dose. Call your doctor or health care professional if you are unable to keep an appointment. What may interact with this medicine? Interactions have not been studied. This list may not describe all possible interactions. Give your health care provider a list of all the medicines, herbs, non-prescription drugs, or dietary supplements you use. Also tell them if you smoke, drink alcohol, or use illegal drugs. Some items may interact with your medicine. What should I watch for while using this medicine? This drug may make you feel generally unwell. Report any side effects. Continue your course of treatment even though you feel ill unless your doctor tells you to stop. This  medicine can cause serious allergic reactions. To reduce your risk you may need to take medicine before treatment with this medicine. Take your medicine as directed. This medicine can affect the results of blood tests to match your blood type. These changes can last for up to 6 months after the final dose. Your healthcare provider will do blood tests to match your blood type before you start treatment. Tell all of your healthcare providers that you are being treated with this medicine before receiving a blood transfusion. This medicine can affect the results of some tests used to determine treatment response; extra tests may be needed to evaluate response. Do not become pregnant while taking this medicine or for 3 months after stopping it. Women should inform their doctor if they wish to become pregnant or think they might be pregnant. There is a potential for serious side effects to an unborn child. Talk to your health care professional or pharmacist for more information. What side effects may I notice from receiving this medicine? Side effects that you should report to your doctor or health care professional as soon as possible:  allergic reactions like skin rash, itching or hives, swelling of the face, lips, or tongue  breathing problems  chills  cough  dizziness  feeling faint or lightheaded  headache  low blood counts - this medicine may decrease the number of white blood cells, red blood cells and platelets. You may be at increased risk for infections and bleeding.  nausea, vomiting  shortness of breath  signs of decreased platelets or bleeding - bruising, pinpoint red spots on  the skin, black, tarry stools, blood in the urine  signs of decreased red blood cells - unusually weak or tired, feeling faint or lightheaded, falls  signs of infection - fever or chills, cough, sore throat, pain or difficulty passing urine  signs and symptoms of liver injury like dark yellow or brown  urine; general ill feeling or flu-like symptoms; light-colored stools; loss of appetite; right upper belly pain; unusually weak or tired; yellowing of the eyes or skin Side effects that usually do not require medical attention (report to your doctor or health care professional if they continue or are bothersome):  back pain  constipation  loss of appetite  diarrhea  joint pain  muscle cramps  pain, tingling, numbness in the hands or feet  swelling of the ankles, feet, hands  tiredness  trouble sleeping This list may not describe all possible side effects. Call your doctor for medical advice about side effects. You may report side effects to FDA at 1-800-FDA-1088. Where should I keep my medicine? Keep out of the reach of children. This drug is given in a hospital or clinic and will not be stored at home. NOTE: This sheet is a summary. It may not cover all possible information. If you have questions about this medicine, talk to your doctor, pharmacist, or health care provider.  2020 Elsevier/Gold Standard (2018-09-23 14:00:48) Montelukast oral tablets What is this medicine? MONTELUKAST (mon te LOO kast) is used to prevent and treat the symptoms of asthma. It is also used to treat allergies. Do not use for an acute asthma attack. This medicine may be used for other purposes; ask your health care provider or pharmacist if you have questions. COMMON BRAND NAME(S): Singulair What should I tell my health care provider before I take this medicine? They need to know if you have any of these conditions:  liver disease  an unusual or allergic reaction to montelukast, other medicines, foods, dyes, or preservatives  pregnant or trying to get pregnant  breast-feeding How should I use this medicine? This medicine should be given by mouth. Follow the directions on the prescription label. Take this medicine at the same time every day. You may take this medicine with or without meals. Do  not chew the tablets. Do not stop taking your medicine unless your doctor tells you to. Talk to your pediatrician regarding the use of this medicine in children. Special care may be needed. While this drug may be prescribed for children as young as 4 years of age for selected conditions, precautions do apply. Overdosage: If you think you have taken too much of this medicine contact a poison control center or emergency room at once. NOTE: This medicine is only for you. Do not share this medicine with others. What if I miss a dose? If you miss a dose, skip it. Take your next dose at the normal time. Do not take extra or 2 doses at the same time to make up for the missed dose. What may interact with this medicine?  anti-infectives like rifampin and rifabutin  medicines for seizures like phenytoin, phenobarbital, and carbamazepine This list may not describe all possible interactions. Give your health care provider a list of all the medicines, herbs, non-prescription drugs, or dietary supplements you use. Also tell them if you smoke, drink alcohol, or use illegal drugs. Some items may interact with your medicine. What should I watch for while using this medicine? Visit your doctor or health care professional for regular checks on your  progress. Tell your doctor or health care professional if your allergy or asthma symptoms do not improve. Take your medicine even when you do not have symptoms. Do not stop taking any of your medicine(s) unless your doctor tells you to. If you have asthma, talk to your doctor about what to do in an acute asthma attack. Always have your inhaled rescue medicine for asthma attacks with you. Patients and their families should watch for new or worsening thoughts of suicide or depression. Also watch for sudden changes in feelings such as feeling anxious, agitated, panicky, irritable, hostile, aggressive, impulsive, severely restless, overly excited and hyperactive, or not being  able to sleep. Any worsening of mood or thoughts of suicide or dying should be reported to your health care professional right away. What side effects may I notice from receiving this medicine? Side effects that you should report to your doctor or health care professional as soon as possible:  allergic reactions like skin rash or hives, or swelling of the face, lips, or tongue  breathing problems  changes in emotions or moods  confusion  depressed mood  fever or infection  hallucinations  joint pain  painful lumps under the skin  pain, tingling, numbness in the hands or feet  redness, blistering, peeling, or loosening of the skin, including inside the mouth  restlessness  seizures  sleep walking  signs and symptoms of infection like fever; chills; cough; sore throat; flu-like illness  signs and symptoms of liver injury like dark yellow or brown urine; general ill feeling or flu-like symptoms; light-colored stools; loss of appetite; nausea; right upper belly pain; unusually weak or tired; yellowing of the eyes or skin  sinus pain or swelling  stuttering  suicidal thoughts or other mood changes  tremors  trouble sleeping  uncontrolled muscle movements  unusual bleeding or bruising  vivid or bad dreams Side effects that usually do not require medical attention (report to your doctor or health care professional if they continue or are bothersome):  dizziness  drowsiness  headache  runny nose  stomach upset  tiredness This list may not describe all possible side effects. Call your doctor for medical advice about side effects. You may report side effects to FDA at 1-800-FDA-1088. Where should I keep my medicine? Keep out of the reach of children. Store at room temperature between 15 and 30 degrees C (59 and 86 degrees F). Protect from light and moisture. Keep this medicine in the original bottle. Throw away any unused medicine after the expiration  date. NOTE: This sheet is a summary. It may not cover all possible information. If you have questions about this medicine, talk to your doctor, pharmacist, or health care provider.  2020 Else[employer]/Gold Standard (2019-04-08 12:54:33) Acetaminophen tablets or caplets What is this medicine? ACETAMINOPHEN (a set a MEE noe fen) is a pain reliever. It is used to treat mild pain and fever. This medicine may be used for other purposes; ask your health care provider or pharmacist if you have questions. COMMON BRAND NAME(S): Aceta, Actamin, Anacin Aspirin Free, Genapap, Genebs, Mapap, Pain & Fever, Pain and Fever, PAIN RELIEF, PAIN RELIEF Extra Strength, Pain Reliever, Panadol, PHARBETOL, Q-Pap, Q-Pap Extra Strength, Tylenol, Tylenol CrushableTablet, Tylenol Extra Strength, XS No Aspirin, XS Pain Reliever What should I tell my health care provider before I take this medicine? They need to know if you have any of these conditions:  if you often drink alcohol  liver disease  an unusual or allergic reaction to  acetaminophen, other medicines, foods, dyes, or preservatives  pregnant or trying to get pregnant  breast-feeding How should I use this medicine? Take this medicine by mouth with a glass of water. Follow the directions on the package or prescription label. Take your medicine at regular intervals. Do not take your medicine more often than directed. Talk to your pediatrician regarding the use of this medicine in children. While this drug may be prescribed for children as young as 69 years of age for selected conditions, precautions do apply. Overdosage: If you think you have taken too much of this medicine contact a poison control center or emergency room at once. NOTE: This medicine is only for you. Do not share this medicine with others. What if I miss a dose? If you miss a dose, take it as soon as you can. If it is almost time for your next dose, take only that dose. Do not take double or extra  doses. What may interact with this medicine?  alcohol  imatinib  isoniazid  other medicines with acetaminophen This list may not describe all possible interactions. Give your health care provider a list of all the medicines, herbs, non-prescription drugs, or dietary supplements you use. Also tell them if you smoke, drink alcohol, or use illegal drugs. Some items may interact with your medicine. What should I watch for while using this medicine? Tell your doctor or health care professional if the pain lasts more than 10 days (5 days for children), if it gets worse, or if there is a new or different kind of pain. Also, check with your doctor if a fever lasts for more than 3 days. Do not take other medicines that contain acetaminophen with this medicine. Always read labels carefully. If you have questions, ask your doctor or pharmacist. If you take too much acetaminophen get medical help right away. Too much acetaminophen can be very dangerous and cause liver damage. Even if you do not have symptoms, it is important to get help right away. What side effects may I notice from receiving this medicine? Side effects that you should report to your doctor or health care professional as soon as possible:  allergic reactions like skin rash, itching or hives, swelling of the face, lips, or tongue  breathing problems  fever or sore throat  redness, blistering, peeling or loosening of the skin, including inside the mouth  trouble passing urine or change in the amount of urine  unusual bleeding or bruising  unusually weak or tired  yellowing of the eyes or skin Side effects that usually do not require medical attention (report to your doctor or health care professional if they continue or are bothersome):  headache  nausea, stomach upset This list may not describe all possible side effects. Call your doctor for medical advice about side effects. You may report side effects to FDA at  1-800-FDA-1088. Where should I keep my medicine? Keep out of reach of children. Store at room temperature between 20 and 25 degrees C (68 and 77 degrees F). Protect from moisture and heat. Throw away any unused medicine after the expiration date. NOTE: This sheet is a summary. It may not cover all possible information. If you have questions about this medicine, talk to your doctor, pharmacist, or health care provider.  2020 Elsevier/Gold Standard (2013-08-01 12:54:16) Diphenhydramine capsules or tablets What is this medicine? DIPHENHYDRAMINE (dye fen HYE dra meen) is an antihistamine. It is used to treat the symptoms of an allergic reaction. It is  also used to treat Parkinson's disease. This medicine is also used to prevent and to treat motion sickness and as a nighttime sleep aid. This medicine may be used for other purposes; ask your health care provider or pharmacist if you have questions. COMMON BRAND NAME(S): Alka-Seltzer Plus Allergy, Aller-G-Time, Banophen, Benadryl Allergy, Benadryl Allergy Dye Free, Benadryl Allergy Kapgel, Benadryl Allergy Ultratab, Diphedryl, Diphenhist, Genahist, Geri-Dryl, PHARBEDRYL, Q-Dryl, Gretta Began, Valu-Dryl, Vicks ZzzQuil Nightime Sleep-Aid What should I tell my health care provider before I take this medicine? They need to know if you have any of these conditions:  asthma or lung disease  glaucoma  high blood pressure or heart disease  liver disease  pain or difficulty passing urine  prostate trouble  ulcers or other stomach problems  an unusual or allergic reaction to diphenhydramine, other medicines foods, dyes, or preservatives such as sulfites  pregnant or trying to get pregnant  breast-feeding How should I use this medicine? Take this medicine by mouth with a full glass of water. Follow the directions on the prescription label. Take your doses at regular intervals. Do not take your medicine more often than directed. To prevent motion  sickness start taking this medicine 30 to 60 minutes before you leave. Talk to your pediatrician regarding the use of this medicine in children. Special care may be needed. Patients over 80 years old may have a stronger reaction and need a smaller dose. Overdosage: If you think you have taken too much of this medicine contact a poison control center or emergency room at once. NOTE: This medicine is only for you. Do not share this medicine with others. What if I miss a dose? If you miss a dose, take it as soon as you can. If it is almost time for your next dose, take only that dose. Do not take double or extra doses. What may interact with this medicine? Do not take this medicine with any of the following medications:  MAOIs like Carbex, Eldepryl, Marplan, Nardil, and Parnate This medicine may also interact with the following medications:  alcohol  barbiturates, like phenobarbital  medicines for bladder spasm like oxybutynin, tolterodine  medicines for blood pressure  medicines for depression, anxiety, or psychotic disturbances  medicines for movement abnormalities or Parkinson's disease  medicines for sleep  other medicines for cold, cough or allergy  some medicines for the stomach like chlordiazepoxide, dicyclomine This list may not describe all possible interactions. Give your health care provider a list of all the medicines, herbs, non-prescription drugs, or dietary supplements you use. Also tell them if you smoke, drink alcohol, or use illegal drugs. Some items may interact with your medicine. What should I watch for while using this medicine? Visit your doctor or health care professional for regular check ups. Tell your doctor if your symptoms do not improve or if they get worse. Your mouth may get dry. Chewing sugarless gum or sucking hard candy, and drinking plenty of water may help. Contact your doctor if the problem does not go away or is severe. This medicine may cause dry  eyes and blurred vision. If you wear contact lenses you may feel some discomfort. Lubricating drops may help. See your eye doctor if the problem does not go away or is severe. You may get drowsy or dizzy. Do not drive, use machinery, or do anything that needs mental alertness until you know how this medicine affects you. Do not stand or sit up quickly, especially if you are an older patient. This  reduces the risk of dizzy or fainting spells. Alcohol may interfere with the effect of this medicine. Avoid alcoholic drinks. What side effects may I notice from receiving this medicine? Side effects that you should report to your doctor or health care professional as soon as possible:  allergic reactions like skin rash, itching or hives, swelling of the face, lips, or tongue  changes in vision  confused, agitated, nervous  irregular or fast heartbeat  tremor  trouble passing urine  unusual bleeding or bruising  unusually weak or tired Side effects that usually do not require medical attention (report to your doctor or health care professional if they continue or are bothersome):  constipation, diarrhea  drowsy  headache  loss of appetite  stomach upset, vomiting  thick mucous This list may not describe all possible side effects. Call your doctor for medical advice about side effects. You may report side effects to FDA at 1-800-FDA-1088. Where should I keep my medicine? Keep out of the reach of children. Store at room temperature between 15 and 30 degrees C (59 and 86 degrees F). Keep container closed tightly. Throw away any unused medicine after the expiration date. NOTE: This sheet is a summary. It may not cover all possible information. If you have questions about this medicine, talk to your doctor, pharmacist, or health care provider.  2020 Elsevier/Gold Standard (2008-03-27 17:06:22) Dexamethasone injection What is this medicine? DEXAMETHASONE (dex a METH a sone) is a  corticosteroid. It is used to treat inflammation of the skin, joints, lungs, and other organs. Common conditions treated include asthma, allergies, and arthritis. It is also used for other conditions, like blood disorders and diseases of the adrenal glands. This medicine may be used for other purposes; ask your health care provider or pharmacist if you have questions. COMMON BRAND NAME(S): Decadron, DoubleDex, Simplist Dexamethasone, Solurex What should I tell my health care provider before I take this medicine? They need to know if you have any of these conditions:  blood clotting problems  Cushing's syndrome  diabetes  glaucoma  heart problems or disease  high blood pressure  infection like herpes, measles, tuberculosis, or chickenpox  kidney disease  liver disease  mental problems  myasthenia gravis  osteoporosis  previous heart attack  seizures  stomach, ulcer or intestine disease including colitis and diverticulitis  thyroid problem  an unusual or allergic reaction to dexamethasone, corticosteroids, other medicines, lactose, foods, dyes, or preservatives  pregnant or trying to get pregnant  breast-feeding How should I use this medicine? This medicine is for injection into a muscle, joint, lesion, soft tissue, or vein. It is given by a health care professional in a hospital or clinic setting. Talk to your pediatrician regarding the use of this medicine in children. Special care may be needed. Overdosage: If you think you have taken too much of this medicine contact a poison control center or emergency room at once. NOTE: This medicine is only for you. Do not share this medicine with others. What if I miss a dose? This may not apply. If you are having a series of injections over a prolonged period, try not to miss an appointment. Call your doctor or health care professional to reschedule if you are unable to keep an appointment. What may interact with this  medicine? Do not take this medicine with any of the following medications:  mifepristone, RU-486  vaccines This medicine may also interact with the following medications:  amphotericin B  antibiotics like clarithromycin, erythromycin, and  troleandomycin  aspirin and aspirin-like drugs  barbiturates like phenobarbital  carbamazepine  cholestyramine  cholinesterase inhibitors like donepezil, galantamine, rivastigmine, and tacrine  cyclosporine  digoxin  diuretics  ephedrine  female hormones, like estrogens or progestins and birth control pills  indinavir  isoniazid  ketoconazole  medicines for diabetes  medicines that improve muscle tone or strength for conditions like myasthenia gravis  NSAIDs, medicines for pain and inflammation, like ibuprofen or naproxen  phenytoin  rifampin  thalidomide  warfarin This list may not describe all possible interactions. Give your health care provider a list of all the medicines, herbs, non-prescription drugs, or dietary supplements you use. Also tell them if you smoke, drink alcohol, or use illegal drugs. Some items may interact with your medicine. What should I watch for while using this medicine? Your condition will be monitored carefully while you are receiving this medicine. If you are taking this medicine for a long time, carry an identification card with your name and address, the type and dose of your medicine, and your doctor's name and address. This medicine may increase your risk of getting an infection. Stay away from people who are sick. Tell your doctor or health care professional if you are around anyone with measles or chickenpox. Talk to your health care provider before you get any vaccines that you take this medicine. If you are going to have surgery, tell your doctor or health care professional that you have taken this medicine within the last twelve months. Ask your doctor or health care professional about  your diet. You may need to lower the amount of salt you eat. This medicine may increase blood sugar. Ask your healthcare provider if changes in diet or medicines are needed if you have diabetes. What side effects may I notice from receiving this medicine? Side effects that you should report to your doctor or health care professional as soon as possible:  allergic reactions like skin rash, itching or hives, swelling of the face, lips, or tongue  black or tarry stools  change in the amount of urine  confusion, excitement, restlessness, a false sense of well-being  fever, sore throat, sneezing, cough, or other signs of infection, wounds that will not heal  hallucinations  mental depression, mood swings, mistaken feelings of self importance or of being mistreated  pain in hips, back, ribs, arms, shoulders, or legs  pain, redness, or irritation at the injection site  redness, blistering, peeling or loosening of the skin, including inside the mouth  rounding out of face   signs and symptoms of high blood sugar such as being more thirsty or hungry or having to urinate more than normal. You may also feel very tired or have blurry vision.  swelling of feet or lower legs  unusual bleeding or bruising  wounds that do not heal Side effects that usually do not require medical attention (report to your doctor or health care professional if they continue or are bothersome):  diarrhea or constipation  change in taste  headache  nausea, vomiting  skin problems, acne, thin and shiny skin  trouble sleeping  unusual growth of hair on the face or body  weight gain This list may not describe all possible side effects. Call your doctor for medical advice about side effects. You may report side effects to FDA at 1-800-FDA-1088. Where should I keep my medicine? This drug is given in a hospital or clinic and will not be stored at home. NOTE: This sheet is a summary.  It may not cover all  possible information. If you have questions about this medicine, talk to your doctor, pharmacist, or health care provider.  2020 Elsevier/Gold Standard (2018-09-08 10:35:53) Daratumumab injection What is this medicine? DARATUMUMAB (dar a toom ue mab) is a monoclonal antibody. It is used to treat multiple myeloma. This medicine may be used for other purposes; ask your health care provider or pharmacist if you have questions. COMMON BRAND NAME(S): DARZALEX What should I tell my health care provider before I take this medicine? They need to know if you have any of these conditions:  infection (especially a virus infection such as chickenpox, herpes, or hepatitis B virus)  lung or breathing disease  an unusual or allergic reaction to daratumumab, other medicines, foods, dyes, or preservatives  pregnant or trying to get pregnant  breast-feeding How should I use this medicine? This medicine is for infusion into a vein. It is given by a health care professional in a hospital or clinic setting. Talk to your pediatrician regarding the use of this medicine in children. Special care may be needed. Overdosage: If you think you have taken too much of this medicine contact a poison control center or emergency room at once. NOTE: This medicine is only for you. Do not share this medicine with others. What if I miss a dose? Keep appointments for follow-up doses as directed. It is important not to miss your dose. Call your doctor or health care professional if you are unable to keep an appointment. What may interact with this medicine? Interactions have not been studied. This list may not describe all possible interactions. Give your health care provider a list of all the medicines, herbs, non-prescription drugs, or dietary supplements you use. Also tell them if you smoke, drink alcohol, or use illegal drugs. Some items may interact with your medicine. What should I watch for while using this  medicine? This drug may make you feel generally unwell. Report any side effects. Continue your course of treatment even though you feel ill unless your doctor tells you to stop. This medicine can cause serious allergic reactions. To reduce your risk you may need to take medicine before treatment with this medicine. Take your medicine as directed. This medicine can affect the results of blood tests to match your blood type. These changes can last for up to 6 months after the final dose. Your healthcare provider will do blood tests to match your blood type before you start treatment. Tell all of your healthcare providers that you are being treated with this medicine before receiving a blood transfusion. This medicine can affect the results of some tests used to determine treatment response; extra tests may be needed to evaluate response. Do not become pregnant while taking this medicine or for 3 months after stopping it. Women should inform their doctor if they wish to become pregnant or think they might be pregnant. There is a potential for serious side effects to an unborn child. Talk to your health care professional or pharmacist for more information. What side effects may I notice from receiving this medicine? Side effects that you should report to your doctor or health care professional as soon as possible:  allergic reactions like skin rash, itching or hives, swelling of the face, lips, or tongue  breathing problems  chills  cough  dizziness  feeling faint or lightheaded  headache  low blood counts - this medicine may decrease the number of white blood cells, red blood cells and platelets.  You may be at increased risk for infections and bleeding.  nausea, vomiting  shortness of breath  signs of decreased platelets or bleeding - bruising, pinpoint red spots on the skin, black, tarry stools, blood in the urine  signs of decreased red blood cells - unusually weak or tired, feeling  faint or lightheaded, falls  signs of infection - fever or chills, cough, sore throat, pain or difficulty passing urine  signs and symptoms of liver injury like dark yellow or brown urine; general ill feeling or flu-like symptoms; light-colored stools; loss of appetite; right upper belly pain; unusually weak or tired; yellowing of the eyes or skin Side effects that usually do not require medical attention (report to your doctor or health care professional if they continue or are bothersome):  back pain  constipation  loss of appetite  diarrhea  joint pain  muscle cramps  pain, tingling, numbness in the hands or feet  swelling of the ankles, feet, hands  tiredness  trouble sleeping This list may not describe all possible side effects. Call your doctor for medical advice about side effects. You may report side effects to FDA at 1-800-FDA-1088. Where should I keep my medicine? Keep out of the reach of children. This drug is given in a hospital or clinic and will not be stored at home. NOTE: This sheet is a summary. It may not cover all possible information. If you have questions about this medicine, talk to your doctor, pharmacist, or health care provider.  2020 Elsevier/Gold Standard (2018-09-23 14:00:48) Carfilzomib injection What is this medicine? CARFILZOMIB (kar FILZ oh mib) targets a specific protein within cancer cells and stops the cancer cells from growing. It is used to treat multiple myeloma. This medicine may be used for other purposes; ask your health care provider or pharmacist if you have questions. COMMON BRAND NAME(S): KYPROLIS What should I tell my health care provider before I take this medicine? They need to know if you have any of these conditions:  heart disease  history of blood clots  irregular heartbeat  kidney disease  liver disease  lung or breathing disease  an unusual or allergic reaction to carfilzomib, or other medicines, foods, dyes,  or preservatives  pregnant or trying to get pregnant  breast-feeding How should I use this medicine? This medicine is for injection or infusion into a vein. It is given by a health care professional in a hospital or clinic setting. Talk to your pediatrician regarding the use of this medicine in children. Special care may be needed. Overdosage: If you think you have taken too much of this medicine contact a poison control center or emergency room at once. NOTE: This medicine is only for you. Do not share this medicine with others. What if I miss a dose? It is important not to miss your dose. Call your doctor or health care professional if you are unable to keep an appointment. What may interact with this medicine? Interactions are not expected. Give your health care provider a list of all the medicines, herbs, non-prescription drugs, or dietary supplements you use. Also tell them if you smoke, drink alcohol, or use illegal drugs. Some items may interact with your medicine. This list may not describe all possible interactions. Give your health care provider a list of all the medicines, herbs, non-prescription drugs, or dietary supplements you use. Also tell them if you smoke, drink alcohol, or use illegal drugs. Some items may interact with your medicine. What should I watch for  while using this medicine? Your condition will be monitored carefully while you are receiving this medicine. Report any side effects. Continue your course of treatment even though you feel ill unless your doctor tells you to stop. You may need blood work done while you are taking this medicine. Do not become pregnant while taking this medicine or for at least 6 months after stopping it. Women should inform their doctor if they wish to become pregnant or think they might be pregnant. There is a potential for serious side effects to an unborn child. Men should not father a child while taking this medicine and for at least 3  months after stopping it. Talk to your health care professional or pharmacist for more information. Do not breast-feed an infant while taking this medicine or for 2 weeks after the last dose. Check with your doctor or health care professional if you get an attack of severe diarrhea, nausea and vomiting, or if you sweat a lot. The loss of too much body fluid can make it dangerous for you to take this medicine. You may get dizzy. Do not drive, use machinery, or do anything that needs mental alertness until you know how this medicine affects you. Do not stand or sit up quickly, especially if you are an older patient. This reduces the risk of dizzy or fainting spells. What side effects may I notice from receiving this medicine? Side effects that you should report to your doctor or health care professional as soon as possible:  allergic reactions like skin rash, itching or hives, swelling of the face, lips, or tongue  confusion  dizziness  feeling faint or lightheaded  fever or chills  palpitations  seizures  signs and symptoms of bleeding such as bloody or black, tarry stools; red or dark-brown urine; spitting up blood or brown material that looks like coffee grounds; red spots on the skin; unusual bruising or bleeding including from the eye, gums, or nose  signs and symptoms of a blood clot such as breathing problems; changes in vision; chest pain; severe, sudden headache; pain, swelling, warmth in the leg; trouble speaking; sudden numbness or weakness of the face, arm or leg  signs and symptoms of kidney injury like trouble passing urine or change in the amount of urine  signs and symptoms of liver injury like dark yellow or brown urine; general ill feeling or flu-like symptoms; light-colored stools; loss of appetite; nausea; right upper belly pain; unusually weak or tired; yellowing of the eyes or skin Side effects that usually do not require medical attention (report to your doctor or  health care professional if they continue or are bothersome):  back pain  cough  diarrhea  headache  muscle cramps  vomiting This list may not describe all possible side effects. Call your doctor for medical advice about side effects. You may report side effects to FDA at 1-800-FDA-1088. Where should I keep my medicine? This drug is given in a hospital or clinic and will not be stored at home. NOTE: This sheet is a summary. It may not cover all possible information. If you have questions about this medicine, talk to your doctor, pharmacist, or health care provider.  2020 Elsevier/Gold Standard (2017-09-23 14:07:13)

## 2019-11-23 ENCOUNTER — Inpatient Hospital Stay: Payer: Medicare Other | Attending: Hematology & Oncology | Admitting: Hematology & Oncology

## 2019-11-23 ENCOUNTER — Encounter: Payer: Self-pay | Admitting: Hematology & Oncology

## 2019-11-23 ENCOUNTER — Telehealth: Payer: Self-pay | Admitting: Hematology & Oncology

## 2019-11-23 ENCOUNTER — Inpatient Hospital Stay: Payer: Medicare Other

## 2019-11-23 ENCOUNTER — Other Ambulatory Visit: Payer: Self-pay

## 2019-11-23 VITALS — BP 156/63 | HR 71 | Temp 97.1°F | Resp 20 | Wt 190.8 lb

## 2019-11-23 DIAGNOSIS — C9000 Multiple myeloma not having achieved remission: Secondary | ICD-10-CM

## 2019-11-23 DIAGNOSIS — R197 Diarrhea, unspecified: Secondary | ICD-10-CM | POA: Diagnosis not present

## 2019-11-23 DIAGNOSIS — Z79899 Other long term (current) drug therapy: Secondary | ICD-10-CM | POA: Diagnosis not present

## 2019-11-23 DIAGNOSIS — Z5111 Encounter for antineoplastic chemotherapy: Secondary | ICD-10-CM | POA: Diagnosis not present

## 2019-11-23 DIAGNOSIS — Z794 Long term (current) use of insulin: Secondary | ICD-10-CM | POA: Diagnosis not present

## 2019-11-23 DIAGNOSIS — Z9641 Presence of insulin pump (external) (internal): Secondary | ICD-10-CM | POA: Diagnosis not present

## 2019-11-23 DIAGNOSIS — R110 Nausea: Secondary | ICD-10-CM | POA: Diagnosis not present

## 2019-11-23 LAB — CMP (CANCER CENTER ONLY)
ALT: 10 U/L (ref 0–44)
AST: 9 U/L — ABNORMAL LOW (ref 15–41)
Albumin: 4 g/dL (ref 3.5–5.0)
Alkaline Phosphatase: 130 U/L — ABNORMAL HIGH (ref 38–126)
Anion gap: 9 (ref 5–15)
BUN: 12 mg/dL (ref 8–23)
CO2: 25 mmol/L (ref 22–32)
Calcium: 8.7 mg/dL — ABNORMAL LOW (ref 8.9–10.3)
Chloride: 106 mmol/L (ref 98–111)
Creatinine: 0.74 mg/dL (ref 0.44–1.00)
GFR, Est AFR Am: >60 mL/min (ref >60)
GFR, Estimated: >60 mL/min (ref >60)
Glucose, Bld: 320 mg/dL — ABNORMAL HIGH (ref 70–99)
Potassium: 3.2 mmol/L — ABNORMAL LOW (ref 3.5–5.1)
Sodium: 140 mmol/L (ref 135–145)
Total Bilirubin: 0.5 mg/dL (ref 0.3–1.2)
Total Protein: 7.3 g/dL (ref 6.5–8.1)

## 2019-11-23 LAB — CBC WITH DIFFERENTIAL (CANCER CENTER ONLY)
Abs Immature Granulocytes: 0.03 10*3/uL (ref 0.00–0.07)
Basophils Absolute: 0 10*3/uL (ref 0.0–0.1)
Basophils Relative: 0 %
Eosinophils Absolute: 0 10*3/uL (ref 0.0–0.5)
Eosinophils Relative: 1 %
HCT: 28.2 % — ABNORMAL LOW (ref 36.0–46.0)
Hemoglobin: 9.4 g/dL — ABNORMAL LOW (ref 12.0–15.0)
Immature Granulocytes: 1 %
Lymphocytes Relative: 10 %
Lymphs Abs: 0.4 10*3/uL — ABNORMAL LOW (ref 0.7–4.0)
MCH: 33.2 pg (ref 26.0–34.0)
MCHC: 33.3 g/dL (ref 30.0–36.0)
MCV: 99.6 fL (ref 80.0–100.0)
Monocytes Absolute: 0.5 10*3/uL (ref 0.1–1.0)
Monocytes Relative: 15 %
Neutro Abs: 2.6 10*3/uL (ref 1.7–7.7)
Neutrophils Relative %: 73 %
Platelet Count: 214 10*3/uL (ref 150–400)
RBC: 2.83 MIL/uL — ABNORMAL LOW (ref 3.87–5.11)
RDW: 14.3 % (ref 11.5–15.5)
WBC Count: 3.6 10*3/uL — ABNORMAL LOW (ref 4.0–10.5)
nRBC: 0 % (ref 0.0–0.2)

## 2019-11-23 LAB — LACTATE DEHYDROGENASE: LDH: 228 U/L — ABNORMAL HIGH (ref 98–192)

## 2019-11-23 MED ORDER — DARATUMUMAB-HYALURONIDASE-FIHJ 1800-30000 MG-UT/15ML ~~LOC~~ SOLN
1800.0000 mg | Freq: Once | SUBCUTANEOUS | Status: AC
  Administered 2019-11-23: 10:00:00 1800 mg via SUBCUTANEOUS
  Filled 2019-11-23: qty 15

## 2019-11-23 MED ORDER — SODIUM CHLORIDE 0.9 % IV SOLN
Freq: Once | INTRAVENOUS | Status: AC
  Administered 2019-11-23: 09:00:00 via INTRAVENOUS
  Filled 2019-11-23: qty 250

## 2019-11-23 MED ORDER — TEMAZEPAM 15 MG PO CAPS: 15.0000 mg | ORAL_CAPSULE | Freq: Every evening | ORAL | 0 refills | Status: DC | PRN

## 2019-11-23 MED ORDER — SODIUM CHLORIDE 0.9 % IV SOLN
INTRAVENOUS | Status: DC
  Filled 2019-11-23: qty 250

## 2019-11-23 MED ORDER — ACETAMINOPHEN 325 MG PO TABS
650.0000 mg | ORAL_TABLET | Freq: Once | ORAL | Status: AC
  Administered 2019-11-23: 650 mg via ORAL

## 2019-11-23 MED ORDER — DIPHENHYDRAMINE HCL 25 MG PO CAPS
ORAL_CAPSULE | ORAL | Status: AC
  Filled 2019-11-23: qty 2

## 2019-11-23 MED ORDER — DEXAMETHASONE 4 MG PO TABS
ORAL_TABLET | ORAL | Status: AC
  Filled 2019-11-23: qty 5

## 2019-11-23 MED ORDER — DIPHENHYDRAMINE HCL 25 MG PO CAPS
50.0000 mg | ORAL_CAPSULE | Freq: Once | ORAL | Status: AC
  Administered 2019-11-23: 50 mg via ORAL

## 2019-11-23 MED ORDER — ACETAMINOPHEN 325 MG PO TABS
ORAL_TABLET | ORAL | Status: AC
  Filled 2019-11-23: qty 2

## 2019-11-23 MED ORDER — PROCHLORPERAZINE MALEATE 10 MG PO TABS: 10.0000 mg | ORAL_TABLET | Freq: Four times a day (QID) | ORAL | 2 refills | Status: DC | PRN

## 2019-11-23 MED ORDER — DEXAMETHASONE 4 MG PO TABS
20.0000 mg | ORAL_TABLET | Freq: Once | ORAL | Status: AC
  Administered 2019-11-23: 20 mg via ORAL

## 2019-11-23 MED ORDER — DEXTROSE 5 % IV SOLN
72.0000 mg/m2 | Freq: Once | INTRAVENOUS | Status: AC
  Administered 2019-11-23: 140 mg via INTRAVENOUS
  Filled 2019-11-23: qty 60

## 2019-11-23 NOTE — Progress Notes (Signed)
Hematology and Oncology Follow Up Visit  Vicki Moon [id number] Feb 11, 1951 68 y.o. 11/23/2019   Principle Diagnosis:  IgG Kappa myeloma -- 1p-, 13q-, 16q- and t(11:14)  Past Therapy: RVD -- start on 05/09/2019 -- d/c revlimid on 06/14/2019 -- d/c on 06/28/2019 S/P Kyphoplasty at L2  Current Therapy:   Kyprolis/Faspro -- start on 10/26/2019 -- s/p cycle #1 CyBorD -- started on 07/12/2019- s/p cycle 4 -- d/c on 10/19/2019 due to disease progression XRT to lumbar spine -- to be done in [place] Xgeva 120 mg sq q 3 months -- next dose on11/2020   Interim History:  Vicki Moon is here today for follow-up and treatment.  We change her treatment protocol because the myeloma seem to be resistant.  We now have her on Kyprolis with daratumumab.  Hopefully, we will see a response.  She is doing okay.  She is having some diarrhea.  She is really not taking anything for it.  She also has some nausea.  She supposed be taking some Compazine but she does not think that she has any.  I will have to call this in.  She now has an insulin pump on.  Her blood sugar this morning was 320.  This really is going be a problem for her and this also might be an issue with respect to trying to respond to her myeloma.  She has had no fever.  She has had no rashes.  She has had no headache.  After her last treatment, she had this weird symptom of numbness in the right hand.  She ultimately had an MRI of the brain which looked okay.  I am not sure as to why this would have happened.  This was transient.  Currently, her performance status is ECOG 1.     Medications:  Allergies as of 11/23/2019      Reactions   Metformin Nausea Only   "severe GI"      Medication List       Accurate as of November 23, 2019  8:32 AM. If you have any questions, ask your nurse or doctor.        ALPRAZolam 0.5 MG tablet Commonly known as: XANAX Take 0.5 mg by mouth at bedtime as needed.   atorvastatin 10 MG tablet  Commonly known as: LIPITOR Take 10 mg by mouth daily.   cyclobenzaprine 5 MG tablet Commonly known as: FLEXERIL Take 1 tablet (5 mg total) by mouth 3 (three) times daily as needed.   FreeStyle [employer] Use one sensor every 10 days.   glipiZIDE 10 MG 24 hr tablet Commonly known as: GLUCOTROL XL Take 10 mg by mouth daily.   HYDROcodone-acetaminophen 5-325 MG tablet Commonly known as: NORCO/VICODIN Take 1 tablet by mouth every 6 (six) hours as needed for moderate pain. Received from [facility] ED.   latanoprost 0.005 % ophthalmic solution Commonly known as: XALATAN Place 1 drop into both eyes at bedtime.   lisinopril 10 MG tablet Commonly known as: ZESTRIL Take 10 mg by mouth daily.   NovoLOG 100 UNIT/ML injection Generic drug: insulin aspart Inject 1.2-25 Units into the skin as directed. PATIENT USES INSULIN PUMP  Patient has a set basal rate of 1.2-1.5u per hour with meal time boluses of up to 25 units   [place] 1 Device by Does not apply route 3 (three) times a week. Patient put on insulin pump every 2 to 3 days for insulin delivery   PARoxetine  30 MG tablet Commonly known as: PAXIL Take 30 mg by mouth every morning.   potassium chloride SA 20 MEQ tablet Commonly known as: KLOR-CON Take 2 tablets (40 mEq total) by mouth daily.   PRESCRIPTION MEDICATION Onmipod dash-insulin pump.Changes every 2 days. 5-25units ac based on blood sugar.       Allergies:  Allergies  Allergen Reactions  . Metformin Nausea Only    "severe GI"    Past Medical History, Surgical history, Social history, and Family History were reviewed and updated.  Review of Systems: Review of Systems  Constitutional: Negative.   HENT: Negative.   Eyes: Negative.   Respiratory: Negative.   Cardiovascular: Negative.   Gastrointestinal: Negative.   Genitourinary: Negative.   Musculoskeletal: Negative.   Skin: Negative.   Neurological: Negative.    Endo/Heme/Allergies: Negative.   Psychiatric/Behavioral: Negative.      Physical Exam:  weight is 190 lb 12.8 oz (86.5 kg). Her oral temperature is 97.1 F (36.2 C) (abnormal). Her blood pressure is 156/63 (abnormal) and her pulse is 71. Her respiration is 20 and oxygen saturation is 100%.   Wt Readings from Last 3 Encounters:  11/23/19 190 lb 12.8 oz (86.5 kg)  11/03/19 189 lb (85.7 kg)  10/19/19 189 lb 1.9 oz (85.8 kg)    Physical Exam Vitals signs reviewed.  HENT:     Head: Normocephalic and atraumatic.  Eyes:     Pupils: Pupils are equal, round, and reactive to light.  Neck:     Musculoskeletal: Normal range of motion.  Cardiovascular:     Rate and Rhythm: Normal rate and regular rhythm.     Heart sounds: Normal heart sounds.  Pulmonary:     Effort: Pulmonary effort is normal.     Breath sounds: Normal breath sounds.  Abdominal:     General: Bowel sounds are normal.     Palpations: Abdomen is soft.  Musculoskeletal: Normal range of motion.        General: No tenderness or deformity.  Lymphadenopathy:     Cervical: No cervical adenopathy.  Skin:    General: Skin is warm and dry.     Findings: No erythema or rash.  Neurological:     Mental Status: She is alert and oriented to person, place, and time.  Psychiatric:        Behavior: Behavior normal.        Thought Content: Thought content normal.        Judgment: Judgment normal.      Lab Results  Component Value Date   WBC 3.6 (L) 11/23/2019   HGB 9.4 (L) 11/23/2019   HCT 28.2 (L) 11/23/2019   MCV 99.6 11/23/2019   PLT 214 11/23/2019   Lab Results  Component Value Date   FERRITIN 406 (H) 07/12/2019   IRON 125 07/12/2019   TIBC 283 07/12/2019   UIBC 157 07/12/2019   IRONPCTSAT 44 07/12/2019   Lab Results  Component Value Date   RBC 2.83 (L) 11/23/2019   Lab Results  Component Value Date   KPAFRELGTCHN 301.0 (H) 10/11/2019   LAMBDASER 6.2 10/11/2019   KAPLAMBRATIO 48.55 (H) 10/11/2019   Lab  Results  Component Value Date   IGGSERUM 2,410 (H) 10/19/2019   IGGSERUM 2,244 (H) 10/19/2019   IGA 25 (L) 10/19/2019   IGA 24 (L) 10/19/2019   IGMSERUM 29 10/19/2019   IGMSERUM 26 10/19/2019   Lab Results  Component Value Date   TOTALPROTELP 7.5 10/19/2019   TOTALPROTELP 7.3 10/19/2019  ALBUMINELP 3.4 10/11/2019   A1GS 0.2 10/11/2019   A2GS 0.9 10/11/2019   BETS 0.9 10/11/2019   GAMS 1.7 10/11/2019   MSPIKE 1.6 (H) 10/11/2019   SPEI Comment 10/11/2019     Chemistry      Component Value Date/Time   NA 140 11/23/2019 0800   K 3.2 (L) 11/23/2019 0800   CL 106 11/23/2019 0800   CO2 25 11/23/2019 0800   BUN 12 11/23/2019 0800   CREATININE 0.74 11/23/2019 0800   CREATININE 0.65 01/12/2018 1004      Component Value Date/Time   CALCIUM 8.7 (L) 11/23/2019 0800   ALKPHOS 130 (H) 11/23/2019 0800   AST 9 (L) 11/23/2019 0800   ALT 10 11/23/2019 0800   BILITOT 0.5 11/23/2019 0800       Impression and Plan: Ms. Karas is a very pleasant 69 yo caucasian female with IgG kappa myeloma.  I think that her cytogenetics place her in high risk group.   It will be very interesting to see what the myeloma levels look like.  Hopefully, we will start to see a response since we started her on the daratumumab/Kyprolis combination.  I does want her quality of life to be good.  I forgot to mention that she did have a very nice Thanksgiving.  It was quiet.  We will now plan to get her back in 1 month.  She will get treatment for the next 3 weeks.     [name], MD 12/2/20208:32 AM

## 2019-11-23 NOTE — Patient Instructions (Signed)
[facility] Discharge Instructions for Patients Receiving Chemotherapy  Today you received the following chemotherapy agents:  Kyprolis and Darzalex  To help prevent nausea and vomiting after your treatment, we encourage you to take your nausea medication as ordered per MD.    If you develop nausea and vomiting that is not controlled by your nausea medication, call the clinic.   BELOW ARE SYMPTOMS THAT SHOULD BE REPORTED IMMEDIATELY:  *FEVER GREATER THAN 100.5 F  *CHILLS WITH OR WITHOUT FEVER  NAUSEA AND VOMITING THAT IS NOT CONTROLLED WITH YOUR NAUSEA MEDICATION  *UNUSUAL SHORTNESS OF BREATH  *UNUSUAL BRUISING OR BLEEDING  TENDERNESS IN MOUTH AND THROAT WITH OR WITHOUT PRESENCE OF ULCERS  *URINARY PROBLEMS  *BOWEL PROBLEMS  UNUSUAL RASH Items with * indicate a potential emergency and should be followed up as soon as possible.  Feel free to call the clinic should you have any questions or concerns. The clinic phone number is [phone].  Please show the [facility] at check-in to the Emergency Department and triage nurse.

## 2019-11-23 NOTE — Telephone Encounter (Signed)
Appointments scheduled calendar printed by infusion per 12/2 los

## 2019-11-24 DIAGNOSIS — H401133 Primary open-angle glaucoma, bilateral, severe stage: Secondary | ICD-10-CM | POA: Diagnosis not present

## 2019-11-24 LAB — IGG, IGA, IGM
IgA: 12 mg/dL — ABNORMAL LOW (ref 87–352)
IgG (Immunoglobin G), Serum: 1500 mg/dL (ref 586–1602)
IgM (Immunoglobulin M), Srm: 14 mg/dL — ABNORMAL LOW (ref 26–217)

## 2019-11-24 LAB — KAPPA/LAMBDA LIGHT CHAINS
Kappa free light chain: 192.7 mg/L — ABNORMAL HIGH (ref 3.3–19.4)
Kappa, lambda light chain ratio: 68.82 — ABNORMAL HIGH (ref 0.26–1.65)
Lambda free light chains: 2.8 mg/L — ABNORMAL LOW (ref 5.7–26.3)

## 2019-11-25 LAB — PROTEIN ELECTROPHORESIS, SERUM, WITH REFLEX
A/G Ratio: 1.1 (ref 0.7–1.7)
Albumin ELP: 3.5 g/dL (ref 2.9–4.4)
Alpha-1-Globulin: 0.3 g/dL (ref 0.0–0.4)
Alpha-2-Globulin: 0.9 g/dL (ref 0.4–1.0)
Beta Globulin: 0.9 g/dL (ref 0.7–1.3)
Gamma Globulin: 1.3 g/dL (ref 0.4–1.8)
Globulin, Total: 3.3 g/dL (ref 2.2–3.9)
M-Spike, %: 1.2 g/dL — ABNORMAL HIGH
SPEP Interpretation: 0
Total Protein ELP: 6.8 g/dL (ref 6.0–8.5)

## 2019-11-25 LAB — IMMUNOFIXATION REFLEX, SERUM
IgA: 13 mg/dL — ABNORMAL LOW (ref 87–352)
IgG (Immunoglobin G), Serum: 1576 mg/dL (ref 586–1602)
IgM (Immunoglobulin M), Srm: 16 mg/dL — ABNORMAL LOW (ref 26–217)

## 2019-11-28 ENCOUNTER — Telehealth: Payer: Self-pay | Admitting: *Deleted

## 2019-11-28 NOTE — Telephone Encounter (Signed)
-----   Message from [name], MD sent at 11/25/2019  4:59 PM EST ----- Call - the myeloma is finally responding.  The level went from 1.6 down to 1.2!!!  Vicki Moon!!  Laurey Arrow

## 2019-11-28 NOTE — Telephone Encounter (Signed)
As noted below by Dr. [name], I informed patient of her myeloma lab results. She verbalized understanding.

## 2019-11-29 ENCOUNTER — Other Ambulatory Visit: Payer: Self-pay | Admitting: *Deleted

## 2019-11-29 DIAGNOSIS — C9000 Multiple myeloma not having achieved remission: Secondary | ICD-10-CM

## 2019-11-30 ENCOUNTER — Inpatient Hospital Stay: Payer: Medicare Other

## 2019-11-30 ENCOUNTER — Other Ambulatory Visit: Payer: Self-pay

## 2019-11-30 VITALS — BP 179/67 | HR 63 | Temp 97.1°F | Resp 18

## 2019-11-30 DIAGNOSIS — E782 Mixed hyperlipidemia: Secondary | ICD-10-CM | POA: Diagnosis not present

## 2019-11-30 DIAGNOSIS — R110 Nausea: Secondary | ICD-10-CM | POA: Diagnosis not present

## 2019-11-30 DIAGNOSIS — I10 Essential (primary) hypertension: Secondary | ICD-10-CM | POA: Diagnosis not present

## 2019-11-30 DIAGNOSIS — D649 Anemia, unspecified: Secondary | ICD-10-CM | POA: Diagnosis not present

## 2019-11-30 DIAGNOSIS — D500 Iron deficiency anemia secondary to blood loss (chronic): Secondary | ICD-10-CM | POA: Diagnosis not present

## 2019-11-30 DIAGNOSIS — E785 Hyperlipidemia, unspecified: Secondary | ICD-10-CM | POA: Diagnosis not present

## 2019-11-30 DIAGNOSIS — Z5111 Encounter for antineoplastic chemotherapy: Secondary | ICD-10-CM | POA: Diagnosis not present

## 2019-11-30 DIAGNOSIS — E1122 Type 2 diabetes mellitus with diabetic chronic kidney disease: Secondary | ICD-10-CM | POA: Diagnosis not present

## 2019-11-30 DIAGNOSIS — Z794 Long term (current) use of insulin: Secondary | ICD-10-CM | POA: Diagnosis not present

## 2019-11-30 DIAGNOSIS — E1169 Type 2 diabetes mellitus with other specified complication: Secondary | ICD-10-CM | POA: Diagnosis not present

## 2019-11-30 DIAGNOSIS — Z1321 Encounter for screening for nutritional disorder: Secondary | ICD-10-CM | POA: Diagnosis not present

## 2019-11-30 DIAGNOSIS — C9000 Multiple myeloma not having achieved remission: Secondary | ICD-10-CM | POA: Diagnosis not present

## 2019-11-30 DIAGNOSIS — R197 Diarrhea, unspecified: Secondary | ICD-10-CM | POA: Diagnosis not present

## 2019-11-30 DIAGNOSIS — E1165 Type 2 diabetes mellitus with hyperglycemia: Secondary | ICD-10-CM | POA: Diagnosis not present

## 2019-11-30 DIAGNOSIS — Z9641 Presence of insulin pump (external) (internal): Secondary | ICD-10-CM | POA: Diagnosis not present

## 2019-11-30 LAB — CBC WITH DIFFERENTIAL (CANCER CENTER ONLY)
Abs Immature Granulocytes: 0.04 10*3/uL (ref 0.00–0.07)
Basophils Absolute: 0 10*3/uL (ref 0.0–0.1)
Basophils Relative: 0 %
Eosinophils Absolute: 0 10*3/uL (ref 0.0–0.5)
Eosinophils Relative: 1 %
HCT: 26.7 % — ABNORMAL LOW (ref 36.0–46.0)
Hemoglobin: 8.8 g/dL — ABNORMAL LOW (ref 12.0–15.0)
Immature Granulocytes: 1 %
Lymphocytes Relative: 11 %
Lymphs Abs: 0.4 10*3/uL — ABNORMAL LOW (ref 0.7–4.0)
MCH: 33 pg (ref 26.0–34.0)
MCHC: 33 g/dL (ref 30.0–36.0)
MCV: 100 fL (ref 80.0–100.0)
Monocytes Absolute: 0.6 10*3/uL (ref 0.1–1.0)
Monocytes Relative: 16 %
Neutro Abs: 2.4 10*3/uL (ref 1.7–7.7)
Neutrophils Relative %: 71 %
Platelet Count: 109 10*3/uL — ABNORMAL LOW (ref 150–400)
RBC: 2.67 MIL/uL — ABNORMAL LOW (ref 3.87–5.11)
RDW: 14.3 % (ref 11.5–15.5)
WBC Count: 3.4 10*3/uL — ABNORMAL LOW (ref 4.0–10.5)
nRBC: 0.6 % — ABNORMAL HIGH (ref 0.0–0.2)

## 2019-11-30 LAB — CMP (CANCER CENTER ONLY)
ALT: 9 U/L (ref 0–44)
AST: 8 U/L — ABNORMAL LOW (ref 15–41)
Albumin: 3.7 g/dL (ref 3.5–5.0)
Alkaline Phosphatase: 99 U/L (ref 38–126)
Anion gap: 10 (ref 5–15)
BUN: 10 mg/dL (ref 8–23)
CO2: 26 mmol/L (ref 22–32)
Calcium: 8.8 mg/dL — ABNORMAL LOW (ref 8.9–10.3)
Chloride: 106 mmol/L (ref 98–111)
Creatinine: 0.81 mg/dL (ref 0.44–1.00)
GFR, Est AFR Am: >60 mL/min (ref >60)
GFR, Estimated: >60 mL/min (ref >60)
Glucose, Bld: 281 mg/dL — ABNORMAL HIGH (ref 70–99)
Potassium: 3 mmol/L — CL (ref 3.5–5.1)
Sodium: 142 mmol/L (ref 135–145)
Total Bilirubin: 0.5 mg/dL (ref 0.3–1.2)
Total Protein: 6.7 g/dL (ref 6.5–8.1)

## 2019-11-30 MED ORDER — ACETAMINOPHEN 325 MG PO TABS
ORAL_TABLET | ORAL | Status: AC
  Filled 2019-11-30: qty 2

## 2019-11-30 MED ORDER — ONDANSETRON HCL 8 MG PO TABS: 8.0000 mg | ORAL_TABLET | Freq: Three times a day (TID) | ORAL | 0 refills | Status: DC | PRN

## 2019-11-30 MED ORDER — DIPHENHYDRAMINE HCL 25 MG PO CAPS
ORAL_CAPSULE | ORAL | Status: AC
  Filled 2019-11-30: qty 1

## 2019-11-30 MED ORDER — DEXAMETHASONE 4 MG PO TABS
20.0000 mg | ORAL_TABLET | Freq: Once | ORAL | Status: AC
  Administered 2019-11-30: 20 mg via ORAL

## 2019-11-30 MED ORDER — DEXAMETHASONE 4 MG PO TABS
ORAL_TABLET | ORAL | Status: AC
  Filled 2019-11-30: qty 5

## 2019-11-30 MED ORDER — SODIUM CHLORIDE 0.9 % IV SOLN
Freq: Once | INTRAVENOUS | Status: AC
  Administered 2019-11-30: 09:00:00 via INTRAVENOUS
  Filled 2019-11-30: qty 250

## 2019-11-30 MED ORDER — POTASSIUM CHLORIDE CRYS ER 20 MEQ PO TBCR
40.0000 meq | EXTENDED_RELEASE_TABLET | Freq: Once | ORAL | Status: AC
  Administered 2019-11-30: 09:00:00 40 meq via ORAL
  Filled 2019-11-30: qty 2

## 2019-11-30 MED ORDER — DARATUMUMAB-HYALURONIDASE-FIHJ 1800-30000 MG-UT/15ML ~~LOC~~ SOLN
1800.0000 mg | Freq: Once | SUBCUTANEOUS | Status: AC
  Administered 2019-11-30: 10:00:00 1800 mg via SUBCUTANEOUS
  Filled 2019-11-30: qty 15

## 2019-11-30 MED ORDER — DEXTROSE 5 % IV SOLN
72.0000 mg/m2 | Freq: Once | INTRAVENOUS | Status: AC
  Administered 2019-11-30: 140 mg via INTRAVENOUS
  Filled 2019-11-30: qty 60

## 2019-11-30 MED ORDER — DIPHENHYDRAMINE HCL 25 MG PO CAPS
50.0000 mg | ORAL_CAPSULE | Freq: Once | ORAL | Status: AC
  Administered 2019-11-30: 25 mg via ORAL

## 2019-11-30 MED ORDER — ACETAMINOPHEN 325 MG PO TABS
650.0000 mg | ORAL_TABLET | Freq: Once | ORAL | Status: AC
  Administered 2019-11-30: 09:00:00 650 mg via ORAL

## 2019-11-30 NOTE — Patient Instructions (Signed)
Carfilzomib injection What is this medicine? CARFILZOMIB (kar FILZ oh mib) targets a specific protein within cancer cells and stops the cancer cells from growing. It is used to treat multiple myeloma. This medicine may be used for other purposes; ask your health care provider or pharmacist if you have questions. COMMON BRAND NAME(S): KYPROLIS What should I tell my health care provider before I take this medicine? They need to know if you have any of these conditions:  heart disease  history of blood clots  irregular heartbeat  kidney disease  liver disease  lung or breathing disease  an unusual or allergic reaction to carfilzomib, or other medicines, foods, dyes, or preservatives  pregnant or trying to get pregnant  breast-feeding How should I use this medicine? This medicine is for injection or infusion into a vein. It is given by a health care professional in a hospital or clinic setting. Talk to your pediatrician regarding the use of this medicine in children. Special care may be needed. Overdosage: If you think you have taken too much of this medicine contact a poison control center or emergency room at once. NOTE: This medicine is only for you. Do not share this medicine with others. What if I miss a dose? It is important not to miss your dose. Call your doctor or health care professional if you are unable to keep an appointment. What may interact with this medicine? Interactions are not expected. Give your health care provider a list of all the medicines, herbs, non-prescription drugs, or dietary supplements you use. Also tell them if you smoke, drink alcohol, or use illegal drugs. Some items may interact with your medicine. This list may not describe all possible interactions. Give your health care provider a list of all the medicines, herbs, non-prescription drugs, or dietary supplements you use. Also tell them if you smoke, drink alcohol, or use illegal drugs. Some items  may interact with your medicine. What should I watch for while using this medicine? Your condition will be monitored carefully while you are receiving this medicine. Report any side effects. Continue your course of treatment even though you feel ill unless your doctor tells you to stop. You may need blood work done while you are taking this medicine. Do not become pregnant while taking this medicine or for at least 6 months after stopping it. Women should inform their doctor if they wish to become pregnant or think they might be pregnant. There is a potential for serious side effects to an unborn child. Men should not father a child while taking this medicine and for at least 3 months after stopping it. Talk to your health care professional or pharmacist for more information. Do not breast-feed an infant while taking this medicine or for 2 weeks after the last dose. Check with your doctor or health care professional if you get an attack of severe diarrhea, nausea and vomiting, or if you sweat a lot. The loss of too much body fluid can make it dangerous for you to take this medicine. You may get dizzy. Do not drive, use machinery, or do anything that needs mental alertness until you know how this medicine affects you. Do not stand or sit up quickly, especially if you are an older patient. This reduces the risk of dizzy or fainting spells. What side effects may I notice from receiving this medicine? Side effects that you should report to your doctor or health care professional as soon as possible:  allergic reactions like skin  rash, itching or hives, swelling of the face, lips, or tongue  confusion  dizziness  feeling faint or lightheaded  fever or chills  palpitations  seizures  signs and symptoms of bleeding such as bloody or black, tarry stools; red or dark-brown urine; spitting up blood or brown material that looks like coffee grounds; red spots on the skin; unusual bruising or bleeding  including from the eye, gums, or nose  signs and symptoms of a blood clot such as breathing problems; changes in vision; chest pain; severe, sudden headache; pain, swelling, warmth in the leg; trouble speaking; sudden numbness or weakness of the face, arm or leg  signs and symptoms of kidney injury like trouble passing urine or change in the amount of urine  signs and symptoms of liver injury like dark yellow or brown urine; general ill feeling or flu-like symptoms; light-colored stools; loss of appetite; nausea; right upper belly pain; unusually weak or tired; yellowing of the eyes or skin Side effects that usually do not require medical attention (report to your doctor or health care professional if they continue or are bothersome):  back pain  cough  diarrhea  headache  muscle cramps  vomiting This list may not describe all possible side effects. Call your doctor for medical advice about side effects. You may report side effects to FDA at 1-800-FDA-1088. Where should I keep my medicine? This drug is given in a hospital or clinic and will not be stored at home. NOTE: This sheet is a summary. It may not cover all possible information. If you have questions about this medicine, talk to your doctor, pharmacist, or health care provider.  2020 Elsevier/Gold Standard (2017-09-23 14:07:13) Daratumumab injection What is this medicine? DARATUMUMAB (dar a toom ue mab) is a monoclonal antibody. It is used to treat multiple myeloma. This medicine may be used for other purposes; ask your health care provider or pharmacist if you have questions. COMMON BRAND NAME(S): DARZALEX What should I tell my health care provider before I take this medicine? They need to know if you have any of these conditions:  infection (especially a virus infection such as chickenpox, herpes, or hepatitis B virus)  lung or breathing disease  an unusual or allergic reaction to daratumumab, other medicines, foods,  dyes, or preservatives  pregnant or trying to get pregnant  breast-feeding How should I use this medicine? This medicine is for infusion into a vein. It is given by a health care professional in a hospital or clinic setting. Talk to your pediatrician regarding the use of this medicine in children. Special care may be needed. Overdosage: If you think you have taken too much of this medicine contact a poison control center or emergency room at once. NOTE: This medicine is only for you. Do not share this medicine with others. What if I miss a dose? Keep appointments for follow-up doses as directed. It is important not to miss your dose. Call your doctor or health care professional if you are unable to keep an appointment. What may interact with this medicine? Interactions have not been studied. This list may not describe all possible interactions. Give your health care provider a list of all the medicines, herbs, non-prescription drugs, or dietary supplements you use. Also tell them if you smoke, drink alcohol, or use illegal drugs. Some items may interact with your medicine. What should I watch for while using this medicine? This drug may make you feel generally unwell. Report any side effects. Continue your course  of treatment even though you feel ill unless your doctor tells you to stop. This medicine can cause serious allergic reactions. To reduce your risk you may need to take medicine before treatment with this medicine. Take your medicine as directed. This medicine can affect the results of blood tests to match your blood type. These changes can last for up to 6 months after the final dose. Your healthcare provider will do blood tests to match your blood type before you start treatment. Tell all of your healthcare providers that you are being treated with this medicine before receiving a blood transfusion. This medicine can affect the results of some tests used to determine treatment response;  extra tests may be needed to evaluate response. Do not become pregnant while taking this medicine or for 3 months after stopping it. Women should inform their doctor if they wish to become pregnant or think they might be pregnant. There is a potential for serious side effects to an unborn child. Talk to your health care professional or pharmacist for more information. What side effects may I notice from receiving this medicine? Side effects that you should report to your doctor or health care professional as soon as possible:  allergic reactions like skin rash, itching or hives, swelling of the face, lips, or tongue  breathing problems  chills  cough  dizziness  feeling faint or lightheaded  headache  low blood counts - this medicine may decrease the number of white blood cells, red blood cells and platelets. You may be at increased risk for infections and bleeding.  nausea, vomiting  shortness of breath  signs of decreased platelets or bleeding - bruising, pinpoint red spots on the skin, black, tarry stools, blood in the urine  signs of decreased red blood cells - unusually weak or tired, feeling faint or lightheaded, falls  signs of infection - fever or chills, cough, sore throat, pain or difficulty passing urine  signs and symptoms of liver injury like dark yellow or brown urine; general ill feeling or flu-like symptoms; light-colored stools; loss of appetite; right upper belly pain; unusually weak or tired; yellowing of the eyes or skin Side effects that usually do not require medical attention (report to your doctor or health care professional if they continue or are bothersome):  back pain  constipation  loss of appetite  diarrhea  joint pain  muscle cramps  pain, tingling, numbness in the hands or feet  swelling of the ankles, feet, hands  tiredness  trouble sleeping This list may not describe all possible side effects. Call your doctor for medical advice  about side effects. You may report side effects to FDA at 1-800-FDA-1088. Where should I keep my medicine? Keep out of the reach of children. This drug is given in a hospital or clinic and will not be stored at home. NOTE: This sheet is a summary. It may not cover all possible information. If you have questions about this medicine, talk to your doctor, pharmacist, or health care provider.  2020 Elsevier/Gold Standard (2018-09-23 14:00:48)

## 2019-12-05 DIAGNOSIS — E1122 Type 2 diabetes mellitus with diabetic chronic kidney disease: Secondary | ICD-10-CM | POA: Diagnosis not present

## 2019-12-05 DIAGNOSIS — F17210 Nicotine dependence, cigarettes, uncomplicated: Secondary | ICD-10-CM | POA: Diagnosis not present

## 2019-12-05 DIAGNOSIS — D72819 Decreased white blood cell count, unspecified: Secondary | ICD-10-CM | POA: Diagnosis not present

## 2019-12-05 DIAGNOSIS — D649 Anemia, unspecified: Secondary | ICD-10-CM | POA: Diagnosis not present

## 2019-12-05 DIAGNOSIS — C9000 Multiple myeloma not having achieved remission: Secondary | ICD-10-CM | POA: Diagnosis not present

## 2019-12-05 DIAGNOSIS — E669 Obesity, unspecified: Secondary | ICD-10-CM | POA: Diagnosis not present

## 2019-12-05 DIAGNOSIS — E1165 Type 2 diabetes mellitus with hyperglycemia: Secondary | ICD-10-CM | POA: Diagnosis not present

## 2019-12-05 DIAGNOSIS — F331 Major depressive disorder, recurrent, moderate: Secondary | ICD-10-CM | POA: Diagnosis not present

## 2019-12-05 DIAGNOSIS — E876 Hypokalemia: Secondary | ICD-10-CM | POA: Diagnosis not present

## 2019-12-05 DIAGNOSIS — I129 Hypertensive chronic kidney disease with stage 1 through stage 4 chronic kidney disease, or unspecified chronic kidney disease: Secondary | ICD-10-CM | POA: Diagnosis not present

## 2019-12-05 DIAGNOSIS — K219 Gastro-esophageal reflux disease without esophagitis: Secondary | ICD-10-CM | POA: Diagnosis not present

## 2019-12-05 DIAGNOSIS — E782 Mixed hyperlipidemia: Secondary | ICD-10-CM | POA: Diagnosis not present

## 2019-12-06 ENCOUNTER — Other Ambulatory Visit: Payer: Self-pay | Admitting: *Deleted

## 2019-12-06 DIAGNOSIS — C9000 Multiple myeloma not having achieved remission: Secondary | ICD-10-CM

## 2019-12-07 ENCOUNTER — Telehealth: Payer: Self-pay | Admitting: *Deleted

## 2019-12-07 ENCOUNTER — Other Ambulatory Visit: Payer: Self-pay

## 2019-12-07 ENCOUNTER — Inpatient Hospital Stay: Payer: Medicare Other

## 2019-12-07 VITALS — BP 173/56 | HR 80 | Temp 96.7°F | Resp 18

## 2019-12-07 DIAGNOSIS — R197 Diarrhea, unspecified: Secondary | ICD-10-CM | POA: Diagnosis not present

## 2019-12-07 DIAGNOSIS — C9002 Multiple myeloma in relapse: Secondary | ICD-10-CM

## 2019-12-07 DIAGNOSIS — Z794 Long term (current) use of insulin: Secondary | ICD-10-CM | POA: Diagnosis not present

## 2019-12-07 DIAGNOSIS — C9000 Multiple myeloma not having achieved remission: Secondary | ICD-10-CM | POA: Diagnosis not present

## 2019-12-07 DIAGNOSIS — R110 Nausea: Secondary | ICD-10-CM | POA: Diagnosis not present

## 2019-12-07 DIAGNOSIS — Z9641 Presence of insulin pump (external) (internal): Secondary | ICD-10-CM | POA: Diagnosis not present

## 2019-12-07 DIAGNOSIS — E876 Hypokalemia: Secondary | ICD-10-CM

## 2019-12-07 DIAGNOSIS — Z5111 Encounter for antineoplastic chemotherapy: Secondary | ICD-10-CM | POA: Diagnosis not present

## 2019-12-07 LAB — CBC WITH DIFFERENTIAL (CANCER CENTER ONLY)
Abs Immature Granulocytes: 0.05 10*3/uL (ref 0.00–0.07)
Basophils Absolute: 0 10*3/uL (ref 0.0–0.1)
Basophils Relative: 0 %
Eosinophils Absolute: 0.1 10*3/uL (ref 0.0–0.5)
Eosinophils Relative: 1 %
HCT: 27.6 % — ABNORMAL LOW (ref 36.0–46.0)
Hemoglobin: 9.2 g/dL — ABNORMAL LOW (ref 12.0–15.0)
Immature Granulocytes: 1 %
Lymphocytes Relative: 8 %
Lymphs Abs: 0.4 10*3/uL — ABNORMAL LOW (ref 0.7–4.0)
MCH: 33 pg (ref 26.0–34.0)
MCHC: 33.3 g/dL (ref 30.0–36.0)
MCV: 98.9 fL (ref 80.0–100.0)
Monocytes Absolute: 0.5 10*3/uL (ref 0.1–1.0)
Monocytes Relative: 12 %
Neutro Abs: 3.3 10*3/uL (ref 1.7–7.7)
Neutrophils Relative %: 78 %
Platelet Count: 111 10*3/uL — ABNORMAL LOW (ref 150–400)
RBC: 2.79 MIL/uL — ABNORMAL LOW (ref 3.87–5.11)
RDW: 14.8 % (ref 11.5–15.5)
WBC Count: 4.3 10*3/uL (ref 4.0–10.5)
nRBC: 0.5 % — ABNORMAL HIGH (ref 0.0–0.2)

## 2019-12-07 LAB — COMPREHENSIVE METABOLIC PANEL
ALT: 9 U/L (ref 0–44)
AST: 8 U/L — ABNORMAL LOW (ref 15–41)
Albumin: 3.8 g/dL (ref 3.5–5.0)
Alkaline Phosphatase: 106 U/L (ref 38–126)
Anion gap: 8 (ref 5–15)
BUN: 10 mg/dL (ref 8–23)
CO2: 26 mmol/L (ref 22–32)
Calcium: 8.5 mg/dL — ABNORMAL LOW (ref 8.9–10.3)
Chloride: 107 mmol/L (ref 98–111)
Creatinine, Ser: 0.75 mg/dL (ref 0.44–1.00)
GFR calc Af Amer: >60 mL/min (ref >60)
GFR calc non Af Amer: >60 mL/min (ref >60)
Glucose, Bld: 246 mg/dL — ABNORMAL HIGH (ref 70–99)
Potassium: 3 mmol/L — CL (ref 3.5–5.1)
Sodium: 141 mmol/L (ref 135–145)
Total Bilirubin: 0.6 mg/dL (ref 0.3–1.2)
Total Protein: 6.6 g/dL (ref 6.5–8.1)

## 2019-12-07 MED ORDER — DIPHENHYDRAMINE HCL 25 MG PO CAPS
ORAL_CAPSULE | ORAL | Status: AC
  Filled 2019-12-07: qty 2

## 2019-12-07 MED ORDER — SODIUM CHLORIDE 0.9 % IV SOLN
Freq: Once | INTRAVENOUS | Status: AC
  Filled 2019-12-07: qty 250

## 2019-12-07 MED ORDER — DENOSUMAB 120 MG/1.7ML ~~LOC~~ SOLN
SUBCUTANEOUS | Status: AC
  Filled 2019-12-07: qty 1.7

## 2019-12-07 MED ORDER — DENOSUMAB 120 MG/1.7ML ~~LOC~~ SOLN
120.0000 mg | Freq: Once | SUBCUTANEOUS | Status: AC
  Administered 2019-12-07: 120 mg via SUBCUTANEOUS

## 2019-12-07 MED ORDER — DARATUMUMAB-HYALURONIDASE-FIHJ 1800-30000 MG-UT/15ML ~~LOC~~ SOLN
1800.0000 mg | Freq: Once | SUBCUTANEOUS | Status: AC
  Administered 2019-12-07: 1800 mg via SUBCUTANEOUS
  Filled 2019-12-07: qty 15

## 2019-12-07 MED ORDER — DEXTROSE 5 % IV SOLN
72.0000 mg/m2 | Freq: Once | INTRAVENOUS | Status: AC
  Administered 2019-12-07: 140 mg via INTRAVENOUS
  Filled 2019-12-07: qty 60

## 2019-12-07 MED ORDER — POTASSIUM CHLORIDE CRYS ER 20 MEQ PO TBCR
40.0000 meq | EXTENDED_RELEASE_TABLET | Freq: Once | ORAL | Status: AC
  Administered 2019-12-07: 40 meq via ORAL
  Filled 2019-12-07: qty 2

## 2019-12-07 MED ORDER — ACETAMINOPHEN 325 MG PO TABS
ORAL_TABLET | ORAL | Status: AC
  Filled 2019-12-07: qty 2

## 2019-12-07 MED ORDER — ACETAMINOPHEN 325 MG PO TABS
650.0000 mg | ORAL_TABLET | Freq: Once | ORAL | Status: AC
  Administered 2019-12-07: 650 mg via ORAL

## 2019-12-07 MED ORDER — DEXAMETHASONE 4 MG PO TABS
20.0000 mg | ORAL_TABLET | Freq: Once | ORAL | Status: AC
  Administered 2019-12-07: 20 mg via ORAL

## 2019-12-07 MED ORDER — DIPHENHYDRAMINE HCL 25 MG PO CAPS
50.0000 mg | ORAL_CAPSULE | Freq: Once | ORAL | Status: AC
  Administered 2019-12-07: 50 mg via ORAL

## 2019-12-07 MED ORDER — DEXAMETHASONE 4 MG PO TABS
ORAL_TABLET | ORAL | Status: AC
  Filled 2019-12-07: qty 5

## 2019-12-07 NOTE — Patient Instructions (Signed)
Carfilzomib injection What is this medicine? CARFILZOMIB (kar FILZ oh mib) targets a specific protein within cancer cells and stops the cancer cells from growing. It is used to treat multiple myeloma. This medicine may be used for other purposes; ask your health care provider or pharmacist if you have questions. COMMON BRAND NAME(S): KYPROLIS What should I tell my health care provider before I take this medicine? They need to know if you have any of these conditions:  heart disease  history of blood clots  irregular heartbeat  kidney disease  liver disease  lung or breathing disease  an unusual or allergic reaction to carfilzomib, or other medicines, foods, dyes, or preservatives  pregnant or trying to get pregnant  breast-feeding How should I use this medicine? This medicine is for injection or infusion into a vein. It is given by a health care professional in a hospital or clinic setting. Talk to your pediatrician regarding the use of this medicine in children. Special care may be needed. Overdosage: If you think you have taken too much of this medicine contact a poison control center or emergency room at once. NOTE: This medicine is only for you. Do not share this medicine with others. What if I miss a dose? It is important not to miss your dose. Call your doctor or health care professional if you are unable to keep an appointment. What may interact with this medicine? Interactions are not expected. Give your health care provider a list of all the medicines, herbs, non-prescription drugs, or dietary supplements you use. Also tell them if you smoke, drink alcohol, or use illegal drugs. Some items may interact with your medicine. This list may not describe all possible interactions. Give your health care provider a list of all the medicines, herbs, non-prescription drugs, or dietary supplements you use. Also tell them if you smoke, drink alcohol, or use illegal drugs. Some items  may interact with your medicine. What should I watch for while using this medicine? Your condition will be monitored carefully while you are receiving this medicine. Report any side effects. Continue your course of treatment even though you feel ill unless your doctor tells you to stop. You may need blood work done while you are taking this medicine. Do not become pregnant while taking this medicine or for at least 6 months after stopping it. Women should inform their doctor if they wish to become pregnant or think they might be pregnant. There is a potential for serious side effects to an unborn child. Men should not father a child while taking this medicine and for at least 3 months after stopping it. Talk to your health care professional or pharmacist for more information. Do not breast-feed an infant while taking this medicine or for 2 weeks after the last dose. Check with your doctor or health care professional if you get an attack of severe diarrhea, nausea and vomiting, or if you sweat a lot. The loss of too much body fluid can make it dangerous for you to take this medicine. You may get dizzy. Do not drive, use machinery, or do anything that needs mental alertness until you know how this medicine affects you. Do not stand or sit up quickly, especially if you are an older patient. This reduces the risk of dizzy or fainting spells. What side effects may I notice from receiving this medicine? Side effects that you should report to your doctor or health care professional as soon as possible:  allergic reactions like skin  rash, itching or hives, swelling of the face, lips, or tongue  confusion  dizziness  feeling faint or lightheaded  fever or chills  palpitations  seizures  signs and symptoms of bleeding such as bloody or black, tarry stools; red or dark-brown urine; spitting up blood or brown material that looks like coffee grounds; red spots on the skin; unusual bruising or bleeding  including from the eye, gums, or nose  signs and symptoms of a blood clot such as breathing problems; changes in vision; chest pain; severe, sudden headache; pain, swelling, warmth in the leg; trouble speaking; sudden numbness or weakness of the face, arm or leg  signs and symptoms of kidney injury like trouble passing urine or change in the amount of urine  signs and symptoms of liver injury like dark yellow or brown urine; general ill feeling or flu-like symptoms; light-colored stools; loss of appetite; nausea; right upper belly pain; unusually weak or tired; yellowing of the eyes or skin Side effects that usually do not require medical attention (report to your doctor or health care professional if they continue or are bothersome):  back pain  cough  diarrhea  headache  muscle cramps  vomiting This list may not describe all possible side effects. Call your doctor for medical advice about side effects. You may report side effects to FDA at 1-800-FDA-1088. Where should I keep my medicine? This drug is given in a hospital or clinic and will not be stored at home. NOTE: This sheet is a summary. It may not cover all possible information. If you have questions about this medicine, talk to your doctor, pharmacist, or health care provider.  2020 Elsevier/Gold Standard (2017-09-23 14:07:13)  

## 2019-12-07 NOTE — Progress Notes (Signed)
Ok to give [place] today with Ca level 8.5 per Dr. [name].

## 2019-12-07 NOTE — Telephone Encounter (Signed)
Dr. [name] notified of K-3.0. Order received for pt to get additional 40 meq of potassium this morning while in infusion room and to take additional 40 meq of potassium when she gets home today.  Pt notified of MD order and verbalizes an understanding.

## 2019-12-21 ENCOUNTER — Inpatient Hospital Stay (HOSPITAL_BASED_OUTPATIENT_CLINIC_OR_DEPARTMENT_OTHER): Payer: Medicare Other | Admitting: Hematology & Oncology

## 2019-12-21 ENCOUNTER — Other Ambulatory Visit: Payer: Self-pay

## 2019-12-21 ENCOUNTER — Inpatient Hospital Stay: Payer: Medicare Other

## 2019-12-21 VITALS — BP 169/64 | HR 81 | Temp 97.3°F | Resp 19 | Wt 186.8 lb

## 2019-12-21 VITALS — BP 154/61 | HR 62 | Temp 97.3°F | Resp 20

## 2019-12-21 DIAGNOSIS — Z794 Long term (current) use of insulin: Secondary | ICD-10-CM | POA: Diagnosis not present

## 2019-12-21 DIAGNOSIS — C9000 Multiple myeloma not having achieved remission: Secondary | ICD-10-CM

## 2019-12-21 DIAGNOSIS — D500 Iron deficiency anemia secondary to blood loss (chronic): Secondary | ICD-10-CM

## 2019-12-21 DIAGNOSIS — Z9641 Presence of insulin pump (external) (internal): Secondary | ICD-10-CM | POA: Diagnosis not present

## 2019-12-21 DIAGNOSIS — R197 Diarrhea, unspecified: Secondary | ICD-10-CM | POA: Diagnosis not present

## 2019-12-21 DIAGNOSIS — R110 Nausea: Secondary | ICD-10-CM | POA: Diagnosis not present

## 2019-12-21 DIAGNOSIS — Z5111 Encounter for antineoplastic chemotherapy: Secondary | ICD-10-CM | POA: Diagnosis not present

## 2019-12-21 LAB — CMP (CANCER CENTER ONLY)
ALT: 9 U/L (ref 0–44)
AST: 11 U/L — ABNORMAL LOW (ref 15–41)
Albumin: 3.6 g/dL (ref 3.5–5.0)
Alkaline Phosphatase: 112 U/L (ref 38–126)
Anion gap: 8 (ref 5–15)
BUN: 9 mg/dL (ref 8–23)
CO2: 27 mmol/L (ref 22–32)
Calcium: 8.5 mg/dL — ABNORMAL LOW (ref 8.9–10.3)
Chloride: 107 mmol/L (ref 98–111)
Creatinine: 0.79 mg/dL (ref 0.44–1.00)
GFR, Est AFR Am: >60 mL/min (ref >60)
GFR, Estimated: >60 mL/min (ref >60)
Glucose, Bld: 257 mg/dL — ABNORMAL HIGH (ref 70–99)
Potassium: 3.5 mmol/L (ref 3.5–5.1)
Sodium: 142 mmol/L (ref 135–145)
Total Bilirubin: 0.5 mg/dL (ref 0.3–1.2)
Total Protein: 6.5 g/dL (ref 6.5–8.1)

## 2019-12-21 LAB — CBC WITH DIFFERENTIAL (CANCER CENTER ONLY)
Abs Immature Granulocytes: 0.03 10*3/uL (ref 0.00–0.07)
Basophils Absolute: 0 10*3/uL (ref 0.0–0.1)
Basophils Relative: 1 %
Eosinophils Absolute: 0.1 10*3/uL (ref 0.0–0.5)
Eosinophils Relative: 2 %
HCT: 28.2 % — ABNORMAL LOW (ref 36.0–46.0)
Hemoglobin: 8.8 g/dL — ABNORMAL LOW (ref 12.0–15.0)
Immature Granulocytes: 1 %
Lymphocytes Relative: 10 %
Lymphs Abs: 0.4 10*3/uL — ABNORMAL LOW (ref 0.7–4.0)
MCH: 32 pg (ref 26.0–34.0)
MCHC: 31.2 g/dL (ref 30.0–36.0)
MCV: 102.5 fL — ABNORMAL HIGH (ref 80.0–100.0)
Monocytes Absolute: 0.4 10*3/uL (ref 0.1–1.0)
Monocytes Relative: 11 %
Neutro Abs: 2.6 10*3/uL (ref 1.7–7.7)
Neutrophils Relative %: 75 %
Platelet Count: 221 10*3/uL (ref 150–400)
RBC: 2.75 MIL/uL — ABNORMAL LOW (ref 3.87–5.11)
RDW: 15.5 % (ref 11.5–15.5)
WBC Count: 3.4 10*3/uL — ABNORMAL LOW (ref 4.0–10.5)
nRBC: 0 % (ref 0.0–0.2)

## 2019-12-21 LAB — LACTATE DEHYDROGENASE: LDH: 275 U/L — ABNORMAL HIGH (ref 98–192)

## 2019-12-21 MED ORDER — ACETAMINOPHEN 325 MG PO TABS
ORAL_TABLET | ORAL | Status: AC
  Filled 2019-12-21: qty 2

## 2019-12-21 MED ORDER — ACETAMINOPHEN 325 MG PO TABS
650.0000 mg | ORAL_TABLET | Freq: Once | ORAL | Status: AC
  Administered 2019-12-21: 650 mg via ORAL

## 2019-12-21 MED ORDER — DIPHENHYDRAMINE HCL 25 MG PO CAPS
ORAL_CAPSULE | ORAL | Status: AC
  Filled 2019-12-21: qty 2

## 2019-12-21 MED ORDER — SODIUM CHLORIDE 0.9 % IV SOLN
Freq: Once | INTRAVENOUS | Status: AC
  Filled 2019-12-21: qty 250

## 2019-12-21 MED ORDER — DIPHENHYDRAMINE HCL 25 MG PO CAPS
50.0000 mg | ORAL_CAPSULE | Freq: Once | ORAL | Status: AC
  Administered 2019-12-21: 09:00:00 50 mg via ORAL

## 2019-12-21 MED ORDER — DEXTROSE 5 % IV SOLN
72.0000 mg/m2 | Freq: Once | INTRAVENOUS | Status: AC
  Administered 2019-12-21: 10:00:00 140 mg via INTRAVENOUS
  Filled 2019-12-21: qty 60

## 2019-12-21 MED ORDER — DEXAMETHASONE 4 MG PO TABS
20.0000 mg | ORAL_TABLET | Freq: Once | ORAL | Status: AC
  Administered 2019-12-21: 20 mg via ORAL

## 2019-12-21 MED ORDER — DARATUMUMAB-HYALURONIDASE-FIHJ 1800-30000 MG-UT/15ML ~~LOC~~ SOLN
1800.0000 mg | Freq: Once | SUBCUTANEOUS | Status: AC
  Administered 2019-12-21: 10:00:00 1800 mg via SUBCUTANEOUS
  Filled 2019-12-21: qty 15

## 2019-12-21 NOTE — Progress Notes (Signed)
Hematology and Oncology Follow Up Visit  Vicki Moon [id number] December 10, 1951 68 y.o. 12/21/2019   Principle Diagnosis:  IgG Kappa myeloma -- 1p-, 13q-, 16q- and t(11:14)  Past Therapy: RVD -- start on 05/09/2019 -- d/c revlimid on 06/14/2019 -- d/c on 06/28/2019 S/P Kyphoplasty at L2  Current Therapy:   Kyprolis/Faspro -- start on 10/26/2019 -- s/p cycle #2 CyBorD -- started on 07/12/2019- s/p cycle 4 -- d/c on 10/19/2019 due to disease progression XRT to lumbar spine -- to be done in [place] Xgeva 120 mg sq q 3 months -- next dose on11/2020   Interim History:  Ms. Vicki Moon is here today for follow-up and treatment.  I am thankful that she is responding to treatment.  Clearly, the daratumumab has been the critical ingredient.  When we last checked her myeloma studies a month ago, her M spike was down to 1.2 g/dL.  I think this is a good indicator that things are working for her.  As always, her blood sugars are on the high side.  I really think that her diabetes will dictate her prognosis much more so than myeloma.  She is still somewhat on the anemic side.  Her erythropoietin level is 115.  I am not sure how effect ESA will be for her.  She has had no bleeding.  There is been no problems with abscess recurrence.  She has had no nausea or vomiting.  There is been no cough.  She has had no rashes.  There is been no leg swelling.  Overall, her performance status is ECOG 1.      Medications:  Allergies as of 12/21/2019      Reactions   Metformin Nausea Only   "severe GI"      Medication List       Accurate as of December 21, 2019  8:31 AM. If you have any questions, ask your nurse or doctor.        ALPRAZolam 0.5 MG tablet Commonly known as: XANAX Take 0.5 mg by mouth at bedtime as needed.   atorvastatin 10 MG tablet Commonly known as: LIPITOR Take 10 mg by mouth daily.   cyclobenzaprine 5 MG tablet Commonly known as: FLEXERIL Take 1 tablet (5 mg total)  by mouth 3 (three) times daily as needed.   FreeStyle [employer] Use one sensor every 10 days.   glipiZIDE 10 MG 24 hr tablet Commonly known as: GLUCOTROL XL Take 10 mg by mouth daily.   HYDROcodone-acetaminophen 5-325 MG tablet Commonly known as: NORCO/VICODIN Take 1 tablet by mouth every 6 (six) hours as needed for moderate pain. Received from [facility] ED.   latanoprost 0.005 % ophthalmic solution Commonly known as: XALATAN Place 1 drop into both eyes at bedtime.   lisinopril 10 MG tablet Commonly known as: ZESTRIL Take 10 mg by mouth daily.   NovoLOG 100 UNIT/ML injection Generic drug: insulin aspart Inject 1.2-25 Units into the skin as directed. PATIENT USES INSULIN PUMP  Patient has a set basal rate of 1.2-1.5u per hour with meal time boluses of up to 25 units   [place] 1 Device by Does not apply route 3 (three) times a week. Patient put on insulin pump every 2 to 3 days for insulin delivery   ondansetron 8 MG tablet Commonly known as: ZOFRAN Take 1 tablet (8 mg total) by mouth every 8 (eight) hours as needed for nausea or vomiting.   PARoxetine 30 MG tablet Commonly known as:  PAXIL Take 30 mg by mouth every morning.   potassium chloride SA 20 MEQ tablet Commonly known as: KLOR-CON Take 2 tablets (40 mEq total) by mouth daily.   PRESCRIPTION MEDICATION Onmipod dash-insulin pump.Changes every 2 days. 5-25units ac based on blood sugar.   prochlorperazine 10 MG tablet Commonly known as: COMPAZINE Take 1 tablet (10 mg total) by mouth every 6 (six) hours as needed (Nausea or vomiting).   temazepam 15 MG capsule Commonly known as: RESTORIL Take 1 capsule (15 mg total) by mouth at bedtime as needed for sleep.       Allergies:  Allergies  Allergen Reactions  . Metformin Nausea Only    "severe GI"    Past Medical History, Surgical history, Social history, and Family History were reviewed and updated.  Review of  Systems: Review of Systems  Constitutional: Negative.   HENT: Negative.   Eyes: Negative.   Respiratory: Negative.   Cardiovascular: Negative.   Gastrointestinal: Negative.   Genitourinary: Negative.   Musculoskeletal: Negative.   Skin: Negative.   Neurological: Negative.   Endo/Heme/Allergies: Negative.   Psychiatric/Behavioral: Negative.      Physical Exam:  weight is 186 lb 12.8 oz (84.7 kg). Her temporal temperature is 97.3 F (36.3 C) (abnormal). Her blood pressure is 169/64 (abnormal) and her pulse is 81. Her respiration is 19 and oxygen saturation is 100%.   Wt Readings from Last 3 Encounters:  12/21/19 186 lb 12.8 oz (84.7 kg)  11/23/19 190 lb 12.8 oz (86.5 kg)  11/03/19 189 lb (85.7 kg)    Physical Exam Vitals reviewed.  HENT:     Head: Normocephalic and atraumatic.  Eyes:     Pupils: Pupils are equal, round, and reactive to light.  Cardiovascular:     Rate and Rhythm: Normal rate and regular rhythm.     Heart sounds: Normal heart sounds.  Pulmonary:     Effort: Pulmonary effort is normal.     Breath sounds: Normal breath sounds.  Abdominal:     General: Bowel sounds are normal.     Palpations: Abdomen is soft.  Musculoskeletal:        General: No tenderness or deformity. Normal range of motion.     Cervical back: Normal range of motion.  Lymphadenopathy:     Cervical: No cervical adenopathy.  Skin:    General: Skin is warm and dry.     Findings: No erythema or rash.  Neurological:     Mental Status: She is alert and oriented to person, place, and time.  Psychiatric:        Behavior: Behavior normal.        Thought Content: Thought content normal.        Judgment: Judgment normal.      Lab Results  Component Value Date   WBC 3.4 (L) 12/21/2019   HGB 8.8 (L) 12/21/2019   HCT 28.2 (L) 12/21/2019   MCV 102.5 (H) 12/21/2019   PLT 221 12/21/2019   Lab Results  Component Value Date   FERRITIN 406 (H) 07/12/2019   IRON 125 07/12/2019   TIBC  283 07/12/2019   UIBC 157 07/12/2019   IRONPCTSAT 44 07/12/2019   Lab Results  Component Value Date   RBC 2.75 (L) 12/21/2019   Lab Results  Component Value Date   KPAFRELGTCHN 192.7 (H) 11/23/2019   LAMBDASER 2.8 (L) 11/23/2019   KAPLAMBRATIO 68.82 (H) 11/23/2019   Lab Results  Component Value Date   IGGSERUM 1,500 11/23/2019   IGGSERUM  1,576 11/23/2019   IGA 12 (L) 11/23/2019   IGA 13 (L) 11/23/2019   IGMSERUM 14 (L) 11/23/2019   IGMSERUM 16 (L) 11/23/2019   Lab Results  Component Value Date   TOTALPROTELP 6.8 11/23/2019   ALBUMINELP 3.5 11/23/2019   A1GS 0.3 11/23/2019   A2GS 0.9 11/23/2019   BETS 0.9 11/23/2019   GAMS 1.3 11/23/2019   MSPIKE 1.2 (H) 11/23/2019   SPEI Comment 10/11/2019     Chemistry      Component Value Date/Time   NA 142 12/21/2019 0745   K 3.5 12/21/2019 0745   CL 107 12/21/2019 0745   CO2 27 12/21/2019 0745   BUN 9 12/21/2019 0745   CREATININE 0.79 12/21/2019 0745   CREATININE 0.65 01/12/2018 1004      Component Value Date/Time   CALCIUM 8.5 (L) 12/21/2019 0745   ALKPHOS 112 12/21/2019 0745   AST 11 (L) 12/21/2019 0745   ALT 9 12/21/2019 0745   BILITOT 0.5 12/21/2019 0745       Impression and Plan: Ms. Vicki Moon is a very pleasant 68 yo caucasian female with IgG kappa myeloma.  I think that her cytogenetics place her in high risk group.   I would like to think that her myeloma level will now be less than 1 g/dL.  She is really done well with the new protocol.  Hopefully, we might be able just to have her on daratumumab.  We will plan to get her back to see [place] in another month.  I am sure that she will have a wonderful New Year's holiday with her family.      [name], MD 12/30/20208:31 AM

## 2019-12-21 NOTE — Patient Instructions (Signed)
Daratumumab injection What is this medicine? DARATUMUMAB (dar a toom ue mab) is a monoclonal antibody. It is used to treat multiple myeloma. This medicine may be used for other purposes; ask your health care provider or pharmacist if you have questions. COMMON BRAND NAME(S): DARZALEX What should I tell my health care provider before I take this medicine? They need to know if you have any of these conditions:  infection (especially a virus infection such as chickenpox, herpes, or hepatitis B virus)  lung or breathing disease  an unusual or allergic reaction to daratumumab, other medicines, foods, dyes, or preservatives  pregnant or trying to get pregnant  breast-feeding How should I use this medicine? This medicine is for infusion into a vein. It is given by a health care professional in a hospital or clinic setting. Talk to your pediatrician regarding the use of this medicine in children. Special care may be needed. Overdosage: If you think you have taken too much of this medicine contact a poison control center or emergency room at once. NOTE: This medicine is only for you. Do not share this medicine with others. What if I miss a dose? Keep appointments for follow-up doses as directed. It is important not to miss your dose. Call your doctor or health care professional if you are unable to keep an appointment. What may interact with this medicine? Interactions have not been studied. This list may not describe all possible interactions. Give your health care provider a list of all the medicines, herbs, non-prescription drugs, or dietary supplements you use. Also tell them if you smoke, drink alcohol, or use illegal drugs. Some items may interact with your medicine. What should I watch for while using this medicine? This drug may make you feel generally unwell. Report any side effects. Continue your course of treatment even though you feel ill unless your doctor tells you to stop. This  medicine can cause serious allergic reactions. To reduce your risk you may need to take medicine before treatment with this medicine. Take your medicine as directed. This medicine can affect the results of blood tests to match your blood type. These changes can last for up to 6 months after the final dose. Your healthcare provider will do blood tests to match your blood type before you start treatment. Tell all of your healthcare providers that you are being treated with this medicine before receiving a blood transfusion. This medicine can affect the results of some tests used to determine treatment response; extra tests may be needed to evaluate response. Do not become pregnant while taking this medicine or for 3 months after stopping it. Women should inform their doctor if they wish to become pregnant or think they might be pregnant. There is a potential for serious side effects to an unborn child. Talk to your health care professional or pharmacist for more information. What side effects may I notice from receiving this medicine? Side effects that you should report to your doctor or health care professional as soon as possible:  allergic reactions like skin rash, itching or hives, swelling of the face, lips, or tongue  breathing problems  chills  cough  dizziness  feeling faint or lightheaded  headache  low blood counts - this medicine may decrease the number of white blood cells, red blood cells and platelets. You may be at increased risk for infections and bleeding.  nausea, vomiting  shortness of breath  signs of decreased platelets or bleeding - bruising, pinpoint red spots on  the skin, black, tarry stools, blood in the urine  signs of decreased red blood cells - unusually weak or tired, feeling faint or lightheaded, falls  signs of infection - fever or chills, cough, sore throat, pain or difficulty passing urine  signs and symptoms of liver injury like dark yellow or brown  urine; general ill feeling or flu-like symptoms; light-colored stools; loss of appetite; right upper belly pain; unusually weak or tired; yellowing of the eyes or skin Side effects that usually do not require medical attention (report to your doctor or health care professional if they continue or are bothersome):  back pain  constipation  loss of appetite  diarrhea  joint pain  muscle cramps  pain, tingling, numbness in the hands or feet  swelling of the ankles, feet, hands  tiredness  trouble sleeping This list may not describe all possible side effects. Call your doctor for medical advice about side effects. You may report side effects to FDA at 1-800-FDA-1088. Where should I keep my medicine? Keep out of the reach of children. This drug is given in a hospital or clinic and will not be stored at home. NOTE: This sheet is a summary. It may not cover all possible information. If you have questions about this medicine, talk to your doctor, pharmacist, or health care provider.  2020 Elsevier/Gold Standard (2018-09-23 14:00:48) Carfilzomib injection What is this medicine? CARFILZOMIB (kar FILZ oh mib) targets a specific protein within cancer cells and stops the cancer cells from growing. It is used to treat multiple myeloma. This medicine may be used for other purposes; ask your health care provider or pharmacist if you have questions. COMMON BRAND NAME(S): KYPROLIS What should I tell my health care provider before I take this medicine? They need to know if you have any of these conditions:  heart disease  history of blood clots  irregular heartbeat  kidney disease  liver disease  lung or breathing disease  an unusual or allergic reaction to carfilzomib, or other medicines, foods, dyes, or preservatives  pregnant or trying to get pregnant  breast-feeding How should I use this medicine? This medicine is for injection or infusion into a vein. It is given by a health  care professional in a hospital or clinic setting. Talk to your pediatrician regarding the use of this medicine in children. Special care may be needed. Overdosage: If you think you have taken too much of this medicine contact a poison control center or emergency room at once. NOTE: This medicine is only for you. Do not share this medicine with others. What if I miss a dose? It is important not to miss your dose. Call your doctor or health care professional if you are unable to keep an appointment. What may interact with this medicine? Interactions are not expected. Give your health care provider a list of all the medicines, herbs, non-prescription drugs, or dietary supplements you use. Also tell them if you smoke, drink alcohol, or use illegal drugs. Some items may interact with your medicine. This list may not describe all possible interactions. Give your health care provider a list of all the medicines, herbs, non-prescription drugs, or dietary supplements you use. Also tell them if you smoke, drink alcohol, or use illegal drugs. Some items may interact with your medicine. What should I watch for while using this medicine? Your condition will be monitored carefully while you are receiving this medicine. Report any side effects. Continue your course of treatment even though you feel ill  unless your doctor tells you to stop. You may need blood work done while you are taking this medicine. Do not become pregnant while taking this medicine or for at least 6 months after stopping it. Women should inform their doctor if they wish to become pregnant or think they might be pregnant. There is a potential for serious side effects to an unborn child. Men should not father a child while taking this medicine and for at least 3 months after stopping it. Talk to your health care professional or pharmacist for more information. Do not breast-feed an infant while taking this medicine or for 2 weeks after the last  dose. Check with your doctor or health care professional if you get an attack of severe diarrhea, nausea and vomiting, or if you sweat a lot. The loss of too much body fluid can make it dangerous for you to take this medicine. You may get dizzy. Do not drive, use machinery, or do anything that needs mental alertness until you know how this medicine affects you. Do not stand or sit up quickly, especially if you are an older patient. This reduces the risk of dizzy or fainting spells. What side effects may I notice from receiving this medicine? Side effects that you should report to your doctor or health care professional as soon as possible:  allergic reactions like skin rash, itching or hives, swelling of the face, lips, or tongue  confusion  dizziness  feeling faint or lightheaded  fever or chills  palpitations  seizures  signs and symptoms of bleeding such as bloody or black, tarry stools; red or dark-brown urine; spitting up blood or brown material that looks like coffee grounds; red spots on the skin; unusual bruising or bleeding including from the eye, gums, or nose  signs and symptoms of a blood clot such as breathing problems; changes in vision; chest pain; severe, sudden headache; pain, swelling, warmth in the leg; trouble speaking; sudden numbness or weakness of the face, arm or leg  signs and symptoms of kidney injury like trouble passing urine or change in the amount of urine  signs and symptoms of liver injury like dark yellow or brown urine; general ill feeling or flu-like symptoms; light-colored stools; loss of appetite; nausea; right upper belly pain; unusually weak or tired; yellowing of the eyes or skin Side effects that usually do not require medical attention (report to your doctor or health care professional if they continue or are bothersome):  back pain  cough  diarrhea  headache  muscle cramps  vomiting This list may not describe all possible side  effects. Call your doctor for medical advice about side effects. You may report side effects to FDA at 1-800-FDA-1088. Where should I keep my medicine? This drug is given in a hospital or clinic and will not be stored at home. NOTE: This sheet is a summary. It may not cover all possible information. If you have questions about this medicine, talk to your doctor, pharmacist, or health care provider.  2020 Elsevier/Gold Standard (2017-09-23 14:07:13)

## 2019-12-22 LAB — IGG, IGA, IGM
IgA: 8 mg/dL — ABNORMAL LOW (ref 87–352)
IgG (Immunoglobin G), Serum: 1262 mg/dL (ref 586–1602)
IgM (Immunoglobulin M), Srm: 11 mg/dL — ABNORMAL LOW (ref 26–217)

## 2019-12-22 LAB — KAPPA/LAMBDA LIGHT CHAINS
Kappa free light chain: 115.2 mg/L — ABNORMAL HIGH (ref 3.3–19.4)
Kappa, lambda light chain ratio: 52.36 — ABNORMAL HIGH (ref 0.26–1.65)
Lambda free light chains: 2.2 mg/L — ABNORMAL LOW (ref 5.7–26.3)

## 2019-12-26 DIAGNOSIS — E782 Mixed hyperlipidemia: Secondary | ICD-10-CM | POA: Diagnosis not present

## 2019-12-26 DIAGNOSIS — I10 Essential (primary) hypertension: Secondary | ICD-10-CM | POA: Diagnosis not present

## 2019-12-26 DIAGNOSIS — E1165 Type 2 diabetes mellitus with hyperglycemia: Secondary | ICD-10-CM | POA: Diagnosis not present

## 2019-12-26 DIAGNOSIS — E119 Type 2 diabetes mellitus without complications: Secondary | ICD-10-CM | POA: Diagnosis not present

## 2019-12-26 DIAGNOSIS — E1169 Type 2 diabetes mellitus with other specified complication: Secondary | ICD-10-CM | POA: Diagnosis not present

## 2019-12-27 LAB — PROTEIN ELECTROPHORESIS, SERUM, WITH REFLEX
A/G Ratio: 1.1 (ref 0.7–1.7)
Albumin ELP: 3.3 g/dL (ref 2.9–4.4)
Alpha-1-Globulin: 0.2 g/dL (ref 0.0–0.4)
Alpha-2-Globulin: 0.8 g/dL (ref 0.4–1.0)
Beta Globulin: 0.8 g/dL (ref 0.7–1.3)
Gamma Globulin: 1 g/dL (ref 0.4–1.8)
Globulin, Total: 2.9 g/dL (ref 2.2–3.9)
M-Spike, %: 0.9 g/dL — ABNORMAL HIGH
SPEP Interpretation: 0
Total Protein ELP: 6.2 g/dL (ref 6.0–8.5)

## 2019-12-27 LAB — IMMUNOFIXATION REFLEX, SERUM
IgA: 9 mg/dL — ABNORMAL LOW (ref 87–352)
IgG (Immunoglobin G), Serum: 1405 mg/dL (ref 586–1602)
IgM (Immunoglobulin M), Srm: 11 mg/dL — ABNORMAL LOW (ref 26–217)

## 2019-12-28 ENCOUNTER — Inpatient Hospital Stay: Payer: Medicare Other

## 2019-12-28 ENCOUNTER — Telehealth: Payer: Self-pay | Admitting: *Deleted

## 2019-12-28 ENCOUNTER — Other Ambulatory Visit: Payer: Self-pay

## 2019-12-28 ENCOUNTER — Inpatient Hospital Stay: Payer: Medicare Other | Attending: Hematology & Oncology

## 2019-12-28 VITALS — BP 171/50 | HR 63 | Temp 97.4°F | Resp 17

## 2019-12-28 DIAGNOSIS — R5383 Other fatigue: Secondary | ICD-10-CM | POA: Insufficient documentation

## 2019-12-28 DIAGNOSIS — C9000 Multiple myeloma not having achieved remission: Secondary | ICD-10-CM

## 2019-12-28 DIAGNOSIS — Z5111 Encounter for antineoplastic chemotherapy: Secondary | ICD-10-CM | POA: Diagnosis not present

## 2019-12-28 DIAGNOSIS — Z79899 Other long term (current) drug therapy: Secondary | ICD-10-CM | POA: Diagnosis not present

## 2019-12-28 DIAGNOSIS — D500 Iron deficiency anemia secondary to blood loss (chronic): Secondary | ICD-10-CM

## 2019-12-28 DIAGNOSIS — R0609 Other forms of dyspnea: Secondary | ICD-10-CM | POA: Insufficient documentation

## 2019-12-28 LAB — CBC WITH DIFFERENTIAL (CANCER CENTER ONLY)
Abs Immature Granulocytes: 0.05 10*3/uL (ref 0.00–0.07)
Basophils Absolute: 0 10*3/uL (ref 0.0–0.1)
Basophils Relative: 0 %
Eosinophils Absolute: 0.1 10*3/uL (ref 0.0–0.5)
Eosinophils Relative: 1 %
HCT: 27.4 % — ABNORMAL LOW (ref 36.0–46.0)
Hemoglobin: 8.9 g/dL — ABNORMAL LOW (ref 12.0–15.0)
Immature Granulocytes: 1 %
Lymphocytes Relative: 7 %
Lymphs Abs: 0.4 10*3/uL — ABNORMAL LOW (ref 0.7–4.0)
MCH: 31.9 pg (ref 26.0–34.0)
MCHC: 32.5 g/dL (ref 30.0–36.0)
MCV: 98.2 fL (ref 80.0–100.0)
Monocytes Absolute: 0.8 10*3/uL (ref 0.1–1.0)
Monocytes Relative: 14 %
Neutro Abs: 4.4 10*3/uL (ref 1.7–7.7)
Neutrophils Relative %: 77 %
Platelet Count: 125 10*3/uL — ABNORMAL LOW (ref 150–400)
RBC: 2.79 MIL/uL — ABNORMAL LOW (ref 3.87–5.11)
RDW: 15.4 % (ref 11.5–15.5)
WBC Count: 5.7 10*3/uL (ref 4.0–10.5)
nRBC: 0.3 % — ABNORMAL HIGH (ref 0.0–0.2)

## 2019-12-28 LAB — CMP (CANCER CENTER ONLY)
ALT: 8 U/L (ref 0–44)
AST: 9 U/L — ABNORMAL LOW (ref 15–41)
Albumin: 3.8 g/dL (ref 3.5–5.0)
Alkaline Phosphatase: 104 U/L (ref 38–126)
Anion gap: 9 (ref 5–15)
BUN: 10 mg/dL (ref 8–23)
CO2: 24 mmol/L (ref 22–32)
Calcium: 8.8 mg/dL — ABNORMAL LOW (ref 8.9–10.3)
Chloride: 106 mmol/L (ref 98–111)
Creatinine: 0.75 mg/dL (ref 0.44–1.00)
GFR, Est AFR Am: >60 mL/min (ref >60)
GFR, Estimated: >60 mL/min (ref >60)
Glucose, Bld: 192 mg/dL — ABNORMAL HIGH (ref 70–99)
Potassium: 3 mmol/L — CL (ref 3.5–5.1)
Sodium: 139 mmol/L (ref 135–145)
Total Bilirubin: 0.7 mg/dL (ref 0.3–1.2)
Total Protein: 6.4 g/dL — ABNORMAL LOW (ref 6.5–8.1)

## 2019-12-28 LAB — IRON AND TIBC
Iron: 57 ug/dL (ref 41–142)
Saturation Ratios: 19 % — ABNORMAL LOW (ref 21–57)
TIBC: 296 ug/dL (ref 236–444)
UIBC: 239 ug/dL (ref 120–384)

## 2019-12-28 LAB — FERRITIN: Ferritin: 754 ng/mL — ABNORMAL HIGH (ref 11–307)

## 2019-12-28 MED ORDER — HEPARIN SOD (PORK) LOCK FLUSH 100 UNIT/ML IV SOLN
500.0000 [IU] | Freq: Once | INTRAVENOUS | Status: DC | PRN
  Filled 2019-12-28: qty 5

## 2019-12-28 MED ORDER — DEXAMETHASONE 4 MG PO TABS: 20.0000 mg | ORAL_TABLET | Freq: Once | ORAL | Status: DC

## 2019-12-28 MED ORDER — DEXAMETHASONE SODIUM PHOSPHATE 10 MG/ML IJ SOLN
INTRAMUSCULAR | Status: AC
  Filled 2019-12-28: qty 1

## 2019-12-28 MED ORDER — DEXTROSE 5 % IV SOLN
72.0000 mg/m2 | Freq: Once | INTRAVENOUS | Status: AC
  Administered 2019-12-28: 140 mg via INTRAVENOUS
  Filled 2019-12-28: qty 60

## 2019-12-28 MED ORDER — DEXAMETHASONE SODIUM PHOSPHATE 10 MG/ML IJ SOLN
10.0000 mg | Freq: Once | INTRAMUSCULAR | Status: AC
  Administered 2019-12-28: 10 mg via INTRAVENOUS

## 2019-12-28 MED ORDER — SODIUM CHLORIDE 0.9% FLUSH
10.0000 mL | INTRAVENOUS | Status: DC | PRN
  Filled 2019-12-28: qty 10

## 2019-12-28 MED ORDER — SODIUM CHLORIDE 0.9 % IV SOLN
Freq: Once | INTRAVENOUS | Status: AC
  Filled 2019-12-28: qty 250

## 2019-12-28 NOTE — Patient Instructions (Signed)
[facility] Discharge Instructions for Patients Receiving Chemotherapy  Today you received the following chemotherapy agents Kyprolis  To help prevent nausea and vomiting after your treatment, we encourage you to take your nausea medication as prescribed by MD.   If you develop nausea and vomiting that is not controlled by your nausea medication, call the clinic.   BELOW ARE SYMPTOMS THAT SHOULD BE REPORTED IMMEDIATELY:  *FEVER GREATER THAN 100.5 F  *CHILLS WITH OR WITHOUT FEVER  NAUSEA AND VOMITING THAT IS NOT CONTROLLED WITH YOUR NAUSEA MEDICATION  *UNUSUAL SHORTNESS OF BREATH  *UNUSUAL BRUISING OR BLEEDING  TENDERNESS IN MOUTH AND THROAT WITH OR WITHOUT PRESENCE OF ULCERS  *URINARY PROBLEMS  *BOWEL PROBLEMS  UNUSUAL RASH Items with * indicate a potential emergency and should be followed up as soon as possible.  Feel free to call the clinic should you have any questions or concerns. The clinic phone number is [phone].  Please show the CHEMO ALERT CARD at check-in to the Emergency Department and triage nurse.   

## 2019-12-28 NOTE — Telephone Encounter (Signed)
Received Critical K of 3.0 from [facility] in lab.  Dr. [name] notified. No orders received.

## 2019-12-28 NOTE — Telephone Encounter (Addendum)
-----   Message from [name], MD sent at 12/27/2019  1:01 PM EST ----- Called to let patient know the myeloma is now less than 1!!!!  Fantastic!!  Vicki Moon

## 2019-12-29 ENCOUNTER — Encounter: Payer: Self-pay | Admitting: Orthopaedic Surgery

## 2019-12-29 ENCOUNTER — Ambulatory Visit (INDEPENDENT_AMBULATORY_CARE_PROVIDER_SITE_OTHER): Payer: Medicare Other | Admitting: Orthopaedic Surgery

## 2019-12-29 ENCOUNTER — Ambulatory Visit (HOSPITAL_COMMUNITY)
Admission: RE | Admit: 2019-12-29 | Discharge: 2019-12-29 | Disposition: A | Discharge status: Home / Self Care | Payer: Medicare Other | Source: Ambulatory Visit | Attending: Orthopaedic Surgery | Admitting: Orthopaedic Surgery

## 2019-12-29 VITALS — BP 166/78 | HR 86 | Ht 62.0 in | Wt 182.0 lb

## 2019-12-29 DIAGNOSIS — M7989 Other specified soft tissue disorders: Secondary | ICD-10-CM | POA: Insufficient documentation

## 2019-12-29 DIAGNOSIS — M79605 Pain in left leg: Secondary | ICD-10-CM

## 2019-12-29 NOTE — Progress Notes (Signed)
Office Visit Note   Patient: Vicki Moon           Date of Birth: 04-13-1951           MRN: [id number] Visit Date: 12/29/2019              Requested by: [name], MD [place],  [place] [id number] PCP: [name], MD   Assessment & Plan: Visit Diagnoses:  1. Pain and swelling of left lower extremity     Plan: I reviewed the patient previous MRI scan lumbar in April and also CT scan abdomen pelvis in May.  Patient had some lateral recess herniation mass-effect L5-S1 but this was on the right and her Symptoms are on the left.  With her myeloma and recommended Doppler test make sure she does not have DVT.  She will continue to ambulate with a cane for fall prevention as well as her decreased visual acuity problems.  I will call her with the results of the Doppler test.  Follow-Up Instructions: No follow-ups on file.   Orders:  Orders Placed This Encounter  Procedures  . US Venous Img Lower Unilateral Left (DVT)   No orders of the defined types were placed in this encounter.     Procedures: No procedures performed   Clinical Data: No additional findings.   Subjective: Chief Complaint  Patient presents with  . Left Leg - Pain    HPI 69 year old female returns with pain in her calf that been present for a few weeks.  I saw her last April and MRI scan was obtained which showed pathologic process in the lumbar vertebrae which ultimately diagnosed with multiple myeloma.  First to chemo rounds were not effective but the latest 1 is been much better she states.  She has had development of left proximal calf pain in the midportion of the gastroc and pain with walking.  Previous diagnostic test showed some involvement at L5S1 with her myelomatous process as well as L2.  Review of Systems updated unchanged other than as mentioned in HPI.   Objective: Vital Signs: BP (!) 166/78   Pulse 86   Ht _0  (1.575 m)   Wt 182 lb (82.6 kg)   BMI 33.29 kg/m     Physical Exam patient has decreased visual acuity.  No dyspnea no wheezing.  Cardiac: Pulse is regular.    Ortho Exam bilateral trochanteric bursal tenderness.  Negative logroll to the hips.  Negative straight leg raising 90 degrees minimal sciatic notch tenderness.  She has tenderness midportion of her left calf between the heads of the gastrocs.  Gastrocsoleus strength is good.  Positive Homans' sign.  Distal pulses are 2+.  Specialty Comments:  No specialty comments available.  Imaging: No results found.   PMFS History: Patient Active Problem List   Diagnosis Date Noted  . Hypokalemia   . Furunculosis 05/25/2019  . 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 Diabetes mellitus without complication ([place]) 00/93/8182  . Multiple Skin abscessed due to MRSA 05/24/2019  . Pancytopenia ([place]) 05/24/2019  . Multiple myeloma ([place]) 04/29/2019  . Goals of care, counseling/discussion 04/29/2019  . Pain in abdominal muscle of right flank 03/08/2019  . Abscess of right axilla   . Cellulitis of axilla, right   . Cellulitis 12/17/2018  . AKI (acute kidney injury) ([place]) 12/17/2018  . Anemia 12/17/2018  . Type 2 diabetes mellitus ([place]) 12/17/2018  . Depression 12/17/2018  . Trochanteric bursitis, right hip  08/12/2018  . Post-menopausal 01/19/2018  . Current smoker 01/19/2018  . Mixed hyperlipidemia 07/13/2017  . Class 2 severe obesity due to excess calories with serious comorbidity and body mass index (BMI) of 37.0 to 37.9 in adult ([place]) 07/13/2017  . Hypercortisolemia 01/20/2017  . Excessive weight gain 01/20/2017  . Generalized abdominal pain 01/20/2017  . Uncontrolled type 2 diabetes mellitus with complication, with long-term current use of insulin ([place]) 12/20/2015  . Essential hypertension, benign 12/20/2015  . Vitamin D deficiency 12/20/2015  . Rectal bleeding 05/29/2015  . HNP (herniated nucleus pulposus), lumbar 07/12/2014    Class: Diagnosis of   Past Medical History:   Diagnosis Date  . Diabetes mellitus without complication (HCC)    Type 2 IDDM x 5 years  . Glaucoma   . Goals of care, counseling/discussion 04/29/2019  . Herniated lumbar intervertebral disc 06/2014  . Multiple myeloma ([place]) 04/29/2019  . PONV (postoperative nausea and vomiting)     Family History  Problem Relation Age of Onset  . Hypertension Mother   . Stroke Mother   . Cancer Father        brain tumor  . Diabetes Father     Past Surgical History:  Procedure Laterality Date  . CHOLECYSTECTOMY    . COLONOSCOPY N/A 06/28/2015   Procedure: COLONOSCOPY;  Surgeon: Rogene Houston, MD;  [facility];  Service: Endoscopy;  Laterality: N/A;  155  . EYE SURGERY    . IR BONE TUMOR(S)RF ABLATION  04/28/2019  . IR KYPHO LUMBAR INC FX REDUCE BONE BX UNI/BIL CANNULATION INC/IMAGING  04/28/2019  . IR RADIOLOGIST EVAL & MGMT  04/22/2019  . LUMBAR LAMINECTOMY Right 07/12/2014   Procedure: LUMBAR FIVE TO SACRAL ONE MICRODISCECTOMY;  Surgeon: Marybelle Killings, MD;  Location: [facility];  Service: Orthopedics;  Laterality: Right;  . TUBAL LIGATION    . VITRECTOMY 25 GAUGE WITH SCLERAL BUCKLE Left 07/29/2017   Procedure: RETINAL DETACHMENT REPAIR LEFT EYE WITH PARSPLANA VITRECTOMY, AIR FLUID EXCHANGE, ENDO LASTER, DRAINAGE OF SUBRETINAL FLUID,ENDOLASER PPV /25 GAUGE;  Surgeon: Jalene Mullet, MD;  Location: [facility];  Service: Ophthalmology;  Laterality: Left;   Social History   Occupational History  . Not on file  Tobacco Use  . Smoking status: Former Smoker    Packs/day: 1.00    Years: 10.00    Pack years: 10.00    Types: Cigarettes    Quit date: 02/20/2019    Years since quitting: 0.8  . Smokeless tobacco: Never Used  . Tobacco comment:  1/2 pack a day since age 84  Substance and Sexual Activity  . Alcohol use: No    Alcohol/week: 0.0 standard drinks  . Drug use: No  . Sexual activity: Yes

## 2020-01-04 ENCOUNTER — Other Ambulatory Visit: Payer: Self-pay | Admitting: *Deleted

## 2020-01-04 ENCOUNTER — Telehealth: Payer: Self-pay | Admitting: *Deleted

## 2020-01-04 ENCOUNTER — Inpatient Hospital Stay: Payer: Medicare Other

## 2020-01-04 ENCOUNTER — Telehealth: Payer: Self-pay | Admitting: Hematology & Oncology

## 2020-01-04 NOTE — Telephone Encounter (Signed)
Called and spoke with patient regarding appointments being rescheduled per 1/13 sch msg

## 2020-01-04 NOTE — Telephone Encounter (Signed)
Message received from patient stating that she forgot about appt today and would like to reschedule.  Message sent to scheduling.

## 2020-01-05 ENCOUNTER — Inpatient Hospital Stay: Payer: Medicare Other

## 2020-01-05 ENCOUNTER — Other Ambulatory Visit: Payer: Self-pay | Admitting: *Deleted

## 2020-01-05 ENCOUNTER — Other Ambulatory Visit: Payer: Self-pay

## 2020-01-05 VITALS — BP 165/62 | HR 76 | Temp 97.4°F | Resp 17

## 2020-01-05 DIAGNOSIS — D500 Iron deficiency anemia secondary to blood loss (chronic): Secondary | ICD-10-CM

## 2020-01-05 DIAGNOSIS — C9000 Multiple myeloma not having achieved remission: Secondary | ICD-10-CM | POA: Diagnosis not present

## 2020-01-05 DIAGNOSIS — Z79899 Other long term (current) drug therapy: Secondary | ICD-10-CM | POA: Diagnosis not present

## 2020-01-05 DIAGNOSIS — R0609 Other forms of dyspnea: Secondary | ICD-10-CM | POA: Diagnosis not present

## 2020-01-05 DIAGNOSIS — Z5111 Encounter for antineoplastic chemotherapy: Secondary | ICD-10-CM | POA: Diagnosis not present

## 2020-01-05 DIAGNOSIS — R5383 Other fatigue: Secondary | ICD-10-CM | POA: Diagnosis not present

## 2020-01-05 LAB — CBC WITH DIFFERENTIAL (CANCER CENTER ONLY)
Abs Immature Granulocytes: 0.05 10*3/uL (ref 0.00–0.07)
Basophils Absolute: 0 10*3/uL (ref 0.0–0.1)
Basophils Relative: 1 %
Eosinophils Absolute: 0.1 10*3/uL (ref 0.0–0.5)
Eosinophils Relative: 2 %
HCT: 26.6 % — ABNORMAL LOW (ref 36.0–46.0)
Hemoglobin: 8.7 g/dL — ABNORMAL LOW (ref 12.0–15.0)
Immature Granulocytes: 1 %
Lymphocytes Relative: 10 %
Lymphs Abs: 0.4 10*3/uL — ABNORMAL LOW (ref 0.7–4.0)
MCH: 32.8 pg (ref 26.0–34.0)
MCHC: 32.7 g/dL (ref 30.0–36.0)
MCV: 100.4 fL — ABNORMAL HIGH (ref 80.0–100.0)
Monocytes Absolute: 0.7 10*3/uL (ref 0.1–1.0)
Monocytes Relative: 18 %
Neutro Abs: 2.7 10*3/uL (ref 1.7–7.7)
Neutrophils Relative %: 68 %
Platelet Count: 141 10*3/uL — ABNORMAL LOW (ref 150–400)
RBC: 2.65 MIL/uL — ABNORMAL LOW (ref 3.87–5.11)
RDW: 16 % — ABNORMAL HIGH (ref 11.5–15.5)
WBC Count: 4 10*3/uL (ref 4.0–10.5)
nRBC: 1 % — ABNORMAL HIGH (ref 0.0–0.2)

## 2020-01-05 LAB — CMP (CANCER CENTER ONLY)
ALT: 9 U/L (ref 0–44)
AST: 12 U/L — ABNORMAL LOW (ref 15–41)
Albumin: 3.7 g/dL (ref 3.5–5.0)
Alkaline Phosphatase: 99 U/L (ref 38–126)
Anion gap: 8 (ref 5–15)
BUN: 8 mg/dL (ref 8–23)
CO2: 29 mmol/L (ref 22–32)
Calcium: 8.6 mg/dL — ABNORMAL LOW (ref 8.9–10.3)
Chloride: 105 mmol/L (ref 98–111)
Creatinine: 0.8 mg/dL (ref 0.44–1.00)
GFR, Est AFR Am: >60 mL/min (ref >60)
GFR, Estimated: >60 mL/min (ref >60)
Glucose, Bld: 179 mg/dL — ABNORMAL HIGH (ref 70–99)
Potassium: 3.3 mmol/L — ABNORMAL LOW (ref 3.5–5.1)
Sodium: 142 mmol/L (ref 135–145)
Total Bilirubin: 0.7 mg/dL (ref 0.3–1.2)
Total Protein: 6.3 g/dL — ABNORMAL LOW (ref 6.5–8.1)

## 2020-01-05 MED ORDER — DEXAMETHASONE 4 MG PO TABS
20.0000 mg | ORAL_TABLET | Freq: Once | ORAL | Status: AC
  Administered 2020-01-05: 20 mg via ORAL

## 2020-01-05 MED ORDER — TEMAZEPAM 15 MG PO CAPS: 15.0000 mg | ORAL_CAPSULE | Freq: Every evening | ORAL | 0 refills | Status: DC | PRN

## 2020-01-05 MED ORDER — DARATUMUMAB-HYALURONIDASE-FIHJ 1800-30000 MG-UT/15ML ~~LOC~~ SOLN
1800.0000 mg | Freq: Once | SUBCUTANEOUS | Status: AC
  Administered 2020-01-05: 12:00:00 1800 mg via SUBCUTANEOUS
  Filled 2020-01-05: qty 15

## 2020-01-05 MED ORDER — DEXAMETHASONE 4 MG PO TABS
ORAL_TABLET | ORAL | Status: AC
  Filled 2020-01-05: qty 5

## 2020-01-05 MED ORDER — DEXTROSE 5 % IV SOLN
72.0000 mg/m2 | Freq: Once | INTRAVENOUS | Status: AC
  Administered 2020-01-05: 140 mg via INTRAVENOUS
  Filled 2020-01-05: qty 60

## 2020-01-05 MED ORDER — DIPHENHYDRAMINE HCL 25 MG PO CAPS
50.0000 mg | ORAL_CAPSULE | Freq: Once | ORAL | Status: AC
  Administered 2020-01-05: 10:00:00 50 mg via ORAL

## 2020-01-05 MED ORDER — ACETAMINOPHEN 325 MG PO TABS
ORAL_TABLET | ORAL | Status: AC
  Filled 2020-01-05: qty 2

## 2020-01-05 MED ORDER — SODIUM CHLORIDE 0.9 % IV SOLN
Freq: Once | INTRAVENOUS | Status: AC
  Filled 2020-01-05: qty 250

## 2020-01-05 MED ORDER — ACETAMINOPHEN 325 MG PO TABS
650.0000 mg | ORAL_TABLET | Freq: Once | ORAL | Status: AC
  Administered 2020-01-05: 650 mg via ORAL

## 2020-01-05 MED ORDER — DIPHENHYDRAMINE HCL 25 MG PO CAPS
ORAL_CAPSULE | ORAL | Status: AC
  Filled 2020-01-05: qty 2

## 2020-01-05 NOTE — Patient Instructions (Signed)
[facility] Discharge Instructions for Patients Receiving Chemotherapy  Today you received the following chemotherapy agents Faspro, Kyprolis  To help prevent nausea and vomiting after your treatment, we encourage you to take your nausea medication    If you develop nausea and vomiting that is not controlled by your nausea medication, call the clinic.   BELOW ARE SYMPTOMS THAT SHOULD BE REPORTED IMMEDIATELY:  *FEVER GREATER THAN 100.5 F  *CHILLS WITH OR WITHOUT FEVER  NAUSEA AND VOMITING THAT IS NOT CONTROLLED WITH YOUR NAUSEA MEDICATION  *UNUSUAL SHORTNESS OF BREATH  *UNUSUAL BRUISING OR BLEEDING  TENDERNESS IN MOUTH AND THROAT WITH OR WITHOUT PRESENCE OF ULCERS  *URINARY PROBLEMS  *BOWEL PROBLEMS  UNUSUAL RASH Items with * indicate a potential emergency and should be followed up as soon as possible.  Feel free to call the clinic should you have any questions or concerns. The clinic phone number is [phone].  Please show the [place] at check-in to the Emergency Department and triage nurse.

## 2020-01-05 NOTE — Progress Notes (Signed)
Reviewed all labwork with Dr. [name] as well as VS.  Ok to treat today.

## 2020-01-18 ENCOUNTER — Other Ambulatory Visit: Payer: Self-pay

## 2020-01-18 ENCOUNTER — Inpatient Hospital Stay: Payer: Medicare Other

## 2020-01-18 ENCOUNTER — Inpatient Hospital Stay (HOSPITAL_BASED_OUTPATIENT_CLINIC_OR_DEPARTMENT_OTHER): Payer: Medicare Other | Admitting: Family

## 2020-01-18 ENCOUNTER — Encounter: Payer: Self-pay | Admitting: Family

## 2020-01-18 VITALS — BP 164/55 | HR 67 | Temp 97.3°F | Resp 18 | Ht 62.0 in | Wt 182.8 lb

## 2020-01-18 DIAGNOSIS — D500 Iron deficiency anemia secondary to blood loss (chronic): Secondary | ICD-10-CM

## 2020-01-18 DIAGNOSIS — R0609 Other forms of dyspnea: Secondary | ICD-10-CM | POA: Diagnosis not present

## 2020-01-18 DIAGNOSIS — D631 Anemia in chronic kidney disease: Secondary | ICD-10-CM | POA: Diagnosis not present

## 2020-01-18 DIAGNOSIS — C9000 Multiple myeloma not having achieved remission: Secondary | ICD-10-CM | POA: Diagnosis not present

## 2020-01-18 DIAGNOSIS — C9002 Multiple myeloma in relapse: Secondary | ICD-10-CM

## 2020-01-18 DIAGNOSIS — Z79899 Other long term (current) drug therapy: Secondary | ICD-10-CM | POA: Diagnosis not present

## 2020-01-18 DIAGNOSIS — R5383 Other fatigue: Secondary | ICD-10-CM | POA: Diagnosis not present

## 2020-01-18 DIAGNOSIS — Z5111 Encounter for antineoplastic chemotherapy: Secondary | ICD-10-CM | POA: Diagnosis not present

## 2020-01-18 LAB — CBC WITH DIFFERENTIAL (CANCER CENTER ONLY)
Abs Immature Granulocytes: 0.05 10*3/uL (ref 0.00–0.07)
Basophils Absolute: 0 10*3/uL (ref 0.0–0.1)
Basophils Relative: 1 %
Eosinophils Absolute: 0.1 10*3/uL (ref 0.0–0.5)
Eosinophils Relative: 3 %
HCT: 28.6 % — ABNORMAL LOW (ref 36.0–46.0)
Hemoglobin: 8.9 g/dL — ABNORMAL LOW (ref 12.0–15.0)
Immature Granulocytes: 1 %
Lymphocytes Relative: 12 %
Lymphs Abs: 0.5 10*3/uL — ABNORMAL LOW (ref 0.7–4.0)
MCH: 31.4 pg (ref 26.0–34.0)
MCHC: 31.1 g/dL (ref 30.0–36.0)
MCV: 101.1 fL — ABNORMAL HIGH (ref 80.0–100.0)
Monocytes Absolute: 0.4 10*3/uL (ref 0.1–1.0)
Monocytes Relative: 9 %
Neutro Abs: 3.2 10*3/uL (ref 1.7–7.7)
Neutrophils Relative %: 74 %
Platelet Count: 197 10*3/uL (ref 150–400)
RBC: 2.83 MIL/uL — ABNORMAL LOW (ref 3.87–5.11)
RDW: 17 % — ABNORMAL HIGH (ref 11.5–15.5)
WBC Count: 4.3 10*3/uL (ref 4.0–10.5)
nRBC: 0.7 % — ABNORMAL HIGH (ref 0.0–0.2)

## 2020-01-18 LAB — CMP (CANCER CENTER ONLY)
ALT: 8 U/L (ref 0–44)
AST: 13 U/L — ABNORMAL LOW (ref 15–41)
Albumin: 3.8 g/dL (ref 3.5–5.0)
Alkaline Phosphatase: 94 U/L (ref 38–126)
Anion gap: 8 (ref 5–15)
BUN: 9 mg/dL (ref 8–23)
CO2: 25 mmol/L (ref 22–32)
Calcium: 8.7 mg/dL — ABNORMAL LOW (ref 8.9–10.3)
Chloride: 108 mmol/L (ref 98–111)
Creatinine: 0.79 mg/dL (ref 0.44–1.00)
GFR, Est AFR Am: >60 mL/min (ref >60)
GFR, Estimated: >60 mL/min (ref >60)
Glucose, Bld: 251 mg/dL — ABNORMAL HIGH (ref 70–99)
Potassium: 3.6 mmol/L (ref 3.5–5.1)
Sodium: 141 mmol/L (ref 135–145)
Total Bilirubin: 0.5 mg/dL (ref 0.3–1.2)
Total Protein: 7 g/dL (ref 6.5–8.1)

## 2020-01-18 LAB — LACTATE DEHYDROGENASE: LDH: 327 U/L — ABNORMAL HIGH (ref 98–192)

## 2020-01-18 MED ORDER — DEXTROSE 5 % IV SOLN
72.0000 mg/m2 | Freq: Once | INTRAVENOUS | Status: AC
  Administered 2020-01-18: 140 mg via INTRAVENOUS
  Filled 2020-01-18: qty 60

## 2020-01-18 MED ORDER — SODIUM CHLORIDE 0.9 % IV SOLN
510.0000 mg | Freq: Once | INTRAVENOUS | Status: AC
  Administered 2020-01-18: 510 mg via INTRAVENOUS
  Filled 2020-01-18: qty 510

## 2020-01-18 MED ORDER — DEXAMETHASONE 4 MG PO TABS
20.0000 mg | ORAL_TABLET | Freq: Once | ORAL | Status: AC
  Administered 2020-01-18: 20 mg via ORAL

## 2020-01-18 MED ORDER — SODIUM CHLORIDE 0.9 % IV SOLN
Freq: Once | INTRAVENOUS | Status: AC
  Filled 2020-01-18: qty 250

## 2020-01-18 MED ORDER — ACETAMINOPHEN 325 MG PO TABS
650.0000 mg | ORAL_TABLET | Freq: Once | ORAL | Status: AC
  Administered 2020-01-18: 650 mg via ORAL

## 2020-01-18 MED ORDER — DIPHENHYDRAMINE HCL 25 MG PO CAPS
50.0000 mg | ORAL_CAPSULE | Freq: Once | ORAL | Status: AC
  Administered 2020-01-18: 25 mg via ORAL

## 2020-01-18 MED ORDER — DARATUMUMAB-HYALURONIDASE-FIHJ 1800-30000 MG-UT/15ML ~~LOC~~ SOLN
1800.0000 mg | Freq: Once | SUBCUTANEOUS | Status: AC
  Administered 2020-01-18: 1800 mg via SUBCUTANEOUS
  Filled 2020-01-18: qty 15

## 2020-01-18 MED ORDER — DEXAMETHASONE 4 MG PO TABS
ORAL_TABLET | ORAL | Status: AC
  Filled 2020-01-18: qty 5

## 2020-01-18 MED ORDER — ACETAMINOPHEN 325 MG PO TABS
ORAL_TABLET | ORAL | Status: AC
  Filled 2020-01-18: qty 2

## 2020-01-18 MED ORDER — DIPHENHYDRAMINE HCL 25 MG PO CAPS
ORAL_CAPSULE | ORAL | Status: AC
  Filled 2020-01-18: qty 2

## 2020-01-18 MED ORDER — SODIUM CHLORIDE 0.9 % IV SOLN
Freq: Once | INTRAVENOUS | Status: DC
  Filled 2020-01-18: qty 250

## 2020-01-18 NOTE — Progress Notes (Signed)
Hematology and Oncology Follow Up Visit  Vicki Moon [id number] [birth year] 69 y.o. 01/18/2020   Principle Diagnosis:  IgG Kappa myeloma -- 1p-, 13q-, 16q- and t(11:14)  Past Therapy: RVD -- start on 05/09/2019 -- d/c revlimid on 06/14/2019 -- d/c on 06/28/2019 S/P Kyphoplasty at L2 XRT to lumbar spine -- completed in [facility] -- startedon 07/12/2019- s/p cycle4 -- d/c on 10/19/2019 due to disease progression  Current Therapy:   Kyprolis/Faspro -- start on 10/26/2019 -- s/p cycle 3 Xgeva 120 mg sq q 3 months -- next dose on03/2021   Interim History:  Vicki Moon is here today for follow-up and treatment. She is doing fairly well but doe have fatigue. She states that she naps a lot throughout the day and only gets a few hours at night.  December M-spike was down to 0.9, IgG level 1,262 and kappa light chains 11.52 mg/dL.  She states that foods such as meat and eggs don't taste right and she has had less of an appetite. She is supplementing with Glucerna.  She states that she is hydrating well and her weight is stable.  No fever, chills, n/v, cough, rash, dizziness, SOB, chest pain, palpitations, abdominal pain or changes in bowel or bladder habits.  No episodes of bleeding. No bruising or petechiae.  No swelling, tenderness, numbness or tingling in her extremities.  No falls or syncope. She is ambulating with a cane for added support.   ECOG Performance Status: 1 - Symptomatic but completely ambulatory  Medications:  Allergies as of 01/18/2020      Reactions   Metformin Nausea Only   "severe GI"      Medication List       Accurate as of January 18, 2020  9:31 AM. If you have any questions, ask your nurse or doctor.        ALPRAZolam 0.5 MG tablet Commonly known as: XANAX Take 0.5 mg by mouth at bedtime as needed.   atorvastatin 10 MG tablet Commonly known as: LIPITOR Take 10 mg by mouth daily.   cyclobenzaprine 5 MG tablet Commonly known as:  FLEXERIL Take 1 tablet (5 mg total) by mouth 3 (three) times daily as needed.   FreeStyle [employer] Use one sensor every 10 days.   glipiZIDE 10 MG 24 hr tablet Commonly known as: GLUCOTROL XL Take 10 mg by mouth daily.   HYDROcodone-acetaminophen 5-325 MG tablet Commonly known as: NORCO/VICODIN Take 1 tablet by mouth every 6 (six) hours as needed for moderate pain. Received from [facility] ED.   ibuprofen 800 MG tablet Commonly known as: ADVIL Take 800 mg by mouth every 8 (eight) hours as needed.   latanoprost 0.005 % ophthalmic solution Commonly known as: XALATAN Place 1 drop into both eyes at bedtime.   lisinopril 10 MG tablet Commonly known as: ZESTRIL Take 10 mg by mouth daily.   NovoLOG 100 UNIT/ML injection Generic drug: insulin aspart Inject 1.2-25 Units into the skin as directed. PATIENT USES INSULIN PUMP  Patient has a set basal rate of 1.2-1.5u per hour with meal time boluses of up to 25 units   [place] 1 Device by Does not apply route 3 (three) times a week. Patient put on insulin pump every 2 to 3 days for insulin delivery   ondansetron 8 MG tablet Commonly known as: ZOFRAN Take 1 tablet (8 mg total) by mouth every 8 (eight) hours as needed for nausea or vomiting.   PARoxetine 30 MG  tablet Commonly known as: PAXIL Take 30 mg by mouth every morning.   potassium chloride SA 20 MEQ tablet Commonly known as: KLOR-CON Take 2 tablets (40 mEq total) by mouth daily.   PRESCRIPTION MEDICATION Onmipod dash-insulin pump.Changes every 2 days. 5-25units ac based on blood sugar.   prochlorperazine 10 MG tablet Commonly known as: COMPAZINE Take 1 tablet (10 mg total) by mouth every 6 (six) hours as needed (Nausea or vomiting).   temazepam 15 MG capsule Commonly known as: RESTORIL Take 1 capsule (15 mg total) by mouth at bedtime as needed for sleep.       Allergies:  Allergies  Allergen Reactions  . Metformin Nausea Only     "severe GI"    Past Medical History, Surgical history, Social history, and Family History were reviewed and updated.  Review of Systems: All other 10 point review of systems is negative.   Physical Exam:  vitals were not taken for this visit.   Wt Readings from Last 3 Encounters:  12/29/19 182 lb (82.6 kg)  12/21/19 186 lb 12.8 oz (84.7 kg)  11/23/19 190 lb 12.8 oz (86.5 kg)    Ocular: Sclerae unicteric, pupils equal, round and reactive to light Ear-nose-throat: Oropharynx clear, dentition fair Lymphatic: No cervical or supraclavicular adenopathy Lungs no rales or rhonchi, good excursion bilaterally Heart regular rate and rhythm, no murmur appreciated Abd soft, nontender, positive bowel sounds, no liver or spleen tip palpated on exam, no fluid wave  MSK no focal spinal tenderness, no joint edema Neuro: non-focal, well-oriented, appropriate affect Breasts: Deferred   Lab Results  Component Value Date   WBC 4.0 01/05/2020   HGB 8.7 (L) 01/05/2020   HCT 26.6 (L) 01/05/2020   MCV 100.4 (H) 01/05/2020   PLT 141 (L) 01/05/2020   Lab Results  Component Value Date   FERRITIN 754 (H) 12/28/2019   IRON 57 12/28/2019   TIBC 296 12/28/2019   UIBC 239 12/28/2019   IRONPCTSAT 19 (L) 12/28/2019   Lab Results  Component Value Date   RBC 2.65 (L) 01/05/2020   Lab Results  Component Value Date   KPAFRELGTCHN 115.2 (H) 12/21/2019   LAMBDASER 2.2 (L) 12/21/2019   KAPLAMBRATIO 52.36 (H) 12/21/2019   Lab Results  Component Value Date   IGGSERUM 1,262 12/21/2019   IGGSERUM 1,405 12/21/2019   IGA 8 (L) 12/21/2019   IGA 9 (L) 12/21/2019   IGMSERUM 11 (L) 12/21/2019   IGMSERUM 11 (L) 12/21/2019   Lab Results  Component Value Date   TOTALPROTELP 6.2 12/21/2019   ALBUMINELP 3.3 12/21/2019   A1GS 0.2 12/21/2019   A2GS 0.8 12/21/2019   BETS 0.8 12/21/2019   GAMS 1.0 12/21/2019   MSPIKE 0.9 (H) 12/21/2019   SPEI Comment 10/11/2019     Chemistry      Component Value  Date/Time   NA 142 01/05/2020 0911   K 3.3 (L) 01/05/2020 0911   CL 105 01/05/2020 0911   CO2 29 01/05/2020 0911   BUN 8 01/05/2020 0911   CREATININE 0.80 01/05/2020 0911   CREATININE 0.65 01/12/2018 1004      Component Value Date/Time   CALCIUM 8.6 (L) 01/05/2020 0911   ALKPHOS 99 01/05/2020 0911   AST 12 (L) 01/05/2020 0911   ALT 9 01/05/2020 0911   BILITOT 0.7 01/05/2020 0911       Impression and Plan: Ms. Wigle is a very pleasant 69 yo caucasian female with IgG kappa myeloma, cytogenetics = high risk.  Her M-spike is now  down to 0.9 and she continues to tolerate treatment nicely. We will proceed with cycle 4 today per Dr. [name].  Her iron saturation was low at her last visit so we will also give her Iv iron today.  Erythropoietin pending. Hgb today is 8.9, MCV 101.  We will plan to see her back in another month.  She will contact our office with any questions or concerns. We can certainly see her sooner if needed.   Laverna Peace, NP 1/27/20219:31 AM

## 2020-01-18 NOTE — Patient Instructions (Signed)
Daratumumab injection What is this medicine? DARATUMUMAB (dar a toom ue mab) is a monoclonal antibody. It is used to treat multiple myeloma. This medicine may be used for other purposes; ask your health care provider or pharmacist if you have questions. COMMON BRAND NAME(S): DARZALEX What should I tell my health care provider before I take this medicine? They need to know if you have any of these conditions:  infection (especially a virus infection such as chickenpox, herpes, or hepatitis B virus)  lung or breathing disease  an unusual or allergic reaction to daratumumab, other medicines, foods, dyes, or preservatives  pregnant or trying to get pregnant  breast-feeding How should I use this medicine? This medicine is for infusion into a vein. It is given by a health care professional in a hospital or clinic setting. Talk to your pediatrician regarding the use of this medicine in children. Special care may be needed. Overdosage: If you think you have taken too much of this medicine contact a poison control center or emergency room at once. NOTE: This medicine is only for you. Do not share this medicine with others. What if I miss a dose? Keep appointments for follow-up doses as directed. It is important not to miss your dose. Call your doctor or health care professional if you are unable to keep an appointment. What may interact with this medicine? Interactions have not been studied. This list may not describe all possible interactions. Give your health care provider a list of all the medicines, herbs, non-prescription drugs, or dietary supplements you use. Also tell them if you smoke, drink alcohol, or use illegal drugs. Some items may interact with your medicine. What should I watch for while using this medicine? This drug may make you feel generally unwell. Report any side effects. Continue your course of treatment even though you feel ill unless your doctor tells you to stop. This  medicine can cause serious allergic reactions. To reduce your risk you may need to take medicine before treatment with this medicine. Take your medicine as directed. This medicine can affect the results of blood tests to match your blood type. These changes can last for up to 6 months after the final dose. Your healthcare provider will do blood tests to match your blood type before you start treatment. Tell all of your healthcare providers that you are being treated with this medicine before receiving a blood transfusion. This medicine can affect the results of some tests used to determine treatment response; extra tests may be needed to evaluate response. Do not become pregnant while taking this medicine or for 3 months after stopping it. Women should inform their doctor if they wish to become pregnant or think they might be pregnant. There is a potential for serious side effects to an unborn child. Talk to your health care professional or pharmacist for more information. What side effects may I notice from receiving this medicine? Side effects that you should report to your doctor or health care professional as soon as possible:  allergic reactions like skin rash, itching or hives, swelling of the face, lips, or tongue  breathing problems  chills  cough  dizziness  feeling faint or lightheaded  headache  low blood counts - this medicine may decrease the number of white blood cells, red blood cells and platelets. You may be at increased risk for infections and bleeding.  nausea, vomiting  shortness of breath  signs of decreased platelets or bleeding - bruising, pinpoint red spots on  the skin, black, tarry stools, blood in the urine  signs of decreased red blood cells - unusually weak or tired, feeling faint or lightheaded, falls  signs of infection - fever or chills, cough, sore throat, pain or difficulty passing urine  signs and symptoms of liver injury like dark yellow or brown  urine; general ill feeling or flu-like symptoms; light-colored stools; loss of appetite; right upper belly pain; unusually weak or tired; yellowing of the eyes or skin Side effects that usually do not require medical attention (report to your doctor or health care professional if they continue or are bothersome):  back pain  constipation  diarrhea  joint pain  muscle cramps  pain, tingling, numbness in the hands or feet  swelling of the ankles, feet, hands  tiredness  trouble sleeping This list may not describe all possible side effects. Call your doctor for medical advice about side effects. You may report side effects to FDA at 1-800-FDA-1088. Where should I keep my medicine? This drug is given in a hospital or clinic and will not be stored at home. NOTE: This sheet is a summary. It may not cover all possible information. If you have questions about this medicine, talk to your doctor, pharmacist, or health care provider.  2020 Elsevier/Gold Standard (2019-08-16 18:10:54) Ferumoxytol injection What is this medicine? FERUMOXYTOL is an iron complex. Iron is used to make healthy red blood cells, which carry oxygen and nutrients throughout the body. This medicine is used to treat iron deficiency anemia. This medicine may be used for other purposes; ask your health care provider or pharmacist if you have questions. COMMON BRAND NAME(S): Feraheme What should I tell my health care provider before I take this medicine? They need to know if you have any of these conditions:  anemia not caused by low iron levels  high levels of iron in the blood  magnetic resonance imaging (MRI) test scheduled  an unusual or allergic reaction to iron, other medicines, foods, dyes, or preservatives  pregnant or trying to get pregnant  breast-feeding How should I use this medicine? This medicine is for injection into a vein. It is given by a health care professional in a hospital or clinic  setting. Talk to your pediatrician regarding the use of this medicine in children. Special care may be needed. Overdosage: If you think you have taken too much of this medicine contact a poison control center or emergency room at once. NOTE: This medicine is only for you. Do not share this medicine with others. What if I miss a dose? It is important not to miss your dose. Call your doctor or health care professional if you are unable to keep an appointment. What may interact with this medicine? This medicine may interact with the following medications:  other iron products This list may not describe all possible interactions. Give your health care provider a list of all the medicines, herbs, non-prescription drugs, or dietary supplements you use. Also tell them if you smoke, drink alcohol, or use illegal drugs. Some items may interact with your medicine. What should I watch for while using this medicine? Visit your doctor or healthcare professional regularly. Tell your doctor or healthcare professional if your symptoms do not start to get better or if they get worse. You may need blood work done while you are taking this medicine. You may need to follow a special diet. Talk to your doctor. Foods that contain iron include: whole grains/cereals, dried fruits, beans, or peas, leafy  green vegetables, and organ meats (liver, kidney). What side effects may I notice from receiving this medicine? Side effects that you should report to your doctor or health care professional as soon as possible:  allergic reactions like skin rash, itching or hives, swelling of the face, lips, or tongue  breathing problems  changes in blood pressure  feeling faint or lightheaded, falls  fever or chills  flushing, sweating, or hot feelings  swelling of the ankles or feet Side effects that usually do not require medical attention (report to your doctor or health care professional if they continue or are  bothersome):  diarrhea  headache  nausea, vomiting  stomach pain This list may not describe all possible side effects. Call your doctor for medical advice about side effects. You may report side effects to FDA at 1-800-FDA-1088. Where should I keep my medicine? This drug is given in a hospital or clinic and will not be stored at home. NOTE: This sheet is a summary. It may not cover all possible information. If you have questions about this medicine, talk to your doctor, pharmacist, or health care provider.  2020 Elsevier/Gold Standard (2017-01-26 20:21:10) Carfilzomib injection What is this medicine? CARFILZOMIB (kar FILZ oh mib) targets a specific protein within cancer cells and stops the cancer cells from growing. It is used to treat multiple myeloma. This medicine may be used for other purposes; ask your health care provider or pharmacist if you have questions. COMMON BRAND NAME(S): KYPROLIS What should I tell my health care provider before I take this medicine? They need to know if you have any of these conditions:  heart disease  history of blood clots  irregular heartbeat  kidney disease  liver disease  lung or breathing disease  an unusual or allergic reaction to carfilzomib, or other medicines, foods, dyes, or preservatives  pregnant or trying to get pregnant  breast-feeding How should I use this medicine? This medicine is for injection or infusion into a vein. It is given by a health care professional in a hospital or clinic setting. Talk to your pediatrician regarding the use of this medicine in children. Special care may be needed. Overdosage: If you think you have taken too much of this medicine contact a poison control center or emergency room at once. NOTE: This medicine is only for you. Do not share this medicine with others. What if I miss a dose? It is important not to miss your dose. Call your doctor or health care professional if you are unable to keep  an appointment. What may interact with this medicine? Interactions are not expected. Give your health care provider a list of all the medicines, herbs, non-prescription drugs, or dietary supplements you use. Also tell them if you smoke, drink alcohol, or use illegal drugs. Some items may interact with your medicine. This list may not describe all possible interactions. Give your health care provider a list of all the medicines, herbs, non-prescription drugs, or dietary supplements you use. Also tell them if you smoke, drink alcohol, or use illegal drugs. Some items may interact with your medicine. What should I watch for while using this medicine? Your condition will be monitored carefully while you are receiving this medicine. Report any side effects. Continue your course of treatment even though you feel ill unless your doctor tells you to stop. You may need blood work done while you are taking this medicine. Do not become pregnant while taking this medicine or for at least 6 months after  stopping it. Women should inform their doctor if they wish to become pregnant or think they might be pregnant. There is a potential for serious side effects to an unborn child. Men should not father a child while taking this medicine and for at least 3 months after stopping it. Talk to your health care professional or pharmacist for more information. Do not breast-feed an infant while taking this medicine or for 2 weeks after the last dose. Check with your doctor or health care professional if you get an attack of severe diarrhea, nausea and vomiting, or if you sweat a lot. The loss of too much body fluid can make it dangerous for you to take this medicine. You may get dizzy. Do not drive, use machinery, or do anything that needs mental alertness until you know how this medicine affects you. Do not stand or sit up quickly, especially if you are an older patient. This reduces the risk of dizzy or fainting spells. What  side effects may I notice from receiving this medicine? Side effects that you should report to your doctor or health care professional as soon as possible:  allergic reactions like skin rash, itching or hives, swelling of the face, lips, or tongue  confusion  dizziness  feeling faint or lightheaded  fever or chills  palpitations  seizures  signs and symptoms of bleeding such as bloody or black, tarry stools; red or dark-brown urine; spitting up blood or brown material that looks like coffee grounds; red spots on the skin; unusual bruising or bleeding including from the eye, gums, or nose  signs and symptoms of a blood clot such as breathing problems; changes in vision; chest pain; severe, sudden headache; pain, swelling, warmth in the leg; trouble speaking; sudden numbness or weakness of the face, arm or leg  signs and symptoms of kidney injury like trouble passing urine or change in the amount of urine  signs and symptoms of liver injury like dark yellow or brown urine; general ill feeling or flu-like symptoms; light-colored stools; loss of appetite; nausea; right upper belly pain; unusually weak or tired; yellowing of the eyes or skin Side effects that usually do not require medical attention (report to your doctor or health care professional if they continue or are bothersome):  back pain  cough  diarrhea  headache  muscle cramps  trouble sleeping  vomiting This list may not describe all possible side effects. Call your doctor for medical advice about side effects. You may report side effects to FDA at 1-800-FDA-1088. Where should I keep my medicine? This drug is given in a hospital or clinic and will not be stored at home. NOTE: This sheet is a summary. It may not cover all possible information. If you have questions about this medicine, talk to your doctor, pharmacist, or health care provider.  2020 Elsevier/Gold Standard (2019-08-15 19:44:21)

## 2020-01-18 NOTE — Addendum Note (Signed)
Addended by: Burney Gauze R on: 01/18/2020 10:45 AM   Modules accepted: Orders

## 2020-01-19 LAB — IGG, IGA, IGM
IgA: 8 mg/dL — ABNORMAL LOW (ref 87–352)
IgG (Immunoglobin G), Serum: 1421 mg/dL (ref 586–1602)
IgM (Immunoglobulin M), Srm: 8 mg/dL — ABNORMAL LOW (ref 26–217)

## 2020-01-19 LAB — KAPPA/LAMBDA LIGHT CHAINS
Kappa free light chain: 212.3 mg/L — ABNORMAL HIGH (ref 3.3–19.4)
Kappa, lambda light chain ratio: 81.65 — ABNORMAL HIGH (ref 0.26–1.65)
Lambda free light chains: 2.6 mg/L — ABNORMAL LOW (ref 5.7–26.3)

## 2020-01-19 LAB — PROTEIN ELECTROPHORESIS, SERUM
A/G Ratio: 1.1 (ref 0.7–1.7)
Albumin ELP: 3.5 g/dL (ref 2.9–4.4)
Alpha-1-Globulin: 0.3 g/dL (ref 0.0–0.4)
Alpha-2-Globulin: 0.7 g/dL (ref 0.4–1.0)
Beta Globulin: 0.9 g/dL (ref 0.7–1.3)
Gamma Globulin: 1.3 g/dL (ref 0.4–1.8)
Globulin, Total: 3.2 g/dL (ref 2.2–3.9)
M-Spike, %: 1.2 g/dL — ABNORMAL HIGH
Total Protein ELP: 6.7 g/dL (ref 6.0–8.5)

## 2020-01-19 LAB — ERYTHROPOIETIN: Erythropoietin: 87.7 m[IU]/mL — ABNORMAL HIGH (ref 2.6–18.5)

## 2020-01-23 DIAGNOSIS — E119 Type 2 diabetes mellitus without complications: Secondary | ICD-10-CM | POA: Diagnosis not present

## 2020-01-23 DIAGNOSIS — E1165 Type 2 diabetes mellitus with hyperglycemia: Secondary | ICD-10-CM | POA: Diagnosis not present

## 2020-01-23 DIAGNOSIS — E1169 Type 2 diabetes mellitus with other specified complication: Secondary | ICD-10-CM | POA: Diagnosis not present

## 2020-01-23 DIAGNOSIS — I10 Essential (primary) hypertension: Secondary | ICD-10-CM | POA: Diagnosis not present

## 2020-01-23 DIAGNOSIS — E7849 Other hyperlipidemia: Secondary | ICD-10-CM | POA: Diagnosis not present

## 2020-01-23 LAB — IMMUNOFIXATION REFLEX, SERUM
IgA: 6 mg/dL — ABNORMAL LOW (ref 87–352)
IgG (Immunoglobin G), Serum: 1937 mg/dL — ABNORMAL HIGH (ref 586–1602)
IgM (Immunoglobulin M), Srm: 11 mg/dL — ABNORMAL LOW (ref 26–217)

## 2020-01-23 LAB — PROTEIN ELECTROPHORESIS, SERUM, WITH REFLEX
A/G Ratio: 1.1 (ref 0.7–1.7)
Albumin ELP: 3.4 g/dL (ref 2.9–4.4)
Alpha-1-Globulin: 0.3 g/dL (ref 0.0–0.4)
Alpha-2-Globulin: 0.7 g/dL (ref 0.4–1.0)
Beta Globulin: 0.9 g/dL (ref 0.7–1.3)
Gamma Globulin: 1.3 g/dL (ref 0.4–1.8)
Globulin, Total: 3.2 g/dL (ref 2.2–3.9)
M-Spike, %: 1.2 g/dL — ABNORMAL HIGH
SPEP Interpretation: 0
Total Protein ELP: 6.6 g/dL (ref 6.0–8.5)

## 2020-01-25 ENCOUNTER — Inpatient Hospital Stay: Payer: Medicare Other

## 2020-01-25 ENCOUNTER — Inpatient Hospital Stay: Payer: Medicare Other | Attending: Hematology & Oncology | Admitting: Hematology & Oncology

## 2020-01-25 ENCOUNTER — Encounter: Payer: Self-pay | Admitting: Hematology & Oncology

## 2020-01-25 DIAGNOSIS — E119 Type 2 diabetes mellitus without complications: Secondary | ICD-10-CM | POA: Insufficient documentation

## 2020-01-25 DIAGNOSIS — Z79899 Other long term (current) drug therapy: Secondary | ICD-10-CM | POA: Insufficient documentation

## 2020-01-25 DIAGNOSIS — Z794 Long term (current) use of insulin: Secondary | ICD-10-CM | POA: Insufficient documentation

## 2020-01-25 DIAGNOSIS — C9000 Multiple myeloma not having achieved remission: Secondary | ICD-10-CM | POA: Diagnosis not present

## 2020-01-25 DIAGNOSIS — R110 Nausea: Secondary | ICD-10-CM | POA: Insufficient documentation

## 2020-01-25 DIAGNOSIS — D649 Anemia, unspecified: Secondary | ICD-10-CM | POA: Diagnosis not present

## 2020-01-25 DIAGNOSIS — D631 Anemia in chronic kidney disease: Secondary | ICD-10-CM

## 2020-01-25 HISTORY — DX: Anemia in chronic kidney disease: D63.1

## 2020-01-25 MED ORDER — SELINEXOR (100 MG ONCE WEEKLY) 20 MG PO TBPK: 100.0000 mg | ORAL_TABLET | ORAL | 4 refills | Status: DC

## 2020-01-25 NOTE — Progress Notes (Signed)
Hematology and Oncology Follow Up Visit  Vicki Moon [id number] 09-10-1951 69 y.o. 01/25/2020   Principle Diagnosis:  IgG Kappa myeloma -- 1p-, 13q-, 16q- and t(11:14)  Past Therapy: RVD -- start on 05/09/2019 -- d/c revlimid on 06/14/2019 -- d/c on 06/28/2019 S/P Kyphoplasty at L2 XRT to lumbar spine -- completed in [facility] -- startedon 07/12/2019- s/p cycle4 -- d/c on 10/19/2019 due to disease progression  Current Therapy:   Selinexor/Velcade -- start cycle #1 on 02/06/2020 Kyprolis/Faspro -- start on 10/26/2019 -- s/p cycle #4 -- d/c due to progression Xgeva 120 mg sq q 3 months -- next dose on03/2021   Interim History:  Vicki Moon is here today for an unexpected visit.  Unfortunately, we now see that she is progressing.  She had been doing well with the Darzalex.  However, with her last visit, we found that her M spike went up to 1.2 g/dL.  Her IgG level went up to 1940 mg/dL.  The kappa light chain was up to 21.2 mg/dL.  Clearly, she has bad myeloma.  She clearly has high risk myeloma.  I have to believe that these chromosome abnormalities are causing the problems for [place].  She is not a candidate for any transplant.  I am not sure that she would be a candidate for CAR-T therapy.  I think that we should try Selinexor.  I think the BOSTON trial which showed the combination of Selinexor with Velcade was a very active protocol would make sense to me.  I do not think that we really can give her Decadron with this because of her diabetes.  I just feel bad for her.  She is doing everything that we have asked her to do.  It is just that her myeloma clearly has figured out a way to develop a resistance fairly quickly.  I really thought that the daratumumab would be a very active treatment for her.  We are giving it with Kyprolis.  For right now, we will try to get the selinexor for her.  According to the BOSTON trial, the dose is 100 mg a week.  With Velcade, it is  1.3 mg/meter2 once a week.  We might be able to adjust this a little bit.  She feels okay.  She does have some anemia.  A lot of this might be from her diabetes.  Her erythropoietin level is only 88.  We might be able to utilize ESA to help with her anemia.  Thankfully, she has not had any problems with recurrences of her abscesses which are staph aureus.  At this point, her performance status is ECOG 1. Medications:  Allergies as of 01/25/2020      Reactions   Metformin Diarrhea, Nausea Only      Medication List       Accurate as of January 25, 2020  3:01 PM. If you have any questions, ask your nurse or doctor.        ALPRAZolam 0.5 MG tablet Commonly known as: XANAX Take 0.5 mg by mouth at bedtime as needed.   atorvastatin 10 MG tablet Commonly known as: LIPITOR Take 10 mg by mouth daily.   cyclobenzaprine 5 MG tablet Commonly known as: FLEXERIL Take 1 tablet (5 mg total) by mouth 3 (three) times daily as needed.   FreeStyle [employer] Use one sensor every 10 days.   glipiZIDE 10 MG 24 hr tablet Commonly known as: GLUCOTROL XL Take 10 mg by mouth daily.  HYDROcodone-acetaminophen 5-325 MG tablet Commonly known as: NORCO/VICODIN Take 1 tablet by mouth every 6 (six) hours as needed for moderate pain. Received from [facility] ED.   ibuprofen 800 MG tablet Commonly known as: ADVIL Take 800 mg by mouth every 8 (eight) hours as needed.   latanoprost 0.005 % ophthalmic solution Commonly known as: XALATAN Place 1 drop into both eyes at bedtime.   lisinopril 10 MG tablet Commonly known as: ZESTRIL Take 10 mg by mouth daily.   NovoLOG 100 UNIT/ML injection Generic drug: insulin aspart Inject 1.2-25 Units into the skin as directed. PATIENT USES INSULIN PUMP  Patient has a set basal rate of 1.2-1.5u per hour with meal time boluses of up to 25 units   [place] 1 Device by Does not apply route 3 (three) times a week. Patient put on  insulin pump every 2 to 3 days for insulin delivery   ondansetron 8 MG tablet Commonly known as: ZOFRAN Take 1 tablet (8 mg total) by mouth every 8 (eight) hours as needed for nausea or vomiting.   PARoxetine 30 MG tablet Commonly known as: PAXIL Take 30 mg by mouth every morning.   potassium chloride SA 20 MEQ tablet Commonly known as: KLOR-CON Take 2 tablets (40 mEq total) by mouth daily.   PRESCRIPTION MEDICATION Onmipod dash-insulin pump.Changes every 2 days. 5-25units ac based on blood sugar.   prochlorperazine 10 MG tablet Commonly known as: COMPAZINE Take 1 tablet (10 mg total) by mouth every 6 (six) hours as needed (Nausea or vomiting).   temazepam 15 MG capsule Commonly known as: RESTORIL Take 1 capsule (15 mg total) by mouth at bedtime as needed for sleep.       Allergies:  Allergies  Allergen Reactions  . Metformin Diarrhea and Nausea Only    Past Medical History, Surgical history, Social history, and Family History were reviewed and updated.  Review of Systems: Review of Systems  Constitutional: Negative.   HENT: Negative.   Eyes: Negative.   Respiratory: Negative.   Cardiovascular: Negative.   Gastrointestinal: Negative.   Genitourinary: Negative.   Musculoskeletal: Negative.   Skin: Negative.   Neurological: Negative.   Endo/Heme/Allergies: Negative.   Psychiatric/Behavioral: Negative.      Physical Exam:  vitals were not taken for this visit.   Wt Readings from Last 3 Encounters:  01/18/20 182 lb 12.8 oz (82.9 kg)  12/29/19 182 lb (82.6 kg)  12/21/19 186 lb 12.8 oz (84.7 kg)    Physical Exam Vitals reviewed.  HENT:     Head: Normocephalic and atraumatic.  Eyes:     Pupils: Pupils are equal, round, and reactive to light.  Cardiovascular:     Rate and Rhythm: Normal rate and regular rhythm.     Heart sounds: Normal heart sounds.  Pulmonary:     Effort: Pulmonary effort is normal.     Breath sounds: Normal breath sounds.    Abdominal:     General: Bowel sounds are normal.     Palpations: Abdomen is soft.  Musculoskeletal:        General: No tenderness or deformity. Normal range of motion.     Cervical back: Normal range of motion.  Lymphadenopathy:     Cervical: No cervical adenopathy.  Skin:    General: Skin is warm and dry.     Findings: No erythema or rash.  Neurological:     Mental Status: She is alert and oriented to person, place, and time.  Psychiatric:  Behavior: Behavior normal.        Thought Content: Thought content normal.        Judgment: Judgment normal.      Lab Results  Component Value Date   WBC 4.3 01/18/2020   HGB 8.9 (L) 01/18/2020   HCT 28.6 (L) 01/18/2020   MCV 101.1 (H) 01/18/2020   PLT 197 01/18/2020   Lab Results  Component Value Date   FERRITIN 754 (H) 12/28/2019   IRON 57 12/28/2019   TIBC 296 12/28/2019   UIBC 239 12/28/2019   IRONPCTSAT 19 (L) 12/28/2019   Lab Results  Component Value Date   RBC 2.83 (L) 01/18/2020   Lab Results  Component Value Date   KPAFRELGTCHN 212.3 (H) 01/18/2020   LAMBDASER 2.6 (L) 01/18/2020   KAPLAMBRATIO 81.65 (H) 01/18/2020   Lab Results  Component Value Date   IGGSERUM 1,937 (H) 01/18/2020   IGA 6 (L) 01/18/2020   IGMSERUM 11 (L) 01/18/2020   Lab Results  Component Value Date   TOTALPROTELP 6.7 01/18/2020   TOTALPROTELP 6.6 01/18/2020   ALBUMINELP 3.5 01/18/2020   ALBUMINELP 3.4 01/18/2020   A1GS 0.3 01/18/2020   A1GS 0.3 01/18/2020   A2GS 0.7 01/18/2020   A2GS 0.7 01/18/2020   BETS 0.9 01/18/2020   BETS 0.9 01/18/2020   GAMS 1.3 01/18/2020   GAMS 1.3 01/18/2020   MSPIKE 1.2 (H) 01/18/2020   MSPIKE 1.2 (H) 01/18/2020   SPEI Comment 01/18/2020     Chemistry      Component Value Date/Time   NA 141 01/18/2020 0911   K 3.6 01/18/2020 0911   CL 108 01/18/2020 0911   CO2 25 01/18/2020 0911   BUN 9 01/18/2020 0911   CREATININE 0.79 01/18/2020 0911   CREATININE 0.65 01/12/2018 1004      Component  Value Date/Time   CALCIUM 8.7 (L) 01/18/2020 0911   ALKPHOS 94 01/18/2020 0911   AST 13 (L) 01/18/2020 0911   ALT 8 01/18/2020 0911   BILITOT 0.5 01/18/2020 0911       Impression and Plan: Vicki Moon is a very pleasant 69 yo caucasian female with IgG kappa myeloma, with what I would consider to have high risk cytogenetics.  Again, she appears a very developed a resilience to the daratumumab.  I think that the Selinexor/Velcade protocol is very reasonable.  I think she could tolerate this.  I spent about 45 minutes with her.  I talked to her at length about the change in her M spike and the fact that her myeloma appears to be developing resistance.    Not sure how long it would take to get the Selinexor.  We will utilize our wonderful oral chemotherapy pharmacist, Alyson, to help [place] out.  Once I know when we can get the Selinexor, we will then plan to get the Velcade going.  This is very complicated.  She clearly has disease that is difficult to treat.  Thankfully, her numbers/myeloma value is are not that bad as of yet.  I realize that she does have other health issues.  Diabetes clearly is the biggest problem that she has.  We will plan to have her come back to see [place] in about 3 or 4 weeks.  [name], MD 2/3/20213:01 PM

## 2020-01-25 NOTE — Progress Notes (Signed)
DISCONTINUE ON PATHWAY REGIMEN - Multiple Myeloma and Other Plasma Cell Dyscrasias     Cycles 1 and 2: A cycle is every 28 days:     Daratumumab and hyaluronidase-fihj    Cycles 3 through 6: A cycle is every 28 days:     Daratumumab and hyaluronidase-fihj    Cycles 7 and beyond: A cycle is every 28 days:     Daratumumab and hyaluronidase-fihj   **Always confirm dose/schedule in your pharmacy ordering system**  REASON: Disease Progression PRIOR TREATMENT: [id number]: Daratumumab/hyaluronidase SUBQ (1,800/30,000) q28 Days Until Progression or Unacceptable Toxicity TREATMENT RESPONSE: Partial Response (PR)  START ON PATHWAY REGIMEN - Multiple Myeloma and Other Plasma Cell Dyscrasias     A cycle is every 21 days:     Bortezomib      Dexamethasone   **Always confirm dose/schedule in your pharmacy ordering system**  Patient Characteristics: Multiple Myeloma, Relapsed / Refractory, Second through Fourth Lines of Therapy Disease Classification: Multiple Myeloma R-ISS Staging: III Therapeutic Status: Refractory Line of Therapy: Third Line Intent of Therapy: Non-Curative / Palliative Intent, Discussed with Patient

## 2020-01-25 NOTE — Patient Instructions (Signed)
No treatment. Follow up as scheduled.

## 2020-01-25 NOTE — Progress Notes (Signed)
Per MD, no treatment today.

## 2020-01-26 ENCOUNTER — Telehealth: Payer: Self-pay | Admitting: Pharmacy Technician

## 2020-01-26 NOTE — Telephone Encounter (Signed)
Oral Oncology Patient Advocate Encounter   Received notification from [facility] that prior authorization for Xpovio is required.   PA submitted on CoverMyMeds Key [id number]  Status is pending   Oral Oncology Clinic will continue to follow.  [place] Patient [place] Phone [phone] Fax [phone] 01/30/2020 8:41 AM

## 2020-01-30 NOTE — Telephone Encounter (Addendum)
Oral Oncology Patient Advocate Encounter  Prior Authorization for Vicki Moon has been approved.    PA# [id number] Effective dates: 01/25/2020 until further notice.  Copay is $8.58.  Rx cannot be filled at [facility] due to lack of access.  Will have Rx sent to [facility].  Oral Oncology Clinic will continue to follow.   [place] Patient [place] Phone [phone] Fax [phone] 01/30/2020 12:45 PM

## 2020-01-31 ENCOUNTER — Telehealth: Payer: Self-pay | Admitting: Pharmacist

## 2020-01-31 DIAGNOSIS — C9000 Multiple myeloma not having achieved remission: Secondary | ICD-10-CM

## 2020-01-31 MED ORDER — SELINEXOR (100 MG ONCE WEEKLY) 20 MG PO TBPK: 100.0000 mg | ORAL_TABLET | ORAL | 4 refills | Status: DC

## 2020-01-31 NOTE — Telephone Encounter (Signed)
Oral Oncology Pharmacist Encounter  Received new prescription for Xpovio (selinexor) for the treatment of multiple myeloma in conjunction with bortezomib and dexamethasone, planned duration until disease progression or unacceptable drug toxicity.  CBC from 01/18/20 assessed, no relevant lab abnormalities. Prescription dose and frequency assessed.   Current medication list in Epic reviewed, no relevant DDIs with selinexor identified.  Prescription has been e-scribed to [facility] due to limited distribution for benefits analysis and approval.  Oral Oncology Clinic will continue to follow for insurance authorization, copayment issues, initial counseling and start date.  Darl Pikes, PharmD, BCPS, [facility] Hematology/Oncology Clinical Pharmacist [facility] Oral [facility] [phone]  01/31/2020 9:45 AM

## 2020-02-01 ENCOUNTER — Inpatient Hospital Stay: Payer: Medicare Other

## 2020-02-02 ENCOUNTER — Telehealth: Payer: Self-pay | Admitting: *Deleted

## 2020-02-02 ENCOUNTER — Encounter: Payer: Self-pay | Admitting: *Deleted

## 2020-02-02 NOTE — Telephone Encounter (Signed)
Spoke with patient. Per dr [name] she is to start Selinexor on Monday 02/06/2020. Patient verbalized understanding.

## 2020-02-15 ENCOUNTER — Other Ambulatory Visit: Payer: Self-pay

## 2020-02-15 ENCOUNTER — Encounter: Payer: Self-pay | Admitting: Hematology & Oncology

## 2020-02-15 ENCOUNTER — Inpatient Hospital Stay (HOSPITAL_BASED_OUTPATIENT_CLINIC_OR_DEPARTMENT_OTHER): Payer: Medicare Other | Admitting: Hematology & Oncology

## 2020-02-15 ENCOUNTER — Inpatient Hospital Stay: Payer: Medicare Other

## 2020-02-15 VITALS — BP 121/54 | HR 79 | Temp 97.6°F | Resp 20 | Wt 167.0 lb

## 2020-02-15 DIAGNOSIS — R110 Nausea: Secondary | ICD-10-CM | POA: Diagnosis not present

## 2020-02-15 DIAGNOSIS — Z79899 Other long term (current) drug therapy: Secondary | ICD-10-CM | POA: Diagnosis not present

## 2020-02-15 DIAGNOSIS — D649 Anemia, unspecified: Secondary | ICD-10-CM | POA: Diagnosis not present

## 2020-02-15 DIAGNOSIS — C9000 Multiple myeloma not having achieved remission: Secondary | ICD-10-CM

## 2020-02-15 DIAGNOSIS — E119 Type 2 diabetes mellitus without complications: Secondary | ICD-10-CM | POA: Diagnosis not present

## 2020-02-15 DIAGNOSIS — Z794 Long term (current) use of insulin: Secondary | ICD-10-CM | POA: Diagnosis not present

## 2020-02-15 LAB — CMP (CANCER CENTER ONLY)
ALT: 28 U/L (ref 0–44)
AST: 34 U/L (ref 15–41)
Albumin: 3.6 g/dL (ref 3.5–5.0)
Alkaline Phosphatase: 78 U/L (ref 38–126)
Anion gap: 7 (ref 5–15)
BUN: 16 mg/dL (ref 8–23)
CO2: 25 mmol/L (ref 22–32)
Calcium: 8.5 mg/dL — ABNORMAL LOW (ref 8.9–10.3)
Chloride: 101 mmol/L (ref 98–111)
Creatinine: 1.08 mg/dL — ABNORMAL HIGH (ref 0.44–1.00)
GFR, Est AFR Am: >60 mL/min (ref >60)
GFR, Estimated: 52 mL/min — ABNORMAL LOW (ref >60)
Glucose, Bld: 157 mg/dL — ABNORMAL HIGH (ref 70–99)
Potassium: 3.3 mmol/L — ABNORMAL LOW (ref 3.5–5.1)
Sodium: 133 mmol/L — ABNORMAL LOW (ref 135–145)
Total Bilirubin: 0.4 mg/dL (ref 0.3–1.2)
Total Protein: 8.2 g/dL — ABNORMAL HIGH (ref 6.5–8.1)

## 2020-02-15 LAB — IRON AND TIBC
Iron: 155 ug/dL — ABNORMAL HIGH (ref 41–142)
Saturation Ratios: 57 % (ref 21–57)
TIBC: 271 ug/dL (ref 236–444)
UIBC: 116 ug/dL — ABNORMAL LOW (ref 120–384)

## 2020-02-15 LAB — CBC WITH DIFFERENTIAL (CANCER CENTER ONLY)
Abs Immature Granulocytes: 0.01 10*3/uL (ref 0.00–0.07)
Basophils Absolute: 0 10*3/uL (ref 0.0–0.1)
Basophils Relative: 0 %
Eosinophils Absolute: 0 10*3/uL (ref 0.0–0.5)
Eosinophils Relative: 0 %
HCT: 32 % — ABNORMAL LOW (ref 36.0–46.0)
Hemoglobin: 10.4 g/dL — ABNORMAL LOW (ref 12.0–15.0)
Immature Granulocytes: 1 %
Lymphocytes Relative: 22 %
Lymphs Abs: 0.3 10*3/uL — ABNORMAL LOW (ref 0.7–4.0)
MCH: 32.3 pg (ref 26.0–34.0)
MCHC: 32.5 g/dL (ref 30.0–36.0)
MCV: 99.4 fL (ref 80.0–100.0)
Monocytes Absolute: 0.2 10*3/uL (ref 0.1–1.0)
Monocytes Relative: 12 %
Neutro Abs: 1 10*3/uL — ABNORMAL LOW (ref 1.7–7.7)
Neutrophils Relative %: 65 %
Platelet Count: 119 10*3/uL — ABNORMAL LOW (ref 150–400)
RBC: 3.22 MIL/uL — ABNORMAL LOW (ref 3.87–5.11)
RDW: 16.3 % — ABNORMAL HIGH (ref 11.5–15.5)
WBC Count: 1.5 10*3/uL — ABNORMAL LOW (ref 4.0–10.5)
nRBC: 2.7 % — ABNORMAL HIGH (ref 0.0–0.2)

## 2020-02-15 LAB — RETICULOCYTES
Immature Retic Fract: 9.6 % (ref 2.3–15.9)
RBC.: 3.21 MIL/uL — ABNORMAL LOW (ref 3.87–5.11)
Retic Count, Absolute: 38.5 10*3/uL (ref 19.0–186.0)
Retic Ct Pct: 1.2 % (ref 0.4–3.1)

## 2020-02-15 LAB — LACTATE DEHYDROGENASE: LDH: 286 U/L — ABNORMAL HIGH (ref 98–192)

## 2020-02-15 LAB — FERRITIN: Ferritin: 1606 ng/mL — ABNORMAL HIGH (ref 11–307)

## 2020-02-15 MED ORDER — SODIUM CHLORIDE 0.9 % IV SOLN
INTRAVENOUS | Status: AC
  Filled 2020-02-15: qty 250

## 2020-02-15 MED ORDER — PANTOPRAZOLE SODIUM 40 MG PO TBEC: 40.0000 mg | DELAYED_RELEASE_TABLET | Freq: Every day | ORAL | 3 refills | Status: DC

## 2020-02-15 MED ORDER — SODIUM CHLORIDE 0.9 % IV SOLN
20.0000 mg | Freq: Once | INTRAVENOUS | Status: AC
  Administered 2020-02-15: 20 mg via INTRAVENOUS
  Filled 2020-02-15: qty 2

## 2020-02-15 MED ORDER — SODIUM CHLORIDE 0.9 % IV SOLN
40.0000 mg | Freq: Once | INTRAVENOUS | Status: AC
  Administered 2020-02-15: 40 mg via INTRAVENOUS
  Filled 2020-02-15: qty 4

## 2020-02-15 MED ORDER — ONDANSETRON HCL 4 MG/2ML IJ SOLN
8.0000 mg | Freq: Once | INTRAMUSCULAR | Status: AC
  Administered 2020-02-15: 8 mg via INTRAVENOUS

## 2020-02-15 MED ORDER — GRANISETRON 3.1 MG/24HR TD PTCH: MEDICATED_PATCH | TRANSDERMAL | 3 refills | Status: DC

## 2020-02-15 MED ORDER — ONDANSETRON HCL 4 MG/2ML IJ SOLN
INTRAMUSCULAR | Status: AC
  Filled 2020-02-15: qty 4

## 2020-02-15 NOTE — Patient Instructions (Signed)
Dehydration, Adult Dehydration is a condition in which there is not enough water or other fluids in the body. This happens when a person loses more fluids than he or she takes in. Important organs, such as the kidneys, brain, and heart, cannot function without a proper amount of fluids. Any loss of fluids from the body can lead to dehydration. Dehydration can be mild, moderate, or severe. It should be treated right away to prevent it from becoming severe. What are the causes? Dehydration may be caused by:  Conditions that cause loss of water or other fluids, such as diarrhea, vomiting, or sweating or urinating a lot.  Not drinking enough fluids, especially when you are ill or doing activities that require a lot of energy.  Other illnesses and conditions, such as fever or infection.  Certain medicines, such as medicines that remove excess fluid from the body (diuretics).  Lack of safe drinking water.  Not being able to get enough water and food. What increases the risk? The following factors may make you more likely to develop this condition:  Having a long-term (chronic) illness that has not been treated properly, such as diabetes, heart disease, or kidney disease.  Being 65 years of age or older.  Having a disability.  Living in a place that is high in altitude, where thinner, drier air causes more fluid loss.  Doing exercises that put stress on your body for a long time (endurance sports). What are the signs or symptoms? Symptoms of dehydration depend on how severe it is. Mild or moderate dehydration  Thirst.  Dry lips or dry mouth.  Dizziness or light-headedness, especially when standing up from a seated position.  Muscle cramps.  Dark urine. Urine may be the color of tea.  Less urine or tears produced than usual.  Headache. Severe dehydration  Changes in skin. Your skin may be cold and clammy, blotchy, or pale. Your skin also may not return to normal after being  lightly pinched and released.  Little or no tears, urine, or sweat.  Changes in vital signs, such as rapid breathing and low blood pressure. Your pulse may be weak or may be faster than 100 beats a minute when you are sitting still.  Other changes, such as: ? Feeling very thirsty. ? Sunken eyes. ? Cold hands and feet. ? Confusion. ? Being very tired (lethargic) or having trouble waking from sleep. ? Short-term weight loss. ? Loss of consciousness. How is this diagnosed? This condition is diagnosed based on your symptoms and a physical exam. You may have blood and urine tests to help confirm the diagnosis. How is this treated? Treatment for this condition depends on how severe it is. Treatment should be started right away. Do not wait until dehydration becomes severe. Severe dehydration is an emergency and needs to be treated in a hospital.  Mild or moderate dehydration can be treated at home. You may be asked to: ? Drink more fluids. ? Drink an oral rehydration solution (ORS). This drink helps restore proper amounts of fluids and salts and minerals in the blood (electrolytes).  Severe dehydration can be treated: ? With IV fluids. ? By correcting abnormal levels of electrolytes. This is often done by giving electrolytes through a tube that is passed through your nose and into your stomach (nasogastric tube, or NG tube). ? By treating the underlying cause of dehydration. Follow these instructions at home: Oral rehydration solution If told by your health care provider, drink an ORS:  Make   an ORS by following instructions on the package.  Start by drinking small amounts, about  cup (120 mL) every 5-10 minutes.  Slowly increase how much you drink until you have taken the amount recommended by your health care provider. Eating and drinking         Drink enough clear fluid to keep your urine pale yellow. If you were told to drink an ORS, finish the ORS first and then start slowly  drinking other clear fluids. Drink fluids such as: ? Water. Do not drink only water. Doing that can lead to hyponatremia, which is having too little salt (sodium) in the body. ? Water from ice chips you suck on. ? Fruit juice that you have added water to (diluted fruit juice). ? Low-calorie sports drinks.  Eat foods that contain a healthy balance of electrolytes, such as bananas, oranges, potatoes, tomatoes, and spinach.  Do not drink alcohol.  Avoid the following: ? Drinks that contain a lot of sugar. These include high-calorie sports drinks, fruit juice that is not diluted, and soda. ? Caffeine. ? Foods that are greasy or contain a lot of fat or sugar. General instructions  Take over-the-counter and prescription medicines only as told by your health care provider.  Do not take sodium tablets. Doing that can lead to having too much sodium in the body (hypernatremia).  Return to your normal activities as told by your health care provider. Ask your health care provider what activities are safe for you.  Keep all follow-up visits as told by your health care provider. This is important. Contact a health care provider if:  You have muscle cramps, pain, or discomfort, such as: ? Pain in your abdomen and the pain gets worse or stays in one area (localizes). ? Stiff neck.  You have a rash.  You are more irritable than usual.  You are sleepier or have a harder time waking than usual.  You feel weak or dizzy.  You feel very thirsty. Get help right away if you have:  Any symptoms of severe dehydration.  Symptoms of vomiting, such as: ? You cannot eat or drink without vomiting. ? Vomiting gets worse or does not go away. ? Vomit includes blood or green matter (bile).  Symptoms that get worse with treatment.  A fever.  A severe headache.  Problems with urination or bowel movements, such as: ? Diarrhea that gets worse or does not go away. ? Blood in your stool (feces). This  may cause stool to look black and tarry. ? Not urinating, or urinating only a small amount of very dark urine, within 6-8 hours.  Trouble breathing. These symptoms may represent a serious problem that is an emergency. Do not wait to see if the symptoms will go away. Get medical help right away. Call your local emergency services (911 in the [place] Do not drive yourself to the hospital. Summary  Dehydration is a condition in which there is not enough water or other fluids in the body. This happens when a person loses more fluids than he or she takes in.  Treatment for this condition depends on how severe it is. Treatment should be started right away. Do not wait until dehydration becomes severe.  Drink enough clear fluid to keep your urine pale yellow. If you were told to drink an oral rehydration solution (ORS), finish the ORS first and then start slowly drinking other clear fluids.  Take over-the-counter and prescription medicines only as told by your health care   provider.  Get help right away if you have any symptoms of severe dehydration. This information is not intended to replace advice given to you by your health care provider. Make sure you discuss any questions you have with your health care provider. Document Revised: 07/21/2019 Document Reviewed: 07/21/2019 Elsevier Patient Education  2020 [employer]   

## 2020-02-15 NOTE — Progress Notes (Signed)
Hematology and Oncology Follow Up Visit  Vicki Moon [id number] 1951/04/06 69 y.o. 02/15/2020   Principle Diagnosis:  IgG Kappa myeloma -- 1p-, 13q-, 16q- and t(11:14)  Past Therapy: RVD -- start on 05/09/2019 -- d/c revlimid on 06/14/2019 -- d/c on 06/28/2019 S/P Kyphoplasty at L2 XRT to lumbar spine -- completed in [facility] -- startedon 07/12/2019- s/p cycle4 -- d/c on 10/19/2019 due to disease progression  Current Therapy:   Selinexor/Velcade -- start cycle #1 on 02/06/2020 Kyprolis/Faspro -- start on 10/26/2019 -- s/p cycle #4 -- d/c due to progression Xgeva 120 mg sq q 3 months -- next dose on03/2021   Interim History:  Vicki Moon is here today for follow-up.  Unfortunately, she is quite nauseated today.  We started her on Selinexor a week ago.  She has had 2 doses so far.  She said that nausea was very bad last week.  Its little bit better this week.  I just do not think she is ready to start the subcutaneous Velcade.  I think that we have to give her some fluid today and then see about Velcade starting next week.  Her white cell count is down quite a bit.  I suspect this probably is from the [place].  Her hemoglobin is up a little bit.  Her hemoglobin is 10.4.  This might be a little bit up if she is dehydrated.  She is not having diarrhea.  I need to make some medicine adjustments.  We probably need to have her stop the Lipitor, lisinopril, and potassium.  She is not on any type of antacid.  I will see about getting her on something oral.  I am going to try her on Sancuso patch.  Maybe, this will help a little bit with the nausea at home.  She has had no fever.  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  We will have to see what her myeloma levels look like.  I would not think that they would be improved yet since she is only had 2 doses of the Selinexor.    At this point, her performance status is ECOG 2. Medications:  Allergies  as of 02/15/2020      Reactions   Metformin Diarrhea, Nausea Only      Medication List       Accurate as of February 15, 2020 10:00 AM. If you have any questions, ask your nurse or doctor.        ALPRAZolam 0.5 MG tablet Commonly known as: XANAX Take 0.5 mg by mouth at bedtime as needed.   atorvastatin 10 MG tablet Commonly known as: LIPITOR Take 10 mg by mouth daily.   cyclobenzaprine 5 MG tablet Commonly known as: FLEXERIL Take 1 tablet (5 mg total) by mouth 3 (three) times daily as needed.   FreeStyle [employer] Use one sensor every 10 days.   glipiZIDE 10 MG 24 hr tablet Commonly known as: GLUCOTROL XL Take 10 mg by mouth daily.   HYDROcodone-acetaminophen 5-325 MG tablet Commonly known as: NORCO/VICODIN Take 1 tablet by mouth every 6 (six) hours as needed for moderate pain. Received from [facility] ED.   ibuprofen 800 MG tablet Commonly known as: ADVIL Take 800 mg by mouth every 8 (eight) hours as needed.   latanoprost 0.005 % ophthalmic solution Commonly known as: XALATAN Place 1 drop into both eyes at bedtime.   lisinopril 10 MG tablet Commonly known as: ZESTRIL Take 10 mg  by mouth daily.   NovoLOG 100 UNIT/ML injection Generic drug: insulin aspart Inject 1.2-25 Units into the skin as directed. PATIENT USES INSULIN PUMP  Patient has a set basal rate of 1.2-1.5u per hour with meal time boluses of up to 25 units   [place] 1 Device by Does not apply route 3 (three) times a week. Patient put on insulin pump every 2 to 3 days for insulin delivery   ondansetron 8 MG tablet Commonly known as: ZOFRAN Take 1 tablet (8 mg total) by mouth every 8 (eight) hours as needed for nausea or vomiting.   PARoxetine 30 MG tablet Commonly known as: PAXIL Take 30 mg by mouth every morning.   potassium chloride SA 20 MEQ tablet Commonly known as: KLOR-CON Take 2 tablets (40 mEq total) by mouth daily.   PRESCRIPTION MEDICATION Onmipod  dash-insulin pump.Changes every 2 days. 5-25units ac based on blood sugar.   prochlorperazine 10 MG tablet Commonly known as: COMPAZINE Take 1 tablet (10 mg total) by mouth every 6 (six) hours as needed (Nausea or vomiting).   Selinexor (100 MG Once Weekly) 20 MG Tbpk Take 100 mg by mouth once a week.   temazepam 15 MG capsule Commonly known as: RESTORIL Take 1 capsule (15 mg total) by mouth at bedtime as needed for sleep.       Allergies:  Allergies  Allergen Reactions  . Metformin Diarrhea and Nausea Only    Past Medical History, Surgical history, Social history, and Family History were reviewed and updated.  Review of Systems: Review of Systems  Constitutional: Negative.   HENT: Negative.   Eyes: Negative.   Respiratory: Negative.   Cardiovascular: Negative.   Gastrointestinal: Negative.   Genitourinary: Negative.   Musculoskeletal: Negative.   Skin: Negative.   Neurological: Negative.   Endo/Heme/Allergies: Negative.   Psychiatric/Behavioral: Negative.      Physical Exam:  weight is 167 lb 0.6 oz (75.8 kg). Her temporal temperature is 97.6 F (36.4 C). Her blood pressure is 121/54 (abnormal) and her pulse is 79. Her respiration is 20 and oxygen saturation is 100%.   Wt Readings from Last 3 Encounters:  02/15/20 167 lb 0.6 oz (75.8 kg)  01/18/20 182 lb 12.8 oz (82.9 kg)  12/29/19 182 lb (82.6 kg)    Physical Exam Vitals reviewed.  HENT:     Head: Normocephalic and atraumatic.  Eyes:     Pupils: Pupils are equal, round, and reactive to light.  Cardiovascular:     Rate and Rhythm: Normal rate and regular rhythm.     Heart sounds: Normal heart sounds.  Pulmonary:     Effort: Pulmonary effort is normal.     Breath sounds: Normal breath sounds.  Abdominal:     General: Bowel sounds are normal.     Palpations: Abdomen is soft.  Musculoskeletal:        General: No tenderness or deformity. Normal range of motion.     Cervical back: Normal range of  motion.  Lymphadenopathy:     Cervical: No cervical adenopathy.  Skin:    General: Skin is warm and dry.     Findings: No erythema or rash.  Neurological:     Mental Status: She is alert and oriented to person, place, and time.  Psychiatric:        Behavior: Behavior normal.        Thought Content: Thought content normal.        Judgment: Judgment normal.  Lab Results  Component Value Date   WBC 1.5 (L) 02/15/2020   HGB 10.4 (L) 02/15/2020   HCT 32.0 (L) 02/15/2020   MCV 99.4 02/15/2020   PLT 119 (L) 02/15/2020   Lab Results  Component Value Date   FERRITIN 754 (H) 12/28/2019   IRON 57 12/28/2019   TIBC 296 12/28/2019   UIBC 239 12/28/2019   IRONPCTSAT 19 (L) 12/28/2019   Lab Results  Component Value Date   RETICCTPCT 1.2 02/15/2020   RBC 3.22 (L) 02/15/2020   Lab Results  Component Value Date   KPAFRELGTCHN 212.3 (H) 01/18/2020   LAMBDASER 2.6 (L) 01/18/2020   KAPLAMBRATIO 81.65 (H) 01/18/2020   Lab Results  Component Value Date   IGGSERUM 1,937 (H) 01/18/2020   IGA 6 (L) 01/18/2020   IGMSERUM 11 (L) 01/18/2020   Lab Results  Component Value Date   TOTALPROTELP 6.7 01/18/2020   TOTALPROTELP 6.6 01/18/2020   ALBUMINELP 3.5 01/18/2020   ALBUMINELP 3.4 01/18/2020   A1GS 0.3 01/18/2020   A1GS 0.3 01/18/2020   A2GS 0.7 01/18/2020   A2GS 0.7 01/18/2020   BETS 0.9 01/18/2020   BETS 0.9 01/18/2020   GAMS 1.3 01/18/2020   GAMS 1.3 01/18/2020   MSPIKE 1.2 (H) 01/18/2020   MSPIKE 1.2 (H) 01/18/2020   SPEI Comment 01/18/2020     Chemistry      Component Value Date/Time   NA 133 (L) 02/15/2020 0904   K 3.3 (L) 02/15/2020 0904   CL 101 02/15/2020 0904   CO2 25 02/15/2020 0904   BUN 16 02/15/2020 0904   CREATININE 1.08 (H) 02/15/2020 0904   CREATININE 0.65 01/12/2018 1004      Component Value Date/Time   CALCIUM 8.5 (L) 02/15/2020 0904   ALKPHOS 78 02/15/2020 0904   AST 34 02/15/2020 0904   ALT 28 02/15/2020 0904   BILITOT 0.4 02/15/2020 0904         Impression and Plan: Vicki Moon is a very pleasant 69 yo caucasian female with IgG kappa myeloma, with what I would consider to have high risk cytogenetics.  Of note, she does have the t(11:14) translocation.  With this, we might be able to consider venetoclax.  Studies have shown that venetoclax is very active in myeloma patients with the t(11:14) translocation.  We will have to have her come back next week.  Again, I just want her quality life to be better.  This is always complicated when Vicki Moon comes in.  She has a lot going on.  It took [place] about 35 minutes today to try to sort out what we needed to do and to set up the fluid and make an adjustment to her protocol.    [name], MD 2/24/202110:00 AM

## 2020-02-16 LAB — KAPPA/LAMBDA LIGHT CHAINS
Kappa free light chain: 470.4 mg/L — ABNORMAL HIGH (ref 3.3–19.4)
Kappa, lambda light chain ratio: 204.52 — ABNORMAL HIGH (ref 0.26–1.65)
Lambda free light chains: 2.3 mg/L — ABNORMAL LOW (ref 5.7–26.3)

## 2020-02-16 LAB — IGG, IGA, IGM
IgA: 6 mg/dL — ABNORMAL LOW (ref 87–352)
IgG (Immunoglobin G), Serum: 2317 mg/dL — ABNORMAL HIGH (ref 586–1602)
IgM (Immunoglobulin M), Srm: 9 mg/dL — ABNORMAL LOW (ref 26–217)

## 2020-02-20 ENCOUNTER — Telehealth: Payer: Self-pay | Admitting: *Deleted

## 2020-02-20 LAB — PROTEIN ELECTROPHORESIS, SERUM, WITH REFLEX
A/G Ratio: 0.9 (ref 0.7–1.7)
Albumin ELP: 3.6 g/dL (ref 2.9–4.4)
Alpha-1-Globulin: 0.2 g/dL (ref 0.0–0.4)
Alpha-2-Globulin: 0.9 g/dL (ref 0.4–1.0)
Beta Globulin: 1 g/dL (ref 0.7–1.3)
Gamma Globulin: 1.9 g/dL — ABNORMAL HIGH (ref 0.4–1.8)
Globulin, Total: 4.1 g/dL — ABNORMAL HIGH (ref 2.2–3.9)
M-Spike, %: 1.8 g/dL — ABNORMAL HIGH
SPEP Interpretation: 0
Total Protein ELP: 7.7 g/dL (ref 6.0–8.5)

## 2020-02-20 LAB — IMMUNOFIXATION REFLEX, SERUM
IgA: 8 mg/dL — ABNORMAL LOW (ref 87–352)
IgG (Immunoglobin G), Serum: 2438 mg/dL — ABNORMAL HIGH (ref 586–1602)
IgM (Immunoglobulin M), Srm: 8 mg/dL — ABNORMAL LOW (ref 26–217)

## 2020-02-20 NOTE — Telephone Encounter (Signed)
Message received from patient questioning if she needs to come in for appt on 02/22/20.  Pt notified that Dr. [name] would like for patient to come in for scheduled appts on 02/22/20.  Pt appreciative of call and has no questions at this time.

## 2020-02-22 ENCOUNTER — Other Ambulatory Visit: Payer: Self-pay

## 2020-02-22 ENCOUNTER — Telehealth: Payer: Self-pay | Admitting: Hematology & Oncology

## 2020-02-22 ENCOUNTER — Inpatient Hospital Stay: Payer: Medicare Other

## 2020-02-22 ENCOUNTER — Inpatient Hospital Stay (HOSPITAL_BASED_OUTPATIENT_CLINIC_OR_DEPARTMENT_OTHER): Payer: Medicare Other | Admitting: Family

## 2020-02-22 ENCOUNTER — Inpatient Hospital Stay: Payer: Medicare Other | Attending: Hematology & Oncology

## 2020-02-22 ENCOUNTER — Other Ambulatory Visit: Payer: Self-pay | Admitting: *Deleted

## 2020-02-22 VITALS — BP 104/39 | HR 78 | Temp 98.3°F | Resp 20 | Ht 62.0 in | Wt 164.1 lb

## 2020-02-22 VITALS — BP 125/53 | HR 62

## 2020-02-22 DIAGNOSIS — Z79899 Other long term (current) drug therapy: Secondary | ICD-10-CM | POA: Insufficient documentation

## 2020-02-22 DIAGNOSIS — C9000 Multiple myeloma not having achieved remission: Secondary | ICD-10-CM | POA: Diagnosis not present

## 2020-02-22 DIAGNOSIS — E860 Dehydration: Secondary | ICD-10-CM | POA: Diagnosis not present

## 2020-02-22 DIAGNOSIS — E119 Type 2 diabetes mellitus without complications: Secondary | ICD-10-CM | POA: Diagnosis not present

## 2020-02-22 DIAGNOSIS — R5383 Other fatigue: Secondary | ICD-10-CM | POA: Diagnosis not present

## 2020-02-22 DIAGNOSIS — R634 Abnormal weight loss: Secondary | ICD-10-CM | POA: Insufficient documentation

## 2020-02-22 DIAGNOSIS — R110 Nausea: Secondary | ICD-10-CM

## 2020-02-22 DIAGNOSIS — R531 Weakness: Secondary | ICD-10-CM | POA: Insufficient documentation

## 2020-02-22 DIAGNOSIS — R519 Headache, unspecified: Secondary | ICD-10-CM | POA: Diagnosis not present

## 2020-02-22 DIAGNOSIS — Z794 Long term (current) use of insulin: Secondary | ICD-10-CM | POA: Diagnosis not present

## 2020-02-22 DIAGNOSIS — D631 Anemia in chronic kidney disease: Secondary | ICD-10-CM

## 2020-02-22 DIAGNOSIS — R112 Nausea with vomiting, unspecified: Secondary | ICD-10-CM

## 2020-02-22 LAB — CBC WITH DIFFERENTIAL (CANCER CENTER ONLY)
Abs Immature Granulocytes: 0.01 10*3/uL (ref 0.00–0.07)
Basophils Absolute: 0 10*3/uL (ref 0.0–0.1)
Basophils Relative: 0 %
Eosinophils Absolute: 0 10*3/uL (ref 0.0–0.5)
Eosinophils Relative: 0 %
HCT: 30.3 % — ABNORMAL LOW (ref 36.0–46.0)
Hemoglobin: 9.8 g/dL — ABNORMAL LOW (ref 12.0–15.0)
Immature Granulocytes: 1 %
Lymphocytes Relative: 29 %
Lymphs Abs: 0.4 10*3/uL — ABNORMAL LOW (ref 0.7–4.0)
MCH: 31.9 pg (ref 26.0–34.0)
MCHC: 32.3 g/dL (ref 30.0–36.0)
MCV: 98.7 fL (ref 80.0–100.0)
Monocytes Absolute: 0.2 10*3/uL (ref 0.1–1.0)
Monocytes Relative: 14 %
Neutro Abs: 0.7 10*3/uL — ABNORMAL LOW (ref 1.7–7.7)
Neutrophils Relative %: 56 %
Platelet Count: 98 10*3/uL — ABNORMAL LOW (ref 150–400)
RBC: 3.07 MIL/uL — ABNORMAL LOW (ref 3.87–5.11)
RDW: 16.4 % — ABNORMAL HIGH (ref 11.5–15.5)
WBC Count: 1.2 10*3/uL — ABNORMAL LOW (ref 4.0–10.5)
nRBC: 5 % — ABNORMAL HIGH (ref 0.0–0.2)

## 2020-02-22 LAB — CMP (CANCER CENTER ONLY)
ALT: 15 U/L (ref 0–44)
AST: 15 U/L (ref 15–41)
Albumin: 3.9 g/dL (ref 3.5–5.0)
Alkaline Phosphatase: 66 U/L (ref 38–126)
Anion gap: 8 (ref 5–15)
BUN: 11 mg/dL (ref 8–23)
CO2: 26 mmol/L (ref 22–32)
Calcium: 8.8 mg/dL — ABNORMAL LOW (ref 8.9–10.3)
Chloride: 101 mmol/L (ref 98–111)
Creatinine: 1.02 mg/dL — ABNORMAL HIGH (ref 0.44–1.00)
GFR, Est AFR Am: >60 mL/min (ref >60)
GFR, Estimated: 56 mL/min — ABNORMAL LOW (ref >60)
Glucose, Bld: 157 mg/dL — ABNORMAL HIGH (ref 70–99)
Potassium: 3.6 mmol/L (ref 3.5–5.1)
Sodium: 135 mmol/L (ref 135–145)
Total Bilirubin: 0.5 mg/dL (ref 0.3–1.2)
Total Protein: 7.7 g/dL (ref 6.5–8.1)

## 2020-02-22 MED ORDER — ONDANSETRON HCL 4 MG/2ML IJ SOLN
8.0000 mg | Freq: Once | INTRAMUSCULAR | Status: AC
  Administered 2020-02-22: 10:00:00 8 mg via INTRAVENOUS

## 2020-02-22 MED ORDER — ONDANSETRON HCL 4 MG/2ML IJ SOLN: 8.0000 mg | Freq: Once | INTRAMUSCULAR | Status: DC

## 2020-02-22 MED ORDER — SODIUM CHLORIDE 0.9 % IV SOLN: 8.0000 mg | Freq: Once | INTRAVENOUS | Status: DC

## 2020-02-22 MED ORDER — ONDANSETRON HCL 4 MG/2ML IJ SOLN
INTRAMUSCULAR | Status: AC
  Filled 2020-02-22: qty 4

## 2020-02-22 MED ORDER — ONDANSETRON HCL 4 MG/2ML IJ SOLN
8.0000 mg | Freq: Once | INTRAMUSCULAR | Status: AC
  Administered 2020-02-22: 8 mg via INTRAVENOUS

## 2020-02-22 MED ORDER — SODIUM CHLORIDE 0.9 % IV SOLN
1000.0000 mL | Freq: Once | INTRAVENOUS | Status: DC
  Administered 2020-02-22: 1000 mL via INTRAVENOUS
  Filled 2020-02-22: qty 1000

## 2020-02-22 NOTE — Progress Notes (Signed)
Hematology and Oncology Follow Up Visit  Vicki Moon [id number] [birth year] 69 y.o. 02/22/2020   Principle Diagnosis:  IgG Kappa myeloma -- 1p-, 13q-, 16q- and t(11:14)  Past Therapy: RVD -- start on 05/09/2019 -- d/c revlimid on 06/14/2019 -- d/c on 06/28/2019 S/P Kyphoplasty at L2 XRT to lumbar spine -- completed in [facility] -- startedon 07/12/2019- s/p cycle4 -- d/c on 10/19/2019 due to disease progression Kyprolis/Faspro -- start on 10/26/2019 -- s/p cycle #4 -- d/c due to progression  Current Therapy:   Selinexor/Velcade -- start cycle 1 on 02/16/2020 Xgeva 120 mg sq q 3 months -- next dose on03/2021   Interim History:  Vicki Moon is here today for follow-up and to start treatment with Velcade. Unfortunately she is just not tolerating the Selinexor. She states that after taking her pills on Monday as prescribed she then started vomiting every hour for 2 days. The Sancuso patch has not helped so far.  She feels fatigued, weak and dehydrated. She is in a wheelchair today. She has been using her cane at home when ambulating for support.  She has not appetite and has not been able to drink much fluid. Her weight is down 3 lbs.  Hgb is down at 9.8, WBC count 1.2 and platelets 98.  She has not noted any blood loss. No bruising or petechiae.  No falls or syncopal episodes to report.  She states that she will occasionally have chills.  No fever, cough, rash, dizziness, SOB, chest pain, palpitations, abdominal pain or changes in bowel or bladder habits.  No swelling, tenderness, numbness or tingling in her extremities.   ECOG Performance Status: 2 - Symptomatic, <50% confined to bed  Medications:  Allergies as of 02/22/2020      Reactions   Metformin Diarrhea, Nausea Only      Medication List       Accurate as of February 22, 2020  9:28 AM. If you have any questions, ask your nurse or doctor.        ALPRAZolam 0.5 MG tablet Commonly known as: XANAX Take 0.5 mg by  mouth at bedtime as needed.   atorvastatin 10 MG tablet Commonly known as: LIPITOR Take 10 mg by mouth daily.   cyclobenzaprine 5 MG tablet Commonly known as: FLEXERIL Take 1 tablet (5 mg total) by mouth 3 (three) times daily as needed.   FreeStyle [employer] Use one sensor every 10 days.   glipiZIDE 10 MG 24 hr tablet Commonly known as: GLUCOTROL XL Take 10 mg by mouth daily.   granisetron 3.1 MG/24HR Commonly known as: SANCUSO Apply to skin starting 24 hours before chemotherapy. Remove after 7 days.   HYDROcodone-acetaminophen 5-325 MG tablet Commonly known as: NORCO/VICODIN Take 1 tablet by mouth every 6 (six) hours as needed for moderate pain. Received from [facility] ED.   ibuprofen 800 MG tablet Commonly known as: ADVIL Take 800 mg by mouth every 8 (eight) hours as needed.   latanoprost 0.005 % ophthalmic solution Commonly known as: XALATAN Place 1 drop into both eyes at bedtime.   lisinopril 10 MG tablet Commonly known as: ZESTRIL Take 10 mg by mouth daily.   NovoLOG 100 UNIT/ML injection Generic drug: insulin aspart Inject 1.2-25 Units into the skin as directed. PATIENT USES INSULIN PUMP  Patient has a set basal rate of 1.2-1.5u per hour with meal time boluses of up to 25 units   OmniPod Dash System Kit 1 Device by Does not apply route 3 (  three) times a week. Patient put on insulin pump every 2 to 3 days for insulin delivery   ondansetron 8 MG tablet Commonly known as: ZOFRAN Take 1 tablet (8 mg total) by mouth every 8 (eight) hours as needed for nausea or vomiting.   pantoprazole 40 MG tablet Commonly known as: Protonix Take 1 tablet (40 mg total) by mouth daily.   PARoxetine 30 MG tablet Commonly known as: PAXIL Take 30 mg by mouth every morning.   potassium chloride SA 20 MEQ tablet Commonly known as: KLOR-CON Take 2 tablets (40 mEq total) by mouth daily.   PRESCRIPTION MEDICATION Onmipod dash-insulin pump.Changes every 2 days.  5-25units ac based on blood sugar.   prochlorperazine 10 MG tablet Commonly known as: COMPAZINE Take 1 tablet (10 mg total) by mouth every 6 (six) hours as needed (Nausea or vomiting).   selinexor 20 MG Tbpk Take 100 mg by mouth once a week.   temazepam 15 MG capsule Commonly known as: RESTORIL Take 1 capsule (15 mg total) by mouth at bedtime as needed for sleep.       Allergies:  Allergies  Allergen Reactions  . Metformin Diarrhea and Nausea Only    Past Medical History, Surgical history, Social history, and Family History were reviewed and updated.  Review of Systems: All other 10 point review of systems is negative.   Physical Exam:  vitals were not taken for this visit.   Wt Readings from Last 3 Encounters:  02/15/20 167 lb 0.6 oz (75.8 kg)  01/18/20 182 lb 12.8 oz (82.9 kg)  12/29/19 182 lb (82.6 kg)    Ocular: Sclerae unicteric, pupils equal, round and reactive to light Ear-nose-throat: Oropharynx clear, dentition fair Lymphatic: No cervical or supraclavicular adenopathy Lungs no rales or rhonchi, good excursion bilaterally Heart regular rate and rhythm, no murmur appreciated Abd soft, nontender, positive bowel sounds, no liver or spleen tip palpated on exam, no fluid wave  MSK no focal spinal tenderness, no joint edema Neuro: non-focal, well-oriented, appropriate affect Breasts: Deferred   Lab Results  Component Value Date   WBC 1.5 (L) 02/15/2020   HGB 10.4 (L) 02/15/2020   HCT 32.0 (L) 02/15/2020   MCV 99.4 02/15/2020   PLT 119 (L) 02/15/2020   Lab Results  Component Value Date   FERRITIN 1,606 (H) 02/15/2020   IRON 155 (H) 02/15/2020   TIBC 271 02/15/2020   UIBC 116 (L) 02/15/2020   IRONPCTSAT 57 02/15/2020   Lab Results  Component Value Date   RETICCTPCT 1.2 02/15/2020   RBC 3.22 (L) 02/15/2020   Lab Results  Component Value Date   KPAFRELGTCHN 470.4 (H) 02/15/2020   LAMBDASER 2.3 (L) 02/15/2020   KAPLAMBRATIO 204.52 (H) 02/15/2020    Lab Results  Component Value Date   IGGSERUM 2,317 (H) 02/15/2020   IGGSERUM 2,438 (H) 02/15/2020   IGA 6 (L) 02/15/2020   IGA 8 (L) 02/15/2020   IGMSERUM 9 (L) 02/15/2020   IGMSERUM 8 (L) 02/15/2020   Lab Results  Component Value Date   TOTALPROTELP 7.7 02/15/2020   ALBUMINELP 3.6 02/15/2020   A1GS 0.2 02/15/2020   A2GS 0.9 02/15/2020   BETS 1.0 02/15/2020   GAMS 1.9 (H) 02/15/2020   MSPIKE 1.8 (H) 02/15/2020   SPEI Comment 01/18/2020     Chemistry      Component Value Date/Time   NA 133 (L) 02/15/2020 0904   K 3.3 (L) 02/15/2020 0904   CL 101 02/15/2020 0904   CO2 25 02/15/2020 0904  BUN 16 02/15/2020 0904   CREATININE 1.08 (H) 02/15/2020 0904   CREATININE 0.65 01/12/2018 1004      Component Value Date/Time   CALCIUM 8.5 (L) 02/15/2020 0904   ALKPHOS 78 02/15/2020 0904   AST 34 02/15/2020 0904   ALT 28 02/15/2020 0904   BILITOT 0.4 02/15/2020 0904       Impression and Plan: Ms. Ferran is a very pleasant 69 yo caucasian female with IgG kappa myeloma, high risk cytogenetics, t(11:14) translocation.  I was able to speak with Dr. [name] about her continued n/v and intolerance of Selinexor.  She will stop the Selinexo and we will give only fluids and IV antiemetics today.  She will come back in another 3 weeks for MD follow-up to discuss changing to Venetoclax.  She is in agreement with this and will contact our office with any questions or concerns.   Laverna Peace, NP 3/3/20219:28 AM

## 2020-02-22 NOTE — Progress Notes (Signed)
zofran

## 2020-02-22 NOTE — Telephone Encounter (Signed)
Appointments scheduled calendar printed per 3/3 los 

## 2020-02-23 LAB — KAPPA/LAMBDA LIGHT CHAINS
Kappa free light chain: 478 mg/L — ABNORMAL HIGH (ref 3.3–19.4)
Kappa, lambda light chain ratio: 227.62 — ABNORMAL HIGH (ref 0.26–1.65)
Lambda free light chains: 2.1 mg/L — ABNORMAL LOW (ref 5.7–26.3)

## 2020-02-23 LAB — IGG, IGA, IGM
IgA: 5 mg/dL — ABNORMAL LOW (ref 87–352)
IgG (Immunoglobin G), Serum: 2127 mg/dL — ABNORMAL HIGH (ref 586–1602)
IgM (Immunoglobulin M), Srm: 8 mg/dL — ABNORMAL LOW (ref 26–217)

## 2020-02-27 LAB — IMMUNOFIXATION REFLEX, SERUM
IgA: 6 mg/dL — ABNORMAL LOW (ref 87–352)
IgG (Immunoglobin G), Serum: 2266 mg/dL — ABNORMAL HIGH (ref 586–1602)
IgM (Immunoglobulin M), Srm: 8 mg/dL — ABNORMAL LOW (ref 26–217)

## 2020-02-27 LAB — PROTEIN ELECTROPHORESIS, SERUM, WITH REFLEX
A/G Ratio: 1 (ref 0.7–1.7)
Albumin ELP: 3.6 g/dL (ref 2.9–4.4)
Alpha-1-Globulin: 0.2 g/dL (ref 0.0–0.4)
Alpha-2-Globulin: 0.9 g/dL (ref 0.4–1.0)
Beta Globulin: 0.8 g/dL (ref 0.7–1.3)
Gamma Globulin: 1.8 g/dL (ref 0.4–1.8)
Globulin, Total: 3.7 g/dL (ref 2.2–3.9)
M-Spike, %: 1.6 g/dL — ABNORMAL HIGH
SPEP Interpretation: 0
Total Protein ELP: 7.3 g/dL (ref 6.0–8.5)

## 2020-02-28 ENCOUNTER — Other Ambulatory Visit: Payer: Self-pay | Admitting: *Deleted

## 2020-02-28 DIAGNOSIS — C9000 Multiple myeloma not having achieved remission: Secondary | ICD-10-CM

## 2020-02-29 ENCOUNTER — Inpatient Hospital Stay: Payer: Medicare Other

## 2020-03-05 ENCOUNTER — Telehealth: Payer: Self-pay | Admitting: *Deleted

## 2020-03-05 ENCOUNTER — Other Ambulatory Visit (HOSPITAL_COMMUNITY): Payer: Self-pay | Admitting: *Deleted

## 2020-03-05 ENCOUNTER — Inpatient Hospital Stay (HOSPITAL_COMMUNITY): Payer: Medicare Other | Attending: Hematology

## 2020-03-05 ENCOUNTER — Other Ambulatory Visit: Payer: Self-pay

## 2020-03-05 VITALS — BP 110/64 | HR 76 | Temp 98.1°F | Resp 18

## 2020-03-05 DIAGNOSIS — C9000 Multiple myeloma not having achieved remission: Secondary | ICD-10-CM | POA: Diagnosis not present

## 2020-03-05 DIAGNOSIS — R197 Diarrhea, unspecified: Secondary | ICD-10-CM

## 2020-03-05 DIAGNOSIS — Z79899 Other long term (current) drug therapy: Secondary | ICD-10-CM | POA: Diagnosis not present

## 2020-03-05 DIAGNOSIS — E860 Dehydration: Secondary | ICD-10-CM

## 2020-03-05 DIAGNOSIS — R112 Nausea with vomiting, unspecified: Secondary | ICD-10-CM

## 2020-03-05 DIAGNOSIS — R110 Nausea: Secondary | ICD-10-CM

## 2020-03-05 LAB — CBC WITH DIFFERENTIAL/PLATELET
Abs Immature Granulocytes: 0.02 10*3/uL (ref 0.00–0.07)
Basophils Absolute: 0 10*3/uL (ref 0.0–0.1)
Basophils Relative: 0 %
Eosinophils Absolute: 0 10*3/uL (ref 0.0–0.5)
Eosinophils Relative: 2 %
HCT: 29.8 % — ABNORMAL LOW (ref 36.0–46.0)
Hemoglobin: 9.6 g/dL — ABNORMAL LOW (ref 12.0–15.0)
Immature Granulocytes: 1 %
Lymphocytes Relative: 18 %
Lymphs Abs: 0.3 10*3/uL — ABNORMAL LOW (ref 0.7–4.0)
MCH: 32.9 pg (ref 26.0–34.0)
MCHC: 32.2 g/dL (ref 30.0–36.0)
MCV: 102.1 fL — ABNORMAL HIGH (ref 80.0–100.0)
Monocytes Absolute: 0.2 10*3/uL (ref 0.1–1.0)
Monocytes Relative: 13 %
Neutro Abs: 1.2 10*3/uL — ABNORMAL LOW (ref 1.7–7.7)
Neutrophils Relative %: 66 %
Platelets: 72 10*3/uL — ABNORMAL LOW (ref 150–400)
RBC: 2.92 MIL/uL — ABNORMAL LOW (ref 3.87–5.11)
RDW: 16.9 % — ABNORMAL HIGH (ref 11.5–15.5)
WBC: 1.8 10*3/uL — ABNORMAL LOW (ref 4.0–10.5)
nRBC: 0 % (ref 0.0–0.2)

## 2020-03-05 LAB — COMPREHENSIVE METABOLIC PANEL
ALT: 17 U/L (ref 0–44)
AST: 24 U/L (ref 15–41)
Albumin: 3.1 g/dL — ABNORMAL LOW (ref 3.5–5.0)
Alkaline Phosphatase: 73 U/L (ref 38–126)
Anion gap: 9 (ref 5–15)
BUN: 10 mg/dL (ref 8–23)
CO2: 22 mmol/L (ref 22–32)
Calcium: 8.5 mg/dL — ABNORMAL LOW (ref 8.9–10.3)
Chloride: 107 mmol/L (ref 98–111)
Creatinine, Ser: 0.98 mg/dL (ref 0.44–1.00)
GFR calc Af Amer: >60 mL/min (ref >60)
GFR calc non Af Amer: 58 mL/min — ABNORMAL LOW (ref >60)
Glucose, Bld: 162 mg/dL — ABNORMAL HIGH (ref 70–99)
Potassium: 3.4 mmol/L — ABNORMAL LOW (ref 3.5–5.1)
Sodium: 138 mmol/L (ref 135–145)
Total Bilirubin: 0.7 mg/dL (ref 0.3–1.2)
Total Protein: 7.6 g/dL (ref 6.5–8.1)

## 2020-03-05 MED ORDER — ONDANSETRON HCL 4 MG/2ML IJ SOLN
8.0000 mg | Freq: Once | INTRAMUSCULAR | Status: AC
  Administered 2020-03-05: 8 mg via INTRAVENOUS
  Filled 2020-03-05: qty 4

## 2020-03-05 MED ORDER — SODIUM CHLORIDE 0.9 % IV SOLN: Freq: Once | INTRAVENOUS | Status: AC

## 2020-03-05 NOTE — Progress Notes (Signed)
Patient to room for hydration and Zofran IV for nausea, vomiting, and diarrhea.  Patient stated she alternates between diarrhea and nausea/vomiting with meals.  Reviewed medication list for nausea medications and the patient stated she uses the medications with some relief.  Patient also stated she has not used anything for the prn diarrhea.   No s/s of distress noted.    Patient tolerated hydration with no complaints voiced.  Sips of cola during infusion.  Peripheral IV site clean and dry with no bruising or swelling noted.  Band aid applied.  VSS with discharge and left by wheelchair with no s/s of distress noted.

## 2020-03-05 NOTE — Telephone Encounter (Signed)
Received a call from patient stating that she has been unable to eat solid foods x 3 weeks without throwing up.  Feels like she could use IV Fluids.  Patient lives in [place] and requested [facility] as place to get fluids.  [facility] accepted her today for IV fluids, labs and IV meds.  Patient has appt at 1030.

## 2020-03-07 ENCOUNTER — Other Ambulatory Visit: Payer: Medicare Other

## 2020-03-07 ENCOUNTER — Inpatient Hospital Stay: Payer: Medicare Other

## 2020-03-08 ENCOUNTER — Encounter: Payer: Self-pay | Admitting: Pharmacist

## 2020-03-08 NOTE — Progress Notes (Unsigned)
Patient will not be receiving any further Velcade.  Velcade careplan discontinued per Dr. Antonieta Pert instructions.

## 2020-03-12 ENCOUNTER — Telehealth: Payer: Self-pay | Admitting: *Deleted

## 2020-03-12 ENCOUNTER — Other Ambulatory Visit: Payer: Self-pay | Admitting: *Deleted

## 2020-03-12 ENCOUNTER — Telehealth: Payer: Self-pay | Admitting: Hematology & Oncology

## 2020-03-12 MED ORDER — DIPHENOXYLATE-ATROPINE 2.5-0.025 MG PO TABS: 1.0000 | ORAL_TABLET | Freq: Four times a day (QID) | ORAL | 0 refills | Status: DC | PRN

## 2020-03-12 NOTE — Telephone Encounter (Addendum)
Call received from patient stating that she is "not any better,"  that vomiting and diarrhea continues despite taking Imodium and that she is not able to eat.  Pt states that when she takes Zofran or Compazine, it does not help with nausea. Dr. [name] notified.  Order received for patient to come in tomorrow for labs, Dr. [name] and possible fluids and for pt to start Lomotil for loose stools per Dr. [name].  Pt notified of MD orders and is appreciative of assistance.  Message sent to scheduling.

## 2020-03-12 NOTE — Telephone Encounter (Signed)
Called and advised patient that her appointments have been rescheduled for 3/23 per 3/22 secure chat.  She was ok with new date/time

## 2020-03-13 ENCOUNTER — Other Ambulatory Visit: Payer: Self-pay

## 2020-03-13 ENCOUNTER — Inpatient Hospital Stay (HOSPITAL_COMMUNITY): Payer: Medicare Other

## 2020-03-13 ENCOUNTER — Inpatient Hospital Stay (HOSPITAL_COMMUNITY)
Admission: AD | Admit: 2020-03-13 | Discharge: 2020-03-23 | DRG: 841 | Disposition: A | Discharge status: Home / Self Care | Payer: Medicare Other | Source: Ambulatory Visit | Attending: Hematology & Oncology | Admitting: Hematology & Oncology

## 2020-03-13 ENCOUNTER — Inpatient Hospital Stay: Payer: Medicare Other

## 2020-03-13 ENCOUNTER — Inpatient Hospital Stay (HOSPITAL_BASED_OUTPATIENT_CLINIC_OR_DEPARTMENT_OTHER): Payer: Medicare Other | Admitting: Hematology & Oncology

## 2020-03-13 ENCOUNTER — Inpatient Hospital Stay: Payer: Self-pay

## 2020-03-13 ENCOUNTER — Telehealth: Payer: Self-pay | Admitting: *Deleted

## 2020-03-13 ENCOUNTER — Encounter: Payer: Self-pay | Admitting: Hematology & Oncology

## 2020-03-13 ENCOUNTER — Encounter (HOSPITAL_COMMUNITY): Payer: Self-pay | Admitting: Hematology & Oncology

## 2020-03-13 VITALS — BP 126/54 | HR 95 | Temp 97.1°F | Resp 20 | Wt 158.1 lb

## 2020-03-13 DIAGNOSIS — K5790 Diverticulosis of intestine, part unspecified, without perforation or abscess without bleeding: Secondary | ICD-10-CM | POA: Diagnosis present

## 2020-03-13 DIAGNOSIS — E876 Hypokalemia: Secondary | ICD-10-CM | POA: Diagnosis present

## 2020-03-13 DIAGNOSIS — C9000 Multiple myeloma not having achieved remission: Secondary | ICD-10-CM

## 2020-03-13 DIAGNOSIS — E119 Type 2 diabetes mellitus without complications: Secondary | ICD-10-CM | POA: Diagnosis not present

## 2020-03-13 DIAGNOSIS — E860 Dehydration: Secondary | ICD-10-CM

## 2020-03-13 DIAGNOSIS — H409 Unspecified glaucoma: Secondary | ICD-10-CM | POA: Diagnosis present

## 2020-03-13 DIAGNOSIS — R630 Anorexia: Secondary | ICD-10-CM

## 2020-03-13 DIAGNOSIS — R634 Abnormal weight loss: Secondary | ICD-10-CM | POA: Diagnosis present

## 2020-03-13 DIAGNOSIS — Z8249 Family history of ischemic heart disease and other diseases of the circulatory system: Secondary | ICD-10-CM | POA: Diagnosis not present

## 2020-03-13 DIAGNOSIS — Z6828 Body mass index (BMI) 28.0-28.9, adult: Secondary | ICD-10-CM | POA: Diagnosis not present

## 2020-03-13 DIAGNOSIS — D61818 Other pancytopenia: Secondary | ICD-10-CM | POA: Diagnosis present

## 2020-03-13 DIAGNOSIS — F17210 Nicotine dependence, cigarettes, uncomplicated: Secondary | ICD-10-CM | POA: Diagnosis present

## 2020-03-13 DIAGNOSIS — Z79899 Other long term (current) drug therapy: Secondary | ICD-10-CM

## 2020-03-13 DIAGNOSIS — R531 Weakness: Secondary | ICD-10-CM

## 2020-03-13 DIAGNOSIS — Z808 Family history of malignant neoplasm of other organs or systems: Secondary | ICD-10-CM

## 2020-03-13 DIAGNOSIS — E66812 Obesity, class 2: Secondary | ICD-10-CM

## 2020-03-13 DIAGNOSIS — R112 Nausea with vomiting, unspecified: Secondary | ICD-10-CM | POA: Diagnosis present

## 2020-03-13 DIAGNOSIS — Z9221 Personal history of antineoplastic chemotherapy: Secondary | ICD-10-CM

## 2020-03-13 DIAGNOSIS — R197 Diarrhea, unspecified: Secondary | ICD-10-CM | POA: Diagnosis present

## 2020-03-13 DIAGNOSIS — Z20822 Contact with and (suspected) exposure to covid-19: Secondary | ICD-10-CM | POA: Diagnosis present

## 2020-03-13 DIAGNOSIS — D696 Thrombocytopenia, unspecified: Secondary | ICD-10-CM | POA: Diagnosis not present

## 2020-03-13 DIAGNOSIS — Z823 Family history of stroke: Secondary | ICD-10-CM

## 2020-03-13 DIAGNOSIS — Z794 Long term (current) use of insulin: Secondary | ICD-10-CM | POA: Diagnosis not present

## 2020-03-13 DIAGNOSIS — Z833 Family history of diabetes mellitus: Secondary | ICD-10-CM | POA: Diagnosis not present

## 2020-03-13 DIAGNOSIS — E1122 Type 2 diabetes mellitus with diabetic chronic kidney disease: Secondary | ICD-10-CM

## 2020-03-13 DIAGNOSIS — D631 Anemia in chronic kidney disease: Secondary | ICD-10-CM

## 2020-03-13 DIAGNOSIS — E1165 Type 2 diabetes mellitus with hyperglycemia: Secondary | ICD-10-CM

## 2020-03-13 DIAGNOSIS — IMO0002 Reserved for concepts with insufficient information to code with codable children: Secondary | ICD-10-CM

## 2020-03-13 DIAGNOSIS — E1101 Type 2 diabetes mellitus with hyperosmolarity with coma: Secondary | ICD-10-CM

## 2020-03-13 DIAGNOSIS — D500 Iron deficiency anemia secondary to blood loss (chronic): Secondary | ICD-10-CM

## 2020-03-13 DIAGNOSIS — E1100 Type 2 diabetes mellitus with hyperosmolarity without nonketotic hyperglycemic-hyperosmolar coma (NKHHC): Secondary | ICD-10-CM

## 2020-03-13 DIAGNOSIS — Z6837 Body mass index (BMI) 37.0-37.9, adult: Secondary | ICD-10-CM

## 2020-03-13 DIAGNOSIS — K591 Functional diarrhea: Secondary | ICD-10-CM

## 2020-03-13 LAB — CBC WITH DIFFERENTIAL (CANCER CENTER ONLY)
Abs Immature Granulocytes: 0.1 10*3/uL — ABNORMAL HIGH (ref 0.00–0.07)
Basophils Absolute: 0 10*3/uL (ref 0.0–0.1)
Basophils Relative: 0 %
Eosinophils Absolute: 0.1 10*3/uL (ref 0.0–0.5)
Eosinophils Relative: 4 %
HCT: 26 % — ABNORMAL LOW (ref 36.0–46.0)
Hemoglobin: 8.7 g/dL — ABNORMAL LOW (ref 12.0–15.0)
Immature Granulocytes: 4 %
Lymphocytes Relative: 19 %
Lymphs Abs: 0.4 10*3/uL — ABNORMAL LOW (ref 0.7–4.0)
MCH: 33.3 pg (ref 26.0–34.0)
MCHC: 33.5 g/dL (ref 30.0–36.0)
MCV: 99.6 fL (ref 80.0–100.0)
Monocytes Absolute: 0.2 10*3/uL (ref 0.1–1.0)
Monocytes Relative: 8 %
Neutro Abs: 1.5 10*3/uL — ABNORMAL LOW (ref 1.7–7.7)
Neutrophils Relative %: 65 %
Platelet Count: 70 10*3/uL — ABNORMAL LOW (ref 150–400)
RBC: 2.61 MIL/uL — ABNORMAL LOW (ref 3.87–5.11)
RDW: 16.4 % — ABNORMAL HIGH (ref 11.5–15.5)
WBC Count: 2.4 10*3/uL — ABNORMAL LOW (ref 4.0–10.5)
nRBC: 0 % (ref 0.0–0.2)

## 2020-03-13 LAB — URINALYSIS, ROUTINE W REFLEX MICROSCOPIC
Bilirubin Urine: NEGATIVE
Glucose, UA: NEGATIVE mg/dL
Hgb urine dipstick: NEGATIVE
Ketones, ur: 5 mg/dL — AB
Leukocytes,Ua: NEGATIVE
Nitrite: NEGATIVE
Protein, ur: 100 mg/dL — AB
Specific Gravity, Urine: 1.027 (ref 1.005–1.030)
pH: 6 (ref 5.0–8.0)

## 2020-03-13 LAB — FERRITIN: Ferritin: 1339 ng/mL — ABNORMAL HIGH (ref 11–307)

## 2020-03-13 LAB — C DIFFICILE QUICK SCREEN W PCR REFLEX
C Diff antigen: NEGATIVE
C Diff interpretation: NOT DETECTED
C Diff toxin: NEGATIVE

## 2020-03-13 LAB — LACTATE DEHYDROGENASE: LDH: 349 U/L — ABNORMAL HIGH (ref 98–192)

## 2020-03-13 LAB — MRSA PCR SCREENING: MRSA by PCR: POSITIVE — AB

## 2020-03-13 LAB — CMP (CANCER CENTER ONLY)
ALT: 10 U/L (ref 0–44)
AST: 15 U/L (ref 15–41)
Albumin: 3.4 g/dL — ABNORMAL LOW (ref 3.5–5.0)
Alkaline Phosphatase: 82 U/L (ref 38–126)
Anion gap: 9 (ref 5–15)
BUN: 10 mg/dL (ref 8–23)
CO2: 25 mmol/L (ref 22–32)
Calcium: 8.9 mg/dL (ref 8.9–10.3)
Chloride: 104 mmol/L (ref 98–111)
Creatinine: 0.84 mg/dL (ref 0.44–1.00)
GFR, Est AFR Am: >60 mL/min (ref >60)
GFR, Estimated: >60 mL/min (ref >60)
Glucose, Bld: 228 mg/dL — ABNORMAL HIGH (ref 70–99)
Potassium: 3.2 mmol/L — ABNORMAL LOW (ref 3.5–5.1)
Sodium: 138 mmol/L (ref 135–145)
Total Bilirubin: 0.6 mg/dL (ref 0.3–1.2)
Total Protein: 7.6 g/dL (ref 6.5–8.1)

## 2020-03-13 LAB — GLUCOSE, CAPILLARY
Glucose-Capillary: 127 mg/dL — ABNORMAL HIGH (ref 70–99)
Glucose-Capillary: 121 mg/dL — ABNORMAL HIGH (ref 70–99)
Glucose-Capillary: 218 mg/dL — ABNORMAL HIGH (ref 70–99)

## 2020-03-13 LAB — HEMOGLOBIN A1C
Hgb A1c MFr Bld: 7.9 % — ABNORMAL HIGH (ref 4.8–5.6)
Mean Plasma Glucose: 180.03 mg/dL

## 2020-03-13 LAB — CBC
HCT: 25.4 % — ABNORMAL LOW (ref 36.0–46.0)
Hemoglobin: 8.2 g/dL — ABNORMAL LOW (ref 12.0–15.0)
MCH: 32.9 pg (ref 26.0–34.0)
MCHC: 32.3 g/dL (ref 30.0–36.0)
MCV: 102 fL — ABNORMAL HIGH (ref 80.0–100.0)
Platelets: 68 10*3/uL — ABNORMAL LOW (ref 150–400)
RBC: 2.49 MIL/uL — ABNORMAL LOW (ref 3.87–5.11)
RDW: 16.5 % — ABNORMAL HIGH (ref 11.5–15.5)
WBC: 2.6 10*3/uL — ABNORMAL LOW (ref 4.0–10.5)
nRBC: 0.8 % — ABNORMAL HIGH (ref 0.0–0.2)

## 2020-03-13 LAB — IRON AND TIBC
Iron: 101 ug/dL (ref 41–142)
Saturation Ratios: 52 % (ref 21–57)
TIBC: 194 ug/dL — ABNORMAL LOW (ref 236–444)
UIBC: 92 ug/dL — ABNORMAL LOW (ref 120–384)

## 2020-03-13 LAB — CREATININE, SERUM
Creatinine, Ser: 0.79 mg/dL (ref 0.44–1.00)
GFR calc Af Amer: >60 mL/min (ref >60)
GFR calc non Af Amer: >60 mL/min (ref >60)

## 2020-03-13 MED ORDER — BOOST / RESOURCE BREEZE PO LIQD CUSTOM
1.0000 | Freq: Three times a day (TID) | ORAL | Status: DC
  Administered 2020-03-14 – 2020-03-19 (×5): 1 via ORAL

## 2020-03-13 MED ORDER — HYDROCODONE-ACETAMINOPHEN 5-325 MG PO TABS: 1.0000 | ORAL_TABLET | Freq: Four times a day (QID) | ORAL | Status: DC | PRN

## 2020-03-13 MED ORDER — INSULIN ASPART 100 UNIT/ML ~~LOC~~ SOLN
0.0000 [IU] | SUBCUTANEOUS | Status: DC
  Administered 2020-03-13: 3 [IU] via SUBCUTANEOUS
  Administered 2020-03-13 – 2020-03-14 (×5): 1 [IU] via SUBCUTANEOUS
  Administered 2020-03-14: 2 [IU] via SUBCUTANEOUS
  Administered 2020-03-14 – 2020-03-15 (×2): 1 [IU] via SUBCUTANEOUS
  Administered 2020-03-15 – 2020-03-17 (×5): 2 [IU] via SUBCUTANEOUS
  Administered 2020-03-20 – 2020-03-21 (×3): 1 [IU] via SUBCUTANEOUS
  Administered 2020-03-22: 2 [IU] via SUBCUTANEOUS
  Administered 2020-03-22 – 2020-03-23 (×4): 1 [IU] via SUBCUTANEOUS

## 2020-03-13 MED ORDER — DIPHENOXYLATE-ATROPINE 2.5-0.025 MG PO TABS: 1.0000 | ORAL_TABLET | Freq: Four times a day (QID) | ORAL | Status: DC | PRN

## 2020-03-13 MED ORDER — PROCHLORPERAZINE MALEATE 10 MG PO TABS: 10.0000 mg | ORAL_TABLET | Freq: Four times a day (QID) | ORAL | Status: DC | PRN

## 2020-03-13 MED ORDER — ALPRAZOLAM 0.5 MG PO TABS: 0.5000 mg | ORAL_TABLET | Freq: Every evening | ORAL | Status: DC | PRN

## 2020-03-13 MED ORDER — PAROXETINE HCL 20 MG PO TABS
30.0000 mg | ORAL_TABLET | Freq: Every morning | ORAL | Status: DC
  Administered 2020-03-13 – 2020-03-23 (×11): 30 mg via ORAL
  Filled 2020-03-13 (×11): qty 1

## 2020-03-13 MED ORDER — MORPHINE SULFATE 2 MG/ML IJ SOLN: 2.0000 mg | INTRAMUSCULAR | Status: DC | PRN

## 2020-03-13 MED ORDER — SODIUM CHLORIDE 0.9 % IV SOLN
8.0000 mg | Freq: Three times a day (TID) | INTRAVENOUS | Status: DC
  Administered 2020-03-13 – 2020-03-18 (×14): 8 mg via INTRAVENOUS
  Filled 2020-03-13 (×16): qty 4

## 2020-03-13 MED ORDER — INSULIN GLARGINE 100 UNIT/ML ~~LOC~~ SOLN
24.0000 [IU] | Freq: Every day | SUBCUTANEOUS | Status: DC
  Administered 2020-03-13 – 2020-03-20 (×8): 24 [IU] via SUBCUTANEOUS
  Filled 2020-03-13 (×11): qty 0.24

## 2020-03-13 MED ORDER — POTASSIUM CHLORIDE IN NACL 20-0.9 MEQ/L-% IV SOLN
INTRAVENOUS | Status: DC
  Filled 2020-03-13 (×10): qty 1000

## 2020-03-13 MED ORDER — SODIUM CHLORIDE 0.9% FLUSH: 10.0000 mL | INTRAVENOUS | Status: DC | PRN

## 2020-03-13 MED ORDER — LOPERAMIDE HCL 2 MG PO CAPS: 2.0000 mg | ORAL_CAPSULE | ORAL | Status: DC | PRN

## 2020-03-13 MED ORDER — SODIUM CHLORIDE 0.9% FLUSH
10.0000 mL | Freq: Two times a day (BID) | INTRAVENOUS | Status: DC
  Administered 2020-03-13 – 2020-03-16 (×7): 10 mL
  Administered 2020-03-17: 20 mL
  Administered 2020-03-17 – 2020-03-21 (×8): 10 mL

## 2020-03-13 MED ORDER — IOHEXOL 300 MG/ML  SOLN
75.0000 mL | Freq: Once | INTRAMUSCULAR | Status: AC | PRN
  Administered 2020-03-13: 75 mL via INTRAVENOUS

## 2020-03-13 MED ORDER — ENOXAPARIN SODIUM 40 MG/0.4ML ~~LOC~~ SOLN
40.0000 mg | SUBCUTANEOUS | Status: DC
  Administered 2020-03-14 – 2020-03-22 (×8): 40 mg via SUBCUTANEOUS
  Filled 2020-03-13 (×10): qty 0.4

## 2020-03-13 MED ORDER — MORPHINE SULFATE (PF) 2 MG/ML IV SOLN: 2.0000 mg | INTRAVENOUS | Status: DC | PRN

## 2020-03-13 MED ORDER — ONDANSETRON HCL 8 MG PO TABS: 8.0000 mg | ORAL_TABLET | Freq: Three times a day (TID) | ORAL | Status: DC | PRN

## 2020-03-13 MED ORDER — CHLORHEXIDINE GLUCONATE CLOTH 2 % EX PADS
6.0000 | MEDICATED_PAD | Freq: Every day | CUTANEOUS | Status: DC
  Administered 2020-03-13 – 2020-03-22 (×8): 6 via TOPICAL

## 2020-03-13 MED ORDER — SODIUM CHLORIDE (PF) 0.9 % IJ SOLN
INTRAMUSCULAR | Status: AC
  Filled 2020-03-13: qty 50

## 2020-03-13 MED ORDER — OMNIPOD DASH PDM (GEN 4) KIT: 1.0000 | PACK | Status: DC

## 2020-03-13 MED ORDER — LATANOPROST 0.005 % OP SOLN
1.0000 [drp] | Freq: Every day | OPHTHALMIC | Status: DC
  Administered 2020-03-13 – 2020-03-22 (×10): 1 [drp] via OPHTHALMIC
  Filled 2020-03-13: qty 2.5

## 2020-03-13 NOTE — Progress Notes (Signed)
Hematology and Oncology Follow Up Visit  Vicki Moon [id number] 1951/01/30 69 y.o. 03/13/2020   Principle Diagnosis:  IgG Kappa myeloma -- 1p-, 13q-, 16q- and t(11:14)  Past Therapy: RVD -- start on 05/09/2019 -- d/c revlimid on 06/14/2019 -- d/c on 06/28/2019 S/P Kyphoplasty at L2 XRT to lumbar spine -- completed in [facility] -- startedon 07/12/2019- s/p cycle4 -- d/c on 10/19/2019 due to disease progression  Current Therapy:   Selinexor/Velcade -- start cycle #1 on 02/06/2020 - d/c on 03/03 Kyprolis/Faspro -- start on 10/26/2019 -- s/p cycle #4 -- d/c due to progression Xgeva 120 mg sq q 3 months -- next dose on03/2021   Interim History:  Vicki Moon is here today for follow-up.  She really is doing poorly.  I am not sure exactly what the reason is for her to be doing so poorly.  She is not able to eat she says.  Whenever she tries to eat, she throws up.  She has been off the Selinexor now for 3 weeks.  I would have to believe this is out of her system.  I know she has a diabetes.  I will know she may have diabetic gastroparesis.  She is dehydrated.  She is weak.  I just do not think that she is going to be able to go home.  Regard to have to admit her to try to figure out what is going on.  She is lost weight.  Again she says she cannot eat anything solid.  She was having diarrhea.  I just have a hard time believing any of this is from myeloma.  Her myeloma really is not all that bad.  Her M spike is only 1.6 g/dL.  Her IgG level is 2200 mg/dL.  Her kappa light chain is 48 mg/dL.  She has had no rashes.  She has had no recurrences of her MRSA abscesses.  She has had no fever.  There is been no obvious bleeding.  She has a little bit of a headache.  There is no visual changes.    At this point, her performance status is ECOG 2.   Medications:  Allergies as of 03/13/2020      Reactions   Metformin Diarrhea, Nausea Only      Medication List       Accurate as of March 13, 2020 11:20 AM. If you have any questions, ask your nurse or doctor.        STOP taking these medications   selinexor 20 MG Tbpk Stopped by: [name], MD     TAKE these medications   ALPRAZolam 0.5 MG tablet Commonly known as: XANAX Take 0.5 mg by mouth at bedtime as needed.   atorvastatin 10 MG tablet Commonly known as: LIPITOR Take 10 mg by mouth daily.   cyclobenzaprine 5 MG tablet Commonly known as: FLEXERIL Take 1 tablet (5 mg total) by mouth 3 (three) times daily as needed.   diphenoxylate-atropine 2.5-0.025 MG tablet Commonly known as: LOMOTIL Take 1 tablet by mouth 4 (four) times daily as needed for diarrhea or loose stools.   FreeStyle [employer] Use one sensor every 10 days.   glipiZIDE 10 MG 24 hr tablet Commonly known as: GLUCOTROL XL Take 10 mg by mouth daily.   granisetron 3.1 MG/24HR Commonly known as: SANCUSO Apply to skin starting 24 hours before chemotherapy. Remove after 7 days.   HYDROcodone-acetaminophen 5-325 MG tablet Commonly known as: NORCO/VICODIN Take 1 tablet by mouth  every 6 (six) hours as needed for moderate pain. Received from [facility] ED.   ibuprofen 800 MG tablet Commonly known as: ADVIL Take 800 mg by mouth every 8 (eight) hours as needed.   latanoprost 0.005 % ophthalmic solution Commonly known as: XALATAN Place 1 drop into both eyes at bedtime.   lisinopril 10 MG tablet Commonly known as: ZESTRIL Take 10 mg by mouth daily.   NovoLOG 100 UNIT/ML injection Generic drug: insulin aspart Inject 1.2-25 Units into the skin as directed. PATIENT USES INSULIN PUMP  Patient has a set basal rate of 1.2-1.5u per hour with meal time boluses of up to 25 units   [place] 1 Device by Does not apply route 3 (three) times a week. Patient put on insulin pump every 2 to 3 days for insulin delivery   ondansetron 8 MG tablet Commonly known as: ZOFRAN Take 1 tablet (8 mg total) by  mouth every 8 (eight) hours as needed for nausea or vomiting.   pantoprazole 40 MG tablet Commonly known as: Protonix Take 1 tablet (40 mg total) by mouth daily.   PARoxetine 30 MG tablet Commonly known as: PAXIL Take 30 mg by mouth every morning.   potassium chloride SA 20 MEQ tablet Commonly known as: KLOR-CON Take 2 tablets (40 mEq total) by mouth daily.   PRESCRIPTION MEDICATION Onmipod dash-insulin pump.Changes every 2 days. 5-25units ac based on blood sugar.   prochlorperazine 10 MG tablet Commonly known as: COMPAZINE Take 1 tablet (10 mg total) by mouth every 6 (six) hours as needed (Nausea or vomiting).   temazepam 15 MG capsule Commonly known as: RESTORIL Take 1 capsule (15 mg total) by mouth at bedtime as needed for sleep.       Allergies:  Allergies  Allergen Reactions  . Metformin Diarrhea and Nausea Only    Past Medical History, Surgical history, Social history, and Family History were reviewed and updated.  Review of Systems: Review of Systems  Constitutional: Negative.   HENT: Negative.   Eyes: Negative.   Respiratory: Negative.   Cardiovascular: Negative.   Gastrointestinal: Negative.   Genitourinary: Negative.   Musculoskeletal: Negative.   Skin: Negative.   Neurological: Negative.   Endo/Heme/Allergies: Negative.   Psychiatric/Behavioral: Negative.      Physical Exam:  weight is 158 lb 1.9 oz (71.7 kg). Her temporal temperature is 97.1 F (36.2 C) (abnormal). Her blood pressure is 126/54 (abnormal) and her pulse is 95. Her respiration is 20 and oxygen saturation is 100%.   Wt Readings from Last 3 Encounters:  03/13/20 158 lb 1.9 oz (71.7 kg)  02/22/20 164 lb 1.9 oz (74.4 kg)  02/15/20 167 lb 0.6 oz (75.8 kg)    Physical Exam Vitals reviewed.  HENT:     Head: Normocephalic and atraumatic.  Eyes:     Pupils: Pupils are equal, round, and reactive to light.  Cardiovascular:     Rate and Rhythm: Normal rate and regular rhythm.      Heart sounds: Normal heart sounds.  Pulmonary:     Effort: Pulmonary effort is normal.     Breath sounds: Normal breath sounds.  Abdominal:     General: Bowel sounds are normal.     Palpations: Abdomen is soft.  Musculoskeletal:        General: No tenderness or deformity. Normal range of motion.     Cervical back: Normal range of motion.  Lymphadenopathy:     Cervical: No cervical adenopathy.  Skin:  General: Skin is warm and dry.     Findings: No erythema or rash.  Neurological:     Mental Status: She is alert and oriented to person, place, and time.  Psychiatric:        Behavior: Behavior normal.        Thought Content: Thought content normal.        Judgment: Judgment normal.      Lab Results  Component Value Date   WBC 2.4 (L) 03/13/2020   HGB 8.7 (L) 03/13/2020   HCT 26.0 (L) 03/13/2020   MCV 99.6 03/13/2020   PLT 70 (L) 03/13/2020   Lab Results  Component Value Date   FERRITIN 1,606 (H) 02/15/2020   IRON 155 (H) 02/15/2020   TIBC 271 02/15/2020   UIBC 116 (L) 02/15/2020   IRONPCTSAT 57 02/15/2020   Lab Results  Component Value Date   RETICCTPCT 1.2 02/15/2020   RBC 2.61 (L) 03/13/2020   Lab Results  Component Value Date   KPAFRELGTCHN 478.0 (H) 02/22/2020   LAMBDASER 2.1 (L) 02/22/2020   KAPLAMBRATIO 227.62 (H) 02/22/2020   Lab Results  Component Value Date   IGGSERUM 2,266 (H) 02/22/2020   IGA 6 (L) 02/22/2020   IGMSERUM 8 (L) 02/22/2020   Lab Results  Component Value Date   TOTALPROTELP 7.3 02/22/2020   ALBUMINELP 3.6 02/22/2020   A1GS 0.2 02/22/2020   A2GS 0.9 02/22/2020   BETS 0.8 02/22/2020   GAMS 1.8 02/22/2020   MSPIKE 1.6 (H) 02/22/2020   SPEI Comment 01/18/2020     Chemistry      Component Value Date/Time   NA 138 03/13/2020 0824   K 3.2 (L) 03/13/2020 0824   CL 104 03/13/2020 0824   CO2 25 03/13/2020 0824   BUN 10 03/13/2020 0824   CREATININE 0.84 03/13/2020 0824   CREATININE 0.65 01/12/2018 1004      Component Value  Date/Time   CALCIUM 8.9 03/13/2020 0824   ALKPHOS 82 03/13/2020 0824   AST 15 03/13/2020 0824   ALT 10 03/13/2020 0824   BILITOT 0.6 03/13/2020 0824       Impression and Plan: Ms. Para is a very pleasant 69 yo caucasian female with IgG kappa myeloma, with what I would consider to have high risk cytogenetics.  Of note, she does have the t(11:14) translocation.  With this, we might be able to consider venetoclax.  Studies have shown that venetoclax is very active in myeloma patients with the t(11:14) translocation.  Would not have to admit her.  We can have to get her in the hospital.  She will need IV fluids.  I went had to bring people to see her.  I think she is probably going to need to have gastroenterology see her and do at least upper endoscopy on her to see what is going on.  I would also scan her brain to make sure there is nothing going on with her brain that might be causing the nausea and vomiting.  I also suspect that she might need a transfusion.  With hydration, I would think her hemoglobin is going to drop.  I am not sure why she is still thrombocytopenic.  We may have to consider a bone marrow biopsy on her.  I am worried about the diabetes.  I do still no if she is having adequate control.  We will see what her hemoglobin A1c is.  I just feel bad for her Ms. Greenfeld.  I just want her to have  a better quality of life.  She really does not have much of a quality of life right now.  I suspect she probably will be hospitalized for close to a week.  We will have to get a PICC line into her.  Hopefully we will not need to do any TNA to try to give her nutrition.  I know that she will get outstanding care from all the staff up on 6 E.    [name], MD 3/23/202112:52 PM

## 2020-03-13 NOTE — Progress Notes (Signed)
Inpatient Diabetes Program Recommendations  AACE/ADA: New Consensus Statement on Inpatient Glycemic Control (2015)  Target Ranges:  Prepandial:   less than 140 mg/dL      Peak postprandial:   less than 180 mg/dL (1-2 hours)      Critically ill patients:  140 - 180 mg/dL   Lab Results  Component Value Date   GLUCAP 250 (H) 11/03/2019   HGBA1C 8.5 (H) 05/26/2019    Review of Glycemic Control  Diabetes history: DM2 Outpatient Diabetes medications: Insulin pump Current orders for Inpatient glycemic control:   On CL diet. HgbA1C 10.5%  Spoke with pt about her insulin pump. States she thinks her basal rate is 1.5 units/H. Wanted to take pump off since she may need an MRI. Did not know CHO ratio or CF.  Inpatient Diabetes Program Recommendations:     Lantus 24 units QHS Novolog 0-9 units Q4H while on liquid diet May need small amount of CHO coverage for diet - 3 units Q4H. When eating, change Novolog to tidwc and hs. Titrate both basal and bolus daily.  Will follow closely.  Thank you. Lorenda Peck, RD, LDN, CDE Inpatient Diabetes Coordinator [phone]

## 2020-03-13 NOTE — Progress Notes (Signed)
Peripherally Inserted Central Catheter Placement  The IV Nurse has discussed with the patient and/or persons authorized to consent for the patient, the purpose of this procedure and the potential benefits and risks involved with this procedure.  The benefits include less needle sticks, lab draws from the catheter, and the patient may be discharged home with the catheter. Risks include, but not limited to, infection, bleeding, blood clot (thrombus formation), and puncture of an artery; nerve damage and irregular heartbeat and possibility to perform a PICC exchange if needed/ordered by physician.  Alternatives to this procedure were also discussed.  Bard Power PICC patient education guide, fact sheet on infection prevention and patient information card has been provided to patient /or left at bedside.    PICC Placement Documentation  PICC Double Lumen 03/13/20 PICC Right Brachial 35 cm 0 cm (Active)  Indication for Insertion or Continuance of Line Prolonged intravenous therapies 03/13/20 1446  Exposed Catheter (cm) 0 cm 03/13/20 1446  Site Assessment Clean;Intact;Dry 03/13/20 1446  Lumen #1 Status Flushed;Saline locked;Blood return noted 03/13/20 1446  Lumen #2 Status Flushed;Saline locked;Blood return noted 03/13/20 1446  Dressing Type Transparent 03/13/20 1446  Dressing Status Clean;Dry;Intact;Antimicrobial disc in place 03/13/20 1446  Dressing Change Due 03/20/20 03/13/20 1446       Gordan Payment 03/13/2020, 2:48 PM

## 2020-03-13 NOTE — Telephone Encounter (Signed)
Report called to [facility] for direct admit per order of Dr. [name] to room [phone].

## 2020-03-13 NOTE — Plan of Care (Signed)

## 2020-03-13 NOTE — H&P (Signed)
Hematology and Oncology Follow Up Visit  AITANA Moon [id number] [birth year] 69 y.o. 03/13/2020   Principle Diagnosis:  IgG Kappa myeloma -- 1p-, 13q-, 16q- and t(11:14)  Past Therapy: RVD -- start on 05/09/2019 -- d/c revlimid on 06/14/2019 -- d/c on 06/28/2019 S/P Kyphoplasty at L2 XRT to lumbar spine -- completed in [facility] -- startedon 07/12/2019- s/p cycle4 -- d/c on 10/19/2019 due to disease progression  Current Therapy:   Selinexor/Velcade -- start cycle #1 on 02/06/2020 - d/c on 03/03 Kyprolis/Faspro -- start on 10/26/2019 -- s/p cycle #4 -- d/c due to progression Xgeva 120 mg sq q 3 months -- next dose on03/2021   Interim History:  Vicki Moon is here today for follow-up.  She really is doing poorly.  I am not sure exactly what the reason is for her to be doing so poorly.  She is not able to eat she says.  Whenever she tries to eat, she throws up.  She has been off the Selinexor now for 3 weeks.  I would have to believe this is out of her system.  I know she has a diabetes.  I will know she may have diabetic gastroparesis.  She is dehydrated.  She is weak.  I just do not think that she is going to be able to go home.  Regard to have to admit her to try to figure out what is going on.  She is lost weight.  Again she says she cannot eat anything solid.  She was having diarrhea.  I just have a hard time believing any of this is from myeloma.  Her myeloma really is not all that bad.  Her M spike is only 1.6 g/dL.  Her IgG level is 2200 mg/dL.  Her kappa light chain is 48 mg/dL.  She has had no rashes.  She has had no recurrences of her MRSA abscesses.  She has had no fever.  There is been no obvious bleeding.  She has a little bit of a headache.  There is no visual changes.    At this point, her performance status is ECOG 2.   Medications:    Allergies:  Allergies  Allergen Reactions  . Metformin Diarrhea and Nausea Only    Past Medical History,  Surgical history, Social history, and Family History were reviewed and updated.  Review of Systems: Review of Systems  Constitutional: Negative.   HENT: Negative.   Eyes: Negative.   Respiratory: Negative.   Cardiovascular: Negative.   Gastrointestinal: Negative.   Genitourinary: Negative.   Musculoskeletal: Negative.   Skin: Negative.   Neurological: Negative.   Endo/Heme/Allergies: Negative.   Psychiatric/Behavioral: Negative.      Physical Exam:  oral temperature is 98.3 F (36.8 C). Her blood pressure is 134/60 and her pulse is 89. Her respiration is 20 and oxygen saturation is 99%.   Wt Readings from Last 3 Encounters:  03/13/20 158 lb 1.9 oz (71.7 kg)  02/22/20 164 lb 1.9 oz (74.4 kg)  02/15/20 167 lb 0.6 oz (75.8 kg)    Physical Exam Vitals reviewed.  HENT:     Head: Normocephalic and atraumatic.  Eyes:     Pupils: Pupils are equal, round, and reactive to light.  Cardiovascular:     Rate and Rhythm: Normal rate and regular rhythm.     Heart sounds: Normal heart sounds.  Pulmonary:     Effort: Pulmonary effort is normal.     Breath sounds: Normal breath sounds.  Abdominal:  General: Bowel sounds are normal.     Palpations: Abdomen is soft.  Musculoskeletal:        General: No tenderness or deformity. Normal range of motion.     Cervical back: Normal range of motion.  Lymphadenopathy:     Cervical: No cervical adenopathy.  Skin:    General: Skin is warm and dry.     Findings: No erythema or rash.  Neurological:     Mental Status: She is alert and oriented to person, place, and time.  Psychiatric:        Behavior: Behavior normal.        Thought Content: Thought content normal.        Judgment: Judgment normal.      Lab Results  Component Value Date   WBC 2.4 (L) 03/13/2020   HGB 8.7 (L) 03/13/2020   HCT 26.0 (L) 03/13/2020   MCV 99.6 03/13/2020   PLT 70 (L) 03/13/2020   Lab Results  Component Value Date   FERRITIN 1,606 (H) 02/15/2020    IRON 155 (H) 02/15/2020   TIBC 271 02/15/2020   UIBC 116 (L) 02/15/2020   IRONPCTSAT 57 02/15/2020   Lab Results  Component Value Date   RETICCTPCT 1.2 02/15/2020   RBC 2.61 (L) 03/13/2020   Lab Results  Component Value Date   KPAFRELGTCHN 478.0 (H) 02/22/2020   LAMBDASER 2.1 (L) 02/22/2020   KAPLAMBRATIO 227.62 (H) 02/22/2020   Lab Results  Component Value Date   IGGSERUM 2,266 (H) 02/22/2020   IGA 6 (L) 02/22/2020   IGMSERUM 8 (L) 02/22/2020   Lab Results  Component Value Date   TOTALPROTELP 7.3 02/22/2020   ALBUMINELP 3.6 02/22/2020   A1GS 0.2 02/22/2020   A2GS 0.9 02/22/2020   BETS 0.8 02/22/2020   GAMS 1.8 02/22/2020   MSPIKE 1.6 (H) 02/22/2020   SPEI Comment 01/18/2020     Chemistry      Component Value Date/Time   NA 138 03/13/2020 0824   K 3.2 (L) 03/13/2020 0824   CL 104 03/13/2020 0824   CO2 25 03/13/2020 0824   BUN 10 03/13/2020 0824   CREATININE 0.84 03/13/2020 0824   CREATININE 0.65 01/12/2018 1004      Component Value Date/Time   CALCIUM 8.9 03/13/2020 0824   ALKPHOS 82 03/13/2020 0824   AST 15 03/13/2020 0824   ALT 10 03/13/2020 0824   BILITOT 0.6 03/13/2020 0824       Impression and Plan: Ms. Vicki Moon is a very pleasant 69 yo caucasian female with IgG kappa myeloma, with what I would consider to have high risk cytogenetics.  Of note, she does have the t(11:14) translocation.  With this, we might be able to consider venetoclax.  Studies have shown that venetoclax is very active in myeloma patients with the t(11:14) translocation.  Would not have to admit her.  We can have to get her in the hospital.  She will need IV fluids.  I went had to bring people to see her.  I think she is probably going to need to have gastroenterology see her and do at least upper endoscopy on her to see what is going on.  I would also scan her brain to make sure there is nothing going on with her brain that might be causing the nausea and vomiting.  I also suspect  that she might need a transfusion.  With hydration, I would think her hemoglobin is going to drop.  I am not sure why she is  still thrombocytopenic.  We may have to consider a bone marrow biopsy on her.  I am worried about the diabetes.  I do still no if she is having adequate control.  We will see what her hemoglobin A1c is.  I just feel bad for her Ms. Savitz.  I just want her to have a better quality of life.  She really does not have much of a quality of life right now.  I suspect she probably will be hospitalized for close to a week.  We will have to get a PICC line into her.  Hopefully we will not need to do any TNA to try to give her nutrition.  I know that she will get outstanding care from all the staff up on [facility].    [name], MD 3/23/20211:00 PM

## 2020-03-14 ENCOUNTER — Telehealth: Payer: Self-pay | Admitting: Hematology & Oncology

## 2020-03-14 ENCOUNTER — Encounter (HOSPITAL_COMMUNITY): Payer: Self-pay | Admitting: Hematology & Oncology

## 2020-03-14 LAB — CBC WITH DIFFERENTIAL/PLATELET
Abs Immature Granulocytes: 0.13 10*3/uL — ABNORMAL HIGH (ref 0.00–0.07)
Basophils Absolute: 0 10*3/uL (ref 0.0–0.1)
Basophils Relative: 0 %
Eosinophils Absolute: 0.1 10*3/uL (ref 0.0–0.5)
Eosinophils Relative: 4 %
HCT: 23.5 % — ABNORMAL LOW (ref 36.0–46.0)
Hemoglobin: 7.6 g/dL — ABNORMAL LOW (ref 12.0–15.0)
Immature Granulocytes: 5 %
Lymphocytes Relative: 20 %
Lymphs Abs: 0.5 10*3/uL — ABNORMAL LOW (ref 0.7–4.0)
MCH: 33.5 pg (ref 26.0–34.0)
MCHC: 32.3 g/dL (ref 30.0–36.0)
MCV: 103.5 fL — ABNORMAL HIGH (ref 80.0–100.0)
Monocytes Absolute: 0.3 10*3/uL (ref 0.1–1.0)
Monocytes Relative: 12 %
Neutro Abs: 1.5 10*3/uL — ABNORMAL LOW (ref 1.7–7.7)
Neutrophils Relative %: 59 %
Platelets: 65 10*3/uL — ABNORMAL LOW (ref 150–400)
RBC: 2.27 MIL/uL — ABNORMAL LOW (ref 3.87–5.11)
RDW: 16.6 % — ABNORMAL HIGH (ref 11.5–15.5)
WBC: 2.6 10*3/uL — ABNORMAL LOW (ref 4.0–10.5)
nRBC: 0 % (ref 0.0–0.2)

## 2020-03-14 LAB — ABO/RH: ABO/RH(D): O POS

## 2020-03-14 LAB — URINE CULTURE: Culture: 50000 — AB

## 2020-03-14 LAB — IGG, IGA, IGM
IgA: 11 mg/dL — ABNORMAL LOW (ref 64–422)
IgG (Immunoglobin G), Serum: 2405 mg/dL — ABNORMAL HIGH (ref 586–1602)
IgM (Immunoglobulin M), Srm: 14 mg/dL — ABNORMAL LOW (ref 26–217)

## 2020-03-14 LAB — GLUCOSE, CAPILLARY
Glucose-Capillary: 122 mg/dL — ABNORMAL HIGH (ref 70–99)
Glucose-Capillary: 129 mg/dL — ABNORMAL HIGH (ref 70–99)
Glucose-Capillary: 136 mg/dL — ABNORMAL HIGH (ref 70–99)
Glucose-Capillary: 138 mg/dL — ABNORMAL HIGH (ref 70–99)
Glucose-Capillary: 142 mg/dL — ABNORMAL HIGH (ref 70–99)
Glucose-Capillary: 152 mg/dL — ABNORMAL HIGH (ref 70–99)
Glucose-Capillary: 157 mg/dL — ABNORMAL HIGH (ref 70–99)

## 2020-03-14 LAB — COMPREHENSIVE METABOLIC PANEL
ALT: 12 U/L (ref 0–44)
AST: 21 U/L (ref 15–41)
Albumin: 2.7 g/dL — ABNORMAL LOW (ref 3.5–5.0)
Alkaline Phosphatase: 68 U/L (ref 38–126)
Anion gap: 6 (ref 5–15)
BUN: 6 mg/dL — ABNORMAL LOW (ref 8–23)
CO2: 22 mmol/L (ref 22–32)
Calcium: 8.3 mg/dL — ABNORMAL LOW (ref 8.9–10.3)
Chloride: 113 mmol/L — ABNORMAL HIGH (ref 98–111)
Creatinine, Ser: 0.73 mg/dL (ref 0.44–1.00)
GFR calc Af Amer: >60 mL/min (ref >60)
GFR calc non Af Amer: >60 mL/min (ref >60)
Glucose, Bld: 126 mg/dL — ABNORMAL HIGH (ref 70–99)
Potassium: 3.4 mmol/L — ABNORMAL LOW (ref 3.5–5.1)
Sodium: 141 mmol/L (ref 135–145)
Total Bilirubin: 0.7 mg/dL (ref 0.3–1.2)
Total Protein: 6.9 g/dL (ref 6.5–8.1)

## 2020-03-14 LAB — KAPPA/LAMBDA LIGHT CHAINS
Kappa free light chain: 350.2 mg/L — ABNORMAL HIGH (ref 3.3–19.4)
Kappa, lambda light chain ratio: 35.37 — ABNORMAL HIGH (ref 0.26–1.65)
Lambda free light chains: 9.9 mg/L (ref 5.7–26.3)

## 2020-03-14 MED ORDER — CHLORHEXIDINE GLUCONATE CLOTH 2 % EX PADS
6.0000 | MEDICATED_PAD | Freq: Every day | CUTANEOUS | Status: AC
  Administered 2020-03-18: 6 via TOPICAL

## 2020-03-14 MED ORDER — DIPHENOXYLATE-ATROPINE 2.5-0.025 MG/5ML PO LIQD: 5.0000 mL | Freq: Two times a day (BID) | ORAL | Status: DC | PRN

## 2020-03-14 MED ORDER — MUPIROCIN 2 % EX OINT
1.0000 "application " | TOPICAL_OINTMENT | Freq: Two times a day (BID) | CUTANEOUS | Status: AC
  Administered 2020-03-14 – 2020-03-18 (×9): 1 via NASAL
  Filled 2020-03-14: qty 22

## 2020-03-14 NOTE — Consult Note (Signed)
[facility] Consultation Note  Referring Provider: [facility] Primary Care Physician:  [name], MD Primary Gastroenterologist:  Dr. Laural Golden  Reason for Consultation:  Nausea, vomiting  HPI: Vicki Moon is a 69 y.o. female admitted for nausea and vomiting.  History of multiple myeloma, for which she's had few different courses of chemotherapy.  After most recently course of therapy, Selinexor/Velcade, which she started about one month ago, patient reports that she immediately developed nausea and vomiting; no prior issues with nausea and vomiting until she started this therapy.  Unfortunately, no improvement in nausea/vomiting even several weeks after stopping the Selinexor/Velcade.  No abdominal pain, dysphagia, blood in stool.  Has chronic diarrhea.  Lost 30-40 lbs since the inception of her nausea/vomiting.  No known prior endoscopy.   Past Medical History:  Diagnosis Date  . Diabetes mellitus without complication (HCC)    Type 2 IDDM x 5 years  . 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 Glaucoma   . Goals of care, counseling/discussion 04/29/2019  . Herniated lumbar intervertebral disc 06/2014  . Multiple myeloma ([place]) 04/29/2019  . PONV (postoperative nausea and vomiting)     Past Surgical History:  Procedure Laterality Date  . CHOLECYSTECTOMY    . COLONOSCOPY N/A 06/28/2015   Procedure: COLONOSCOPY;  Surgeon: Rogene Houston, MD;  Location: [facility];  Service: Endoscopy;  Laterality: N/A;  155  . EYE SURGERY    . IR BONE TUMOR(S)RF ABLATION  04/28/2019  . IR KYPHO LUMBAR INC FX REDUCE BONE BX UNI/BIL CANNULATION INC/IMAGING  04/28/2019  . IR RADIOLOGIST EVAL & MGMT  04/22/2019  . LUMBAR LAMINECTOMY Right 07/12/2014   Procedure: LUMBAR FIVE TO SACRAL ONE MICRODISCECTOMY;  Surgeon: Marybelle Killings, MD;  Location: [facility];  Service: Orthopedics;  Laterality: Right;  . TUBAL LIGATION    . VITRECTOMY 25 GAUGE WITH SCLERAL BUCKLE Left 07/29/2017   Procedure:  RETINAL DETACHMENT REPAIR LEFT EYE WITH PARSPLANA VITRECTOMY, AIR FLUID EXCHANGE, ENDO LASTER, DRAINAGE OF SUBRETINAL FLUID,ENDOLASER PPV /25 GAUGE;  Surgeon: Jalene Mullet, MD;  Location: [facility];  Service: Ophthalmology;  Laterality: Left;    Prior to Admission medications   Medication Sig Start Date End Date Taking? Authorizing Provider  ALPRAZolam Duanne Moron) 0.5 MG tablet Take 0.5 mg by mouth at bedtime as needed.  05/10/19  Yes [provider]  glipiZIDE (GLUCOTROL XL) 10 MG 24 hr tablet Take 10 mg by mouth daily.  04/04/19  Yes [provider]  HYDROcodone-acetaminophen (NORCO/VICODIN) 5-325 MG tablet Take 1 tablet by mouth every 6 (six) hours as needed for moderate pain. Received from [facility] ED.   Yes [provider]  ibuprofen (ADVIL) 800 MG tablet Take 800 mg by mouth every 8 (eight) hours as needed. 12/02/19  Yes [provider]  Insulin Disposable Pump (OMNIPOD DASH SYSTEM) KIT 1 Device by Does not apply route 3 (three) times a week. Patient put on insulin pump every 2 to 3 days for insulin delivery   Yes [provider]  latanoprost (XALATAN) 0.005 % ophthalmic solution Place 1 drop into both eyes at bedtime.   Yes [provider]  lisinopril (ZESTRIL) 10 MG tablet Take 10 mg by mouth daily. 10/17/19  Yes [provider]  NOVOLOG 100 UNIT/ML injection Inject 1.2-25 Units into the skin as directed. PATIENT USES INSULIN PUMP  Patient has a set basal rate of 1.2-1.5u per hour with meal time boluses of up to 25 units 05/06/19  Yes [provider]  ondansetron (ZOFRAN) 8 MG tablet Take 1 tablet (8 mg total) by mouth every 8 (eight) hours as needed for nausea or vomiting. 11/30/19  Yes [name], MD  pantoprazole (PROTONIX) 40 MG tablet Take 1 tablet (40 mg total) by mouth daily. Patient taking differently: Take 40 mg by mouth daily as needed (acid reflux).  02/15/20  Yes [name], MD  PARoxetine (PAXIL) 30 MG  tablet Take 30 mg by mouth every morning. 07/12/18  Yes [provider]  [place] dash-insulin pump.Changes every 2 days. 5-25units ac based on blood sugar.   Yes [provider]  prochlorperazine (COMPAZINE) 10 MG tablet Take 1 tablet (10 mg total) by mouth every 6 (six) hours as needed (Nausea or vomiting). 11/23/19  Yes [name], MD  temazepam (RESTORIL) 15 MG capsule Take 1 capsule (15 mg total) by mouth at bedtime as needed for sleep. 01/05/20  Yes Ennever, Rudell Cobb, MD  Continuous Blood Gluc Sensor ([place]) MISC Use one sensor every 10 days. 10/14/17   Cassandria Anger, MD  cyclobenzaprine (FLEXERIL) 5 MG tablet Take 1 tablet (5 mg total) by mouth 3 (three) times daily as needed. Patient not taking: Reported on 02/15/2020 09/03/19   Rolland Porter, MD  diphenoxylate-atropine (LOMOTIL) 2.5-0.025 MG tablet Take 1 tablet by mouth 4 (four) times daily as needed for diarrhea or loose stools. 03/12/20   [name], MD  granisetron (SANCUSO) 3.1 MG/24HR Apply to skin starting 24 hours before chemotherapy. Remove after 7 days. Patient not taking: Reported on 03/13/2020 02/15/20   [name], MD  potassium chloride SA (K-DUR) 20 MEQ tablet Take 2 tablets (40 mEq total) by mouth daily. Patient not taking: Reported on 02/15/2020 07/19/19   [name], MD    Current Facility-Administered Medications  Medication Dose Route Frequency Provider Last Rate Last Admin  . 0.9 % NaCl with KCl 20 mEq/ L  infusion   Intravenous Continuous Mikey Bussing R, NP 75 mL/hr at 03/14/20 0300 Rate Verify at 03/14/20 0300  . ALPRAZolam (XANAX) tablet 0.5 mg  0.5 mg Oral QHS PRN Maryanna Shape, NP      . Chlorhexidine Gluconate Cloth 2 % PADS 6 each  6 each Topical Daily [name], MD   6 each at 03/14/20 [phone]  . Chlorhexidine Gluconate Cloth 2 % PADS 6 each  6 each Topical Q0600 [name], MD      . diphenoxylate-atropine  (LOMOTIL) 2.5-0.025 MG per tablet 1 tablet  1 tablet Oral QID PRN Mikey Bussing R, NP      . enoxaparin (LOVENOX) injection 40 mg  40 mg Subcutaneous Q24H Ennever, Rudell Cobb, MD      . feeding supplement (BOOST / RESOURCE BREEZE) liquid 1 Container  1 Container Oral TID BM [name], MD   1 Container at 03/14/20 [phone]  . insulin aspart (novoLOG) injection 0-9 Units  0-9 Units Subcutaneous Q4H [name], MD   1 Units at 03/14/20 [phone]  . insulin glargine (LANTUS) injection 24 Units  24 Units Subcutaneous QHS [name], MD   24 Units at 03/13/20 2122  . latanoprost (XALATAN) 0.005 % ophthalmic solution 1 drop  1 drop Both Eyes QHS Mikey Bussing R, NP   1 drop at 03/13/20 2122  . morphine 2 MG/ML injection 2 mg  2 mg Intravenous Q4H PRN [name], MD      . mupirocin ointment (BACTROBAN) 2 %  1 application  1 application Nasal BID [name], MD   1 application at 35/57/32 [phone]  . ondansetron (ZOFRAN) 8 mg in sodium chloride 0.9 % 50 mL IVPB  8 mg Intravenous Q8H [name], MD 216 mL/hr at 03/14/20 0746 8 mg at 03/14/20 0746  . PARoxetine (PAXIL) tablet 30 mg  30 mg Oral q morning - 10a Maryanna Shape, NP   30 mg at 03/14/20 0948  . prochlorperazine (COMPAZINE) tablet 10 mg  10 mg Oral Q6H PRN Curcio, Kristin R, NP      . sodium chloride flush (NS) 0.9 % injection 10-40 mL  10-40 mL Intracatheter Q12H [name], MD   10 mL at 03/14/20 0940  . sodium chloride flush (NS) 0.9 % injection 10-40 mL  10-40 mL Intracatheter PRN [name], MD        Allergies as of 03/13/2020 - Review Complete 03/13/2020  Allergen Reaction Noted  . Metformin Diarrhea and Nausea Only 08/05/2017    Family History  Problem Relation Age of Onset  . Hypertension Mother   . Stroke Mother   . Cancer Father        brain tumor  . Diabetes Father     Social History   Socioeconomic History  . Marital status: Married    Spouse name: Not on file  . Number of  children: Not on file  . Years of education: Not on file  . Highest education level: Not on file  Occupational History  . Not on file  Tobacco Use  . Smoking status: Current Some Day Smoker    Packs/day: 1.00    Years: 10.00    Pack years: 10.00    Types: Cigarettes    Last attempt to quit: 02/20/2019    Years since quitting: 1.0  . Smokeless tobacco: Never Used  . Tobacco comment: 1 pack/month   Substance and Sexual Activity  . Alcohol use: No    Alcohol/week: 0.0 standard drinks  . Drug use: No  . Sexual activity: Yes  Other Topics Concern  . Not on file  Social History Narrative  . Not on file   Social Determinants of Health   Financial Resource Strain:   . Difficulty of Paying Living Expenses:   Food Insecurity:   . Worried About Charity fundraiser in the Last Year:   . Arboriculturist in the Last Year:   Transportation Needs:   . Film/video editor (Medical):   Marland Kitchen Lack of Transportation (Non-Medical):   Physical Activity:   . Days of Exercise per Week:   . Minutes of Exercise per Session:   Stress:   . Feeling of Stress :   Social Connections:   . Frequency of Communication with Friends and Family:   . Frequency of Social Gatherings with Friends and Family:   . Attends Religious Services:   . Active Member of Clubs or Organizations:   . Attends Archivist Meetings:   Marland Kitchen Marital Status:   Intimate Partner Violence:   . Fear of Current or Ex-Partner:   . Emotionally Abused:   Marland Kitchen Physically Abused:   . Sexually Abused:     Review of Systems: As per HPI, all others negative  Physical Exam: Vital signs in last 24 hours: Temp:  [98.4 F (36.9 C)] 98.4 F (36.9 C) (03/23 2037) Pulse Rate:  [85] 85 (03/23 2037) Resp:  [18] 18 (03/23 2037) BP: (136)/(40) 136/40 (03/23 2037) SpO2:  [  97 %] 97 % (03/23 2037) Weight:  [71 kg] 71 kg (03/24 0615) Last BM Date: 03/13/20 General:   Alert,  Fatigued- and weak-appearing, but in NAD Head:  Normocephalic  and atraumatic. Eyes:  Sclera clear, no icterus.   Conjunctiva pink. Ears:  Normal auditory acuity. Nose:  No deformity, discharge,  or lesions. Mouth:  No deformity or lesions.  Oropharynx pink & moist. Neck:  Supple; no masses or thyromegaly. Lungs:  Clear throughout to auscultation.   No wheezes, crackles, or rhonchi. No acute distress. Heart:  Regular rate and rhythm; no murmurs, clicks, rubs,  or gallops. Abdomen:  Soft, nontender and nondistended. No masses, hepatosplenomegaly or hernias noted. Normal bowel sounds, without guarding, and without rebound.     Msk:  Symmetrical without gross deformities. Normal posture. Pulses:  Normal pulses noted. Extremities:  Without clubbing or edema. Neurologic:  Alert and  oriented x4;  grossly normal neurologically. Skin:  Intact without significant lesions or rashes. Psych:  Alert and cooperative. Normal mood and affect.   Lab Results: Recent Labs    03/13/20 0824 03/13/20 1332 03/14/20 0738  WBC 2.4* 2.6* 2.6*  HGB 8.7* 8.2* 7.6*  HCT 26.0* 25.4* 23.5*  PLT 70* 68* 65*   BMET Recent Labs    03/13/20 0824 03/13/20 1259 03/14/20 0738  NA 138  --  141  K 3.2*  --  3.4*  CL 104  --  113*  CO2 25  --  22  GLUCOSE 228*  --  126*  BUN 10  --  6*  CREATININE 0.84 0.79 0.73  CALCIUM 8.9  --  8.3*   LFT Recent Labs    03/14/20 0738  PROT 6.9  ALBUMIN 2.7*  AST 21  ALT 12  ALKPHOS 68  BILITOT 0.7   PT/INR No results for input(s): LABPROT, INR in the last 72 hours.  Studies/Results: CT Head W Wo Contrast  Result Date: 03/13/2020 CLINICAL DATA:  69 year old female with multiple myeloma. Intractable vomiting. EXAM: CT HEAD WITHOUT AND WITH CONTRAST TECHNIQUE: Contiguous axial images were obtained from the base of the skull through the vertex without and with intravenous contrast CONTRAST:  4m OMNIPAQUE IOHEXOL 300 MG/ML  SOLN COMPARISON:  Brain MRI 11/03/2019 without contrast and earlier. FINDINGS: Brain: Cerebral volume  remains normal for age. No midline shift, ventriculomegaly, mass effect, evidence of mass lesion, intracranial hemorrhage or evidence of cortically based acute infarction. Gray-white matter differentiation is within normal limits throughout the brain. No abnormal enhancement identified. Vascular: The major intracranial vascular structures appear to be enhancing as expected. Skull: Stable occasional small nonspecific lucent areas in the right calvarium since November on series 3, images 47 and 54 today. Minimal similar lucency in the left calvarium. No new or suspicious osseous lesion identified. Sinuses/Orbits: Chronic right sphenoid sinusitis with mucoperiosteal thickening has not significantly changed. Other Visualized paranasal sinuses and mastoids are stable and well pneumatized. Other: No acute orbit or scalp soft tissue finding. IMPRESSION: 1. Several small lucent lesions in the calvarium are stable and remain indeterminate for myeloma involvement. No new skull lesion. 2. Stable since November and normal for age CT appearance of the brain. 3. Chronic right sphenoid sinusitis. Electronically Signed   By: HGenevie AnnM.D.   On: 03/13/2020 19:13   [place]  Result Date: 03/13/2020 [facility], placement could not be confirmed due to current cardiac rhythm.  Impression:  1.  Nausea and vomiting.  Patient reports she had  essentially no issues with nausea and vomiting until she received her Selinexor/Velcade chemotherapy for myeloma, yet her nausea/vomiting symptoms have persisted for 3 weeks after stopping her chemotherapy.  Can Selinexor/Velcade cause irreversible gastroparesis, or enhance gastroparesis from someone otherwise prone (diabetes) to this condition? 2.  Weight loss, 30-40 lbs, likely from #1 above. 3.  Multiple myeloma with pancytopenia. 4.  Diarrhea.  This is longstanding issue for multiple years, and for which she has had colonoscopy (unrevealing other than  diverticulosis and hyperplastic polyps) and other work-up by Dr. Laural Golden about 5 years ago.  Plan:  1.  Scheduled and prn antiemetics, as is currently being done. 2.  Gastric emptying study has already been ordered for tomorrow. 3.  Pending and after gastric emptying study, could try course of metoclopramide. 4.  Lomotil has lots of anticholinergic activity, which could exacerbate poor gastric emptying and could in turn exacerbate nausea/vomiting; will stop Lomotil and start loperamide instead (which only has principal antimotility activity in colon alone) for patient's diarrhea. 5.  [facility] will revisit tomorrow; not inclined to do endoscopy until her thrombocytopenia improves and uptrends (currently low and downtrending), so UGI series may be diagnostic imaging possibility if gastric emptying study is unrevealing.   LOS: 1 day   Haldon Carley M  03/14/2020, 11:49 AM  Cell [phone] If no answer or after 5 PM call [phone]

## 2020-03-14 NOTE — Progress Notes (Signed)
Initial Nutrition Assessment  DOCUMENTATION CODES:   Not applicable  INTERVENTION:  -Continue Boost Breeze po TID, each supplement provides 250 kcal and 9 grams of protein  -Monitor for findings from gastric emptying study  -Will provide additional ONS as appropriate with diet advancement per GI recommendations  -Recommend monitoring magnesium, potassium, and phosphorus daily for at least 3 days with po intake, MD to replete as needed, as pt is at risk for refeeding syndrome given reported history of persistent N/V x 3 weeks, chronic diarrhea, low K noted per labs.  NUTRITION DIAGNOSIS:   Inadequate oral intake related to cancer and cancer related treatments as evidenced by percent weight loss, per patient/family report(unable to tolerate po intake due to persistent nausea and vomiting over the past 3 weeks).  GOAL:   Patient will meet greater than or equal to 90% of their needs    MONITOR:   Weight trends, Labs, Supplement acceptance, PO intake, Diet advancement  REASON FOR ASSESSMENT:   Malnutrition Screening Tool    ASSESSMENT:   RD working remotely.  69 year old female with past medical history of IDDM2, erythropoietin deficiency anemia, herniated lumbar intervertebral disc, chronic diarrhea and multiple myeloma currently undergoing chemotherapy (last treatment with Selinexor/Velcade started ~ 1 month ago) presented with ongoing nausea/vomiting since starting chemotherapy and reports 30-40 lb wt loss.  3/23 PICC  RD unable to reach patient via phone today.Per chart review, patient presented to outpatient oncology follow-up with weakness, dehydration and reports persisting N/V over the past 3 weeks s/p Selinexor/Velcade chemotherapy on 02/06/20. Patient is currently on a clear liquid diet and is provided Boost Breeze supplement TID per medication review. No documented intakes at this time, NPO at midnight for study. Will monitor for diet advancement s/p study and provide  nutrition supplements as appropriate.  Per notes: -gastric emptying study ordered for tomorrow -stop Lomotil, start loperamide for diarrhea -endoscopy after thrombocytopenia improves and uptrends  Current wt 156.2 lbs Per chart review, on 11/23/19 pt wt 190.3 lbs, on 12/21/19 pt wt 186.34 lbs, on 12/29/19 pt wt 181.72 lbs, on 01/18/20 pt wt 182.38 lbs, on 02/15/20 pt wt 166.76 lbs, on 02/22/20 pt wt 163.88 lbs. This indicates a 34 lb wt (17.9%) loss over the past 3.5 months and a 10.56 lb (6.3%) wt loss in 1 month which is significant.   Medications reviewed and include: SSI, Lantus, Paxil IVF: NaCl with KCl 20 mEq @ 75 ml/hr IVPB: Zofran 8 mg every 8 hrs Labs: CBGs 136,122,138 x 24 hrs, K 3.4 (H), BUN 6 (L), WBC 2.6 (L), Hgb 7.6 (L) Lab Results  Component Value Date   HGBA1C 7.9 (H) 03/13/2020    Given patient's history of multiple myeloma, chronic diarrhea, significant wt loss, and recent history of persistent N/V, highly suspect malnutrition, however unable to identify at this time without completion of NFPE and or dietary recall. Will plan on completing physical assessment/nutrition history at follow-up.  NUTRITION - FOCUSED PHYSICAL EXAM: Unable to complete at this time, RD working remotely.  Diet Order:   Diet Order            Diet clear liquid Room service appropriate? Yes; Fluid consistency: Thin  Diet effective now              EDUCATION NEEDS:   No education needs have been identified at this time  Skin:  Skin Assessment: Reviewed RN Assessment  Last BM:  3/24 type 7  Height:   Ht Readings from Last 1  Encounters:  03/13/20 5' 2" (1.575 m)    Weight:   Wt Readings from Last 1 Encounters:  03/14/20 71 kg    Ideal Body Weight:  50 kg  BMI:  Body mass index is 28.63 kg/m.  Estimated Nutritional Needs:   Kcal:  2100-2300  Protein:  105-120  Fluid:  >/= 2.1 L/day   Suzanne Clayton, RD, LDN Clinical Nutrition After Hours/Weekend Pager # in  Amion  

## 2020-03-14 NOTE — Telephone Encounter (Signed)
Dr [name] is requesting Gastric Emptying Study to be completed while patient is IP at [facility].  Onc. Nurse Arva Chafe called stating this could not be completed while IP.  Dr [name] is requesting it to be done ASAP. I called and spoke with Linna Darner Nurse in [place]. Med who stated this can be completed while patient is IP @ [facility].  She stated she will advise that patient will be NPO after midnight tonight and testing will be completed tomorrow morning.  Dr [name] was advised of this as well.

## 2020-03-14 NOTE — Telephone Encounter (Signed)
No los 3/23-Direct Admit

## 2020-03-14 NOTE — Progress Notes (Signed)
Vicki Moon is really about the same.  She has a PICC line in the right arm.  She is getting IV fluids.  She had a few clear liquids.  Her urinalysis really is unremarkable.  I will have to get gastroenterology to see her.  I do worry about gastroparesis from her diabetes.  I really cannot hear much in the way of bowel sounds when I listen to her..  She did have some diarrhea yesterday.  There is no bleeding.  She has had no cough.  No lab work results are back yet.  She had a CT scan of the brain.  This did not show anything obvious.  I will get a nuclear medicine gastric emptying scan to see if she just is not emptying properly.  Her vital signs are all stable.  Her temperature is 98.4.  Pulse 85.  Blood pressure 136/40.  Head and neck exam shows no scleral icterus.  She has no oral lesions.  Oral mucosa still slightly dry.  Lungs are clear.  Cardiac exam regular rate and rhythm.  Abdomen is soft.  Bowel sounds are minimally present.  There is no guarding or rebound tenderness.  There is no obvious fluid wave.  Extremities shows no clubbing cyanosis or edema she has good range of motion of the joints.  She has good strength.  Neurological exam shows no focal neurological deficits.  Skin exam shows some scattered ecchymoses.  Ms. Speaker has myeloma that I would consider refractory.  She does not have a high level of myeloma.  She has been off therapy for 3 weeks.  I am still not clear as to why she is having this vomiting and diarrhea.  Again, we will get gastroenterology to see her and see if they can help [place] out.  I would think that a upper endoscopy probably would be indicated.  I will see what her labs look like.  We may have to consider a another bone marrow test on her to see what might be going on in the bone marrow.  This is pretty complicated.  I very much appreciate all the staff's help on 6 E.  I know that they are doing a fantastic job.  Lattie Haw, MD  Hebrews 12:12

## 2020-03-15 ENCOUNTER — Ambulatory Visit: Payer: Medicare Other

## 2020-03-15 ENCOUNTER — Inpatient Hospital Stay: Payer: Medicare Other | Admitting: Hematology & Oncology

## 2020-03-15 ENCOUNTER — Inpatient Hospital Stay (HOSPITAL_COMMUNITY): Payer: Medicare Other

## 2020-03-15 ENCOUNTER — Inpatient Hospital Stay: Payer: Medicare Other

## 2020-03-15 ENCOUNTER — Telehealth: Payer: Self-pay | Admitting: *Deleted

## 2020-03-15 LAB — GI PATHOGEN PANEL BY PCR, STOOL

## 2020-03-15 LAB — CBC WITH DIFFERENTIAL/PLATELET
Abs Immature Granulocytes: 0.08 10*3/uL — ABNORMAL HIGH (ref 0.00–0.07)
Basophils Absolute: 0 10*3/uL (ref 0.0–0.1)
Basophils Relative: 0 %
Eosinophils Absolute: 0.1 10*3/uL (ref 0.0–0.5)
Eosinophils Relative: 3 %
HCT: 21.8 % — ABNORMAL LOW (ref 36.0–46.0)
Hemoglobin: 7.2 g/dL — ABNORMAL LOW (ref 12.0–15.0)
Immature Granulocytes: 4 %
Lymphocytes Relative: 21 %
Lymphs Abs: 0.5 10*3/uL — ABNORMAL LOW (ref 0.7–4.0)
MCH: 34 pg (ref 26.0–34.0)
MCHC: 33 g/dL (ref 30.0–36.0)
MCV: 102.8 fL — ABNORMAL HIGH (ref 80.0–100.0)
Monocytes Absolute: 0.2 10*3/uL (ref 0.1–1.0)
Monocytes Relative: 11 %
Neutro Abs: 1.4 10*3/uL — ABNORMAL LOW (ref 1.7–7.7)
Neutrophils Relative %: 61 %
Platelets: 58 10*3/uL — ABNORMAL LOW (ref 150–400)
RBC: 2.12 MIL/uL — ABNORMAL LOW (ref 3.87–5.11)
RDW: 16.4 % — ABNORMAL HIGH (ref 11.5–15.5)
WBC: 2.3 10*3/uL — ABNORMAL LOW (ref 4.0–10.5)
nRBC: 0.9 % — ABNORMAL HIGH (ref 0.0–0.2)

## 2020-03-15 LAB — COMPREHENSIVE METABOLIC PANEL
ALT: 10 U/L (ref 0–44)
AST: 14 U/L — ABNORMAL LOW (ref 15–41)
Albumin: 2 g/dL — ABNORMAL LOW (ref 3.5–5.0)
Alkaline Phosphatase: 52 U/L (ref 38–126)
Anion gap: 5 (ref 5–15)
BUN: <5 mg/dL — ABNORMAL LOW (ref 8–23)
CO2: 18 mmol/L — ABNORMAL LOW (ref 22–32)
Calcium: 6.4 mg/dL — CL (ref 8.9–10.3)
Chloride: 118 mmol/L — ABNORMAL HIGH (ref 98–111)
Creatinine, Ser: 0.6 mg/dL (ref 0.44–1.00)
GFR calc Af Amer: >60 mL/min (ref >60)
GFR calc non Af Amer: >60 mL/min (ref >60)
Glucose, Bld: 92 mg/dL (ref 70–99)
Potassium: 2.7 mmol/L — CL (ref 3.5–5.1)
Sodium: 141 mmol/L (ref 135–145)
Total Bilirubin: 0.5 mg/dL (ref 0.3–1.2)
Total Protein: 5.2 g/dL — ABNORMAL LOW (ref 6.5–8.1)

## 2020-03-15 LAB — PROTEIN ELECTROPHORESIS, SERUM
A/G Ratio: 0.7 (ref 0.7–1.7)
Albumin ELP: 3 g/dL (ref 2.9–4.4)
Alpha-1-Globulin: 0.3 g/dL (ref 0.0–0.4)
Alpha-2-Globulin: 1.1 g/dL — ABNORMAL HIGH (ref 0.4–1.0)
Beta Globulin: 0.9 g/dL (ref 0.7–1.3)
Gamma Globulin: 1.9 g/dL — ABNORMAL HIGH (ref 0.4–1.8)
Globulin, Total: 4.2 g/dL — ABNORMAL HIGH (ref 2.2–3.9)
M-Spike, %: 1.6 g/dL — ABNORMAL HIGH
Total Protein ELP: 7.2 g/dL (ref 6.0–8.5)

## 2020-03-15 LAB — GLUCOSE, CAPILLARY
Glucose-Capillary: 113 mg/dL — ABNORMAL HIGH (ref 70–99)
Glucose-Capillary: 100 mg/dL — ABNORMAL HIGH (ref 70–99)
Glucose-Capillary: 116 mg/dL — ABNORMAL HIGH (ref 70–99)
Glucose-Capillary: 121 mg/dL — ABNORMAL HIGH (ref 70–99)
Glucose-Capillary: 141 mg/dL — ABNORMAL HIGH (ref 70–99)

## 2020-03-15 LAB — HEMOGLOBIN AND HEMATOCRIT, BLOOD
HCT: 28 % — ABNORMAL LOW (ref 36.0–46.0)
Hemoglobin: 9.2 g/dL — ABNORMAL LOW (ref 12.0–15.0)

## 2020-03-15 LAB — PREPARE RBC (CROSSMATCH)

## 2020-03-15 MED ORDER — SODIUM CHLORIDE 0.9% IV SOLUTION: Freq: Once | INTRAVENOUS | Status: DC

## 2020-03-15 MED ORDER — SACCHAROMYCES BOULARDII 250 MG PO CAPS
250.0000 mg | ORAL_CAPSULE | Freq: Two times a day (BID) | ORAL | Status: DC
  Administered 2020-03-16 – 2020-03-23 (×11): 250 mg via ORAL
  Filled 2020-03-15 (×15): qty 1

## 2020-03-15 MED ORDER — FUROSEMIDE 10 MG/ML IJ SOLN
20.0000 mg | Freq: Once | INTRAMUSCULAR | Status: AC
  Administered 2020-03-15: 20 mg via INTRAVENOUS
  Filled 2020-03-15: qty 2

## 2020-03-15 MED ORDER — POTASSIUM CHLORIDE 10 MEQ/50ML IV SOLN
10.0000 meq | INTRAVENOUS | Status: AC
  Administered 2020-03-15 (×6): 10 meq via INTRAVENOUS
  Filled 2020-03-15 (×6): qty 50

## 2020-03-15 MED ORDER — FUROSEMIDE 10 MG/ML IJ SOLN
20.0000 mg | Freq: Once | INTRAMUSCULAR | Status: DC
  Filled 2020-03-15: qty 2

## 2020-03-15 NOTE — Progress Notes (Signed)
Patient unable to complete Gastric emptying study per radiology. Patient vomited egg used for study. Message left for Dr Martha Clan to notify him of this. Will continue to monitor.

## 2020-03-15 NOTE — Telephone Encounter (Signed)
Message received from [place] on 6E to notify Dr. [name] that pt was unable to do gastric emptying study today d/t vomiting.  Dr. [name] notified.

## 2020-03-15 NOTE — Consult Note (Signed)
Chief Complaint: Patient was seen in consultation today for pancytopenia/bone marrow biopsy and aspiration.  Referring Physician(s): [name]  Supervising Physician: Sandi Mariscal  Patient Status: [facility] - In-pt  History of Present Illness: Vicki Moon is a 69 y.o. female with a past medical history of multiple myeloma, diabetes mellitus type II, and glaucoma. She was unfortunately diagnosed with multiple myeloma in early 2020. Her cancer is managed by Dr. [name]. She was directly admitted to [facility] by her oncologist 03/13/2020 due to dehydration and poor appetite. Inpatient lab work revealed pancytopenia.  IR requested by Dr. [name] for possible image-guided bone marrow biopsy and aspiration to evaluate cause of pancytopenia. Patient awake and alert laying in bed with no complaints at this time. Denies fever, chills, chest pain, dyspnea, abdominal pain, or headache.   Past Medical History:  Diagnosis Date  . Diabetes mellitus without complication (HCC)    Type 2 IDDM x 5 years  . 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 Glaucoma   . Goals of care, counseling/discussion 04/29/2019  . Herniated lumbar intervertebral disc 06/2014  . Multiple myeloma ([place]) 04/29/2019  . PONV (postoperative nausea and vomiting)     Past Surgical History:  Procedure Laterality Date  . CHOLECYSTECTOMY    . COLONOSCOPY N/A 06/28/2015   Procedure: COLONOSCOPY;  Surgeon: Rogene Houston, MD;  Location: [facility];  Service: Endoscopy;  Laterality: N/A;  155  . EYE SURGERY    . IR BONE TUMOR(S)RF ABLATION  04/28/2019  . IR KYPHO LUMBAR INC FX REDUCE BONE BX UNI/BIL CANNULATION INC/IMAGING  04/28/2019  . IR RADIOLOGIST EVAL & MGMT  04/22/2019  . LUMBAR LAMINECTOMY Right 07/12/2014   Procedure: LUMBAR FIVE TO SACRAL ONE MICRODISCECTOMY;  Surgeon: Marybelle Killings, MD;  Location: [place];  Service: Orthopedics;  Laterality: Right;  . TUBAL LIGATION    . VITRECTOMY 25 GAUGE WITH SCLERAL BUCKLE Left  07/29/2017   Procedure: RETINAL DETACHMENT REPAIR LEFT EYE WITH PARSPLANA VITRECTOMY, AIR FLUID EXCHANGE, ENDO LASTER, DRAINAGE OF SUBRETINAL FLUID,ENDOLASER PPV /25 GAUGE;  Surgeon: Jalene Mullet, MD;  Location: [place];  Service: Ophthalmology;  Laterality: Left;    Allergies: Metformin  Medications: Prior to Admission medications   Medication Sig Start Date End Date Taking? Authorizing Provider  ALPRAZolam Duanne Moron) 0.5 MG tablet Take 0.5 mg by mouth at bedtime as needed.  05/10/19  Yes [provider]  glipiZIDE (GLUCOTROL XL) 10 MG 24 hr tablet Take 10 mg by mouth daily.  04/04/19  Yes [provider]  HYDROcodone-acetaminophen (NORCO/VICODIN) 5-325 MG tablet Take 1 tablet by mouth every 6 (six) hours as needed for moderate pain. Received from [facility] ED.   Yes [provider]  ibuprofen (ADVIL) 800 MG tablet Take 800 mg by mouth every 8 (eight) hours as needed. 12/02/19  Yes [provider]  Insulin Disposable Pump (OMNIPOD DASH SYSTEM) KIT 1 Device by Does not apply route 3 (three) times a week. Patient put on insulin pump every 2 to 3 days for insulin delivery   Yes [provider]  latanoprost (XALATAN) 0.005 % ophthalmic solution Place 1 drop into both eyes at bedtime.   Yes [provider]  lisinopril (ZESTRIL) 10 MG tablet Take 10 mg by mouth daily. 10/17/19  Yes [provider]  NOVOLOG 100 UNIT/ML injection Inject 1.2-25 Units into the skin as directed. PATIENT USES INSULIN PUMP  Patient has a set basal rate of 1.2-1.5u per hour with meal time boluses of up  to 25 units 05/06/19  Yes [provider]  ondansetron (ZOFRAN) 8 MG tablet Take 1 tablet (8 mg total) by mouth every 8 (eight) hours as needed for nausea or vomiting. 11/30/19  Yes [name], MD  pantoprazole (PROTONIX) 40 MG tablet Take 1 tablet (40 mg total) by mouth daily. Patient taking differently: Take 40 mg by mouth daily as needed (acid reflux).   02/15/20  Yes [name], MD  PARoxetine (PAXIL) 30 MG tablet Take 30 mg by mouth every morning. 07/12/18  Yes [provider]  [place] dash-insulin pump.Changes every 2 days. 5-25units ac based on blood sugar.   Yes [provider]  prochlorperazine (COMPAZINE) 10 MG tablet Take 1 tablet (10 mg total) by mouth every 6 (six) hours as needed (Nausea or vomiting). 11/23/19  Yes [name], MD  temazepam (RESTORIL) 15 MG capsule Take 1 capsule (15 mg total) by mouth at bedtime as needed for sleep. 01/05/20  Yes Ennever, Rudell Cobb, MD  Continuous Blood Gluc Sensor ([place]) MISC Use one sensor every 10 days. 10/14/17   Cassandria Anger, MD  cyclobenzaprine (FLEXERIL) 5 MG tablet Take 1 tablet (5 mg total) by mouth 3 (three) times daily as needed. Patient not taking: Reported on 02/15/2020 09/03/19   Rolland Porter, MD  diphenoxylate-atropine (LOMOTIL) 2.5-0.025 MG tablet Take 1 tablet by mouth 4 (four) times daily as needed for diarrhea or loose stools. 03/12/20   [name], MD  granisetron (SANCUSO) 3.1 MG/24HR Apply to skin starting 24 hours before chemotherapy. Remove after 7 days. Patient not taking: Reported on 03/13/2020 02/15/20   [name], MD  potassium chloride SA (K-DUR) 20 MEQ tablet Take 2 tablets (40 mEq total) by mouth daily. Patient not taking: Reported on 02/15/2020 07/19/19   [name], MD     Family History  Problem Relation Age of Onset  . Hypertension Mother   . Stroke Mother   . Cancer Father        brain tumor  . Diabetes Father     Social History   Socioeconomic History  . Marital status: Married    Spouse name: Not on file  . Number of children: Not on file  . Years of education: Not on file  . Highest education level: Not on file  Occupational History  . Not on file  Tobacco Use  . Smoking status: Current Some Day Smoker    Packs/day: 1.00    Years: 10.00    Pack years:  10.00    Types: Cigarettes    Last attempt to quit: 02/20/2019    Years since quitting: 1.0  . Smokeless tobacco: Never Used  . Tobacco comment: 1 pack/month   Substance and Sexual Activity  . Alcohol use: No    Alcohol/week: 0.0 standard drinks  . Drug use: No  . Sexual activity: Yes  Other Topics Concern  . Not on file  Social History Narrative  . Not on file   Social Determinants of Health   Financial Resource Strain:   . Difficulty of Paying Living Expenses:   Food Insecurity:   . Worried About Charity fundraiser in the Last Year:   . Arboriculturist in the Last Year:   Transportation Needs:   . Film/video editor (Medical):   Marland Kitchen Lack of Transportation (Non-Medical):   Physical Activity:   . Days of Exercise per Week:   . Minutes of  Exercise per Session:   Stress:   . Feeling of Stress :   Social Connections:   . Frequency of Communication with Friends and Family:   . Frequency of Social Gatherings with Friends and Family:   . Attends Religious Services:   . Active Member of Clubs or Organizations:   . Attends Archivist Meetings:   Marland Kitchen Marital Status:      Review of Systems: A 12 point ROS discussed and pertinent positives are indicated in the HPI above.  All other systems are negative.  Review of Systems  Constitutional: Negative for chills and fever.  Respiratory: Negative for shortness of breath and wheezing.   Cardiovascular: Negative for chest pain and palpitations.  Gastrointestinal: Negative for abdominal pain.  Neurological: Negative for headaches.  Psychiatric/Behavioral: Negative for behavioral problems and confusion.    Vital Signs: BP (!) 150/51 (BP Location: Left Arm)   Pulse 84   Temp 98.6 F (37 C) (Oral)   Resp 20   Ht 5' 2"  (1.575 m)   Wt 156 lb 15.5 oz (71.2 kg)   SpO2 97%   BMI 28.71 kg/m   Physical Exam Vitals and nursing note reviewed.  Constitutional:      General: She is not in acute distress.    Appearance:  Normal appearance.  Cardiovascular:     Rate and Rhythm: Normal rate and regular rhythm.     Heart sounds: Normal heart sounds. No murmur.  Pulmonary:     Effort: Pulmonary effort is normal. No respiratory distress.     Breath sounds: Normal breath sounds. No wheezing.  Skin:    General: Skin is warm and dry.  Neurological:     Mental Status: She is alert and oriented to person, place, and time.      MD Evaluation Airway: WNL Heart: WNL Abdomen: WNL Chest/ Lungs: WNL ASA  Classification: 3 Mallampati/Airway Score: One   Imaging: CT Head W Wo Contrast  Result Date: 03/13/2020 CLINICAL DATA:  69 year old female with multiple myeloma. Intractable vomiting. EXAM: CT HEAD WITHOUT AND WITH CONTRAST TECHNIQUE: Contiguous axial images were obtained from the base of the skull through the vertex without and with intravenous contrast CONTRAST:  72m OMNIPAQUE IOHEXOL 300 MG/ML  SOLN COMPARISON:  Brain MRI 11/03/2019 without contrast and earlier. FINDINGS: Brain: Cerebral volume remains normal for age. No midline shift, ventriculomegaly, mass effect, evidence of mass lesion, intracranial hemorrhage or evidence of cortically based acute infarction. Gray-white matter differentiation is within normal limits throughout the brain. No abnormal enhancement identified. Vascular: The major intracranial vascular structures appear to be enhancing as expected. Skull: Stable occasional small nonspecific lucent areas in the right calvarium since November on series 3, images 47 and 54 today. Minimal similar lucency in the left calvarium. No new or suspicious osseous lesion identified. Sinuses/Orbits: Chronic right sphenoid sinusitis with mucoperiosteal thickening has not significantly changed. Other Visualized paranasal sinuses and mastoids are stable and well pneumatized. Other: No acute orbit or scalp soft tissue finding. IMPRESSION: 1. Several small lucent lesions in the calvarium are stable and remain  indeterminate for myeloma involvement. No new skull lesion. 2. Stable since November and normal for age CT appearance of the brain. 3. Chronic right sphenoid sinusitis. Electronically Signed   By: HGenevie AnnM.D.   On: 03/13/2020 19:13   [place]  Result Date: 03/13/2020 [facility] not [facility], placement could not be confirmed due to current cardiac rhythm.   Labs:  CBC: Recent  Labs    03/13/20 0824 03/13/20 1332 03/14/20 0738 03/15/20 0533  WBC 2.4* 2.6* 2.6* 2.3*  HGB 8.7* 8.2* 7.6* 7.2*  HCT 26.0* 25.4* 23.5* 21.8*  PLT 70* 68* 65* 58*    COAGS: Recent Labs    04/28/19 0840  INR 1.1    BMP: Recent Labs    02/22/20 0916 03/05/20 1125 03/13/20 0824 03/13/20 1259 03/14/20 0738 03/15/20 0533  NA  --  138 138  --  141 141  K  --  3.4* 3.2*  --  3.4* 2.7*  CL  --  107 104  --  113* 118*  CO2  --  22 25  --  22 18*  GLUCOSE  --  162* 228*  --  126* 92  BUN  --  10 10  --  6* <5*  CALCIUM  --  8.5* 8.9  --  8.3* 6.4*  CREATININE   < > 0.98 0.84 0.79 0.73 0.60  GFRNONAA   < > 58* >60 >60 >60 >60  GFRAA   < > >60 >60 >60 >60 >60   < > = values in this interval not displayed.    LIVER FUNCTION TESTS: Recent Labs    03/05/20 1125 03/13/20 0824 03/14/20 0738 03/15/20 0533  BILITOT 0.7 0.6 0.7 0.5  AST 24 15 21  14*  ALT 17 10 12 10   ALKPHOS 73 82 68 52  PROT 7.6 7.6 6.9 5.2*  ALBUMIN 3.1* 3.4* 2.7* 2.0*     Assessment and Plan:  Pancytopenia in the setting of multiple myeloma. Plan for image-guided bone marrow biopsy and aspiration tentatively for tomorrow in IR. Patient will be NPO at midnight. Afebrile. CBC with differential ordered for 0500 tomorrow.  Risks and benefits discussed with the patient including, but not limited to bleeding, infection, damage to adjacent structures or low yield requiring additional tests. All of the patient's questions were answered, patient is agreeable to proceed. Consent signed and in chart.   Thank  you for this interesting consult.  I greatly enjoyed meeting TZIREL LEONOR and look forward to participating in their care.  A copy of this report was sent to the requesting provider on this date.  Electronically Signed: Earley Abide, PA-C 03/15/2020, 8:55 AM   I spent a total of 40 Minutes in face to face in clinical consultation, greater than 50% of which was counseling/coordinating care for pancytopenia/bone marrow biopsy and aspiration.

## 2020-03-15 NOTE — Progress Notes (Signed)
Overall, Vicki Moon is still about the same.  The problem that we have now is that her blood counts are dropping.  I suppose this could potentially be an effect from the Selinexor even though she has not been on this for several weeks.  Her white cell count is 2.3.  Her hemoglobin is 7.2.  Her platelet count is 58,000.  She will definitely need 2 units of packed red blood cells.  We are going to have to get a bone marrow biopsy on her.  I need to see if the myeloma is progressing and causing the pancytopenia or if this is a effect from the [place].  Her potassium is down to 2.7.  She will definitely need to have some IV potassium.  I very much appreciate gastroenterology's help.  They want to hold on doing an endoscopy because of the thrombocytopenia.  She was still not able to eat all that much.  She just taken some liquids.  Her albumin is down to 2.  Her calcium is 6.4.  Corrected, the calcium is up to 8.4.  Her creatinine is 0.6.  She has had no fever.  There is been no bleeding.  She says she still having some diarrhea.  She is hopefully going to go for the gastric emptying scan today.  Her vital signs show temperature of 98.6.  Pulse 84.  Blood pressure 150/51.  Her abdomen is soft.  Bowel sounds are still decreased.  There is no guarding or rebound tenderness.  She has no palpable liver or spleen tip.  Lungs are clear.  Cardiac exam regular rate and rhythm.  Neurological exam is nonfocal.  We still are trying to overcome this vomiting and diarrhea.  This certainly could be multifactorial.  I think a transfusion will help her.  I believe that the potassium infusion will also help.  I do appreciate the outstanding help by gastroenterology.  We still have a long way to go before Vicki Moon is even close to being discharged.  I want to make sure that she is able to actually eat before she goes home.  I very much appreciate the outstanding care that she is getting from all the staff  on [address].  Lattie Haw, MD  [place] 14:14

## 2020-03-15 NOTE — Progress Notes (Signed)
Subjective: Ongoing and unimproved nausea/vomiting. Ongoing and unimproved diarrhea.  Objective: Vital signs in last 24 hours: Temp:  [97.7 F (36.5 C)-98.6 F (37 C)] 98.4 F (36.9 C) (03/25 1141) Pulse Rate:  [80-91] 80 (03/25 1141) Resp:  [15-20] 18 (03/25 1141) BP: (136-150)/(51-94) 136/64 (03/25 1141) SpO2:  [97 %-100 %] 99 % (03/25 1141) Weight:  [71.2 kg] 71.2 kg (03/25 0700) Weight change: 0.2 kg Last BM Date: 03/13/20  PE: GEN:  Nauseated-appearing but NAD  Lab Results: CBC    Component Value Date/Time   WBC 2.3 (L) 03/15/2020 0533   RBC 2.12 (L) 03/15/2020 0533   HGB 7.2 (L) 03/15/2020 0533   HGB 8.7 (L) 03/13/2020 0824   HCT 21.8 (L) 03/15/2020 0533   PLT 58 (L) 03/15/2020 0533   PLT 70 (L) 03/13/2020 0824   MCV 102.8 (H) 03/15/2020 0533   MCH 34.0 03/15/2020 0533   MCHC 33.0 03/15/2020 0533   RDW 16.4 (H) 03/15/2020 0533   LYMPHSABS 0.5 (L) 03/15/2020 0533   MONOABS 0.2 03/15/2020 0533   EOSABS 0.1 03/15/2020 0533   BASOSABS 0.0 03/15/2020 0533   CMP     Component Value Date/Time   NA 141 03/15/2020 0533   K 2.7 (LL) 03/15/2020 0533   CL 118 (H) 03/15/2020 0533   CO2 18 (L) 03/15/2020 0533   GLUCOSE 92 03/15/2020 0533   BUN <5 (L) 03/15/2020 0533   CREATININE 0.60 03/15/2020 0533   CREATININE 0.84 03/13/2020 0824   CREATININE 0.65 01/12/2018 1004   CALCIUM 6.4 (LL) 03/15/2020 0533   PROT 5.2 (L) 03/15/2020 0533   ALBUMIN 2.0 (L) 03/15/2020 0533   AST 14 (L) 03/15/2020 0533   AST 15 03/13/2020 0824   ALT 10 03/15/2020 0533   ALT 10 03/13/2020 0824   ALKPHOS 52 03/15/2020 0533   BILITOT 0.5 03/15/2020 0533   BILITOT 0.6 03/13/2020 0824   GFRNONAA >60 03/15/2020 0533   GFRNONAA >60 03/13/2020 0824   GFRNONAA 91 01/12/2018 1004   GFRAA >60 03/15/2020 0533   GFRAA >60 03/13/2020 0824   GFRAA 106 01/12/2018 1004   Assessment:  1.  Nausea and vomiting.  Patient reports she had essentially no issues with nausea and vomiting until she  received her Selinexor/Velcade chemotherapy for myeloma, yet her nausea/vomiting symptoms have persisted for 3 weeks after stopping her chemotherapy.  Can Selinexor/Velcade cause irreversible gastroparesis, or enhance gastroparesis from someone otherwise prone (diabetes) to this condition? 2.  Weight loss, 30-40 lbs, likely from #1 above. 3.  Multiple myeloma with pancytopenia. 4.  Diarrhea.  This is longstanding issue for multiple years, and for which she has had colonoscopy (unrevealing other than diverticulosis and hyperplastic polyps) and other work-up by Dr. Laural Golden about 5 years ago.  Plan:  1.  Unable to tolerate gastric emptying study; doubt patient would be able to tolerate UGI series for the same reason. 2.  Increase loperamide; add probiotics. 3.  Bone Marrow Biopsy planned for tomorrow. 4.  May consider endoscopy this admission, pending bone marrow biopsy findings and pending improvement of patient's pancytopenia. 5.  Continue supportive care with antiemetics (scheduled and prn). 6.  [facility] will revisit over weekend.   Landry Dyke 03/15/2020, 12:19 PM   Cell [phone] If no answer or after 5 PM call [phone]

## 2020-03-15 NOTE — Progress Notes (Signed)
CRITICAL VALUE ALERT  Critical Value:  Potassium 2.7, Calcium 6.4   Date & Time Notied:  02/16/20 0650  Provider Notified: [name]  Orders Received/Actions taken: blood transfusion, IV potassium.

## 2020-03-16 ENCOUNTER — Inpatient Hospital Stay (HOSPITAL_COMMUNITY): Payer: Medicare Other

## 2020-03-16 HISTORY — PX: IR BONE MARROW BIOPSY & ASPIRATION: IMG5727

## 2020-03-16 LAB — CBC WITH DIFFERENTIAL/PLATELET
Abs Immature Granulocytes: 0.18 10*3/uL — ABNORMAL HIGH (ref 0.00–0.07)
Basophils Absolute: 0 10*3/uL (ref 0.0–0.1)
Basophils Relative: 0 %
Eosinophils Absolute: 0.1 10*3/uL (ref 0.0–0.5)
Eosinophils Relative: 4 %
HCT: 27.6 % — ABNORMAL LOW (ref 36.0–46.0)
Hemoglobin: 9.1 g/dL — ABNORMAL LOW (ref 12.0–15.0)
Immature Granulocytes: 7 %
Lymphocytes Relative: 18 %
Lymphs Abs: 0.5 10*3/uL — ABNORMAL LOW (ref 0.7–4.0)
MCH: 31.8 pg (ref 26.0–34.0)
MCHC: 33 g/dL (ref 30.0–36.0)
MCV: 96.5 fL (ref 80.0–100.0)
Monocytes Absolute: 0.3 10*3/uL (ref 0.1–1.0)
Monocytes Relative: 12 %
Neutro Abs: 1.6 10*3/uL — ABNORMAL LOW (ref 1.7–7.7)
Neutrophils Relative %: 59 %
Platelets: 55 10*3/uL — ABNORMAL LOW (ref 150–400)
RBC: 2.86 MIL/uL — ABNORMAL LOW (ref 3.87–5.11)
RDW: 18.7 % — ABNORMAL HIGH (ref 11.5–15.5)
WBC: 2.6 10*3/uL — ABNORMAL LOW (ref 4.0–10.5)
nRBC: 0 % (ref 0.0–0.2)

## 2020-03-16 LAB — TYPE AND SCREEN
ABO/RH(D): O POS
Antibody Screen: NEGATIVE
Unit division: 0
Unit division: 0

## 2020-03-16 LAB — GLUCOSE, CAPILLARY
Glucose-Capillary: 108 mg/dL — ABNORMAL HIGH (ref 70–99)
Glucose-Capillary: 104 mg/dL — ABNORMAL HIGH (ref 70–99)
Glucose-Capillary: 83 mg/dL (ref 70–99)
Glucose-Capillary: 153 mg/dL — ABNORMAL HIGH (ref 70–99)
Glucose-Capillary: 180 mg/dL — ABNORMAL HIGH (ref 70–99)
Glucose-Capillary: 187 mg/dL — ABNORMAL HIGH (ref 70–99)

## 2020-03-16 LAB — COMPREHENSIVE METABOLIC PANEL
ALT: 13 U/L (ref 0–44)
AST: 17 U/L (ref 15–41)
Albumin: 2.5 g/dL — ABNORMAL LOW (ref 3.5–5.0)
Alkaline Phosphatase: 75 U/L (ref 38–126)
Anion gap: 8 (ref 5–15)
BUN: <5 mg/dL — ABNORMAL LOW (ref 8–23)
CO2: 21 mmol/L — ABNORMAL LOW (ref 22–32)
Calcium: 7.9 mg/dL — ABNORMAL LOW (ref 8.9–10.3)
Chloride: 109 mmol/L (ref 98–111)
Creatinine, Ser: 0.68 mg/dL (ref 0.44–1.00)
GFR calc Af Amer: >60 mL/min (ref >60)
GFR calc non Af Amer: >60 mL/min (ref >60)
Glucose, Bld: 96 mg/dL (ref 70–99)
Potassium: 3.8 mmol/L (ref 3.5–5.1)
Sodium: 138 mmol/L (ref 135–145)
Total Bilirubin: 1 mg/dL (ref 0.3–1.2)
Total Protein: 6.7 g/dL (ref 6.5–8.1)

## 2020-03-16 LAB — MAGNESIUM: Magnesium: 1.5 mg/dL — ABNORMAL LOW (ref 1.7–2.4)

## 2020-03-16 LAB — BPAM RBC
Blood Product Expiration Date: 202104202359
Blood Product Expiration Date: 202104252359
ISSUE DATE / TIME: 202103241206
ISSUE DATE / TIME: 202103251113
Unit Type and Rh: 5100
Unit Type and Rh: 5100

## 2020-03-16 LAB — SARS CORONAVIRUS 2 (TAT 6-24 HRS): SARS Coronavirus 2: NEGATIVE

## 2020-03-16 LAB — HEMOGLOBIN A1C
Hgb A1c MFr Bld: 7.7 % — ABNORMAL HIGH (ref 4.8–5.6)
Mean Plasma Glucose: 174.29 mg/dL

## 2020-03-16 MED ORDER — MIDAZOLAM HCL 2 MG/2ML IJ SOLN
INTRAMUSCULAR | Status: AC
  Filled 2020-03-16: qty 4

## 2020-03-16 MED ORDER — MIDAZOLAM HCL 2 MG/2ML IJ SOLN
INTRAMUSCULAR | Status: AC | PRN
  Administered 2020-03-16 (×4): 1 mg via INTRAVENOUS

## 2020-03-16 MED ORDER — LIDOCAINE HCL 1 % IJ SOLN
INTRAMUSCULAR | Status: AC
  Filled 2020-03-16: qty 20

## 2020-03-16 MED ORDER — BISMUTH SUBSALICYLATE 262 MG/15ML PO SUSP
30.0000 mL | Freq: Three times a day (TID) | ORAL | Status: DC
  Administered 2020-03-17: 30 mL via ORAL
  Filled 2020-03-16: qty 236

## 2020-03-16 MED ORDER — NALOXONE HCL 0.4 MG/ML IJ SOLN
INTRAMUSCULAR | Status: AC
  Filled 2020-03-16: qty 1

## 2020-03-16 MED ORDER — MAGNESIUM SULFATE 2 GM/50ML IV SOLN
2.0000 g | Freq: Once | INTRAVENOUS | Status: AC
  Administered 2020-03-16: 2 g via INTRAVENOUS
  Filled 2020-03-16: qty 50

## 2020-03-16 MED ORDER — FENTANYL CITRATE (PF) 100 MCG/2ML IJ SOLN
INTRAMUSCULAR | Status: AC
  Filled 2020-03-16: qty 2

## 2020-03-16 MED ORDER — FLUMAZENIL 0.5 MG/5ML IV SOLN
INTRAVENOUS | Status: AC
  Filled 2020-03-16: qty 5

## 2020-03-16 MED ORDER — FENTANYL CITRATE (PF) 100 MCG/2ML IJ SOLN
INTRAMUSCULAR | Status: AC | PRN
  Administered 2020-03-16 (×2): 50 ug via INTRAVENOUS

## 2020-03-16 MED ORDER — SODIUM CHLORIDE 0.9 % IV SOLN
150.0000 mg | Freq: Once | INTRAVENOUS | Status: AC
  Administered 2020-03-16: 150 mg via INTRAVENOUS
  Filled 2020-03-16: qty 5

## 2020-03-16 MED ORDER — LIDOCAINE HCL (PF) 1 % IJ SOLN
INTRAMUSCULAR | Status: AC | PRN
  Administered 2020-03-16: 10 mL via INTRADERMAL

## 2020-03-16 NOTE — Progress Notes (Signed)
Overall, Ms. Sauseda says she feels a little bit better.  She did not have any vomiting yesterday.  I think she did have an episode with the gastric emptying study.  After that, she did not have any further vomiting.  She still is having some diarrhea.  Hopefully, she will have her bone marrow biopsy today.  Her blood counts are really about the same.  She did get 2 units of packed red blood cells yesterday.  Her hemoglobin is 9.1.  Her platelet count is 55,000.  Her white cell count is 2.6.  She got the potassium supplementation yesterday.  We will have to see what her potassium level is.  I very much appreciate the help from gastroenterology.  I know that they are trying their best to improve her quality of life.  I will try her on a dose of Emend.  Maybe this could help the vomiting.  I suppose this could still be from the Selinexor even though she has not had a for 3 weeks.  This might be 1 of those late toxicity issues that can happen.  All of her stool pathogens are negative.  Her blood sugars have not been all that bad mostly because she just has not eaten much.  All of her vital signs look pretty stable.  Blood pressure is 130/56.  Her pulse is 76.  Temperature is 98.  Her oral exam shows no thrush.  She has no adenopathy in the neck.  Lungs are clear bilaterally.  Cardiac exam regular rate and rhythm.  Abdomen is soft.  Bowel sounds are still decreased.  There is no fluid wave.  There is no guarding.  Extremities shows no clubbing, cyanosis or edema.  Neurological exam shows no focal deficits.  We still are trying to figure out the etiology of the nausea/vomiting and diarrhea.  Again, this might be from the [facility] and that she has not had it for 3 weeks.  She is pancytopenic.  She goes for her bone marrow biopsy today.  Hopefully, we will not find that the pancytopenia is myeloma progression.  If there is relatively hypoplastic marrow, this also could indicate a Selinexor  effect.  We will continue her on the clear liquids.  We will see how the Emend helps.  We will monitor the rest of her electrolytes.  I know that the staff up on [address]. are doing a fantastic job trying to help [place] out.   Lattie Haw, MD  [place] 13:8

## 2020-03-16 NOTE — Procedures (Signed)
Interventional Radiology Procedure Note  Procedure: CT guided aspirate and core biopsy of right iliac bone Complications: None Recommendations: - Bedrest supine x 1 hrs - Hydrocodone PRN  Pain - Follow biopsy results  Signed,  Aziz Slape K. Missey Hasley, MD   

## 2020-03-17 LAB — CBC WITH DIFFERENTIAL/PLATELET
Abs Immature Granulocytes: 0.17 10*3/uL — ABNORMAL HIGH (ref 0.00–0.07)
Basophils Absolute: 0 10*3/uL (ref 0.0–0.1)
Basophils Relative: 0 %
Eosinophils Absolute: 0.1 10*3/uL (ref 0.0–0.5)
Eosinophils Relative: 3 %
HCT: 27.8 % — ABNORMAL LOW (ref 36.0–46.0)
Hemoglobin: 8.9 g/dL — ABNORMAL LOW (ref 12.0–15.0)
Immature Granulocytes: 6 %
Lymphocytes Relative: 17 %
Lymphs Abs: 0.5 10*3/uL — ABNORMAL LOW (ref 0.7–4.0)
MCH: 31.4 pg (ref 26.0–34.0)
MCHC: 32 g/dL (ref 30.0–36.0)
MCV: 98.2 fL (ref 80.0–100.0)
Monocytes Absolute: 0.3 10*3/uL (ref 0.1–1.0)
Monocytes Relative: 10 %
Neutro Abs: 1.8 10*3/uL (ref 1.7–7.7)
Neutrophils Relative %: 64 %
Platelets: 55 10*3/uL — ABNORMAL LOW (ref 150–400)
RBC: 2.83 MIL/uL — ABNORMAL LOW (ref 3.87–5.11)
RDW: 18.7 % — ABNORMAL HIGH (ref 11.5–15.5)
WBC: 2.8 10*3/uL — ABNORMAL LOW (ref 4.0–10.5)
nRBC: 0.7 % — ABNORMAL HIGH (ref 0.0–0.2)

## 2020-03-17 LAB — COMPREHENSIVE METABOLIC PANEL
ALT: 13 U/L (ref 0–44)
AST: 19 U/L (ref 15–41)
Albumin: 2.4 g/dL — ABNORMAL LOW (ref 3.5–5.0)
Alkaline Phosphatase: 81 U/L (ref 38–126)
Anion gap: 5 (ref 5–15)
BUN: <5 mg/dL — ABNORMAL LOW (ref 8–23)
CO2: 22 mmol/L (ref 22–32)
Calcium: 8.1 mg/dL — ABNORMAL LOW (ref 8.9–10.3)
Chloride: 109 mmol/L (ref 98–111)
Creatinine, Ser: 0.79 mg/dL (ref 0.44–1.00)
GFR calc Af Amer: >60 mL/min (ref >60)
GFR calc non Af Amer: >60 mL/min (ref >60)
Glucose, Bld: 94 mg/dL (ref 70–99)
Potassium: 4 mmol/L (ref 3.5–5.1)
Sodium: 136 mmol/L (ref 135–145)
Total Bilirubin: 0.6 mg/dL (ref 0.3–1.2)
Total Protein: 6.9 g/dL (ref 6.5–8.1)

## 2020-03-17 LAB — GLUCOSE, CAPILLARY
Glucose-Capillary: 106 mg/dL — ABNORMAL HIGH (ref 70–99)
Glucose-Capillary: 116 mg/dL — ABNORMAL HIGH (ref 70–99)
Glucose-Capillary: 156 mg/dL — ABNORMAL HIGH (ref 70–99)
Glucose-Capillary: 84 mg/dL (ref 70–99)
Glucose-Capillary: 88 mg/dL (ref 70–99)
Glucose-Capillary: 95 mg/dL (ref 70–99)

## 2020-03-17 MED ORDER — LOPERAMIDE HCL 2 MG PO CAPS: 4.0000 mg | ORAL_CAPSULE | ORAL | Status: DC | PRN

## 2020-03-17 NOTE — Progress Notes (Signed)
Overall, Vicki Moon is getting a little bit better.  I do think she has had any vomiting for a couple days..  She still having diarrhea.  I am not sure why she is having the diarrhea.  It is not bloody.  She is not taking anything for this.  I did order some Pepto-Bismol for her.  She had the bone marrow biopsy yesterday.  The results by would not be until Tuesday or Wednesday.  She would like to take a shower today.  I do not see a problem with her doing this.  Her blood counts today show white cell count 2.8.  Hemoglobin 8.9.  Platelet count 55,000.  These were all holding stable.  Hopefully, this might be an indicator that treatment with the Selinexor might be starting to "come out of her system."  Her potassium is 4.  Her blood sugar is 194.  Her creatinine 0.79.  All of her vital signs are stable.  Her blood pressure is on the higher side at 160/67.  Temperature 98.1.  Pulse 77.  Her abdominal exam shows her abdomen to be slightly more active.  She has no guarding or rebound tenderness.  There is no fluid wave.  Her cardiac exam is regular rate and rhythm.  Neurological exam is nonfocal.  We will continue with IV fluids.  She will try to taking more liquid today.  If she still has no problems with vomiting, then maybe we can try to advance her diet a little bit.  Hopefully, the Pepto-Bismol will help with the diarrhea.  Lattie Haw, MD  Demetra Shiner

## 2020-03-18 LAB — CBC WITH DIFFERENTIAL/PLATELET
Abs Immature Granulocytes: 0.12 10*3/uL — ABNORMAL HIGH (ref 0.00–0.07)
Basophils Absolute: 0 10*3/uL (ref 0.0–0.1)
Basophils Relative: 1 %
Eosinophils Absolute: 0.1 10*3/uL (ref 0.0–0.5)
Eosinophils Relative: 5 %
HCT: 26 % — ABNORMAL LOW (ref 36.0–46.0)
Hemoglobin: 8.5 g/dL — ABNORMAL LOW (ref 12.0–15.0)
Immature Granulocytes: 5 %
Lymphocytes Relative: 21 %
Lymphs Abs: 0.5 10*3/uL — ABNORMAL LOW (ref 0.7–4.0)
MCH: 32.6 pg (ref 26.0–34.0)
MCHC: 32.7 g/dL (ref 30.0–36.0)
MCV: 99.6 fL (ref 80.0–100.0)
Monocytes Absolute: 0.3 10*3/uL (ref 0.1–1.0)
Monocytes Relative: 11 %
Neutro Abs: 1.4 10*3/uL — ABNORMAL LOW (ref 1.7–7.7)
Neutrophils Relative %: 57 %
Platelets: 48 10*3/uL — ABNORMAL LOW (ref 150–400)
RBC: 2.61 MIL/uL — ABNORMAL LOW (ref 3.87–5.11)
RDW: 18.3 % — ABNORMAL HIGH (ref 11.5–15.5)
WBC: 2.4 10*3/uL — ABNORMAL LOW (ref 4.0–10.5)
nRBC: 0 % (ref 0.0–0.2)

## 2020-03-18 LAB — GLUCOSE, CAPILLARY
Glucose-Capillary: 110 mg/dL — ABNORMAL HIGH (ref 70–99)
Glucose-Capillary: 80 mg/dL (ref 70–99)
Glucose-Capillary: 84 mg/dL (ref 70–99)
Glucose-Capillary: 85 mg/dL (ref 70–99)
Glucose-Capillary: 85 mg/dL (ref 70–99)
Glucose-Capillary: 90 mg/dL (ref 70–99)
Glucose-Capillary: 95 mg/dL (ref 70–99)

## 2020-03-18 LAB — COMPREHENSIVE METABOLIC PANEL
ALT: 11 U/L (ref 0–44)
AST: 16 U/L (ref 15–41)
Albumin: 2.4 g/dL — ABNORMAL LOW (ref 3.5–5.0)
Alkaline Phosphatase: 73 U/L (ref 38–126)
Anion gap: 6 (ref 5–15)
BUN: <5 mg/dL — ABNORMAL LOW (ref 8–23)
CO2: 21 mmol/L — ABNORMAL LOW (ref 22–32)
Calcium: 7.9 mg/dL — ABNORMAL LOW (ref 8.9–10.3)
Chloride: 115 mmol/L — ABNORMAL HIGH (ref 98–111)
Creatinine, Ser: 0.71 mg/dL (ref 0.44–1.00)
GFR calc Af Amer: >60 mL/min (ref >60)
GFR calc non Af Amer: >60 mL/min (ref >60)
Glucose, Bld: 86 mg/dL (ref 70–99)
Potassium: 5.2 mmol/L — ABNORMAL HIGH (ref 3.5–5.1)
Sodium: 142 mmol/L (ref 135–145)
Total Bilirubin: 0.7 mg/dL (ref 0.3–1.2)
Total Protein: 6.4 g/dL — ABNORMAL LOW (ref 6.5–8.1)

## 2020-03-18 MED ORDER — EPOETIN ALFA 20000 UNIT/ML IJ SOLN
40000.0000 [IU] | INTRAMUSCULAR | Status: DC
  Administered 2020-03-18: 40000 [IU] via SUBCUTANEOUS
  Filled 2020-03-18: qty 2
  Filled 2020-03-18: qty 1

## 2020-03-18 MED ORDER — ACETAMINOPHEN 325 MG PO TABS
650.0000 mg | ORAL_TABLET | Freq: Four times a day (QID) | ORAL | Status: DC | PRN
  Administered 2020-03-18 – 2020-03-22 (×3): 650 mg via ORAL
  Filled 2020-03-18 (×3): qty 2

## 2020-03-18 MED ORDER — SODIUM CHLORIDE 0.9 % IV SOLN
INTRAVENOUS | Status: DC
  Administered 2020-03-18: 1000 mL via INTRAVENOUS

## 2020-03-18 MED ORDER — SODIUM CHLORIDE 0.9 % IV SOLN
8.0000 mg | Freq: Four times a day (QID) | INTRAVENOUS | Status: DC | PRN
  Administered 2020-03-18 – 2020-03-21 (×2): 8 mg via INTRAVENOUS
  Filled 2020-03-18 (×3): qty 4

## 2020-03-18 NOTE — Progress Notes (Signed)
Thankfully, Vicki Moon has had no diarrhea.  She has had no vomiting.  We will try to advance her diet to full liquids now.  Her potassium is up to 5.2.  We will now to get the potassium out of the IV fluids.  We will decrease her IV fluid down to 50 cc an hour.  She is having some bony discomfort.  This certainly could be from the myeloma.  We will have her on some Tylenol for this.  Her blood counts are still dropping somewhat.  Her white cell count is 2.4.  Platelet count 48,000.  Hemoglobin 8.5.  Her blood sugars are actually doing quite well.  Again she really is not eating all that much.  I will try her on a dose of Procrit to see if this can help her hemoglobin a little bit.  She seems to be a little more active.  She says she does feel better.  The bone marrow result will clearly be critical.  She has had no bleeding.  She has had no cough or shortness of breath.  There is been no fever.  Her vital signs all look stable.  Her blood pressure is 148/60.  Her temperature is 98.2.  Pulse is 78.  Her lungs are clear.  Oral exam shows no mucositis.  There is no thrush in the mouth.  Cardiac exam regular rate and rhythm.  Abdomen is soft.  Bowel sounds are still decreased.  There is no guarding or rebound tenderness.  Extremities shows no clubbing, cyanosis or edema.  Skin exam shows no rashes.  Neurological exam is nonfocal.  Hopefully, we are now making some progress with [name].  She has had no vomiting for 2 or 3 days.  The diarrhea has stopped.  We now have to try to advance her diet and see how she does with this.  Again, the staff on [address] are doing a fantastic job with her.  Lattie Haw, MD  [place] 12:13

## 2020-03-18 NOTE — Progress Notes (Signed)
Chart reviewed. Since she is getting better I don't know if there is any more role for GI. Let us know if we can be of further help.

## 2020-03-19 LAB — COMPREHENSIVE METABOLIC PANEL
ALT: 12 U/L (ref 0–44)
AST: 18 U/L (ref 15–41)
Albumin: 2.5 g/dL — ABNORMAL LOW (ref 3.5–5.0)
Alkaline Phosphatase: 77 U/L (ref 38–126)
Anion gap: 9 (ref 5–15)
BUN: 5 mg/dL — ABNORMAL LOW (ref 8–23)
CO2: 21 mmol/L — ABNORMAL LOW (ref 22–32)
Calcium: 8.4 mg/dL — ABNORMAL LOW (ref 8.9–10.3)
Chloride: 108 mmol/L (ref 98–111)
Creatinine, Ser: 0.77 mg/dL (ref 0.44–1.00)
GFR calc Af Amer: >60 mL/min (ref >60)
GFR calc non Af Amer: >60 mL/min (ref >60)
Glucose, Bld: 81 mg/dL (ref 70–99)
Potassium: 3.8 mmol/L (ref 3.5–5.1)
Sodium: 138 mmol/L (ref 135–145)
Total Bilirubin: 0.7 mg/dL (ref 0.3–1.2)
Total Protein: 6.6 g/dL (ref 6.5–8.1)

## 2020-03-19 LAB — CBC WITH DIFFERENTIAL/PLATELET
Abs Immature Granulocytes: 0.15 10*3/uL — ABNORMAL HIGH (ref 0.00–0.07)
Basophils Absolute: 0 10*3/uL (ref 0.0–0.1)
Basophils Relative: 0 %
Eosinophils Absolute: 0.1 10*3/uL (ref 0.0–0.5)
Eosinophils Relative: 4 %
HCT: 26.7 % — ABNORMAL LOW (ref 36.0–46.0)
Hemoglobin: 8.7 g/dL — ABNORMAL LOW (ref 12.0–15.0)
Immature Granulocytes: 6 %
Lymphocytes Relative: 20 %
Lymphs Abs: 0.5 10*3/uL — ABNORMAL LOW (ref 0.7–4.0)
MCH: 31.8 pg (ref 26.0–34.0)
MCHC: 32.6 g/dL (ref 30.0–36.0)
MCV: 97.4 fL (ref 80.0–100.0)
Monocytes Absolute: 0.3 10*3/uL (ref 0.1–1.0)
Monocytes Relative: 11 %
Neutro Abs: 1.4 10*3/uL — ABNORMAL LOW (ref 1.7–7.7)
Neutrophils Relative %: 59 %
Platelets: 48 10*3/uL — ABNORMAL LOW (ref 150–400)
RBC: 2.74 MIL/uL — ABNORMAL LOW (ref 3.87–5.11)
RDW: 18.1 % — ABNORMAL HIGH (ref 11.5–15.5)
WBC: 2.4 10*3/uL — ABNORMAL LOW (ref 4.0–10.5)
nRBC: 0.8 % — ABNORMAL HIGH (ref 0.0–0.2)

## 2020-03-19 LAB — GLUCOSE, CAPILLARY
Glucose-Capillary: 110 mg/dL — ABNORMAL HIGH (ref 70–99)
Glucose-Capillary: 115 mg/dL — ABNORMAL HIGH (ref 70–99)
Glucose-Capillary: 154 mg/dL — ABNORMAL HIGH (ref 70–99)
Glucose-Capillary: 68 mg/dL — ABNORMAL LOW (ref 70–99)
Glucose-Capillary: 78 mg/dL (ref 70–99)
Glucose-Capillary: 85 mg/dL (ref 70–99)
Glucose-Capillary: 86 mg/dL (ref 70–99)

## 2020-03-19 MED ORDER — BISMUTH SUBSALICYLATE 262 MG/15ML PO SUSP: 30.0000 mL | ORAL | Status: DC | PRN

## 2020-03-19 NOTE — Progress Notes (Signed)
Thankfully, Vicki Moon is making progress.  She is had no diarrhea.  She has had no vomiting.  She tolerated full liquids.  We will now see about moving her up to a regular diet.  Her CBC is still is quite low.  Her hemoglobin is 8.7.  White cell count 2.4.  Platelet count 48,000.  Her BUN is 5 creatinine 0.77.  Her albumin is 2.5.  Calcium 8.4.  Her blood sugar is 81.  We will hopefully have some preliminary bone marrow report back today.  She is a little more active.  I think she is getting up into a chair.  She has had no fever.  She has had no rashes.  I may consider Neupogen if her white cell count drops any further.  She did get a dose of Procrit yesterday.  Her vital signs are temperature 98.5.  Pulse 72.  Blood pressure 137/56.  Her head neck exam shows no ocular or oral lesions.  She has no thrush.  She has no adenopathy in the neck.  Lungs are clear.  Cardiac exam regular rate and rhythm.  Abdomen is soft.  Bowel sounds are still decreased.  There is no guarding or rebound tenderness.  Extremities shows no clubbing, cyanosis or edema.  Thankfully, she is now eating better.  She is taking more nutrition.  Hopefully she will tolerate a regular diet.  We just have to watch out with her blood counts.  Hopefully the bone marrow report will be out today.  I think if all goes well, then we might be able to get her out by Thursday.  It would be wonderful to make sure that she is home for [facility] Sunday.  I very much appreciate the outstanding care that she is getting from all the staff upon 6 E.   Lattie Haw, MD  [facility] 19:46

## 2020-03-19 NOTE — Progress Notes (Signed)
Inpatient Diabetes Program Recommendations  AACE/ADA: New Consensus Statement on Inpatient Glycemic Control (2015)  Target Ranges:  Prepandial:   less than 140 mg/dL      Peak postprandial:   less than 180 mg/dL (1-2 hours)      Critically ill patients:  140 - 180 mg/dL   Lab Results  Component Value Date   GLUCAP 85 03/19/2020   HGBA1C 7.7 (H) 03/16/2020    Review of Glycemic Control  Current orders for Inpatient glycemic control: Lantus 24 units QHS,   Hypoglycemia again this am - 68 mg/dL.  Inpatient Diabetes Program Recommendations:     Decrease Lantus to 20 units QHS.  Continue to follow closely.  Thank you. Lorenda Peck, RD, LDN, CDE Inpatient Diabetes Coordinator [phone]

## 2020-03-19 NOTE — Progress Notes (Signed)
Hypoglycemic Event  CBG: 68  Treatment: 8 oz of juice  Symptoms: none  Follow-up CBG: Time: 0801 CBG Result:85  Possible Reasons for Event: unknown  Comments/MD notified: attending MD paged    Lucy Chris

## 2020-03-19 NOTE — Care Management Important Message (Signed)
Important Message  Patient Details IM Letter given to [name] RN Case Manager to present to the Patient Name: Vicki Moon MRN: [id number] Date of Birth: 02/12/1951   Medicare Important Message Given:  Yes     Kerin Salen 03/19/2020, 10:22 AM

## 2020-03-20 LAB — CBC WITH DIFFERENTIAL/PLATELET
Abs Immature Granulocytes: 0.2 10*3/uL — ABNORMAL HIGH (ref 0.00–0.07)
Basophils Absolute: 0 10*3/uL (ref 0.0–0.1)
Basophils Relative: 0 %
Eosinophils Absolute: 0.1 10*3/uL (ref 0.0–0.5)
Eosinophils Relative: 2 %
HCT: 26.4 % — ABNORMAL LOW (ref 36.0–46.0)
Hemoglobin: 8.7 g/dL — ABNORMAL LOW (ref 12.0–15.0)
Immature Granulocytes: 9 %
Lymphocytes Relative: 22 %
Lymphs Abs: 0.5 10*3/uL — ABNORMAL LOW (ref 0.7–4.0)
MCH: 32.2 pg (ref 26.0–34.0)
MCHC: 33 g/dL (ref 30.0–36.0)
MCV: 97.8 fL (ref 80.0–100.0)
Monocytes Absolute: 0.3 10*3/uL (ref 0.1–1.0)
Monocytes Relative: 13 %
Neutro Abs: 1.2 10*3/uL — ABNORMAL LOW (ref 1.7–7.7)
Neutrophils Relative %: 54 %
Platelets: 47 10*3/uL — ABNORMAL LOW (ref 150–400)
RBC: 2.7 MIL/uL — ABNORMAL LOW (ref 3.87–5.11)
RDW: 17.8 % — ABNORMAL HIGH (ref 11.5–15.5)
WBC: 2.3 10*3/uL — ABNORMAL LOW (ref 4.0–10.5)
nRBC: 0 % (ref 0.0–0.2)

## 2020-03-20 LAB — COMPREHENSIVE METABOLIC PANEL
ALT: 11 U/L (ref 0–44)
AST: 16 U/L (ref 15–41)
Albumin: 2.3 g/dL — ABNORMAL LOW (ref 3.5–5.0)
Alkaline Phosphatase: 79 U/L (ref 38–126)
Anion gap: 7 (ref 5–15)
BUN: <5 mg/dL — ABNORMAL LOW (ref 8–23)
CO2: 23 mmol/L (ref 22–32)
Calcium: 8.2 mg/dL — ABNORMAL LOW (ref 8.9–10.3)
Chloride: 108 mmol/L (ref 98–111)
Creatinine, Ser: 0.62 mg/dL (ref 0.44–1.00)
GFR calc Af Amer: >60 mL/min (ref >60)
GFR calc non Af Amer: >60 mL/min (ref >60)
Glucose, Bld: 93 mg/dL (ref 70–99)
Potassium: 3.4 mmol/L — ABNORMAL LOW (ref 3.5–5.1)
Sodium: 138 mmol/L (ref 135–145)
Total Bilirubin: 0.6 mg/dL (ref 0.3–1.2)
Total Protein: 6.6 g/dL (ref 6.5–8.1)

## 2020-03-20 LAB — SURGICAL PATHOLOGY

## 2020-03-20 LAB — GLUCOSE, CAPILLARY
Glucose-Capillary: 112 mg/dL — ABNORMAL HIGH (ref 70–99)
Glucose-Capillary: 77 mg/dL (ref 70–99)
Glucose-Capillary: 119 mg/dL — ABNORMAL HIGH (ref 70–99)
Glucose-Capillary: 130 mg/dL — ABNORMAL HIGH (ref 70–99)
Glucose-Capillary: 133 mg/dL — ABNORMAL HIGH (ref 70–99)

## 2020-03-20 MED ORDER — SODIUM CHLORIDE 0.9% FLUSH
10.0000 mL | Freq: Two times a day (BID) | INTRAVENOUS | Status: DC
  Administered 2020-03-22 (×2): 10 mL

## 2020-03-20 MED ORDER — TBO-FILGRASTIM 480 MCG/0.8ML ~~LOC~~ SOSY
480.0000 ug | PREFILLED_SYRINGE | Freq: Once | SUBCUTANEOUS | Status: AC
  Administered 2020-03-20: 480 ug via SUBCUTANEOUS
  Filled 2020-03-20: qty 0.8

## 2020-03-20 MED ORDER — SODIUM CHLORIDE 0.9% FLUSH: 10.0000 mL | INTRAVENOUS | Status: DC | PRN

## 2020-03-20 NOTE — Progress Notes (Signed)
Thankfully, Vicki Moon is eating better.  She has some solid food yesterday.  There is no vomiting.  There is no diarrhea.  She still does not have the bone marrow report back yet.  Hopefully this will be back today.  Her white cell count continues to trend downward.  Her white count is 2.3.  Hemoglobin 8.7.  Platelet count 47,000.  I will try a dose of Neupogen to see if we cannot get the white cell count up a little bit better.  She is had no pain.  She has had no fever.  There has been no bleeding.  She has been out of bed a little bit more.  On her metabolic studies, her sodium is 138.  Potassium 3.4.  Her blood sugar is 93.  Creatinine 0.60.  Her albumin is 2.3.  Her liver function studies were okay.  On her physical exam, her temperature is 97.9.  Pulse 70.  Blood pressure 163/68.  Oral exam shows moist oromucosa.  She has no oral thrush.  There is no scleral icterus.  She has no adenopathy in her neck.  Lungs are clear bilaterally.  Cardiac exam regular rate and rhythm.  Abdomen is soft.  Bowel sounds are slightly decreased.  There is no guarding or rebound tenderness.  Extremities shows no clubbing, cyanosis or edema.  Hopefully, we will have the bone marrow report out today.  I will continue to decrease her IV fluids.  Maybe, if all looks good today, we can let her go home tomorrow.  Lattie Haw, MD  Psalm 40:11

## 2020-03-21 ENCOUNTER — Other Ambulatory Visit: Payer: Medicare Other

## 2020-03-21 ENCOUNTER — Ambulatory Visit: Payer: Medicare Other

## 2020-03-21 LAB — CBC WITH DIFFERENTIAL/PLATELET
Abs Immature Granulocytes: 0.13 10*3/uL — ABNORMAL HIGH (ref 0.00–0.07)
Basophils Absolute: 0 10*3/uL (ref 0.0–0.1)
Basophils Relative: 0 %
Eosinophils Absolute: 0.1 10*3/uL (ref 0.0–0.5)
Eosinophils Relative: 2 %
HCT: 26.5 % — ABNORMAL LOW (ref 36.0–46.0)
Hemoglobin: 8.6 g/dL — ABNORMAL LOW (ref 12.0–15.0)
Immature Granulocytes: 2 %
Lymphocytes Relative: 10 %
Lymphs Abs: 0.6 10*3/uL — ABNORMAL LOW (ref 0.7–4.0)
MCH: 32.1 pg (ref 26.0–34.0)
MCHC: 32.5 g/dL (ref 30.0–36.0)
MCV: 98.9 fL (ref 80.0–100.0)
Monocytes Absolute: 0.6 10*3/uL (ref 0.1–1.0)
Monocytes Relative: 9 %
Neutro Abs: 5 10*3/uL (ref 1.7–7.7)
Neutrophils Relative %: 77 %
Platelets: 43 10*3/uL — ABNORMAL LOW (ref 150–400)
RBC: 2.68 MIL/uL — ABNORMAL LOW (ref 3.87–5.11)
RDW: 17.9 % — ABNORMAL HIGH (ref 11.5–15.5)
WBC: 6.3 10*3/uL (ref 4.0–10.5)
nRBC: 0.5 % — ABNORMAL HIGH (ref 0.0–0.2)

## 2020-03-21 LAB — GLUCOSE, CAPILLARY
Glucose-Capillary: 110 mg/dL — ABNORMAL HIGH (ref 70–99)
Glucose-Capillary: 110 mg/dL — ABNORMAL HIGH (ref 70–99)
Glucose-Capillary: 115 mg/dL — ABNORMAL HIGH (ref 70–99)
Glucose-Capillary: 58 mg/dL — ABNORMAL LOW (ref 70–99)
Glucose-Capillary: 63 mg/dL — ABNORMAL LOW (ref 70–99)
Glucose-Capillary: 67 mg/dL — ABNORMAL LOW (ref 70–99)
Glucose-Capillary: 73 mg/dL (ref 70–99)
Glucose-Capillary: 75 mg/dL (ref 70–99)
Glucose-Capillary: 79 mg/dL (ref 70–99)

## 2020-03-21 LAB — COMPREHENSIVE METABOLIC PANEL WITH GFR
ALT: 13 U/L (ref 0–44)
AST: 24 U/L (ref 15–41)
Albumin: 2.4 g/dL — ABNORMAL LOW (ref 3.5–5.0)
Alkaline Phosphatase: 82 U/L (ref 38–126)
Anion gap: 9 (ref 5–15)
BUN: <5 mg/dL — ABNORMAL LOW (ref 8–23)
CO2: 21 mmol/L — ABNORMAL LOW (ref 22–32)
Calcium: 8.4 mg/dL — ABNORMAL LOW (ref 8.9–10.3)
Chloride: 109 mmol/L (ref 98–111)
Creatinine, Ser: 0.71 mg/dL (ref 0.44–1.00)
GFR calc Af Amer: >60 mL/min
GFR calc non Af Amer: >60 mL/min
Glucose, Bld: 97 mg/dL (ref 70–99)
Potassium: 3.2 mmol/L — ABNORMAL LOW (ref 3.5–5.1)
Sodium: 139 mmol/L (ref 135–145)
Total Bilirubin: 0.4 mg/dL (ref 0.3–1.2)
Total Protein: 6.6 g/dL (ref 6.5–8.1)

## 2020-03-21 MED ORDER — ENSURE ENLIVE PO LIQD
237.0000 mL | Freq: Two times a day (BID) | ORAL | Status: DC
  Administered 2020-03-21: 237 mL via ORAL

## 2020-03-21 NOTE — Progress Notes (Signed)
Inpatient Diabetes Program Recommendations  AACE/ADA: New Consensus Statement on Inpatient Glycemic Control (2015)  Target Ranges:  Prepandial:   less than 140 mg/dL      Peak postprandial:   less than 180 mg/dL (1-2 hours)      Critically ill patients:  140 - 180 mg/dL   Lab Results  Component Value Date   GLUCAP 79 03/21/2020   HGBA1C 7.7 (H) 03/16/2020    Review of Glycemic Control Results for AMADA, MONTE (MRN [id number]) as of 03/21/2020 09:16  Ref. Range 03/20/2020 20:40 03/21/2020 00:11 03/21/2020 00:35 03/21/2020 04:13 03/21/2020 04:42 03/21/2020 05:08 03/21/2020 07:16  Glucose-Capillary Latest Ref Range: 70 - 99 mg/dL 133 (H) 58 (L) 73 67 (L) 63 (L) 75 79    Pt on insulin pump (omnipod) at home Current orders for Inpatient glycemic control: Lantus 24 units QHS, Novolog 0-9 units Q4 hours  Hypoglycemia again this am - 58 overnight and 63 mg/dL this am.  Inpatient Diabetes Program Recommendations:     Decrease Lantus to 18 units QHS.  If po intake is good change Novolog to 0-9 units tid + Novolog hs scale that starts at 201.  Thanks,  Tama Headings RN, MSN, BC-ADM Inpatient Diabetes Coordinator Team Pager [phone] (8a-5p)

## 2020-03-21 NOTE — Progress Notes (Signed)
Nutrition Follow-up  DOCUMENTATION CODES:   Not applicable  INTERVENTION: D/c Boost Breeze Ensure Enlive po BID, each supplement provides 350 kcal and 20 grams of protein  Recommend monitoring magnesium, potassium, and phosphorus daily for at least 3 days, MD to replete as needed, as pt is at risk for refeeding syndrome given history of ongoing n/v/d and poor po intake. Symptoms have improved and patient is tolerating diet, low K noted per labs.    NUTRITION DIAGNOSIS:   Inadequate oral intake related to cancer and cancer related treatments as evidenced by percent weight loss, per patient/family report(unable to tolerate po intake due to persistent nausea and vomiting over the past 3 weeks).   GOAL:   Patient will meet greater than or equal to 90% of their needs    MONITOR:   Weight trends, Labs, Supplement acceptance, PO intake, Diet advancement  REASON FOR ASSESSMENT:   Malnutrition Screening Tool    ASSESSMENT:   RD working remotely.  69 year old female with past medical history of IDDM2, erythropoietin deficiency anemia, herniated lumbar intervertebral disc, chronic diarrhea and multiple myeloma currently undergoing chemotherapy (last treatment with Selinexor/Velcade started ~ 1 month ago) presented with ongoing nausea/vomiting since starting chemotherapy and reports 30-40 lb wt loss.  3/23 PICC 3/26 IR bone marrow biopsy and aspiration  Per chart review, Gastric Emptying Study unable to be completed on 3/25, pt did not tolerate and vomited egg used for study. Patient without any episodes of vomiting or diarrhea over the past 4 days. Her diet was advanced to regular on 3/26 and she has been eating 100% of meals per flowsheets. Patient is provided Boost Breeze TID, noted refusal of supplement this morning. Will discontinue Boost Breeze supplement and provide Ensure twice daily. Pt is on a regular diet and Ensure offers more calories/protein verses clear liquid supplement.    Patient reports not feeling well this morning and had episode of hypoglycemia this morning. Patient given some apple sauce and orange juice that caused some dyspepsia.   Given patient with recent improvements to po intake s/p limited oral intake secondary to vomiting/diarrhea she is likely refeeding, noted K 3.2 per 3/31 labs. Recommend monitoring K/Mg/P daily over the next few days, MD to replete at needed.   Per notes: -biopsy results revealed extensive marrow involvement  -improving WBC s/p 1 dose of Neupogen -Venetoclax indicated, likely start at half dose d/t previous poor medication tolerance.  Medications reviewed and include: SSI, Lantus, Paxil IVF: NaCl IVPB: Zofran   Labs: CBGs 79,75,63,67,73,58,133 x 24 hrs, K 3.2 (L) trending down, BUN <5(L)   Diet Order:   Diet Order            Diet regular Room service appropriate? Yes; Fluid consistency: Thin  Diet effective now              EDUCATION NEEDS:   No education needs have been identified at this time  Skin:  Skin Assessment: Reviewed RN Assessment  Last BM:  3/29  Height:   Ht Readings from Last 1 Encounters:  03/13/20 5' 2"  (1.575 m)    Weight:   Wt Readings from Last 1 Encounters:  03/15/20 71.2 kg    Ideal Body Weight:  50 kg  BMI:  Body mass index is 28.71 kg/m.  Estimated Nutritional Needs:   Kcal:  2100-2300  Protein:  105-120  Fluid:  >/= 2.1 L/day   Lajuan Lines, RD, LDN Clinical Nutrition After Hours/Weekend Pager # in [place]

## 2020-03-21 NOTE — Progress Notes (Signed)
Vicki Moon is not feeling as well this morning.  She has had no vomiting or diarrhea.  She apparently has some hypoglycemia this morning.  She has some apple juice and orange juice.  This caused a little bit of dyspepsia.  We got the bone marrow biopsy results back.  Unfortunately, it is what I expected.  She has extensive marrow involvement by myeloma.  I am sure this is why she has the cytopenia.  Her white cell count has gotten better just with 1 dose of Neupogen.  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  She just has had a very poor tolerance to a lot of treatments.  We really do not have a lot of options left.  Her labs today show white cell count 6.3.  Hemoglobin 8.6.  Platelet count 43,000.  Her sodium 139.  Potassium 3.2.  BUN is less than 5 and creatinine 0.71.  Her albumin is 2.4 with a calcium of 8.4.  Her liver function studies look good.  She is out of bed a little bit.  Her vital signs show temperature 98.2.  Pulse 67.  Blood pressure 142/70.  Her head neck exam shows no ocular or oral lesions.  She has no palpable cervical or supraclavicular lymph nodes.  Lungs are clear.  Cardiac exam regular rate and rhythm with no murmurs, rubs or bruits.  Abdomen is soft.  Bowel sounds are still decreased.  There is no guarding or rebound tenderness.  Extremities shows no clubbing, cyanosis or edema.  We will keep Ms. Lanum today.  We will see how she eats.  Hopefully, she will be able to eat okay.  I will have to work on getting venetoclax.  Full dose is 400 mg daily.  Given her past difficulties with medication, we probably will have to start her off at 200 mg daily.  I very much appreciate all the great help that we have been given by the staff up on 6 E.  The staff is doing a fantastic job with Ms. Galano.  Lattie Haw, MD  [address] [place] 3:4

## 2020-03-22 LAB — GLUCOSE, CAPILLARY
Glucose-Capillary: 148 mg/dL — ABNORMAL HIGH (ref 70–99)
Glucose-Capillary: 91 mg/dL (ref 70–99)
Glucose-Capillary: 96 mg/dL (ref 70–99)
Glucose-Capillary: 134 mg/dL — ABNORMAL HIGH (ref 70–99)
Glucose-Capillary: 153 mg/dL — ABNORMAL HIGH (ref 70–99)
Glucose-Capillary: 134 mg/dL — ABNORMAL HIGH (ref 70–99)

## 2020-03-22 LAB — CBC WITH DIFFERENTIAL/PLATELET
Abs Immature Granulocytes: 0.6 10*3/uL — ABNORMAL HIGH (ref 0.00–0.07)
Basophils Absolute: 0 10*3/uL (ref 0.0–0.1)
Basophils Relative: 0 %
Eosinophils Absolute: 0.1 10*3/uL (ref 0.0–0.5)
Eosinophils Relative: 2 %
HCT: 26.7 % — ABNORMAL LOW (ref 36.0–46.0)
Hemoglobin: 8.7 g/dL — ABNORMAL LOW (ref 12.0–15.0)
Immature Granulocytes: 8 %
Lymphocytes Relative: 12 %
Lymphs Abs: 0.9 10*3/uL (ref 0.7–4.0)
MCH: 32.2 pg (ref 26.0–34.0)
MCHC: 32.6 g/dL (ref 30.0–36.0)
MCV: 98.9 fL (ref 80.0–100.0)
Monocytes Absolute: 0.6 10*3/uL (ref 0.1–1.0)
Monocytes Relative: 9 %
Neutro Abs: 5.2 10*3/uL (ref 1.7–7.7)
Neutrophils Relative %: 69 %
Platelets: 44 10*3/uL — ABNORMAL LOW (ref 150–400)
RBC: 2.7 MIL/uL — ABNORMAL LOW (ref 3.87–5.11)
RDW: 18.1 % — ABNORMAL HIGH (ref 11.5–15.5)
WBC: 7.5 10*3/uL (ref 4.0–10.5)
nRBC: 0.5 % — ABNORMAL HIGH (ref 0.0–0.2)

## 2020-03-22 LAB — COMPREHENSIVE METABOLIC PANEL
ALT: 11 U/L (ref 0–44)
AST: 18 U/L (ref 15–41)
Albumin: 2.5 g/dL — ABNORMAL LOW (ref 3.5–5.0)
Alkaline Phosphatase: 89 U/L (ref 38–126)
Anion gap: 7 (ref 5–15)
BUN: <5 mg/dL — ABNORMAL LOW (ref 8–23)
CO2: 23 mmol/L (ref 22–32)
Calcium: 8.6 mg/dL — ABNORMAL LOW (ref 8.9–10.3)
Chloride: 108 mmol/L (ref 98–111)
Creatinine, Ser: 0.76 mg/dL (ref 0.44–1.00)
GFR calc Af Amer: >60 mL/min (ref >60)
GFR calc non Af Amer: >60 mL/min (ref >60)
Glucose, Bld: 93 mg/dL (ref 70–99)
Potassium: 3.3 mmol/L — ABNORMAL LOW (ref 3.5–5.1)
Sodium: 138 mmol/L (ref 135–145)
Total Bilirubin: 0.7 mg/dL (ref 0.3–1.2)
Total Protein: 6.8 g/dL (ref 6.5–8.1)

## 2020-03-22 LAB — PREPARE RBC (CROSSMATCH)

## 2020-03-22 MED ORDER — TRAMADOL HCL 50 MG PO TABS
50.0000 mg | ORAL_TABLET | Freq: Four times a day (QID) | ORAL | Status: DC | PRN
  Administered 2020-03-22: 50 mg via ORAL
  Filled 2020-03-22: qty 1

## 2020-03-22 MED ORDER — DENOSUMAB 120 MG/1.7ML ~~LOC~~ SOLN: 120.0000 mg | Freq: Once | SUBCUTANEOUS | Status: DC

## 2020-03-22 MED ORDER — ZOLEDRONIC ACID 4 MG/5ML IV CONC
4.0000 mg | Freq: Once | INTRAVENOUS | Status: AC
  Administered 2020-03-22: 4 mg via INTRAVENOUS
  Filled 2020-03-22: qty 5

## 2020-03-22 MED ORDER — FUROSEMIDE 10 MG/ML IJ SOLN
20.0000 mg | Freq: Once | INTRAMUSCULAR | Status: AC
  Administered 2020-03-22: 20 mg via INTRAVENOUS
  Filled 2020-03-22: qty 2

## 2020-03-22 MED ORDER — FUROSEMIDE 10 MG/ML IJ SOLN: 20.0000 mg | Freq: Once | INTRAMUSCULAR | Status: DC

## 2020-03-22 MED ORDER — SODIUM CHLORIDE 0.9% IV SOLUTION: Freq: Once | INTRAVENOUS | Status: AC

## 2020-03-22 NOTE — Care Management Important Message (Signed)
Important Message  Patient Details IM Letter given to [name] RN Case Manager to present to the Patient Name: Vicki Moon MRN: [id number] Date of Birth: 1951/07/19   Medicare Important Message Given:  Yes     Kerin Salen 03/22/2020, 10:40 AM

## 2020-03-22 NOTE — Progress Notes (Signed)
Vicki Moon does feel a little more weak today.  She does not have as much stamina.  I have to believe that this is from the anemia.  Even though she is not that anemic, I feel that given the fact that her bone marrow is full of myeloma, she is going to keep dropping her hemoglobin.  I really think that she is going to benefit from a transfusion if we are going to try to get her home before the weekend.  I talked to her about this.  She is agreeable.  She still seems to be doing okay with eating.  She is having no vomiting.  There is no diarrhea.  I do appreciate nutritional management helping [place] out and seeing her.  I know that this is going to help her out quite a bit.  Her albumin is 2.5.  Her calcium is 8.6.  I think that we probably should go ahead and give her a dose of Xgeva for the bony pain that she has.  Her platelet count is 44,000.  She said that she had a nosebleed yesterday.  I think a transfusion with packed red blood cells will help with this.  She has had no fever.  She had no cough or shortness of breath.  Her vital signs all look pretty stable.  Her blood pressure is 133/53.  Her lungs are clear.  Oral exam shows no mucositis or thrush.  Cardiac exam regular rate and rhythm.  Abdomen is soft.  Bowel sounds are still somewhat decreased.  There is no guarding or rebound tenderness.  Extremities shows no clubbing, cyanosis or edema.  Skin exam shows no rashes.  Neurological exam shows no focal deficits.  We will go ahead and do the transfusion today.  I think this will help her out quite a bit before we get her home.  I will plan on let her go home tomorrow.  I very much appreciate everybody's help on 6 E.  Lattie Haw, MD  [name] 19:10

## 2020-03-23 ENCOUNTER — Other Ambulatory Visit: Payer: Self-pay | Admitting: Hematology & Oncology

## 2020-03-23 ENCOUNTER — Telehealth: Payer: Self-pay | Admitting: Hematology & Oncology

## 2020-03-23 ENCOUNTER — Telehealth: Payer: Self-pay

## 2020-03-23 DIAGNOSIS — C9000 Multiple myeloma not having achieved remission: Secondary | ICD-10-CM

## 2020-03-23 LAB — CBC WITH DIFFERENTIAL/PLATELET
Abs Immature Granulocytes: 0.42 10*3/uL — ABNORMAL HIGH (ref 0.00–0.07)
Basophils Absolute: 0.1 10*3/uL (ref 0.0–0.1)
Basophils Relative: 1 %
Eosinophils Absolute: 0.1 10*3/uL (ref 0.0–0.5)
Eosinophils Relative: 2 %
HCT: 34.8 % — ABNORMAL LOW (ref 36.0–46.0)
Hemoglobin: 11.8 g/dL — ABNORMAL LOW (ref 12.0–15.0)
Immature Granulocytes: 7 %
Lymphocytes Relative: 12 %
Lymphs Abs: 0.8 10*3/uL (ref 0.7–4.0)
MCH: 31.3 pg (ref 26.0–34.0)
MCHC: 33.9 g/dL (ref 30.0–36.0)
MCV: 92.3 fL (ref 80.0–100.0)
Monocytes Absolute: 0.6 10*3/uL (ref 0.1–1.0)
Monocytes Relative: 9 %
Neutro Abs: 4.6 10*3/uL (ref 1.7–7.7)
Neutrophils Relative %: 69 %
Platelets: 41 10*3/uL — ABNORMAL LOW (ref 150–400)
RBC: 3.77 MIL/uL — ABNORMAL LOW (ref 3.87–5.11)
RDW: 19.5 % — ABNORMAL HIGH (ref 11.5–15.5)
WBC: 6.5 10*3/uL (ref 4.0–10.5)
nRBC: 0.5 % — ABNORMAL HIGH (ref 0.0–0.2)

## 2020-03-23 LAB — TYPE AND SCREEN
ABO/RH(D): O POS
Antibody Screen: NEGATIVE
Unit division: 0
Unit division: 0

## 2020-03-23 LAB — GLUCOSE, CAPILLARY
Glucose-Capillary: 107 mg/dL — ABNORMAL HIGH (ref 70–99)
Glucose-Capillary: 121 mg/dL — ABNORMAL HIGH (ref 70–99)
Glucose-Capillary: 127 mg/dL — ABNORMAL HIGH (ref 70–99)

## 2020-03-23 LAB — COMPREHENSIVE METABOLIC PANEL
ALT: 13 U/L (ref 0–44)
AST: 20 U/L (ref 15–41)
Albumin: 2.6 g/dL — ABNORMAL LOW (ref 3.5–5.0)
Alkaline Phosphatase: 92 U/L (ref 38–126)
Anion gap: 10 (ref 5–15)
BUN: 5 mg/dL — ABNORMAL LOW (ref 8–23)
CO2: 24 mmol/L (ref 22–32)
Calcium: 8.5 mg/dL — ABNORMAL LOW (ref 8.9–10.3)
Chloride: 104 mmol/L (ref 98–111)
Creatinine, Ser: 0.71 mg/dL (ref 0.44–1.00)
GFR calc Af Amer: >60 mL/min (ref >60)
GFR calc non Af Amer: >60 mL/min (ref >60)
Glucose, Bld: 117 mg/dL — ABNORMAL HIGH (ref 70–99)
Potassium: 3.2 mmol/L — ABNORMAL LOW (ref 3.5–5.1)
Sodium: 138 mmol/L (ref 135–145)
Total Bilirubin: 0.9 mg/dL (ref 0.3–1.2)
Total Protein: 7.1 g/dL (ref 6.5–8.1)

## 2020-03-23 LAB — BPAM RBC
Blood Product Expiration Date: 202104282359
Blood Product Expiration Date: 202104302359
ISSUE DATE / TIME: 202104011336
ISSUE DATE / TIME: 202104011649
Unit Type and Rh: 5100
Unit Type and Rh: 5100

## 2020-03-23 MED ORDER — VENETOCLAX 100 MG PO TABS: 200.0000 mg | ORAL_TABLET | Freq: Every day | ORAL | 4 refills | Status: DC

## 2020-03-23 MED ORDER — SACCHAROMYCES BOULARDII 250 MG PO CAPS: 250.0000 mg | ORAL_CAPSULE | Freq: Two times a day (BID) | ORAL | Status: DC

## 2020-03-23 NOTE — Progress Notes (Signed)
Pt discharged home in stable condition. Discharge instructions given. No immediate concerns at this time. Discharged from unit via wheelchair.

## 2020-03-23 NOTE — Discharge Summary (Signed)
This is a discharge summary for [employer].  Her date of admission was 3/23/ 2021. Her date of discharge is 4 /2 /2021.  Her diagnosis upon discharge is:  #1. Nausea and vomiting-dehydration 2.. Refractory multiple myeloma 3. Diarrhea 4. Pancytopenia secondary to myeloma 5. Hypokalemia 6. Insulin-dependent diabetes  Her medications on discharge;  Xanax 0.5 mg p.o. nightly as needed Glucotrol XL 10 mg p.o. daily Insulin subcutaneous pump Lisinopril 10 mg p.o. daily Protonix 40 mg p.o. daily Paxil 30 mg p.o. daily Restoril 15 mg p.o. nightly as needed Xalatan eyedrops 0.005% 1 drop both eyes at bedtime Zofran 8 mg p.o. every 8 hours as needed  Hospital course:  Ms. Cloer was admitted from the office on March 23rd 2021. She has myeloma that has been very difficult to treat. She was recently placed on Selinexor. She had a couple weeks of this. She tolerated this very poorly. She had persistent nausea and vomiting and diarrhea. She was quite dehydrated.  She cannot really go home. She was subsequently admitted. She was started on IV fluids. She had a PICC line placed as she had very poor IV access.  She has started on normal saline with supplemental potassium. Her potassium I think dropped down to 2.7. She had his potassium runs.  She was seen by gastroenterology. They helped out quite a bit. They did not feel that she needed any type of invasive procedure because of her thrombocytopenia.  She has stool cultures done for diarrhea. Her stool cultures were all negative. There is no evidence of C. difficile.  She was transfused as she was pancytopenic. We ultimately had to do a bone marrow biopsy on her to assess for the myeloma. Bone marrow biopsy was done on 326 2021. The pathology report [id number]) showed extensive marrow replacement by myeloma. I think this clearly showed [place] why she had the pancytopenia.  Thankfully, her nausea and vomiting and diarrhea resolved. We  advanced her diet. She initially was on clear liquids. She subsequently was advanced to a regular diet.  Her blood sugars were in really not much of a problem as she was not eating all that much.  She was able to be ambulatory will bit more. She is able to get out of bed. She sat in a chair.  We did have to transfuse her with 2 units of blood on the day prior to discharge. Her hemoglobin was 8.7. I felt that with the myelomatous replacement of her bone marrow, that she really would not be able to have much of an erythropoiesis and a transfusion would certainly help her when she went home.  On the day of discharge, her labs showed a sodium of 138. Potassium 3.2. Her BUN was 5 creatinine 0.71. Her blood sugar was 117. Her calcium was 8.5 with an albumin of 2.6. Her white cell count was 6.5. Her hemoglobin was 11.8. Platelet count 41,000.  Her physical exam upon discharge shows a temperature of 98. Pulse 75. Blood pressure is 153/57. Her head neck exam shows no ocular or oral lesions. There is no thrush. She has no adenopathy in the neck. There is no palpable thyroid. Lungs are clear bilaterally. Cardiac exam regular rate and rhythm with no murmurs, rubs or bruits. Abdomen is soft. She has decreased bowel sounds. There is no fluid wave. There is no guarding or rebound tenderness. There is no palpable liver or spleen tip. Extremities shows no clubbing, cyanosis or edema. Neurological exam shows no focal neurological deficits. Skin  exam shows some scattered ecchymoses.  This is a discharge summary for [employer].  Lattie Haw, MD  [name] 19:10

## 2020-03-23 NOTE — Telephone Encounter (Signed)
Oral Oncology Patient Advocate Encounter  Received notification from [facility] that prior authorization for Venclexta is required.  PA submitted on CoverMyMeds Key [id number] Status is pending  Oral Oncology Clinic will continue to follow.  [place] Patient [place] Phone [phone] Fax [phone] 03/23/2020 3:14 PM

## 2020-03-23 NOTE — Telephone Encounter (Signed)
Called and LMVM for patient regarding appointments added per Staff message from 4/2 Dr [name]

## 2020-03-24 LAB — IGG, IGA, IGM
IgA: 15 mg/dL — ABNORMAL LOW (ref 64–422)
IgG (Immunoglobin G), Serum: 3056 mg/dL — ABNORMAL HIGH (ref 586–1602)
IgM (Immunoglobulin M), Srm: 26 mg/dL (ref 26–217)

## 2020-03-26 LAB — KAPPA/LAMBDA LIGHT CHAINS
Kappa free light chain: 398.6 mg/L — ABNORMAL HIGH (ref 3.3–19.4)
Kappa, lambda light chain ratio: 62.28 — ABNORMAL HIGH (ref 0.26–1.65)
Lambda free light chains: 6.4 mg/L (ref 5.7–26.3)

## 2020-03-26 LAB — PROTEIN ELECTROPHORESIS, SERUM
A/G Ratio: 0.7 (ref 0.7–1.7)
Albumin ELP: 2.9 g/dL (ref 2.9–4.4)
Alpha-1-Globulin: 0.3 g/dL (ref 0.0–0.4)
Alpha-2-Globulin: 0.9 g/dL (ref 0.4–1.0)
Beta Globulin: 0.7 g/dL (ref 0.7–1.3)
Gamma Globulin: 2.2 g/dL — ABNORMAL HIGH (ref 0.4–1.8)
Globulin, Total: 4.1 g/dL — ABNORMAL HIGH (ref 2.2–3.9)
M-Spike, %: 2 g/dL — ABNORMAL HIGH
Total Protein ELP: 7 g/dL (ref 6.0–8.5)

## 2020-03-26 NOTE — Telephone Encounter (Signed)
Oral Oncology Patient Advocate Encounter  Prior Authorization for Vicki Moon has been approved.    PA# [id number] Effective dates: 03/23/2020 - until further notice  Patients copay is $0.00  Oral Oncology Clinic will continue to follow.   [place] Patient [place] Phone [phone] Fax [phone] 03/26/2020 8:36 AM

## 2020-03-27 ENCOUNTER — Encounter (HOSPITAL_COMMUNITY): Payer: Self-pay | Admitting: Hematology & Oncology

## 2020-03-27 ENCOUNTER — Telehealth: Payer: Self-pay | Admitting: Pharmacist

## 2020-03-27 NOTE — Telephone Encounter (Signed)
Oral Chemotherapy Pharmacist Encounter  [facility] will deliver medication to the patient on 03/29/20. MD would like for her to get started when she receives her medication.  Patient Education I spoke with patient for overview of new oral chemotherapy medication:  Venclexta (venetoclax) for the treatment of multiple myeloma, planned duration until disease progression or unacceptable drug toxicity. 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  Pt is doing well. Counseled patient on administration, dosing, side effects, monitoring, drug-food interactions, safe handling, storage, and disposal. Patient will take 200 mg by mouth daily.  Side effects include but not limited to: N/V, increased risk of infection, fatigue.    Reviewed with patient importance of keeping a medication schedule and plan for any missed doses.  Vicki Moon voiced understanding and appreciation. All questions answered.  Provided patient with Oral [place] [facility] phone number. Patient knows to call the office with questions or concerns. Oral Chemotherapy Navigation Clinic will continue to follow.  Darl Pikes, PharmD, BCPS, BCOP, CPP Hematology/Oncology Clinical Pharmacist [facility]/AP Oral [place] [facility] [phone]  03/27/2020 4:22 PM

## 2020-03-27 NOTE — Telephone Encounter (Signed)
Oral Oncology Pharmacist Encounter  Received new prescription for Venclexta (venetoclax) for the off-label treatment of multiple myeloma, planned duration until disease progression or unacceptable drug toxicity. 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 Patients with this mutation have been identified as a subgroup of multiple myeloma patients in the [facility] study that may have enhanced benefit from venetoclax treatment. Of note, the FDA stop clinical of venetoclax in multiple myeloma patient based on a pre-planned safety analysis that showed a higher rate of death in the venetoclax group. The increase in mortality was thought to be mostly related to an increased risk of infections. It will be important to monitor Ms. Strojny for infections while on venetoclax treatment.  CBC/CMP from 03/23/20 assessed, no relevant lab abnormalities. Prescription dose and frequency assessed.   Current medication list in Epic reviewed, no relevant DDIs with venetoclax identified.  Prescription has been e-scribed to the [facility] for benefits analysis and approval.  Oral Oncology Clinic will continue to follow for insurance authorization, copayment issues, initial counseling and start date.  Darl Pikes, PharmD, BCPS, BCOP, CPP Hematology/Oncology Clinical Pharmacist [facility]/AP Oral [place] [facility] [phone]  03/27/2020 10:03 AM

## 2020-03-28 ENCOUNTER — Ambulatory Visit: Payer: Medicare Other

## 2020-03-28 ENCOUNTER — Other Ambulatory Visit: Payer: Medicare Other

## 2020-03-28 MED FILL — VENCLEXTA 100 MG TABS: 100 | 30 days supply | Qty: 60 | Fill #0

## 2020-04-02 ENCOUNTER — Telehealth: Payer: Self-pay | Admitting: Hematology & Oncology

## 2020-04-02 ENCOUNTER — Telehealth: Payer: Self-pay | Admitting: *Deleted

## 2020-04-02 ENCOUNTER — Ambulatory Visit: Payer: Medicare Other

## 2020-04-02 ENCOUNTER — Other Ambulatory Visit: Payer: Self-pay | Admitting: Family

## 2020-04-02 ENCOUNTER — Inpatient Hospital Stay: Payer: Medicare Other

## 2020-04-02 ENCOUNTER — Inpatient Hospital Stay (HOSPITAL_BASED_OUTPATIENT_CLINIC_OR_DEPARTMENT_OTHER): Payer: Medicare Other | Admitting: Hematology & Oncology

## 2020-04-02 ENCOUNTER — Other Ambulatory Visit: Payer: Self-pay | Admitting: *Deleted

## 2020-04-02 ENCOUNTER — Encounter: Payer: Self-pay | Admitting: Hematology & Oncology

## 2020-04-02 ENCOUNTER — Other Ambulatory Visit: Payer: Self-pay

## 2020-04-02 ENCOUNTER — Inpatient Hospital Stay: Payer: Medicare Other | Attending: Hematology & Oncology

## 2020-04-02 VITALS — BP 129/76 | HR 92 | Temp 97.1°F | Resp 18 | Wt 156.0 lb

## 2020-04-02 VITALS — BP 123/47 | HR 89 | Temp 97.4°F | Resp 17

## 2020-04-02 DIAGNOSIS — C9000 Multiple myeloma not having achieved remission: Secondary | ICD-10-CM

## 2020-04-02 DIAGNOSIS — R112 Nausea with vomiting, unspecified: Secondary | ICD-10-CM | POA: Insufficient documentation

## 2020-04-02 DIAGNOSIS — Z79899 Other long term (current) drug therapy: Secondary | ICD-10-CM | POA: Insufficient documentation

## 2020-04-02 DIAGNOSIS — D696 Thrombocytopenia, unspecified: Secondary | ICD-10-CM

## 2020-04-02 DIAGNOSIS — Z794 Long term (current) use of insulin: Secondary | ICD-10-CM | POA: Diagnosis not present

## 2020-04-02 DIAGNOSIS — Z9641 Presence of insulin pump (external) (internal): Secondary | ICD-10-CM | POA: Insufficient documentation

## 2020-04-02 DIAGNOSIS — C9002 Multiple myeloma in relapse: Secondary | ICD-10-CM

## 2020-04-02 LAB — CBC WITH DIFFERENTIAL (CANCER CENTER ONLY)
Abs Immature Granulocytes: 0.02 10*3/uL (ref 0.00–0.07)
Basophils Absolute: 0 10*3/uL (ref 0.0–0.1)
Basophils Relative: 1 %
Eosinophils Absolute: 0 10*3/uL (ref 0.0–0.5)
Eosinophils Relative: 2 %
HCT: 31.7 % — ABNORMAL LOW (ref 36.0–46.0)
Hemoglobin: 10.7 g/dL — ABNORMAL LOW (ref 12.0–15.0)
Immature Granulocytes: 1 %
Lymphocytes Relative: 33 %
Lymphs Abs: 0.5 10*3/uL — ABNORMAL LOW (ref 0.7–4.0)
MCH: 30.7 pg (ref 26.0–34.0)
MCHC: 33.8 g/dL (ref 30.0–36.0)
MCV: 90.8 fL (ref 80.0–100.0)
Monocytes Absolute: 0.1 10*3/uL (ref 0.1–1.0)
Monocytes Relative: 9 %
Neutro Abs: 0.7 10*3/uL — ABNORMAL LOW (ref 1.7–7.7)
Neutrophils Relative %: 54 %
Platelet Count: 17 10*3/uL — ABNORMAL LOW (ref 150–400)
RBC: 3.49 MIL/uL — ABNORMAL LOW (ref 3.87–5.11)
RDW: 17.2 % — ABNORMAL HIGH (ref 11.5–15.5)
WBC Count: 1.4 10*3/uL — ABNORMAL LOW (ref 4.0–10.5)
nRBC: 0 % (ref 0.0–0.2)

## 2020-04-02 LAB — CMP (CANCER CENTER ONLY)
ALT: 28 U/L (ref 0–44)
AST: 41 U/L (ref 15–41)
Albumin: 3.1 g/dL — ABNORMAL LOW (ref 3.5–5.0)
Alkaline Phosphatase: 105 U/L (ref 38–126)
Anion gap: 8 (ref 5–15)
BUN: 19 mg/dL (ref 8–23)
CO2: 23 mmol/L (ref 22–32)
Calcium: 8.5 mg/dL — ABNORMAL LOW (ref 8.9–10.3)
Chloride: 106 mmol/L (ref 98–111)
Creatinine: 0.71 mg/dL (ref 0.44–1.00)
GFR, Est AFR Am: >60 mL/min (ref >60)
GFR, Estimated: >60 mL/min (ref >60)
Glucose, Bld: 210 mg/dL — ABNORMAL HIGH (ref 70–99)
Potassium: 3.7 mmol/L (ref 3.5–5.1)
Sodium: 137 mmol/L (ref 135–145)
Total Bilirubin: 0.6 mg/dL (ref 0.3–1.2)
Total Protein: 7.2 g/dL (ref 6.5–8.1)

## 2020-04-02 LAB — SAMPLE TO BLOOD BANK

## 2020-04-02 LAB — MAGNESIUM: Magnesium: 1.8 mg/dL (ref 1.7–2.4)

## 2020-04-02 LAB — LACTATE DEHYDROGENASE: LDH: 748 U/L — ABNORMAL HIGH (ref 98–192)

## 2020-04-02 MED ORDER — SODIUM CHLORIDE 0.9 % IV SOLN
Freq: Once | INTRAVENOUS | Status: AC
  Filled 2020-04-02: qty 250

## 2020-04-02 MED ORDER — PEGFILGRASTIM-CBQV 6 MG/0.6ML ~~LOC~~ SOSY
6.0000 mg | PREFILLED_SYRINGE | Freq: Once | SUBCUTANEOUS | Status: AC
  Administered 2020-04-02: 6 mg via SUBCUTANEOUS

## 2020-04-02 MED ORDER — PEGFILGRASTIM-CBQV 6 MG/0.6ML ~~LOC~~ SOSY
PREFILLED_SYRINGE | SUBCUTANEOUS | Status: AC
  Filled 2020-04-02: qty 0.6

## 2020-04-02 MED ORDER — TBO-FILGRASTIM 480 MCG/0.8ML ~~LOC~~ SOSY: 480.0000 ug | PREFILLED_SYRINGE | Freq: Once | SUBCUTANEOUS | Status: DC

## 2020-04-02 NOTE — Patient Instructions (Signed)

## 2020-04-02 NOTE — Progress Notes (Signed)
Udenyca added per V.O. Dr. [name]. PA okay per Otilio Carpen, Financial Advocate.

## 2020-04-02 NOTE — Progress Notes (Signed)
Hematology and Oncology Follow Up Visit  Vicki Moon [id number] 1951/07/26 69 y.o. 04/02/2020   Principle Diagnosis:  IgG Kappa myeloma -- 1p-, 13q-, 16q- and t(11:14)  Past Therapy: RVD -- start on 05/09/2019 -- d/c revlimid on 06/14/2019 -- d/c on 06/28/2019 S/P Kyphoplasty at L2 XRT to lumbar spine -- completed in [facility] -- startedon 07/12/2019- s/p cycle4 -- d/c on 10/19/2019 due to disease progression  Current Therapy:   Selinexor/Velcade -- start cycle #1 on 02/06/2020 - d/c on 03/03 Kyprolis/Faspro -- start on 10/26/2019 -- s/p cycle #4 -- d/c due to progression Xgeva 120 mg sq q 3 months -- next dose on06/2021 Venetoclax 200 mg po q day -- started on 03/30/2020   Interim History:  Ms. Bubel is here today for follow-up.  We last saw her, we had to admit her because she was in such rough shape.  She was dehydrated.  She was having nausea and vomiting and diarrhea.  I suspect this probably was from the Selinexor that she was taking.  While in the hospital, we actually did do another bone marrow biopsy on her.  This was done on 03/16/2020.  The pathology report [id number]) showed extensive marrow involvement by myeloma.  She had 90% involvement by her myeloma.  I thought that her best chance of probably only chance now would be to try her on venetoclax.  She does have the t(11:14) translocation that venetoclax works.  I am trying her on half dose of venetoclax at 200 mg a day.  She started this 3 days ago.  She actually looks better.  She had a nice Easter weekend.  She started to eat better.  She does feel stronger.  Her white cell count is 1.4.  Platelet count is 17.  I think we will have to give her a platelet transfusion.  I am sure this is from the myeloma.  I will also give her a dose of Neulasta.  In the hospital, we did do myeloma studies on her.  This showed a M spike of 2 g/dL.  Her IgG level was 3056 mg/dL.  Her kappa light chain was 40  mg/dL.  Currently, I would have to say that her performance status is ECOG 2.   Medications:  Allergies as of 04/02/2020      Reactions   Metformin Diarrhea, Nausea Only      Medication List       Accurate as of April 02, 2020 12:05 PM. If you have any questions, ask your nurse or doctor.        ALPRAZolam 0.5 MG tablet Commonly known as: XANAX Take 0.5 mg by mouth at bedtime as needed.   FreeStyle [employer] Use one sensor every 10 days.   glipiZIDE 10 MG 24 hr tablet Commonly known as: GLUCOTROL XL Take 10 mg by mouth daily.   HYDROcodone-acetaminophen 5-325 MG tablet Commonly known as: NORCO/VICODIN Take 1 tablet by mouth every 6 (six) hours as needed for moderate pain. Received from [facility] ED.   latanoprost 0.005 % ophthalmic solution Commonly known as: XALATAN Place 1 drop into both eyes at bedtime.   lisinopril 10 MG tablet Commonly known as: ZESTRIL Take 10 mg by mouth daily.   NovoLOG 100 UNIT/ML injection Generic drug: insulin aspart Inject 1.2-25 Units into the skin as directed. PATIENT USES INSULIN PUMP  Patient has a set basal rate of 1.2-1.5u per hour with meal time boluses of up to 25 units  OmniPod Dash System Kit 1 Device by Does not apply route 3 (three) times a week. Patient put on insulin pump every 2 to 3 days for insulin delivery   ondansetron 8 MG tablet Commonly known as: ZOFRAN Take 1 tablet (8 mg total) by mouth every 8 (eight) hours as needed for nausea or vomiting.   pantoprazole 40 MG tablet Commonly known as: Protonix Take 1 tablet (40 mg total) by mouth daily. What changed:   when to take this  reasons to take this   PARoxetine 30 MG tablet Commonly known as: PAXIL Take 30 mg by mouth every morning.   PRESCRIPTION MEDICATION Onmipod dash-insulin pump.Changes every 2 days. 5-25units ac based on blood sugar.   prochlorperazine 10 MG tablet Commonly known as: COMPAZINE Take 1 tablet (10 mg total) by  mouth every 6 (six) hours as needed (Nausea or vomiting).   saccharomyces boulardii 250 MG capsule Commonly known as: FLORASTOR Take 1 capsule (250 mg total) by mouth 2 (two) times daily.   temazepam 15 MG capsule Commonly known as: RESTORIL Take 1 capsule (15 mg total) by mouth at bedtime as needed for sleep.   venetoclax 100 MG Tabs Take 200 mg by mouth daily.       Allergies:  Allergies  Allergen Reactions  . Metformin Diarrhea and Nausea Only    Past Medical History, Surgical history, Social history, and Family History were reviewed and updated.  Review of Systems: Review of Systems  Constitutional: Negative.   HENT: Negative.   Eyes: Negative.   Respiratory: Negative.   Cardiovascular: Negative.   Gastrointestinal: Negative.   Genitourinary: Negative.   Musculoskeletal: Negative.   Skin: Negative.   Neurological: Negative.   Endo/Heme/Allergies: Negative.   Psychiatric/Behavioral: Negative.      Physical Exam:  weight is 156 lb (70.8 kg). Her temporal temperature is 97.1 F (36.2 C) (abnormal). Her blood pressure is 129/76 and her pulse is 92. Her respiration is 18 and oxygen saturation is 98%.   Wt Readings from Last 3 Encounters:  04/02/20 156 lb (70.8 kg)  03/15/20 156 lb 15.5 oz (71.2 kg)  03/13/20 158 lb 1.9 oz (71.7 kg)    Physical Exam Vitals reviewed.  HENT:     Head: Normocephalic and atraumatic.  Eyes:     Pupils: Pupils are equal, round, and reactive to light.  Cardiovascular:     Rate and Rhythm: Normal rate and regular rhythm.     Heart sounds: Normal heart sounds.  Pulmonary:     Effort: Pulmonary effort is normal.     Breath sounds: Normal breath sounds.  Abdominal:     General: Bowel sounds are normal.     Palpations: Abdomen is soft.  Musculoskeletal:        General: No tenderness or deformity. Normal range of motion.     Cervical back: Normal range of motion.  Lymphadenopathy:     Cervical: No cervical adenopathy.  Skin:     General: Skin is warm and dry.     Findings: No erythema or rash.  Neurological:     Mental Status: She is alert and oriented to person, place, and time.  Psychiatric:        Behavior: Behavior normal.        Thought Content: Thought content normal.        Judgment: Judgment normal.      Lab Results  Component Value Date   WBC 1.4 (L) 04/02/2020   HGB 10.7 (L) 04/02/2020  HCT 31.7 (L) 04/02/2020   MCV 90.8 04/02/2020   PLT 17 (L) 04/02/2020   Lab Results  Component Value Date   FERRITIN 1,339 (H) 03/13/2020   IRON 101 03/13/2020   TIBC 194 (L) 03/13/2020   UIBC 92 (L) 03/13/2020   IRONPCTSAT 52 03/13/2020   Lab Results  Component Value Date   RETICCTPCT 1.2 02/15/2020   RBC 3.49 (L) 04/02/2020   Lab Results  Component Value Date   KPAFRELGTCHN 398.6 (H) 03/23/2020   LAMBDASER 6.4 03/23/2020   KAPLAMBRATIO 62.28 (H) 03/23/2020   Lab Results  Component Value Date   IGGSERUM 3,056 (H) 03/23/2020   IGA 15 (L) 03/23/2020   IGMSERUM 26 03/23/2020   Lab Results  Component Value Date   TOTALPROTELP 7.0 03/23/2020   ALBUMINELP 2.9 03/23/2020   A1GS 0.3 03/23/2020   A2GS 0.9 03/23/2020   BETS 0.7 03/23/2020   GAMS 2.2 (H) 03/23/2020   MSPIKE 2.0 (H) 03/23/2020   SPEI Comment 03/23/2020     Chemistry      Component Value Date/Time   NA 137 04/02/2020 0758   K 3.7 04/02/2020 0758   CL 106 04/02/2020 0758   CO2 23 04/02/2020 0758   BUN 19 04/02/2020 0758   CREATININE 0.71 04/02/2020 0758   CREATININE 0.65 01/12/2018 1004      Component Value Date/Time   CALCIUM 8.5 (L) 04/02/2020 0758   ALKPHOS 105 04/02/2020 0758   AST 41 04/02/2020 0758   ALT 28 04/02/2020 0758   BILITOT 0.6 04/02/2020 0758       Impression and Plan: Ms. Rawlinson is a very pleasant 69 yo caucasian female with IgG kappa myeloma, with what I would consider to have high risk cytogenetics.  Hopefully, the venetoclax will help her.  I do still see many of the option that we have to  treat her given her performance status.  We will have to do weekly lab work.  We will try to get this done close to home for her.  I would like to see her back in 3 weeks.  Hopefully, she will be doing even better.  I know that the clinical studies have shown that venetoclax does work quite well with myeloma patients who have the t(11:14) translocation.     [name], MD 4/12/202112:05 PM

## 2020-04-02 NOTE — Telephone Encounter (Signed)
Appointments scheduled calendar printed per 4/12 los °

## 2020-04-02 NOTE — Telephone Encounter (Signed)
Called and advised patient of appointments added per 4/12 los

## 2020-04-02 NOTE — Telephone Encounter (Signed)
Dr. [name] notified of platelets-17, wbc-1.4 and anc-0.7. Order received for pt to receive one unit of platelets today and neulasta per Dr. [name].

## 2020-04-03 LAB — BPAM PLATELET PHERESIS
Blood Product Expiration Date: 202104132359
ISSUE DATE / TIME: 202104120936
Unit Type and Rh: 5100

## 2020-04-03 LAB — KAPPA/LAMBDA LIGHT CHAINS
Kappa free light chain: 156.7 mg/L — ABNORMAL HIGH (ref 3.3–19.4)
Kappa, lambda light chain ratio: 19.59 — ABNORMAL HIGH (ref 0.26–1.65)
Lambda free light chains: 8 mg/L (ref 5.7–26.3)

## 2020-04-03 LAB — PREPARE PLATELET PHERESIS: Unit division: 0

## 2020-04-03 LAB — IGG, IGA, IGM
IgA: 12 mg/dL — ABNORMAL LOW (ref 64–422)
IgG (Immunoglobin G), Serum: 2969 mg/dL — ABNORMAL HIGH (ref 586–1602)
IgM (Immunoglobulin M), Srm: 23 mg/dL — ABNORMAL LOW (ref 26–217)

## 2020-04-09 ENCOUNTER — Inpatient Hospital Stay (HOSPITAL_COMMUNITY): Payer: Medicare Other | Attending: Hematology

## 2020-04-09 ENCOUNTER — Other Ambulatory Visit: Payer: Self-pay

## 2020-04-09 DIAGNOSIS — C9000 Multiple myeloma not having achieved remission: Secondary | ICD-10-CM

## 2020-04-09 DIAGNOSIS — C9002 Multiple myeloma in relapse: Secondary | ICD-10-CM

## 2020-04-09 DIAGNOSIS — D696 Thrombocytopenia, unspecified: Secondary | ICD-10-CM

## 2020-04-09 LAB — COMPREHENSIVE METABOLIC PANEL
ALT: 21 U/L (ref 0–44)
AST: 20 U/L (ref 15–41)
Albumin: 2.9 g/dL — ABNORMAL LOW (ref 3.5–5.0)
Alkaline Phosphatase: 153 U/L — ABNORMAL HIGH (ref 38–126)
Anion gap: 8 (ref 5–15)
BUN: 12 mg/dL (ref 8–23)
CO2: 25 mmol/L (ref 22–32)
Calcium: 9 mg/dL (ref 8.9–10.3)
Chloride: 104 mmol/L (ref 98–111)
Creatinine, Ser: 0.66 mg/dL (ref 0.44–1.00)
GFR calc Af Amer: >60 mL/min (ref >60)
GFR calc non Af Amer: >60 mL/min (ref >60)
Glucose, Bld: 289 mg/dL — ABNORMAL HIGH (ref 70–99)
Potassium: 3.6 mmol/L (ref 3.5–5.1)
Sodium: 137 mmol/L (ref 135–145)
Total Bilirubin: 0.5 mg/dL (ref 0.3–1.2)
Total Protein: 7.2 g/dL (ref 6.5–8.1)

## 2020-04-09 LAB — CBC WITH DIFFERENTIAL/PLATELET
Abs Immature Granulocytes: 0.42 10*3/uL — ABNORMAL HIGH (ref 0.00–0.07)
Basophils Absolute: 0 10*3/uL (ref 0.0–0.1)
Basophils Relative: 0 %
Eosinophils Absolute: 0 10*3/uL (ref 0.0–0.5)
Eosinophils Relative: 0 %
HCT: 30.1 % — ABNORMAL LOW (ref 36.0–46.0)
Hemoglobin: 9.8 g/dL — ABNORMAL LOW (ref 12.0–15.0)
Immature Granulocytes: 6 %
Lymphocytes Relative: 8 %
Lymphs Abs: 0.6 10*3/uL — ABNORMAL LOW (ref 0.7–4.0)
MCH: 31.1 pg (ref 26.0–34.0)
MCHC: 32.6 g/dL (ref 30.0–36.0)
MCV: 95.6 fL (ref 80.0–100.0)
Monocytes Absolute: 0.6 10*3/uL (ref 0.1–1.0)
Monocytes Relative: 8 %
Neutro Abs: 6 10*3/uL (ref 1.7–7.7)
Neutrophils Relative %: 78 %
Platelets: 33 10*3/uL — ABNORMAL LOW (ref 150–400)
RBC: 3.15 MIL/uL — ABNORMAL LOW (ref 3.87–5.11)
RDW: 18.6 % — ABNORMAL HIGH (ref 11.5–15.5)
WBC: 7.7 10*3/uL (ref 4.0–10.5)
nRBC: 0.7 % — ABNORMAL HIGH (ref 0.0–0.2)

## 2020-04-10 ENCOUNTER — Telehealth: Payer: Self-pay | Admitting: *Deleted

## 2020-04-10 NOTE — Telephone Encounter (Signed)
Call received from patient stating that she feels "drunk/dizzy when sitting up in the bed" and hasn't been able to get OOB yet today.  Pt states that her BP and glucose are WNL.  She denies any pain or nausea and is eating and drinking without difficulty. Pt denies any falls or bleeding.  CBC and CMET results from 04/09/20 reviewed by Dr. [name].   Dr. [name] notified and would like for pt to hold Venetoclax until Friday and call office back with update on her status.  Pt instructed to go to the ER if her symptoms worsen.  Pt appreciative of assistance and has no further concerns at this time.

## 2020-04-12 DIAGNOSIS — B9562 Methicillin resistant Staphylococcus aureus infection as the cause of diseases classified elsewhere: Secondary | ICD-10-CM | POA: Diagnosis not present

## 2020-04-12 DIAGNOSIS — D500 Iron deficiency anemia secondary to blood loss (chronic): Secondary | ICD-10-CM | POA: Diagnosis not present

## 2020-04-12 DIAGNOSIS — E559 Vitamin D deficiency, unspecified: Secondary | ICD-10-CM | POA: Diagnosis not present

## 2020-04-12 DIAGNOSIS — E1122 Type 2 diabetes mellitus with diabetic chronic kidney disease: Secondary | ICD-10-CM | POA: Diagnosis not present

## 2020-04-12 DIAGNOSIS — I959 Hypotension, unspecified: Secondary | ICD-10-CM | POA: Diagnosis not present

## 2020-04-12 DIAGNOSIS — E782 Mixed hyperlipidemia: Secondary | ICD-10-CM | POA: Diagnosis not present

## 2020-04-12 DIAGNOSIS — C9000 Multiple myeloma not having achieved remission: Secondary | ICD-10-CM | POA: Diagnosis not present

## 2020-04-12 DIAGNOSIS — E1165 Type 2 diabetes mellitus with hyperglycemia: Secondary | ICD-10-CM | POA: Diagnosis not present

## 2020-04-12 DIAGNOSIS — E1169 Type 2 diabetes mellitus with other specified complication: Secondary | ICD-10-CM | POA: Diagnosis not present

## 2020-04-12 DIAGNOSIS — E249 Cushing's syndrome, unspecified: Secondary | ICD-10-CM | POA: Diagnosis not present

## 2020-04-12 DIAGNOSIS — D649 Anemia, unspecified: Secondary | ICD-10-CM | POA: Diagnosis not present

## 2020-04-12 DIAGNOSIS — E119 Type 2 diabetes mellitus without complications: Secondary | ICD-10-CM | POA: Diagnosis not present

## 2020-04-12 DIAGNOSIS — R42 Dizziness and giddiness: Secondary | ICD-10-CM | POA: Diagnosis not present

## 2020-04-12 DIAGNOSIS — D72819 Decreased white blood cell count, unspecified: Secondary | ICD-10-CM | POA: Diagnosis not present

## 2020-04-13 ENCOUNTER — Telehealth: Payer: Self-pay | Admitting: *Deleted

## 2020-04-13 NOTE — Telephone Encounter (Signed)
Message received from patient stating that she went to her PCP this week, was diagnosed with vertigo, feels "much better" and would like to know if she can re-start Venetoclax today.  Dr. [name] notified.  Call placed back to patient and patient notified per order of Dr. [name] that it is OK to restart Venetoclax today.  Pt appreciative of call and has no further questions or concerns at this time.

## 2020-04-16 ENCOUNTER — Inpatient Hospital Stay (HOSPITAL_COMMUNITY): Payer: Medicare Other

## 2020-04-16 ENCOUNTER — Other Ambulatory Visit: Payer: Self-pay

## 2020-04-16 ENCOUNTER — Other Ambulatory Visit (HOSPITAL_COMMUNITY): Payer: Self-pay

## 2020-04-16 DIAGNOSIS — C9000 Multiple myeloma not having achieved remission: Secondary | ICD-10-CM

## 2020-04-16 DIAGNOSIS — D696 Thrombocytopenia, unspecified: Secondary | ICD-10-CM

## 2020-04-16 DIAGNOSIS — C9002 Multiple myeloma in relapse: Secondary | ICD-10-CM

## 2020-04-16 LAB — CBC WITH DIFFERENTIAL/PLATELET
Abs Immature Granulocytes: 0.03 10*3/uL (ref 0.00–0.07)
Basophils Absolute: 0 10*3/uL (ref 0.0–0.1)
Basophils Relative: 0 %
Eosinophils Absolute: 0 10*3/uL (ref 0.0–0.5)
Eosinophils Relative: 0 %
HCT: 30.8 % — ABNORMAL LOW (ref 36.0–46.0)
Hemoglobin: 9.9 g/dL — ABNORMAL LOW (ref 12.0–15.0)
Immature Granulocytes: 1 %
Lymphocytes Relative: 18 %
Lymphs Abs: 0.4 10*3/uL — ABNORMAL LOW (ref 0.7–4.0)
MCH: 31 pg (ref 26.0–34.0)
MCHC: 32.1 g/dL (ref 30.0–36.0)
MCV: 96.6 fL (ref 80.0–100.0)
Monocytes Absolute: 0.2 10*3/uL (ref 0.1–1.0)
Monocytes Relative: 10 %
Neutro Abs: 1.7 10*3/uL (ref 1.7–7.7)
Neutrophils Relative %: 71 %
Platelets: 101 10*3/uL — ABNORMAL LOW (ref 150–400)
RBC: 3.19 MIL/uL — ABNORMAL LOW (ref 3.87–5.11)
RDW: 18.9 % — ABNORMAL HIGH (ref 11.5–15.5)
WBC: 2.4 10*3/uL — ABNORMAL LOW (ref 4.0–10.5)
nRBC: 0 % (ref 0.0–0.2)

## 2020-04-16 LAB — COMPREHENSIVE METABOLIC PANEL
ALT: 21 U/L (ref 0–44)
AST: 23 U/L (ref 15–41)
Albumin: 3.3 g/dL — ABNORMAL LOW (ref 3.5–5.0)
Alkaline Phosphatase: 182 U/L — ABNORMAL HIGH (ref 38–126)
Anion gap: 12 (ref 5–15)
BUN: 12 mg/dL (ref 8–23)
CO2: 21 mmol/L — ABNORMAL LOW (ref 22–32)
Calcium: 8.8 mg/dL — ABNORMAL LOW (ref 8.9–10.3)
Chloride: 105 mmol/L (ref 98–111)
Creatinine, Ser: 0.73 mg/dL (ref 0.44–1.00)
GFR calc Af Amer: >60 mL/min (ref >60)
GFR calc non Af Amer: >60 mL/min (ref >60)
Glucose, Bld: 259 mg/dL — ABNORMAL HIGH (ref 70–99)
Potassium: 4.2 mmol/L (ref 3.5–5.1)
Sodium: 138 mmol/L (ref 135–145)
Total Bilirubin: 0.5 mg/dL (ref 0.3–1.2)
Total Protein: 7.7 g/dL (ref 6.5–8.1)

## 2020-04-16 LAB — LACTATE DEHYDROGENASE: LDH: 243 U/L — ABNORMAL HIGH (ref 98–192)

## 2020-04-17 LAB — IGG, IGA, IGM
IgA: 15 mg/dL — ABNORMAL LOW (ref 64–422)
IgG (Immunoglobin G), Serum: 2243 mg/dL — ABNORMAL HIGH (ref 586–1602)
IgM (Immunoglobulin M), Srm: 26 mg/dL (ref 26–217)

## 2020-04-17 LAB — KAPPA/LAMBDA LIGHT CHAINS
Kappa free light chain: 156.9 mg/L — ABNORMAL HIGH (ref 3.3–19.4)
Kappa, lambda light chain ratio: 28.02 — ABNORMAL HIGH (ref 0.26–1.65)
Lambda free light chains: 5.6 mg/L — ABNORMAL LOW (ref 5.7–26.3)

## 2020-04-18 DIAGNOSIS — F331 Major depressive disorder, recurrent, moderate: Secondary | ICD-10-CM | POA: Diagnosis not present

## 2020-04-18 DIAGNOSIS — E1165 Type 2 diabetes mellitus with hyperglycemia: Secondary | ICD-10-CM | POA: Diagnosis not present

## 2020-04-18 DIAGNOSIS — F17210 Nicotine dependence, cigarettes, uncomplicated: Secondary | ICD-10-CM | POA: Diagnosis not present

## 2020-04-18 DIAGNOSIS — I129 Hypertensive chronic kidney disease with stage 1 through stage 4 chronic kidney disease, or unspecified chronic kidney disease: Secondary | ICD-10-CM | POA: Diagnosis not present

## 2020-04-18 DIAGNOSIS — E782 Mixed hyperlipidemia: Secondary | ICD-10-CM | POA: Diagnosis not present

## 2020-04-18 DIAGNOSIS — E876 Hypokalemia: Secondary | ICD-10-CM | POA: Diagnosis not present

## 2020-04-18 DIAGNOSIS — C9000 Multiple myeloma not having achieved remission: Secondary | ICD-10-CM | POA: Diagnosis not present

## 2020-04-18 DIAGNOSIS — E669 Obesity, unspecified: Secondary | ICD-10-CM | POA: Diagnosis not present

## 2020-04-18 DIAGNOSIS — E1122 Type 2 diabetes mellitus with diabetic chronic kidney disease: Secondary | ICD-10-CM | POA: Diagnosis not present

## 2020-04-18 DIAGNOSIS — D72819 Decreased white blood cell count, unspecified: Secondary | ICD-10-CM | POA: Diagnosis not present

## 2020-04-18 DIAGNOSIS — K219 Gastro-esophageal reflux disease without esophagitis: Secondary | ICD-10-CM | POA: Diagnosis not present

## 2020-04-18 DIAGNOSIS — D649 Anemia, unspecified: Secondary | ICD-10-CM | POA: Diagnosis not present

## 2020-04-19 ENCOUNTER — Inpatient Hospital Stay: Payer: Medicare Other

## 2020-04-19 ENCOUNTER — Inpatient Hospital Stay (HOSPITAL_BASED_OUTPATIENT_CLINIC_OR_DEPARTMENT_OTHER): Payer: Medicare Other | Admitting: Hematology & Oncology

## 2020-04-19 ENCOUNTER — Other Ambulatory Visit: Payer: Self-pay

## 2020-04-19 ENCOUNTER — Encounter: Payer: Self-pay | Admitting: Hematology & Oncology

## 2020-04-19 VITALS — BP 122/53 | HR 83 | Temp 97.1°F | Resp 18 | Ht 62.0 in | Wt 157.8 lb

## 2020-04-19 DIAGNOSIS — Z794 Long term (current) use of insulin: Secondary | ICD-10-CM | POA: Diagnosis not present

## 2020-04-19 DIAGNOSIS — Z79899 Other long term (current) drug therapy: Secondary | ICD-10-CM | POA: Diagnosis not present

## 2020-04-19 DIAGNOSIS — C9000 Multiple myeloma not having achieved remission: Secondary | ICD-10-CM

## 2020-04-19 DIAGNOSIS — Z9641 Presence of insulin pump (external) (internal): Secondary | ICD-10-CM | POA: Diagnosis not present

## 2020-04-19 DIAGNOSIS — R112 Nausea with vomiting, unspecified: Secondary | ICD-10-CM | POA: Diagnosis not present

## 2020-04-19 LAB — CMP (CANCER CENTER ONLY)
ALT: 15 U/L (ref 0–44)
AST: 16 U/L (ref 15–41)
Albumin: 3.7 g/dL (ref 3.5–5.0)
Alkaline Phosphatase: 187 U/L — ABNORMAL HIGH (ref 38–126)
Anion gap: 6 (ref 5–15)
BUN: 14 mg/dL (ref 8–23)
CO2: 26 mmol/L (ref 22–32)
Calcium: 8.9 mg/dL (ref 8.9–10.3)
Chloride: 108 mmol/L (ref 98–111)
Creatinine: 0.7 mg/dL (ref 0.44–1.00)
GFR, Est AFR Am: >60 mL/min (ref >60)
GFR, Estimated: >60 mL/min (ref >60)
Glucose, Bld: 152 mg/dL — ABNORMAL HIGH (ref 70–99)
Potassium: 3.6 mmol/L (ref 3.5–5.1)
Sodium: 140 mmol/L (ref 135–145)
Total Bilirubin: 0.4 mg/dL (ref 0.3–1.2)
Total Protein: 7.2 g/dL (ref 6.5–8.1)

## 2020-04-19 LAB — CBC WITH DIFFERENTIAL (CANCER CENTER ONLY)
Abs Immature Granulocytes: 0.02 10*3/uL (ref 0.00–0.07)
Basophils Absolute: 0 10*3/uL (ref 0.0–0.1)
Basophils Relative: 0 %
Eosinophils Absolute: 0 10*3/uL (ref 0.0–0.5)
Eosinophils Relative: 0 %
HCT: 27.8 % — ABNORMAL LOW (ref 36.0–46.0)
Hemoglobin: 9.1 g/dL — ABNORMAL LOW (ref 12.0–15.0)
Immature Granulocytes: 1 %
Lymphocytes Relative: 15 %
Lymphs Abs: 0.3 10*3/uL — ABNORMAL LOW (ref 0.7–4.0)
MCH: 31 pg (ref 26.0–34.0)
MCHC: 32.7 g/dL (ref 30.0–36.0)
MCV: 94.6 fL (ref 80.0–100.0)
Monocytes Absolute: 0.4 10*3/uL (ref 0.1–1.0)
Monocytes Relative: 18 %
Neutro Abs: 1.5 10*3/uL — ABNORMAL LOW (ref 1.7–7.7)
Neutrophils Relative %: 66 %
Platelet Count: 117 10*3/uL — ABNORMAL LOW (ref 150–400)
RBC: 2.94 MIL/uL — ABNORMAL LOW (ref 3.87–5.11)
RDW: 18.9 % — ABNORMAL HIGH (ref 11.5–15.5)
WBC Count: 2.3 10*3/uL — ABNORMAL LOW (ref 4.0–10.5)
nRBC: 0.9 % — ABNORMAL HIGH (ref 0.0–0.2)

## 2020-04-19 LAB — SAVE SMEAR(SSMR), FOR PROVIDER SLIDE REVIEW

## 2020-04-19 NOTE — Progress Notes (Signed)
Hematology and Oncology Follow Up Visit  Vicki Moon [id number] 1951/05/17 69 y.o. 04/19/2020   Principle Diagnosis:  IgG Kappa myeloma -- 1p-, 13q-, 16q- and t(11:14)  Past Therapy: RVD -- start on 05/09/2019 -- d/c revlimid on 06/14/2019 -- d/c on 06/28/2019 S/P Kyphoplasty at L2 XRT to lumbar spine -- completed in [facility] -- startedon 07/12/2019- s/p cycle4 -- d/c on 10/19/2019 due to disease progression  Current Therapy:   Selinexor/Velcade -- start cycle #1 on 02/06/2020 - d/c on 03/03 Kyprolis/Faspro -- start on 10/26/2019 -- s/p cycle #4 -- d/c due to progression Xgeva 120 mg sq q 3 months -- next dose on06/2021 Venetoclax 200 mg po q day -- started on 03/30/2020   Interim History:  Vicki Moon is here today for follow-up.  Hopefully, we are finally making some progress.  Her platelet count is now 110,000.  Hopefully, this is a sign that the venetoclax is working for [place].  She is not have any problems with venetoclax.  We do not need full dose venetoclax when we are using venetoclax for myeloma.  Her white cell count is doing much better.  Her hemoglobin is holding stable at 9.9.  In 2 weeks, her IgG level went from 3000 mg/dL down to 2200 mg/dL.  Her blood sugars are better controlled.  She is eating well according to her husband.  She is not having any problems with diarrhea.  She has had no problems with fever.  She has had no problems with leg swelling.  There is been no rashes.  I am just happy that her quality of life seems to be getting better.  Overall, her performance status is ECOG 2.    Medications:  Allergies as of 04/19/2020      Reactions   Metformin Diarrhea, Nausea Only      Medication List       Accurate as of April 19, 2020  2:10 PM. If you have any questions, ask your nurse or doctor.        ALPRAZolam 0.5 MG tablet Commonly known as: XANAX Take 0.5 mg by mouth at bedtime as needed.   Farxiga 10 MG Tabs tablet Generic  drug: dapagliflozin propanediol Take 10 mg by mouth daily.   FreeStyle [employer] Use one sensor every 10 days.   glipiZIDE 10 MG 24 hr tablet Commonly known as: GLUCOTROL XL Take 10 mg by mouth daily.   HYDROcodone-acetaminophen 5-325 MG tablet Commonly known as: NORCO/VICODIN Take 1 tablet by mouth every 6 (six) hours as needed for moderate pain. Received from [facility] ED.   latanoprost 0.005 % ophthalmic solution Commonly known as: XALATAN Place 1 drop into both eyes at bedtime.   lisinopril 10 MG tablet Commonly known as: ZESTRIL Take 10 mg by mouth daily.   meclizine 12.5 MG tablet Commonly known as: ANTIVERT Take 12.5 mg by mouth 3 (three) times daily as needed.   NovoLOG 100 UNIT/ML injection Generic drug: insulin aspart Inject 1.2-25 Units into the skin as directed. PATIENT USES INSULIN PUMP  Patient has a set basal rate of 1.2-1.5u per hour with meal time boluses of up to 25 units   [place] 1 Device by Does not apply route 3 (three) times a week. Patient put on insulin pump every 2 to 3 days for insulin delivery   ondansetron 8 MG tablet Commonly known as: ZOFRAN Take 1 tablet (8 mg total) by mouth every 8 (eight) hours as needed for  nausea or vomiting.   pantoprazole 40 MG tablet Commonly known as: Protonix Take 1 tablet (40 mg total) by mouth daily. What changed:   when to take this  reasons to take this   PARoxetine 30 MG tablet Commonly known as: PAXIL Take 30 mg by mouth every morning.   PRESCRIPTION MEDICATION Onmipod dash-insulin pump.Changes every 2 days. 5-25units ac based on blood sugar.   prochlorperazine 10 MG tablet Commonly known as: COMPAZINE Take 1 tablet (10 mg total) by mouth every 6 (six) hours as needed (Nausea or vomiting).   saccharomyces boulardii 250 MG capsule Commonly known as: FLORASTOR Take 1 capsule (250 mg total) by mouth 2 (two) times daily.   temazepam 15 MG capsule Commonly known  as: RESTORIL Take 1 capsule (15 mg total) by mouth at bedtime as needed for sleep.   venetoclax 100 MG Tabs Take 200 mg by mouth daily.       Allergies:  Allergies  Allergen Reactions  . Metformin Diarrhea and Nausea Only    Past Medical History, Surgical history, Social history, and Family History were reviewed and updated.  Review of Systems: Review of Systems  Constitutional: Negative.   HENT: Negative.   Eyes: Negative.   Respiratory: Negative.   Cardiovascular: Negative.   Gastrointestinal: Negative.   Genitourinary: Negative.   Musculoskeletal: Negative.   Skin: Negative.   Neurological: Negative.   Endo/Heme/Allergies: Negative.   Psychiatric/Behavioral: Negative.      Physical Exam:  height is '5\' 2"'$  (1.575 m) and weight is 157 lb 12.8 oz (71.6 kg). Her temporal temperature is 97.1 F (36.2 C) (abnormal). Her blood pressure is 122/53 (abnormal) and her pulse is 83. Her respiration is 18 and oxygen saturation is 100%.   Wt Readings from Last 3 Encounters:  04/19/20 157 lb 12.8 oz (71.6 kg)  04/02/20 156 lb (70.8 kg)  03/15/20 156 lb 15.5 oz (71.2 kg)    Physical Exam Vitals reviewed.  HENT:     Head: Normocephalic and atraumatic.  Eyes:     Pupils: Pupils are equal, round, and reactive to light.  Cardiovascular:     Rate and Rhythm: Normal rate and regular rhythm.     Heart sounds: Normal heart sounds.  Pulmonary:     Effort: Pulmonary effort is normal.     Breath sounds: Normal breath sounds.  Abdominal:     General: Bowel sounds are normal.     Palpations: Abdomen is soft.  Musculoskeletal:        General: No tenderness or deformity. Normal range of motion.     Cervical back: Normal range of motion.  Lymphadenopathy:     Cervical: No cervical adenopathy.  Skin:    General: Skin is warm and dry.     Findings: No erythema or rash.  Neurological:     Mental Status: She is alert and oriented to person, place, and time.  Psychiatric:         Behavior: Behavior normal.        Thought Content: Thought content normal.        Judgment: Judgment normal.      Lab Results  Component Value Date   WBC 2.3 (L) 04/19/2020   HGB 9.1 (L) 04/19/2020   HCT 27.8 (L) 04/19/2020   MCV 94.6 04/19/2020   PLT 117 (L) 04/19/2020   Lab Results  Component Value Date   FERRITIN 1,339 (H) 03/13/2020   IRON 101 03/13/2020   TIBC 194 (L) 03/13/2020   UIBC  92 (L) 03/13/2020   IRONPCTSAT 52 03/13/2020   Lab Results  Component Value Date   RETICCTPCT 1.2 02/15/2020   RBC 2.94 (L) 04/19/2020   Lab Results  Component Value Date   KPAFRELGTCHN 156.9 (H) 04/16/2020   LAMBDASER 5.6 (L) 04/16/2020   KAPLAMBRATIO 28.02 (H) 04/16/2020   Lab Results  Component Value Date   IGGSERUM 2,243 (H) 04/16/2020   IGA 15 (L) 04/16/2020   IGMSERUM 26 04/16/2020   Lab Results  Component Value Date   TOTALPROTELP 7.0 03/23/2020   ALBUMINELP 2.9 03/23/2020   A1GS 0.3 03/23/2020   A2GS 0.9 03/23/2020   BETS 0.7 03/23/2020   GAMS 2.2 (H) 03/23/2020   MSPIKE 2.0 (H) 03/23/2020   SPEI Comment 03/23/2020     Chemistry      Component Value Date/Time   NA 140 04/19/2020 1300   K 3.6 04/19/2020 1300   CL 108 04/19/2020 1300   CO2 26 04/19/2020 1300   BUN 14 04/19/2020 1300   CREATININE 0.70 04/19/2020 1300   CREATININE 0.65 01/12/2018 1004      Component Value Date/Time   CALCIUM 8.9 04/19/2020 1300   ALKPHOS 187 (H) 04/19/2020 1300   AST 16 04/19/2020 1300   ALT 15 04/19/2020 1300   BILITOT 0.4 04/19/2020 1300       Impression and Plan: Ms. Garro is a very pleasant 69 yo caucasian female with IgG kappa myeloma, with what I would consider to have high risk cytogenetics.  Again, I do believe that the venetoclax is finally doing something for [place].  She is tolerating the Benicar pretty well right now.  We will stop weekly lab work on her.  I would like to see her back in 3 to 4 weeks now.  Hopefully, we will see that she is continuing to  respond with a better protein level.  Again I just want her quality of life to improve so she can do what she would like with her family.Marland Kitchen     [name], MD 4/29/20212:10 PM

## 2020-04-20 LAB — SAMPLE TO BLOOD BANK

## 2020-04-23 ENCOUNTER — Inpatient Hospital Stay (HOSPITAL_COMMUNITY): Payer: Medicare Other

## 2020-04-23 LAB — PROTEIN ELECTROPHORESIS, SERUM, WITH REFLEX
A/G Ratio: 0.8 (ref 0.7–1.7)
Albumin ELP: 3.2 g/dL (ref 2.9–4.4)
Alpha-1-Globulin: 0.3 g/dL (ref 0.0–0.4)
Alpha-2-Globulin: 0.9 g/dL (ref 0.4–1.0)
Beta Globulin: 0.9 g/dL (ref 0.7–1.3)
Gamma Globulin: 1.8 g/dL (ref 0.4–1.8)
Globulin, Total: 3.8 g/dL (ref 2.2–3.9)
M-Spike, %: 1.6 g/dL — ABNORMAL HIGH
SPEP Interpretation: 0
Total Protein ELP: 7 g/dL (ref 6.0–8.5)

## 2020-04-23 LAB — IMMUNOFIXATION REFLEX, SERUM
IgA: 16 mg/dL — ABNORMAL LOW (ref 64–422)
IgG (Immunoglobin G), Serum: 2273 mg/dL — ABNORMAL HIGH (ref 586–1602)
IgM (Immunoglobulin M), Srm: 30 mg/dL (ref 26–217)

## 2020-04-26 DIAGNOSIS — E7849 Other hyperlipidemia: Secondary | ICD-10-CM | POA: Diagnosis not present

## 2020-04-26 DIAGNOSIS — E119 Type 2 diabetes mellitus without complications: Secondary | ICD-10-CM | POA: Diagnosis not present

## 2020-04-26 DIAGNOSIS — E1169 Type 2 diabetes mellitus with other specified complication: Secondary | ICD-10-CM | POA: Diagnosis not present

## 2020-04-26 DIAGNOSIS — E1165 Type 2 diabetes mellitus with hyperglycemia: Secondary | ICD-10-CM | POA: Diagnosis not present

## 2020-04-26 DIAGNOSIS — I10 Essential (primary) hypertension: Secondary | ICD-10-CM | POA: Diagnosis not present

## 2020-04-26 MED FILL — VENCLEXTA 100 MG TABS: 100 | 30 days supply | Qty: 60 | Fill #1

## 2020-04-30 ENCOUNTER — Other Ambulatory Visit (HOSPITAL_COMMUNITY): Payer: Medicare Other

## 2020-05-08 ENCOUNTER — Ambulatory Visit (HOSPITAL_BASED_OUTPATIENT_CLINIC_OR_DEPARTMENT_OTHER)
Admission: RE | Admit: 2020-05-08 | Discharge: 2020-05-08 | Disposition: A | Discharge status: Home / Self Care | Payer: Medicare Other | Source: Ambulatory Visit | Attending: Hematology & Oncology | Admitting: Hematology & Oncology

## 2020-05-08 ENCOUNTER — Inpatient Hospital Stay: Payer: Medicare Other

## 2020-05-08 ENCOUNTER — Encounter: Payer: Self-pay | Admitting: Hematology & Oncology

## 2020-05-08 ENCOUNTER — Other Ambulatory Visit (HOSPITAL_BASED_OUTPATIENT_CLINIC_OR_DEPARTMENT_OTHER): Payer: Self-pay | Admitting: Hematology & Oncology

## 2020-05-08 ENCOUNTER — Other Ambulatory Visit: Payer: Self-pay

## 2020-05-08 ENCOUNTER — Inpatient Hospital Stay: Payer: Medicare Other | Attending: Hematology & Oncology | Admitting: Hematology & Oncology

## 2020-05-08 ENCOUNTER — Telehealth: Payer: Self-pay | Admitting: Hematology & Oncology

## 2020-05-08 VITALS — BP 110/70 | HR 84 | Temp 96.4°F | Resp 20 | Wt 159.4 lb

## 2020-05-08 DIAGNOSIS — R222 Localized swelling, mass and lump, trunk: Secondary | ICD-10-CM | POA: Diagnosis not present

## 2020-05-08 DIAGNOSIS — C9002 Multiple myeloma in relapse: Secondary | ICD-10-CM

## 2020-05-08 DIAGNOSIS — C9000 Multiple myeloma not having achieved remission: Secondary | ICD-10-CM | POA: Diagnosis not present

## 2020-05-08 DIAGNOSIS — Z79899 Other long term (current) drug therapy: Secondary | ICD-10-CM | POA: Diagnosis not present

## 2020-05-08 LAB — CBC WITH DIFFERENTIAL (CANCER CENTER ONLY)
Abs Immature Granulocytes: 0.02 10*3/uL (ref 0.00–0.07)
Basophils Absolute: 0 10*3/uL (ref 0.0–0.1)
Basophils Relative: 0 %
Eosinophils Absolute: 0 10*3/uL (ref 0.0–0.5)
Eosinophils Relative: 0 %
HCT: 28.6 % — ABNORMAL LOW (ref 36.0–46.0)
Hemoglobin: 9.1 g/dL — ABNORMAL LOW (ref 12.0–15.0)
Immature Granulocytes: 1 %
Lymphocytes Relative: 11 %
Lymphs Abs: 0.4 10*3/uL — ABNORMAL LOW (ref 0.7–4.0)
MCH: 31.1 pg (ref 26.0–34.0)
MCHC: 31.8 g/dL (ref 30.0–36.0)
MCV: 97.6 fL (ref 80.0–100.0)
Monocytes Absolute: 0.4 10*3/uL (ref 0.1–1.0)
Monocytes Relative: 14 %
Neutro Abs: 2.5 10*3/uL (ref 1.7–7.7)
Neutrophils Relative %: 74 %
Platelet Count: 175 10*3/uL (ref 150–400)
RBC: 2.93 MIL/uL — ABNORMAL LOW (ref 3.87–5.11)
RDW: 21.1 % — ABNORMAL HIGH (ref 11.5–15.5)
WBC Count: 3.3 10*3/uL — ABNORMAL LOW (ref 4.0–10.5)
nRBC: 0 % (ref 0.0–0.2)

## 2020-05-08 LAB — CMP (CANCER CENTER ONLY)
ALT: 10 U/L (ref 0–44)
AST: 17 U/L (ref 15–41)
Albumin: 3.6 g/dL (ref 3.5–5.0)
Alkaline Phosphatase: 169 U/L — ABNORMAL HIGH (ref 38–126)
Anion gap: 7 (ref 5–15)
BUN: 11 mg/dL (ref 8–23)
CO2: 26 mmol/L (ref 22–32)
Calcium: 9.1 mg/dL (ref 8.9–10.3)
Chloride: 108 mmol/L (ref 98–111)
Creatinine: 0.79 mg/dL (ref 0.44–1.00)
GFR, Est AFR Am: >60 mL/min (ref >60)
GFR, Estimated: >60 mL/min (ref >60)
Glucose, Bld: 168 mg/dL — ABNORMAL HIGH (ref 70–99)
Potassium: 3.4 mmol/L — ABNORMAL LOW (ref 3.5–5.1)
Sodium: 141 mmol/L (ref 135–145)
Total Bilirubin: 0.4 mg/dL (ref 0.3–1.2)
Total Protein: 6.6 g/dL (ref 6.5–8.1)

## 2020-05-08 LAB — LACTATE DEHYDROGENASE: LDH: 294 U/L — ABNORMAL HIGH (ref 98–192)

## 2020-05-08 LAB — SAMPLE TO BLOOD BANK

## 2020-05-08 MED ORDER — TEMAZEPAM 15 MG PO CAPS: 15.0000 mg | ORAL_CAPSULE | Freq: Every evening | ORAL | 0 refills | Status: DC | PRN

## 2020-05-08 NOTE — Addendum Note (Signed)
Addended by: [name] on: 05/08/2020 04:33 PM   Modules accepted: Orders

## 2020-05-08 NOTE — Progress Notes (Signed)
Hematology and Oncology Follow Up Visit  Vicki Moon [id number] 05-20-1951 69 y.o. 05/08/2020   Principle Diagnosis:  IgG Kappa myeloma -- 1p-, 13q-, 16q- and t(11:14)  Past Therapy: RVD -- start on 05/09/2019 -- d/c revlimid on 06/14/2019 -- d/c on 06/28/2019 S/P Kyphoplasty at L2 XRT to lumbar spine -- completed in [facility] -- startedon 07/12/2019- s/p cycle4 -- d/c on 10/19/2019 due to disease progression  Current Therapy:   Selinexor/Velcade -- start cycle #1 on 02/06/2020 - d/c on 03/03 Kyprolis/Faspro -- start on 10/26/2019 -- s/p cycle #4 -- d/c due to progression Xgeva 120 mg sq q 3 months -- next dose on06/2021 Venetoclax 200 mg po q day -- started on 03/30/2020   Interim History:  Vicki Moon is here today for follow-up.  I know that we are now making good progress.  She walked in.  She is using a cane.  But, she still walked in.  She also has a platelet count of 175,000 now.  When we first started the venetoclax, the platelet count was down to about 17,000.  Her white cell count is also increasing.  We last saw her, her M spike was down to 1.4 g/dL.  Of interest, is the fact that there is this mass in the right axilla.  She says she has had it for 2-3 months.  She thinks that it is getting a little bit smaller.  We will have to get a CT scan to see if this is truly a solid mass and if so, whether or not we need to biopsy it to see if it is a plasmacytoma.  Her blood sugars are also better.  She now has insulin pump going.  She has had no problems with fever.  There is no cough.  She has had no leg swelling.   Overall, her performance status is ECOG 1.    Medications:  Allergies as of 05/08/2020      Reactions   Metformin Diarrhea, Nausea Only      Medication List       Accurate as of May 08, 2020  2:15 PM. If you have any questions, ask your nurse or doctor.        STOP taking these medications   pantoprazole 40 MG tablet Commonly known  as: Protonix Stopped by: [name], MD     TAKE these medications   ALPRAZolam 0.5 MG tablet Commonly known as: XANAX Take 0.5 mg by mouth at bedtime as needed.   Farxiga 10 MG Tabs tablet Generic drug: dapagliflozin propanediol Take 10 mg by mouth daily.   FreeStyle [employer] Use one sensor every 10 days.   glipiZIDE 10 MG 24 hr tablet Commonly known as: GLUCOTROL XL Take 10 mg by mouth daily.   HYDROcodone-acetaminophen 5-325 MG tablet Commonly known as: NORCO/VICODIN Take 1 tablet by mouth every 6 (six) hours as needed for moderate pain. Received from [facility].   hydrOXYzine 50 MG capsule Commonly known as: VISTARIL Take 50 mg by mouth every 6 (six) hours as needed.   latanoprost 0.005 % ophthalmic solution Commonly known as: XALATAN Place 1 drop into both eyes at bedtime.   lisinopril 10 MG tablet Commonly known as: ZESTRIL Take 10 mg by mouth daily.   meclizine 12.5 MG tablet Commonly known as: ANTIVERT Take 12.5 mg by mouth 3 (three) times daily as needed.   NovoLOG 100 UNIT/ML injection Generic drug: insulin aspart Inject 1.2-25 Units into the skin  as directed. PATIENT USES INSULIN PUMP  Patient has a set basal rate of 1.2-1.5u per hour with meal time boluses of up to 25 units   [place] 1 Device by Does not apply route 3 (three) times a week. Patient put on insulin pump every 2 to 3 days for insulin delivery   ondansetron 8 MG tablet Commonly known as: ZOFRAN Take 1 tablet (8 mg total) by mouth every 8 (eight) hours as needed for nausea or vomiting.   PARoxetine 30 MG tablet Commonly known as: PAXIL Take 30 mg by mouth every morning.   PRESCRIPTION MEDICATION Onmipod dash-insulin pump.Changes every 2 days. 5-25units ac based on blood sugar.   prochlorperazine 10 MG tablet Commonly known as: COMPAZINE Take 1 tablet (10 mg total) by mouth every 6 (six) hours as needed (Nausea or vomiting).   saccharomyces  boulardii 250 MG capsule Commonly known as: FLORASTOR Take 1 capsule (250 mg total) by mouth 2 (two) times daily.   temazepam 15 MG capsule Commonly known as: RESTORIL Take 1 capsule (15 mg total) by mouth at bedtime as needed for sleep.   venetoclax 100 MG Tabs Take 200 mg by mouth daily.   Vitamin D3 1.25 MG (50000 UT) Caps Take 1 capsule by mouth once a week.       Allergies:  Allergies  Allergen Reactions  . Metformin Diarrhea and Nausea Only    Past Medical History, Surgical history, Social history, and Family History were reviewed and updated.  Review of Systems: Review of Systems  Constitutional: Negative.   HENT: Negative.   Eyes: Negative.   Respiratory: Negative.   Cardiovascular: Negative.   Gastrointestinal: Negative.   Genitourinary: Negative.   Musculoskeletal: Negative.   Skin: Negative.   Neurological: Negative.   Endo/Heme/Allergies: Negative.   Psychiatric/Behavioral: Negative.      Physical Exam:  weight is 159 lb 6.4 oz (72.3 kg). Her temporal temperature is 96.4 F (35.8 C) (abnormal). Her blood pressure is 110/70 and her pulse is 84. Her respiration is 20 and oxygen saturation is 100%.   Wt Readings from Last 3 Encounters:  05/08/20 159 lb 6.4 oz (72.3 kg)  04/19/20 157 lb 12.8 oz (71.6 kg)  04/02/20 156 lb (70.8 kg)    Physical Exam Vitals reviewed.  HENT:     Head: Normocephalic and atraumatic.  Eyes:     Pupils: Pupils are equal, round, and reactive to light.  Cardiovascular:     Rate and Rhythm: Normal rate and regular rhythm.     Heart sounds: Normal heart sounds.  Pulmonary:     Effort: Pulmonary effort is normal.     Breath sounds: Normal breath sounds.  Abdominal:     General: Bowel sounds are normal.     Palpations: Abdomen is soft.  Musculoskeletal:        General: No tenderness or deformity. Normal range of motion.     Cervical back: Normal range of motion.  Lymphadenopathy:     Cervical: No cervical adenopathy.    Skin:    General: Skin is warm and dry.     Findings: No erythema or rash.  Neurological:     Mental Status: She is alert and oriented to person, place, and time.  Psychiatric:        Behavior: Behavior normal.        Thought Content: Thought content normal.        Judgment: Judgment normal.      Lab Results  Component Value Date   WBC 3.3 (L) 05/08/2020   HGB 9.1 (L) 05/08/2020   HCT 28.6 (L) 05/08/2020   MCV 97.6 05/08/2020   PLT 175 05/08/2020   Lab Results  Component Value Date   FERRITIN 1,339 (H) 03/13/2020   IRON 101 03/13/2020   TIBC 194 (L) 03/13/2020   UIBC 92 (L) 03/13/2020   IRONPCTSAT 52 03/13/2020   Lab Results  Component Value Date   RETICCTPCT 1.2 02/15/2020   RBC 2.93 (L) 05/08/2020   Lab Results  Component Value Date   KPAFRELGTCHN 156.9 (H) 04/16/2020   LAMBDASER 5.6 (L) 04/16/2020   KAPLAMBRATIO 28.02 (H) 04/16/2020   Lab Results  Component Value Date   IGGSERUM 2,273 (H) 04/19/2020   IGA 16 (L) 04/19/2020   IGMSERUM 30 04/19/2020   Lab Results  Component Value Date   TOTALPROTELP 7.0 04/19/2020   ALBUMINELP 3.2 04/19/2020   A1GS 0.3 04/19/2020   A2GS 0.9 04/19/2020   BETS 0.9 04/19/2020   GAMS 1.8 04/19/2020   MSPIKE 1.6 (H) 04/19/2020   SPEI Comment 03/23/2020     Chemistry      Component Value Date/Time   NA 141 05/08/2020 1303   K 3.4 (L) 05/08/2020 1303   CL 108 05/08/2020 1303   CO2 26 05/08/2020 1303   BUN 11 05/08/2020 1303   CREATININE 0.79 05/08/2020 1303   CREATININE 0.65 01/12/2018 1004      Component Value Date/Time   CALCIUM 9.1 05/08/2020 1303   ALKPHOS 169 (H) 05/08/2020 1303   AST 17 05/08/2020 1303   ALT 10 05/08/2020 1303   BILITOT 0.4 05/08/2020 1303       Impression and Plan: Ms. Viruet is a very pleasant 69 yo caucasian female with IgG kappa myeloma, with what I would consider to have high risk cytogenetics.  Again, I do believe that the venetoclax is finally doing something for [place].    I  told her to stop the lisinopril.  Maybe this is causing some of the dizziness that she has.  We will get the CAT scan of her chest today.  We will see if there is a mass in the right axilla.  If so, this might need to be biopsied to see if it is a plasmacytoma.  I think we can probably see her back now in 6 weeks.  When we see her back, she will need her Xgeva.   [name], MD 5/18/20212:15 PM

## 2020-05-08 NOTE — Telephone Encounter (Signed)
Appointments scheduled calendar printed by Imaging Dept due to STAT CT ordered by Dr [name] per 5/18 los

## 2020-05-09 LAB — KAPPA/LAMBDA LIGHT CHAINS
Kappa free light chain: 212.9 mg/L — ABNORMAL HIGH (ref 3.3–19.4)
Kappa, lambda light chain ratio: 32.26 — ABNORMAL HIGH (ref 0.26–1.65)
Lambda free light chains: 6.6 mg/L (ref 5.7–26.3)

## 2020-05-09 LAB — IGG, IGA, IGM
IgA: 9 mg/dL — ABNORMAL LOW (ref 64–422)
IgG (Immunoglobin G), Serum: 2194 mg/dL — ABNORMAL HIGH (ref 586–1602)
IgM (Immunoglobulin M), Srm: 17 mg/dL — ABNORMAL LOW (ref 26–217)

## 2020-05-11 ENCOUNTER — Encounter: Payer: Self-pay | Admitting: Hematology & Oncology

## 2020-05-11 ENCOUNTER — Other Ambulatory Visit: Payer: Self-pay

## 2020-05-11 ENCOUNTER — Inpatient Hospital Stay (HOSPITAL_BASED_OUTPATIENT_CLINIC_OR_DEPARTMENT_OTHER): Payer: Medicare Other | Admitting: Hematology & Oncology

## 2020-05-11 VITALS — BP 124/53 | HR 78 | Temp 97.1°F | Resp 18 | Wt 159.0 lb

## 2020-05-11 DIAGNOSIS — C9000 Multiple myeloma not having achieved remission: Secondary | ICD-10-CM

## 2020-05-11 DIAGNOSIS — R222 Localized swelling, mass and lump, trunk: Secondary | ICD-10-CM | POA: Diagnosis not present

## 2020-05-11 DIAGNOSIS — Z79899 Other long term (current) drug therapy: Secondary | ICD-10-CM | POA: Diagnosis not present

## 2020-05-11 LAB — PROTEIN ELECTROPHORESIS, SERUM, WITH REFLEX
A/G Ratio: 0.8 (ref 0.7–1.7)
Albumin ELP: 3.1 g/dL (ref 2.9–4.4)
Alpha-1-Globulin: 0.3 g/dL (ref 0.0–0.4)
Alpha-2-Globulin: 0.8 g/dL (ref 0.4–1.0)
Beta Globulin: 0.9 g/dL (ref 0.7–1.3)
Gamma Globulin: 1.9 g/dL — ABNORMAL HIGH (ref 0.4–1.8)
Globulin, Total: 3.9 g/dL (ref 2.2–3.9)
M-Spike, %: 1.6 g/dL — ABNORMAL HIGH
SPEP Interpretation: 0
Total Protein ELP: 7 g/dL (ref 6.0–8.5)

## 2020-05-11 LAB — IMMUNOFIXATION REFLEX, SERUM
IgA: 11 mg/dL — ABNORMAL LOW (ref 64–422)
IgG (Immunoglobin G), Serum: 2342 mg/dL — ABNORMAL HIGH (ref 586–1602)
IgM (Immunoglobulin M), Srm: 28 mg/dL (ref 26–217)

## 2020-05-11 NOTE — Progress Notes (Signed)
Hematology and Oncology Follow Up Visit  Vicki Moon [id number] [birth year] 69 y.o. 05/11/2020   Principle Diagnosis:  IgG Kappa myeloma -- 1p-, 13q-, 16q- and t(11:14)  Past Therapy: RVD -- start on 05/09/2019 -- d/c revlimid on 06/14/2019 -- d/c on 06/28/2019 S/P Kyphoplasty at L2 XRT to lumbar spine -- completed in [facility] -- startedon 07/12/2019- s/p cycle4 -- d/c on 10/19/2019 due to disease progression  Current Therapy:   Selinexor/Velcade -- start cycle #1 on 02/06/2020 - d/c on 03/03 Kyprolis/Faspro -- start on 10/26/2019 -- s/p cycle #4 -- d/c due to progression Xgeva 120 mg sq q 3 months -- next dose on06/2021 Venetoclax 200 mg po q day -- started on 03/30/2020   Interim History:  Vicki Moon is here today for an early visit.  I saw her last week.  When I saw her last week, she was noting a mass adjacent to the right axilla.  I went ahead and got a CT scan of her chest.  This was incredibly surprising as to the results.  There was a mass as infiltrated surrounding the mid and inferior portion of the scapula and extending from there.  It measured 13.3 x 8.9 cm x 14.6 cm.  There was an adjacent mass posterior to the scapula measuring 2.8 x 2.7 cm.  There are lytic changes in the scapula and other bony areas.  There was a 4.4 x 1.6 cm left paraspinal mass in the lower thoracic spine.  Unfortunately, I had believe this is part of her myeloma.  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  When we saw her, her myeloma studies actually look stable.  The M spike was 1.6 g/dL.  Her IgG level was 2200 mg/dL.  The kappa light chain was 21.3 mg/dL.  She has been on venetoclax for a little over a month.  These areas are not really hurting her.  She is having no problems with pain.  She is having no shortness of breath.  She is having no change in bowel or bladder habits.  There is no rashes.  She has had no obvious swollen lymph nodes.    Medications:  Allergies  as of 05/11/2020      Reactions   Metformin Diarrhea, Nausea Only      Medication List       Accurate as of May 11, 2020  1:21 PM. If you have any questions, ask your nurse or doctor.        ALPRAZolam 0.5 MG tablet Commonly known as: XANAX Take 0.5 mg by mouth at bedtime as needed.   Farxiga 10 MG Tabs tablet Generic drug: dapagliflozin propanediol Take 10 mg by mouth daily.   FreeStyle [employer] Use one sensor every 10 days.   glipiZIDE 10 MG 24 hr tablet Commonly known as: GLUCOTROL XL Take 10 mg by mouth daily.   HYDROcodone-acetaminophen 5-325 MG tablet Commonly known as: NORCO/VICODIN Take 1 tablet by mouth every 6 (six) hours as needed for moderate pain. Received from [facility] ED.   hydrOXYzine 50 MG capsule Commonly known as: VISTARIL Take 50 mg by mouth every 6 (six) hours as needed.   latanoprost 0.005 % ophthalmic solution Commonly known as: XALATAN Place 1 drop into both eyes at bedtime.   lisinopril 10 MG tablet Commonly known as: ZESTRIL Take 10 mg by mouth daily.   meclizine 12.5 MG tablet Commonly known as: ANTIVERT Take 12.5 mg by mouth 3 (three) times daily as needed.  NovoLOG 100 UNIT/ML injection Generic drug: insulin aspart Inject 1.2-25 Units into the skin as directed. PATIENT USES INSULIN PUMP  Patient has a set basal rate of 1.2-1.5u per hour with meal time boluses of up to 25 units   [place] 1 Device by Does not apply route 3 (three) times a week. Patient put on insulin pump every 2 to 3 days for insulin delivery   ondansetron 8 MG tablet Commonly known as: ZOFRAN Take 1 tablet (8 mg total) by mouth every 8 (eight) hours as needed for nausea or vomiting.   PARoxetine 30 MG tablet Commonly known as: PAXIL Take 30 mg by mouth every morning.   PRESCRIPTION MEDICATION Onmipod dash-insulin pump.Changes every 2 days. 5-25units ac based on blood sugar.   prochlorperazine 10 MG tablet Commonly known  as: COMPAZINE Take 1 tablet (10 mg total) by mouth every 6 (six) hours as needed (Nausea or vomiting).   saccharomyces boulardii 250 MG capsule Commonly known as: FLORASTOR Take 1 capsule (250 mg total) by mouth 2 (two) times daily.   temazepam 15 MG capsule Commonly known as: RESTORIL Take 1 capsule (15 mg total) by mouth at bedtime as needed for sleep.   venetoclax 100 MG Tabs Take 200 mg by mouth daily.   Vitamin D3 1.25 MG (50000 UT) Caps Take 1 capsule by mouth once a week.       Allergies:  Allergies  Allergen Reactions  . Metformin Diarrhea and Nausea Only    Past Medical History, Surgical history, Social history, and Family History were reviewed and updated.  Review of Systems: Review of Systems  Constitutional: Negative.   HENT: Negative.   Eyes: Negative.   Respiratory: Negative.   Cardiovascular: Negative.   Gastrointestinal: Negative.   Genitourinary: Negative.   Musculoskeletal: Negative.   Skin: Negative.   Neurological: Negative.   Endo/Heme/Allergies: Negative.   Psychiatric/Behavioral: Negative.      Physical Exam:  weight is 159 lb (72.1 kg). Her temporal temperature is 97.1 F (36.2 C) (abnormal). Her blood pressure is 124/53 (abnormal) and her pulse is 78. Her respiration is 18 and oxygen saturation is 100%.   Wt Readings from Last 3 Encounters:  05/11/20 159 lb (72.1 kg)  05/08/20 159 lb 6.4 oz (72.3 kg)  04/19/20 157 lb 12.8 oz (71.6 kg)    Physical Exam Vitals reviewed.  HENT:     Head: Normocephalic and atraumatic.  Eyes:     Pupils: Pupils are equal, round, and reactive to light.  Cardiovascular:     Rate and Rhythm: Normal rate and regular rhythm.     Heart sounds: Normal heart sounds.  Pulmonary:     Effort: Pulmonary effort is normal.     Breath sounds: Normal breath sounds.  Abdominal:     General: Bowel sounds are normal.     Palpations: Abdomen is soft.  Musculoskeletal:        General: No tenderness or deformity.  Normal range of motion.     Cervical back: Normal range of motion.  Lymphadenopathy:     Cervical: No cervical adenopathy.  Skin:    General: Skin is warm and dry.     Findings: No erythema or rash.  Neurological:     Mental Status: She is alert and oriented to person, place, and time.  Psychiatric:        Behavior: Behavior normal.        Thought Content: Thought content normal.  Judgment: Judgment normal.      Lab Results  Component Value Date   WBC 3.3 (L) 05/08/2020   HGB 9.1 (L) 05/08/2020   HCT 28.6 (L) 05/08/2020   MCV 97.6 05/08/2020   PLT 175 05/08/2020   Lab Results  Component Value Date   FERRITIN 1,339 (H) 03/13/2020   IRON 101 03/13/2020   TIBC 194 (L) 03/13/2020   UIBC 92 (L) 03/13/2020   IRONPCTSAT 52 03/13/2020   Lab Results  Component Value Date   RETICCTPCT 1.2 02/15/2020   RBC 2.93 (L) 05/08/2020   Lab Results  Component Value Date   KPAFRELGTCHN 212.9 (H) 05/08/2020   LAMBDASER 6.6 05/08/2020   KAPLAMBRATIO 32.26 (H) 05/08/2020   Lab Results  Component Value Date   IGGSERUM 2,194 (H) 05/08/2020   IGGSERUM 2,342 (H) 05/08/2020   IGA 9 (L) 05/08/2020   IGA 11 (L) 05/08/2020   IGMSERUM 17 (L) 05/08/2020   IGMSERUM 28 05/08/2020   Lab Results  Component Value Date   TOTALPROTELP 7.0 05/08/2020   ALBUMINELP 3.1 05/08/2020   A1GS 0.3 05/08/2020   A2GS 0.8 05/08/2020   BETS 0.9 05/08/2020   GAMS 1.9 (H) 05/08/2020   MSPIKE 1.6 (H) 05/08/2020   SPEI Comment 03/23/2020     Chemistry      Component Value Date/Time   NA 141 05/08/2020 1303   K 3.4 (L) 05/08/2020 1303   CL 108 05/08/2020 1303   CO2 26 05/08/2020 1303   BUN 11 05/08/2020 1303   CREATININE 0.79 05/08/2020 1303   CREATININE 0.65 01/12/2018 1004      Component Value Date/Time   CALCIUM 9.1 05/08/2020 1303   ALKPHOS 169 (H) 05/08/2020 1303   AST 17 05/08/2020 1303   ALT 10 05/08/2020 1303   BILITOT 0.4 05/08/2020 1303       Impression and Plan: Vicki Moon  is a very pleasant 69 yo caucasian female with IgG kappa myeloma, with what I would consider to have high risk cytogenetics.  We are clearly going to have to get a biopsy now.  I think this right scapular mass would be the easiest site to biopsy.  I am sure we get multiple core specimens.  I cannot believe that this is some other malignancy.  If it is plasmacytomas, she will definitely need radiation therapy.  She is already on [place].  She is due for Xgeva in June.  I must say that Vicki Moon clearly has a very difficult myeloma.  This is been notoriously difficult to treat from the outset.  I am sure the cytogenetics are not helping.   [name], MD 5/21/20211:21 PM

## 2020-05-14 ENCOUNTER — Telehealth: Payer: Self-pay | Admitting: *Deleted

## 2020-05-14 NOTE — Telephone Encounter (Signed)
Received call from patient today inquiring about the scheduling of her Biopsy at [facility].  States she hasnt heard anything.  Talked with Vonte who stated that it was just approved this morning and she was working on it.  Was under review and they would call her with appointment soon

## 2020-05-15 ENCOUNTER — Encounter (HOSPITAL_COMMUNITY): Payer: Self-pay | Admitting: Radiology

## 2020-05-15 NOTE — Progress Notes (Signed)
Ridhi L. Plante Female, 69 y.o., 01-16-1951 MRN:  [id number] Phone:  [phone] Jerilynn Mages) PCP:  [name], MD Primary Cvg:  Medicare/Medicare Part A And B Next Appt With Radiology ([facility]-US 2) 05/24/2020 at 1:00 PM  RE: [place] Core Biopsy (lymph Nodes) Received: Today Message Contents  Corrie Mckusick, DO  Garth Bigness D  OK for US guided right axillary node biopsy.  Earleen Newport       Previous Messages   ----- Message -----  From: Garth Bigness D  Sent: 05/14/2020  1:45 PM EDT  To: [facility] Procedure Requests  Subject: [place] Core Biopsy (lymph Nodes)           Procedure:  [place] CORE BIOPSY (LYMPH NODES)   Reason: Multiple myeloma not having achieved remission, Myeloma. Now with large mass in the right axilla/shoulder. Question if this is a plasmacytoma??, Please get multiple biopsies so we can send off for all of our molecular studies.   History: CT in computer, IR Bone Marrow in computer   Provider: [name]   Provider Contact: [phone]

## 2020-05-16 ENCOUNTER — Telehealth: Payer: Self-pay | Admitting: Hematology & Oncology

## 2020-05-16 NOTE — Telephone Encounter (Signed)
Called and spoke with patient about appointments added.  She was in agreement with both date/time per 5/21 los

## 2020-05-23 ENCOUNTER — Other Ambulatory Visit: Payer: Self-pay | Admitting: Student

## 2020-05-23 ENCOUNTER — Other Ambulatory Visit: Payer: Self-pay | Admitting: Radiology

## 2020-05-24 ENCOUNTER — Other Ambulatory Visit: Payer: Self-pay

## 2020-05-24 ENCOUNTER — Encounter (HOSPITAL_COMMUNITY): Payer: Self-pay

## 2020-05-24 ENCOUNTER — Ambulatory Visit (HOSPITAL_COMMUNITY)
Admission: RE | Admit: 2020-05-24 | Discharge: 2020-05-24 | Disposition: A | Discharge status: Home / Self Care | Payer: Medicare Other | Source: Ambulatory Visit | Attending: Hematology & Oncology | Admitting: Hematology & Oncology

## 2020-05-24 DIAGNOSIS — H409 Unspecified glaucoma: Secondary | ICD-10-CM | POA: Diagnosis not present

## 2020-05-24 DIAGNOSIS — Z8249 Family history of ischemic heart disease and other diseases of the circulatory system: Secondary | ICD-10-CM | POA: Insufficient documentation

## 2020-05-24 DIAGNOSIS — R2231 Localized swelling, mass and lump, right upper limb: Secondary | ICD-10-CM | POA: Diagnosis not present

## 2020-05-24 DIAGNOSIS — Z823 Family history of stroke: Secondary | ICD-10-CM | POA: Diagnosis not present

## 2020-05-24 DIAGNOSIS — F17210 Nicotine dependence, cigarettes, uncomplicated: Secondary | ICD-10-CM | POA: Diagnosis not present

## 2020-05-24 DIAGNOSIS — R222 Localized swelling, mass and lump, trunk: Secondary | ICD-10-CM | POA: Diagnosis not present

## 2020-05-24 DIAGNOSIS — C9000 Multiple myeloma not having achieved remission: Secondary | ICD-10-CM | POA: Diagnosis not present

## 2020-05-24 DIAGNOSIS — Z79899 Other long term (current) drug therapy: Secondary | ICD-10-CM | POA: Diagnosis not present

## 2020-05-24 DIAGNOSIS — Z9641 Presence of insulin pump (external) (internal): Secondary | ICD-10-CM | POA: Diagnosis not present

## 2020-05-24 DIAGNOSIS — Z833 Family history of diabetes mellitus: Secondary | ICD-10-CM | POA: Insufficient documentation

## 2020-05-24 DIAGNOSIS — E119 Type 2 diabetes mellitus without complications: Secondary | ICD-10-CM | POA: Insufficient documentation

## 2020-05-24 DIAGNOSIS — Z794 Long term (current) use of insulin: Secondary | ICD-10-CM | POA: Diagnosis not present

## 2020-05-24 DIAGNOSIS — D4989 Neoplasm of unspecified behavior of other specified sites: Secondary | ICD-10-CM | POA: Diagnosis not present

## 2020-05-24 LAB — GLUCOSE, CAPILLARY
Glucose-Capillary: 148 mg/dL — ABNORMAL HIGH (ref 70–99)
Glucose-Capillary: 105 mg/dL — ABNORMAL HIGH (ref 70–99)

## 2020-05-24 MED ORDER — MIDAZOLAM HCL 2 MG/2ML IJ SOLN
INTRAMUSCULAR | Status: AC
  Filled 2020-05-24: qty 2

## 2020-05-24 MED ORDER — LIDOCAINE HCL (PF) 1 % IJ SOLN
INTRAMUSCULAR | Status: AC
  Filled 2020-05-24: qty 30

## 2020-05-24 MED ORDER — FENTANYL CITRATE (PF) 100 MCG/2ML IJ SOLN
INTRAMUSCULAR | Status: AC
  Filled 2020-05-24: qty 2

## 2020-05-24 MED ORDER — SODIUM CHLORIDE 0.9 % IV SOLN: INTRAVENOUS | Status: DC

## 2020-05-24 NOTE — H&P (Signed)
   Chief Complaint: Multiple Myeloma  Referring Physician(s): Ennever,Peter R  Supervising Physician: McCullough, Heath  Patient Status: [facility] - Out-pt  History of Present Illness: Vicki Moon is a 69 y.o. female with known Multiple myeloma not having achieved remission.  She is known to our service. She had a bone marrow biopsy by Dr. McCullough on 03/16/20.  She now with large mass in the right axilla/trapezius area.  We are asked to biopsy this to evaluate for plasmacytoma.  Multiple biopsies needed for molecular studies.   She tells me she has diminished ROM in the right shoulder.  She is NPO. No blood thinners. No nausea/vomiting. No Fever/chills. ROS negative.     Past Medical History:  Diagnosis Date  . Diabetes mellitus without complication (HCC)    Type 2 IDDM x 5 years  . 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 Glaucoma   . Goals of care, counseling/discussion 04/29/2019  . Herniated lumbar intervertebral disc 06/2014  . Multiple myeloma (HCC) 04/29/2019  . PONV (postoperative nausea and vomiting)     Past Surgical History:  Procedure Laterality Date  . CHOLECYSTECTOMY    . COLONOSCOPY N/A 06/28/2015   Procedure: COLONOSCOPY;  Surgeon: Najeeb U Rehman, MD;  Location: [facility];  Service: Endoscopy;  Laterality: N/A;  155  . EYE SURGERY    . IR BONE MARROW BIOPSY & ASPIRATION  03/16/2020  . IR BONE TUMOR(S)RF ABLATION  04/28/2019  . IR KYPHO LUMBAR INC FX REDUCE BONE BX UNI/BIL CANNULATION INC/IMAGING  04/28/2019  . IR RADIOLOGIST EVAL & MGMT  04/22/2019  . LUMBAR LAMINECTOMY Right 07/12/2014   Procedure: LUMBAR FIVE TO SACRAL ONE MICRODISCECTOMY;  Surgeon: Mark C Yates, MD;  Location: [facility] OR;  Service: Orthopedics;  Laterality: Right;  . TUBAL LIGATION    . VITRECTOMY 25 GAUGE WITH SCLERAL BUCKLE Left 07/29/2017   Procedure: RETINAL DETACHMENT REPAIR LEFT EYE WITH PARSPLANA VITRECTOMY, AIR FLUID EXCHANGE, ENDO LASTER, DRAINAGE OF SUBRETINAL  FLUID,ENDOLASER PPV /25 GAUGE;  Surgeon: Patel, Narendra, MD;  Location: [facility] OR;  Service: Ophthalmology;  Laterality: Left;    Allergies: Metformin  Medications: Prior to Admission medications   Medication Sig Start Date End Date Taking? Authorizing Provider  ALPRAZolam (XANAX) 0.5 MG tablet Take 0.5 mg by mouth at bedtime as needed for sleep.  05/10/19  Yes [provider]  Cholecalciferol (VITAMIN D3) 1.25 MG (50000 UT) CAPS Take 1 capsule by mouth every Sunday.  04/19/20  Yes [provider]  Continuous Blood Gluc Sensor (FREESTYLE LIBRE SENSOR SYSTEM) MISC Use one sensor every 10 days. 10/14/17  Yes Nida, Gebreselassie W, MD  FARXIGA 10 MG TABS tablet Take 10 mg by mouth daily. 04/18/20  Yes [provider]  HYDROcodone-acetaminophen (NORCO/VICODIN) 5-325 MG tablet Take 1 tablet by mouth every 6 (six) hours as needed for moderate pain. Received from [place] [facility].   Yes [provider]  Insulin Disposable Pump (OMNIPOD DASH SYSTEM) KIT 1 Device by Does not apply route 3 (three) times a week. Patient put on insulin pump every 2 to 3 days for insulin delivery   Yes [provider]  latanoprost (XALATAN) 0.005 % ophthalmic solution Place 1 drop into both eyes at bedtime.   Yes [provider]  NOVOLOG 100 UNIT/ML injection Inject 1.2-25 Units into the skin as directed. PATIENT USES INSULIN PUMP  Patient has a set basal rate of 1.2-1.5u per hour with meal time boluses of up to 25 units 05/06/19  Yes [provider]  PARoxetine (PAXIL) 30 MG tablet Take 30 mg by mouth every morning. 07/12/18  Yes [provider]  [place] dash-insulin pump.Changes every 2 days. 5-25units ac based on blood sugar.   Yes [provider]  temazepam (RESTORIL) 15 MG capsule Take 1 capsule (15 mg total) by mouth at bedtime as needed for sleep. 05/08/20  Yes [name], MD  venetoclax 100 MG TABS Take 200 mg by mouth  daily. 03/23/20  Yes Ennever, Rudell Cobb, MD  ondansetron (ZOFRAN) 8 MG tablet Take 1 tablet (8 mg total) by mouth every 8 (eight) hours as needed for nausea or vomiting. 11/30/19   [name], MD     Family History  Problem Relation Age of Onset  . Hypertension Mother   . Stroke Mother   . Cancer Father        brain tumor  . Diabetes Father     Social History   Socioeconomic History  . Marital status: Married    Spouse name: Not on file  . Number of children: Not on file  . Years of education: Not on file  . Highest education level: Not on file  Occupational History  . Not on file  Tobacco Use  . Smoking status: Current Some Day Smoker    Packs/day: 1.00    Years: 10.00    Pack years: 10.00    Types: Cigarettes    Last attempt to quit: 02/20/2019    Years since quitting: 1.2  . Smokeless tobacco: Never Used  . Tobacco comment: 1 pack/month   Substance and Sexual Activity  . Alcohol use: No    Alcohol/week: 0.0 standard drinks  . Drug use: No  . Sexual activity: Yes  Other Topics Concern  . Not on file  Social History Narrative  . Not on file   Social Determinants of Health   Financial Resource Strain:   . Difficulty of Paying Living Expenses:   Food Insecurity:   . Worried About Charity fundraiser in the Last Year:   . Arboriculturist in the Last Year:   Transportation Needs:   . Film/video editor (Medical):   Marland Kitchen Lack of Transportation (Non-Medical):   Physical Activity:   . Days of Exercise per Week:   . Minutes of Exercise per Session:   Stress:   . Feeling of Stress :   Social Connections:   . Frequency of Communication with Friends and Family:   . Frequency of Social Gatherings with Friends and Family:   . Attends Religious Services:   . Active Member of Clubs or Organizations:   . Attends Archivist Meetings:   Marland Kitchen Marital Status:      Review of Systems: A 12 point ROS discussed and pertinent positives are indicated in the HPI  above.  All other systems are negative.  Review of Systems  Vital Signs: BP (!) 138/54   Pulse 75   Temp 97.9 F (36.6 C) (Skin)   Resp 16   Ht 5' 2" (1.575 m)   Wt 70.8 kg   SpO2 100%   BMI 28.53 kg/m   Physical Exam Vitals reviewed.  Constitutional:      Appearance: Normal appearance.  HENT:     Head: Normocephalic and atraumatic.  Eyes:     Extraocular Movements: Extraocular movements intact.  Cardiovascular:     Rate and Rhythm: Normal rate and regular rhythm.  Pulmonary:     Effort:  Pulmonary effort is normal. No respiratory distress.     Breath sounds: Normal breath sounds.  Abdominal:     General: There is no distension.     Palpations: Abdomen is soft.     Tenderness: There is no abdominal tenderness.  Musculoskeletal:     Comments: Visible and palpable right axillary mass. Diminished ROM in right shoulder.  Skin:    General: Skin is warm and dry.  Neurological:     General: No focal deficit present.     Mental Status: She is alert and oriented to person, place, and time.  Psychiatric:        Mood and Affect: Mood normal.        Behavior: Behavior normal.        Thought Content: Thought content normal.        Judgment: Judgment normal.     Imaging: CT CHEST WO CONTRAST  Result Date: 05/08/2020 CLINICAL DATA:  Right axillary swelling and tenderness. History of multiple myeloma. EXAM: CT CHEST WITHOUT CONTRAST TECHNIQUE: Multidetector CT imaging of the chest was performed following the standard protocol without IV contrast. COMPARISON:  Chest radiographs dated 11/03/2019. Chest CTA dated 09/03/2019. FINDINGS: Cardiovascular: Atheromatous arterial calcifications, including the aorta. Normal sized heart. Mediastinum/Nodes: Interval multiple enlarged level right axillary lymph nodes, including level 1 and level 3 nodes. The largest node has a short axis diameter of 21 mm on image number 68 series 2. No enlarged intrathoracic, upper abdominal or left axillary  lymph nodes. Unremarkable thyroid gland and esophagus. Lungs/Pleura: 2 mm, normal sized subpleural node along the minor fissure on image number 78 series 3. Otherwise, no lung nodules are seen. No pleural fluid. Upper Abdomen: Cholecystectomy clips. Musculoskeletal: Interval large infiltrative mass within the musculature surrounding the mid and inferior portions of the scapula and extending inferiorly from there. On image number 47 series 2, this measures 13.3 x 8.9 cm in maximum dimensions. On coronal image number 96, this measures 14.6 cm in length. There is an adjacent mass superior to this mass posteriorly, adjacent to the posterior aspect of the scapula, measuring 2.8 x 2.7 cm on image number 12 series 2. There are multiple additional similar-appearing, smaller nodules in the subcutaneous fat posterolateral to the inferior aspect of the large mass surrounding the scapula. Also demonstrated are lytic changes in the inferior aspect of the scapula, best seen on image number 58 series 2. Interval large number of poorly defined sclerotic lesions throughout the thoracic and lower cervical spine, sternum and manubrium. Suggestion of subtle, similar changes in the ribs bilaterally. Interval left paraspinal mass in the lower thoracic spine, measuring 4.4 x 1.6 cm on image number 120 series 2 and 4.7 cm in length on coronal image number 100 series 5. No lytic changes in the adjacent bones. IMPRESSION: 1. Interval large infiltrative mass within the musculature surrounding the mid and inferior portions of the scapula and extending inferiorly from there with multiple adjacent masses and associated lytic changes in the inferior scapula, as described above. Differential considerations include multiple myeloma spreading into the adjacent tissues and metastatic disease. 2. Interval 4.7 x 4.4 x 1.6 cm left lower paraspinal mass. Differential considerations include metastatic disease, direct extension from nonvisualized myeloma  involving the adjacent spine and extra medullary hematopoiesis. 1 3. Interval large number of poorly defined sclerotic lesions throughout the thoracic and lower cervical spine, sternum and manubrium, and probable bilateral ribs. This is an atypical appearance for multiple myeloma and is concerning for sclerotic metastases. This  can be seen with breast carcinoma, possibly arising from the right breast, based on the unilateralality of the findings. 4. Interval multiple enlarged level 1 and level 3 right axillary lymph nodes, compatible with metastatic adenopathy. Aortic Atherosclerosis (ICD10-I70.0). Electronically Signed   By: Claudie Revering M.D.   On: 05/08/2020 15:10    Labs:  CBC: Recent Labs    04/09/20 1328 04/16/20 1417 04/19/20 1300 05/08/20 1303  WBC 7.7 2.4* 2.3* 3.3*  HGB 9.8* 9.9* 9.1* 9.1*  HCT 30.1* 30.8* 27.8* 28.6*  PLT 33* 101* 117* 175    COAGS: No results for input(s): INR, APTT in the last 8760 hours.  BMP: Recent Labs    04/09/20 1328 04/16/20 1417 04/19/20 1300 05/08/20 1303  NA 137 138 140 141  K 3.6 4.2 3.6 3.4*  CL 104 105 108 108  CO2 25 21* 26 26  GLUCOSE 289* 259* 152* 168*  BUN _0 CALCIUM 9.0 8.8* 8.9 9.1  CREATININE 0.66 0.73 0.70 0.79  GFRNONAA >60 >60 >60 >60  GFRAA >60 >60 >60 >60    LIVER FUNCTION TESTS: Recent Labs    04/09/20 1328 04/16/20 1417 04/19/20 1300 05/08/20 1303  BILITOT 0.5 0.5 0.4 0.4  AST _1 ALT _2 ALKPHOS 153* 182* 187* 169*  PROT 7.2 7.7 7.2 6.6  ALBUMIN 2.9* 3.3* 3.7 3.6    TUMOR MARKERS: No results for input(s): AFPTM, CEA, CA199, CHROMGRNA in the last 8760 hours.  Assessment and Plan:  Right axillary/trapezius palpable mass in the setting of multiple myeloma.  Will proceed with image guided biopsy today by Dr. Laurence Ferrari.  Risks and benefits of biopsy was discussed with the patient and/or patient's family including, but not limited to bleeding, infection, damage to adjacent  structures or low yield requiring additional tests.  All of the questions were answered and there is agreement to proceed.  Consent signed and in chart.  Thank you for this interesting consult.  I greatly enjoyed meeting Vicki Moon and look forward to participating in their care.  A copy of this report was sent to the requesting provider on this date.  Electronically Signed: Murrell Redden, PA-C   05/24/2020, 11:37 AM      I spent a total of  25 Minutes in face to face in clinical consultation, greater than 50% of which was counseling/coordinating care for axillary mass biopsy.

## 2020-05-24 NOTE — Procedures (Signed)
Interventional Radiology Procedure Note  Procedure: US guided core biopsy of right axillary nodal mass.   Complications: None  Estimated Blood Loss: None  Recommendations: - US guided core biopsy - DC home   Signed,  Criselda Peaches, MD

## 2020-05-24 NOTE — Discharge Instructions (Signed)

## 2020-05-25 LAB — SURGICAL PATHOLOGY

## 2020-05-28 ENCOUNTER — Other Ambulatory Visit: Payer: Self-pay | Admitting: *Deleted

## 2020-05-28 ENCOUNTER — Other Ambulatory Visit: Payer: Medicare Other

## 2020-05-28 ENCOUNTER — Encounter: Payer: Self-pay | Admitting: Hematology & Oncology

## 2020-05-28 ENCOUNTER — Other Ambulatory Visit: Payer: Self-pay

## 2020-05-28 ENCOUNTER — Ambulatory Visit: Payer: Medicare Other | Admitting: Hematology & Oncology

## 2020-05-28 ENCOUNTER — Inpatient Hospital Stay: Payer: Medicare Other

## 2020-05-28 ENCOUNTER — Inpatient Hospital Stay (HOSPITAL_BASED_OUTPATIENT_CLINIC_OR_DEPARTMENT_OTHER): Payer: Medicare Other | Admitting: Hematology & Oncology

## 2020-05-28 ENCOUNTER — Inpatient Hospital Stay: Payer: Medicare Other | Attending: Hematology & Oncology

## 2020-05-28 VITALS — BP 123/46 | HR 79 | Temp 96.8°F | Resp 16

## 2020-05-28 DIAGNOSIS — C9002 Multiple myeloma in relapse: Secondary | ICD-10-CM

## 2020-05-28 DIAGNOSIS — E119 Type 2 diabetes mellitus without complications: Secondary | ICD-10-CM | POA: Diagnosis not present

## 2020-05-28 DIAGNOSIS — E876 Hypokalemia: Secondary | ICD-10-CM

## 2020-05-28 DIAGNOSIS — M62838 Other muscle spasm: Secondary | ICD-10-CM | POA: Insufficient documentation

## 2020-05-28 DIAGNOSIS — D696 Thrombocytopenia, unspecified: Secondary | ICD-10-CM

## 2020-05-28 DIAGNOSIS — C9000 Multiple myeloma not having achieved remission: Secondary | ICD-10-CM | POA: Diagnosis not present

## 2020-05-28 DIAGNOSIS — Z79899 Other long term (current) drug therapy: Secondary | ICD-10-CM | POA: Diagnosis not present

## 2020-05-28 LAB — CMP (CANCER CENTER ONLY)
ALT: 13 U/L (ref 0–44)
AST: 18 U/L (ref 15–41)
Albumin: 3.6 g/dL (ref 3.5–5.0)
Alkaline Phosphatase: 112 U/L (ref 38–126)
Anion gap: 7 (ref 5–15)
BUN: 11 mg/dL (ref 8–23)
CO2: 26 mmol/L (ref 22–32)
Calcium: 9.5 mg/dL (ref 8.9–10.3)
Chloride: 104 mmol/L (ref 98–111)
Creatinine: 0.79 mg/dL (ref 0.44–1.00)
GFR, Est AFR Am: >60 mL/min (ref >60)
GFR, Estimated: >60 mL/min (ref >60)
Glucose, Bld: 202 mg/dL — ABNORMAL HIGH (ref 70–99)
Potassium: 3.6 mmol/L (ref 3.5–5.1)
Sodium: 137 mmol/L (ref 135–145)
Total Bilirubin: 0.4 mg/dL (ref 0.3–1.2)
Total Protein: 8 g/dL (ref 6.5–8.1)

## 2020-05-28 LAB — CBC WITH DIFFERENTIAL (CANCER CENTER ONLY)
Abs Immature Granulocytes: 0.01 10*3/uL (ref 0.00–0.07)
Basophils Absolute: 0 10*3/uL (ref 0.0–0.1)
Basophils Relative: 0 %
Eosinophils Absolute: 0 10*3/uL (ref 0.0–0.5)
Eosinophils Relative: 0 %
HCT: 30 % — ABNORMAL LOW (ref 36.0–46.0)
Hemoglobin: 9.7 g/dL — ABNORMAL LOW (ref 12.0–15.0)
Immature Granulocytes: 0 %
Lymphocytes Relative: 12 %
Lymphs Abs: 0.4 10*3/uL — ABNORMAL LOW (ref 0.7–4.0)
MCH: 32.1 pg (ref 26.0–34.0)
MCHC: 32.3 g/dL (ref 30.0–36.0)
MCV: 99.3 fL (ref 80.0–100.0)
Monocytes Absolute: 0.3 10*3/uL (ref 0.1–1.0)
Monocytes Relative: 10 %
Neutro Abs: 2.5 10*3/uL (ref 1.7–7.7)
Neutrophils Relative %: 78 %
Platelet Count: 190 10*3/uL (ref 150–400)
RBC: 3.02 MIL/uL — ABNORMAL LOW (ref 3.87–5.11)
RDW: 18.6 % — ABNORMAL HIGH (ref 11.5–15.5)
WBC Count: 3.3 10*3/uL — ABNORMAL LOW (ref 4.0–10.5)
nRBC: 0 % (ref 0.0–0.2)

## 2020-05-28 MED ORDER — DENOSUMAB 120 MG/1.7ML ~~LOC~~ SOLN
120.0000 mg | Freq: Once | SUBCUTANEOUS | Status: AC
  Administered 2020-05-28: 120 mg via SUBCUTANEOUS

## 2020-05-28 MED ORDER — TBO-FILGRASTIM 480 MCG/0.8ML ~~LOC~~ SOSY: 480.0000 ug | PREFILLED_SYRINGE | Freq: Once | SUBCUTANEOUS | Status: DC

## 2020-05-28 MED ORDER — CYCLOBENZAPRINE HCL 10 MG PO TABS: 10.0000 mg | ORAL_TABLET | Freq: Three times a day (TID) | ORAL | 1 refills | Status: DC | PRN

## 2020-05-28 MED ORDER — DENOSUMAB 120 MG/1.7ML ~~LOC~~ SOLN
SUBCUTANEOUS | Status: AC
  Filled 2020-05-28: qty 1.7

## 2020-05-28 MED ORDER — FENTANYL 12 MCG/HR TD PT72: 1.0000 | MEDICATED_PATCH | TRANSDERMAL | 0 refills | Status: DC

## 2020-05-28 MED ORDER — SODIUM CHLORIDE 0.9 % IV SOLN
Freq: Once | INTRAVENOUS | Status: DC
  Filled 2020-05-28: qty 250

## 2020-05-28 NOTE — Patient Instructions (Signed)
Denosumab injection What is this medicine? DENOSUMAB (den oh sue mab) slows bone breakdown. Prolia is used to treat osteoporosis in women after menopause and in men, and in people who are taking corticosteroids for 6 months or more. Xgeva is used to treat a high calcium level due to cancer and to prevent bone fractures and other bone problems caused by multiple myeloma or cancer bone metastases. Xgeva is also used to treat giant cell tumor of the bone. This medicine may be used for other purposes; ask your health care provider or pharmacist if you have questions. COMMON BRAND NAME(S): Prolia, XGEVA What should I tell my health care provider before I take this medicine? They need to know if you have any of these conditions:  dental disease  having surgery or tooth extraction  infection  kidney disease  low levels of calcium or Vitamin D in the blood  malnutrition  on hemodialysis  skin conditions or sensitivity  thyroid or parathyroid disease  an unusual reaction to denosumab, other medicines, foods, dyes, or preservatives  pregnant or trying to get pregnant  breast-feeding How should I use this medicine? This medicine is for injection under the skin. It is given by a health care professional in a hospital or clinic setting. A special MedGuide will be given to you before each treatment. Be sure to read this information carefully each time. For Prolia, talk to your pediatrician regarding the use of this medicine in children. Special care may be needed. For Xgeva, talk to your pediatrician regarding the use of this medicine in children. While this drug may be prescribed for children as young as 13 years for selected conditions, precautions do apply. Overdosage: If you think you have taken too much of this medicine contact a poison control center or emergency room at once. NOTE: This medicine is only for you. Do not share this medicine with others. What if I miss a dose? It is  important not to miss your dose. Call your doctor or health care professional if you are unable to keep an appointment. What may interact with this medicine? Do not take this medicine with any of the following medications:  other medicines containing denosumab This medicine may also interact with the following medications:  medicines that lower your chance of fighting infection  steroid medicines like prednisone or cortisone This list may not describe all possible interactions. Give your health care provider a list of all the medicines, herbs, non-prescription drugs, or dietary supplements you use. Also tell them if you smoke, drink alcohol, or use illegal drugs. Some items may interact with your medicine. What should I watch for while using this medicine? Visit your doctor or health care professional for regular checks on your progress. Your doctor or health care professional may order blood tests and other tests to see how you are doing. Call your doctor or health care professional for advice if you get a fever, chills or sore throat, or other symptoms of a cold or flu. Do not treat yourself. This drug may decrease your body's ability to fight infection. Try to avoid being around people who are sick. You should make sure you get enough calcium and vitamin D while you are taking this medicine, unless your doctor tells you not to. Discuss the foods you eat and the vitamins you take with your health care professional. See your dentist regularly. Brush and floss your teeth as directed. Before you have any dental work done, tell your dentist you are   receiving this medicine. Do not become pregnant while taking this medicine or for 5 months after stopping it. Talk with your doctor or health care professional about your birth control options while taking this medicine. Women should inform their doctor if they wish to become pregnant or think they might be pregnant. There is a potential for serious side  effects to an unborn child. Talk to your health care professional or pharmacist for more information. What side effects may I notice from receiving this medicine? Side effects that you should report to your doctor or health care professional as soon as possible:  allergic reactions like skin rash, itching or hives, swelling of the face, lips, or tongue  bone pain  breathing problems  dizziness  jaw pain, especially after dental work  redness, blistering, peeling of the skin  signs and symptoms of infection like fever or chills; cough; sore throat; pain or trouble passing urine  signs of low calcium like fast heartbeat, muscle cramps or muscle pain; pain, tingling, numbness in the hands or feet; seizures  unusual bleeding or bruising  unusually weak or tired Side effects that usually do not require medical attention (report to your doctor or health care professional if they continue or are bothersome):  constipation  diarrhea  headache  joint pain  loss of appetite  muscle pain  runny nose  tiredness  upset stomach This list may not describe all possible side effects. Call your doctor for medical advice about side effects. You may report side effects to FDA at 1-800-FDA-1088. Where should I keep my medicine? This medicine is only given in a clinic, doctor's office, or other health care setting and will not be stored at home. NOTE: This sheet is a summary. It may not cover all possible information. If you have questions about this medicine, talk to your doctor, pharmacist, or health care provider.  2020 Elsevier/Gold Standard (2018-04-16 16:10:44)

## 2020-05-28 NOTE — Progress Notes (Signed)
Patient received Zometa 4 mg 03/22/20 while hospitalized. Dr. [name] would like her receive Xgeva 120 mg today for bone lesions.

## 2020-05-28 NOTE — Progress Notes (Signed)
Hematology and Oncology Follow Up Visit  Vicki Moon [id number] Oct 07, 1951 69 y.o. 05/28/2020   Principle Diagnosis:  IgG Kappa myeloma -- 1p-, 13q-, 16q- and t(11:14)  Past Therapy: RVD -- start on 05/09/2019 -- d/c revlimid on 06/14/2019 -- d/c on 06/28/2019 S/P Kyphoplasty at L2 XRT to lumbar spine -- completed in [facility] -- startedon 07/12/2019- s/p cycle4 -- d/c on 10/19/2019 due to disease progression  Current Therapy:   Selinexor/Velcade -- start cycle #1 on 02/06/2020 - d/c on 03/03 Kyprolis/Faspro -- start on 10/26/2019 -- s/p cycle #4 -- d/c due to progression Xgeva 120 mg sq q 3 months -- next dose on09/2021 Venetoclax 200 mg po q day -- started on 03/30/2020 XRT for extramedullary plasmacytoma -- start on 05/30/2020   Interim History:  Vicki Moon is here today for follow-up. Unfortunately, his certainly looks like we run into issues with the myeloma. We did get a biopsy of the right axillary mass. The biopsy was done on 05/24/2020. The pathology report ([id number]) shows plasma cell neoplasm consistent with Vicki Moon myeloma.  I suppose this really is no surprise.  We spoke to radiation oncology up in [place] at the [facility]. They will see Vicki Moon this week and try to get radiation therapy started.  She says Vicki Moon right shoulder is not hurting Vicki Moon as much. What is bothering Vicki Moon now is the left side. This is under the rib cage. I suspect this probably is from the plasmacytoma that might be paraspinal.  We really cannot give Vicki Moon steroids because of Vicki Moon diabetes as this is quite "fragile."  She is still doing okay on the venetoclax. I have to believe that this is helping Vicki Moon because Vicki Moon platelet count continues to go up.  She is doing everything we have asked Vicki Moon to do. Para she is having the pain issues. I will try Vicki Moon on a Duragesic patch at 12 mcg. I will also try Vicki Moon on some Flexeril to try to help with any kind of muscle spasms.. She is  having no problems with pain or weakness in Vicki Moon legs. There is no bowel or bladder issues. She seems to be eating okay without any nausea or vomiting. She may have one episode of vomiting.  I would have to say that currently, Vicki Moon performance status is ECOG 2.    Medications:  Allergies as of 05/28/2020      Reactions   Metformin Diarrhea, Nausea Only      Medication List       Accurate as of May 28, 2020  2:07 PM. If you have any questions, ask your nurse or doctor.        ALPRAZolam 0.5 MG tablet Commonly known as: XANAX Take 0.5 mg by mouth at bedtime as needed for sleep.   Farxiga 10 MG Tabs tablet Generic drug: dapagliflozin propanediol Take 10 mg by mouth daily.   FreeStyle [employer] Use one sensor every 10 days.   HYDROcodone-acetaminophen 5-325 MG tablet Commonly known as: NORCO/VICODIN Take 1 tablet by mouth every 6 (six) hours as needed for moderate pain. Received from [facility] ED.   latanoprost 0.005 % ophthalmic solution Commonly known as: XALATAN Place 1 drop into both eyes at bedtime.   NovoLOG 100 UNIT/ML injection Generic drug: insulin aspart Inject 1.2-25 Units into the skin as directed. PATIENT USES INSULIN PUMP  Patient has a set basal rate of 1.2-1.5u per hour with meal time boluses of up to 25 units  OmniPod Dash System Kit 1 Device by Does not apply route 3 (three) times a week. Patient put on insulin pump every 2 to 3 days for insulin delivery   ondansetron 8 MG tablet Commonly known as: ZOFRAN Take 1 tablet (8 mg total) by mouth every 8 (eight) hours as needed for nausea or vomiting.   PARoxetine 30 MG tablet Commonly known as: PAXIL Take 30 mg by mouth every morning.   PRESCRIPTION MEDICATION Onmipod dash-insulin pump.Changes every 2 days. 5-25units ac based on blood sugar.   temazepam 15 MG capsule Commonly known as: RESTORIL Take 1 capsule (15 mg total) by mouth at bedtime as needed for sleep.   venetoclax 100 MG  Tabs Take 200 mg by mouth daily.   Vitamin D3 1.25 MG (50000 UT) Caps Take 1 capsule by mouth every Sunday.       Allergies:  Allergies  Allergen Reactions  . Metformin Diarrhea and Nausea Only    Past Medical History, Surgical history, Social history, and Family History were reviewed and updated.  Review of Systems: Review of Systems  Constitutional: Negative.   HENT: Negative.   Eyes: Negative.   Respiratory: Negative.   Cardiovascular: Negative.   Gastrointestinal: Negative.   Genitourinary: Negative.   Musculoskeletal: Negative.   Skin: Negative.   Neurological: Negative.   Endo/Heme/Allergies: Negative.   Psychiatric/Behavioral: Negative.      Physical Exam:  temporal temperature is 96.8 F (36 C) (abnormal). Vicki Moon blood pressure is 123/46 (abnormal) and Vicki Moon pulse is 79. Vicki Moon respiration is 16 and oxygen saturation is 100%.   Wt Readings from Last 3 Encounters:  05/24/20 156 lb (70.8 kg)  05/11/20 159 lb (72.1 kg)  05/08/20 159 lb 6.4 oz (72.3 kg)    Physical Exam Vitals reviewed.  HENT:     Head: Normocephalic and atraumatic.  Eyes:     Pupils: Pupils are equal, round, and reactive to light.  Cardiovascular:     Rate and Rhythm: Normal rate and regular rhythm.     Heart sounds: Normal heart sounds.  Pulmonary:     Effort: Pulmonary effort is normal.     Breath sounds: Normal breath sounds.  Abdominal:     General: Bowel sounds are normal.     Palpations: Abdomen is soft.  Musculoskeletal:        General: No tenderness or deformity. Normal range of motion.     Cervical back: Normal range of motion.     Comments: Vicki Moon extremities, she does have the mass in the lateral aspect of the right shoulder area. This is almost post axillary. It is firm. It is slightly tender. There is no warmth.  Lymphadenopathy:     Cervical: No cervical adenopathy.  Skin:    General: Skin is warm and dry.     Findings: No erythema or rash.  Neurological:     Mental Status:  She is alert and oriented to person, place, and time.  Psychiatric:        Behavior: Behavior normal.        Thought Content: Thought content normal.        Judgment: Judgment normal.      Lab Results  Component Value Date   WBC 3.3 (L) 05/28/2020   HGB 9.7 (L) 05/28/2020   HCT 30.0 (L) 05/28/2020   MCV 99.3 05/28/2020   PLT 190 05/28/2020   Lab Results  Component Value Date   FERRITIN 1,339 (H) 03/13/2020   IRON 101 03/13/2020   TIBC  194 (L) 03/13/2020   UIBC 92 (L) 03/13/2020   IRONPCTSAT 52 03/13/2020   Lab Results  Component Value Date   RETICCTPCT 1.2 02/15/2020   RBC 3.02 (L) 05/28/2020   Lab Results  Component Value Date   KPAFRELGTCHN 212.9 (H) 05/08/2020   LAMBDASER 6.6 05/08/2020   KAPLAMBRATIO 32.26 (H) 05/08/2020   Lab Results  Component Value Date   IGGSERUM 2,194 (H) 05/08/2020   IGGSERUM 2,342 (H) 05/08/2020   IGA 9 (L) 05/08/2020   IGA 11 (L) 05/08/2020   IGMSERUM 17 (L) 05/08/2020   IGMSERUM 28 05/08/2020   Lab Results  Component Value Date   TOTALPROTELP 7.0 05/08/2020   ALBUMINELP 3.1 05/08/2020   A1GS 0.3 05/08/2020   A2GS 0.8 05/08/2020   BETS 0.9 05/08/2020   GAMS 1.9 (H) 05/08/2020   MSPIKE 1.6 (H) 05/08/2020   SPEI Comment 03/23/2020     Chemistry      Component Value Date/Time   NA 137 05/28/2020 1325   K 3.6 05/28/2020 1325   CL 104 05/28/2020 1325   CO2 26 05/28/2020 1325   BUN 11 05/28/2020 1325   CREATININE 0.79 05/28/2020 1325   CREATININE 0.65 01/12/2018 1004      Component Value Date/Time   CALCIUM 9.5 05/28/2020 1325   ALKPHOS 112 05/28/2020 1325   AST 18 05/28/2020 1325   ALT 13 05/28/2020 1325   BILITOT 0.4 05/28/2020 1325       Impression and Plan: Ms. Knouff is a very pleasant 69 yo caucasian female with IgG kappa myeloma, with what I would consider to have high risk cytogenetics.  I feel regarding need to get radiation therapy to the areas of involvement. I think this would give [place] the best chance  of trying to get these areas shrinking so that she will not have as much discomfort.  I know that the Radiation Oncologist at the [facility] will do a great job. The have seen Vicki Moon previously for past radiation therapy to the lower back for compression fracture from myeloma.  We will see what Vicki Moon myeloma levels look like. I would like to hope that she is responding.  She does have the t(11:14) translocation which should respond to the venetoclax. However she has a few other chromosome abnormalities which might be a problem.  I hope that the radiation will start this week.  We will try to get Vicki Moon back in 3 weeks. By then, she should be done with Vicki Moon radiation.   [name], MD 6/7/20212:07 PM

## 2020-05-29 DIAGNOSIS — C9002 Multiple myeloma in relapse: Secondary | ICD-10-CM | POA: Diagnosis not present

## 2020-05-29 DIAGNOSIS — C7951 Secondary malignant neoplasm of bone: Secondary | ICD-10-CM | POA: Diagnosis not present

## 2020-05-29 DIAGNOSIS — C9000 Multiple myeloma not having achieved remission: Secondary | ICD-10-CM | POA: Diagnosis not present

## 2020-05-29 LAB — KAPPA/LAMBDA LIGHT CHAINS
Kappa free light chain: 365.8 mg/L — ABNORMAL HIGH (ref 3.3–19.4)
Kappa, lambda light chain ratio: 114.31 — ABNORMAL HIGH (ref 0.26–1.65)
Lambda free light chains: 3.2 mg/L — ABNORMAL LOW (ref 5.7–26.3)

## 2020-05-29 LAB — IGG, IGA, IGM
IgA: 8 mg/dL — ABNORMAL LOW (ref 64–422)
IgG (Immunoglobin G), Serum: 2744 mg/dL — ABNORMAL HIGH (ref 586–1602)
IgM (Immunoglobulin M), Srm: 14 mg/dL — ABNORMAL LOW (ref 26–217)

## 2020-05-29 LAB — SAMPLE TO BLOOD BANK

## 2020-05-30 DIAGNOSIS — C7951 Secondary malignant neoplasm of bone: Secondary | ICD-10-CM | POA: Diagnosis not present

## 2020-05-30 DIAGNOSIS — C9000 Multiple myeloma not having achieved remission: Secondary | ICD-10-CM | POA: Diagnosis not present

## 2020-05-31 DIAGNOSIS — C7951 Secondary malignant neoplasm of bone: Secondary | ICD-10-CM | POA: Diagnosis not present

## 2020-05-31 DIAGNOSIS — C9000 Multiple myeloma not having achieved remission: Secondary | ICD-10-CM | POA: Diagnosis not present

## 2020-05-31 LAB — IMMUNOFIXATION REFLEX, SERUM
IgA: 8 mg/dL — ABNORMAL LOW (ref 64–422)
IgG (Immunoglobin G), Serum: 3221 mg/dL — ABNORMAL HIGH (ref 586–1602)
IgM (Immunoglobulin M), Srm: 14 mg/dL — ABNORMAL LOW (ref 26–217)

## 2020-05-31 LAB — PROTEIN ELECTROPHORESIS, SERUM, WITH REFLEX
A/G Ratio: 0.8 (ref 0.7–1.7)
Albumin ELP: 3.4 g/dL (ref 2.9–4.4)
Alpha-1-Globulin: 0.3 g/dL (ref 0.0–0.4)
Alpha-2-Globulin: 0.9 g/dL (ref 0.4–1.0)
Beta Globulin: 0.9 g/dL (ref 0.7–1.3)
Gamma Globulin: 2.3 g/dL — ABNORMAL HIGH (ref 0.4–1.8)
Globulin, Total: 4.3 g/dL — ABNORMAL HIGH (ref 2.2–3.9)
M-Spike, %: 2.2 g/dL — ABNORMAL HIGH
SPEP Interpretation: 0
Total Protein ELP: 7.7 g/dL (ref 6.0–8.5)

## 2020-06-05 ENCOUNTER — Inpatient Hospital Stay: Payer: Medicare Other

## 2020-06-05 ENCOUNTER — Inpatient Hospital Stay: Payer: Medicare Other | Admitting: Hematology & Oncology

## 2020-06-07 DIAGNOSIS — C7951 Secondary malignant neoplasm of bone: Secondary | ICD-10-CM | POA: Diagnosis not present

## 2020-06-07 DIAGNOSIS — C9000 Multiple myeloma not having achieved remission: Secondary | ICD-10-CM | POA: Diagnosis not present

## 2020-06-11 ENCOUNTER — Other Ambulatory Visit: Payer: Self-pay | Admitting: *Deleted

## 2020-06-11 DIAGNOSIS — M8448XA Pathological fracture, other site, initial encounter for fracture: Secondary | ICD-10-CM

## 2020-06-11 DIAGNOSIS — C9002 Multiple myeloma in relapse: Secondary | ICD-10-CM

## 2020-06-11 DIAGNOSIS — C9000 Multiple myeloma not having achieved remission: Secondary | ICD-10-CM

## 2020-06-11 MED ORDER — HYDROCODONE-ACETAMINOPHEN 10-325 MG PO TABS: 1.0000 | ORAL_TABLET | Freq: Four times a day (QID) | ORAL | 0 refills | Status: DC | PRN

## 2020-06-11 NOTE — Telephone Encounter (Signed)
I called patient to check on her radiation treatments. Patient stated,"I'm having a problem when I lie down at night. The whole left side of my body is hurting, especially around the bottom of my rib cage. I can feel a lump around in there. Also, the Fentanyl patch and Hydrocodone aren't touching my pain level."  Per Dr. [name], he is going to prescribe Norco 10/325 po take one tablet every six hours as needed. Go ahead and double up on the [facility] patches. So, tomorrow, she will change the patch and place two 12 mcg patches on. She verbalized understanding.

## 2020-06-12 ENCOUNTER — Telehealth: Payer: Self-pay | Admitting: *Deleted

## 2020-06-12 NOTE — Telephone Encounter (Signed)
Call received from [place] at [facility] yesterday to inform Dr. [name] that pt is in excruciating pain when lying down for XRT treatments d/t new pain in left flank area.  Order received from Dr. [name] for pt to double Fentanyl patch to 25 mcg and to take Norco one tablet every six hours as needed for pain.  Call received again today from [place] to inform Dr. [name] that pt.'s pain is slightly better, except when pt lies down for XRT and that pt will be simulated today for new treatment tomorrow to patient's left flank.  Dr. [name] notified and order received for pt to take two Norco's prior to XRT treatments. Call placed to patient to notify her to take two Norco's one hour prior to XRT appt tomorrow per order of Dr. [name].  Pt states that her pain is better since she increased her Fentanyl patch to 25 mcg and is appreciative of call.

## 2020-06-21 ENCOUNTER — Inpatient Hospital Stay (HOSPITAL_BASED_OUTPATIENT_CLINIC_OR_DEPARTMENT_OTHER): Payer: Medicare Other | Admitting: Hematology & Oncology

## 2020-06-21 ENCOUNTER — Inpatient Hospital Stay: Payer: Medicare Other | Attending: Hematology & Oncology

## 2020-06-21 ENCOUNTER — Other Ambulatory Visit: Payer: Self-pay

## 2020-06-21 ENCOUNTER — Encounter: Payer: Self-pay | Admitting: Hematology & Oncology

## 2020-06-21 VITALS — BP 133/57 | HR 79 | Temp 96.6°F | Resp 19 | Wt 155.0 lb

## 2020-06-21 DIAGNOSIS — J90 Pleural effusion, not elsewhere classified: Secondary | ICD-10-CM | POA: Diagnosis not present

## 2020-06-21 DIAGNOSIS — Z79899 Other long term (current) drug therapy: Secondary | ICD-10-CM | POA: Diagnosis not present

## 2020-06-21 DIAGNOSIS — Z923 Personal history of irradiation: Secondary | ICD-10-CM | POA: Diagnosis not present

## 2020-06-21 DIAGNOSIS — R590 Localized enlarged lymph nodes: Secondary | ICD-10-CM | POA: Insufficient documentation

## 2020-06-21 DIAGNOSIS — C9000 Multiple myeloma not having achieved remission: Secondary | ICD-10-CM

## 2020-06-21 DIAGNOSIS — R634 Abnormal weight loss: Secondary | ICD-10-CM | POA: Insufficient documentation

## 2020-06-21 DIAGNOSIS — Z510 Encounter for antineoplastic radiation therapy: Secondary | ICD-10-CM | POA: Diagnosis not present

## 2020-06-21 LAB — CBC WITH DIFFERENTIAL (CANCER CENTER ONLY)
Abs Immature Granulocytes: 0.01 10*3/uL (ref 0.00–0.07)
Basophils Absolute: 0 10*3/uL (ref 0.0–0.1)
Basophils Relative: 0 %
Eosinophils Absolute: 0 10*3/uL (ref 0.0–0.5)
Eosinophils Relative: 0 %
HCT: 31.5 % — ABNORMAL LOW (ref 36.0–46.0)
Hemoglobin: 10.2 g/dL — ABNORMAL LOW (ref 12.0–15.0)
Immature Granulocytes: 0 %
Lymphocytes Relative: 7 %
Lymphs Abs: 0.2 10*3/uL — ABNORMAL LOW (ref 0.7–4.0)
MCH: 31.5 pg (ref 26.0–34.0)
MCHC: 32.4 g/dL (ref 30.0–36.0)
MCV: 97.2 fL (ref 80.0–100.0)
Monocytes Absolute: 0.4 10*3/uL (ref 0.1–1.0)
Monocytes Relative: 14 %
Neutro Abs: 2.2 10*3/uL (ref 1.7–7.7)
Neutrophils Relative %: 79 %
Platelet Count: 210 10*3/uL (ref 150–400)
RBC: 3.24 MIL/uL — ABNORMAL LOW (ref 3.87–5.11)
RDW: 15.8 % — ABNORMAL HIGH (ref 11.5–15.5)
WBC Count: 2.8 10*3/uL — ABNORMAL LOW (ref 4.0–10.5)
nRBC: 0 % (ref 0.0–0.2)

## 2020-06-21 LAB — SAMPLE TO BLOOD BANK

## 2020-06-21 LAB — CMP (CANCER CENTER ONLY)
ALT: 10 U/L (ref 0–44)
AST: 18 U/L (ref 15–41)
Albumin: 3.5 g/dL (ref 3.5–5.0)
Alkaline Phosphatase: 110 U/L (ref 38–126)
Anion gap: 8 (ref 5–15)
BUN: 17 mg/dL (ref 8–23)
CO2: 23 mmol/L (ref 22–32)
Calcium: 9 mg/dL (ref 8.9–10.3)
Chloride: 106 mmol/L (ref 98–111)
Creatinine: 1.09 mg/dL — ABNORMAL HIGH (ref 0.44–1.00)
GFR, Est AFR Am: 59 mL/min — ABNORMAL LOW (ref >60)
GFR, Estimated: 51 mL/min — ABNORMAL LOW (ref >60)
Glucose, Bld: 132 mg/dL — ABNORMAL HIGH (ref 70–99)
Potassium: 3.5 mmol/L (ref 3.5–5.1)
Sodium: 137 mmol/L (ref 135–145)
Total Bilirubin: 0.4 mg/dL (ref 0.3–1.2)
Total Protein: 8.2 g/dL — ABNORMAL HIGH (ref 6.5–8.1)

## 2020-06-21 LAB — SAVE SMEAR(SSMR), FOR PROVIDER SLIDE REVIEW

## 2020-06-21 MED ORDER — FENTANYL 25 MCG/HR TD PT72: 1.0000 | MEDICATED_PATCH | TRANSDERMAL | 0 refills | Status: DC

## 2020-06-21 MED ORDER — OXYCODONE-IBUPROFEN 5-400 MG PO TABS: 1.0000 | ORAL_TABLET | Freq: Four times a day (QID) | ORAL | 0 refills | Status: DC | PRN

## 2020-06-21 NOTE — Progress Notes (Signed)
Hematology and Oncology Follow Up Visit  Vicki Moon [id number] 03-10-1951 69 y.o. 06/21/2020   Principle Diagnosis:  IgG Kappa myeloma -- 1p-, 13q-, 16q- and t(11:14)  Past Therapy: RVD -- start on 05/09/2019 -- d/c revlimid on 06/14/2019 -- d/c on 06/28/2019 S/P Kyphoplasty at L2 XRT to lumbar spine -- completed in [facility] -- startedon 07/12/2019- s/p cycle4 -- d/c on 10/19/2019 due to disease progression  Current Therapy:   Selinexor/Velcade -- start cycle #1 on 02/06/2020 - d/c on 03/03 Kyprolis/Faspro -- start on 10/26/2019 -- s/p cycle #4 -- d/c due to progression Xgeva 120 mg sq q 3 months -- next dose on09/2021 Venetoclax 200 mg po q day -- started on 03/30/2020 -- d/c on 06/14/2020 Melflufen 40 mg iv q month -- start on 07/03/2020 XRT for extramedullary plasmacytoma -- start on 05/30/2020   Interim History:  Vicki Moon is here today for follow-up.  As always, she comes in in a wheelchair.  She is just having a tough time.  She will finish radiation therapy for her plasmacytomas.  She thinks that the radiation is helping the plasmacytoma associated with the right shoulder.  He does not feel as firm over that way.  Her pain is mostly in the left rib cage at about the sixth rib.  She will finish radiation to this area in 1 day.  It is, unfortunately, obvious that her myeloma is still progressing.  Her last M spike was up to 2.2 g/dL.  The IgG was 3221 mg/dL.  Her kappa light chain was 37 mg/dL.  She has been on venetoclax given that there was the specific chromosome mutation - t(11:14) -as part of her multiple chromosomal abnormalities.  I just do not think that it is working.  I think she tolerated it poorly.  The options that we have now are very limited.  I know that the FDA just approved the new agent - Melflufen.this might be reasonable to try.  She still does have a lot of pain issues.  She is on a Duragesic patch.  She is on 25 mcg.  I told her to  change the patch every 2 days not 3 days.  She says that ibuprofen helps quite a bit with the pain over in the left rib cage.  However, I really cannot have her take 600 mg of ibuprofen 3 or 4 times a day.  This will adversely affect her kidneys and also increase her risk of GI bleeding.  We will try her on the oxycodone/ibuprofen combination pill which might be able to give her some relief.  She is not having diarrhea.  She is not having a fever.  There is no bleeding.  She is not had any rashes.  She and her family are really looking forward to going to the beach next week.  They will be going down to [facility].  They get out every year.  I really would like to make sure she goes down and is comfortable.  Currently, her performance status is ECOG 2.       Medications:  Allergies as of 06/21/2020      Reactions   Metformin Diarrhea, Nausea Only      Medication List       Accurate as of June 21, 2020  3:04 PM. If you have any questions, ask your nurse or doctor.        ALPRAZolam 0.5 MG tablet Commonly known as: XANAX Take 0.5 mg by mouth at  bedtime as needed for sleep.   cyclobenzaprine 10 MG tablet Commonly known as: FLEXERIL Take 1 tablet (10 mg total) by mouth 3 (three) times daily as needed for muscle spasms.   Farxiga 10 MG Tabs tablet Generic drug: dapagliflozin propanediol Take 10 mg by mouth daily.   fentaNYL 12 MCG/HR Commonly known as: Vicki Moon 1 patch onto the skin every 3 (three) days.   FreeStyle Vicki Moon Use one sensor every 10 days.   HYDROcodone-acetaminophen 10-325 MG tablet Commonly known as: NORCO Take 1 tablet by mouth every 6 (six) hours as needed. For pain.   latanoprost 0.005 % ophthalmic solution Commonly known as: XALATAN Place 1 drop into both eyes at bedtime.   NovoLOG 100 UNIT/ML injection Generic drug: insulin aspart Inject 1.2-25 Units into the skin as directed. PATIENT USES INSULIN PUMP  Patient has a set  basal rate of 1.2-1.5u per hour with meal time boluses of up to 25 units   Vicki Moon 1 Device by Does not apply route 3 (three) times a week. Patient put on insulin pump every 2 to 3 days for insulin delivery   ondansetron 8 MG tablet Commonly known as: ZOFRAN Take 1 tablet (8 mg total) by mouth every 8 (eight) hours as needed for nausea or vomiting.   PARoxetine 30 MG tablet Commonly known as: PAXIL Take 30 mg by mouth every morning.   PRESCRIPTION MEDICATION Vicki Moon dash-insulin pump.Changes every 2 days. 5-25units ac based on blood sugar.   temazepam 15 MG capsule Commonly known as: RESTORIL Take 1 capsule (15 mg total) by mouth at bedtime as needed for sleep.   venetoclax 100 MG Tabs Take 200 mg by mouth daily.   Vitamin D3 1.25 MG (50000 UT) Caps Take 1 capsule by mouth every Sunday.       Allergies:  Allergies  Allergen Reactions   Metformin Diarrhea and Nausea Only    Past Medical History, Surgical history, Social history, and Family History were reviewed and updated.  Review of Systems: Review of Systems  Constitutional: Negative.   HENT: Negative.   Eyes: Negative.   Respiratory: Negative.   Cardiovascular: Negative.   Gastrointestinal: Negative.   Genitourinary: Negative.   Musculoskeletal: Negative.   Skin: Negative.   Neurological: Negative.   Endo/Heme/Allergies: Negative.   Psychiatric/Behavioral: Negative.      Physical Exam:  weight is 155 lb (70.3 kg). Her temporal temperature is 96.6 F (35.9 C) (abnormal). Her blood pressure is 133/57 (abnormal) and her pulse is 79. Her respiration is 19 and oxygen saturation is 100%.   Wt Readings from Last 3 Encounters:  06/21/20 155 lb (70.3 kg)  05/24/20 156 lb (70.8 kg)  05/11/20 159 lb (72.1 kg)    Physical Exam Vitals reviewed.  HENT:     Head: Normocephalic and atraumatic.  Eyes:     Pupils: Pupils are equal, round, and reactive to light.  Cardiovascular:     Rate and  Rhythm: Normal rate and regular rhythm.     Heart sounds: Normal heart sounds.  Pulmonary:     Effort: Pulmonary effort is normal.     Breath sounds: Normal breath sounds.  Abdominal:     General: Bowel sounds are normal.     Palpations: Abdomen is soft.  Musculoskeletal:        General: No tenderness or deformity. Normal range of motion.     Cervical back: Normal range of motion.     Comments: Her extremities, she  does have the mass in the lateral aspect of the right shoulder area. This is almost post axillary. It is firm. It is slightly tender. There is no warmth.  Lymphadenopathy:     Cervical: No cervical adenopathy.  Skin:    General: Skin is warm and dry.     Findings: No erythema or rash.  Neurological:     Mental Status: She is alert and oriented to person, place, and time.  Psychiatric:        Behavior: Behavior normal.        Thought Content: Thought content normal.        Judgment: Judgment normal.      Lab Results  Component Value Date   WBC 2.8 (L) 06/21/2020   HGB 10.2 (L) 06/21/2020   HCT 31.5 (L) 06/21/2020   MCV 97.2 06/21/2020   PLT 210 06/21/2020   Lab Results  Component Value Date   FERRITIN 1,339 (H) 03/13/2020   IRON 101 03/13/2020   TIBC 194 (L) 03/13/2020   UIBC 92 (L) 03/13/2020   IRONPCTSAT 52 03/13/2020   Lab Results  Component Value Date   RETICCTPCT 1.2 02/15/2020   RBC 3.24 (L) 06/21/2020   Lab Results  Component Value Date   KPAFRELGTCHN 365.8 (H) 05/28/2020   LAMBDASER 3.2 (L) 05/28/2020   KAPLAMBRATIO 114.31 (H) 05/28/2020   Lab Results  Component Value Date   IGGSERUM 2,744 (H) 05/28/2020   IGGSERUM 3,221 (H) 05/28/2020   IGA 8 (L) 05/28/2020   IGA 8 (L) 05/28/2020   IGMSERUM 14 (L) 05/28/2020   IGMSERUM 14 (L) 05/28/2020   Lab Results  Component Value Date   TOTALPROTELP 7.7 05/28/2020   ALBUMINELP 3.4 05/28/2020   A1GS 0.3 05/28/2020   A2GS 0.9 05/28/2020   BETS 0.9 05/28/2020   GAMS 2.3 (H) 05/28/2020   MSPIKE  2.2 (H) 05/28/2020   SPEI Comment 03/23/2020     Chemistry      Component Value Date/Time   NA 137 06/21/2020 1350   K 3.5 06/21/2020 1350   CL 106 06/21/2020 1350   CO2 23 06/21/2020 1350   BUN 17 06/21/2020 1350   CREATININE 1.09 (H) 06/21/2020 1350   CREATININE 0.65 01/12/2018 1004      Component Value Date/Time   CALCIUM 9.0 06/21/2020 1350   ALKPHOS 110 06/21/2020 1350   AST 18 06/21/2020 1350   ALT 10 06/21/2020 1350   BILITOT 0.4 06/21/2020 1350       Impression and Plan: Ms. Kindig is a very pleasant 69 yo caucasian female with IgG kappa myeloma, with what I would consider to have high risk cytogenetics.  I am glad that the radiation help the right shoulder.  Hopefully she will get results and better pain relief with the left rib cage.  I spent about 40-45 minutes with she and her husband.  Again this is quite complex.  She just has a very resilient myeloma.  She has been on 4 or 5 different protocols for her myeloma.  She was had a transient response.  We will give the Melflufen chance.  Hopefully she will respond to this.  She really wants to wait till after she gets back from vacation.  I totally agree with this.  I do not see any "downside" to waiting.  Again I hope that she will have a wonderful time down the beach.  Hopefully, she will have good pain control so she will be comfortable and will be able to enjoy her  family.   [name], MD 7/1/20213:04 PM

## 2020-06-22 DIAGNOSIS — C9000 Multiple myeloma not having achieved remission: Secondary | ICD-10-CM | POA: Diagnosis not present

## 2020-06-22 DIAGNOSIS — Z510 Encounter for antineoplastic radiation therapy: Secondary | ICD-10-CM | POA: Diagnosis not present

## 2020-06-22 LAB — KAPPA/LAMBDA LIGHT CHAINS
Kappa free light chain: 559.7 mg/L — ABNORMAL HIGH (ref 3.3–19.4)
Kappa, lambda light chain ratio: 164.62 — ABNORMAL HIGH (ref 0.26–1.65)
Lambda free light chains: 3.4 mg/L — ABNORMAL LOW (ref 5.7–26.3)

## 2020-06-22 LAB — IGG, IGA, IGM
IgA: 7 mg/dL — ABNORMAL LOW (ref 64–422)
IgG (Immunoglobin G), Serum: 2994 mg/dL — ABNORMAL HIGH (ref 586–1602)
IgM (Immunoglobulin M), Srm: 13 mg/dL — ABNORMAL LOW (ref 26–217)

## 2020-06-26 LAB — IMMUNOFIXATION REFLEX, SERUM
IgA: 8 mg/dL — ABNORMAL LOW (ref 64–422)
IgG (Immunoglobin G), Serum: 3325 mg/dL — ABNORMAL HIGH (ref 586–1602)
IgM (Immunoglobulin M), Srm: 13 mg/dL — ABNORMAL LOW (ref 26–217)

## 2020-06-26 LAB — PROTEIN ELECTROPHORESIS, SERUM, WITH REFLEX
A/G Ratio: 0.8 (ref 0.7–1.7)
Albumin ELP: 3.4 g/dL (ref 2.9–4.4)
Alpha-1-Globulin: 0.3 g/dL (ref 0.0–0.4)
Alpha-2-Globulin: 0.9 g/dL (ref 0.4–1.0)
Beta Globulin: 0.8 g/dL (ref 0.7–1.3)
Gamma Globulin: 2.3 g/dL — ABNORMAL HIGH (ref 0.4–1.8)
Globulin, Total: 4.4 g/dL — ABNORMAL HIGH (ref 2.2–3.9)
M-Spike, %: 2.1 g/dL — ABNORMAL HIGH
SPEP Interpretation: 0
Total Protein ELP: 7.8 g/dL (ref 6.0–8.5)

## 2020-06-27 NOTE — Progress Notes (Unsigned)
Pharmacist Chemotherapy Monitoring - Initial Assessment    Anticipated start date: 07/04/20  Regimen:  . Are orders appropriate based on the patient's diagnosis, regimen, and cycle? Yes . Does the plan date match the patient's scheduled date? Yes . Is the sequencing of drugs appropriate? Yes . Are the premedications appropriate for the patient's regimen? Yes . Prior Authorization for treatment is: Approved o If applicable, is the correct biosimilar selected based on the patient's insurance? not applicable  Organ Function and Labs: Marland Kitchen Are dose adjustments needed based on the patient's renal function, hepatic function, or hematologic function? No . Are appropriate labs ordered prior to the start of patient's treatment? Yes . Other organ system assessment, if indicated: N/A . The following baseline labs, if indicated, have been ordered: N/A  Dose Assessment: . Are the drug doses appropriate? Yes . Are the following correct: o Drug concentrations Yes o IV fluid compatible with drug Yes o Administration routes Yes o Timing of therapy Yes . If applicable, does the patient have documented access for treatment and/or plans for port-a-cath placement? yes . If applicable, have lifetime cumulative doses been properly documented and assessed? not applicable Lifetime Dose Tracking  No doses have been documented on this patient for the following tracked chemicals: Doxorubicin, Epirubicin, Idarubicin, Daunorubicin, Mitoxantrone, Bleomycin, Oxaliplatin, Carboplatin, Liposomal Doxorubicin  o   Toxicity Monitoring/Prevention:  . The patient has the following take home antiemetics prescribed: Ondansetron, Ativan, Compazine, dexamethasone . The patient has the following take home medications prescribed: N/A . Medication allergies and previous infusion related reactions, if applicable, have been reviewed and addressed. Yes . The patient's current medication list has been assessed for drug-drug  interactions with their chemotherapy regimen. no significant drug-drug interactions were identified on review.  Order Review: . Are the treatment plan orders signed? No . Is the patient scheduled to see a provider prior to their treatment? Yes  I verify that I have reviewed each item in the above checklist and answered each question accordingly.  Nikayla Madaris, Jacqlyn Larsen 06/27/2020 8:47 AM

## 2020-07-02 ENCOUNTER — Emergency Department (HOSPITAL_COMMUNITY): Payer: Medicare Other

## 2020-07-02 ENCOUNTER — Inpatient Hospital Stay (HOSPITAL_COMMUNITY)
Admission: EM | Admit: 2020-07-02 | Discharge: 2020-07-13 | DRG: 660 | Disposition: A | Discharge status: Hospice | Payer: Medicare Other | Attending: Internal Medicine | Admitting: Internal Medicine

## 2020-07-02 ENCOUNTER — Telehealth: Payer: Self-pay

## 2020-07-02 ENCOUNTER — Other Ambulatory Visit: Payer: Self-pay | Admitting: Hematology & Oncology

## 2020-07-02 ENCOUNTER — Encounter (HOSPITAL_COMMUNITY): Payer: Self-pay | Admitting: Emergency Medicine

## 2020-07-02 ENCOUNTER — Other Ambulatory Visit: Payer: Self-pay

## 2020-07-02 DIAGNOSIS — N179 Acute kidney failure, unspecified: Secondary | ICD-10-CM | POA: Diagnosis present

## 2020-07-02 DIAGNOSIS — D61818 Other pancytopenia: Secondary | ICD-10-CM | POA: Diagnosis present

## 2020-07-02 DIAGNOSIS — Z515 Encounter for palliative care: Secondary | ICD-10-CM

## 2020-07-02 DIAGNOSIS — N139 Obstructive and reflux uropathy, unspecified: Secondary | ICD-10-CM

## 2020-07-02 DIAGNOSIS — R0789 Other chest pain: Secondary | ICD-10-CM

## 2020-07-02 DIAGNOSIS — I700 Atherosclerosis of aorta: Secondary | ICD-10-CM | POA: Diagnosis not present

## 2020-07-02 DIAGNOSIS — R1084 Generalized abdominal pain: Secondary | ICD-10-CM | POA: Diagnosis present

## 2020-07-02 DIAGNOSIS — E860 Dehydration: Secondary | ICD-10-CM | POA: Diagnosis present

## 2020-07-02 DIAGNOSIS — Z9221 Personal history of antineoplastic chemotherapy: Secondary | ICD-10-CM

## 2020-07-02 DIAGNOSIS — Z9641 Presence of insulin pump (external) (internal): Secondary | ICD-10-CM | POA: Diagnosis present

## 2020-07-02 DIAGNOSIS — Z79899 Other long term (current) drug therapy: Secondary | ICD-10-CM

## 2020-07-02 DIAGNOSIS — Z66 Do not resuscitate: Secondary | ICD-10-CM | POA: Diagnosis not present

## 2020-07-02 DIAGNOSIS — Z20822 Contact with and (suspected) exposure to covid-19: Secondary | ICD-10-CM | POA: Diagnosis present

## 2020-07-02 DIAGNOSIS — I2699 Other pulmonary embolism without acute cor pulmonale: Secondary | ICD-10-CM

## 2020-07-02 DIAGNOSIS — R7989 Other specified abnormal findings of blood chemistry: Secondary | ICD-10-CM

## 2020-07-02 DIAGNOSIS — Z923 Personal history of irradiation: Secondary | ICD-10-CM

## 2020-07-02 DIAGNOSIS — I10 Essential (primary) hypertension: Secondary | ICD-10-CM | POA: Diagnosis present

## 2020-07-02 DIAGNOSIS — D538 Other specified nutritional anemias: Secondary | ICD-10-CM | POA: Diagnosis present

## 2020-07-02 DIAGNOSIS — K5900 Constipation, unspecified: Secondary | ICD-10-CM | POA: Diagnosis present

## 2020-07-02 DIAGNOSIS — F17210 Nicotine dependence, cigarettes, uncomplicated: Secondary | ICD-10-CM | POA: Diagnosis present

## 2020-07-02 DIAGNOSIS — H409 Unspecified glaucoma: Secondary | ICD-10-CM | POA: Diagnosis present

## 2020-07-02 DIAGNOSIS — F329 Major depressive disorder, single episode, unspecified: Secondary | ICD-10-CM | POA: Diagnosis present

## 2020-07-02 DIAGNOSIS — N201 Calculus of ureter: Secondary | ICD-10-CM | POA: Diagnosis present

## 2020-07-02 DIAGNOSIS — Z8249 Family history of ischemic heart disease and other diseases of the circulatory system: Secondary | ICD-10-CM

## 2020-07-02 DIAGNOSIS — E119 Type 2 diabetes mellitus without complications: Secondary | ICD-10-CM

## 2020-07-02 DIAGNOSIS — J9811 Atelectasis: Secondary | ICD-10-CM | POA: Diagnosis not present

## 2020-07-02 DIAGNOSIS — G893 Neoplasm related pain (acute) (chronic): Secondary | ICD-10-CM | POA: Diagnosis present

## 2020-07-02 DIAGNOSIS — N1330 Unspecified hydronephrosis: Secondary | ICD-10-CM | POA: Diagnosis present

## 2020-07-02 DIAGNOSIS — D649 Anemia, unspecified: Secondary | ICD-10-CM | POA: Diagnosis present

## 2020-07-02 DIAGNOSIS — R109 Unspecified abdominal pain: Secondary | ICD-10-CM | POA: Diagnosis not present

## 2020-07-02 DIAGNOSIS — C9000 Multiple myeloma not having achieved remission: Secondary | ICD-10-CM | POA: Diagnosis not present

## 2020-07-02 DIAGNOSIS — Z823 Family history of stroke: Secondary | ICD-10-CM

## 2020-07-02 DIAGNOSIS — N131 Hydronephrosis with ureteral stricture, not elsewhere classified: Secondary | ICD-10-CM | POA: Diagnosis not present

## 2020-07-02 DIAGNOSIS — D499 Neoplasm of unspecified behavior of unspecified site: Secondary | ICD-10-CM

## 2020-07-02 DIAGNOSIS — Z808 Family history of malignant neoplasm of other organs or systems: Secondary | ICD-10-CM

## 2020-07-02 DIAGNOSIS — R1032 Left lower quadrant pain: Secondary | ICD-10-CM | POA: Diagnosis not present

## 2020-07-02 DIAGNOSIS — E876 Hypokalemia: Secondary | ICD-10-CM | POA: Diagnosis present

## 2020-07-02 DIAGNOSIS — F32A Depression, unspecified: Secondary | ICD-10-CM | POA: Diagnosis present

## 2020-07-02 DIAGNOSIS — Z833 Family history of diabetes mellitus: Secondary | ICD-10-CM

## 2020-07-02 DIAGNOSIS — F419 Anxiety disorder, unspecified: Secondary | ICD-10-CM | POA: Diagnosis present

## 2020-07-02 DIAGNOSIS — C9002 Multiple myeloma in relapse: Secondary | ICD-10-CM

## 2020-07-02 LAB — CBC WITH DIFFERENTIAL/PLATELET
Abs Immature Granulocytes: 0.04 10*3/uL (ref 0.00–0.07)
Basophils Absolute: 0 10*3/uL (ref 0.0–0.1)
Basophils Relative: 0 %
Eosinophils Absolute: 0 10*3/uL (ref 0.0–0.5)
Eosinophils Relative: 0 %
HCT: 36.1 % (ref 36.0–46.0)
Hemoglobin: 11.6 g/dL — ABNORMAL LOW (ref 12.0–15.0)
Immature Granulocytes: 1 %
Lymphocytes Relative: 6 %
Lymphs Abs: 0.2 10*3/uL — ABNORMAL LOW (ref 0.7–4.0)
MCH: 31.6 pg (ref 26.0–34.0)
MCHC: 32.1 g/dL (ref 30.0–36.0)
MCV: 98.4 fL (ref 80.0–100.0)
Monocytes Absolute: 0.6 10*3/uL (ref 0.1–1.0)
Monocytes Relative: 17 %
Neutro Abs: 2.8 10*3/uL (ref 1.7–7.7)
Neutrophils Relative %: 76 %
Platelets: 248 10*3/uL (ref 150–400)
RBC: 3.67 MIL/uL — ABNORMAL LOW (ref 3.87–5.11)
RDW: 15.1 % (ref 11.5–15.5)
WBC: 3.7 10*3/uL — ABNORMAL LOW (ref 4.0–10.5)
nRBC: 0 % (ref 0.0–0.2)

## 2020-07-02 LAB — BASIC METABOLIC PANEL
Anion gap: 10 (ref 5–15)
BUN: 21 mg/dL (ref 8–23)
CO2: 17 mmol/L — ABNORMAL LOW (ref 22–32)
Calcium: 7.6 mg/dL — ABNORMAL LOW (ref 8.9–10.3)
Chloride: 109 mmol/L (ref 98–111)
Creatinine, Ser: 1.46 mg/dL — ABNORMAL HIGH (ref 0.44–1.00)
GFR calc Af Amer: 42 mL/min — ABNORMAL LOW (ref >60)
GFR calc non Af Amer: 36 mL/min — ABNORMAL LOW (ref >60)
Glucose, Bld: 138 mg/dL — ABNORMAL HIGH (ref 70–99)
Potassium: 4.4 mmol/L (ref 3.5–5.1)
Sodium: 136 mmol/L (ref 135–145)

## 2020-07-02 LAB — COMPREHENSIVE METABOLIC PANEL
ALT: 14 U/L (ref 0–44)
AST: 22 U/L (ref 15–41)
Albumin: 3 g/dL — ABNORMAL LOW (ref 3.5–5.0)
Alkaline Phosphatase: 93 U/L (ref 38–126)
Anion gap: 11 (ref 5–15)
BUN: 21 mg/dL (ref 8–23)
CO2: 21 mmol/L — ABNORMAL LOW (ref 22–32)
Calcium: 8.6 mg/dL — ABNORMAL LOW (ref 8.9–10.3)
Chloride: 104 mmol/L (ref 98–111)
Creatinine, Ser: 1.52 mg/dL — ABNORMAL HIGH (ref 0.44–1.00)
GFR calc Af Amer: 40 mL/min — ABNORMAL LOW (ref >60)
GFR calc non Af Amer: 34 mL/min — ABNORMAL LOW (ref >60)
Glucose, Bld: 125 mg/dL — ABNORMAL HIGH (ref 70–99)
Potassium: 4.6 mmol/L (ref 3.5–5.1)
Sodium: 136 mmol/L (ref 135–145)
Total Bilirubin: 0.8 mg/dL (ref 0.3–1.2)
Total Protein: 8.7 g/dL — ABNORMAL HIGH (ref 6.5–8.1)

## 2020-07-02 LAB — TROPONIN I (HIGH SENSITIVITY)
Troponin I (High Sensitivity): 8 ng/L (ref <18)
Troponin I (High Sensitivity): 9 ng/L (ref <18)

## 2020-07-02 LAB — D-DIMER, QUANTITATIVE: D-Dimer, Quant: 4.96 ug/mL-FEU — ABNORMAL HIGH (ref 0.00–0.50)

## 2020-07-02 LAB — LIPASE, BLOOD: Lipase: 19 U/L (ref 11–51)

## 2020-07-02 MED ORDER — SODIUM CHLORIDE 0.9 % IV BOLUS
500.0000 mL | Freq: Once | INTRAVENOUS | Status: AC
  Administered 2020-07-02: 500 mL via INTRAVENOUS

## 2020-07-02 MED ORDER — SODIUM CHLORIDE 0.9 % IV SOLN: INTRAVENOUS | Status: DC

## 2020-07-02 MED ORDER — HYDROMORPHONE HCL 1 MG/ML IJ SOLN
0.5000 mg | Freq: Once | INTRAMUSCULAR | Status: DC
  Filled 2020-07-02: qty 1

## 2020-07-02 MED ORDER — HYDROMORPHONE HCL 1 MG/ML IJ SOLN: 1.0000 mg | Freq: Once | INTRAMUSCULAR | Status: DC

## 2020-07-02 MED ORDER — HYDROMORPHONE HCL 1 MG/ML IJ SOLN
0.5000 mg | Freq: Once | INTRAMUSCULAR | Status: AC
  Administered 2020-07-02: 0.5 mg via INTRAVENOUS
  Filled 2020-07-02: qty 1

## 2020-07-02 MED ORDER — ONDANSETRON HCL 4 MG/2ML IJ SOLN
4.0000 mg | Freq: Once | INTRAMUSCULAR | Status: AC
  Administered 2020-07-02: 4 mg via INTRAVENOUS
  Filled 2020-07-02: qty 2

## 2020-07-02 MED ORDER — HYDROMORPHONE HCL 1 MG/ML IJ SOLN
1.0000 mg | Freq: Once | INTRAMUSCULAR | Status: AC
  Administered 2020-07-02: 1 mg via INTRAVENOUS

## 2020-07-02 MED ORDER — ENOXAPARIN SODIUM 80 MG/0.8ML ~~LOC~~ SOLN
1.0000 mg/kg | Freq: Once | SUBCUTANEOUS | Status: AC
  Administered 2020-07-03: 70 mg via SUBCUTANEOUS
  Filled 2020-07-02: qty 0.8

## 2020-07-02 NOTE — Telephone Encounter (Signed)
Received call from pt reporting that she has been in excruciating pain for three days. Pain is in left rib cage, radiating to sternum; area of known disease. Pt recently completed radiation to this area. Dr [name] had prescribed Duragesic patches and oxycodone-ibuprofen combination drug, but pt states both were denied by insurance. Is taking hydrocodone pre previous script. Pt states she is unable to sleep, eat or drink d/t pain. Questions if Dr [name] "can put me in the hospital and just do something to knock me out?"  In Dr Antonieta Pert absence this week, discussed with Judson Roch, NP. Per Judson Roch, pt to go to ED for pain control and possible admission. Pt aware and agrees with plan. dph

## 2020-07-02 NOTE — ED Notes (Signed)
Pt requesting more pain medication at this time. Dr. Rogene Houston notified.

## 2020-07-02 NOTE — ED Triage Notes (Signed)
Pt reports she is a cancer pt and is having increased pain since Fri, reports pain to left side, left lower abd and back

## 2020-07-02 NOTE — ED Provider Notes (Signed)
[facility] EMERGENCY DEPARTMENT Provider Note   CSN: [id number] Arrival date & time: 07/02/20  1134     History Chief Complaint  Patient presents with  . Flank Pain    Vicki Moon is a 69 y.o. female.  Patient with a history of multiple myeloma.  Still taking some medication for that.  Followed at [facility].  Patient is not in remission patient on Friday started with left-sided abdominal pain and left-sided chest wall pain.  Patient known to have some lesions to her ribs.  No fever.  No nausea or vomiting.  No shortness of breath.  But the left-sided chest wall pain does hurt more with breathing.        Past Medical History:  Diagnosis Date  . Diabetes mellitus without complication (HCC)    Type 2 IDDM x 5 years  . 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 Glaucoma   . Goals of care, counseling/discussion 04/29/2019  . Herniated lumbar intervertebral disc 06/2014  . Multiple myeloma ([place]) 04/29/2019  . PONV (postoperative nausea and vomiting)     Patient Active Problem List   Diagnosis Date Noted  . Dehydration 03/13/2020  . 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 Hypokalemia   . Furunculosis 05/25/2019  . 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 Diabetes mellitus without complication ([place]) 82/99/3716  . Multiple Skin abscessed due to MRSA 05/24/2019  . Pancytopenia ([place]) 05/24/2019  . Multiple myeloma ([place]) 04/29/2019  . Goals of care, counseling/discussion 04/29/2019  . Pain in abdominal muscle of right flank 03/08/2019  . Abscess of right axilla   . Cellulitis of axilla, right   . Cellulitis 12/17/2018  . AKI (acute kidney injury) ([place]) 12/17/2018  . Anemia 12/17/2018  . Type 2 diabetes mellitus ([place]) 12/17/2018  . Depression 12/17/2018  . Trochanteric bursitis, right hip 08/12/2018  . Post-menopausal 01/19/2018  . Current smoker 01/19/2018  . Mixed hyperlipidemia 07/13/2017  . Class 2 severe obesity due to excess calories  with serious comorbidity and body mass index (BMI) of 37.0 to 37.9 in adult ([place]) 07/13/2017  . Hypercortisolemia 01/20/2017  . Excessive weight gain 01/20/2017  . Generalized abdominal pain 01/20/2017  . Uncontrolled type 2 diabetes mellitus with complication, with long-term current use of insulin ([place]) 12/20/2015  . Essential hypertension, benign 12/20/2015  . Vitamin D deficiency 12/20/2015  . Rectal bleeding 05/29/2015  . HNP (herniated nucleus pulposus), lumbar 07/12/2014    Class: Diagnosis of    Past Surgical History:  Procedure Laterality Date  . CHOLECYSTECTOMY    . COLONOSCOPY N/A 06/28/2015   Procedure: COLONOSCOPY;  Surgeon: Rogene Houston, MD;  [facility];  Service: Endoscopy;  Laterality: N/A;  155  . EYE SURGERY    . IR BONE MARROW BIOPSY & ASPIRATION  03/16/2020  . IR BONE TUMOR(S)RF ABLATION  04/28/2019  . IR KYPHO LUMBAR INC FX REDUCE BONE BX UNI/BIL CANNULATION INC/IMAGING  04/28/2019  . IR RADIOLOGIST EVAL & MGMT  04/22/2019  . LUMBAR LAMINECTOMY Right 07/12/2014   Procedure: LUMBAR FIVE TO SACRAL ONE MICRODISCECTOMY;  Surgeon: Marybelle Killings, MD;  Location: [place];  Service: Orthopedics;  Laterality: Right;  . TUBAL LIGATION    . VITRECTOMY 25 GAUGE WITH SCLERAL BUCKLE Left 07/29/2017   Procedure: RETINAL DETACHMENT REPAIR LEFT EYE WITH PARSPLANA VITRECTOMY, AIR FLUID EXCHANGE, ENDO LASTER, DRAINAGE OF SUBRETINAL FLUID,ENDOLASER PPV /25 GAUGE;  Surgeon: Jalene Mullet, MD;  Location: [place];  Service: Ophthalmology;  Laterality: Left;  OB History   No obstetric history on file.     Family History  Problem Relation Age of Onset  . Hypertension Mother   . Stroke Mother   . Cancer Father        brain tumor  . Diabetes Father     Social History   Tobacco Use  . Smoking status: Current Some Day Smoker    Packs/day: 1.00    Years: 10.00    Pack years: 10.00    Types: Cigarettes    Last attempt to quit: 02/20/2019    Years since quitting: 1.3  .  Smokeless tobacco: Never Used  . Tobacco comment: 1 pack/month   Vaping Use  . Vaping Use: Never used  Substance Use Topics  . Alcohol use: No    Alcohol/week: 0.0 standard drinks  . Drug use: No    Home Medications Prior to Admission medications   Medication Sig Start Date End Date Taking? Authorizing Provider  ALPRAZolam Duanne Moron) 0.5 MG tablet Take 0.5 mg by mouth at bedtime as needed for sleep.  05/10/19  Yes [provider]  cyclobenzaprine (FLEXERIL) 10 MG tablet Take 1 tablet (10 mg total) by mouth 3 (three) times daily as needed for muscle spasms. 05/28/20  Yes Ennever, Rudell Cobb, MD  FARXIGA 10 MG TABS tablet Take 10 mg by mouth 2 (two) times a week.  04/18/20  Yes [provider]  fentaNYL (DURAGESIC) 25 MCG/HR Place 1 patch onto the skin every other day. 06/21/20  Yes Ennever, Rudell Cobb, MD  HYDROcodone-acetaminophen (NORCO) 10-325 MG tablet Take 1 tablet by mouth every 6 (six) hours as needed.   Yes [provider]  latanoprost (XALATAN) 0.005 % ophthalmic solution Place 1 drop into both eyes at bedtime.   Yes [provider]  NOVOLOG 100 UNIT/ML injection Inject 1.2-25 Units into the skin as directed. PATIENT USES INSULIN PUMP  Patient has a set basal rate of 1.2-1.5u per hour with meal time boluses of up to 25 units 05/06/19  Yes [provider]  ondansetron (ZOFRAN) 8 MG tablet Take 1 tablet (8 mg total) by mouth every 8 (eight) hours as needed for nausea or vomiting. 11/30/19  Yes [name], MD  PARoxetine (PAXIL) 30 MG tablet Take 30 mg by mouth every morning. 07/12/18  Yes [provider]  temazepam (RESTORIL) 15 MG capsule Take 1 capsule (15 mg total) by mouth at bedtime as needed for sleep. 05/08/20  Yes Ennever, Rudell Cobb, MD  oxycodone-ibuprofen (COMBUNOX) 5-400 MG tablet Take 1 tablet by mouth every 6 (six) hours as needed for pain. Patient not taking: Reported on 07/02/2020 06/21/20   [name], MD  venetoclax 100 MG  TABS Take 200 mg by mouth daily. Patient not taking: Reported on 07/02/2020 03/23/20   [name], MD    Allergies    Metformin  Review of Systems   Review of Systems  Constitutional: Negative for chills and fever.  HENT: Negative for rhinorrhea and sore throat.   Eyes: Negative for visual disturbance.  Respiratory: Negative for cough and shortness of breath.   Cardiovascular: Positive for chest pain. Negative for leg swelling.  Gastrointestinal: Positive for abdominal pain. Negative for diarrhea, nausea and vomiting.  Genitourinary: Negative for dysuria.  Musculoskeletal: Negative for back pain and neck pain.  Skin: Negative for rash.  Neurological: Negative for dizziness, light-headedness and headaches.  Hematological: Does not bruise/bleed easily.  Psychiatric/Behavioral: Negative for confusion.    Physical Exam Updated  Vital Signs BP 134/63   Pulse 85   Temp 98.6 F (37 C) (Oral)   Resp 16   Ht 1.575 m (5' 2" )   Wt 70 kg   SpO2 97%   BMI 28.23 kg/m   Physical Exam Vitals and nursing note reviewed.  Constitutional:      General: She is not in acute distress.    Appearance: Normal appearance. She is well-developed.  HENT:     Head: Normocephalic and atraumatic.  Eyes:     Extraocular Movements: Extraocular movements intact.     Conjunctiva/sclera: Conjunctivae normal.     Pupils: Pupils are equal, round, and reactive to light.  Cardiovascular:     Rate and Rhythm: Normal rate and regular rhythm.     Heart sounds: No murmur heard.   Pulmonary:     Effort: Pulmonary effort is normal. No respiratory distress.     Breath sounds: Normal breath sounds.     Comments: Tenderness to palpation to left lateral and anterior lower ribs. Chest:     Chest wall: Tenderness present.  Abdominal:     Palpations: Abdomen is soft.     Tenderness: There is no abdominal tenderness.     Comments: Tenderness to palpation left upper quadrant part of the abdomen.    Musculoskeletal:     Cervical back: Normal range of motion and neck supple.  Skin:    General: Skin is warm and dry.     Capillary Refill: Capillary refill takes less than 2 seconds.  Neurological:     General: No focal deficit present.     Mental Status: She is alert and oriented to person, place, and time.     ED Results / Procedures / Treatments   Labs (all labs ordered are listed, but only abnormal results are displayed) Labs Reviewed  COMPREHENSIVE METABOLIC PANEL - Abnormal; Notable for the following components:      Result Value   CO2 21 (*)    Glucose, Bld 125 (*)    Creatinine, Ser 1.52 (*)    Calcium 8.6 (*)    Total Protein 8.7 (*)    Albumin 3.0 (*)    GFR calc non Af Amer 34 (*)    GFR calc Af Amer 40 (*)    All other components within normal limits  CBC WITH DIFFERENTIAL/PLATELET - Abnormal; Notable for the following components:   WBC 3.7 (*)    RBC 3.67 (*)    Hemoglobin 11.6 (*)    Lymphs Abs 0.2 (*)    All other components within normal limits  D-DIMER, QUANTITATIVE (NOT AT [facility]) - Abnormal; Notable for the following components:   D-Dimer, Quant 4.96 (*)    All other components within normal limits  BASIC METABOLIC PANEL - Abnormal; Notable for the following components:   CO2 17 (*)    Glucose, Bld 138 (*)    Creatinine, Ser 1.46 (*)    Calcium 7.6 (*)    GFR calc non Af Amer 36 (*)    GFR calc Af Amer 42 (*)    All other components within normal limits  LIPASE, BLOOD  TROPONIN I (HIGH SENSITIVITY)  TROPONIN I (HIGH SENSITIVITY)  TROPONIN I (HIGH SENSITIVITY)    EKG EKG Interpretation  Date/Time:  Monday July 02 2020 20:48:36 EDT Ventricular Rate:  86 PR Interval:    QRS Duration: 100 QT Interval:  396 QTC Calculation: 474 R Axis:   -53 Text Interpretation: Sinus rhythm Borderline short PR interval LAD,  consider left anterior fascicular block Baseline wander in lead(s) V1 Confirmed by Fredia Sorrow [phone]) on 07/02/2020 9:57:28  PM   Radiology DG Chest 2 View  Result Date: 07/02/2020 CLINICAL DATA:  Chest pain. EXAM: CHEST - 2 VIEW COMPARISON:  Chest CT 05/08/2020 FINDINGS: The cardiomediastinal contours are normal. The lungs are clear. Pulmonary vasculature is normal. No consolidation, pleural effusion, or pneumothorax. Right scapular lesion on CT is not well seen by radiograph. Callus about left rib fractures, lytic lesions seen on prior chest CT. Callus about few anterior right ribs. Endplate irregularity superior aspect of tentatively L2, chronic based on 06/07/2019 abdominal CT. IMPRESSION: 1. No acute radiographic findings. 2. Majority of the bone lesions on prior CT are not well seen by radiograph. Callus about left greater than right rib fractures, lytic lesions seen on prior chest CT. Electronically Signed   By: Keith Rake M.D.   On: 07/02/2020 17:37    Procedures Procedures (including critical care time)  Medications Ordered in ED Medications  0.9 %  sodium chloride infusion ( Intravenous Rate/Dose Verify 07/03/20 0036)  sodium chloride 0.9 % bolus 500 mL (0 mLs Intravenous Stopped 07/02/20 1915)  ondansetron (ZOFRAN) injection 4 mg (4 mg Intravenous Given 07/02/20 1744)  HYDROmorphone (DILAUDID) injection 0.5 mg (0.5 mg Intravenous Given 07/02/20 1744)  sodium chloride 0.9 % bolus 500 mL ( Intravenous Stopped 07/02/20 2112)  HYDROmorphone (DILAUDID) injection 1 mg (1 mg Intravenous Given 07/02/20 2034)  enoxaparin (LOVENOX) injection 70 mg (70 mg Subcutaneous Given 07/03/20 0035)    ED Course  I have reviewed the triage vital signs and the nursing notes.  Pertinent labs & imaging results that were available during my care of the patient were reviewed by me and considered in my medical decision making (see chart for details).    MDM Rules/Calculators/A&P                         Patient discussed with the hospitalist.  Patient with history of multiple myeloma.  D-dimer is elevated.  Chest x-ray showed  some lesions in the left ribs.  His renal function is worse than it was at the beginning of the month so technically has acute kidney injury.  Patient could not receive CT scan with contrast so CT abdomen pelvis done without to evaluate the left upper quadrant pain.  As well as CT chest was done.  Patient started on Lovenox since the D-dimer was almost 5.  Patient will require admission for either CTA if renal function improves with hydration overnight or VQ scan.  Hospitalist aware.  Overnight ED physician will follow up on the CT scan of the abdomen and contact them.  Assuming that there is no acute process in the abdomen then can be admitted to the hospitalist service.  Initially did give patient some IV fluids hoping that her GFR would improve to 40 and we could do the scans with contrast.  But after a liter of fluid it only improved slightly.  Final Clinical Impression(s) / ED Diagnoses Final diagnoses:  Left flank pain  Left-sided chest wall pain  Multiple myeloma not having achieved remission (HCC)  AKI (acute kidney injury) [facility])    Rx / DC Orders ED Discharge Orders    None       Fredia Sorrow, MD 07/03/20 [phone]

## 2020-07-03 ENCOUNTER — Encounter (HOSPITAL_COMMUNITY): Payer: Self-pay | Admitting: Internal Medicine

## 2020-07-03 ENCOUNTER — Emergency Department (HOSPITAL_COMMUNITY): Payer: Medicare Other

## 2020-07-03 ENCOUNTER — Other Ambulatory Visit: Payer: Self-pay

## 2020-07-03 DIAGNOSIS — E559 Vitamin D deficiency, unspecified: Secondary | ICD-10-CM | POA: Diagnosis not present

## 2020-07-03 DIAGNOSIS — E860 Dehydration: Secondary | ICD-10-CM | POA: Diagnosis present

## 2020-07-03 DIAGNOSIS — C9000 Multiple myeloma not having achieved remission: Secondary | ICD-10-CM | POA: Diagnosis not present

## 2020-07-03 DIAGNOSIS — J90 Pleural effusion, not elsewhere classified: Secondary | ICD-10-CM | POA: Diagnosis not present

## 2020-07-03 DIAGNOSIS — R7989 Other specified abnormal findings of blood chemistry: Secondary | ICD-10-CM

## 2020-07-03 DIAGNOSIS — N179 Acute kidney failure, unspecified: Secondary | ICD-10-CM | POA: Diagnosis present

## 2020-07-03 DIAGNOSIS — R590 Localized enlarged lymph nodes: Secondary | ICD-10-CM | POA: Diagnosis not present

## 2020-07-03 DIAGNOSIS — K5900 Constipation, unspecified: Secondary | ICD-10-CM | POA: Diagnosis not present

## 2020-07-03 DIAGNOSIS — Z79899 Other long term (current) drug therapy: Secondary | ICD-10-CM | POA: Diagnosis not present

## 2020-07-03 DIAGNOSIS — R1032 Left lower quadrant pain: Secondary | ICD-10-CM | POA: Diagnosis not present

## 2020-07-03 DIAGNOSIS — I10 Essential (primary) hypertension: Secondary | ICD-10-CM | POA: Diagnosis not present

## 2020-07-03 DIAGNOSIS — G893 Neoplasm related pain (acute) (chronic): Secondary | ICD-10-CM | POA: Diagnosis not present

## 2020-07-03 DIAGNOSIS — Z923 Personal history of irradiation: Secondary | ICD-10-CM | POA: Diagnosis not present

## 2020-07-03 DIAGNOSIS — F419 Anxiety disorder, unspecified: Secondary | ICD-10-CM | POA: Diagnosis present

## 2020-07-03 DIAGNOSIS — C9002 Multiple myeloma in relapse: Secondary | ICD-10-CM | POA: Diagnosis not present

## 2020-07-03 DIAGNOSIS — E1169 Type 2 diabetes mellitus with other specified complication: Secondary | ICD-10-CM | POA: Diagnosis not present

## 2020-07-03 DIAGNOSIS — E876 Hypokalemia: Secondary | ICD-10-CM | POA: Diagnosis not present

## 2020-07-03 DIAGNOSIS — E11628 Type 2 diabetes mellitus with other skin complications: Secondary | ICD-10-CM | POA: Diagnosis not present

## 2020-07-03 DIAGNOSIS — Z823 Family history of stroke: Secondary | ICD-10-CM | POA: Diagnosis not present

## 2020-07-03 DIAGNOSIS — E782 Mixed hyperlipidemia: Secondary | ICD-10-CM | POA: Diagnosis not present

## 2020-07-03 DIAGNOSIS — R0789 Other chest pain: Secondary | ICD-10-CM | POA: Diagnosis not present

## 2020-07-03 DIAGNOSIS — N132 Hydronephrosis with renal and ureteral calculous obstruction: Secondary | ICD-10-CM | POA: Diagnosis not present

## 2020-07-03 DIAGNOSIS — N1330 Unspecified hydronephrosis: Secondary | ICD-10-CM | POA: Diagnosis not present

## 2020-07-03 DIAGNOSIS — Z7189 Other specified counseling: Secondary | ICD-10-CM | POA: Diagnosis not present

## 2020-07-03 DIAGNOSIS — R1084 Generalized abdominal pain: Secondary | ICD-10-CM | POA: Diagnosis not present

## 2020-07-03 DIAGNOSIS — M5126 Other intervertebral disc displacement, lumbar region: Secondary | ICD-10-CM | POA: Diagnosis not present

## 2020-07-03 DIAGNOSIS — D499 Neoplasm of unspecified behavior of unspecified site: Secondary | ICD-10-CM | POA: Diagnosis not present

## 2020-07-03 DIAGNOSIS — E1165 Type 2 diabetes mellitus with hyperglycemia: Secondary | ICD-10-CM | POA: Diagnosis not present

## 2020-07-03 DIAGNOSIS — N139 Obstructive and reflux uropathy, unspecified: Secondary | ICD-10-CM | POA: Diagnosis not present

## 2020-07-03 DIAGNOSIS — F329 Major depressive disorder, single episode, unspecified: Secondary | ICD-10-CM | POA: Diagnosis present

## 2020-07-03 DIAGNOSIS — Z515 Encounter for palliative care: Secondary | ICD-10-CM | POA: Diagnosis not present

## 2020-07-03 DIAGNOSIS — I700 Atherosclerosis of aorta: Secondary | ICD-10-CM | POA: Diagnosis not present

## 2020-07-03 DIAGNOSIS — R109 Unspecified abdominal pain: Secondary | ICD-10-CM | POA: Diagnosis not present

## 2020-07-03 DIAGNOSIS — Z66 Do not resuscitate: Secondary | ICD-10-CM | POA: Diagnosis not present

## 2020-07-03 DIAGNOSIS — H409 Unspecified glaucoma: Secondary | ICD-10-CM | POA: Diagnosis present

## 2020-07-03 DIAGNOSIS — R609 Edema, unspecified: Secondary | ICD-10-CM | POA: Diagnosis not present

## 2020-07-03 DIAGNOSIS — E119 Type 2 diabetes mellitus without complications: Secondary | ICD-10-CM | POA: Diagnosis not present

## 2020-07-03 DIAGNOSIS — Z808 Family history of malignant neoplasm of other organs or systems: Secondary | ICD-10-CM | POA: Diagnosis not present

## 2020-07-03 DIAGNOSIS — C772 Secondary and unspecified malignant neoplasm of intra-abdominal lymph nodes: Secondary | ICD-10-CM | POA: Diagnosis not present

## 2020-07-03 DIAGNOSIS — Z20822 Contact with and (suspected) exposure to covid-19: Secondary | ICD-10-CM | POA: Diagnosis not present

## 2020-07-03 DIAGNOSIS — F17210 Nicotine dependence, cigarettes, uncomplicated: Secondary | ICD-10-CM | POA: Diagnosis not present

## 2020-07-03 DIAGNOSIS — E7849 Other hyperlipidemia: Secondary | ICD-10-CM | POA: Diagnosis not present

## 2020-07-03 DIAGNOSIS — N131 Hydronephrosis with ureteral stricture, not elsewhere classified: Secondary | ICD-10-CM | POA: Diagnosis present

## 2020-07-03 DIAGNOSIS — Z8249 Family history of ischemic heart disease and other diseases of the circulatory system: Secondary | ICD-10-CM | POA: Diagnosis not present

## 2020-07-03 DIAGNOSIS — J9811 Atelectasis: Secondary | ICD-10-CM | POA: Diagnosis not present

## 2020-07-03 DIAGNOSIS — N201 Calculus of ureter: Secondary | ICD-10-CM | POA: Diagnosis present

## 2020-07-03 DIAGNOSIS — Z833 Family history of diabetes mellitus: Secondary | ICD-10-CM | POA: Diagnosis not present

## 2020-07-03 LAB — CBC WITH DIFFERENTIAL/PLATELET
Abs Immature Granulocytes: 0.02 10*3/uL (ref 0.00–0.07)
Basophils Absolute: 0 10*3/uL (ref 0.0–0.1)
Basophils Relative: 0 %
Eosinophils Absolute: 0 10*3/uL (ref 0.0–0.5)
Eosinophils Relative: 0 %
HCT: 33 % — ABNORMAL LOW (ref 36.0–46.0)
Hemoglobin: 9.9 g/dL — ABNORMAL LOW (ref 12.0–15.0)
Immature Granulocytes: 1 %
Lymphocytes Relative: 9 %
Lymphs Abs: 0.3 10*3/uL — ABNORMAL LOW (ref 0.7–4.0)
MCH: 31.3 pg (ref 26.0–34.0)
MCHC: 30 g/dL (ref 30.0–36.0)
MCV: 104.4 fL — ABNORMAL HIGH (ref 80.0–100.0)
Monocytes Absolute: 0.6 10*3/uL (ref 0.1–1.0)
Monocytes Relative: 15 %
Neutro Abs: 2.7 10*3/uL (ref 1.7–7.7)
Neutrophils Relative %: 75 %
Platelets: 203 10*3/uL (ref 150–400)
RBC: 3.16 MIL/uL — ABNORMAL LOW (ref 3.87–5.11)
RDW: 15 % (ref 11.5–15.5)
WBC: 3.6 10*3/uL — ABNORMAL LOW (ref 4.0–10.5)
nRBC: 0 % (ref 0.0–0.2)

## 2020-07-03 LAB — COMPREHENSIVE METABOLIC PANEL
ALT: 12 U/L (ref 0–44)
AST: 20 U/L (ref 15–41)
Albumin: 2.7 g/dL — ABNORMAL LOW (ref 3.5–5.0)
Alkaline Phosphatase: 83 U/L (ref 38–126)
Anion gap: 12 (ref 5–15)
BUN: 21 mg/dL (ref 8–23)
CO2: 16 mmol/L — ABNORMAL LOW (ref 22–32)
Calcium: 7.4 mg/dL — ABNORMAL LOW (ref 8.9–10.3)
Chloride: 110 mmol/L (ref 98–111)
Creatinine, Ser: 1.4 mg/dL — ABNORMAL HIGH (ref 0.44–1.00)
GFR calc Af Amer: 44 mL/min — ABNORMAL LOW (ref >60)
GFR calc non Af Amer: 38 mL/min — ABNORMAL LOW (ref >60)
Glucose, Bld: 137 mg/dL — ABNORMAL HIGH (ref 70–99)
Potassium: 4.6 mmol/L (ref 3.5–5.1)
Sodium: 138 mmol/L (ref 135–145)
Total Bilirubin: 0.8 mg/dL (ref 0.3–1.2)
Total Protein: 7.6 g/dL (ref 6.5–8.1)

## 2020-07-03 LAB — TROPONIN I (HIGH SENSITIVITY): Troponin I (High Sensitivity): 10 ng/L (ref <18)

## 2020-07-03 LAB — GLUCOSE, CAPILLARY: Glucose-Capillary: 111 mg/dL — ABNORMAL HIGH (ref 70–99)

## 2020-07-03 LAB — SARS CORONAVIRUS 2 BY RT PCR (HOSPITAL ORDER, PERFORMED IN ~~LOC~~ HOSPITAL LAB): SARS Coronavirus 2: NEGATIVE

## 2020-07-03 LAB — CBG MONITORING, ED: Glucose-Capillary: 130 mg/dL — ABNORMAL HIGH (ref 70–99)

## 2020-07-03 MED ORDER — INSULIN ASPART 100 UNIT/ML ~~LOC~~ SOLN: 0.0000 [IU] | Freq: Three times a day (TID) | SUBCUTANEOUS | Status: DC

## 2020-07-03 MED ORDER — ACETAMINOPHEN 650 MG RE SUPP: 650.0000 mg | Freq: Four times a day (QID) | RECTAL | Status: DC | PRN

## 2020-07-03 MED ORDER — INSULIN ASPART 100 UNIT/ML ~~LOC~~ SOLN
0.0000 [IU] | Freq: Every day | SUBCUTANEOUS | Status: DC
  Administered 2020-07-09 – 2020-07-11 (×2): 2 [IU] via SUBCUTANEOUS

## 2020-07-03 MED ORDER — LABETALOL HCL 5 MG/ML IV SOLN
10.0000 mg | INTRAVENOUS | Status: DC | PRN
  Filled 2020-07-03: qty 4

## 2020-07-03 MED ORDER — HYDROMORPHONE HCL 1 MG/ML IJ SOLN
1.0000 mg | INTRAMUSCULAR | Status: DC | PRN
  Administered 2020-07-03 – 2020-07-10 (×20): 1 mg via INTRAVENOUS
  Filled 2020-07-03 (×21): qty 1

## 2020-07-03 MED ORDER — HYDROMORPHONE HCL 1 MG/ML IJ SOLN
INTRAMUSCULAR | Status: AC
  Administered 2020-07-03: 1 mg
  Filled 2020-07-03: qty 1

## 2020-07-03 MED ORDER — ALPRAZOLAM 0.5 MG PO TABS: 0.5000 mg | ORAL_TABLET | Freq: Every evening | ORAL | Status: DC | PRN

## 2020-07-03 MED ORDER — ONDANSETRON HCL 4 MG PO TABS: 4.0000 mg | ORAL_TABLET | Freq: Four times a day (QID) | ORAL | Status: DC | PRN

## 2020-07-03 MED ORDER — METOPROLOL TARTRATE 5 MG/5ML IV SOLN: 5.0000 mg | Freq: Four times a day (QID) | INTRAVENOUS | Status: DC | PRN

## 2020-07-03 MED ORDER — ONDANSETRON HCL 4 MG/2ML IJ SOLN
4.0000 mg | Freq: Four times a day (QID) | INTRAMUSCULAR | Status: DC | PRN
  Administered 2020-07-03 – 2020-07-13 (×3): 4 mg via INTRAVENOUS
  Filled 2020-07-03 (×3): qty 2

## 2020-07-03 MED ORDER — HYDROMORPHONE HCL 1 MG/ML IJ SOLN
1.0000 mg | INTRAMUSCULAR | Status: DC | PRN
  Administered 2020-07-03 (×4): 1 mg via INTRAVENOUS
  Filled 2020-07-03 (×4): qty 1

## 2020-07-03 MED ORDER — LATANOPROST 0.005 % OP SOLN
1.0000 [drp] | Freq: Every day | OPHTHALMIC | Status: DC
  Administered 2020-07-03 – 2020-07-12 (×10): 1 [drp] via OPHTHALMIC
  Filled 2020-07-03: qty 2.5

## 2020-07-03 MED ORDER — SODIUM CHLORIDE 0.9 % IV BOLUS
1000.0000 mL | Freq: Once | INTRAVENOUS | Status: AC
  Administered 2020-07-03: 1000 mL via INTRAVENOUS

## 2020-07-03 MED ORDER — LORAZEPAM 2 MG/ML IJ SOLN: 1.0000 mg | Freq: Four times a day (QID) | INTRAMUSCULAR | Status: DC | PRN

## 2020-07-03 MED ORDER — HYDROCODONE-ACETAMINOPHEN 10-325 MG PO TABS
1.0000 | ORAL_TABLET | Freq: Four times a day (QID) | ORAL | Status: DC | PRN
  Administered 2020-07-04 – 2020-07-05 (×3): 1 via ORAL
  Filled 2020-07-03 (×4): qty 1

## 2020-07-03 MED ORDER — ACETAMINOPHEN 325 MG PO TABS
650.0000 mg | ORAL_TABLET | Freq: Four times a day (QID) | ORAL | Status: DC | PRN
  Administered 2020-07-05 (×2): 650 mg via ORAL
  Filled 2020-07-03 (×2): qty 2

## 2020-07-03 MED ORDER — ENOXAPARIN SODIUM 80 MG/0.8ML ~~LOC~~ SOLN
1.0000 mg/kg | Freq: Two times a day (BID) | SUBCUTANEOUS | Status: DC
  Administered 2020-07-03: 70 mg via SUBCUTANEOUS
  Filled 2020-07-03 (×2): qty 0.8

## 2020-07-03 MED ORDER — CYCLOBENZAPRINE HCL 10 MG PO TABS
10.0000 mg | ORAL_TABLET | Freq: Three times a day (TID) | ORAL | Status: DC | PRN
  Administered 2020-07-03: 10 mg via ORAL
  Filled 2020-07-03 (×2): qty 1

## 2020-07-03 NOTE — H&P (Signed)
History and Physical    Vicki Moon IHW:[id number] DOB: 05-Feb-1951 DOA: 07/02/2020  PCP: [name], MD   Patient coming from: Home.  I have personally briefly reviewed patient's old medical records in [place]  Chief Complaint: Flank pain.  HPI: Vicki Moon is a 69 y.o. female with medical history significant of type 2 diabetes, multiple myeloma, glaucoma, herniated lumbar disc, erythropoietin deficiency anemia, anxiety who is coming to the emergency department due to progressively worse chronic left flank, LUQ and left mid back area pain since Friday.  She denies fever, chills, night sweats, emesis, but complains of nausea.  Denies diarrhea, constipation, melena or hematochezia.  No dysuria, frequency or hematuria.  No rhinorrhea, sore throat, wheezing or hemoptysis.  She denies chest pain, palpitations, dizziness, diaphoresis, PND, orthopnea or pitting edema lower extremities.  ED Course: Initial vital signs temperature 98.6 F, pulse 95, respiration 18, blood pressure 113/58 mmHg and O2 sat 99% on room air.  The patient received IV fluids, antiemetics and analgesics in the emergency department.  CBC showed a white count of 3.7, hemoglobin 11.6 g/dL and platelets 248.  Urine analysis still pending.  CMP showed a CO2 of 21 mmol/L, all other electrolytes are within normal range when calcium is corrected to albumin.  Glucose 125, BUN 21 and creatinine 1.52 mg/dL.  Total protein is 8.7 albumin 3.0 g/dL.  The rest of the LFTs are within normal range.  Troponin x2 and lipase within normal range.  Imaging: Please see images and extensive reports below.  Review of Systems: As per HPI otherwise all other systems reviewed and are negative.  Past Medical History:  Diagnosis Date  . Diabetes mellitus without complication (HCC)    Type 2 IDDM x 5 years  . 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 Glaucoma   . Goals of care, counseling/discussion 04/29/2019  . Herniated  lumbar intervertebral disc 06/2014  . Multiple myeloma ([place]) 04/29/2019  . PONV (postoperative nausea and vomiting)     Past Surgical History:  Procedure Laterality Date  . CHOLECYSTECTOMY    . COLONOSCOPY N/A 06/28/2015   Procedure: COLONOSCOPY;  Surgeon: Rogene Houston, MD;  [facility];  Service: Endoscopy;  Laterality: N/A;  155  . EYE SURGERY    . IR BONE MARROW BIOPSY & ASPIRATION  03/16/2020  . IR BONE TUMOR(S)RF ABLATION  04/28/2019  . IR KYPHO LUMBAR INC FX REDUCE BONE BX UNI/BIL CANNULATION INC/IMAGING  04/28/2019  . IR RADIOLOGIST EVAL & MGMT  04/22/2019  . LUMBAR LAMINECTOMY Right 07/12/2014   Procedure: LUMBAR FIVE TO SACRAL ONE MICRODISCECTOMY;  Surgeon: Marybelle Killings, MD;  Location: [place];  Service: Orthopedics;  Laterality: Right;  . TUBAL LIGATION    . VITRECTOMY 25 GAUGE WITH SCLERAL BUCKLE Left 07/29/2017   Procedure: RETINAL DETACHMENT REPAIR LEFT EYE WITH PARSPLANA VITRECTOMY, AIR FLUID EXCHANGE, ENDO LASTER, DRAINAGE OF SUBRETINAL FLUID,ENDOLASER PPV /25 GAUGE;  Surgeon: Jalene Mullet, MD;  Location: [facility] [facility];  Service: Ophthalmology;  Laterality: Left;    Social History  reports that she has been smoking cigarettes. She has a 10.00 pack-year smoking history. She has never used smokeless tobacco. She reports that she does not drink alcohol and does not use drugs.  Allergies  Allergen Reactions  . Metformin Diarrhea and Nausea Only    Family History  Problem Relation Age of Onset  . Hypertension Mother   . Stroke Mother   . Cancer Father  brain tumor  . Diabetes Father    Prior to Admission medications   Medication Sig Start Date End Date Taking? Authorizing Provider  ALPRAZolam Duanne Moron) 0.5 MG tablet Take 0.5 mg by mouth at bedtime as needed for sleep.  05/10/19  Yes [provider]  cyclobenzaprine (FLEXERIL) 10 MG tablet Take 1 tablet (10 mg total) by mouth 3 (three) times daily as needed for muscle spasms. 05/28/20  Yes Ennever, Rudell Cobb, MD    FARXIGA 10 MG TABS tablet Take 10 mg by mouth 2 (two) times a week.  04/18/20  Yes [provider]  fentaNYL (DURAGESIC) 25 MCG/HR Place 1 patch onto the skin every other day. 06/21/20  Yes Ennever, Rudell Cobb, MD  HYDROcodone-acetaminophen (NORCO) 10-325 MG tablet Take 1 tablet by mouth every 6 (six) hours as needed.   Yes [provider]  latanoprost (XALATAN) 0.005 % ophthalmic solution Place 1 drop into both eyes at bedtime.   Yes [provider]  NOVOLOG 100 UNIT/ML injection Inject 1.2-25 Units into the skin as directed. PATIENT USES INSULIN PUMP  Patient has a set basal rate of 1.2-1.5u per hour with meal time boluses of up to 25 units 05/06/19  Yes [provider]  ondansetron (ZOFRAN) 8 MG tablet Take 1 tablet (8 mg total) by mouth every 8 (eight) hours as needed for nausea or vomiting. 11/30/19  Yes [name], MD  PARoxetine (PAXIL) 30 MG tablet Take 30 mg by mouth every morning. 07/12/18  Yes [provider]  temazepam (RESTORIL) 15 MG capsule Take 1 capsule (15 mg total) by mouth at bedtime as needed for sleep. 05/08/20  Yes Ennever, Rudell Cobb, MD  oxycodone-ibuprofen (COMBUNOX) 5-400 MG tablet Take 1 tablet by mouth every 6 (six) hours as needed for pain. Patient not taking: Reported on 07/02/2020 06/21/20   [name], MD  venetoclax 100 MG TABS Take 200 mg by mouth daily. Patient not taking: Reported on 07/02/2020 03/23/20   [name], MD   Physical Exam: Vitals:   07/02/20 2100 07/02/20 2200 07/03/20 0000 07/03/20 0200  BP:  (!) 147/64 134/63 125/63  Pulse: 92 93 85 87  Resp: (!) 22 (!) _0 Temp:      TempSrc:      SpO2: 97% 96% 97% 96%  Weight:      Height:       Constitutional: Looks chronically ill. Eyes: PERRL, lids and conjunctivae mildly injected. ENMT: Mucous membranes are dry. Posterior pharynx clear of any exudate or lesions. Neck: normal, supple, no masses, no thyromegaly Respiratory: Decreased breath  sounds on bases, otherwise clear to auscultation bilaterally, no wheezing, no crackles. Normal respiratory effort. No accessory muscle use.  Cardiovascular: Regular rate and rhythm, no murmurs / rubs / gallops. No extremity edema. 2+ pedal pulses. No carotid bruits.  Abdomen: Positive LUQ, left flank and LLQ tenderness, no guarding or rebound.  No masses palpated. No hepatosplenomegaly. Bowel sounds positive.  Musculoskeletal: no clubbing / cyanosis. Good ROM, no contractures. Normal muscle tone.  Skin: no rashes, lesions, ulcers on limited dermatological examination. Neurologic: CN 2-12 grossly intact. Sensation intact, DTR normal. Strength 5/5 in all 4.  Psychiatric: Normal judgment and insight. Alert and oriented x 3. Normal mood.   Labs on Admission: I have personally reviewed following labs and imaging studies  CBC: Recent Labs  Lab 07/02/20 1705  WBC 3.7*  NEUTROABS 2.8  HGB 11.6*  HCT 36.1  MCV 98.4  PLT 248  Basic Metabolic Panel: Recent Labs  Lab 07/02/20 1705 07/02/20 2134  NA 136 136  K 4.6 4.4  CL 104 109  CO2 21* 17*  GLUCOSE 125* 138*  BUN 21 21  CREATININE 1.52* 1.46*  CALCIUM 8.6* 7.6*   GFR: Estimated Creatinine Clearance: 32.4 mL/min (A) (by C-G formula based on SCr of 1.46 mg/dL (H)).  Liver Function Tests: Recent Labs  Lab 07/02/20 1705  AST 22  ALT 14  ALKPHOS 93  BILITOT 0.8  PROT 8.7*  ALBUMIN 3.0*   Radiological Exams on Admission: CT Abdomen Pelvis Wo Contrast  Addendum Date: 07/03/2020   ADDENDUM REPORT: 07/03/2020 01:09 ADDENDUM: Study discussed by telephone with Dr. Dina Rich in the ED on 07/03/2020 at 0103 hours. Electronically Signed   By: Genevie Ann M.D.   On: 07/03/2020 01:09   Result Date: 07/03/2020 CLINICAL DATA:  69 year old female with chest pain. Increased chest and left lower abdominal pain for 3 days. Multiple myeloma. Ultrasound-guided lymph node biopsy last month positive for metastatic myeloma. EXAM: CT CHEST, ABDOMEN AND  PELVIS WITHOUT CONTRAST TECHNIQUE: Multidetector CT imaging of the chest, abdomen and pelvis was performed following the standard protocol without IV contrast. COMPARISON:  Chest CT 05/08/2020 and earlier. CT Abdomen and Pelvis 06/07/2019. FINDINGS: CT CHEST FINDINGS Cardiovascular: Calcified aortic atherosclerosis. Vascular patency is not evaluated in the absence of IV contrast. No cardiomegaly or pericardial effusion. Mediastinum/Nodes: Large posterior mediastinal soft tissue mass which engulfs the descending thoracic aorta (series 2, image 40) and is contiguous with mass continuing along the left posterior lung and pleural margin. This now encompasses about 9 cm long axis and appears progressed from a much smaller 4 cm left paraspinal soft tissue mass on 05/08/2020. this abnormal soft tissue also continues inferiorly and is contiguous with bulky retroperitoneal/prevertebral tumor in the upper abdomen which is up to 4 cm in thickness (2 cm in May). No superimposed superior or middle mediastinal lymphadenopathy. No definite hilar lymphadenopathy. Lungs/Pleura: Posterior left lung/pleural tumor contiguous with the posterior mediastinum. Superimposed mild atelectasis. Major airways remain patent. There is also a small but increased rib or pleural based tumor in the anterior left lung on series 3, image 59 measuring about 7 mm (punctate in May). No superimposed hematogenous lung metastasis identified. Musculoskeletal: Diffusely abnormal bone mineralization. Widespread subtle lytic lesions in the ribs, especially the left ribs 3 through 7. Stable mild pathologic compression fracture of the T2 body. No lower thoracic vertebral destruction despite the bulky prevertebral/paraspinal tumor described above. CT ABDOMEN PELVIS FINDINGS Hepatobiliary: Chronically absent gallbladder. Negative noncontrast liver. Pancreas: Negative. Spleen: Negative, although partially inseparable from large left paraspinal and retrocrural tumor.  Adrenals/Urinary Tract: Adrenal glands remain normal. Negative noncontrast right kidney and right ureter. Left hydronephrosis and hydroureter, moderate to severe, secondary to likely tumor obstruction of the distal ureter in the pelvis (series 2, image 96) where a bulky 7 cm left presacral soft tissue mass is inseparable from the distal left ureter and the left vaginal fornix. But furthermore, there may also be a superimposed 3 mm distal left ureteral calculus on series 2, image 98, uncertain. No other nephrolithiasis is identified. Both the left presacral tumor and right lower pelvic intramuscular tumor do not appear to directly involve the urinary bladder which is mildly distended. Stomach/Bowel: Extensive diverticulosis of the large bowel, especially the descending and sigmoid colon but no active inflammation. Normal appendix. No dilated small bowel. No free air or free fluid. Vascular/Lymphatic: Thoracoabdominal aorta is engulfed by tumor from the descending  segment through the renal artery level. Vascular patency is not evaluated in the absence of IV contrast. Superimposed Calcified aortic atherosclerosis. Relatively mild superimposed discrete retroperitoneal lymphadenopathy. Reproductive: Left pelvic tumor partially inseparable from the vaginal fornix. Otherwise negative noncontrast uterus and adnexa. Other: No pelvic free fluid. Musculoskeletal: Widespread abnormal bone mineralization, new since 2020. Previous augmentation of L2. No new lumbar compression fracture. No upper lumbar vertebral erosion despite bulky prevertebral/retroperitoneal soft tissue tumor. Infiltrated sacral ala, which may be the source of a large left-side presacral pelvic tumor described in conjunction with the left ureter above. There is also bulky but indistinct intramuscular tumor along the medial right hip (series 2, image 118). And a small 2 cm focus of extraosseous tumor along the left posterior iliac wing on series 2, image 85.  IMPRESSION: 1. Severe progression of multiple myeloma since May, most notable for: - bulky and contiguous posterior mediastinal, left posterior pleural, left retrocrural, and retroperitoneal tumor engulfs the Aorta from the descending segment through the level of the renal arteries. Aortic Invasion and/or patency not evaluated in the absence of IV contrast. - bulky left pelvic/presacral tumor which is probably obstructing the distal left ureter, although there might possibly be a superimposed 3 mm distal left ureteral calculus also (series 2, image 98). 2. Additional bulky intramuscular tumor at the medial right hip musculature. Underlying diffuse skeletal myelomatous involvement. Electronically Signed: By: Genevie Ann M.D. On: 07/03/2020 00:53   DG Chest 2 View  Result Date: 07/02/2020 CLINICAL DATA:  Chest pain. EXAM: CHEST - 2 VIEW COMPARISON:  Chest CT 05/08/2020 FINDINGS: The cardiomediastinal contours are normal. The lungs are clear. Pulmonary vasculature is normal. No consolidation, pleural effusion, or pneumothorax. Right scapular lesion on CT is not well seen by radiograph. Callus about left rib fractures, lytic lesions seen on prior chest CT. Callus about few anterior right ribs. Endplate irregularity superior aspect of tentatively L2, chronic based on 06/07/2019 abdominal CT. IMPRESSION: 1. No acute radiographic findings. 2. Majority of the bone lesions on prior CT are not well seen by radiograph. Callus about left greater than right rib fractures, lytic lesions seen on prior chest CT. Electronically Signed   By: Keith Rake M.D.   On: 07/02/2020 17:37   CT Chest Wo Contrast  Addendum Date: 07/03/2020   ADDENDUM REPORT: 07/03/2020 01:09 ADDENDUM: Study discussed by telephone with Dr. Dina Rich in the ED on 07/03/2020 at 0103 hours. Electronically Signed   By: Genevie Ann M.D.   On: 07/03/2020 01:09   Result Date: 07/03/2020 CLINICAL DATA:  69 year old female with chest pain. Increased chest and left  lower abdominal pain for 3 days. Multiple myeloma. Ultrasound-guided lymph node biopsy last month positive for metastatic myeloma. EXAM: CT CHEST, ABDOMEN AND PELVIS WITHOUT CONTRAST TECHNIQUE: Multidetector CT imaging of the chest, abdomen and pelvis was performed following the standard protocol without IV contrast. COMPARISON:  Chest CT 05/08/2020 and earlier. CT Abdomen and Pelvis 06/07/2019. FINDINGS: CT CHEST FINDINGS Cardiovascular: Calcified aortic atherosclerosis. Vascular patency is not evaluated in the absence of IV contrast. No cardiomegaly or pericardial effusion. Mediastinum/Nodes: Large posterior mediastinal soft tissue mass which engulfs the descending thoracic aorta (series 2, image 40) and is contiguous with mass continuing along the left posterior lung and pleural margin. This now encompasses about 9 cm long axis and appears progressed from a much smaller 4 cm left paraspinal soft tissue mass on 05/08/2020. this abnormal soft tissue also continues inferiorly and is contiguous with bulky retroperitoneal/prevertebral tumor in the upper  abdomen which is up to 4 cm in thickness (2 cm in May). No superimposed superior or middle mediastinal lymphadenopathy. No definite hilar lymphadenopathy. Lungs/Pleura: Posterior left lung/pleural tumor contiguous with the posterior mediastinum. Superimposed mild atelectasis. Major airways remain patent. There is also a small but increased rib or pleural based tumor in the anterior left lung on series 3, image 59 measuring about 7 mm (punctate in May). No superimposed hematogenous lung metastasis identified. Musculoskeletal: Diffusely abnormal bone mineralization. Widespread subtle lytic lesions in the ribs, especially the left ribs 3 through 7. Stable mild pathologic compression fracture of the T2 body. No lower thoracic vertebral destruction despite the bulky prevertebral/paraspinal tumor described above. CT ABDOMEN PELVIS FINDINGS Hepatobiliary: Chronically absent  gallbladder. Negative noncontrast liver. Pancreas: Negative. Spleen: Negative, although partially inseparable from large left paraspinal and retrocrural tumor. Adrenals/Urinary Tract: Adrenal glands remain normal. Negative noncontrast right kidney and right ureter. Left hydronephrosis and hydroureter, moderate to severe, secondary to likely tumor obstruction of the distal ureter in the pelvis (series 2, image 96) where a bulky 7 cm left presacral soft tissue mass is inseparable from the distal left ureter and the left vaginal fornix. But furthermore, there may also be a superimposed 3 mm distal left ureteral calculus on series 2, image 98, uncertain. No other nephrolithiasis is identified. Both the left presacral tumor and right lower pelvic intramuscular tumor do not appear to directly involve the urinary bladder which is mildly distended. Stomach/Bowel: Extensive diverticulosis of the large bowel, especially the descending and sigmoid colon but no active inflammation. Normal appendix. No dilated small bowel. No free air or free fluid. Vascular/Lymphatic: Thoracoabdominal aorta is engulfed by tumor from the descending segment through the renal artery level. Vascular patency is not evaluated in the absence of IV contrast. Superimposed Calcified aortic atherosclerosis. Relatively mild superimposed discrete retroperitoneal lymphadenopathy. Reproductive: Left pelvic tumor partially inseparable from the vaginal fornix. Otherwise negative noncontrast uterus and adnexa. Other: No pelvic free fluid. Musculoskeletal: Widespread abnormal bone mineralization, new since 2020. Previous augmentation of L2. No new lumbar compression fracture. No upper lumbar vertebral erosion despite bulky prevertebral/retroperitoneal soft tissue tumor. Infiltrated sacral ala, which may be the source of a large left-side presacral pelvic tumor described in conjunction with the left ureter above. There is also bulky but indistinct intramuscular  tumor along the medial right hip (series 2, image 118). And a small 2 cm focus of extraosseous tumor along the left posterior iliac wing on series 2, image 85. IMPRESSION: 1. Severe progression of multiple myeloma since May, most notable for: - bulky and contiguous posterior mediastinal, left posterior pleural, left retrocrural, and retroperitoneal tumor engulfs the Aorta from the descending segment through the level of the renal arteries. Aortic Invasion and/or patency not evaluated in the absence of IV contrast. - bulky left pelvic/presacral tumor which is probably obstructing the distal left ureter, although there might possibly be a superimposed 3 mm distal left ureteral calculus also (series 2, image 98). 2. Additional bulky intramuscular tumor at the medial right hip musculature. Underlying diffuse skeletal myelomatous involvement. Electronically Signed: By: Odessa Fleming M.D. On: 07/03/2020 00:53   EKG: Independently reviewed. Vent. rate 86 BPM PR interval * ms QRS duration 100 ms QT/QTc 396/474 ms P-R-T axes -20 -53 57 Sinus rhythm Borderline short PR interval LAD, consider left anterior fascicular block Baseline wander in lead(s) V1  Assessment/Plan Principal Problem:   Hydronephrosis of left kidney Observation/telemetry. Continue IV hydration. Analgesics as needed. May transfer to [facility] for urology evaluation.  Active Problems:   Elevated d dimer Received full dose Lovenox last night. Check renal function in a.m. If improved, consider CTA chest. If renal function still impaired, consider VQ scan.    AKI (acute kidney injury) ([place]) Secondary to decreased oral intake. Continue IV hydration. Follow-up renal function electrolytes.    Multiple myeloma [facility]) Consult oncology after arrival to [facility]. May need radiation oncology evaluation as well.    Essential hypertension, benign Metoprolol as needed. Monitor blood pressure and heart rate.    Type 2 diabetes mellitus  (HCC) Currently NPO. CBG monitoring every 6 hours.    Depression Resume paroxetine 30 mg p.o. daily.    Glaucoma Continue Xalatan drops    DVT prophylaxis: On full dose Lovenox. Code Status:   Full code Family Communication:   Disposition Plan:   Patient is from:  Home.  Anticipated DC to:  Home.  Anticipated DC date:  07/04/2020.  Anticipated DC barriers: Clinical improvement/work-up results.  Consults called:   Admission status:    Observation/telemetry.    Severity of Illness:High to very high.  Reubin Milan MD [facility]  How to contact the [facility] Attending or Consulting provider [place] or covering provider during after hours [place], for this patient?   1. Check the care team in [facility] and look for a) attending/consulting [facility] provider listed and b) the [facility] team listed 2. Log into [email or web address] and use [place]'s universal password to access. If you do not have the password, please contact the hospital operator. 3. Locate the [facility] provider you are looking for under [facility] and page to a number that you can be directly reached. 4. If you still have difficulty reaching the provider, please page the [facility] (Director on Call) for the Hospitalists listed on amion for assistance.  07/03/2020, 2:54 AM   This document was prepared using Dragon voice recognition software and may contain some unintended transcription errors.

## 2020-07-03 NOTE — ED Provider Notes (Signed)
Patient signed out pending CT.  In brief, patient with history of multiple myeloma.  Flank pain and chest pain.  Positive D-dimer but creatinine acutely increased.  1:12 AM CT reviewed with radiologist.  Radiologist with concern for extensive progression of disease and tumor burden now engulfing aorta and left ureter.  With a noncontrasted scan, unclear whether there is any impingement on the aorta.  Patient is hemodynamically stable.  On my repeat evaluation, she reports her pain is controlled.  I did discuss with her CT findings of significant progression of disease.  Patient stated understanding.  At this time we will hold off on contrasted imaging study.  Her creatinine may not recover if it is an obstructive process; however, after hydration she may have improvement of creatinine in order to get contrasted study.  Will discuss with admitting hospitalist.  Believe she would be best served at [facility].  Primary oncologist, Dr. [name].   Merryl Hacker, MD 07/03/20 [phone]

## 2020-07-03 NOTE — Progress Notes (Addendum)
Patient seen and evaluated, chart reviewed, please see EMR for updated orders. Please see full H&P dictated by admitting physician Dr. Olevia Bowens for same date of service.   Brief Summary:- 69 y.o. female with medical history significant of type 2 diabetes, multiple myeloma s/p prior radiation therapy,, glaucoma, herniated lumbar disc, erythropoietin deficiency anemia, anxiety  admitted on 07/03/2020 with worsening abdominal pain with imaging finding of severe progression of multiple myeloma since May 2021 with aortic invasion and underlying diffuse skeletal myelomatous involvement and possible left-sided hydronephrosis due to tumor obstructing the distal left ureter, cannot rule out left ureteral calculus   A/p 1)Multiple myeloma with abdominal pain--- significantly worse due to  severe progression of multiple myeloma since May 2021--- patient previously completed radiation therapy -Transfer to [facility] for further medical oncology and radiation oncology evaluation =-Continue pain control  2) left-sided hydronephrosis-- due to tumor obstructing the distal left ureter, cannot rule out left ureteral calculus----please consider urology consult when patient arrives at [place]  3) chronic anemia in the setting of underlying multiple myeloma--H&H appears to be at baseline  4)DM2-hold Farxiga, Use Novolog/Humalog Sliding scale insulin with Accu-Cheks/Fingersticks as ordered   5)AKI----acute kidney injury --due to dehydration, cannot exclude possible left-sided hydronephrosis with possible obstructive uropathy contributing -- creatinine on admission= 1.52 , baseline creatinine = 0.8 to 1.0    , creatinine is now= 1.40 ,  ---renally adjust medications, avoid nephrotoxic agents / dehydration  / hypotension -If renal function fails to improve as anticipated with gentle hydration please consider urology consult for possible left-sided hydronephrosis/obstructive uropathy  6) elevated  D-dimer--- in the setting of underlying multiple myeloma--- clinical index of suspicion for PE is not high -Get venous Dopplers of the lower extremities, -Consider CTA chest if renal function improves further -Currently on full dose Lovenox  Patient seen and evaluated, chart reviewed, please see EMR for updated orders. Please see full H&P dictated by admitting physician Dr. Olevia Bowens for same date of service   Over 42 minutes spent direct patient care and care coordination

## 2020-07-03 NOTE — ED Notes (Signed)
Pt to CT

## 2020-07-04 ENCOUNTER — Inpatient Hospital Stay: Payer: Medicare Other

## 2020-07-04 ENCOUNTER — Inpatient Hospital Stay (HOSPITAL_COMMUNITY): Payer: Medicare Other

## 2020-07-04 ENCOUNTER — Inpatient Hospital Stay (HOSPITAL_COMMUNITY): Payer: Medicare Other | Admitting: Anesthesiology

## 2020-07-04 ENCOUNTER — Encounter (HOSPITAL_COMMUNITY)
Admission: EM | Disposition: A | Discharge status: Hospice | Payer: Self-pay | Source: Home / Self Care | Attending: Internal Medicine

## 2020-07-04 ENCOUNTER — Inpatient Hospital Stay: Payer: Medicare Other | Admitting: Family

## 2020-07-04 DIAGNOSIS — R609 Edema, unspecified: Secondary | ICD-10-CM

## 2020-07-04 HISTORY — PX: CYSTOSCOPY/URETEROSCOPY/HOLMIUM LASER/STENT PLACEMENT: SHX6546

## 2020-07-04 LAB — GLUCOSE, CAPILLARY
Glucose-Capillary: 102 mg/dL — ABNORMAL HIGH (ref 70–99)
Glucose-Capillary: 146 mg/dL — ABNORMAL HIGH (ref 70–99)
Glucose-Capillary: 130 mg/dL — ABNORMAL HIGH (ref 70–99)
Glucose-Capillary: 149 mg/dL — ABNORMAL HIGH (ref 70–99)

## 2020-07-04 LAB — MRSA PCR SCREENING: MRSA by PCR: NEGATIVE

## 2020-07-04 SURGERY — CYSTOSCOPY/URETEROSCOPY/HOLMIUM LASER/STENT PLACEMENT
Anesthesia: General | Site: Ureter | Laterality: Left

## 2020-07-04 MED ORDER — CEFAZOLIN SODIUM-DEXTROSE 1-4 GM/50ML-% IV SOLN: 1.0000 g | INTRAVENOUS | Status: DC

## 2020-07-04 MED ORDER — FENTANYL CITRATE (PF) 100 MCG/2ML IJ SOLN: 25.0000 ug | INTRAMUSCULAR | Status: DC | PRN

## 2020-07-04 MED ORDER — SODIUM CHLORIDE 0.9 % IR SOLN
Status: DC | PRN
  Administered 2020-07-04: 1000 mL

## 2020-07-04 MED ORDER — HEPARIN SODIUM (PORCINE) 5000 UNIT/ML IJ SOLN
5000.0000 [IU] | Freq: Three times a day (TID) | INTRAMUSCULAR | Status: DC
  Administered 2020-07-04 – 2020-07-13 (×26): 5000 [IU] via SUBCUTANEOUS
  Filled 2020-07-04 (×27): qty 1

## 2020-07-04 MED ORDER — IOHEXOL 300 MG/ML  SOLN
INTRAMUSCULAR | Status: DC | PRN
  Administered 2020-07-04: 50 mL

## 2020-07-04 MED ORDER — CEFAZOLIN SODIUM-DEXTROSE 2-4 GM/100ML-% IV SOLN
2.0000 g | INTRAVENOUS | Status: AC
  Administered 2020-07-04: 2 g via INTRAVENOUS

## 2020-07-04 MED ORDER — FENTANYL CITRATE (PF) 100 MCG/2ML IJ SOLN
INTRAMUSCULAR | Status: AC
  Filled 2020-07-04: qty 2

## 2020-07-04 MED ORDER — LIDOCAINE 2% (20 MG/ML) 5 ML SYRINGE
INTRAMUSCULAR | Status: DC | PRN
  Administered 2020-07-04: 60 mg via INTRAVENOUS

## 2020-07-04 MED ORDER — SUCCINYLCHOLINE CHLORIDE 200 MG/10ML IV SOSY
PREFILLED_SYRINGE | INTRAVENOUS | Status: AC
  Filled 2020-07-04: qty 10

## 2020-07-04 MED ORDER — LIDOCAINE 2% (20 MG/ML) 5 ML SYRINGE
INTRAMUSCULAR | Status: AC
  Filled 2020-07-04: qty 5

## 2020-07-04 MED ORDER — CEFAZOLIN SODIUM-DEXTROSE 1-4 GM/50ML-% IV SOLN
1.0000 g | INTRAVENOUS | Status: DC
  Filled 2020-07-04: qty 50

## 2020-07-04 MED ORDER — LACTATED RINGERS IV SOLN: INTRAVENOUS | Status: DC

## 2020-07-04 MED ORDER — SUCCINYLCHOLINE CHLORIDE 200 MG/10ML IV SOSY
PREFILLED_SYRINGE | INTRAVENOUS | Status: DC | PRN
  Administered 2020-07-04: 70 mg via INTRAVENOUS

## 2020-07-04 MED ORDER — PROPOFOL 10 MG/ML IV BOLUS
INTRAVENOUS | Status: AC
  Filled 2020-07-04: qty 20

## 2020-07-04 MED ORDER — ONDANSETRON HCL 4 MG/2ML IJ SOLN: 4.0000 mg | Freq: Once | INTRAMUSCULAR | Status: DC | PRN

## 2020-07-04 MED ORDER — LACTATED RINGERS IV SOLN: INTRAVENOUS | Status: DC | PRN

## 2020-07-04 MED ORDER — ONDANSETRON HCL 4 MG/2ML IJ SOLN
INTRAMUSCULAR | Status: DC | PRN
  Administered 2020-07-04: 4 mg via INTRAVENOUS

## 2020-07-04 MED ORDER — CEFAZOLIN SODIUM-DEXTROSE 2-4 GM/100ML-% IV SOLN
INTRAVENOUS | Status: AC
  Filled 2020-07-04: qty 100

## 2020-07-04 MED ORDER — DEXAMETHASONE SODIUM PHOSPHATE 10 MG/ML IJ SOLN
INTRAMUSCULAR | Status: DC | PRN
  Administered 2020-07-04: 4 mg via INTRAVENOUS

## 2020-07-04 MED ORDER — FENTANYL CITRATE (PF) 250 MCG/5ML IJ SOLN
INTRAMUSCULAR | Status: DC | PRN
  Administered 2020-07-04 (×2): 50 ug via INTRAVENOUS

## 2020-07-04 MED ORDER — MIDAZOLAM HCL 2 MG/2ML IJ SOLN
INTRAMUSCULAR | Status: AC
  Filled 2020-07-04: qty 2

## 2020-07-04 MED ORDER — MIDAZOLAM HCL 5 MG/5ML IJ SOLN
INTRAMUSCULAR | Status: DC | PRN
  Administered 2020-07-04 (×2): 1 mg via INTRAVENOUS

## 2020-07-04 MED ORDER — SODIUM CHLORIDE 0.9 % IR SOLN
Status: DC | PRN
  Administered 2020-07-04: 3000 mL

## 2020-07-04 MED ORDER — PROPOFOL 10 MG/ML IV BOLUS
INTRAVENOUS | Status: DC | PRN
  Administered 2020-07-04: 150 mg via INTRAVENOUS

## 2020-07-04 SURGICAL SUPPLY — 20 items
BAG URO CATCHER STRL LF (MISCELLANEOUS) ×3 IMPLANT
BASKET ZERO TIP NITINOL 2.4FR (BASKET) IMPLANT
CATH INTERMIT  6FR 70CM (CATHETERS) IMPLANT
CLOTH BEACON ORANGE TIMEOUT ST (SAFETY) ×3 IMPLANT
FIBER LASER FLEXIVA 365 (UROLOGICAL SUPPLIES) IMPLANT
FIBER LASER TRAC TIP (UROLOGICAL SUPPLIES) ×3 IMPLANT
GLOVE BIOGEL M STRL SZ7.5 (GLOVE) ×3 IMPLANT
GOWN STRL REUS W/TWL LRG LVL3 (GOWN DISPOSABLE) ×6 IMPLANT
GUIDEWIRE ANG ZIPWIRE 038X150 (WIRE) IMPLANT
GUIDEWIRE STR DUAL SENSOR (WIRE) ×6 IMPLANT
IV NS 1000ML (IV SOLUTION) ×3
IV NS 1000ML BAXH (IV SOLUTION) ×1 IMPLANT
KIT TURNOVER KIT A (KITS) IMPLANT
MANIFOLD NEPTUNE II (INSTRUMENTS) ×3 IMPLANT
PACK CYSTO (CUSTOM PROCEDURE TRAY) ×3 IMPLANT
SHEATH URETERAL 12FRX35CM (MISCELLANEOUS) IMPLANT
STENT URET 6FRX24 CONTOUR (STENTS) ×3 IMPLANT
TUBING CONNECTING 10 (TUBING) ×2 IMPLANT
TUBING CONNECTING 10' (TUBING) ×1
TUBING UROLOGY SET (TUBING) ×3 IMPLANT

## 2020-07-04 NOTE — Progress Notes (Signed)
[facility] Progress Note  Patient: Vicki Moon    VOZ:[id number]  DOA: 07/02/2020     Date of Service: the patient was seen and examined on 07/04/2020  Brief hospital course: 69 y.o.femalewith medical history significant oftype 2 diabetes, multiple myeloma s/p prior radiation therapy,, glaucoma, herniated lumbar disc, erythropoietin deficiency anemia, anxiety admitted on 07/03/2020 with worsening abdominal pain with imaging finding of severe progression of multiple myeloma since May 2021 with aortic invasion and underlying diffuse skeletal myelomatous involvement and possible left-sided hydronephrosis due to tumor obstructing the distal left ureter, cannot rule out left ureteral calculus  Currently plan is pain control and follow-up on urology, medical oncology and radiation oncology consultation.  Assessment and Plan: 1)Multiple myeloma with abdominal pain significantly worse due to  severe progression of multiple myeloma since May 2021 patient previously completed radiation therapy Medical oncology consulted. We will follow up on recommendation. Also consulted radiation oncology for right groin pain that the patient appears to have a new mass. Likely will benefit from radiation there.  2) left-sided hydronephrosis- due to tumor obstructing the distal left ureter, cannot rule out left ureteral calculus Urology consulted. Appreciate assistance. Change IV fluids.  3) chronic anemia in the setting of underlying multiple myeloma--H&H appears to be at baseline  4)DM2-hold Farxiga, Use Novolog/Humalog Sliding scale insulin with Accu-Cheks/Fingersticks as ordered   5)AKI----acute kidney injury --due to dehydration, cannot exclude possible left-sided hydronephrosis with possible obstructive uropathy contributing -- creatinine on admission= 1.52 , baseline creatinine = 0.8 to 1.0    , creatinine is now= 1.40 ,  ---renally adjust medications, avoid nephrotoxic agents /  dehydration  / hypotension  6) elevated D-dimer---  in the setting of underlying multiple myeloma Lower extremity Doppler negative.  Diet: Cardiac diet DVT Prophylaxis:   heparin injection 5,000 Units Start: 07/04/20 1400 SCDs Start: 07/03/20 0248    Advance goals of care discussion: Full code  Family Communication: family was present at bedside, at the time of interview.  The pt provided permission to discuss medical plan with the family. Opportunity was given to ask question and all questions were answered satisfactorily.   Disposition:  Status is: Inpatient  Remains inpatient appropriate because:Ongoing diagnostic testing needed not appropriate for outpatient work up and IV treatments appropriate due to intensity of illness or inability to take PO   Dispo: The patient is from: Home              Anticipated d/c is to: SNF              Anticipated d/c date is: 2 days              Patient currently is not medically stable to d/c.  Subjective: Continues to report severe pain.  No nausea no vomiting.  No fever no chills.  Physical Exam:  General: Appear in mild distress, no Rash; Oral Mucosa Clear, moist. no Abnormal Neck Mass Or lumps, Conjunctiva normal  Cardiovascular: S1 and S2 Present, no Murmur, Respiratory: good respiratory effort, Bilateral Air entry present and Clear to Auscultation, no Crackles, no wheezes Abdomen: Bowel Sound present, Soft and diffuse tenderness Extremities: no Pedal edema, no calf tenderness Neurology: alert and oriented to time, place, and person affect appropriate. no new focal deficit Gait not checked due to patient safety concerns  Vitals:   07/04/20 0100 07/04/20 0529 07/04/20 0747 07/04/20 1248  BP: 114/62 (!) 128/49 (!) 153/58 (!) 141/60  Pulse: 82 83 74 79  Resp: 16 17 18  18  Temp: 97.6 F (36.4 C) 97.7 F (36.5 C) (!) 97.5 F (36.4 C) 98.1 F (36.7 C)  TempSrc: Oral Oral Oral Oral  SpO2: 95% 99% 97% 98%  Weight:      Height:         Intake/Output Summary (Last 24 hours) at 07/04/2020 1922 Last data filed at 07/04/2020 1747 Gross per 24 hour  Intake 2017.23 ml  Output 700 ml  Net 1317.23 ml   Filed Weights   07/02/20 1239 07/03/20 2038  Weight: 70 kg 69.1 kg    Data Reviewed: I have personally reviewed and interpreted daily labs, tele strips, imagings as discussed above. I reviewed all nursing notes, pharmacy notes, vitals, pertinent old records I have discussed plan of care as described above with RN and patient/family.  CBC: Recent Labs  Lab 07/02/20 1705 07/03/20 0406  WBC 3.7* 3.6*  NEUTROABS 2.8 2.7  HGB 11.6* 9.9*  HCT 36.1 33.0*  MCV 98.4 104.4*  PLT 248 102   Basic Metabolic Panel: Recent Labs  Lab 07/02/20 1705 07/02/20 2134 07/03/20 0406  NA 136 136 138  K 4.6 4.4 4.6  CL 104 109 110  CO2 21* 17* 16*  GLUCOSE 125* 138* 137*  BUN '21 21 21  '$ CREATININE 1.52* 1.46* 1.40*  CALCIUM 8.6* 7.6* 7.4*    Studies: VAS [place] LOWER EXTREMITY VENOUS (DVT)  Result Date: 07/04/2020  Lower Venous DVTStudy Indications: Edema, and concern for PE.  Comparison Study: Lt LEV 12-29-2019 Performing Technologist: Darlin Coco  Examination Guidelines: A complete evaluation includes B-mode imaging, spectral Doppler, color Doppler, and power Doppler as needed of all accessible portions of each vessel. Bilateral testing is considered an integral part of a complete examination. Limited examinations for reoccurring indications may be performed as noted. The reflux portion of the exam is performed with the patient in reverse Trendelenburg.  +---------+---------------+---------+-----------+----------+--------------+ RIGHT    CompressibilityPhasicitySpontaneityPropertiesThrombus Aging +---------+---------------+---------+-----------+----------+--------------+ CFV      Full           Yes      Yes                                 +---------+---------------+---------+-----------+----------+--------------+ SFJ       Full                                                        +---------+---------------+---------+-----------+----------+--------------+ FV Prox  Full                                                        +---------+---------------+---------+-----------+----------+--------------+ FV Mid   Full                                                        +---------+---------------+---------+-----------+----------+--------------+ FV DistalFull                                                        +---------+---------------+---------+-----------+----------+--------------+  PFV      Full                                                        +---------+---------------+---------+-----------+----------+--------------+ POP      Full           Yes      Yes                                 +---------+---------------+---------+-----------+----------+--------------+ PTV      Full                                                        +---------+---------------+---------+-----------+----------+--------------+ PERO     Full                                                        +---------+---------------+---------+-----------+----------+--------------+   +---------+---------------+---------+-----------+----------+--------------+ LEFT     CompressibilityPhasicitySpontaneityPropertiesThrombus Aging +---------+---------------+---------+-----------+----------+--------------+ CFV      Full           Yes      Yes                                 +---------+---------------+---------+-----------+----------+--------------+ SFJ      Full                                                        +---------+---------------+---------+-----------+----------+--------------+ FV Prox  Full                                                        +---------+---------------+---------+-----------+----------+--------------+ FV Mid   Full                                                         +---------+---------------+---------+-----------+----------+--------------+ FV DistalFull                                                        +---------+---------------+---------+-----------+----------+--------------+ PFV      Full                                                        +---------+---------------+---------+-----------+----------+--------------+  POP      Full           Yes      Yes                                 +---------+---------------+---------+-----------+----------+--------------+ PTV      Full                                                        +---------+---------------+---------+-----------+----------+--------------+ PERO     Full                                                        +---------+---------------+---------+-----------+----------+--------------+     Summary: RIGHT: - There is no evidence of deep vein thrombosis in the lower extremity.  - No cystic structure found in the popliteal fossa.  LEFT: - There is no evidence of deep vein thrombosis in the lower extremity.  - No cystic structure found in the popliteal fossa.  *See table(s) above for measurements and observations.    Preliminary     Scheduled Meds: . heparin injection (subcutaneous)  5,000 Units Subcutaneous Q8H  . insulin aspart  0-5 Units Subcutaneous TID WC  . insulin aspart  0-5 Units Subcutaneous QHS  . latanoprost  1 drop Both Eyes QHS   Continuous Infusions: .  ceFAZolin (ANCEF) IV Stopped (07/04/20 1758)  . lactated ringers 75 mL/hr at 07/04/20 1142   PRN Meds: acetaminophen **OR** acetaminophen, ALPRAZolam, cyclobenzaprine, HYDROcodone-acetaminophen, HYDROmorphone (DILAUDID) injection, labetalol, LORazepam, ondansetron **OR** ondansetron (ZOFRAN) IV  Time spent: 35 minutes  Author: Berle Mull, MD [facility] 07/04/2020 7:22 PM  To reach On-call, see care teams to locate the attending and reach out via  [email or web address]. Between 7PM-7AM, please contact night-coverage If you still have difficulty reaching the attending provider, please page the [facility] (Director on Call) for [facility] on amion for assistance.

## 2020-07-04 NOTE — Op Note (Signed)
Preoperative diagnosis: Left hydronephrosis with possible left ureteral calculus  Postoperative diagnosis: Left ureteral calculus  Procedure:  1. Cystoscopy 2. Left ureteroscopy and stone removal 3. Left ureteral stent placement (6 x 24 - no string)  4. Left retrograde pyelography with interpretation  Surgeon: Pryor Curia. M.D.  Anesthesia: General  Complications: None  Intraoperative findings: Left retrograde pyelography demonstrated a distinct transition point in the mid/distal ureter about 6 cm above the ureteral orifice without obvious filling defects with evidence of a dilated ureter and renal collecting system above this area of transition.  EBL: Minimal  Specimens: 1. Left ureteral calculus  Disposition of specimens: [facility] for stone analysis  Indication: Vicki Moon is a 69 y.o. year old patient with multiple myeloma.  She presented with acute left flank pain.  CT imaging revealed pelvic lymphadenopathy but also a possible ureteral stone at the area of potential extrinsic obstruction.  After reviewing the management options for treatment, the patient elected to proceed with the above surgical procedure(s). We have discussed the potential benefits and risks of the procedure, side effects of the proposed treatment, the likelihood of the patient achieving the goals of the procedure, and any potential problems that might occur during the procedure or recuperation. Informed consent has been obtained.  Description of procedure:  The patient was taken to the operating room and general anesthesia was induced.  The patient was placed in the dorsal lithotomy position, prepped and draped in the usual sterile fashion, and preoperative antibiotics were administered. A preoperative time-out was performed.   Cystourethroscopy was performed.  The patient's urethra was examined and was normal. The bladder was then systematically examined in its entirety.  There was no evidence for any bladder tumors, stones, or other mucosal pathology.    Attention then turned to the left ureteral orifice and a ureteral catheter was used to intubate the ureteral orifice.  Omnipaque contrast was injected through the ureteral catheter and a retrograde pyelogram was performed with findings as dictated above.  A 0.38 sensor guidewire was then advanced up the left ureter into the renal pelvis under fluoroscopic guidance. The 6 Fr semirigid ureteroscope was then advanced into the ureter next to the guidewire and two very small calculi were identified.  The ureter was tortuous and very dilated above the level of the transition which was where the stones were.  Considering the small size of the stones it was felt that her acute symptoms may have been related to her stones but her stones may have been caught up in this area due to extrinsic obstruction and narrowing of the ureter with obstruction related to malignancy.  All stones were removed from the ureter with a zero tip nitinol basket.  Reinspection of the ureter revealed no remaining visible stones or fragments.   The wire was then backloaded through the cystoscope and a ureteral stent was advance over the wire using Seldinger technique.  The stent was positioned appropriately under fluoroscopic and cystoscopic guidance.  The wire was then removed with an adequate stent curl noted in the renal pelvis as well as in the bladder.  The bladder was then emptied and the procedure ended.  The patient appeared to tolerate the procedure well and without complications.  The patient was able to be awakened and transferred to the recovery unit in satisfactory condition.

## 2020-07-04 NOTE — Progress Notes (Signed)
Lower extremity venous bilateral study completed.   See Cv Proc for preliminary results.   Vicki Moon  

## 2020-07-04 NOTE — Transfer of Care (Signed)
Immediate Anesthesia Transfer of Care Note  Patient: Vicki Moon  Procedure(s) Performed: CYSTOSCOPY/URETEROSCOP/STENT PLACEMENT left retrograde pylegram (Left Ureter)  Patient Location: PACU  Anesthesia Type:General  Level of Consciousness: awake, drowsy and responds to stimulation  Airway & Oxygen Therapy: Patient Spontanous Breathing and Patient connected to face mask oxygen  Post-op Assessment: Report given to RN and Post -op Vital signs reviewed and stable  Post vital signs: Reviewed and stable  Last Vitals:  Vitals Value Taken Time  BP 159/60 07/04/20 2100  Temp    Pulse 97 07/04/20 2101  Resp 17 07/04/20 2101  SpO2 100 % 07/04/20 2101  Vitals shown include unvalidated device data.  Last Pain:  Vitals:   07/04/20 1728  TempSrc:   PainSc: Asleep      Patients Stated Pain Goal: 3 (53/20/23 3435)  Complications: No complications documented.

## 2020-07-04 NOTE — Consult Note (Signed)
Urology Consult   Physician requesting consult: Dr. Posey Pronto  Reason for consult: Left hydronephrosis with flank pain  History of Present Illness: Vicki Moon is a 69 y.o. with a history of multiple myeloma diagnosed over the past year.  She states that she has been having some mild left-sided flank pain and abdominal pain over the last couple weeks but had severe worsening of her symptoms over the weekend through yesterday.  This caused her to present to the emergency department.  She has denied any fever.  She has no nausea or vomiting.  She denies a history of urolithiasis.  She underwent a CT scan that did demonstrate new onset left hydroureteronephrosis.  She does have significant pelvic lymphadenopathy.  There is a questionable calcification in the vicinity of the lymphadenopathy near the ureter that could potentially represent a ureteral calculus.  Currently, her pain is not well controlled and quickly returns after her IV pain medication has worn off.  Past Medical History:  Diagnosis Date  . Diabetes mellitus without complication (HCC)    Type 2 IDDM x 5 years  . 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 Glaucoma   . Goals of care, counseling/discussion 04/29/2019  . Herniated lumbar intervertebral disc 06/2014  . Multiple myeloma ([place]) 04/29/2019  . PONV (postoperative nausea and vomiting)     Past Surgical History:  Procedure Laterality Date  . CHOLECYSTECTOMY    . COLONOSCOPY N/A 06/28/2015   Procedure: COLONOSCOPY;  Surgeon: Rogene Houston, MD;  [facility];  Service: Endoscopy;  Laterality: N/A;  155  . EYE SURGERY    . IR BONE MARROW BIOPSY & ASPIRATION  03/16/2020  . IR BONE TUMOR(S)RF ABLATION  04/28/2019  . IR KYPHO LUMBAR INC FX REDUCE BONE BX UNI/BIL CANNULATION INC/IMAGING  04/28/2019  . IR RADIOLOGIST EVAL & MGMT  04/22/2019  . LUMBAR LAMINECTOMY Right 07/12/2014   Procedure: LUMBAR FIVE TO SACRAL ONE MICRODISCECTOMY;  Surgeon: Marybelle Killings, MD;  Location:  [place];  Service: Orthopedics;  Laterality: Right;  . TUBAL LIGATION    . VITRECTOMY 25 GAUGE WITH SCLERAL BUCKLE Left 07/29/2017   Procedure: RETINAL DETACHMENT REPAIR LEFT EYE WITH PARSPLANA VITRECTOMY, AIR FLUID EXCHANGE, ENDO LASTER, DRAINAGE OF SUBRETINAL FLUID,ENDOLASER PPV /25 GAUGE;  Surgeon: Jalene Mullet, MD;  Location: [place];  Service: Ophthalmology;  Laterality: Left;     Current Hospital Medications:  Home meds:  No current facility-administered medications on file prior to encounter.   Current Outpatient Medications on File Prior to Encounter  Medication Sig Dispense Refill  . ALPRAZolam (XANAX) 0.5 MG tablet Take 0.5 mg by mouth at bedtime as needed for sleep.     . cyclobenzaprine (FLEXERIL) 10 MG tablet Take 1 tablet (10 mg total) by mouth 3 (three) times daily as needed for muscle spasms. 60 tablet 1  . FARXIGA 10 MG TABS tablet Take 10 mg by mouth 2 (two) times a week.     . fentaNYL (DURAGESIC) 25 MCG/HR Place 1 patch onto the skin every other day. 15 patch 0  . HYDROcodone-acetaminophen (NORCO) 10-325 MG tablet Take 1 tablet by mouth every 6 (six) hours as needed.    . latanoprost (XALATAN) 0.005 % ophthalmic solution Place 1 drop into both eyes at bedtime.    Marland Kitchen NOVOLOG 100 UNIT/ML injection Inject 1.2-25 Units into the skin as directed. PATIENT USES INSULIN PUMP  Patient has a set basal rate of 1.2-1.5u per hour with meal time boluses of up to 25 units    .  ondansetron (ZOFRAN) 8 MG tablet Take 1 tablet (8 mg total) by mouth every 8 (eight) hours as needed for nausea or vomiting. 20 tablet 0  . PARoxetine (PAXIL) 30 MG tablet Take 30 mg by mouth every morning.  3  . temazepam (RESTORIL) 15 MG capsule Take 1 capsule (15 mg total) by mouth at bedtime as needed for sleep. 30 capsule 0  . oxycodone-ibuprofen (COMBUNOX) 5-400 MG tablet Take 1 tablet by mouth every 6 (six) hours as needed for pain. (Patient not taking: Reported on 07/02/2020) 60 tablet 0  . venetoclax 100 MG  TABS Take 200 mg by mouth daily. (Patient not taking: Reported on 07/02/2020) 60 tablet 4     Scheduled Meds: . heparin injection (subcutaneous)  5,000 Units Subcutaneous Q8H  . insulin aspart  0-5 Units Subcutaneous TID WC  . insulin aspart  0-5 Units Subcutaneous QHS  . latanoprost  1 drop Both Eyes QHS   Continuous Infusions: . lactated ringers 75 mL/hr at 07/04/20 1142   PRN Meds:.acetaminophen **OR** acetaminophen, ALPRAZolam, cyclobenzaprine, HYDROcodone-acetaminophen, HYDROmorphone (DILAUDID) injection, labetalol, LORazepam, ondansetron **OR** ondansetron (ZOFRAN) IV  Allergies:  Allergies  Allergen Reactions  . Metformin Diarrhea and Nausea Only    Family History  Problem Relation Age of Onset  . Hypertension Mother   . Stroke Mother   . Cancer Father        brain tumor  . Diabetes Father     Social History:  reports that she has been smoking cigarettes. She has a 10.00 pack-year smoking history. She has never used smokeless tobacco. She reports that she does not drink alcohol and does not use drugs.  ROS: A complete review of systems was performed.  All systems are negative except for pertinent findings as noted.  Physical Exam:  Vital signs in last 24 hours: Temp:  [97.5 F (36.4 C)-98.1 F (36.7 C)] 98.1 F (36.7 C) (07/14 1248) Pulse Rate:  [74-83] 79 (07/14 1248) Resp:  [16-18] 18 (07/14 1248) BP: (114-153)/(49-65) 141/60 (07/14 1248) SpO2:  [94 %-99 %] 98 % (07/14 1248) Weight:  [69.1 kg] 69.1 kg (07/13 2038) Constitutional:  Alert and oriented, No acute distress Cardiovascular: Regular rate and rhythm, No JVD Respiratory: Normal respiratory effort, Lungs clear bilaterally GI: Abdomen is soft, nontender, nondistended, no abdominal masses GU: No CVA tenderness Lymphatic: No lymphadenopathy Neurologic: Grossly intact, no focal deficits Psychiatric: Normal mood and affect  Laboratory Data:  Recent Labs    07/02/20 1705 07/03/20 0406  WBC 3.7* 3.6*   HGB 11.6* 9.9*  HCT 36.1 33.0*  PLT 248 203    Recent Labs    07/02/20 1705 07/02/20 2134 07/03/20 0406  NA 136 136 138  K 4.6 4.4 4.6  CL 104 109 110  GLUCOSE 125* 138* 137*  BUN '21 21 21  '$ CALCIUM 8.6* 7.6* 7.4*  CREATININE 1.52* 1.46* 1.40*     Results for orders placed or performed during the hospital encounter of 07/02/20 (from the past 24 hour(s))  POC CBG, ED     Status: Abnormal   Collection Time: 07/03/20  5:42 PM  Result Value Ref Range   Glucose-Capillary 130 (H) 70 - 99 mg/dL  Glucose, capillary     Status: Abnormal   Collection Time: 07/03/20 11:01 PM  Result Value Ref Range   Glucose-Capillary 111 (H) 70 - 99 mg/dL   Comment 1 Notify RN    Comment 2 Document in Chart   Glucose, capillary     Status: Abnormal  Collection Time: 07/04/20  7:48 AM  Result Value Ref Range   Glucose-Capillary 102 (H) 70 - 99 mg/dL   Comment 1 Notify RN    Comment 2 Document in Chart   Glucose, capillary     Status: Abnormal   Collection Time: 07/04/20 11:45 AM  Result Value Ref Range   Glucose-Capillary 146 (H) 70 - 99 mg/dL   Comment 1 Notify RN    Comment 2 Document in Chart    Recent Results (from the past 240 hour(s))  SARS Coronavirus 2 by RT PCR (hospital order, performed in [facility] hospital lab) Nasopharyngeal Nasopharyngeal Swab     Status: None   Collection Time: 07/03/20 12:51 AM   Specimen: Nasopharyngeal Swab  Result Value Ref Range Status   SARS Coronavirus 2 NEGATIVE NEGATIVE Final    Comment: (NOTE) SARS-CoV-2 target nucleic acids are NOT DETECTED.  The SARS-CoV-2 RNA is generally detectable in upper and lower respiratory specimens during the acute phase of infection. The lowest concentration of SARS-CoV-2 viral copies this assay can detect is 250 copies / mL. A negative result does not preclude SARS-CoV-2 infection and should not be used as the sole basis for treatment or other patient management decisions.  A negative result may occur  with improper specimen collection / handling, submission of specimen other than nasopharyngeal swab, presence of viral mutation(s) within the areas targeted by this assay, and inadequate number of viral copies (<250 copies / mL). A negative result must be combined with clinical observations, patient history, and epidemiological information.  Fact Sheet for Patients:   StrictlyIdeas.no  Fact Sheet for Healthcare Providers: BankingDealers.co.za  This test is not yet approved or  cleared by the [place] FDA and has been authorized for detection and/or diagnosis of SARS-CoV-2 by FDA under an Emergency Use Authorization (EUA).  This EUA will remain in effect (meaning this test can be used) for the duration of the COVID-19 declaration under Section 564(b)(1) of the Act, 21 U.S.C. section 360bbb-3(b)(1), unless the authorization is terminated or revoked sooner.  Performed at [facility], [address], [place], [place] [address]     Renal Function: Recent Labs    07/02/20 1705 07/02/20 2134 07/03/20 0406  CREATININE 1.52* 1.46* 1.40*   Estimated Creatinine Clearance: 33.6 mL/min (A) (by C-G formula based on SCr of 1.4 mg/dL (H)).  Radiologic Imaging: CT Abdomen Pelvis Wo Contrast  Addendum Date: 07/03/2020   ADDENDUM REPORT: 07/03/2020 01:09 ADDENDUM: Study discussed by telephone with Dr. Dina Rich in the ED on 07/03/2020 at 0103 hours. Electronically Signed   By: Genevie Ann M.D.   On: 07/03/2020 01:09   Result Date: 07/03/2020 CLINICAL DATA:  69 year old female with chest pain. Increased chest and left lower abdominal pain for 3 days. Multiple myeloma. Ultrasound-guided lymph node biopsy last month positive for metastatic myeloma. EXAM: CT CHEST, ABDOMEN AND PELVIS WITHOUT CONTRAST TECHNIQUE: Multidetector CT imaging of the chest, abdomen and pelvis was performed following the standard protocol without IV contrast. COMPARISON:  Chest CT  05/08/2020 and earlier. CT Abdomen and Pelvis 06/07/2019. FINDINGS: CT CHEST FINDINGS Cardiovascular: Calcified aortic atherosclerosis. Vascular patency is not evaluated in the absence of IV contrast. No cardiomegaly or pericardial effusion. Mediastinum/Nodes: Large posterior mediastinal soft tissue mass which engulfs the descending thoracic aorta (series 2, image 40) and is contiguous with mass continuing along the left posterior lung and pleural margin. This now encompasses about 9 cm long axis and appears progressed from a much smaller 4 cm left paraspinal  soft tissue mass on 05/08/2020. this abnormal soft tissue also continues inferiorly and is contiguous with bulky retroperitoneal/prevertebral tumor in the upper abdomen which is up to 4 cm in thickness (2 cm in May). No superimposed superior or middle mediastinal lymphadenopathy. No definite hilar lymphadenopathy. Lungs/Pleura: Posterior left lung/pleural tumor contiguous with the posterior mediastinum. Superimposed mild atelectasis. Major airways remain patent. There is also a small but increased rib or pleural based tumor in the anterior left lung on series 3, image 59 measuring about 7 mm (punctate in May). No superimposed hematogenous lung metastasis identified. Musculoskeletal: Diffusely abnormal bone mineralization. Widespread subtle lytic lesions in the ribs, especially the left ribs 3 through 7. Stable mild pathologic compression fracture of the T2 body. No lower thoracic vertebral destruction despite the bulky prevertebral/paraspinal tumor described above. CT ABDOMEN PELVIS FINDINGS Hepatobiliary: Chronically absent gallbladder. Negative noncontrast liver. Pancreas: Negative. Spleen: Negative, although partially inseparable from large left paraspinal and retrocrural tumor. Adrenals/Urinary Tract: Adrenal glands remain normal. Negative noncontrast right kidney and right ureter. Left hydronephrosis and hydroureter, moderate to severe, secondary to likely  tumor obstruction of the distal ureter in the pelvis (series 2, image 96) where a bulky 7 cm left presacral soft tissue mass is inseparable from the distal left ureter and the left vaginal fornix. But furthermore, there may also be a superimposed 3 mm distal left ureteral calculus on series 2, image 98, uncertain. No other nephrolithiasis is identified. Both the left presacral tumor and right lower pelvic intramuscular tumor do not appear to directly involve the urinary bladder which is mildly distended. Stomach/Bowel: Extensive diverticulosis of the large bowel, especially the descending and sigmoid colon but no active inflammation. Normal appendix. No dilated small bowel. No free air or free fluid. Vascular/Lymphatic: Thoracoabdominal aorta is engulfed by tumor from the descending segment through the renal artery level. Vascular patency is not evaluated in the absence of IV contrast. Superimposed Calcified aortic atherosclerosis. Relatively mild superimposed discrete retroperitoneal lymphadenopathy. Reproductive: Left pelvic tumor partially inseparable from the vaginal fornix. Otherwise negative noncontrast uterus and adnexa. Other: No pelvic free fluid. Musculoskeletal: Widespread abnormal bone mineralization, new since 2020. Previous augmentation of L2. No new lumbar compression fracture. No upper lumbar vertebral erosion despite bulky prevertebral/retroperitoneal soft tissue tumor. Infiltrated sacral ala, which may be the source of a large left-side presacral pelvic tumor described in conjunction with the left ureter above. There is also bulky but indistinct intramuscular tumor along the medial right hip (series 2, image 118). And a small 2 cm focus of extraosseous tumor along the left posterior iliac wing on series 2, image 85. IMPRESSION: 1. Severe progression of multiple myeloma since May, most notable for: - bulky and contiguous posterior mediastinal, left posterior pleural, left retrocrural, and  retroperitoneal tumor engulfs the Aorta from the descending segment through the level of the renal arteries. Aortic Invasion and/or patency not evaluated in the absence of IV contrast. - bulky left pelvic/presacral tumor which is probably obstructing the distal left ureter, although there might possibly be a superimposed 3 mm distal left ureteral calculus also (series 2, image 98). 2. Additional bulky intramuscular tumor at the medial right hip musculature. Underlying diffuse skeletal myelomatous involvement. Electronically Signed: By: Genevie Ann M.D. On: 07/03/2020 00:53   DG Chest 2 View  Result Date: 07/02/2020 CLINICAL DATA:  Chest pain. EXAM: CHEST - 2 VIEW COMPARISON:  Chest CT 05/08/2020 FINDINGS: The cardiomediastinal contours are normal. The lungs are clear. Pulmonary vasculature is normal. No consolidation, pleural effusion, or  pneumothorax. Right scapular lesion on CT is not well seen by radiograph. Callus about left rib fractures, lytic lesions seen on prior chest CT. Callus about few anterior right ribs. Endplate irregularity superior aspect of tentatively L2, chronic based on 06/07/2019 abdominal CT. IMPRESSION: 1. No acute radiographic findings. 2. Majority of the bone lesions on prior CT are not well seen by radiograph. Callus about left greater than right rib fractures, lytic lesions seen on prior chest CT. Electronically Signed   By: Keith Rake M.D.   On: 07/02/2020 17:37   CT Chest Wo Contrast  Addendum Date: 07/03/2020   ADDENDUM REPORT: 07/03/2020 01:09 ADDENDUM: Study discussed by telephone with Dr. Dina Rich in the ED on 07/03/2020 at 0103 hours. Electronically Signed   By: Genevie Ann M.D.   On: 07/03/2020 01:09   Result Date: 07/03/2020 CLINICAL DATA:  69 year old female with chest pain. Increased chest and left lower abdominal pain for 3 days. Multiple myeloma. Ultrasound-guided lymph node biopsy last month positive for metastatic myeloma. EXAM: CT CHEST, ABDOMEN AND PELVIS WITHOUT  CONTRAST TECHNIQUE: Multidetector CT imaging of the chest, abdomen and pelvis was performed following the standard protocol without IV contrast. COMPARISON:  Chest CT 05/08/2020 and earlier. CT Abdomen and Pelvis 06/07/2019. FINDINGS: CT CHEST FINDINGS Cardiovascular: Calcified aortic atherosclerosis. Vascular patency is not evaluated in the absence of IV contrast. No cardiomegaly or pericardial effusion. Mediastinum/Nodes: Large posterior mediastinal soft tissue mass which engulfs the descending thoracic aorta (series 2, image 40) and is contiguous with mass continuing along the left posterior lung and pleural margin. This now encompasses about 9 cm long axis and appears progressed from a much smaller 4 cm left paraspinal soft tissue mass on 05/08/2020. this abnormal soft tissue also continues inferiorly and is contiguous with bulky retroperitoneal/prevertebral tumor in the upper abdomen which is up to 4 cm in thickness (2 cm in May). No superimposed superior or middle mediastinal lymphadenopathy. No definite hilar lymphadenopathy. Lungs/Pleura: Posterior left lung/pleural tumor contiguous with the posterior mediastinum. Superimposed mild atelectasis. Major airways remain patent. There is also a small but increased rib or pleural based tumor in the anterior left lung on series 3, image 59 measuring about 7 mm (punctate in May). No superimposed hematogenous lung metastasis identified. Musculoskeletal: Diffusely abnormal bone mineralization. Widespread subtle lytic lesions in the ribs, especially the left ribs 3 through 7. Stable mild pathologic compression fracture of the T2 body. No lower thoracic vertebral destruction despite the bulky prevertebral/paraspinal tumor described above. CT ABDOMEN PELVIS FINDINGS Hepatobiliary: Chronically absent gallbladder. Negative noncontrast liver. Pancreas: Negative. Spleen: Negative, although partially inseparable from large left paraspinal and retrocrural tumor.  Adrenals/Urinary Tract: Adrenal glands remain normal. Negative noncontrast right kidney and right ureter. Left hydronephrosis and hydroureter, moderate to severe, secondary to likely tumor obstruction of the distal ureter in the pelvis (series 2, image 96) where a bulky 7 cm left presacral soft tissue mass is inseparable from the distal left ureter and the left vaginal fornix. But furthermore, there may also be a superimposed 3 mm distal left ureteral calculus on series 2, image 98, uncertain. No other nephrolithiasis is identified. Both the left presacral tumor and right lower pelvic intramuscular tumor do not appear to directly involve the urinary bladder which is mildly distended. Stomach/Bowel: Extensive diverticulosis of the large bowel, especially the descending and sigmoid colon but no active inflammation. Normal appendix. No dilated small bowel. No free air or free fluid. Vascular/Lymphatic: Thoracoabdominal aorta is engulfed by tumor from the descending segment through  the renal artery level. Vascular patency is not evaluated in the absence of IV contrast. Superimposed Calcified aortic atherosclerosis. Relatively mild superimposed discrete retroperitoneal lymphadenopathy. Reproductive: Left pelvic tumor partially inseparable from the vaginal fornix. Otherwise negative noncontrast uterus and adnexa. Other: No pelvic free fluid. Musculoskeletal: Widespread abnormal bone mineralization, new since 2020. Previous augmentation of L2. No new lumbar compression fracture. No upper lumbar vertebral erosion despite bulky prevertebral/retroperitoneal soft tissue tumor. Infiltrated sacral ala, which may be the source of a large left-side presacral pelvic tumor described in conjunction with the left ureter above. There is also bulky but indistinct intramuscular tumor along the medial right hip (series 2, image 118). And a small 2 cm focus of extraosseous tumor along the left posterior iliac wing on series 2, image 85.  IMPRESSION: 1. Severe progression of multiple myeloma since May, most notable for: - bulky and contiguous posterior mediastinal, left posterior pleural, left retrocrural, and retroperitoneal tumor engulfs the Aorta from the descending segment through the level of the renal arteries. Aortic Invasion and/or patency not evaluated in the absence of IV contrast. - bulky left pelvic/presacral tumor which is probably obstructing the distal left ureter, although there might possibly be a superimposed 3 mm distal left ureteral calculus also (series 2, image 98). 2. Additional bulky intramuscular tumor at the medial right hip musculature. Underlying diffuse skeletal myelomatous involvement. Electronically Signed: By: Genevie Ann M.D. On: 07/03/2020 00:53   VAS [place] LOWER EXTREMITY VENOUS (DVT)  Result Date: 07/04/2020  Lower Venous DVTStudy Indications: Edema, and concern for PE.  Comparison Study: Lt LEV 12-29-2019 Performing Technologist: Darlin Coco  Examination Guidelines: A complete evaluation includes B-mode imaging, spectral Doppler, color Doppler, and power Doppler as needed of all accessible portions of each vessel. Bilateral testing is considered an integral part of a complete examination. Limited examinations for reoccurring indications may be performed as noted. The reflux portion of the exam is performed with the patient in reverse Trendelenburg.  +---------+---------------+---------+-----------+----------+--------------+ RIGHT    CompressibilityPhasicitySpontaneityPropertiesThrombus Aging +---------+---------------+---------+-----------+----------+--------------+ CFV      Full           Yes      Yes                                 +---------+---------------+---------+-----------+----------+--------------+ SFJ      Full                                                        +---------+---------------+---------+-----------+----------+--------------+ FV Prox  Full                                                         +---------+---------------+---------+-----------+----------+--------------+ FV Mid   Full                                                        +---------+---------------+---------+-----------+----------+--------------+ FV DistalFull                                                        +---------+---------------+---------+-----------+----------+--------------+  PFV      Full                                                        +---------+---------------+---------+-----------+----------+--------------+ POP      Full           Yes      Yes                                 +---------+---------------+---------+-----------+----------+--------------+ PTV      Full                                                        +---------+---------------+---------+-----------+----------+--------------+ PERO     Full                                                        +---------+---------------+---------+-----------+----------+--------------+   +---------+---------------+---------+-----------+----------+--------------+ LEFT     CompressibilityPhasicitySpontaneityPropertiesThrombus Aging +---------+---------------+---------+-----------+----------+--------------+ CFV      Full           Yes      Yes                                 +---------+---------------+---------+-----------+----------+--------------+ SFJ      Full                                                        +---------+---------------+---------+-----------+----------+--------------+ FV Prox  Full                                                        +---------+---------------+---------+-----------+----------+--------------+ FV Mid   Full                                                        +---------+---------------+---------+-----------+----------+--------------+ FV DistalFull                                                         +---------+---------------+---------+-----------+----------+--------------+ PFV      Full                                                        +---------+---------------+---------+-----------+----------+--------------+   POP      Full           Yes      Yes                                 +---------+---------------+---------+-----------+----------+--------------+ PTV      Full                                                        +---------+---------------+---------+-----------+----------+--------------+ PERO     Full                                                        +---------+---------------+---------+-----------+----------+--------------+     Summary: RIGHT: - There is no evidence of deep vein thrombosis in the lower extremity.  - No cystic structure found in the popliteal fossa.  LEFT: - There is no evidence of deep vein thrombosis in the lower extremity.  - No cystic structure found in the popliteal fossa.  *See table(s) above for measurements and observations.    Preliminary     I independently reviewed the above imaging studies.  Impression/Recommendation: Left hydronephrosis with questionable left ureteral stone: Her left hydronephrosis is potentially related to her progressive malignancy.  However, the acute onset of her symptoms would suggest a more rapid acute etiology.  Considering the onset of her symptoms along with her CT scan suggesting the possibility of a small ureteral calculus, this would be a reasonable explanation for her symptoms and her imaging findings.  She did not have evidence of hydronephrosis on her CT scan last summer.  I have recommended that she proceed with cystoscopy, left retrograde pyelography, left ureteroscopy with stone removal if present, and left ureteral stent placement.  She gives informed consent.  She has been n.p.o. since 2 PM this afternoon and after discussing with the operating room, it appears that it will be better to proceed  with surgery this evening rather than tomorrow.  Dutch Gray 07/04/2020, 4:56 PM  Pryor Curia. MD   CC: Dr. Posey Pronto

## 2020-07-04 NOTE — Anesthesia Preprocedure Evaluation (Addendum)
Anesthesia Evaluation  Patient identified by MRN, date of birth, ID band Patient awake    Reviewed: Allergy & Precautions, NPO status , Patient's Chart, lab work & pertinent test results  History of Anesthesia Complications (+) PONV and history of anesthetic complications  Airway Mallampati: II  TM Distance: >3 FB Neck ROM: Full    Dental no notable dental hx.    Pulmonary Current Smoker and Patient abstained from smoking.,    Pulmonary exam normal breath sounds clear to auscultation       Cardiovascular negative cardio ROS Normal cardiovascular exam Rhythm:Regular Rate:Normal     Neuro/Psych PSYCHIATRIC DISORDERS Depression negative neurological ROS     GI/Hepatic negative GI ROS, Neg liver ROS,   Endo/Other  diabetes, Insulin Dependent  Renal/GU negative Renal ROS     Musculoskeletal negative musculoskeletal ROS (+)   Abdominal   Peds  Hematology  (+) anemia , Multiple myeloma   Anesthesia Other Findings Left ureteral stone  Reproductive/Obstetrics                            Anesthesia Physical Anesthesia Plan  ASA: III  Anesthesia Plan: General   Post-op Pain Management:    Induction: Intravenous  PONV Risk Score and Plan: 4 or greater and Ondansetron, Dexamethasone, Propofol infusion and Treatment may vary due to age or medical condition  Airway Management Planned: Oral ETT  Additional Equipment:   Intra-op Plan:   Post-operative Plan: Extubation in OR  Informed Consent: I have reviewed the patients History and Physical, chart, labs and discussed the procedure including the risks, benefits and alternatives for the proposed anesthesia with the patient or authorized representative who has indicated his/her understanding and acceptance.     Dental advisory given  Plan Discussed with: CRNA  Anesthesia Plan Comments:        Anesthesia Quick Evaluation

## 2020-07-04 NOTE — Plan of Care (Signed)
  Problem: Clinical Measurements: °Goal: Will remain free from infection °Outcome: Progressing °  °Problem: Nutrition: °Goal: Adequate nutrition will be maintained °Outcome: Not Progressing °  °

## 2020-07-04 NOTE — Anesthesia Procedure Notes (Signed)
Procedure Name: Intubation Date/Time: 07/04/2020 8:28 PM Performed by: Lollie Sails, CRNA Pre-anesthesia Checklist: Patient identified, Emergency Drugs available, Suction available, Patient being monitored and Timeout performed Patient Re-evaluated:Patient Re-evaluated prior to induction Oxygen Delivery Method: Circle system utilized Preoxygenation: Pre-oxygenation with 100% oxygen Induction Type: IV induction Laryngoscope Size: Miller and 3 Grade View: Grade II Tube type: Oral Tube size: 7.0 mm Number of attempts: 1 Airway Equipment and Method: Stylet Placement Confirmation: ETT inserted through vocal cords under direct vision,  positive ETCO2 and breath sounds checked- equal and bilateral Secured at: 22 cm Tube secured with: Tape Dental Injury: Teeth and Oropharynx as per pre-operative assessment

## 2020-07-05 ENCOUNTER — Ambulatory Visit
Admit: 2020-07-05 | Discharge: 2020-07-05 | Disposition: A | Discharge status: Home / Self Care | Payer: Medicare Other | Attending: Radiation Oncology | Admitting: Radiation Oncology

## 2020-07-05 ENCOUNTER — Encounter (HOSPITAL_COMMUNITY): Payer: Self-pay | Admitting: Urology

## 2020-07-05 DIAGNOSIS — N179 Acute kidney failure, unspecified: Secondary | ICD-10-CM

## 2020-07-05 DIAGNOSIS — C9000 Multiple myeloma not having achieved remission: Secondary | ICD-10-CM

## 2020-07-05 DIAGNOSIS — R1084 Generalized abdominal pain: Secondary | ICD-10-CM

## 2020-07-05 DIAGNOSIS — R109 Unspecified abdominal pain: Secondary | ICD-10-CM

## 2020-07-05 DIAGNOSIS — I10 Essential (primary) hypertension: Secondary | ICD-10-CM

## 2020-07-05 DIAGNOSIS — E11628 Type 2 diabetes mellitus with other skin complications: Secondary | ICD-10-CM

## 2020-07-05 DIAGNOSIS — K5900 Constipation, unspecified: Secondary | ICD-10-CM

## 2020-07-05 DIAGNOSIS — R7989 Other specified abnormal findings of blood chemistry: Secondary | ICD-10-CM

## 2020-07-05 DIAGNOSIS — N139 Obstructive and reflux uropathy, unspecified: Secondary | ICD-10-CM

## 2020-07-05 DIAGNOSIS — R0789 Other chest pain: Secondary | ICD-10-CM

## 2020-07-05 DIAGNOSIS — D499 Neoplasm of unspecified behavior of unspecified site: Secondary | ICD-10-CM

## 2020-07-05 DIAGNOSIS — Z66 Do not resuscitate: Secondary | ICD-10-CM

## 2020-07-05 LAB — CBC WITH DIFFERENTIAL/PLATELET
Abs Immature Granulocytes: 0.04 10*3/uL (ref 0.00–0.07)
Basophils Absolute: 0 10*3/uL (ref 0.0–0.1)
Basophils Relative: 0 %
Eosinophils Absolute: 0 10*3/uL (ref 0.0–0.5)
Eosinophils Relative: 0 %
HCT: 35.1 % — ABNORMAL LOW (ref 36.0–46.0)
Hemoglobin: 11 g/dL — ABNORMAL LOW (ref 12.0–15.0)
Immature Granulocytes: 1 %
Lymphocytes Relative: 3 %
Lymphs Abs: 0.1 10*3/uL — ABNORMAL LOW (ref 0.7–4.0)
MCH: 31.3 pg (ref 26.0–34.0)
MCHC: 31.3 g/dL (ref 30.0–36.0)
MCV: 100 fL (ref 80.0–100.0)
Monocytes Absolute: 0.2 10*3/uL (ref 0.1–1.0)
Monocytes Relative: 6 %
Neutro Abs: 2.8 10*3/uL (ref 1.7–7.7)
Neutrophils Relative %: 90 %
Platelets: 184 10*3/uL (ref 150–400)
RBC: 3.51 MIL/uL — ABNORMAL LOW (ref 3.87–5.11)
RDW: 14.6 % (ref 11.5–15.5)
WBC: 3.2 10*3/uL — ABNORMAL LOW (ref 4.0–10.5)
nRBC: 0 % (ref 0.0–0.2)

## 2020-07-05 LAB — GLUCOSE, CAPILLARY
Glucose-Capillary: 161 mg/dL — ABNORMAL HIGH (ref 70–99)
Glucose-Capillary: 159 mg/dL — ABNORMAL HIGH (ref 70–99)
Glucose-Capillary: 186 mg/dL — ABNORMAL HIGH (ref 70–99)
Glucose-Capillary: 172 mg/dL — ABNORMAL HIGH (ref 70–99)

## 2020-07-05 LAB — RENAL FUNCTION PANEL
Albumin: 2.8 g/dL — ABNORMAL LOW (ref 3.5–5.0)
Anion gap: 10 (ref 5–15)
BUN: 17 mg/dL (ref 8–23)
CO2: 21 mmol/L — ABNORMAL LOW (ref 22–32)
Calcium: 7.6 mg/dL — ABNORMAL LOW (ref 8.9–10.3)
Chloride: 106 mmol/L (ref 98–111)
Creatinine, Ser: 1.01 mg/dL — ABNORMAL HIGH (ref 0.44–1.00)
GFR calc Af Amer: >60 mL/min (ref >60)
GFR calc non Af Amer: 56 mL/min — ABNORMAL LOW (ref >60)
Glucose, Bld: 191 mg/dL — ABNORMAL HIGH (ref 70–99)
Phosphorus: 2.5 mg/dL (ref 2.5–4.6)
Potassium: 5.1 mmol/L (ref 3.5–5.1)
Sodium: 137 mmol/L (ref 135–145)

## 2020-07-05 IMAGING — XA IR BIOPSY AND ASPIRATION BONE MARROW
1 series · 2 of 2 positions shown · non-contrast
Comparison: none

INDICATION: Pancytopenia

[Series 300: ir bone marrow biopsy & aspiration · 2 of 2 slices shown]
[im 1/2]
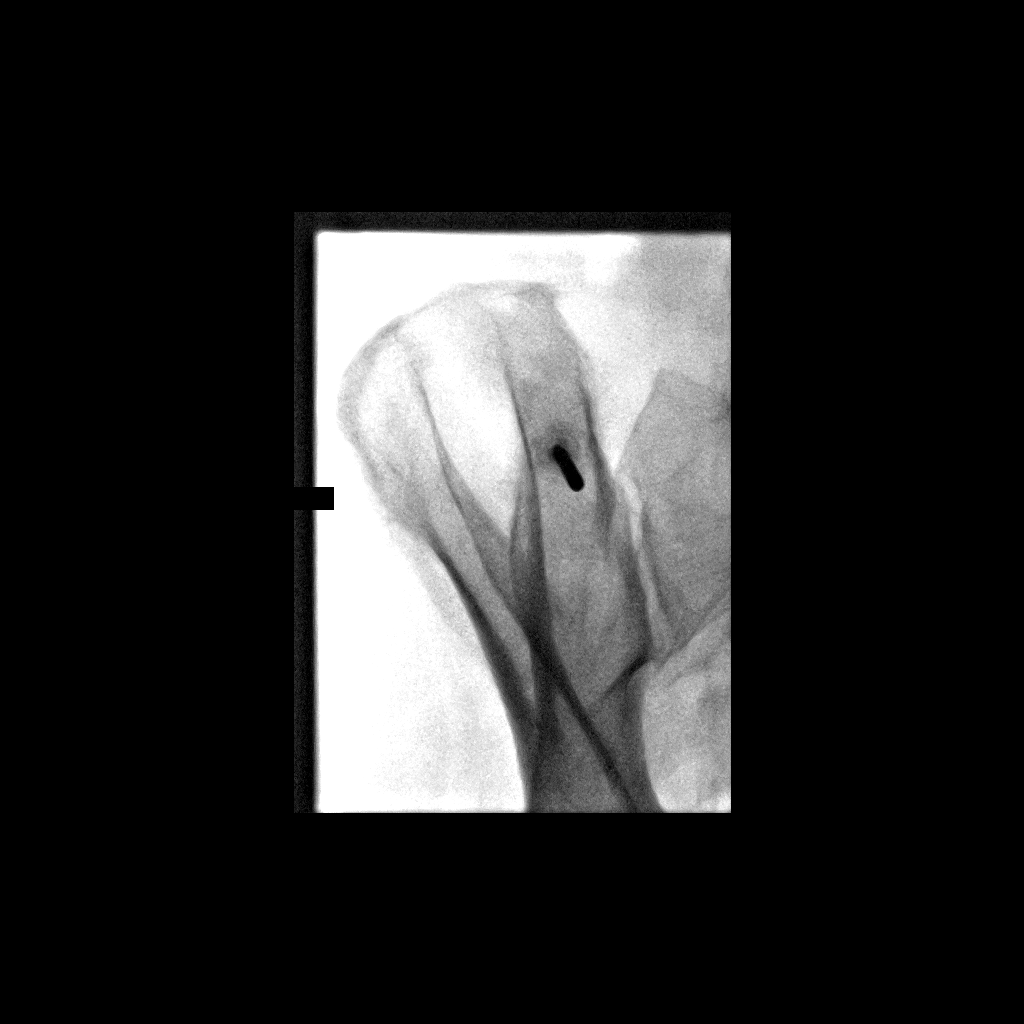
[im 2/2]
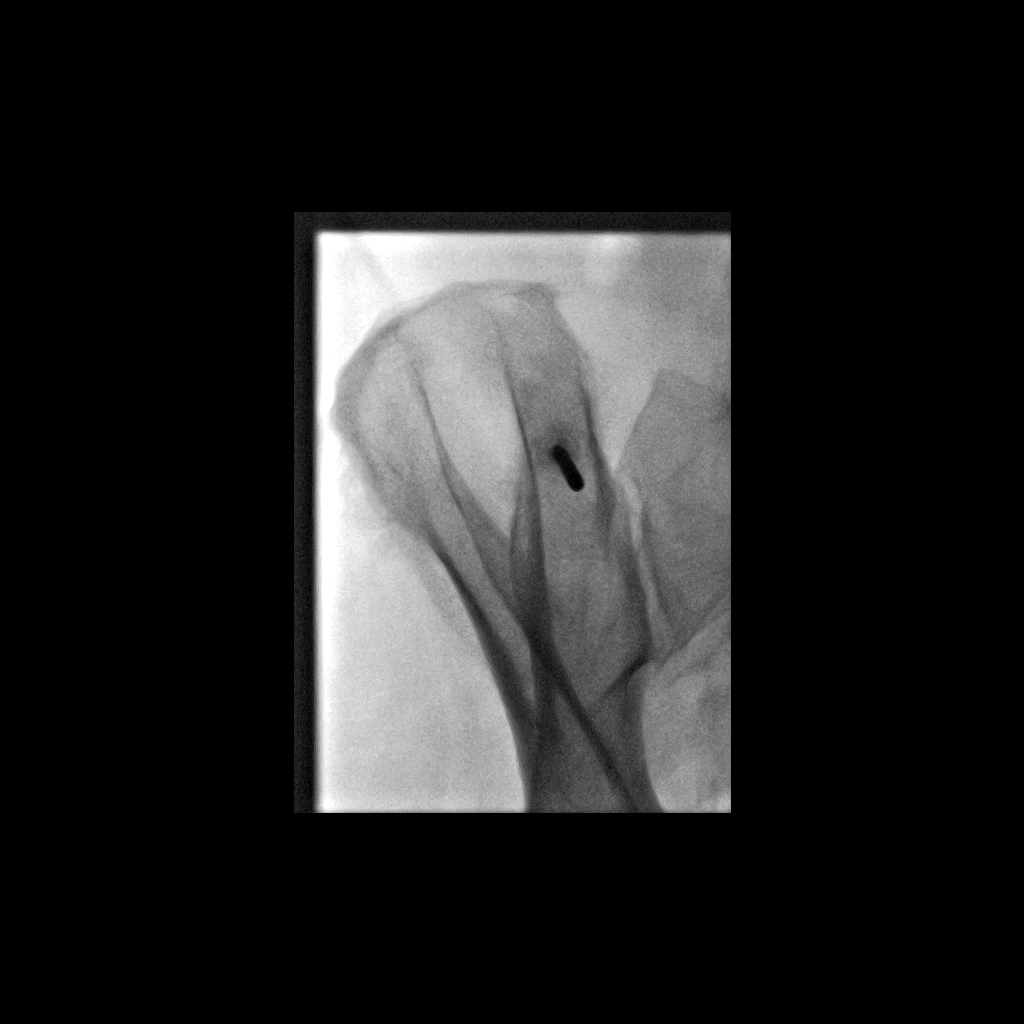

[2 of 2 positions shown; findings below may reference images not displayed]

EXAM:
FLUORO GUIDED BONE MARROW ASPIRATION AND CORE BIOPSY

MEDICATIONS:
None.

ANESTHESIA/SEDATION:
Moderate (conscious) sedation was employed during this procedure. A
total of 4 milligrams versed and 100 micrograms fentanyl were
administered intravenously. The patient's level of consciousness and
vital signs were monitored continuously by radiology nursing
throughout the procedure under my direct supervision.

Total monitored sedation time: 13 minutes

FLUOROSCOPY TIME:  Fluoroscopy Time: 0 minutes 36 seconds (9 mGy).

COMPLICATIONS:
None immediate.

Estimated blood loss: <25 mL

PROCEDURE:
Informed written consent was obtained from the patient after a
thorough discussion of the procedural risks, benefits and
alternatives. All questions were addressed. Maximal Sterile Barrier
Technique was utilized including caps, mask, sterile gowns, sterile
gloves, sterile drape, hand hygiene and skin antiseptic. A timeout
was performed prior to the initiation of the procedure.

The patient was positioned prone and localization with fluoroscopy
was performed of the pelvis to demonstrate the left iliac marrow
space.

Maximal barrier sterile technique utilized including caps, mask,
sterile gowns, sterile gloves, large sterile drape, hand hygiene,
and betadine prep.

Under sterile conditions and local anesthesia, an 11 gauge coaxial
bone biopsy needle was advanced into the left iliac marrow space.
Needle position was confirmed with fluoroscopy. Initially, bone
marrow aspiration was performed. Next, the 11 gauge outer cannula
was utilized to obtain a left iliac bone marrow core biopsy
utilizing the on control drill. Needle was removed. Hemostasis was
obtained with compression. The patient tolerated the procedure well.
Samples were prepared with the cytotechnologist.
IMPRESSION: Technically successful image guided bone marrow aspiration and core
biopsy of the left iliac bone.

## 2020-07-05 MED ORDER — LACTULOSE 10 GM/15ML PO SOLN
20.0000 g | Freq: Every day | ORAL | Status: DC
  Administered 2020-07-05 – 2020-07-06 (×2): 20 g via ORAL
  Filled 2020-07-05 (×2): qty 30

## 2020-07-05 MED ORDER — OXYCODONE-ACETAMINOPHEN 5-325 MG PO TABS
1.0000 | ORAL_TABLET | ORAL | Status: DC | PRN
  Administered 2020-07-05 – 2020-07-09 (×15): 1 via ORAL
  Filled 2020-07-05 (×16): qty 1

## 2020-07-05 MED ORDER — CYCLOBENZAPRINE HCL 10 MG PO TABS
10.0000 mg | ORAL_TABLET | Freq: Three times a day (TID) | ORAL | Status: DC
  Administered 2020-07-05 – 2020-07-10 (×18): 10 mg via ORAL
  Filled 2020-07-05 (×17): qty 1

## 2020-07-05 MED ORDER — SENNOSIDES-DOCUSATE SODIUM 8.6-50 MG PO TABS
1.0000 | ORAL_TABLET | Freq: Two times a day (BID) | ORAL | Status: DC
  Administered 2020-07-05 – 2020-07-12 (×8): 1 via ORAL
  Filled 2020-07-05 (×17): qty 1

## 2020-07-05 MED ORDER — CYCLOBENZAPRINE HCL 10 MG PO TABS
10.0000 mg | ORAL_TABLET | Freq: Three times a day (TID) | ORAL | Status: DC
  Filled 2020-07-05: qty 1

## 2020-07-05 MED ORDER — OXYCODONE HCL 5 MG PO TABS
5.0000 mg | ORAL_TABLET | ORAL | Status: DC | PRN
  Administered 2020-07-05 – 2020-07-09 (×13): 5 mg via ORAL
  Filled 2020-07-05 (×15): qty 1

## 2020-07-05 MED ORDER — BISACODYL 10 MG RE SUPP: 10.0000 mg | Freq: Every day | RECTAL | Status: DC | PRN

## 2020-07-05 NOTE — Consult Note (Signed)
Radiation Oncology         [phone]) [phone] ________________________________  Initial inpatient Consultation  Name: Vicki Moon MRN: [id number]  Date of Service: 07/05/20 DOB: 1951/06/15  IY:MEBR, Vicki Areola, MD     REFERRING PHYSICIAN: Dr. [name]  DIAGNOSIS: The primary encounter diagnosis was Left flank pain. Diagnoses of Left-sided chest wall pain, Multiple myeloma not having achieved remission ([place]), AKI (acute kidney injury) ([place]), Tumor, Obstructive uropathy, Pulmonary emboli ([place]), and Hydronephrosis of left kidney were also pertinent to this visit.   HISTORY OF PRESENT ILLNESS: Vicki Moon is a 69 y.o. female seen at the request of Dr. [name] for history of multiple myeloma.  The patient was originally diagnosed in the spring 2020.  She has been on multiple systemic agents, and last summer received palliative radiotherapy to the lumbar spine from L1-L3 under the care of Dr. Francesca Jewett in [place].  She recently was found to have progressive disease and received a palliative course of radiotherapy to the chest wall along the right axilla.  Persistent progression was noted and she recently completed a palliative course again but to the left lateral ribs.  She was going to be starting on [place] next week but presented to the emergency department due to significant pain in her left flank.  She is also been experiencing right groin pain.  CT imaging on 07/03/2020 revealed severe progression of multiple nodal areas of her myeloma including posterior mediastinal, left posterior pleural, left retrocrural and retroperitoneal tumor involving the aorta from the level of the descending segment of the aorta through the level of the renal arteries, invasion of the aorta could not be ruled out due to the absence of IV contrast, both the left pelvic and presacral adenopathy appearing to obstruct the distal left ureter and a 3 mm distal left ureteral calculus was noted.  She also had bulky intramuscular  tumor at the medial right hip and underlying diffuse skeletal myelomatous involvement.  Given the findings we have been asked to consider palliative radiotherapy for this patient. Of note Dr. Alinda Money placed left ureteral stent yesterday and removed renal calculi.    PREVIOUS RADIATION THERAPY: Yes   06/18/20-06/22/20:  The left lateral ribs were treated to 40 Gy in 5 fractions  05/31/20-06/13/20:  The right axilla/chest wall tumor was treated to 30 Gy in 10 fractions  05/17/2019-06/15/2019:  The L1-L3 vertebrae were treated to 30 Gy in 10 fractions  PAST MEDICAL HISTORY:  Past Medical History:  Diagnosis Date  . Diabetes mellitus without complication (HCC)    Type 2 IDDM x 5 years  . 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 Glaucoma   . Goals of care, counseling/discussion 04/29/2019  . Herniated lumbar intervertebral disc 06/2014  . Multiple myeloma ([place]) 04/29/2019  . PONV (postoperative nausea and vomiting)       PAST SURGICAL HISTORY: Past Surgical History:  Procedure Laterality Date  . CHOLECYSTECTOMY    . COLONOSCOPY N/A 06/28/2015   Procedure: COLONOSCOPY;  Surgeon: Rogene Houston, MD;  Location[facility];  Service: Endoscopy;  Laterality: N/A;  155  . CYSTOSCOPY/URETEROSCOPY/HOLMIUM LASER/STENT PLACEMENT Left 07/04/2020   Procedure: CYSTOSCOPY/URETEROSCOP/STENT PLACEMENT left retrograde pylegram;  Surgeon: Raynelle Bring, MD;  Location: [facility];  Service: Urology;  Laterality: Left;  . EYE SURGERY    . IR BONE MARROW BIOPSY & ASPIRATION  03/16/2020  . IR BONE TUMOR(S)RF ABLATION  04/28/2019  . IR KYPHO LUMBAR INC FX REDUCE BONE BX UNI/BIL CANNULATION INC/IMAGING  04/28/2019  .  IR RADIOLOGIST EVAL & MGMT  04/22/2019  . LUMBAR LAMINECTOMY Right 07/12/2014   Procedure: LUMBAR FIVE TO SACRAL ONE MICRODISCECTOMY;  Surgeon: Marybelle Killings, MD;  Location: [facility];  Service: Orthopedics;  Laterality: Right;  . TUBAL LIGATION    . VITRECTOMY 25 GAUGE WITH SCLERAL BUCKLE Left 07/29/2017    Procedure: RETINAL DETACHMENT REPAIR LEFT EYE WITH PARSPLANA VITRECTOMY, AIR FLUID EXCHANGE, ENDO LASTER, DRAINAGE OF SUBRETINAL FLUID,ENDOLASER PPV /25 GAUGE;  Surgeon: Jalene Mullet, MD;  Location: [facility];  Service: Ophthalmology;  Laterality: Left;    FAMILY HISTORY:  Family History  Problem Relation Age of Onset  . Hypertension Mother   . Stroke Mother   . Cancer Father        brain tumor  . Diabetes Father     SOCIAL HISTORY:  Social History   Socioeconomic History  . Marital status: Married    Spouse name: Not on file  . Number of children: Not on file  . Years of education: Not on file  . Highest education level: Not on file  Occupational History  . Not on file  Tobacco Use  . Smoking status: Current Some Day Smoker    Packs/day: 1.00    Years: 10.00    Pack years: 10.00    Types: Cigarettes    Last attempt to quit: 02/20/2019    Years since quitting: 1.3  . Smokeless tobacco: Never Used  . Tobacco comment: 1 pack/month   Vaping Use  . Vaping Use: Never used  Substance and Sexual Activity  . Alcohol use: No    Alcohol/week: 0.0 standard drinks  . Drug use: No  . Sexual activity: Yes  Other Topics Concern  . Not on file  Social History Narrative  . Not on file   Social Determinants of Health   Financial Resource Strain:   . Difficulty of Paying Living Expenses:   Food Insecurity:   . Worried About Charity fundraiser in the Last Year:   . Arboriculturist in the Last Year:   Transportation Needs:   . Film/video editor (Medical):   Marland Kitchen Lack of Transportation (Non-Medical):   Physical Activity:   . Days of Exercise per Week:   . Minutes of Exercise per Session:   Stress:   . Feeling of Stress :   Social Connections:   . Frequency of Communication with Friends and Family:   . Frequency of Social Gatherings with Friends and Family:   . Attends Religious Services:   . Active Member of Clubs or Organizations:   . Attends Archivist  Meetings:   Marland Kitchen Marital Status:   Intimate Partner Violence:   . Fear of Current or Ex-Partner:   . Emotionally Abused:   Marland Kitchen Physically Abused:   . Sexually Abused:     ALLERGIES: Metformin  MEDICATIONS:  Current Facility-Administered Medications  Medication Dose Route Frequency Provider Last Rate Last Admin  . acetaminophen (TYLENOL) tablet 650 mg  650 mg Oral Q6H PRN Raynelle Bring, MD   650 mg at 07/05/20 0945   Or  . acetaminophen (TYLENOL) suppository 650 mg  650 mg Rectal Q6H PRN Raynelle Bring, MD      . ALPRAZolam Duanne Moron) tablet 0.5 mg  0.5 mg Oral QHS PRN Raynelle Bring, MD      . bisacodyl (DULCOLAX) suppository 10 mg  10 mg Rectal Daily PRN Lavina Hamman, MD      . cyclobenzaprine (FLEXERIL) tablet 10 mg  10 mg Oral TID Rolly Salter, MD   10 mg at 07/05/20 1054  . heparin injection 5,000 Units  5,000 Units Subcutaneous Q8H Heloise Purpura, MD   5,000 Units at 07/05/20 0426  . HYDROmorphone (DILAUDID) injection 1 mg  1 mg Intravenous Q3H PRN Heloise Purpura, MD   1 mg at 07/05/20 0741  . insulin aspart (novoLOG) injection 0-5 Units  0-5 Units Subcutaneous TID WC Heloise Purpura, MD      . insulin aspart (novoLOG) injection 0-5 Units  0-5 Units Subcutaneous QHS Heloise Purpura, MD      . labetalol (NORMODYNE) injection 10 mg  10 mg Intravenous Q4H PRN Heloise Purpura, MD      . lactated ringers infusion   Intravenous Continuous Heloise Purpura, MD 75 mL/hr at 07/04/20 1142 New Bag at 07/04/20 1142  . lactulose (CHRONULAC) 10 GM/15ML solution 20 g  20 g Oral Daily Rolly Salter, MD   20 g at 07/05/20 1054  . latanoprost (XALATAN) 0.005 % ophthalmic solution 1 drop  1 drop Both Eyes QHS Heloise Purpura, MD   1 drop at 07/04/20 2330  . LORazepam (ATIVAN) injection 1 mg  1 mg Intravenous Q6H PRN Heloise Purpura, MD      . ondansetron [facility]) tablet 4 mg  4 mg Oral Q6H PRN Heloise Purpura, MD       Or  . ondansetron [facility]) injection 4 mg  4 mg Intravenous Q6H PRN Heloise Purpura, MD    4 mg at 07/03/20 1053  . oxyCODONE-acetaminophen (PERCOCET/ROXICET) 5-325 MG per tablet 1 tablet  1 tablet Oral Q4H PRN Rolly Salter, MD       And  . oxyCODONE (Oxy IR/ROXICODONE) immediate release tablet 5 mg  5 mg Oral Q4H PRN Rolly Salter, MD      . senna-docusate (Senokot-S) tablet 1 tablet  1 tablet Oral BID Rolly Salter, MD   1 tablet at 07/05/20 1054    REVIEW OF SYSTEMS:  On review of systems, the patient reports that she is having quite a bit of pain in her right groin. She is unable to tell if the buttock area is any larger than usual. She reports the pain is deep and sometimes sharp. The pain in her left flank was also sharp but has lessened since she had ureteral stent placement yesterday. She notes constipation. Otherwise no complaints are verbalized.    PHYSICAL EXAM:  Wt Readings from Last 3 Encounters:  07/03/20 152 lb 5.4 oz (69.1 kg)  06/21/20 155 lb (70.3 kg)  05/24/20 156 lb (70.8 kg)   Temp Readings from Last 3 Encounters:  07/05/20 97.7 F (36.5 C) (Oral)  06/21/20 (!) 96.6 F (35.9 C) (Temporal)  05/28/20 (!) 96.8 F (36 C) (Temporal)   BP Readings from Last 3 Encounters:  07/05/20 (!) 132/55  06/21/20 (!) 133/57  05/28/20 (!) 123/46   Pulse Readings from Last 3 Encounters:  07/05/20 71  06/21/20 79  05/28/20 79   Pain Assessment Pain Score: 10-Worst pain ever/10 Unable to assess due to encounter type.  KPS = 50  100 - Normal; no complaints; no evidence of disease. 90   - Able to carry on normal activity; minor signs or symptoms of disease. 80   - Normal activity with effort; some signs or symptoms of disease. 45   - Cares for self; unable to carry on normal activity or to do active work. 60   - Requires occasional assistance, but is able to  care for most of his personal needs. 50   - Requires considerable assistance and frequent medical care. 4   - Disabled; requires special care and assistance. 66   - Severely disabled; hospital  admission is indicated although death not imminent. 33   - Very sick; hospital admission necessary; active supportive treatment necessary. 10   - Moribund; fatal processes progressing rapidly. 0     - Dead  Karnofsky DA, Abelmann [place], Craver LS and Burchenal [facility] [phone]) The use of the nitrogen mustards in the palliative treatment of carcinoma: with particular reference to bronchogenic carcinoma Cancer 1 634-56  LABORATORY DATA:  Lab Results  Component Value Date   WBC 3.2 (L) 07/05/2020   HGB 11.0 (L) 07/05/2020   HCT 35.1 (L) 07/05/2020   MCV 100.0 07/05/2020   PLT 184 07/05/2020   Lab Results  Component Value Date   NA 137 07/05/2020   K 5.1 07/05/2020   CL 106 07/05/2020   CO2 21 (L) 07/05/2020   Lab Results  Component Value Date   ALT 12 07/03/2020   AST 20 07/03/2020   ALKPHOS 83 07/03/2020   BILITOT 0.8 07/03/2020     RADIOGRAPHY: CT Abdomen Pelvis Wo Contrast  Addendum Date: 07/03/2020   ADDENDUM REPORT: 07/03/2020 01:09 ADDENDUM: Study discussed by telephone with Dr. Dina Rich in the ED on 07/03/2020 at 0103 hours. Electronically Signed   By: Genevie Ann M.D.   On: 07/03/2020 01:09   Result Date: 07/03/2020 CLINICAL DATA:  69 year old female with chest pain. Increased chest and left lower abdominal pain for 3 days. Multiple myeloma. Ultrasound-guided lymph node biopsy last month positive for metastatic myeloma. EXAM: CT CHEST, ABDOMEN AND PELVIS WITHOUT CONTRAST TECHNIQUE: Multidetector CT imaging of the chest, abdomen and pelvis was performed following the standard protocol without IV contrast. COMPARISON:  Chest CT 05/08/2020 and earlier. CT Abdomen and Pelvis 06/07/2019. FINDINGS: CT CHEST FINDINGS Cardiovascular: Calcified aortic atherosclerosis. Vascular patency is not evaluated in the absence of IV contrast. No cardiomegaly or pericardial effusion. Mediastinum/Nodes: Large posterior mediastinal soft tissue mass which engulfs the descending thoracic aorta (series 2, image 40) and  is contiguous with mass continuing along the left posterior lung and pleural margin. This now encompasses about 9 cm long axis and appears progressed from a much smaller 4 cm left paraspinal soft tissue mass on 05/08/2020. this abnormal soft tissue also continues inferiorly and is contiguous with bulky retroperitoneal/prevertebral tumor in the upper abdomen which is up to 4 cm in thickness (2 cm in May). No superimposed superior or middle mediastinal lymphadenopathy. No definite hilar lymphadenopathy. Lungs/Pleura: Posterior left lung/pleural tumor contiguous with the posterior mediastinum. Superimposed mild atelectasis. Major airways remain patent. There is also a small but increased rib or pleural based tumor in the anterior left lung on series 3, image 59 measuring about 7 mm (punctate in May). No superimposed hematogenous lung metastasis identified. Musculoskeletal: Diffusely abnormal bone mineralization. Widespread subtle lytic lesions in the ribs, especially the left ribs 3 through 7. Stable mild pathologic compression fracture of the T2 body. No lower thoracic vertebral destruction despite the bulky prevertebral/paraspinal tumor described above. CT ABDOMEN PELVIS FINDINGS Hepatobiliary: Chronically absent gallbladder. Negative noncontrast liver. Pancreas: Negative. Spleen: Negative, although partially inseparable from large left paraspinal and retrocrural tumor. Adrenals/Urinary Tract: Adrenal glands remain normal. Negative noncontrast right kidney and right ureter. Left hydronephrosis and hydroureter, moderate to severe, secondary to likely tumor obstruction of the distal ureter in the pelvis (series 2, image 96) where a bulky  7 cm left presacral soft tissue mass is inseparable from the distal left ureter and the left vaginal fornix. But furthermore, there may also be a superimposed 3 mm distal left ureteral calculus on series 2, image 98, uncertain. No other nephrolithiasis is identified. Both the left  presacral tumor and right lower pelvic intramuscular tumor do not appear to directly involve the urinary bladder which is mildly distended. Stomach/Bowel: Extensive diverticulosis of the large bowel, especially the descending and sigmoid colon but no active inflammation. Normal appendix. No dilated small bowel. No free air or free fluid. Vascular/Lymphatic: Thoracoabdominal aorta is engulfed by tumor from the descending segment through the renal artery level. Vascular patency is not evaluated in the absence of IV contrast. Superimposed Calcified aortic atherosclerosis. Relatively mild superimposed discrete retroperitoneal lymphadenopathy. Reproductive: Left pelvic tumor partially inseparable from the vaginal fornix. Otherwise negative noncontrast uterus and adnexa. Other: No pelvic free fluid. Musculoskeletal: Widespread abnormal bone mineralization, new since 2020. Previous augmentation of L2. No new lumbar compression fracture. No upper lumbar vertebral erosion despite bulky prevertebral/retroperitoneal soft tissue tumor. Infiltrated sacral ala, which may be the source of a large left-side presacral pelvic tumor described in conjunction with the left ureter above. There is also bulky but indistinct intramuscular tumor along the medial right hip (series 2, image 118). And a small 2 cm focus of extraosseous tumor along the left posterior iliac wing on series 2, image 85. IMPRESSION: 1. Severe progression of multiple myeloma since May, most notable for: - bulky and contiguous posterior mediastinal, left posterior pleural, left retrocrural, and retroperitoneal tumor engulfs the Aorta from the descending segment through the level of the renal arteries. Aortic Invasion and/or patency not evaluated in the absence of IV contrast. - bulky left pelvic/presacral tumor which is probably obstructing the distal left ureter, although there might possibly be a superimposed 3 mm distal left ureteral calculus also (series 2, image  98). 2. Additional bulky intramuscular tumor at the medial right hip musculature. Underlying diffuse skeletal myelomatous involvement. Electronically Signed: By: Genevie Ann M.D. On: 07/03/2020 00:53   DG Chest 2 View  Result Date: 07/02/2020 CLINICAL DATA:  Chest pain. EXAM: CHEST - 2 VIEW COMPARISON:  Chest CT 05/08/2020 FINDINGS: The cardiomediastinal contours are normal. The lungs are clear. Pulmonary vasculature is normal. No consolidation, pleural effusion, or pneumothorax. Right scapular lesion on CT is not well seen by radiograph. Callus about left rib fractures, lytic lesions seen on prior chest CT. Callus about few anterior right ribs. Endplate irregularity superior aspect of tentatively L2, chronic based on 06/07/2019 abdominal CT. IMPRESSION: 1. No acute radiographic findings. 2. Majority of the bone lesions on prior CT are not well seen by radiograph. Callus about left greater than right rib fractures, lytic lesions seen on prior chest CT. Electronically Signed   By: Keith Rake M.D.   On: 07/02/2020 17:37   CT Chest Wo Contrast  Addendum Date: 07/03/2020   ADDENDUM REPORT: 07/03/2020 01:09 ADDENDUM: Study discussed by telephone with Dr. Dina Rich in the ED on 07/03/2020 at 0103 hours. Electronically Signed   By: Genevie Ann M.D.   On: 07/03/2020 01:09   Result Date: 07/03/2020 CLINICAL DATA:  69 year old female with chest pain. Increased chest and left lower abdominal pain for 3 days. Multiple myeloma. Ultrasound-guided lymph node biopsy last month positive for metastatic myeloma. EXAM: CT CHEST, ABDOMEN AND PELVIS WITHOUT CONTRAST TECHNIQUE: Multidetector CT imaging of the chest, abdomen and pelvis was performed following the standard protocol without IV contrast. COMPARISON:  Chest CT 05/08/2020 and earlier. CT Abdomen and Pelvis 06/07/2019. FINDINGS: CT CHEST FINDINGS Cardiovascular: Calcified aortic atherosclerosis. Vascular patency is not evaluated in the absence of IV contrast. No cardiomegaly  or pericardial effusion. Mediastinum/Nodes: Large posterior mediastinal soft tissue mass which engulfs the descending thoracic aorta (series 2, image 40) and is contiguous with mass continuing along the left posterior lung and pleural margin. This now encompasses about 9 cm long axis and appears progressed from a much smaller 4 cm left paraspinal soft tissue mass on 05/08/2020. this abnormal soft tissue also continues inferiorly and is contiguous with bulky retroperitoneal/prevertebral tumor in the upper abdomen which is up to 4 cm in thickness (2 cm in May). No superimposed superior or middle mediastinal lymphadenopathy. No definite hilar lymphadenopathy. Lungs/Pleura: Posterior left lung/pleural tumor contiguous with the posterior mediastinum. Superimposed mild atelectasis. Major airways remain patent. There is also a small but increased rib or pleural based tumor in the anterior left lung on series 3, image 59 measuring about 7 mm (punctate in May). No superimposed hematogenous lung metastasis identified. Musculoskeletal: Diffusely abnormal bone mineralization. Widespread subtle lytic lesions in the ribs, especially the left ribs 3 through 7. Stable mild pathologic compression fracture of the T2 body. No lower thoracic vertebral destruction despite the bulky prevertebral/paraspinal tumor described above. CT ABDOMEN PELVIS FINDINGS Hepatobiliary: Chronically absent gallbladder. Negative noncontrast liver. Pancreas: Negative. Spleen: Negative, although partially inseparable from large left paraspinal and retrocrural tumor. Adrenals/Urinary Tract: Adrenal glands remain normal. Negative noncontrast right kidney and right ureter. Left hydronephrosis and hydroureter, moderate to severe, secondary to likely tumor obstruction of the distal ureter in the pelvis (series 2, image 96) where a bulky 7 cm left presacral soft tissue mass is inseparable from the distal left ureter and the left vaginal fornix. But furthermore,  there may also be a superimposed 3 mm distal left ureteral calculus on series 2, image 98, uncertain. No other nephrolithiasis is identified. Both the left presacral tumor and right lower pelvic intramuscular tumor do not appear to directly involve the urinary bladder which is mildly distended. Stomach/Bowel: Extensive diverticulosis of the large bowel, especially the descending and sigmoid colon but no active inflammation. Normal appendix. No dilated small bowel. No free air or free fluid. Vascular/Lymphatic: Thoracoabdominal aorta is engulfed by tumor from the descending segment through the renal artery level. Vascular patency is not evaluated in the absence of IV contrast. Superimposed Calcified aortic atherosclerosis. Relatively mild superimposed discrete retroperitoneal lymphadenopathy. Reproductive: Left pelvic tumor partially inseparable from the vaginal fornix. Otherwise negative noncontrast uterus and adnexa. Other: No pelvic free fluid. Musculoskeletal: Widespread abnormal bone mineralization, new since 2020. Previous augmentation of L2. No new lumbar compression fracture. No upper lumbar vertebral erosion despite bulky prevertebral/retroperitoneal soft tissue tumor. Infiltrated sacral ala, which may be the source of a large left-side presacral pelvic tumor described in conjunction with the left ureter above. There is also bulky but indistinct intramuscular tumor along the medial right hip (series 2, image 118). And a small 2 cm focus of extraosseous tumor along the left posterior iliac wing on series 2, image 85. IMPRESSION: 1. Severe progression of multiple myeloma since May, most notable for: - bulky and contiguous posterior mediastinal, left posterior pleural, left retrocrural, and retroperitoneal tumor engulfs the Aorta from the descending segment through the level of the renal arteries. Aortic Invasion and/or patency not evaluated in the absence of IV contrast. - bulky left pelvic/presacral tumor  which is probably obstructing the distal left ureter, although there might  possibly be a superimposed 3 mm distal left ureteral calculus also (series 2, image 98). 2. Additional bulky intramuscular tumor at the medial right hip musculature. Underlying diffuse skeletal myelomatous involvement. Electronically Signed: By: Genevie Ann M.D. On: 07/03/2020 00:53   DG C-Arm 1-60 Min-No Report  Result Date: 07/04/2020 Fluoroscopy was utilized by the requesting physician.  No radiographic interpretation.   VAS [place] LOWER EXTREMITY VENOUS (DVT)  Result Date: 07/04/2020  Lower Venous DVTStudy Indications: Edema, and concern for PE.  Comparison Study: Lt LEV 12-29-2019 Performing Technologist: Darlin Coco  Examination Guidelines: A complete evaluation includes B-mode imaging, spectral Doppler, color Doppler, and power Doppler as needed of all accessible portions of each vessel. Bilateral testing is considered an integral part of a complete examination. Limited examinations for reoccurring indications may be performed as noted. The reflux portion of the exam is performed with the patient in reverse Trendelenburg.  +---------+---------------+---------+-----------+----------+--------------+ RIGHT    CompressibilityPhasicitySpontaneityPropertiesThrombus Aging +---------+---------------+---------+-----------+----------+--------------+ CFV      Full           Yes      Yes                                 +---------+---------------+---------+-----------+----------+--------------+ SFJ      Full                                                        +---------+---------------+---------+-----------+----------+--------------+ FV Prox  Full                                                        +---------+---------------+---------+-----------+----------+--------------+ FV Mid   Full                                                         +---------+---------------+---------+-----------+----------+--------------+ FV DistalFull                                                        +---------+---------------+---------+-----------+----------+--------------+ PFV      Full                                                        +---------+---------------+---------+-----------+----------+--------------+ POP      Full           Yes      Yes                                 +---------+---------------+---------+-----------+----------+--------------+ PTV      Full                                                        +---------+---------------+---------+-----------+----------+--------------+  PERO     Full                                                        +---------+---------------+---------+-----------+----------+--------------+   +---------+---------------+---------+-----------+----------+--------------+ LEFT     CompressibilityPhasicitySpontaneityPropertiesThrombus Aging +---------+---------------+---------+-----------+----------+--------------+ CFV      Full           Yes      Yes                                 +---------+---------------+---------+-----------+----------+--------------+ SFJ      Full                                                        +---------+---------------+---------+-----------+----------+--------------+ FV Prox  Full                                                        +---------+---------------+---------+-----------+----------+--------------+ FV Mid   Full                                                        +---------+---------------+---------+-----------+----------+--------------+ FV DistalFull                                                        +---------+---------------+---------+-----------+----------+--------------+ PFV      Full                                                         +---------+---------------+---------+-----------+----------+--------------+ POP      Full           Yes      Yes                                 +---------+---------------+---------+-----------+----------+--------------+ PTV      Full                                                        +---------+---------------+---------+-----------+----------+--------------+ PERO     Full                                                        +---------+---------------+---------+-----------+----------+--------------+  Summary: RIGHT: - There is no evidence of deep vein thrombosis in the lower extremity.  - No cystic structure found in the popliteal fossa.  LEFT: - There is no evidence of deep vein thrombosis in the lower extremity.  - No cystic structure found in the popliteal fossa.  *See table(s) above for measurements and observations. Electronically signed by Curt Jews MD on 07/04/2020 at 7:38:38 PM.    Final       IMPRESSION/PLAN: 48. 68 y.o. female with multiple myeloma not having achieved remission with multiple bony and nodal metastases. Dr. Tammi Klippel has reviewed the patient's case and the course thus far. We have requested and reviewed her prior treatment records from Dr. Ezzard Standing clinic in [place]. She does not appear to have been treated to the right pelvis or retroperitoneal sites and hence, Dr. Tammi Klippel would like to offer a course of palliative radiotherapy.  We discussed the risks, benefits, short, and long term effects of radiotherapy, and the patient is interested in proceeding. Dr. Tammi Klippel has recommended a course of 20 Gy in 10 fractions over 2 weeks to these sites. She will come for simulation tomorrow in preparation of beginning treatment.  2. Acute renal insufficiency from intrinsic compression by renal calculi and extrinsic compression from her multiple myeloma.  The patient's symptoms of pain have improved and her creatinine has also improved since her procedure.  We will  continue to follow this but feel that this area causing extrinsic compression from her tumor should still be treated.  In a visit lasting 90 minutes, greater than 50% of the time was spent by phone and in floor time discussing the patient's condition, in preparation for the discussion, and coordinating the patient's care.    Carola Rhine, [facility]   Page Me    Seen on behalf of _____________________________________  Sheral Apley Tammi Klippel, M.D.

## 2020-07-05 NOTE — TOC Initial Note (Signed)
Transition of Care [facility]) - Initial/Assessment Note    Patient Details  Name: Vicki Moon MRN: [id number] Date of Birth: Nov 02, 1951  Transition of Care [facility]) CM/SW Contact:    Lynnell Catalan, RN Phone Number: 07/05/2020, 3:58 PM  Clinical Narrative:                 [facility] consult for home palliative services. This CM met with pt at bedside for dc planning. Pt states she has a walker at home and she lives with her husband. Choice was offered for home palliative services and [facility] was chosen. [facility] was contacted for referral.   Expected Discharge Plan: [place] (Home Palliative) Barriers to Discharge: Continued Medical Work up  Expected Discharge Plan and Services Expected Discharge Plan: [place] (Home Palliative)   Discharge Planning Services: CM Consult   Living arrangements for the past 2 months: Single Family Home                           HH Arranged: Disease Management          Prior Living Arrangements/Services Living arrangements for the past 2 months: Single Family Home Lives with:: Spouse          Need for Family Participation in Patient Care: Yes (Comment) Care giver support system in place?: Yes (comment)      Activities of Daily Living Home Assistive Devices/Equipment: Cane (specify quad or straight), Other (Comment) (front wheel walker) ADL Screening (condition at time of admission) Patient's cognitive ability adequate to safely complete daily activities?: Yes Is the patient deaf or have difficulty hearing?: No Does the patient have difficulty seeing, even when wearing glasses/contacts?: Yes (macular disease) Does the patient have difficulty concentrating, remembering, or making decisions?: No Patient able to express need for assistance with ADLs?: Yes Does the patient have difficulty dressing or bathing?: No Independently performs ADLs?: Yes (appropriate for developmental age) Does the patient have difficulty  walking or climbing stairs?: Yes (pain to rt groin) Weakness of Legs: None Weakness of Arms/Hands: None  Permission Sought/Granted   Permission granted to share information with : Yes, Verbal Permission Granted              Emotional Assessment Appearance:: Appears stated age Attitude/Demeanor/Rapport: Engaged Affect (typically observed): Stable Orientation: : Oriented to Self, Oriented to Place, Oriented to  Time, Oriented to Situation      Admission diagnosis:  Obstructive uropathy [N13.9] Left flank pain [R10.9] Left-sided chest wall pain [R07.89] Hydronephrosis of left kidney [N13.30] Multiple myeloma not having achieved remission (HCC) [C90.00] AKI (acute kidney injury) ([place]) [N17.9] Pulmonary emboli (HCC) [I26.99] Multiple myeloma ([place]) [C90.00] Tumor [D49.9] Patient Active Problem List   Diagnosis Date Noted  . Hydronephrosis of left kidney 07/03/2020  . Elevated d-dimer 07/03/2020  . Dehydration 03/13/2020  . 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 Hypokalemia   . Furunculosis 05/25/2019  . 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 Diabetes mellitus without complication ([place]) 13/24/4010  . Multiple Skin abscessed due to MRSA 05/24/2019  . Pancytopenia ([place]) 05/24/2019  . Multiple myeloma ([place]) 04/29/2019  . Goals of care, counseling/discussion 04/29/2019  . Pain in abdominal muscle of right flank 03/08/2019  . Abscess of right axilla   . Cellulitis of axilla, right   . Cellulitis 12/17/2018  . AKI (acute kidney injury) ([place]) 12/17/2018  . Anemia 12/17/2018  . Type 2 diabetes mellitus ([place]) 12/17/2018  . Depression 12/17/2018  .  Trochanteric bursitis, right hip 08/12/2018  . Post-menopausal 01/19/2018  . Current smoker 01/19/2018  . Mixed hyperlipidemia 07/13/2017  . Class 2 severe obesity due to excess calories with serious comorbidity and body mass index (BMI) of 37.0 to 37.9 in adult ([place]) 07/13/2017  . Hypercortisolemia 01/20/2017  .  Excessive weight gain 01/20/2017  . Generalized abdominal pain 01/20/2017  . Uncontrolled type 2 diabetes mellitus with complication, with long-term current use of insulin ([place]) 12/20/2015  . Essential hypertension, benign 12/20/2015  . Vitamin D deficiency 12/20/2015  . Rectal bleeding 05/29/2015  . HNP (herniated nucleus pulposus), lumbar 07/12/2014    Class: Diagnosis of   PCP:  [name], MD Pharmacy:   [facility] [address], [place] [address] Phone: [phone] Fax: [place], [place] [address] [place] [address] Phone: [phone] Fax: [phone]  [facility] - [place], [address] [place] [address] Phone: [phone] Fax: [phone]  [facility], [place] - [address] [place] [address] Phone: [phone] Fax: [phone]     Social Determinants of Health ([place]) Interventions    Readmission Risk Interventions Readmission Risk Prevention Plan 07/05/2020 05/30/2019  Transportation Screening Complete Complete  PCP or Specialist Appt within 3-5 Days Complete Complete  HRI or Home Care Consult Complete Not Complete  HRI or Home Care Consult comments - not needed.   Social Work Consult for [place] Planning/Counseling Not Complete Complete  SW consult not completed comments NA -  Palliative Care Screening Complete Complete  Medication Review Press photographer) Complete Complete  Some recent data might be hidden

## 2020-07-05 NOTE — Consult Note (Signed)
Consultation Note Date: 07/05/2020   Patient Name: Vicki Moon  DOB: Jul 26, 1951  MRN: [id number]  Age / Sex: 69 y.o., female   PCP: [name], MD Referring Physician: Lavina Hamman, MD   REASON FOR CONSULTATION:Establishing goals of care  Palliative Care consult requested for goals of care discussion in this 69 y.o. female with multiple medical problems including diabetes type 2, glaucoma, anxiety, herniated disc, and multiple myeloma (2020) followed by Dr. [name] s/p multiple chemotherapy agents and palliative radiation to the lumbar spine (L1-L3). Patient presented to the ED with complaints of worsening left flank pain and back pain. She endorses chronic pain which is being managed by Dr. [name]. During ED work-up CR 1.52, D-dimer 4.96. CT of abdomen/pelvis showed severe progressing of multiple myeloma since May, notable for multiple tumors with aortic invasion, bulky pelvic tumor obstructing left ureter, and left kidney stone. Since admission patient evaluated and underwent a cystoscopy with left stone removal and stent placement (07/04/20). She reports decrease in back pain and some decrease in flank pain post-procedure.    Clinical Assessment and Goals of Care: I have reviewed medical records including lab results, imaging, Epic notes, and MAR, received report from the bedside RN, and assessed the patient. I met at the bedside with [name] and her husband, Josph Macho to discuss diagnosis prognosis, GOC, EOL wishes, disposition and options.  Vicki Moon awake, alert and oriented, and able to engage in discussion. She does complain of some left flank pain.   I introduced Palliative Medicine as specialized medical care for people living with serious illness. It focuses on providing relief from the symptoms and stress of a serious illness. The goal is to improve quality of life for both the patient and the family. Patient and husband verbalized understanding and appreciation of  our support.   We discussed a brief life review of the patient, along with her functional and nutritional status.  Vicki Moon and her husband have been married for more than 51 years. They have 3 sons and 4 grandchildren. She is retired from the [employer] manager. She is a native of [place]. Enjoys spending time with her family and friends. She has a strong International aid/development worker.   Prior to admission she required the use of a can or walker for gait stability. She reports she has chronic pain and sometimes would not move around much to hopefully minimize her discomfort. Her appetite would fluctuate. She reports an approximate weight loss of 55lbs over the past year due to chemotherapy treatments, taste alterations, and nausea.   We discussed Her current illness and what it means in the larger context of Her on-going co-morbidities. With specific discussions regarding her progression of multiple myeloma, chronic pain, and her overall functional decline. Natural disease trajectory and expectations at EOL were discussed.  Vicki Moon and her husband verbalized understanding of her current illness and poor prognosis. She states she knows that she has tried 4-5 treatments in addition to radiation and have either failed to show response or not tolerated. Her husband verbalized her agreement. She states she is interested in one final attempt with chemotherapy as discussed with Dr. [name] several weeks prior if she is a candidate.   I attempted to elicit values and goals of care important to the patient.   She and husband are remaining hopeful for some stability with future treatment (chemo and radiation if offered) and medical care while hospitalized but is also prepared for the worst. She  is open to continued treatments which would potentially relieve some of her pain and provide some extension of life. Support given.  Patient shares she has made goals and so far God has allowed her to achieve most of them.  She wanted to make it to see her grandchild born last October, she wanted nothing more to make it to her family's beach trip (which she just returned from around the 4th of July), and she states if I can make it to see my granddaughter turn 1 in October I will be happy and satisfied with what is to come from there referring to her EOL. Therapeutic listening and support provided.   [name] expressed not wanting his wife to suffer but also not losing her. He reports he knows that she will pass at some point but is hopeful for a little more time.   We discussed goal of effectively managing her pain while hospitalized and also at discharge. Patient reports she was prescribed new medications however she was unable to get due to insurance denial and pharmacy not having in stock. We discussed plans to locate a pharmacy with prescribed medications at discharge to prevent her from having discomfort and not having necessary medications on hand. Patient and husband verbalized appreciation.   She continues to have some pain although improved since procedure. Percocet is now being ordered and patient agrees we will re-evaluate her pain in 24hrs to determine effectiveness. She complains of constipation and has been started on lactulose. Discussed the use of a more long-term bowel regimen in the setting of chronic opioid use and the risk of constipation. Education provided on dietary choices, increase in water intake, and also adhering to a regimen at discharge. We discussed use of Linzess for her constipation. Patient verbalized understanding and appreciation.   Advanced directives, concepts specific to code status, artifical feeding and hydration, and rehospitalization were considered and discussed. Patient does have a documented advanced directive. Vicki Moon reports her husband, Josph Macho is her HCPOA. She would not want any forms of artificial feedings or hydration.   I discussed at length her full code status  with consideration of her current illness and progression of cancer. Patient and husband both agreed that patient would not want to undergo heroic or life-sustaining measures with awareness of co-morbidities and poor prognosis. Education provided on further trauma to her body and limited chances of survival. Patient is requesting DNR/DNI. Husband verbalized agreement. Education provided on changes to be noted in computer and RN to place band to notify staff of her wishes.   Hospice and Palliative Care services outpatient were explained and offered. Patient and family verbalized their understanding and awareness of both palliative and hospice's goals and philosophy of care. Given patient's wishes for continued aggressive interventions (chemoradiation) recommendations for outpatient palliative provided with awareness patient may transition to hospice at anytime. Patient and husband verbalized understanding. She shared she would be interested in hospice with a goal of comfort in her home with family and friends if she is not a candidate for further therapy or at the time therapy options have been exhausted. Support given.   Questions and concerns were addressed. The family was encouraged to call with questions or concerns.  PMT will continue to support holistically.   SOCIAL HISTORY:     reports that she has been smoking cigarettes. She has a 10.00 pack-year smoking history. She has never used smokeless tobacco. She reports that she does not drink alcohol and does not use drugs.  CODE STATUS: DNR  ADVANCE DIRECTIVES: Donald Pore (husband/HCPOA)   SYMPTOM MANAGEMENT: Percocet, bowel regimen  Palliative Prophylaxis:   Bowel Regimen, Frequent Pain Assessment and Turn Reposition  PSYCHO-SOCIAL/SPIRITUAL:  Support System: Family  Desire for further Chaplaincy support: No   Additional Recommendations (Limitations, Scope, Preferences):  Full Scope Treatment, No Artificial Feeding and treat the  treatable  Educations on hospice/palliative    PAST MEDICAL HISTORY: Past Medical History:  Diagnosis Date  . Diabetes mellitus without complication (HCC)    Type 2 IDDM x 5 years  . 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 Glaucoma   . Goals of care, counseling/discussion 04/29/2019  . Herniated lumbar intervertebral disc 06/2014  . Multiple myeloma ([place]) 04/29/2019  . PONV (postoperative nausea and vomiting)     ALLERGIES:  is allergic to metformin.   MEDICATIONS:  Current Facility-Administered Medications  Medication Dose Route Frequency Provider Last Rate Last Admin  . acetaminophen (TYLENOL) tablet 650 mg  650 mg Oral Q6H PRN Raynelle Bring, MD   650 mg at 07/05/20 0945   Or  . acetaminophen (TYLENOL) suppository 650 mg  650 mg Rectal Q6H PRN Raynelle Bring, MD      . ALPRAZolam Duanne Moron) tablet 0.5 mg  0.5 mg Oral QHS PRN Raynelle Bring, MD      . bisacodyl (DULCOLAX) suppository 10 mg  10 mg Rectal Daily PRN Lavina Hamman, MD      . cyclobenzaprine (FLEXERIL) tablet 10 mg  10 mg Oral TID Lavina Hamman, MD   10 mg at 07/05/20 1054  . heparin injection 5,000 Units  5,000 Units Subcutaneous Queen Blossom, MD   5,000 Units at 07/05/20 0426  . HYDROmorphone (DILAUDID) injection 1 mg  1 mg Intravenous Q3H PRN Raynelle Bring, MD   1 mg at 07/05/20 0741  . insulin aspart (novoLOG) injection 0-5 Units  0-5 Units Subcutaneous TID WC Raynelle Bring, MD      . insulin aspart (novoLOG) injection 0-5 Units  0-5 Units Subcutaneous QHS Raynelle Bring, MD      . labetalol (NORMODYNE) injection 10 mg  10 mg Intravenous Q4H PRN Raynelle Bring, MD      . lactated ringers infusion   Intravenous Continuous Raynelle Bring, MD 75 mL/hr at 07/04/20 1142 New Bag at 07/04/20 1142  . lactulose (CHRONULAC) 10 GM/15ML solution 20 g  20 g Oral Daily Lavina Hamman, MD   20 g at 07/05/20 1054  . latanoprost (XALATAN) 0.005 % ophthalmic solution 1 drop  1 drop Both Eyes QHS Raynelle Bring, MD   1  drop at 07/04/20 2330  . LORazepam (ATIVAN) injection 1 mg  1 mg Intravenous Q6H PRN Raynelle Bring, MD      . ondansetron [facility]) tablet 4 mg  4 mg Oral Q6H PRN Raynelle Bring, MD       Or  . ondansetron [facility]) injection 4 mg  4 mg Intravenous Q6H PRN Raynelle Bring, MD   4 mg at 07/03/20 1053  . oxyCODONE-acetaminophen (PERCOCET/ROXICET) 5-325 MG per tablet 1 tablet  1 tablet Oral Q4H PRN Lavina Hamman, MD       And  . oxyCODONE (Oxy IR/ROXICODONE) immediate release tablet 5 mg  5 mg Oral Q4H PRN Lavina Hamman, MD      . senna-docusate (Senokot-S) tablet 1 tablet  1 tablet Oral BID Lavina Hamman, MD   1 tablet at 07/05/20 1054    VITAL SIGNS: BP (!) 132/55 (BP Location: Right Arm)  Pulse 71   Temp 97.7 F (36.5 C) (Oral)   Resp 18   Ht 5' 2"  (1.575 m)   Wt 69.1 kg   SpO2 100%   BMI 27.86 kg/m  Filed Weights   07/02/20 1239 07/03/20 2038  Weight: 70 kg 69.1 kg    Estimated body mass index is 27.86 kg/m as calculated from the following:   Height as of this encounter: 5' 2"  (1.575 m).   Weight as of this encounter: 69.1 kg.  LABS: CBC:    Component Value Date/Time   WBC 3.2 (L) 07/05/2020 0549   HGB 11.0 (L) 07/05/2020 0549   HGB 10.2 (L) 06/21/2020 1350   HCT 35.1 (L) 07/05/2020 0549   PLT 184 07/05/2020 0549   PLT 210 06/21/2020 1350   Comprehensive Metabolic Panel:    Component Value Date/Time   NA 137 07/05/2020 0549   K 5.1 07/05/2020 0549   BUN 17 07/05/2020 0549   CREATININE 1.01 (H) 07/05/2020 0549   CREATININE 1.09 (H) 06/21/2020 1350   CREATININE 0.65 01/12/2018 1004   ALBUMIN 2.8 (L) 07/05/2020 0549     Review of Systems  Constitutional: Positive for activity change and appetite change.  Musculoskeletal: Positive for arthralgias.  Neurological: Positive for weakness.  All other systems reviewed and are negative. Unless otherwise noted, a complete review of systems is negative.  Physical Exam General: NAD, frail chronically-ill  appearing Cardiovascular: regular rate and rhythm Pulmonary: clear ant fields, diminished bilaterally  Abdomen: soft, left lower/upper quad tender, + bowel sounds Neurological: A&O x3, mood appropriate, follows commands   Prognosis: Guarded to Poor   Discharge Planning:  Home with Palliative Services  Recommendations: . DNR/DNI-as requested and confirmed by patient/husband . Continue with current plan of care per medical team  . Patient remains hopeful for some stability/extension of life but also preparing for the worst (no further treatment option, failed treatment options). She is interested in continuing to pursue chemotherapy and radiation if a candidate.  . Patient aware plan to re-evaluate pain effectiveness in 24-48hrs with use of percocet and constipation management. Would consider Linzess for OIC in the setting of long-term use of opioids.  . Outpatient palliative support at discharge with plans to transition to hospice in the future. (TOC referral placed) . PMT will continue to support and follow. Please call team line with urgent needs.   Palliative Performance Scale: PPS 30%               Patient and husband expressed understanding and was in agreement with this plan.   Thank you for allowing the Palliative Medicine Team to assist in the care of this patient.  Time In: 1205 Time Out: 1310 Time Total: 65 min.   Visit consisted of counseling and education dealing with the complex and emotionally intense issues of symptom management and palliative care in the setting of serious and potentially life-threatening illness.Greater than 50%  of this time was spent counseling and coordinating care related to the above assessment and plan.  Signed by:  Alda Lea, AGPCNP-BC Palliative Medicine Team  Phone: [phone] Pager: [phone] Amion: Bjorn Pippin

## 2020-07-05 NOTE — Progress Notes (Signed)
Radiation Oncology         [phone]) [phone] ________________________________  Name: ORIANA HORIUCHI MRN: [id number]  Date: 07/06/2020  DOB: 05/11/1951  INPATIENT  SIMULATION AND TREATMENT PLANNING NOTE    ICD-10-CM   1. Multiple myeloma in relapse ([place])  C90.02     DIAGNOSIS:  69 yo woman with myeloma involving paraaortic adenopathy and a pelvic mass  NARRATIVE:  The patient was brought to the [facility].  Identity was confirmed.  All relevant records and images related to the planned course of therapy were reviewed.  The patient freely provided informed written consent to proceed with treatment after reviewing the details related to the planned course of therapy. The consent form was witnessed and verified by the simulation staff.  Then, the patient was set-up in a stable reproducible  supine position for radiation therapy.  CT images were obtained.  Surface markings were placed.  The CT images were loaded into the planning software.  Then the target and avoidance structures were contoured.  Treatment planning then occurred.  The radiation prescription was entered and confirmed.  Then, I designed and supervised the construction of a total of 5 medically necessary complex treatment devices with MLCs to shield kidneys and vacloc positioner.  I have requested : 3D Simulation  I have requested a DVH of the following structures: left kidney, right kidney and targets.    PLAN:  The patient will receive 20 Gy in 10 fractions.  ________________________________  Sheral Apley Tammi Klippel, M.D.   This document serves as a record of services personally performed by Tyler Pita, MD. It was created on his behalf by Jacqualyn Posey, a trained medical scribe. The creation of this record is based on the scribe's personal observations and the provider's statements to them. This document has been checked and approved by the attending provider.

## 2020-07-05 NOTE — Progress Notes (Signed)
[facility] ([facility])  Hospital Liaison: RN note         Notified by TOC manager of patient/family request for [facility] Palliative services at home after discharge.         Writer spoke with patient to confirm interest and explain services.               [facility] Palliative team will follow up with patient after discharge.         Please call with any hospice or palliative related questions.         Thank you for this referral.         Mary Anne Robertson, RN, CCM  [facility] Hospital Liaison (listed on AMION under [facility]osp[facility])    [phone]   

## 2020-07-05 NOTE — Progress Notes (Addendum)
[facility] Progress Note  Patient: Vicki Moon    NAT:[id number]  DOA: 07/02/2020     Date of Service: the patient was seen and examined on 07/05/2020  Brief hospital course: 69 y.o.femalewith medical history significant oftype 2 diabetes, multiple myeloma s/p prior radiation therapy,, glaucoma, herniated lumbar disc, erythropoietin deficiency anemia, anxiety admitted on 07/03/2020 with worsening abdominal pain with imaging finding of severe progression of multiple myeloma since May 2021 with aortic invasion and underlying diffuse skeletal myelomatous involvement and possible left-sided hydronephrosis due to tumor obstructing the distal left ureter, cannot rule out left ureteral calculus  Currently plan is pain control and follow-up on urology, medical oncology and radiation oncology consultation.  Assessment and Plan: 1)Multiple myeloma with abdominal pain significantly worse due to  severe progression of multiple myeloma since May 2021 patient previously completed radiation therapy Medical oncology consulted. We will follow up on recommendation. Also consulted radiation oncology Highly appreciate their assistance and agree that the patient will require palliative radiation therapy for pain control in the right groin area as well as potentially in the left ureteral area.  2) left-sided hydronephrosis Urinary stone due to tumor obstructing the distal left ureter, cannot rule out left ureteral calculus Urology consulted. Appreciate assistance. Underwent stent placement and cystoscopy on 07/04/2020. Change IV fluids.  3) chronic anemia in the setting of underlying multiple myeloma--H&H appears to be at baseline  4)DM2 controlled -hold Farxiga, Use Novolog/Humalog Sliding scale insulin with Accu-Cheks/Fingersticks as ordered   5)AKI acute kidney injury  due to dehydration, cannot exclude possible left-sided hydronephrosis with possible obstructive uropathy  contributing creatinine on admission= 1.52 , baseline creatinine = 0.8 to 1.0    , creatinine is now= 1.40 ,  renally adjust medications, avoid nephrotoxic agents / dehydration  / hypotension Renal function improving.  Monitor.  6) elevated D-dimer in the setting of underlying multiple myeloma Lower extremity Doppler negative.  7) goals of care conversation. Appreciate palliative care assistance. Patient has multiple rounds of treatment for multiple myeloma as well as multiple rounds of radiation and despite which appears to be having progression of her disease. Currently DNR/DNI.  Outpatient palliative care referral.  Diet: Cardiac diet DVT Prophylaxis:   heparin injection 5,000 Units Start: 07/04/20 1400 SCDs Start: 07/03/20 0248    Advance goals of care discussion: Full code  Family Communication: no family was present at bedside, at the time of interview.   Disposition:  Status is: Inpatient  Remains inpatient appropriate because:Ongoing diagnostic testing needed not appropriate for outpatient work up and IV treatments appropriate due to intensity of illness or inability to take PO   Dispo: The patient is from: Home              Anticipated d/c is to: SNF              Anticipated d/c date is: 2 days              Patient currently is not medically stable to d/c.  Subjective: No nausea no vomiting.  No fever no chills.  No chest pain reports left-sided abdominal pain.  Physical Exam:  General: Appear in mild distress, no Rash; Oral Mucosa Clear, moist. no Abnormal Neck Mass Or lumps, Conjunctiva normal  Cardiovascular: S1 and S2 Present, no Murmur, Respiratory: good respiratory effort, Bilateral Air entry present and Clear to Auscultation, no Crackles, no wheezes Abdomen: Bowel Sound present, Soft and diffuse tenderness Extremities: no Pedal edema, no calf tenderness Neurology: alert and oriented  to time, place, and person affect appropriate. no new focal deficit Gait  not checked due to patient safety concerns  Vitals:   07/05/20 0119 07/05/20 0513 07/05/20 0947 07/05/20 1346  BP: 140/66 (!) 145/60 (!) 132/55 (!) 132/49  Pulse: 69 72 71 72  Resp: 17 17 18 16   Temp: 97.8 F (36.6 C) 98.1 F (36.7 C) 97.7 F (36.5 C) 98.4 F (36.9 C)  TempSrc: Oral Oral Oral Oral  SpO2: 97% 97% 100% 100%  Weight:      Height:        Intake/Output Summary (Last 24 hours) at 07/05/2020 1831 Last data filed at 07/05/2020 1130 Gross per 24 hour  Intake 955 ml  Output 2075 ml  Net -1120 ml   Filed Weights   07/02/20 1239 07/03/20 2038  Weight: 70 kg 69.1 kg    Data Reviewed: I have personally reviewed and interpreted daily labs, tele strips, imagings as discussed above. I reviewed all nursing notes, pharmacy notes, vitals, pertinent old records I have discussed plan of care as described above with RN and patient/family.  CBC: Recent Labs  Lab 07/02/20 1705 07/03/20 0406 07/05/20 0549  WBC 3.7* 3.6* 3.2*  NEUTROABS 2.8 2.7 2.8  HGB 11.6* 9.9* 11.0*  HCT 36.1 33.0* 35.1*  MCV 98.4 104.4* 100.0  PLT 248 203 034   Basic Metabolic Panel: Recent Labs  Lab 07/02/20 1705 07/02/20 2134 07/03/20 0406 07/05/20 0549  NA 136 136 138 137  K 4.6 4.4 4.6 5.1  CL 104 109 110 106  CO2 21* 17* 16* 21*  GLUCOSE 125* 138* 137* 191*  BUN 21 21 21 17   CREATININE 1.52* 1.46* 1.40* 1.01*  CALCIUM 8.6* 7.6* 7.4* 7.6*  PHOS  --   --   --  2.5    Studies: DG C-Arm 1-60 Min-No Report  Result Date: 07/04/2020 Fluoroscopy was utilized by the requesting physician.  No radiographic interpretation.    Scheduled Meds: . cyclobenzaprine  10 mg Oral TID  . heparin injection (subcutaneous)  5,000 Units Subcutaneous Q8H  . insulin aspart  0-5 Units Subcutaneous TID WC  . insulin aspart  0-5 Units Subcutaneous QHS  . lactulose  20 g Oral Daily  . latanoprost  1 drop Both Eyes QHS  . senna-docusate  1 tablet Oral BID   Continuous Infusions: . lactated ringers 75  mL/hr at 07/04/20 1142   PRN Meds: acetaminophen **OR** acetaminophen, ALPRAZolam, bisacodyl, HYDROmorphone (DILAUDID) injection, labetalol, LORazepam, ondansetron **OR** ondansetron (ZOFRAN) IV, oxyCODONE-acetaminophen **AND** oxyCODONE  Time spent: 35 minutes  Author: Berle Mull, MD [facility] 07/05/2020 6:31 PM  To reach On-call, see care teams to locate the attending and reach out via [email or web address]. Between 7PM-7AM, please contact night-coverage If you still have difficulty reaching the attending provider, please page the [facility] (Director on Call) for [facility] on amion for assistance.

## 2020-07-05 NOTE — Anesthesia Postprocedure Evaluation (Signed)
Anesthesia Post Note  Patient: Vicki Moon  Procedure(s) Performed: CYSTOSCOPY/URETEROSCOP/STENT PLACEMENT left retrograde pylegram (Left Ureter)     Patient location during evaluation: PACU Anesthesia Type: General Level of consciousness: awake Pain management: pain level controlled Vital Signs Assessment: post-procedure vital signs reviewed and stable Respiratory status: spontaneous breathing, nonlabored ventilation, respiratory function stable and patient connected to nasal cannula oxygen Cardiovascular status: blood pressure returned to baseline and stable Postop Assessment: no apparent nausea or vomiting Anesthetic complications: no   No complications documented.  Last Vitals:  Vitals:   07/05/20 0947 07/05/20 1346  BP: (!) 132/55 (!) 132/49  Pulse: 71 72  Resp: 18 16  Temp: 36.5 C 36.9 C  SpO2: 100% 100%    Last Pain:  Vitals:   07/05/20 1421  TempSrc:   PainSc: 10-Worst pain ever                 Barney Gertsch P Mickel Schreur

## 2020-07-05 NOTE — Progress Notes (Signed)
Patient ID: Vicki Moon, female   DOB: 12-26-1950, 69 y.o.   MRN: [id number]  1 Day Post-Op Subjective: Pt feeling better.  Pain significantly decreased s/p left ureteral stent placement and stone removal.  Objective: Vital signs in last 24 hours: Temp:  [97.5 F (36.4 C)-98.3 F (36.8 C)] 98.1 F (36.7 C) (07/15 0513) Pulse Rate:  [69-97] 72 (07/15 0513) Resp:  [14-20] 17 (07/15 0513) BP: (140-159)/(58-75) 145/60 (07/15 0513) SpO2:  [94 %-100 %] 97 % (07/15 0513)  Intake/Output from previous day: 07/14 0701 - 07/15 0700 In: 715 [I.V.:615; IV Piggyback:100] Out: 1725 [Urine:1725] Intake/Output this shift: No intake/output data recorded.  Physical Exam:  General: Alert and oriented Abd: No CVAT  Lab Results: Recent Labs    07/02/20 1705 07/03/20 0406 07/05/20 0549  HGB 11.6* 9.9* 11.0*  HCT 36.1 33.0* 35.1*   BMET Recent Labs    07/03/20 0406 07/05/20 0549  NA 138 137  K 4.6 5.1  CL 110 106  CO2 16* 21*  GLUCOSE 137* 191*  BUN 21 17  CREATININE 1.40* 1.01*  CALCIUM 7.4* 7.6*     Studies/Results:  Assessment/Plan: Left ureteral obstruction:  Pt had two small stones and a distinct transition point in the mid ureter that likely is a result of a combination of extrinsic obstruction from malignancy and intrinsic obstruction from stones.  Will plan to leave stent for now with plans to f/u in 6-8 weeks to make long term plans after further discussion with Dr. [name].  Renal function now improved. Will arrange outpatient follow up.    LOS: 2 days   Dutch Gray 07/05/2020, 7:31 AM

## 2020-07-06 ENCOUNTER — Encounter (HOSPITAL_COMMUNITY): Payer: Self-pay | Admitting: Family Medicine

## 2020-07-06 ENCOUNTER — Ambulatory Visit
Admit: 2020-07-06 | Discharge: 2020-07-06 | Disposition: A | Discharge status: Home / Self Care | Payer: Medicare Other | Attending: Radiation Oncology | Admitting: Radiation Oncology

## 2020-07-06 ENCOUNTER — Inpatient Hospital Stay (HOSPITAL_COMMUNITY): Payer: Medicare Other

## 2020-07-06 DIAGNOSIS — C9002 Multiple myeloma in relapse: Secondary | ICD-10-CM

## 2020-07-06 DIAGNOSIS — Z515 Encounter for palliative care: Secondary | ICD-10-CM

## 2020-07-06 DIAGNOSIS — Z7189 Other specified counseling: Secondary | ICD-10-CM

## 2020-07-06 DIAGNOSIS — G893 Neoplasm related pain (acute) (chronic): Secondary | ICD-10-CM

## 2020-07-06 DIAGNOSIS — C9000 Multiple myeloma not having achieved remission: Secondary | ICD-10-CM

## 2020-07-06 DIAGNOSIS — N179 Acute kidney failure, unspecified: Secondary | ICD-10-CM

## 2020-07-06 LAB — RENAL FUNCTION PANEL
Albumin: 2.7 g/dL — ABNORMAL LOW (ref 3.5–5.0)
Anion gap: 8 (ref 5–15)
BUN: 14 mg/dL (ref 8–23)
CO2: 22 mmol/L (ref 22–32)
Calcium: 8.1 mg/dL — ABNORMAL LOW (ref 8.9–10.3)
Chloride: 107 mmol/L (ref 98–111)
Creatinine, Ser: 1 mg/dL (ref 0.44–1.00)
GFR calc Af Amer: >60 mL/min (ref >60)
GFR calc non Af Amer: 57 mL/min — ABNORMAL LOW (ref >60)
Glucose, Bld: 147 mg/dL — ABNORMAL HIGH (ref 70–99)
Phosphorus: 1.3 mg/dL — ABNORMAL LOW (ref 2.5–4.6)
Potassium: 3.6 mmol/L (ref 3.5–5.1)
Sodium: 137 mmol/L (ref 135–145)

## 2020-07-06 LAB — CBC WITH DIFFERENTIAL/PLATELET
Abs Immature Granulocytes: 0.03 10*3/uL (ref 0.00–0.07)
Basophils Absolute: 0 10*3/uL (ref 0.0–0.1)
Basophils Relative: 1 %
Eosinophils Absolute: 0 10*3/uL (ref 0.0–0.5)
Eosinophils Relative: 0 %
HCT: 29.7 % — ABNORMAL LOW (ref 36.0–46.0)
Hemoglobin: 9.7 g/dL — ABNORMAL LOW (ref 12.0–15.0)
Immature Granulocytes: 1 %
Lymphocytes Relative: 7 %
Lymphs Abs: 0.2 10*3/uL — ABNORMAL LOW (ref 0.7–4.0)
MCH: 31.8 pg (ref 26.0–34.0)
MCHC: 32.7 g/dL (ref 30.0–36.0)
MCV: 97.4 fL (ref 80.0–100.0)
Monocytes Absolute: 0.4 10*3/uL (ref 0.1–1.0)
Monocytes Relative: 15 %
Neutro Abs: 1.9 10*3/uL (ref 1.7–7.7)
Neutrophils Relative %: 76 %
Platelets: 188 10*3/uL (ref 150–400)
RBC: 3.05 MIL/uL — ABNORMAL LOW (ref 3.87–5.11)
RDW: 14.6 % (ref 11.5–15.5)
WBC: 2.5 10*3/uL — ABNORMAL LOW (ref 4.0–10.5)
nRBC: 0 % (ref 0.0–0.2)

## 2020-07-06 LAB — GLUCOSE, CAPILLARY
Glucose-Capillary: 135 mg/dL — ABNORMAL HIGH (ref 70–99)
Glucose-Capillary: 102 mg/dL — ABNORMAL HIGH (ref 70–99)
Glucose-Capillary: 116 mg/dL — ABNORMAL HIGH (ref 70–99)
Glucose-Capillary: 125 mg/dL — ABNORMAL HIGH (ref 70–99)

## 2020-07-06 MED ORDER — LACTULOSE 10 GM/15ML PO SOLN
20.0000 g | Freq: Three times a day (TID) | ORAL | Status: DC
  Administered 2020-07-06: 20 g via ORAL
  Filled 2020-07-06 (×2): qty 30

## 2020-07-06 MED ORDER — FENTANYL 50 MCG/HR TD PT72
1.0000 | MEDICATED_PATCH | TRANSDERMAL | Status: DC
  Administered 2020-07-06 – 2020-07-09 (×2): 1 via TRANSDERMAL
  Filled 2020-07-06 (×2): qty 1

## 2020-07-06 MED ORDER — SODIUM CHLORIDE (PF) 0.9 % IJ SOLN
INTRAMUSCULAR | Status: AC
  Filled 2020-07-06: qty 50

## 2020-07-06 MED ORDER — IOHEXOL 350 MG/ML SOLN
100.0000 mL | Freq: Once | INTRAVENOUS | Status: AC | PRN
  Administered 2020-07-06: 100 mL via INTRAVENOUS

## 2020-07-06 NOTE — Progress Notes (Addendum)
Daily Progress Note   Patient Name: Vicki Moon       Date: 07/06/2020 DOB: 07-31-1951  Age: 69 y.o. MRN#: [id number] Attending Physician: Lavina Hamman, MD Primary Care Physician: [name], MD Admit Date: 07/02/2020  Reason for Consultation/Follow-up: Establishing goals of care  Subjective: Patient awake, alert, oriented and able to participate in discussion.   Recently given IV dilaudid following transfer downstairs for radiation simulation. Reports relief from IV dilaudid but does continue to have ongoing discomfort, especially on left side from sternum down to pelvis and her back.   GOC:  PMT provider follow-up from conversation with Alda Lea, PMT NP. Note reviewed in detail. Husband at bedside.   Patient confirms plan to move forward with palliative radiation. She is eager to speak with Dr. [name] again when he is back from vacation.   Discussed in detail pain and symptom management:  -Fentanyl 64mg TD was approved by insurance but [facility]. Also, patient mentions Fentanyl 249m was slightly helping chronic pain but Dr. EnMarin Olpas considering increasing if necessary. (Dr. EnAntonieta Pertote reviewed with recommendation that patient change patch every 2 days instead of 3). Discussed increasing Fentanyl to 5090m Patient/husband agreeable and she continues to have chronic cancer pain.  -Comibunox was NOT approved by insurance per patient and husband.  -Patient feels prn percocet is working better than home dose of Norco. On MAR, patient is to receive Percocet 5-325 + oxycodone 64m51mh prn pain. Patient only received percocet 5-325 over night x2 and this AM,  received oxycodone 5mg 76m Discussed in detail with RN who will give Percocet and oxycodone 5mg a80m330 when due and understands ongoing orders for both to be given at the same time for effective pain relief.  -Dilaudid IV is helping with breakthrough pain. -Last BM, small on 7/14. Lactulose has been scheduled TID. Also Senna scheduled BID. -Encouraged patient to take xanax HS prn sleep/anxiety. She reports relief at home with HS xanax.  Discussed plan for outpatient home palliative referral, explaining this can transition to hospice services when they are ready and/or following radiation and conversations with Dr. EnneveMarin Olp final attempt of chemo.   Answered questions and concerns. Emotional support provided.  Length of Stay: 3  Current Medications: Scheduled Meds:  . cyclobenzaprine  10 mg Oral TID  . heparin injection (subcutaneous)  5,000 Units Subcutaneous Q8H  . insulin aspart  0-5 Units Subcutaneous TID WC  . insulin aspart  0-5 Units Subcutaneous QHS  . lactulose  20 g Oral TID  . latanoprost  1 drop Both Eyes QHS  . senna-docusate  1 tablet Oral BID    Continuous Infusions: . lactated ringers 75 mL/hr at 07/05/20 2216    PRN Meds: acetaminophen **OR** acetaminophen, ALPRAZolam, bisacodyl, HYDROmorphone (DILAUDID) injection, labetalol, LORazepam, ondansetron **OR** ondansetron (ZOFRAN) IV, oxyCODONE-acetaminophen **AND** oxyCODONE  Physical Exam Vitals and nursing note reviewed.  Constitutional:      General: She is awake.  HENT:     Head: Normocephalic and atraumatic.  Pulmonary:     Effort: No tachypnea, accessory muscle usage or respiratory distress.  Skin:    General: Skin is warm and dry.  Neurological:     Mental Status: She is alert and oriented to person, place, and time.  Psychiatric:        Mood and Affect: Mood normal.        Speech: Speech normal.        Behavior: Behavior normal.        Cognition and Memory: Cognition normal.              Vital Signs: BP (!) 143/65 (BP Location: Left Arm)   Pulse 77   Temp (!) 97.4 F (36.3 C) (Oral)   Resp 17   Ht 5' 2"  (1.575 m)   Wt 69.1 kg   SpO2 100%   BMI 27.86 kg/m  SpO2: SpO2: 100 % O2 Device: O2 Device: Room Air O2 Flow Rate: O2 Flow Rate (L/min): 10 L/min  Intake/output summary:   Intake/Output Summary (Last 24 hours) at 07/06/2020 1320 Last data filed at 07/06/2020 1144 Gross per 24 hour  Intake --  Output 700 ml  Net -700 ml   LBM: Last BM Date: 07/05/20 Baseline Weight: Weight: 70 kg Most recent weight: Weight: 69.1 kg       Palliative Assessment/Data: PPS 50%      Patient Active Problem List   Diagnosis Date Noted  . Hydronephrosis of left kidney 07/03/2020  . Elevated d-dimer 07/03/2020  . Dehydration 03/13/2020  . 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 Hypokalemia   . Furunculosis 05/25/2019  . 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 Diabetes mellitus without complication ([place]) 01/75/1025  . Multiple Skin abscessed due to MRSA 05/24/2019  . Pancytopenia ([place]) 05/24/2019  . Multiple myeloma ([place]) 04/29/2019  . Goals of care, counseling/discussion 04/29/2019  . Pain in abdominal muscle of right flank 03/08/2019  . Abscess of right axilla   . Cellulitis of axilla, right   . Cellulitis 12/17/2018  . AKI (acute kidney injury) ([place]) 12/17/2018  . Anemia 12/17/2018  . Type 2 diabetes mellitus ([place]) 12/17/2018  . Depression 12/17/2018  . Trochanteric bursitis, right hip 08/12/2018  . Post-menopausal 01/19/2018  . Current smoker 01/19/2018  . Mixed hyperlipidemia 07/13/2017  . Class 2 severe obesity due to excess calories with serious comorbidity and body mass index (BMI) of 37.0 to 37.9 in adult ([place]) 07/13/2017  . Hypercortisolemia 01/20/2017  . Excessive weight gain 01/20/2017  . Generalized abdominal pain 01/20/2017  . Uncontrolled type 2 diabetes mellitus with complication, with long-term current use of insulin ([place]) 12/20/2015  .  Essential hypertension, benign 12/20/2015  . Vitamin D deficiency 12/20/2015  .  Rectal bleeding 05/29/2015  . HNP (herniated nucleus pulposus), lumbar 07/12/2014    Palliative Care Assessment & Plan   Patient Profile: Palliative Care consult requested for goals of care discussion in this 69 y.o. female with multiple medical problems including diabetes type 2, glaucoma, anxiety, herniated disc, and multiple myeloma (2020) followed by Dr. [name] s/p multiple chemotherapy agents and palliative radiation to the lumbar spine (L1-L3). Patient presented to the ED with complaints of worsening left flank pain and back pain. She endorses chronic pain which is being managed by Dr. [name]. During ED work-up CR 1.52, D-dimer 4.96. CT of abdomen/pelvis showed severe progressing of multiple myeloma since May, notable for multiple tumors with aortic invasion, bulky pelvic tumor obstructing left ureter, and left kidney stone. Since admission patient evaluated and underwent a cystoscopy with left stone removal and stent placement (07/04/20). She reports decrease in back pain and some decrease in flank pain post-procedure. Plan is for palliative radiation to right pelvis/retroperitoneal sites.   Assessment: Multiple myeloma with severe progression since May 2021 Left-sided hydronephrosis, urinary stone s/p stent Chronic cancer-related pain Chronic anemia DM type 2 AKI  Recommendations/Plan:  Patient/husband decision for DNR/DNI on 07/05/20 following PMT conversation. See initial note.   Continue current plan of care and medical management.   Plan for palliative radiation. Outpatient oncology follow-up with Dr. [name] regarding chemo options.   Outpatient palliative referral placed. [facility] aware.   Symptom management  Fentanyl 49mg TD q72h. [place] [facility] [place] [facility] [place] (PTA, patient unable to receive prescribed patches because of pharmacy shortage).     Continue Percocet 5-325 + oxycodone 569mPO q4h prn breakthrough pain. PLEASE GIVE TOGETHER as ordered.   Continue prn IV dilaudid for breakthrough severe pain  Senna BID scheduled  Lactulose TID scheduled  Xanax 0.28m51mO HS prn sleep  Code Status: DNR   Code Status Orders  (From admission, onward)         Start     Ordered   07/05/20 1309  Do not attempt resuscitation (DNR)  Continuous       Question Answer Comment  In the event of cardiac or respiratory ARREST Do not call a "code blue"   In the event of cardiac or respiratory ARREST Do not perform Intubation, CPR, defibrillation or ACLS   In the event of cardiac or respiratory ARREST Use medication by any route, position, wound care, and other measures to relive pain and suffering. May use oxygen, suction and manual treatment of airway obstruction as needed for comfort.      07/05/20 1308        Code Status History    Date Active Date Inactive Code Status Order ID Comments User Context   07/03/2020 0249 07/05/2020 1308 Full Code [id number] MilanD ED   03/13/2020 1141 03/23/2020 1649 Full Code [id number] [id number] Inpatient   05/24/2019 1553 06/01/2019 1844 Full Code [id number] IbaD Inpatient   12/17/2018 2046 12/23/2018 1652 Full Code [id number] LeffD ED   07/12/2014 1843 07/13/2014 1334 Full Code [id number] FootA-C Inpatient   Advance Care Planning Activity    Advance Directive Documentation     Most Recent Value  Type of Advance Directive Healthcare Power of Attorney, Living will  Pre-existing out of facility DNR order (yellow form or pink MOST form) --  "MOST" Form in Place? --  Prognosis:   Poor long-term  Discharge Planning:  Home with outpatient palliative follow-up  Care plan was discussed with RN, Dr. Posey Pronto, patient, husband, RN CM  Thank you for allowing the Palliative Medicine Team to assist in the care of this  patient.   Time In: 1230 Time Out: 1320 Total Time 50 Prolonged Time Billed  no      Greater than 50%  of this time was spent counseling and coordinating care related to the above assessment and plan.  Ihor Dow, DNP, FNP-C Palliative Medicine Team  Phone: [phone] Fax: [phone]  Please contact Palliative Medicine Team phone at [phone] for questions and concerns.

## 2020-07-06 NOTE — Care Management Important Message (Signed)
Important Message  Patient Details IM Letter given to [name] RN Case Manager to present to the Patient Name: Vicki Moon MRN: [id number] Date of Birth: 1951/05/03   Medicare Important Message Given:  Yes     Kerin Salen 07/06/2020, 10:56 AM

## 2020-07-06 NOTE — Progress Notes (Signed)
[facility] Progress Note  Patient: Vicki Moon    BTD:[id number]  DOA: 07/02/2020     Date of Service: the patient was seen and examined on 07/06/2020  Brief hospital course: 69 y.o.femalewith medical history significant oftype 2 diabetes, multiple myeloma s/p prior radiation therapy,, glaucoma, herniated lumbar disc, erythropoietin deficiency anemia, anxiety admitted on 07/03/2020 with worsening abdominal pain with imaging finding of severe progression of multiple myeloma since May 2021 with aortic invasion and underlying diffuse skeletal myelomatous involvement and possible left-sided hydronephrosis due to tumor obstructing the distal left ureter, cannot rule out left ureteral calculus  Currently plan is pain control.  Assessment and Plan: 1)Multiple myeloma with abdominal pain significantly worse due to  severe progression of multiple myeloma since May 2021 patient previously completed radiation therapy Medical oncology consulted. We will follow up on recommendation. Also consulted radiation oncology Highly appreciate their assistance and agree that the patient will require palliative radiation therapy for pain control in the right groin area as well as potentially in the left ureteral area.  2) left-sided hydronephrosis Urinary stone due to tumor obstructing the distal left ureter, cannot rule out left ureteral calculus Urology consulted. Appreciate assistance. Underwent stent placement and cystoscopy on 07/04/2020. Change IV fluids.  3) chronic anemia in the setting of underlying multiple myeloma--H&H appears to be at baseline  4)DM2 controlled -hold Farxiga, Use Novolog/Humalog Sliding scale insulin with Accu-Cheks/Fingersticks as ordered   5)AKI acute kidney injury  due to dehydration, cannot exclude possible left-sided hydronephrosis with possible obstructive uropathy contributing creatinine on admission= 1.52 , baseline creatinine = 0.8 to 1.0    ,  creatinine is now= 1.40 ,  renally adjust medications, avoid nephrotoxic agents / dehydration  / hypotension Renal function improving.  Monitor.  6) elevated D-dimer in the setting of underlying multiple myeloma Lower extremity Doppler negative.  7) goals of care conversation. Appreciate palliative care assistance. Patient has multiple rounds of treatment for multiple myeloma as well as multiple rounds of radiation and despite which appears to be having progression of her disease. Currently DNR/DNI.  Outpatient palliative care referral.  8.  Pain control. Appreciate palliative care assistance. Patient will be started on fentanyl patch and continue Percocet. Continue as needed Dilaudid for severe pain. Stool softener also ordered. Patient continues to report abdominal pain as well as chest pain. Due to patient's prior evidence of aortic involvement of the tumor a CTA aorta was performed. No evidence of dissection.   Diet: Cardiac diet DVT Prophylaxis:   heparin injection 5,000 Units Start: 07/04/20 1400 SCDs Start: 07/03/20 0248    Advance goals of care discussion: Full code  Family Communication: family was present at bedside, at the time of interview.   Disposition:  Status is: Inpatient  Remains inpatient appropriate because:Ongoing diagnostic testing needed not appropriate for outpatient work up and IV treatments appropriate due to intensity of illness or inability to take PO   Dispo: The patient is from: Home              Anticipated d/c is to: SNF              Anticipated d/c date is: 2 days              Patient currently is not medically stable to d/c.  Subjective: Continues to report pain.  No nausea no vomiting.  No fever no chills.  Reports constipation.  Physical Exam:  General: Appear in mild distress, no Rash; Oral Mucosa Clear, moist.  no Abnormal Neck Mass Or lumps, Conjunctiva normal  Cardiovascular: S1 and S2 Present, no Murmur, Respiratory: good  respiratory effort, Bilateral Air entry present and Clear to Auscultation, no Crackles, no wheezes Abdomen: Bowel Sound present, Soft and diffuse tenderness Extremities: no Pedal edema, no calf tenderness Neurology: alert and oriented to time, place, and person affect appropriate. no new focal deficit Gait not checked due to patient safety concerns  Vitals:   07/05/20 1346 07/05/20 2129 07/06/20 0548 07/06/20 1528  BP: (!) 132/49 136/68 (!) 143/65 136/61  Pulse: 72 79 77 74  Resp: 16 16 17 15   Temp: 98.4 F (36.9 C) (!) 97.4 F (36.3 C) (!) 97.4 F (36.3 C) 98 F (36.7 C)  TempSrc: Oral Oral Oral Oral  SpO2: 100% 98% 100% 99%  Weight:      Height:        Intake/Output Summary (Last 24 hours) at 07/06/2020 1922 Last data filed at 07/06/2020 1816 Gross per 24 hour  Intake --  Output 1100 ml  Net -1100 ml   Filed Weights   07/02/20 1239 07/03/20 2038  Weight: 70 kg 69.1 kg    Data Reviewed: I have personally reviewed and interpreted daily labs, tele strips, imagings as discussed above. I reviewed all nursing notes, pharmacy notes, vitals, pertinent old records I have discussed plan of care as described above with RN and patient/family.  CBC: Recent Labs  Lab 07/02/20 1705 07/03/20 0406 07/05/20 0549 07/06/20 0532  WBC 3.7* 3.6* 3.2* 2.5*  NEUTROABS 2.8 2.7 2.8 1.9  HGB 11.6* 9.9* 11.0* 9.7*  HCT 36.1 33.0* 35.1* 29.7*  MCV 98.4 104.4* 100.0 97.4  PLT 248 203 184 710   Basic Metabolic Panel: Recent Labs  Lab 07/02/20 1705 07/02/20 2134 07/03/20 0406 07/05/20 0549 07/06/20 0532  NA 136 136 138 137 137  K 4.6 4.4 4.6 5.1 3.6  CL 104 109 110 106 107  CO2 21* 17* 16* 21* 22  GLUCOSE 125* 138* 137* 191* 147*  BUN 21 21 21 17 14   CREATININE 1.52* 1.46* 1.40* 1.01* 1.00  CALCIUM 8.6* 7.6* 7.4* 7.6* 8.1*  PHOS  --   --   --  2.5 1.3*    Studies: CT ANGIO CHEST AORTA W/CM & OR WO/CM  Result Date: 07/06/2020 CLINICAL DATA:  Chest pain. EXAM: CT ANGIOGRAPHY  CHEST WITH CONTRAST TECHNIQUE: Multidetector CT imaging of the chest was performed using the standard protocol during bolus administration of intravenous contrast. Multiplanar CT image reconstructions and MIPs were obtained to evaluate the vascular anatomy. CONTRAST:  140m OMNIPAQUE IOHEXOL 350 MG/ML SOLN COMPARISON:  July 03, 2020. FINDINGS: Cardiovascular: Preferential opacification of the thoracic aorta. No evidence of thoracic aortic aneurysm or dissection. Normal heart size. No pericardial effusion. Mediastinum/Nodes: Thyroid gland is unremarkable. Esophagus is unremarkable. As noted on prior CT, there is extensive adenopathy seen in the posterior mediastinum surrounding a large portion of the distal descending thoracic aorta. This adenopathy extends into the retroperitoneal and left retrocrural regions, were continues to surround a large portion of the aorta. Lungs/Pleura: No pneumothorax is noted. Right lung is clear. Moderate left pleural effusion is noted with adjacent subsegmental atelectasis of left lower lobe. Upper Abdomen: Extensive retroperitoneal adenopathy in visualized portion of abdomen as described above. Musculoskeletal: Multiple ill-defined sclerotic densities are noted throughout the thoracic spine and ribs consistent with history of multiple myeloma. No acute fracture is noted. Review of the MIP images confirms the above findings. IMPRESSION: 1. No evidence of thoracic aortic dissection  or aneurysm. 2. Extensive adenopathy is noted in the posterior mediastinum surrounding a large portion of the distal descending thoracic aorta. This adenopathy extends into the retroperitoneal and left retrocrural regions, where it continues to surround a large portion of the aorta. This is consistent with history of multiple myeloma. 3. Moderate left pleural effusion is noted with adjacent subsegmental atelectasis of left lower lobe. 4. Multiple ill-defined sclerotic densities are noted throughout the  thoracic spine and ribs consistent with history of multiple myeloma. Electronically Signed   By: Marijo Conception M.D.   On: 07/06/2020 15:41    Scheduled Meds: . cyclobenzaprine  10 mg Oral TID  . fentaNYL  1 patch Transdermal Q72H  . heparin injection (subcutaneous)  5,000 Units Subcutaneous Q8H  . insulin aspart  0-5 Units Subcutaneous TID WC  . insulin aspart  0-5 Units Subcutaneous QHS  . lactulose  20 g Oral TID  . latanoprost  1 drop Both Eyes QHS  . senna-docusate  1 tablet Oral BID   Continuous Infusions: . lactated ringers 75 mL/hr at 07/05/20 2216   PRN Meds: acetaminophen **OR** acetaminophen, ALPRAZolam, bisacodyl, HYDROmorphone (DILAUDID) injection, labetalol, LORazepam, ondansetron **OR** ondansetron (ZOFRAN) IV, oxyCODONE-acetaminophen **AND** oxyCODONE  Time spent: 35 minutes  Author: Berle Mull, MD [facility] 07/06/2020 7:22 PM  To reach On-call, see care teams to locate the attending and reach out via [email or web address]. Between 7PM-7AM, please contact night-coverage If you still have difficulty reaching the attending provider, please page the [facility] (Director on Call) for [facility] on amion for assistance.

## 2020-07-07 LAB — GLUCOSE, CAPILLARY
Glucose-Capillary: 135 mg/dL — ABNORMAL HIGH (ref 70–99)
Glucose-Capillary: 138 mg/dL — ABNORMAL HIGH (ref 70–99)
Glucose-Capillary: 147 mg/dL — ABNORMAL HIGH (ref 70–99)

## 2020-07-07 LAB — RENAL FUNCTION PANEL
Albumin: 2.6 g/dL — ABNORMAL LOW (ref 3.5–5.0)
Anion gap: 9 (ref 5–15)
BUN: 9 mg/dL (ref 8–23)
CO2: 23 mmol/L (ref 22–32)
Calcium: 7.9 mg/dL — ABNORMAL LOW (ref 8.9–10.3)
Chloride: 106 mmol/L (ref 98–111)
Creatinine, Ser: 0.94 mg/dL (ref 0.44–1.00)
GFR calc Af Amer: >60 mL/min (ref >60)
GFR calc non Af Amer: >60 mL/min (ref >60)
Glucose, Bld: 145 mg/dL — ABNORMAL HIGH (ref 70–99)
Phosphorus: 1.9 mg/dL — ABNORMAL LOW (ref 2.5–4.6)
Potassium: 3.2 mmol/L — ABNORMAL LOW (ref 3.5–5.1)
Sodium: 138 mmol/L (ref 135–145)

## 2020-07-07 LAB — CBC WITH DIFFERENTIAL/PLATELET
Abs Immature Granulocytes: 0.03 10*3/uL (ref 0.00–0.07)
Basophils Absolute: 0 10*3/uL (ref 0.0–0.1)
Basophils Relative: 0 %
Eosinophils Absolute: 0 10*3/uL (ref 0.0–0.5)
Eosinophils Relative: 1 %
HCT: 29.1 % — ABNORMAL LOW (ref 36.0–46.0)
Hemoglobin: 9.6 g/dL — ABNORMAL LOW (ref 12.0–15.0)
Immature Granulocytes: 1 %
Lymphocytes Relative: 5 %
Lymphs Abs: 0.1 10*3/uL — ABNORMAL LOW (ref 0.7–4.0)
MCH: 31.8 pg (ref 26.0–34.0)
MCHC: 33 g/dL (ref 30.0–36.0)
MCV: 96.4 fL (ref 80.0–100.0)
Monocytes Absolute: 0.5 10*3/uL (ref 0.1–1.0)
Monocytes Relative: 17 %
Neutro Abs: 2 10*3/uL (ref 1.7–7.7)
Neutrophils Relative %: 76 %
Platelets: 154 10*3/uL (ref 150–400)
RBC: 3.02 MIL/uL — ABNORMAL LOW (ref 3.87–5.11)
RDW: 14.7 % (ref 11.5–15.5)
WBC: 2.6 10*3/uL — ABNORMAL LOW (ref 4.0–10.5)
nRBC: 0 % (ref 0.0–0.2)

## 2020-07-07 MED ORDER — K PHOS MONO-SOD PHOS DI & MONO 155-852-130 MG PO TABS
500.0000 mg | ORAL_TABLET | Freq: Two times a day (BID) | ORAL | Status: AC
  Administered 2020-07-07 (×2): 500 mg via ORAL
  Filled 2020-07-07 (×2): qty 2

## 2020-07-07 MED ORDER — MIRTAZAPINE 15 MG PO TABS
15.0000 mg | ORAL_TABLET | Freq: Every day | ORAL | Status: DC
  Administered 2020-07-07 – 2020-07-12 (×6): 15 mg via ORAL
  Filled 2020-07-07 (×6): qty 1

## 2020-07-07 MED ORDER — GLUCERNA SHAKE PO LIQD
237.0000 mL | Freq: Three times a day (TID) | ORAL | Status: DC
  Administered 2020-07-07 – 2020-07-09 (×4): 237 mL via ORAL
  Filled 2020-07-07 (×8): qty 237

## 2020-07-07 NOTE — Progress Notes (Addendum)
[facility] Progress Note  Patient: Vicki Moon    OAC:[id number]  DOA: 07/02/2020     Date of Service: the patient was seen and examined on 07/07/2020  Brief hospital course: 69 y.o.femalewith medical history significant oftype 2 diabetes, multiple myeloma s/p prior radiation therapy,, glaucoma, herniated lumbar disc, erythropoietin deficiency anemia, anxiety admitted on 07/03/2020 with worsening abdominal pain with imaging finding of severe progression of multiple myeloma since May 2021 with aortic invasion and underlying diffuse skeletal myelomatous involvement and possible left-sided hydronephrosis due to tumor obstructing the distal left ureter, cannot rule out left ureteral calculus  Currently plan is pain control.  Assessment and Plan: 1)Multiple myeloma with abdominal pain significantly worse due to  severe progression of multiple myeloma since May 2021 patient previously completed radiation therapy Medical oncology consulted. We will follow up on recommendation. Also consulted radiation oncology Highly appreciate their assistance and agree that the patient will require palliative radiation therapy for pain control in the right groin area as well as potentially in the left ureteral area.  2) left-sided hydronephrosis Urinary stone due to tumor obstructing the distal left ureter, cannot rule out left ureteral calculus Urology consulted. Appreciate assistance. Underwent stent placement and cystoscopy on 07/04/2020. Renal function improving. Outpatient follow-up with urology now.  3) chronic anemia in the setting of underlying multiple myeloma--H&H appears to be at baseline  4) DM2 controlled -hold Farxiga, Use Novolog/Humalog Sliding scale insulin with Accu-Cheks/Fingersticks as ordered   5)AKI due to dehydration, cannot exclude possible left-sided hydronephrosis with possible obstructive uropathy contributing creatinine on admission= 1.52 , baseline  creatinine = 0.8 to 1.0    , creatinine is now= 1.40 ,  renally adjust medications, avoid nephrotoxic agents / dehydration  / hypotension Renal function improving.  Monitor.  6) elevated D-dimer in the setting of underlying multiple myeloma Lower extremity Doppler negative.  7) goals of care conversation. Appreciate palliative care assistance. Patient has multiple rounds of treatment for multiple myeloma as well as multiple rounds of radiation and despite which appears to be having progression of her disease. Currently DNR/DNI.  Outpatient palliative care referral.  8)  Pain control. Appreciate palliative care assistance. Patient will be started on fentanyl patch and continue Percocet. Continue as needed Dilaudid for severe pain. Stool softener also ordered. Patient continues to report abdominal pain as well as chest pain. Due to patient's prior evidence of aortic involvement of the tumor a CTA aorta was performed. No evidence of dissection.  9) severe constipation. Corrected with lactulose right now. Currently we will continue bowel regimen.  10)  Poor p.o. intake. Add Remeron. Glucerna. Change diet to regular diet.  11) hypokalemia, hypophosphatemia. Correcting with K-Phos.  Diet: Cardiac diet DVT Prophylaxis:   heparin injection 5,000 Units Start: 07/04/20 1400 SCDs Start: 07/03/20 0248    Advance goals of care discussion: Full code  Family Communication: family was present at bedside, at the time of interview.   Disposition:  Status is: Inpatient  Remains inpatient appropriate because:Ongoing diagnostic testing needed not appropriate for outpatient work up and IV treatments appropriate due to intensity of illness or inability to take PO   Dispo: The patient is from: Home              Anticipated d/c is to: SNF              Anticipated d/c date is: 2 days              Patient currently is not medically  stable to d/c.  Subjective: Continues to report pain.   No nausea no vomiting.  Had 4 bowel movement so far.  No bleeding.  Physical Exam:  General: Appear in mild distress, no Rash; Oral Mucosa Clear, moist. no Abnormal Neck Mass Or lumps, Conjunctiva normal  Cardiovascular: S1 and S2 Present, no Murmur, Respiratory: good respiratory effort, Bilateral Air entry present and Clear to Auscultation, no Crackles, no wheezes Abdomen: Bowel Sound present, Soft and diffuse tenderness Extremities: no Pedal edema, no calf tenderness Neurology: alert and oriented to time, place, and person affect appropriate. no new focal deficit Gait not checked due to patient safety concerns  Vitals:   07/06/20 1528 07/06/20 2027 07/07/20 0557 07/07/20 1401  BP: 136/61 134/60 (!) 157/52 (!) 120/52  Pulse: 74 75 91 83  Resp: _0 Temp: 98 F (36.7 C) 98.5 F (36.9 C) 98.2 F (36.8 C) 98.4 F (36.9 C)  TempSrc: Oral Oral Oral Oral  SpO2: 99% 97% 95% 96%  Weight:      Height:        Intake/Output Summary (Last 24 hours) at 07/07/2020 1826 Last data filed at 07/07/2020 1300 Gross per 24 hour  Intake 240 ml  Output --  Net 240 ml   Filed Weights   07/02/20 1239 07/03/20 2038  Weight: 70 kg 69.1 kg    Data Reviewed: I have personally reviewed and interpreted daily labs, tele strips, imagings as discussed above. I reviewed all nursing notes, pharmacy notes, vitals, pertinent old records I have discussed plan of care as described above with RN and patient/family.  CBC: Recent Labs  Lab 07/02/20 1705 07/03/20 0406 07/05/20 0549 07/06/20 0532 07/07/20 0622  WBC 3.7* 3.6* 3.2* 2.5* 2.6*  NEUTROABS 2.8 2.7 2.8 1.9 2.0  HGB 11.6* 9.9* 11.0* 9.7* 9.6*  HCT 36.1 33.0* 35.1* 29.7* 29.1*  MCV 98.4 104.4* 100.0 97.4 96.4  PLT 248 203 184 188 347   Basic Metabolic Panel: Recent Labs  Lab 07/02/20 2134 07/03/20 0406 07/05/20 0549 07/06/20 0532 07/07/20 0622  NA 136 138 137 137 138  K 4.4 4.6 5.1 3.6 3.2*  CL 109 110 106 107 106  CO2 17*  16* 21* 22 23  GLUCOSE 138* 137* 191* 147* 145*  BUN _1 CREATININE 1.46* 1.40* 1.01* 1.00 0.94  CALCIUM 7.6* 7.4* 7.6* 8.1* 7.9*  PHOS  --   --  2.5 1.3* 1.9*    Studies: No results found.  Scheduled Meds: . cyclobenzaprine  10 mg Oral TID  . feeding supplement (GLUCERNA SHAKE)  237 mL Oral TID BM  . fentaNYL  1 patch Transdermal Q72H  . heparin injection (subcutaneous)  5,000 Units Subcutaneous Q8H  . insulin aspart  0-5 Units Subcutaneous TID WC  . insulin aspart  0-5 Units Subcutaneous QHS  . latanoprost  1 drop Both Eyes QHS  . mirtazapine  15 mg Oral QHS  . phosphorus  500 mg Oral BID  . senna-docusate  1 tablet Oral BID   Continuous Infusions: . lactated ringers 75 mL/hr at 07/05/20 2216   PRN Meds: acetaminophen **OR** acetaminophen, ALPRAZolam, bisacodyl, HYDROmorphone (DILAUDID) injection, labetalol, LORazepam, ondansetron **OR** ondansetron (ZOFRAN) IV, oxyCODONE-acetaminophen **AND** oxyCODONE  Time spent: 35 minutes  Author: Berle Mull, MD [facility] 07/07/2020 6:26 PM  To reach On-call, see care teams to locate the attending and reach out via [email or web address]. Between 7PM-7AM, please contact night-coverage If you still have difficulty reaching the attending provider, please page  the [facility] (Director on Call) for [facility] on amion for assistance.

## 2020-07-07 NOTE — Progress Notes (Signed)
Daily Progress Note   Patient Name: Vicki Moon       Date: 07/07/2020 DOB: 03-Jun-1951  Age: 69 y.o. MRN#: [id number] Attending Physician: Lavina Hamman, MD Primary Care Physician: [name], MD Admit Date: 07/02/2020  Reason for Consultation/Follow-up: Establishing goals of care  Subjective: Patient awake, alert, oriented.   She did have a small BM last night followed by diarrhea. She does not want to take lactulose today.   She does report some relief with ongoing percocet/oxy combination. Dilaudid IV relieves her pain the best. Fentanyl 63mg TD placed yesterday afternoon. She is requesting pain medication this morning. RN notified.   Answered questions for patient. Support provided.   Length of Stay: 4  Current Medications: Scheduled Meds:  . cyclobenzaprine  10 mg Oral TID  . fentaNYL  1 patch Transdermal Q72H  . heparin injection (subcutaneous)  5,000 Units Subcutaneous Q8H  . insulin aspart  0-5 Units Subcutaneous TID WC  . insulin aspart  0-5 Units Subcutaneous QHS  . lactulose  20 g Oral TID  . latanoprost  1 drop Both Eyes QHS  . phosphorus  500 mg Oral BID  . senna-docusate  1 tablet Oral BID    Continuous Infusions: . lactated ringers 75 mL/hr at 07/05/20 2216    PRN Meds: acetaminophen **OR** acetaminophen, ALPRAZolam, bisacodyl, HYDROmorphone (DILAUDID) injection, labetalol, LORazepam, ondansetron **OR** ondansetron (ZOFRAN) IV, oxyCODONE-acetaminophen **AND** oxyCODONE  Physical Exam Vitals and nursing note reviewed.  Constitutional:      General: She is awake.  HENT:     Head: Normocephalic and atraumatic.  Pulmonary:     Effort: No tachypnea, accessory muscle usage or respiratory distress.  Skin:    General: Skin is warm and dry.    Neurological:     Mental Status: She is alert and oriented to person, place, and time.  Psychiatric:        Mood and Affect: Mood normal.        Speech: Speech normal.        Behavior: Behavior normal.        Cognition and Memory: Cognition normal.            Vital Signs: BP (!) 157/52 (BP Location: Left Arm)   Pulse 91   Temp 98.2 F (36.8 C) (Oral)   Resp  16   Ht 5' 2"  (1.575 m)   Wt 69.1 kg   SpO2 95%   BMI 27.86 kg/m  SpO2: SpO2: 95 % O2 Device: O2 Device: Room Air O2 Flow Rate: O2 Flow Rate (L/min): 10 L/min  Intake/output summary:   Intake/Output Summary (Last 24 hours) at 07/07/2020 8099 Last data filed at 07/06/2020 1816 Gross per 24 hour  Intake --  Output 1100 ml  Net -1100 ml   LBM: Last BM Date: 07/05/20 Baseline Weight: Weight: 70 kg Most recent weight: Weight: 69.1 kg       Palliative Assessment/Data: PPS 50%      Patient Active Problem List   Diagnosis Date Noted  . Cancer related pain   . Palliative care by specialist   . Hydronephrosis of left kidney 07/03/2020  . Elevated d-dimer 07/03/2020  . Dehydration 03/13/2020  . 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 Hypokalemia   . Furunculosis 05/25/2019  . 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 Diabetes mellitus without complication ([place]) 83/38/2505  . Multiple Skin abscessed due to MRSA 05/24/2019  . Pancytopenia ([place]) 05/24/2019  . Multiple myeloma ([place]) 04/29/2019  . Goals of care, counseling/discussion 04/29/2019  . Pain in abdominal muscle of right flank 03/08/2019  . Abscess of right axilla   . Cellulitis of axilla, right   . Cellulitis 12/17/2018  . AKI (acute kidney injury) ([place]) 12/17/2018  . Anemia 12/17/2018  . Type 2 diabetes mellitus ([place]) 12/17/2018  . Depression 12/17/2018  . Trochanteric bursitis, right hip 08/12/2018  . Post-menopausal 01/19/2018  . Current smoker 01/19/2018  . Mixed hyperlipidemia 07/13/2017  . Class 2 severe obesity due to excess calories  with serious comorbidity and body mass index (BMI) of 37.0 to 37.9 in adult ([place]) 07/13/2017  . Hypercortisolemia 01/20/2017  . Excessive weight gain 01/20/2017  . Generalized abdominal pain 01/20/2017  . Uncontrolled type 2 diabetes mellitus with complication, with long-term current use of insulin ([place]) 12/20/2015  . Essential hypertension, benign 12/20/2015  . Vitamin D deficiency 12/20/2015  . Rectal bleeding 05/29/2015  . HNP (herniated nucleus pulposus), lumbar 07/12/2014    Palliative Care Assessment & Plan   Patient Profile: Palliative Care consult requested for goals of care discussion in this 69 y.o. female with multiple medical problems including diabetes type 2, glaucoma, anxiety, herniated disc, and multiple myeloma (2020) followed by Dr. [name] s/p multiple chemotherapy agents and palliative radiation to the lumbar spine (L1-L3). Patient presented to the ED with complaints of worsening left flank pain and back pain. She endorses chronic pain which is being managed by Dr. [name]. During ED work-up CR 1.52, D-dimer 4.96. CT of abdomen/pelvis showed severe progressing of multiple myeloma since May, notable for multiple tumors with aortic invasion, bulky pelvic tumor obstructing left ureter, and left kidney stone. Since admission patient evaluated and underwent a cystoscopy with left stone removal and stent placement (07/04/20). She reports decrease in back pain and some decrease in flank pain post-procedure. Plan is for palliative radiation to right pelvis/retroperitoneal sites.   Assessment: Multiple myeloma with severe progression since May 2021 Left-sided hydronephrosis, urinary stone s/p stent Chronic cancer-related pain Chronic anemia DM type 2 AKI  Recommendations/Plan:  Patient/husband decision for DNR/DNI on 07/05/20 following PMT conversation. See initial note.   Continue current plan of care and medical management.   Plan for palliative radiation. Outpatient oncology  follow-up with Dr. [name] regarding chemo options.   Outpatient palliative referral placed. [facility] aware.   Symptom management  Fentanyl 37mg TD  Fredia Sorrow This NP personally called [facility] in [place] and they do have in stock (PTA, patient unable to receive prescribed patches because of pharmacy shortage).   Continue Percocet 5-325 + oxycodone 69m PO q4h prn breakthrough pain. PLEASE GIVE TOGETHER as ordered.   Continue prn IV dilaudid for breakthrough severe pain  Senna BID scheduled  Lactulose TID scheduled  Xanax 0.567mPO HS prn sleep  Code Status: DNR   Code Status Orders  (From admission, onward)         Start     Ordered   07/05/20 1309  Do not attempt resuscitation (DNR)  Continuous       Question Answer Comment  In the event of cardiac or respiratory ARREST Do not call a "code blue"   In the event of cardiac or respiratory ARREST Do not perform Intubation, CPR, defibrillation or ACLS   In the event of cardiac or respiratory ARREST Use medication by any route, position, wound care, and other measures to relive pain and suffering. May use oxygen, suction and manual treatment of airway obstruction as needed for comfort.      07/05/20 1308        Code Status History    Date Active Date Inactive Code Status Order ID Comments User Context   07/03/2020 0249 07/05/2020 1308 Full Code [id number] MilanMD ED   03/13/2020 1141 03/23/2020 1649 Full Code [id number] ShapeNP Inpatient   05/24/2019 1553 06/01/2019 1844 Full Code [id number] IbaMD Inpatient   12/17/2018 2046 12/23/2018 1652 Full Code [id number] LeffMD ED   07/12/2014 1843 07/13/2014 1334 Full Code [id number] FootPA-C Inpatient   Advance Care Planning Activity    Advance Directive Documentation     Most Recent Value  Type of Advance Directive Healthcare Power of Attorney, Living will  Pre-existing out of facility DNR order (yellow form or  pink MOST form) --  "MOST" Form in Place? --       Prognosis:   Poor long-term  Discharge Planning:  Home with outpatient palliative follow-up  Care plan was discussed with RN, patient  Thank you for allowing the Palliative Medicine Team to assist in the care of this patient.   Time In: 0900 Time Out: 0920 Total Time 15 Prolonged Time Billed  no    Greater than 50% of this time was spent counseling and coordinating care related to the above assessment and plan.   MeIhor DowDNP, FNP-C Palliative Medicine Team  Phone: [phone]: [phone] contact Palliative Medicine Team phone at [phone] questions and concerns.

## 2020-07-07 NOTE — Evaluation (Signed)
Physical Therapy Evaluation Patient Details Name: Vicki Moon MRN: [id number] DOB: 01-15-1951 Today's Date: 07/07/2020   History of Present Illness  69 y.o. female with medical history significant of type 2 diabetes, multiple myeloma undergoing radiation therapy, glaucoma, herniated lumbar disc, erythropoietin deficiency anemia, anxiety  admitted on 07/03/2020 with worsening abdominal pain with imaging finding of severe progression of multiple myeloma since May 2021 with aortic invasion and underlying diffuse skeletal myelomatous involvement and possible left-sided hydronephrosis due to tumor obstructing the distal left ureter, cannot rule out left ureteral calculus. s/p L ureteroscopy and stent placement on 7/14.  Clinical Impression   Pt presents with generalized weakness, severe abdominal pain, increased time and effort to mobilize, unsteadiness in standing, and decreased activity tolerance. Pt to benefit from acute PT to address deficits. Pt ambulated room distance with use of RW, and tolerated repeated transfers to stand for toileting and in/out of bed. Pt reporting 2-week period of lower activity PTA due to severe abdominal pain. PT recommending HHPT to decrease fall risk and maximize mobility. PT to progress mobility as tolerated, and will continue to follow acutely.      Follow Up Recommendations Home health PT;Supervision for mobility/OOB    Equipment Recommendations  Other (comment) (tub bench)    Recommendations for Other Services       Precautions / Restrictions Precautions Precautions: Fall Restrictions Weight Bearing Restrictions: No      Mobility  Bed Mobility Overal bed mobility: Needs Assistance Bed Mobility: Supine to Sit;Sit to Supine     Supine to sit: Supervision Sit to supine: Supervision   General bed mobility comments: for safety, increased time and use of bedrails to perform.  Transfers Overall transfer level: Needs assistance Equipment used:  Rolling walker (2 wheeled) Transfers: Sit to/from Stand Sit to Stand: Min guard;From elevated surface         General transfer comment: min guard for safety, STS x4 from bed x2, toilet, and BSC. Verbal cuing for safe hand placement when rising/sitting.  Ambulation/Gait Ambulation/Gait assistance: Min guard Gait Distance (Feet): 20 Feet Assistive device: Rolling walker (2 wheeled) Gait Pattern/deviations: Step-through pattern;Decreased stride length;Trunk flexed Gait velocity: decr   General Gait Details: min guard for safety, pt with forward flexed trunk due to abdominal pain. Verbal cuing for upright posture as tolerated, placement in RW .  Stairs            Wheelchair Mobility    Modified Rankin (Stroke Patients Only)       Balance Overall balance assessment: Needs assistance Sitting-balance support: No upper extremity supported;Feet supported Sitting balance-Leahy Scale: Good     Standing balance support: Bilateral upper extremity supported;During functional activity Standing balance-Leahy Scale: Fair Standing balance comment: able to stand statically without external support, requires RW for ambulation                             Pertinent Vitals/Pain Pain Assessment: 0-10 Pain Score: 10-Worst pain ever Pain Location: abdomen Pain Descriptors / Indicators: Throbbing;Aching Pain Intervention(s): Limited activity within patient's tolerance;Monitored during session;Repositioned;Premedicated before session    [place] expects to be discharged to:: Private residence Living Arrangements: Spouse/significant other Available Help at Discharge: Family;Available 24 hours/day Type of Home: House Home Access: Stairs to enter Entrance Stairs-Rails: Left Entrance Stairs-Number of Steps: 5 Home Layout: One level Home Equipment: Walker - 2 wheels;Cane - single point      Prior Function Level of Independence: Needs assistance  Gait /  Transfers Assistance Needed: pt reports walking with RW  ADL's / Homemaking Assistance Needed: Pt states husband assists with dressing, bathing, cooking, and cleaning; has difficulty in and out of bathtub        Hand Dominance   Dominant Hand: Right    Extremity/Trunk Assessment   Upper Extremity Assessment Upper Extremity Assessment: Generalized weakness    Lower Extremity Assessment Lower Extremity Assessment: Generalized weakness    Cervical / Trunk Assessment Cervical / Trunk Assessment: Normal  Communication   Communication: No difficulties  Cognition Arousal/Alertness: Awake/alert Behavior During Therapy: WFL for tasks assessed/performed Overall Cognitive Status: Within Functional Limits for tasks assessed                                        General Comments      Exercises     Assessment/Plan    PT Assessment Patient needs continued PT services  PT Problem List Decreased strength;Decreased mobility;Decreased activity tolerance;Decreased balance;Decreased knowledge of use of DME;Pain       PT Treatment Interventions DME instruction;Therapeutic activities;Gait training;Therapeutic exercise;Patient/family education;Balance training;Stair training;Functional mobility training;Neuromuscular re-education    PT Goals (Current goals can be found in the Care Plan section)  Acute Rehab PT Goals PT Goal Formulation: With patient Time For Goal Achievement: 07/21/20 Potential to Achieve Goals: Good    Frequency Min 3X/week   Barriers to discharge        Co-evaluation               AM-PAC PT "6 Clicks" Mobility  Outcome Measure Help needed turning from your back to your side while in a flat bed without using bedrails?: A Little Help needed moving from lying on your back to sitting on the side of a flat bed without using bedrails?: A Little Help needed moving to and from a bed to a chair (including a wheelchair)?: A Little Help needed  standing up from a chair using your arms (e.g., wheelchair or bedside chair)?: A Little Help needed to walk in hospital room?: A Little Help needed climbing 3-5 steps with a railing? : A Little 6 Click Score: 18    End of Session   Activity Tolerance: Patient tolerated treatment well;Patient limited by fatigue;Patient limited by pain Patient left: in bed;with call bell/phone within reach;with family/visitor present Nurse Communication: Mobility status;Patient requests pain meds PT Visit Diagnosis: Muscle weakness (generalized) (M62.81);Difficulty in walking, not elsewhere classified (R26.2)    Time: 1027-2536 PT Time Calculation (min) (ACUTE ONLY): 29 min   Charges:   PT Evaluation $PT Eval Low Complexity: 1 Low PT Treatments $Gait Training: 8-22 mins        Adair Lemar E, PT Acute Rehabilitation Services Pager [phone]  Office [phone]  Yohance Hathorne D Elonda Husky 07/07/2020, 12:27 PM

## 2020-07-08 LAB — CBC WITH DIFFERENTIAL/PLATELET
Abs Immature Granulocytes: 0.02 10*3/uL (ref 0.00–0.07)
Basophils Absolute: 0 10*3/uL (ref 0.0–0.1)
Basophils Relative: 0 %
Eosinophils Absolute: 0 10*3/uL (ref 0.0–0.5)
Eosinophils Relative: 1 %
HCT: 29 % — ABNORMAL LOW (ref 36.0–46.0)
Hemoglobin: 9.5 g/dL — ABNORMAL LOW (ref 12.0–15.0)
Immature Granulocytes: 1 %
Lymphocytes Relative: 7 %
Lymphs Abs: 0.2 10*3/uL — ABNORMAL LOW (ref 0.7–4.0)
MCH: 31.3 pg (ref 26.0–34.0)
MCHC: 32.8 g/dL (ref 30.0–36.0)
MCV: 95.4 fL (ref 80.0–100.0)
Monocytes Absolute: 0.5 10*3/uL (ref 0.1–1.0)
Monocytes Relative: 17 %
Neutro Abs: 2 10*3/uL (ref 1.7–7.7)
Neutrophils Relative %: 74 %
Platelets: 156 10*3/uL (ref 150–400)
RBC: 3.04 MIL/uL — ABNORMAL LOW (ref 3.87–5.11)
RDW: 14.7 % (ref 11.5–15.5)
WBC: 2.7 10*3/uL — ABNORMAL LOW (ref 4.0–10.5)
nRBC: 0 % (ref 0.0–0.2)

## 2020-07-08 LAB — GLUCOSE, CAPILLARY
Glucose-Capillary: 172 mg/dL — ABNORMAL HIGH (ref 70–99)
Glucose-Capillary: 122 mg/dL — ABNORMAL HIGH (ref 70–99)
Glucose-Capillary: 161 mg/dL — ABNORMAL HIGH (ref 70–99)
Glucose-Capillary: 151 mg/dL — ABNORMAL HIGH (ref 70–99)
Glucose-Capillary: 146 mg/dL — ABNORMAL HIGH (ref 70–99)

## 2020-07-08 LAB — RENAL FUNCTION PANEL
Albumin: 2.7 g/dL — ABNORMAL LOW (ref 3.5–5.0)
Anion gap: 10 (ref 5–15)
BUN: 8 mg/dL (ref 8–23)
CO2: 25 mmol/L (ref 22–32)
Calcium: 7.6 mg/dL — ABNORMAL LOW (ref 8.9–10.3)
Chloride: 105 mmol/L (ref 98–111)
Creatinine, Ser: 0.98 mg/dL (ref 0.44–1.00)
GFR calc Af Amer: >60 mL/min (ref >60)
GFR calc non Af Amer: 58 mL/min — ABNORMAL LOW (ref >60)
Glucose, Bld: 141 mg/dL — ABNORMAL HIGH (ref 70–99)
Phosphorus: 2.9 mg/dL (ref 2.5–4.6)
Potassium: 3.3 mmol/L — ABNORMAL LOW (ref 3.5–5.1)
Sodium: 140 mmol/L (ref 135–145)

## 2020-07-08 MED ORDER — POTASSIUM CHLORIDE CRYS ER 20 MEQ PO TBCR
40.0000 meq | EXTENDED_RELEASE_TABLET | ORAL | Status: AC
  Administered 2020-07-08 (×2): 40 meq via ORAL
  Filled 2020-07-08 (×2): qty 2

## 2020-07-08 NOTE — Progress Notes (Signed)
   Daily Progress Note   Patient Name: Vicki Moon       Date: 07/08/2020 DOB: Jul 23, 1951  Age: 69 y.o. MRN#: [id number] Attending Physician: Lavina Hamman, MD Primary Care Physician: [name], MD Admit Date: 07/02/2020  Reason for Consultation/Follow-up: Establishing goals of care  Subjective: Patient resting in bed. Easily awaken with verbal stimuli. Complains of continued abdominal pain, however feels current regimen is providing relief. Also somewhat hopeful that pain will improve once radiation is started later today. Reports continued decrease in appetite. Hopeful she will be a candidate for further chemotherapy. Support given. No family at the bedside.   We discussed current pain regimen at length. Patient would like to continue with no changes. I called and confirmed pharmacy in [place] does have Fentanyl patches and Percocet available to prevent any lapse in medication once discharged. They verified they do have both medications available and knows patient will be discharging in the next few days. Patient verbalized appreciation.   Plans to follow back up with Dr. [name] to further discuss treatment options.   Length of Stay: 5 days  Vital Signs: BP 140/68 (BP Location: Left Arm)   Pulse 86   Temp 98 F (36.7 C) (Oral)   Resp 16   Ht 5\' 2"  (1.575 m)   Wt 69.1 kg   SpO2 98%   BMI 27.86 kg/m  SpO2: SpO2: 98 % O2 Device: O2 Device: Room Air O2 Flow Rate: O2 Flow Rate (L/min): 10 L/min  Physical Exam: Awake, alert and oriented x3, chronically-ill, thin Normal breathing pattern RRR Mood appropriate, follows commands           Palliative Care Assessment & Plan    Code Status:  DNR  Goals of Care/Recommendations:  Continue to treat the treatable while hospitalized  Patient remains hopeful for stability and decreased pain with radiation allowing her the ability to tolerate last attempt at chemotherapy.   Will need to continue on current pain regimen at  discharge. I have confirmed [facility] has in stock. If in question could potentially utilize [facility] prior to discharge to make sure she has medications on hand. Will follow-up with Dr. [name] for continued pain support and oncology care.   Will need to adhere to bowel regimen to prevent further pain associated with constipation. [facility]ess is an option for OIC.   Outpatient Palliative support at discharge [facility] referral placed)  PMT will continue to support and follow.   Prognosis: Guarded-Poor   Discharge Planning: Home with Palliative Services at minimum   Thank you for allowing the Palliative Medicine Team to assist in the care of this patient.  Time Total: 25 min.   Visit consisted of counseling and education dealing with the complex and emotionally intense issues of symptom management and palliative care in the setting of serious and potentially life-threatening illness.Greater than 50%  of this time was spent counseling and coordinating care related to the above assessment and plan.  Alda Lea, AGPCNP-BC  Palliative Medicine Team [phone]

## 2020-07-08 NOTE — Progress Notes (Signed)
[facility] Progress Note  Patient: Vicki Moon    WHQ:[id number]  DOA: 07/02/2020     Date of Service: the patient was seen and examined on 07/08/2020  Brief hospital course: 69 y.o.femalewith medical history significant oftype 2 diabetes, multiple myeloma s/p prior radiation therapy,, glaucoma, herniated lumbar disc, erythropoietin deficiency anemia, anxiety admitted on 07/03/2020 with worsening abdominal pain with imaging finding of severe progression of multiple myeloma since May 2021 with aortic invasion and underlying diffuse skeletal myelomatous involvement and possible left-sided hydronephrosis due to tumor obstructing the distal left ureter, cannot rule out left ureteral calculus  Currently plan is pain control.  Assessment and Plan: 1)Multiple myeloma with abdominal pain significantly worse due to  severe progression of multiple myeloma since May 2021 patient previously completed radiation therapy Medical oncology consulted. We will follow up on recommendation. Also consulted radiation oncology Highly appreciate their assistance and agree that the patient will require palliative radiation therapy for pain control in the right groin area as well as potentially in the left ureteral area.  2) left-sided hydronephrosis Urinary stone due to tumor obstructing the distal left ureter, cannot rule out left ureteral calculus Urology consulted. Appreciate assistance. Underwent stent placement and cystoscopy on 07/04/2020. Renal function improving. Outpatient follow-up with urology now.  3) chronic anemia in the setting of underlying multiple myeloma--H&H appears to be at baseline  4) DM2 controlled without any complication -hold Farxiga, Use Novolog/Humalog Sliding scale insulin with Accu-Cheks/Fingersticks as ordered   5)AKI due to dehydration, cannot exclude possible left-sided hydronephrosis with possible obstructive uropathy contributing creatinine on admission=  1.52 , baseline creatinine = 0.8 to 1.0    , creatinine is now= 1.40 ,  renally adjust medications, avoid nephrotoxic agents / dehydration  / hypotension Renal function improving.  Monitor.  6) elevated D-dimer in the setting of underlying multiple myeloma Lower extremity Doppler negative.  7) goals of care conversation. Appreciate palliative care assistance. Patient has multiple rounds of treatment for multiple myeloma as well as multiple rounds of radiation and despite which appears to be having progression of her disease. Currently DNR/DNI.  Outpatient palliative care referral.  8)  Pain control. Appreciate palliative care assistance. Patient will be started on fentanyl patch and continue Percocet. Continue as needed Dilaudid for severe pain. Stool softener also ordered. Patient continues to report abdominal pain as well as chest pain. Due to patient's prior evidence of aortic involvement of the tumor a CTA aorta was performed. No evidence of dissection.  9) severe constipation. Corrected with lactulose right now. Currently we will continue bowel regimen.  10)  Poor p.o. intake. Add Remeron. Glucerna. Change diet to regular diet.  11) hypokalemia, hypophosphatemia. Correcting with K-Phos.  Diet: Cardiac diet DVT Prophylaxis:   heparin injection 5,000 Units Start: 07/04/20 1400 SCDs Start: 07/03/20 0248    Advance goals of care discussion: Full code  Family Communication: No family was present at bedside, at the time of interview.   Disposition:  Status is: Inpatient  Remains inpatient appropriate because:Ongoing diagnostic testing needed not appropriate for outpatient work up and IV treatments appropriate due to intensity of illness or inability to take PO   Dispo: The patient is from: Home              Anticipated d/c is to: SNF              Anticipated d/c date is: 2 days              Patient  currently is not medically stable to d/c.  Subjective: No nausea  no vomiting.  No fever no chills.  Continues to have left-sided abdominal pain.  Passing gas but no BM.  Minimal oral intake.  Physical Exam:  General: Appear in mild distress, no Rash; Oral Mucosa Clear, moist. no Abnormal Neck Mass Or lumps, Conjunctiva normal  Cardiovascular: S1 and S2 Present, no Murmur, Respiratory: good respiratory effort, Bilateral Air entry present and Clear to Auscultation, no Crackles, no wheezes Abdomen: Bowel Sound present, Soft and diffuse tenderness Extremities: no Pedal edema, no calf tenderness Neurology: alert and oriented to time, place, and person affect appropriate. no new focal deficit Gait not checked due to patient safety concerns  Vitals:   07/07/20 1401 07/07/20 2220 07/08/20 0605 07/08/20 1428  BP: (!) 120/52 (!) 150/57 140/68 (!) 128/47  Pulse: 83 85 86 81  Resp: _0 Temp: 98.4 F (36.9 C) 98.5 F (36.9 C) 98 F (36.7 C) 98.1 F (36.7 C)  TempSrc: Oral Oral Oral Oral  SpO2: 96% 98% 98% 96%  Weight:      Height:        Intake/Output Summary (Last 24 hours) at 07/08/2020 1901 Last data filed at 07/08/2020 1521 Gross per 24 hour  Intake 1140 ml  Output 1050 ml  Net 90 ml   Filed Weights   07/02/20 1239 07/03/20 2038  Weight: 70 kg 69.1 kg    Data Reviewed: I have personally reviewed and interpreted daily labs, tele strips, imagings as discussed above. I reviewed all nursing notes, pharmacy notes, vitals, pertinent old records I have discussed plan of care as described above with RN and patient/family.  CBC: Recent Labs  Lab 07/03/20 0406 07/05/20 0549 07/06/20 0532 07/07/20 0622 07/08/20 0715  WBC 3.6* 3.2* 2.5* 2.6* 2.7*  NEUTROABS 2.7 2.8 1.9 2.0 2.0  HGB 9.9* 11.0* 9.7* 9.6* 9.5*  HCT 33.0* 35.1* 29.7* 29.1* 29.0*  MCV 104.4* 100.0 97.4 96.4 95.4  PLT 203 184 188 154 063   Basic Metabolic Panel: Recent Labs  Lab 07/03/20 0406 07/05/20 0549 07/06/20 0532 07/07/20 0622 07/08/20 0715  NA 138 137 137  138 140  K 4.6 5.1 3.6 3.2* 3.3*  CL 110 106 107 106 105  CO2 16* 21* _1 GLUCOSE 137* 191* 147* 145* 141*  BUN _2 CREATININE 1.40* 1.01* 1.00 0.94 0.98  CALCIUM 7.4* 7.6* 8.1* 7.9* 7.6*  PHOS  --  2.5 1.3* 1.9* 2.9    Studies: No results found.  Scheduled Meds:  cyclobenzaprine  10 mg Oral TID   feeding supplement (GLUCERNA SHAKE)  237 mL Oral TID BM   fentaNYL  1 patch Transdermal Q72H   heparin injection (subcutaneous)  5,000 Units Subcutaneous Q8H   insulin aspart  0-5 Units Subcutaneous TID WC   insulin aspart  0-5 Units Subcutaneous QHS   latanoprost  1 drop Both Eyes QHS   mirtazapine  15 mg Oral QHS   senna-docusate  1 tablet Oral BID   Continuous Infusions:  lactated ringers 75 mL/hr at 07/05/20 2216   PRN Meds: acetaminophen **OR** acetaminophen, ALPRAZolam, bisacodyl, HYDROmorphone (DILAUDID) injection, labetalol, LORazepam, ondansetron **OR** ondansetron (ZOFRAN) IV, oxyCODONE-acetaminophen **AND** oxyCODONE  Time spent: 35 minutes  Author: Berle Mull, MD [facility] 07/08/2020 7:01 PM  To reach On-call, see care teams to locate the attending and reach out via [email or web address]. Between 7PM-7AM, please contact night-coverage If you still have difficulty reaching the attending  provider, please page the [facility] (Director on Call) for [facility] on amion for assistance.

## 2020-07-09 ENCOUNTER — Ambulatory Visit
Admit: 2020-07-09 | Discharge: 2020-07-09 | Disposition: A | Discharge status: Home / Self Care | Payer: Medicare Other | Attending: Radiation Oncology | Admitting: Radiation Oncology

## 2020-07-09 DIAGNOSIS — N1330 Unspecified hydronephrosis: Secondary | ICD-10-CM

## 2020-07-09 LAB — GLUCOSE, CAPILLARY
Glucose-Capillary: 155 mg/dL — ABNORMAL HIGH (ref 70–99)
Glucose-Capillary: 123 mg/dL — ABNORMAL HIGH (ref 70–99)
Glucose-Capillary: 229 mg/dL — ABNORMAL HIGH (ref 70–99)

## 2020-07-09 LAB — RENAL FUNCTION PANEL
Albumin: 2.7 g/dL — ABNORMAL LOW (ref 3.5–5.0)
Anion gap: 9 (ref 5–15)
BUN: 7 mg/dL — ABNORMAL LOW (ref 8–23)
CO2: 24 mmol/L (ref 22–32)
Calcium: 8 mg/dL — ABNORMAL LOW (ref 8.9–10.3)
Chloride: 105 mmol/L (ref 98–111)
Creatinine, Ser: 0.82 mg/dL (ref 0.44–1.00)
GFR calc Af Amer: >60 mL/min (ref >60)
GFR calc non Af Amer: >60 mL/min (ref >60)
Glucose, Bld: 156 mg/dL — ABNORMAL HIGH (ref 70–99)
Phosphorus: 2.4 mg/dL — ABNORMAL LOW (ref 2.5–4.6)
Potassium: 3.5 mmol/L (ref 3.5–5.1)
Sodium: 138 mmol/L (ref 135–145)

## 2020-07-09 LAB — CBC WITH DIFFERENTIAL/PLATELET
Abs Immature Granulocytes: 0.03 10*3/uL (ref 0.00–0.07)
Basophils Absolute: 0 10*3/uL (ref 0.0–0.1)
Basophils Relative: 0 %
Eosinophils Absolute: 0 10*3/uL (ref 0.0–0.5)
Eosinophils Relative: 1 %
HCT: 30.2 % — ABNORMAL LOW (ref 36.0–46.0)
Hemoglobin: 9.9 g/dL — ABNORMAL LOW (ref 12.0–15.0)
Immature Granulocytes: 1 %
Lymphocytes Relative: 6 %
Lymphs Abs: 0.2 10*3/uL — ABNORMAL LOW (ref 0.7–4.0)
MCH: 31.5 pg (ref 26.0–34.0)
MCHC: 32.8 g/dL (ref 30.0–36.0)
MCV: 96.2 fL (ref 80.0–100.0)
Monocytes Absolute: 0.4 10*3/uL (ref 0.1–1.0)
Monocytes Relative: 15 %
Neutro Abs: 1.9 10*3/uL (ref 1.7–7.7)
Neutrophils Relative %: 77 %
Platelets: 148 10*3/uL — ABNORMAL LOW (ref 150–400)
RBC: 3.14 MIL/uL — ABNORMAL LOW (ref 3.87–5.11)
RDW: 15.1 % (ref 11.5–15.5)
WBC: 2.5 10*3/uL — ABNORMAL LOW (ref 4.0–10.5)
nRBC: 0 % (ref 0.0–0.2)

## 2020-07-09 MED ORDER — KATE FARMS STANDARD 1.4 PO LIQD
325.0000 mL | Freq: Two times a day (BID) | ORAL | Status: DC
  Administered 2020-07-11 – 2020-07-12 (×3): 325 mL via ORAL
  Filled 2020-07-09 (×9): qty 325

## 2020-07-09 MED ORDER — ADULT MULTIVITAMIN W/MINERALS CH
1.0000 | ORAL_TABLET | Freq: Every day | ORAL | Status: DC
  Administered 2020-07-10 – 2020-07-12 (×3): 1 via ORAL
  Filled 2020-07-09 (×4): qty 1

## 2020-07-09 NOTE — Progress Notes (Signed)
[facility] Progress Note  Patient: Vicki Moon    BCW:[id number]  DOA: 07/02/2020     Date of Service: the patient was seen and examined on 07/09/2020  Brief hospital course: 69 y.o.femalewith medical history significant oftype 2 diabetes, multiple myeloma s/p prior radiation therapy,, glaucoma, herniated lumbar disc, erythropoietin deficiency anemia, anxiety admitted on 07/03/2020 with worsening abdominal pain with imaging finding of severe progression of multiple myeloma since May 2021 with aortic invasion and underlying diffuse skeletal myelomatous involvement and possible left-sided hydronephrosis due to tumor obstructing the distal left ureter, cannot rule out left ureteral calculus.  Currently plan is pain control.  Assessment and Plan: 1) Multiple myeloma with abdominal pain significantly worse due to  severe progression of multiple myeloma since May 2021 patient previously completed radiation therapy Medical oncology consulted. We will follow up on recommendation. Also consulted radiation oncology Highly appreciate their assistance and agree that the patient will require palliative radiation therapy for pain control in the right groin area as well as potentially in the left ureteral area.  2) left-sided hydronephrosis Urinary stone due to tumor obstructing the distal left ureter, cannot rule out left ureteral calculus Urology consulted. Appreciate assistance. Underwent stent placement and cystoscopy on 07/04/2020. Renal function improving. Outpatient follow-up with urology now.  3) chronic anemia in the setting of underlying multiple myeloma--H&H appears to be at baseline  4) DM2 controlled without any complication -hold Farxiga, Use Novolog/Humalog Sliding scale insulin with Accu-Cheks/Fingersticks as ordered   5) AKI resolved due to dehydration, cannot exclude possible left-sided hydronephrosis with possible obstructive uropathy contributing creatinine  on admission= 1.52 , baseline creatinine = 0.8 to 1.0    , creatinine is now= 1.40 ,  renally adjust medications, avoid nephrotoxic agents / dehydration  / hypotension Renal function improving.  Monitor.  6) elevated D-dimer in the setting of underlying multiple myeloma Lower extremity Doppler negative.  7) goals of care conversation. Appreciate palliative care assistance. Patient has multiple rounds of treatment for multiple myeloma as well as multiple rounds of radiation and despite which appears to be having progression of her disease. Currently DNR/DNI.  Outpatient palliative care referral.  8)  Pain control.  Still ongoing Appreciate palliative care assistance. Patient will be started on fentanyl patch and continue Percocet. Continue as needed Dilaudid for severe pain. Stool softener also ordered. Patient reported some chest pain earlier which is currently resolved.  Due to complaints of chest pain, and prior evidence of aortic involvement of the tumor a CTA aorta was performed. No evidence of dissection. Patient continues to report abdominal pain needing IV Dilaudid.  9) severe constipation. Corrected with lactulose Currently we will continue bowel regimen.  10)  Poor p.o. intake. Add Remeron. Glucerna. Change diet to regular diet. Tolerating well.  11) hypokalemia, hypophosphatemia. Corrected with K-Phos.  Diet: Cardiac diet DVT Prophylaxis:   heparin injection 5,000 Units Start: 07/04/20 1400 SCDs Start: 07/03/20 0248   Advance goals of care discussion: Full code  Family Communication: No family was present at bedside, at the time of interview.   Disposition:  Status is: Inpatient  Remains inpatient appropriate because: Still in considerable pain requiring IV pain medication.  Still poor p.o. intake.   Dispo: The patient is from: Home              Anticipated d/c is to: Home with home health              Anticipated d/c date is: Tomorrow 07/10/2020  Patient currently is not medically stable to d/c.  Subjective: Reports left-sided abdominal pain.  No nausea no vomiting.  No fever no chills.  Physical Exam:  General: Appear in mild distress, no Rash; Oral Mucosa Clear, moist. no Abnormal Neck Mass Or lumps, Conjunctiva normal  Cardiovascular: S1 and S2 Present, no Murmur, Respiratory: good respiratory effort, Bilateral Air entry present and Clear to Auscultation, no Crackles, no wheezes Abdomen: Bowel Sound present, Soft and mild tenderness Extremities: no Pedal edema, no calf tenderness Neurology: alert and oriented to time, place, and person affect appropriate. no new focal deficit Gait not checked due to patient safety concerns   Vitals:   07/08/20 1428 07/08/20 2238 07/09/20 0533 07/09/20 1212  BP: (!) 128/47 (!) 145/56 (!) 159/66 (!) 147/63  Pulse: 81 73 81 69  Resp: 18 16 16 18   Temp: 98.1 F (36.7 C) 98.4 F (36.9 C) 97.8 F (36.6 C) 98.1 F (36.7 C)  TempSrc: Oral Oral Oral Oral  SpO2: 96% 97% 98% 97%  Weight:      Height:        Intake/Output Summary (Last 24 hours) at 07/09/2020 1807 Last data filed at 07/09/2020 0700 Gross per 24 hour  Intake 1243.77 ml  Output 1625 ml  Net -381.23 ml   Filed Weights   07/02/20 1239 07/03/20 2038  Weight: 70 kg 69.1 kg    Data Reviewed: I have personally reviewed and interpreted daily labs, tele strips, imagings as discussed above. I reviewed all nursing notes, pharmacy notes, vitals, pertinent old records I have discussed plan of care as described above with RN and patient/family.  CBC: Recent Labs  Lab 07/05/20 0549 07/06/20 0532 07/07/20 0622 07/08/20 0715 07/09/20 0623  WBC 3.2* 2.5* 2.6* 2.7* 2.5*  NEUTROABS 2.8 1.9 2.0 2.0 1.9  HGB 11.0* 9.7* 9.6* 9.5* 9.9*  HCT 35.1* 29.7* 29.1* 29.0* 30.2*  MCV 100.0 97.4 96.4 95.4 96.2  PLT 184 188 154 156 300*   Basic Metabolic Panel: Recent Labs  Lab 07/05/20 0549 07/06/20 0532 07/07/20 0622 07/08/20 0715  07/09/20 0623  NA 137 137 138 140 138  K 5.1 3.6 3.2* 3.3* 3.5  CL 106 107 106 105 105  CO2 21* 22 23 25 24   GLUCOSE 191* 147* 145* 141* 156*  BUN 17 14 9 8  7*  CREATININE 1.01* 1.00 0.94 0.98 0.82  CALCIUM 7.6* 8.1* 7.9* 7.6* 8.0*  PHOS 2.5 1.3* 1.9* 2.9 2.4*    Studies: No results found.  Scheduled Meds: . cyclobenzaprine  10 mg Oral TID  . feeding supplement (KATE [employer]ARMS STANDARD 1.4)  325 mL Oral BID BM  . fentaNYL  1 patch Transdermal Q72H  . heparin injection (subcutaneous)  5,000 Units Subcutaneous Q8H  . insulin aspart  0-5 Units Subcutaneous TID WC  . insulin aspart  0-5 Units Subcutaneous QHS  . latanoprost  1 drop Both Eyes QHS  . mirtazapine  15 mg Oral QHS  . multivitamin with minerals  1 tablet Oral Daily  . senna-docusate  1 tablet Oral BID   Continuous Infusions: . lactated ringers 75 mL/hr at 07/09/20 1231   PRN Meds: acetaminophen **OR** acetaminophen, ALPRAZolam, bisacodyl, HYDROmorphone (DILAUDID) injection, labetalol, LORazepam, ondansetron **OR** ondansetron (ZOFRAN) IV, oxyCODONE-acetaminophen **AND** oxyCODONE  Time spent: 35 minutes  Author: Berle Mull, MD [facility] 07/09/2020 6:07 PM  To reach On-call, see care teams to locate the attending and reach out via [email or web address]. Between 7PM-7AM, please contact night-coverage If you still have difficulty reaching the attending  provider, please page the [facility] (Director on Call) for [facility] on amion for assistance.

## 2020-07-09 NOTE — Progress Notes (Signed)
Initial Nutrition Assessment  DOCUMENTATION CODES:   Not applicable  INTERVENTION:  - will order [employer] BID, each supplement provides 455 kcal and 20 grams protein. - will order Magic Cup with lunch meals, each supplement provides 290 kcal and 9 grams of protein. - will order 1 tablet multivitamin with minerals/day. - will complete NFPE at follow-up.  NUTRITION DIAGNOSIS:   Increased nutrient needs related to acute illness, chronic illness, cancer and cancer related treatments as evidenced by estimated needs.  GOAL:   Patient will meet greater than or equal to 90% of their needs  MONITOR:   PO intake, Supplement acceptance, Labs, Weight trends  REASON FOR ASSESSMENT:   Consult Assessment of nutrition requirement/status  ASSESSMENT:   69 y.o. female with medical history of type 2 DM, multiple myeloma, glaucoma, herniated lumbar disc, erythropoietin deficiency anemia, and anxiety. She presented to the ED due to progressively worsening chronic L flank, LUQ and L-mid back area pain since Friday (7/9).  Patient has had multiple instances of being NPO since admission. The most recently documented intake of meals was 100% of lunch and 100% of dinner on 7/17; 20% of lunch 7/18.  She is currently out of the room to Radiation Oncology/XRT. Unable to complete NFPE or obtain nutrition-related information. Patient was last seen by a [place] RD on 03/21/20. At that time, patient had poor appetite and persistent N/V.   Weight on 7/13 was 152 lb and weight on 5/21 was 159 lb. This indicates 7 lb weight loss (4.4% body weight) in ~2 months; not significant for time frame.   Per notes: - L-sided hydronephrosis d/t tumor obstructing L ureter--s/p stent placement and cystoscopy 7/14 - chronic anemia - AKI d/t dehydration--improving - severe constipation - Palliative Care has seen patient and she is DNR/DNI   Labs reviewed; CBGs: 155 and 123 mg/dl, BUN: 7 mg/dl, Phos: 2.4  mg/dl. Medications reviewed; sliding scale novolog, 40 mEq Klor-Con x2 doses 7/18, 1 tablet senokot BID.  IVF; LR @ 75 ml/hr.    NUTRITION - FOCUSED PHYSICAL EXAM:  unable to complete at this time.   Diet Order:   Diet Order            Diet regular Room service appropriate? Yes; Fluid consistency: Thin  Diet effective now                 EDUCATION NEEDS:   No education needs have been identified at this time  Skin:  Skin Assessment: Reviewed RN Assessment  Last BM:  7/18  Height:   Ht Readings from Last 1 Encounters:  07/03/20 5' 2"  (1.575 m)    Weight:   Wt Readings from Last 1 Encounters:  07/03/20 69.1 kg     Estimated Nutritional Needs:  Kcal:  1900-2100 kcal Protein:  95-105 grams Fluid:  >/= 2.1 L/day     Jarome Matin, MS, RD, LDN, CNSC Inpatient Clinical Dietitian RD pager # available in AM[employer]  After hours/weekend pager # available in [facility]

## 2020-07-09 NOTE — Consult Note (Signed)
Referral MD  Reason for Referral: Progressive IgG kappa myeloma-worsening pain secondary to extra medullary plasmacytoma was  Chief Complaint  Patient presents with  . Flank Pain  : I would have been a lot of pain on my left side.  HPI: Vicki Moon is well-known to me.  She is a very nice 69 year old white female.  She has a very aggressive IgG kappa myeloma.  She has been on multiple courses of chemotherapy.  She has had radiation therapy.  She has been set up to start the newly approved Melflufen last week.    She actually was on vacation couple weeks ago.  She was at the beach with her family.  First part of the vacation was nice.  Second part however she began to have more in the way of pain..  She has had radiation therapy recently.  She had radiation therapy to the right shoulder and right hip area.  This seemed to help quite a bit.  She had large plasmacytomas.  She now was having worsening pain over on the left side.  She was admitted for pain control.  She has not yet started radiation therapy.  She lives in [place].  She been getting her radiation therapy up in [place].  This is only 5 minutes from her house.  She would really like to have radiation up in [place] if possible.  She is currently on a Duragesic patch at 50 mcg.  She is getting oral medication IV medication.  She is not having any cough.  Is no fever.  She is not have any problem with these staph abscesses that she has had in the past.  Her diabetes seems to be doing fairly well.  I think she has lost quite a bit of weight.  She has been seen by palliative care.  As always, they have done a fantastic job with her.  Para she is undergone a stent placement into the left ureter.  This was done on 07/04/2020.  Her labs today show a BUN of 7 creatinine 0.82.  Blood sugar is 156.  Her albumin is 2.7.  Her white cell count is 2.5.  Hemoglobin 9.9.  Platelet count 148,000.  She has had no problems with urinating.  She has had a  little bit of constipation but takes a laxative.  There is no swelling in her legs.  She is able to walk a little bit.  Overall, I would say her performance status is probably ECOG 2.    Past Medical History:  Diagnosis Date  . Diabetes mellitus without complication (HCC)    Type 2 IDDM x 5 years  . 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 Glaucoma   . Goals of care, counseling/discussion 04/29/2019  . Herniated lumbar intervertebral disc 06/2014  . Multiple myeloma ([place]) 04/29/2019  . PONV (postoperative nausea and vomiting)   :  Past Surgical History:  Procedure Laterality Date  . CHOLECYSTECTOMY    . COLONOSCOPY N/A 06/28/2015   Procedure: COLONOSCOPY;  Surgeon: Rogene Houston, MD;  Location: [facility];  Service: Endoscopy;  Laterality: N/A;  155  . CYSTOSCOPY/URETEROSCOPY/HOLMIUM LASER/STENT PLACEMENT Left 07/04/2020   Procedure: CYSTOSCOPY/URETEROSCOP/STENT PLACEMENT left retrograde pylegram;  Surgeon: Raynelle Bring, MD;  Location: [facility];  Service: Urology;  Laterality: Left;  . EYE SURGERY    . IR BONE MARROW BIOPSY & ASPIRATION  03/16/2020  . IR BONE TUMOR(S)RF ABLATION  04/28/2019  . IR KYPHO LUMBAR INC FX REDUCE BONE BX UNI/BIL CANNULATION INC/IMAGING  04/28/2019  . IR RADIOLOGIST EVAL & MGMT  04/22/2019  . LUMBAR LAMINECTOMY Right 07/12/2014   Procedure: LUMBAR FIVE TO SACRAL ONE MICRODISCECTOMY;  Surgeon: Marybelle Killings, MD;  Location: [facility];  Service: Orthopedics;  Laterality: Right;  . TUBAL LIGATION    . VITRECTOMY 25 GAUGE WITH SCLERAL BUCKLE Left 07/29/2017   Procedure: RETINAL DETACHMENT REPAIR LEFT EYE WITH PARSPLANA VITRECTOMY, AIR FLUID EXCHANGE, ENDO LASTER, DRAINAGE OF SUBRETINAL FLUID,ENDOLASER PPV /25 GAUGE;  Surgeon: Jalene Mullet, MD;  Location: [facility];  Service: Ophthalmology;  Laterality: Left;  :   Current Facility-Administered Medications:  .  acetaminophen (TYLENOL) tablet 650 mg, 650 mg, Oral, Q6H PRN, 650 mg at 07/05/20 2206 **OR** acetaminophen  (TYLENOL) suppository 650 mg, 650 mg, Rectal, Q6H PRN, Raynelle Bring, MD .  ALPRAZolam Duanne Moron) tablet 0.5 mg, 0.5 mg, Oral, QHS PRN, Raynelle Bring, MD .  bisacodyl (DULCOLAX) suppository 10 mg, 10 mg, Rectal, Daily PRN, Lavina Hamman, MD .  cyclobenzaprine (FLEXERIL) tablet 10 mg, 10 mg, Oral, TID, Lavina Hamman, MD, 10 mg at 07/08/20 2158 .  feeding supplement (GLUCERNA SHAKE) (GLUCERNA SHAKE) liquid 237 mL, 237 mL, Oral, TID BM, Lavina Hamman, MD, 237 mL at 07/07/20 2135 .  fentaNYL (DURAGESIC) 50 MCG/HR 1 patch, 1 patch, Transdermal, Q72H, Basilio Cairo, NP, 1 patch at 07/06/20 1513 .  heparin injection 5,000 Units, 5,000 Units, Subcutaneous, Q8H, Raynelle Bring, MD, 5,000 Units at 07/09/20 0603 .  HYDROmorphone (DILAUDID) injection 1 mg, 1 mg, Intravenous, Q3H PRN, Raynelle Bring, MD, 1 mg at 07/08/20 2158 .  insulin aspart (novoLOG) injection 0-5 Units, 0-5 Units, Subcutaneous, TID WC, Raynelle Bring, MD .  insulin aspart (novoLOG) injection 0-5 Units, 0-5 Units, Subcutaneous, QHS, Raynelle Bring, MD .  labetalol (NORMODYNE) injection 10 mg, 10 mg, Intravenous, Q4H PRN, Raynelle Bring, MD .  lactated ringers infusion, , Intravenous, Continuous, Raynelle Bring, MD, Last Rate: 75 mL/hr at 07/09/20 6789, Restarted at 07/09/20 0609 .  latanoprost (XALATAN) 0.005 % ophthalmic solution 1 drop, 1 drop, Both Eyes, QHS, Raynelle Bring, MD, 1 drop at 07/08/20 2200 .  LORazepam (ATIVAN) injection 1 mg, 1 mg, Intravenous, Q6H PRN, Raynelle Bring, MD .  mirtazapine (REMERON) tablet 15 mg, 15 mg, Oral, QHS, Lavina Hamman, MD, 15 mg at 07/08/20 2158 .  ondansetron (ZOFRAN) tablet 4 mg, 4 mg, Oral, Q6H PRN **OR** ondansetron (ZOFRAN) injection 4 mg, 4 mg, Intravenous, Q6H PRN, Raynelle Bring, MD, 4 mg at 07/03/20 1053 .  oxyCODONE-acetaminophen (PERCOCET/ROXICET) 5-325 MG per tablet 1 tablet, 1 tablet, Oral, Q4H PRN, 1 tablet at 07/09/20 0608 **AND** oxyCODONE (Oxy IR/ROXICODONE) immediate release  tablet 5 mg, 5 mg, Oral, Q4H PRN, Lavina Hamman, MD, 5 mg at 07/09/20 3810 .  senna-docusate (Senokot-S) tablet 1 tablet, 1 tablet, Oral, BID, Lavina Hamman, MD, 1 tablet at 07/08/20 2158:  . cyclobenzaprine  10 mg Oral TID  . feeding supplement (GLUCERNA SHAKE)  237 mL Oral TID BM  . fentaNYL  1 patch Transdermal Q72H  . heparin injection (subcutaneous)  5,000 Units Subcutaneous Q8H  . insulin aspart  0-5 Units Subcutaneous TID WC  . insulin aspart  0-5 Units Subcutaneous QHS  . latanoprost  1 drop Both Eyes QHS  . mirtazapine  15 mg Oral QHS  . senna-docusate  1 tablet Oral BID  :  Allergies  Allergen Reactions  . Metformin Diarrhea and Nausea Only  :  Family History  Problem Relation Age of Onset  .  Hypertension Mother   . Stroke Mother   . Cancer Father        brain tumor  . Diabetes Father   :  Social History   Socioeconomic History  . Marital status: Married    Spouse name: Not on file  . Number of children: Not on file  . Years of education: Not on file  . Highest education level: Not on file  Occupational History  . Not on file  Tobacco Use  . Smoking status: Current Some Day Smoker    Packs/day: 1.00    Years: 10.00    Pack years: 10.00    Types: Cigarettes    Last attempt to quit: 02/20/2019    Years since quitting: 1.3  . Smokeless tobacco: Never Used  . Tobacco comment: 1 pack/month   Vaping Use  . Vaping Use: Never used  Substance and Sexual Activity  . Alcohol use: No    Alcohol/week: 0.0 standard drinks  . Drug use: No  . Sexual activity: Yes  Other Topics Concern  . Not on file  Social History Narrative  . Not on file   Social Determinants of Health   Financial Resource Strain:   . Difficulty of Paying Living Expenses:   Food Insecurity:   . Worried About Charity fundraiser in the Last Year:   . Arboriculturist in the Last Year:   Transportation Needs:   . Film/video editor (Medical):   Marland Kitchen Lack of Transportation  (Non-Medical):   Physical Activity:   . Days of Exercise per Week:   . Minutes of Exercise per Session:   Stress:   . Feeling of Stress :   Social Connections:   . Frequency of Communication with Friends and Family:   . Frequency of Social Gatherings with Friends and Family:   . Attends Religious Services:   . Active Member of Clubs or Organizations:   . Attends Archivist Meetings:   Marland Kitchen Marital Status:   Intimate Partner Violence:   . Fear of Current or Ex-Partner:   . Emotionally Abused:   Marland Kitchen Physically Abused:   . Sexually Abused:   :  Review of Systems  Constitutional: Positive for weight loss.  HENT: Negative.   Eyes: Negative.   Respiratory: Negative.   Cardiovascular: Negative.   Gastrointestinal: Positive for constipation and nausea. Negative for vomiting.  Genitourinary: Positive for flank pain.  Musculoskeletal: Positive for joint pain and myalgias.  Skin: Negative.   Neurological: Negative.   Endo/Heme/Allergies: Negative.   Psychiatric/Behavioral: Negative.      Exam:  Physical Exam Vitals reviewed.  HENT:     Head: Normocephalic and atraumatic.  Eyes:     Pupils: Pupils are equal, round, and reactive to light.  Cardiovascular:     Rate and Rhythm: Normal rate and regular rhythm.     Heart sounds: Normal heart sounds.  Pulmonary:     Effort: Pulmonary effort is normal.     Breath sounds: Normal breath sounds.  Abdominal:     General: Bowel sounds are normal.     Palpations: Abdomen is soft.  Musculoskeletal:        General: No tenderness or deformity. Normal range of motion.     Cervical back: Normal range of motion.  Lymphadenopathy:     Cervical: No cervical adenopathy.  Skin:    General: Skin is warm and dry.     Findings: No erythema or rash.  Neurological:  Mental Status: She is alert and oriented to person, place, and time.  Psychiatric:        Behavior: Behavior normal.        Thought Content: Thought content normal.         Judgment: Judgment normal.    Patient Vitals for the past 24 hrs:  BP Temp Temp src Pulse Resp SpO2  07/09/20 0533 (!) 159/66 97.8 F (36.6 C) Oral 81 16 98 %  07/08/20 2238 (!) 145/56 98.4 F (36.9 C) Oral 73 16 97 %  07/08/20 1428 (!) 128/47 98.1 F (36.7 C) Oral 81 18 96 %     Recent Labs    07/08/20 0715 07/09/20 0623  WBC 2.7* 2.5*  HGB 9.5* 9.9*  HCT 29.0* 30.2*  PLT 156 148*   Recent Labs    07/08/20 0715 07/09/20 0623  NA 140 138  K 3.3* 3.5  CL 105 105  CO2 25 24  GLUCOSE 141* 156*  BUN 8 7*  CREATININE 0.98 0.82  CALCIUM 7.6* 8.0*    Blood smear review: None  Pathology: None    Assessment and Plan: Vicki Moon is a 69 year old white female with what I would certainly consider refractory myeloma.  She has IgG kappa myeloma.  She has been through multiple courses of chemotherapy.  We really had 1 option left.  She was to get this last week but unfortunately was admitted to the hospital.  I know that radiation therapy does work.  It is helped the right shoulder and right hip area.  I am sure that it will help with the pain over in the left side.  Again, she lives in [place], [place].  She has gotten radiation therapy up to the cancer center in [place].  This would be so much more convenient for her if this can be arranged.  Hopefully, we can still consider Melflufen for her.  I appreciate the help from palliative care.  Pain control will certainly be a big issue and our primary goal.  I totally agree with the DNR status.  I know that she is try her best.  She has done everything we have asked her to do.  She just has a very aggressive myeloma.  I am sure that the chromosomal abnormalities that she has with her myeloma are not helping.  I very much appreciate everybody's help with Vicki Moon.  I know that she has gotten fantastic care from all the staff up on [address].  Lattie Haw, MD  Psalms 105:4-5

## 2020-07-09 NOTE — Care Management Important Message (Signed)
Important Message  Patient Details IM Letter given to [name] RN Case Manager to present to the Patient Name: Vicki Moon MRN: [id number] Date of Birth: [birth year]   Medicare Important Message Given:  Yes     Kerin Salen 07/09/2020, 10:36 AM

## 2020-07-09 NOTE — Progress Notes (Signed)
Received patient in nursing clinic from [facility]. Patient reports severe pain when she attempts to lay flat for treatment. Adline Peals, RN floor nurse, presented at [phone] and medicated the patient with Dilaudid. After receiving medication patient was able to tolerate laying flat for radiation therapy.

## 2020-07-09 NOTE — Progress Notes (Signed)
Physical Therapy Treatment Patient Details Name: Vicki Moon MRN: [id number] DOB: 1951-10-30 Today's Date: 07/09/2020    History of Present Illness 69 y.o. female with medical history significant of type 2 diabetes, multiple myeloma undergoing radiation therapy, glaucoma, herniated lumbar disc, erythropoietin deficiency anemia, anxiety  admitted on 07/03/2020 with worsening abdominal pain with imaging finding of severe progression of multiple myeloma since May 2021 with aortic invasion and underlying diffuse skeletal myelomatous involvement and possible left-sided hydronephrosis due to tumor obstructing the distal left ureter, cannot rule out left ureteral calculus. s/p L ureteroscopy and stent placement on 7/14.    PT Comments    Progressing with mobility.    Follow Up Recommendations  Home health PT;Supervision for mobility/OOB     Equipment Recommendations   (tub bench)    Recommendations for Other Services       Precautions / Restrictions Precautions Precautions: Fall Restrictions Weight Bearing Restrictions: No    Mobility  Bed Mobility Overal bed mobility: Modified Independent             General bed mobility comments: for safety, increased time and use of bedrails to perform.  Transfers Overall transfer level: Needs assistance Equipment used: Rolling walker (2 wheeled) Transfers: Sit to/from Stand Sit to Stand: Min guard         General transfer comment: Increased time. VCs safety, hand placement  Ambulation/Gait Ambulation/Gait assistance: Min guard Gait Distance (Feet): 75 Feet Assistive device: Rolling walker (2 wheeled) Gait Pattern/deviations: Step-through pattern;Decreased stride length;Trunk flexed     General Gait Details: Min guard for safety. Slow gait speed. Near LOB x 1 while attempting to take steps backward with RW.   Stairs             Wheelchair Mobility    Modified Rankin (Stroke Patients Only)       Balance  Overall balance assessment: Needs assistance         Standing balance support: Bilateral upper extremity supported Standing balance-Leahy Scale: Fair                              Cognition Arousal/Alertness: Awake/alert Behavior During Therapy: WFL for tasks assessed/performed Overall Cognitive Status: Within Functional Limits for tasks assessed                                        Exercises      General Comments        Pertinent Vitals/Pain Pain Assessment: 0-10 Pain Score: 5  Pain Location: L flank, R groin Pain Descriptors / Indicators: Discomfort;Sore;Aching Pain Intervention(s): Limited activity within patient's tolerance;Monitored during session    Home Living                      Prior Function            PT Goals (current goals can now be found in the care plan section) Progress towards PT goals: Progressing toward goals    Frequency    Min 3X/week      PT Plan Current plan remains appropriate    Co-evaluation              AM-PAC PT "6 Clicks" Mobility   Outcome Measure  Help needed turning from your back to your side while in a flat bed without using bedrails?: A Little Help  needed moving from lying on your back to sitting on the side of a flat bed without using bedrails?: A Little Help needed moving to and from a bed to a chair (including a wheelchair)?: A Little Help needed standing up from a chair using your arms (e.g., wheelchair or bedside chair)?: A Little Help needed to walk in hospital room?: A Little Help needed climbing 3-5 steps with a railing? : A Little 6 Click Score: 18    End of Session   Activity Tolerance: Patient tolerated treatment well Patient left: in bed;with call bell/phone within reach   PT Visit Diagnosis: Muscle weakness (generalized) (M62.81);Difficulty in walking, not elsewhere classified (R26.2)     Time: 4753-3917 PT Time Calculation (min) (ACUTE ONLY): 10  min  Charges:  $Gait Training: 8-22 mins                        Doreatha Massed, PT Acute Rehabilitation  Office: [phone] Pager: [phone]

## 2020-07-10 ENCOUNTER — Ambulatory Visit
Admit: 2020-07-10 | Discharge: 2020-07-10 | Disposition: A | Discharge status: Home / Self Care | Payer: Medicare Other | Attending: Radiation Oncology | Admitting: Radiation Oncology

## 2020-07-10 LAB — RENAL FUNCTION PANEL
Albumin: 2.4 g/dL — ABNORMAL LOW (ref 3.5–5.0)
Anion gap: 9 (ref 5–15)
BUN: 9 mg/dL (ref 8–23)
CO2: 26 mmol/L (ref 22–32)
Calcium: 7.7 mg/dL — ABNORMAL LOW (ref 8.9–10.3)
Chloride: 102 mmol/L (ref 98–111)
Creatinine, Ser: 0.92 mg/dL (ref 0.44–1.00)
GFR calc Af Amer: >60 mL/min (ref >60)
GFR calc non Af Amer: >60 mL/min (ref >60)
Glucose, Bld: 163 mg/dL — ABNORMAL HIGH (ref 70–99)
Phosphorus: 3.1 mg/dL (ref 2.5–4.6)
Potassium: 3.6 mmol/L (ref 3.5–5.1)
Sodium: 137 mmol/L (ref 135–145)

## 2020-07-10 LAB — CBC WITH DIFFERENTIAL/PLATELET
Abs Immature Granulocytes: 0.02 10*3/uL (ref 0.00–0.07)
Basophils Absolute: 0 10*3/uL (ref 0.0–0.1)
Basophils Relative: 0 %
Eosinophils Absolute: 0 10*3/uL (ref 0.0–0.5)
Eosinophils Relative: 0 %
HCT: 28.5 % — ABNORMAL LOW (ref 36.0–46.0)
Hemoglobin: 9.1 g/dL — ABNORMAL LOW (ref 12.0–15.0)
Immature Granulocytes: 1 %
Lymphocytes Relative: 2 %
Lymphs Abs: 0.1 10*3/uL — ABNORMAL LOW (ref 0.7–4.0)
MCH: 31.6 pg (ref 26.0–34.0)
MCHC: 31.9 g/dL (ref 30.0–36.0)
MCV: 99 fL (ref 80.0–100.0)
Monocytes Absolute: 0.6 10*3/uL (ref 0.1–1.0)
Monocytes Relative: 16 %
Neutro Abs: 2.9 10*3/uL (ref 1.7–7.7)
Neutrophils Relative %: 81 %
Platelets: 126 10*3/uL — ABNORMAL LOW (ref 150–400)
RBC: 2.88 MIL/uL — ABNORMAL LOW (ref 3.87–5.11)
RDW: 15.3 % (ref 11.5–15.5)
WBC: 3.6 10*3/uL — ABNORMAL LOW (ref 4.0–10.5)
nRBC: 0 % (ref 0.0–0.2)

## 2020-07-10 LAB — GLUCOSE, CAPILLARY
Glucose-Capillary: 155 mg/dL — ABNORMAL HIGH (ref 70–99)
Glucose-Capillary: 148 mg/dL — ABNORMAL HIGH (ref 70–99)
Glucose-Capillary: 135 mg/dL — ABNORMAL HIGH (ref 70–99)
Glucose-Capillary: 139 mg/dL — ABNORMAL HIGH (ref 70–99)

## 2020-07-10 MED ORDER — FENTANYL 75 MCG/HR TD PT72
1.0000 | MEDICATED_PATCH | TRANSDERMAL | Status: DC
  Administered 2020-07-10 – 2020-07-13 (×2): 1 via TRANSDERMAL
  Filled 2020-07-10 (×3): qty 1

## 2020-07-10 MED ORDER — PANTOPRAZOLE SODIUM 40 MG PO TBEC
40.0000 mg | DELAYED_RELEASE_TABLET | Freq: Two times a day (BID) | ORAL | Status: DC
  Administered 2020-07-10 – 2020-07-13 (×7): 40 mg via ORAL
  Filled 2020-07-10 (×7): qty 1

## 2020-07-10 MED ORDER — HYDROMORPHONE HCL 2 MG/ML IJ SOLN
3.0000 mg | INTRAMUSCULAR | Status: DC | PRN
  Administered 2020-07-10 (×3): 3 mg via INTRAVENOUS
  Administered 2020-07-10: 2 mg via INTRAVENOUS
  Administered 2020-07-11 – 2020-07-13 (×8): 3 mg via INTRAVENOUS
  Filled 2020-07-10 (×12): qty 2

## 2020-07-10 NOTE — Progress Notes (Signed)
Physical Therapy Treatment Patient Details Name: Vicki Moon MRN: [id number] DOB: October 08, 1951 Today's Date: 07/10/2020    History of Present Illness 69 y.o. female with medical history significant of type 2 diabetes, multiple myeloma undergoing radiation therapy, glaucoma, herniated lumbar disc, erythropoietin deficiency anemia, anxiety  admitted on 07/03/2020 with worsening abdominal pain with imaging finding of severe progression of multiple myeloma since May 2021 with aortic invasion and underlying diffuse skeletal myelomatous involvement and possible left-sided hydronephrosis due to tumor obstructing the distal left ureter, cannot rule out left ureteral calculus. s/p L ureteroscopy and stent placement on 7/14.    PT Comments    Patient progressing well with mobility and denied pain today after returning from radiation tx. She was able to complete multiple sit<>stands from EOB and recliner for functional LE strengthening today and advanced gait distance to progress activity tolerance. Pt continues to require intermittent assist with power up to stand and with walker management during gait. She will continue to benefit from skilled PT interventions to address impairments and progress mobility as able. Recommend HHPT follow up.    Follow Up Recommendations  Home health PT;Supervision for mobility/OOB     Equipment Recommendations  Other (comment) (tub bench)    Recommendations for Other Services       Precautions / Restrictions Precautions Precautions: Fall Precaution Comments: Pain in Rt groin with amb Restrictions Weight Bearing Restrictions: No    Mobility  Bed Mobility Overal bed mobility: Modified Independent Bed Mobility: Supine to Sit     Supine to sit: HOB elevated;Modified independent (Device/Increase time)     General bed mobility comments: pt requires HOB elevated and use of bed rails.  Transfers Overall transfer level: Needs assistance Equipment used:  Rolling walker (2 wheeled) Transfers: Sit to/from Stand Sit to Stand: Min assist;Min guard         General transfer comment: pt requires repeated VCs for safe hand placement, min assit for power up from EOB. repeated sit<>stands for strengthening from recliner.   Ambulation/Gait Ambulation/Gait assistance: Min assist;Min guard Gait Distance (Feet): 180 Feet Assistive device: Rolling walker (2 wheeled) Gait Pattern/deviations: Step-through pattern;Decreased stride length;Trunk flexed Gait velocity: decr   General Gait Details: pt with slow cautious gait speed. no overt LOB noted. pt required intermittent cues for safety with RW and assist to manage with turns. trunk slightly flexed and cues required for posture and to look up for safety with navigation.    Stairs             Wheelchair Mobility    Modified Rankin (Stroke Patients Only)       Balance Overall balance assessment: Needs assistance Sitting-balance support: No upper extremity supported;Feet supported Sitting balance-Leahy Scale: Good     Standing balance support: During functional activity;Bilateral upper extremity supported Standing balance-Leahy Scale: Fair Standing balance comment: able to stand statically without external support, requires RW for ambulation               Cognition Arousal/Alertness: Awake/alert Behavior During Therapy: WFL for tasks assessed/performed Overall Cognitive Status: Within Functional Limits for tasks assessed             Exercises Other Exercises Other Exercises: 1x5 reps sit<>stand without UE use for power up; 1x5 reps with singel UE use for power up. min assist to steady throughout.    General Comments        Pertinent Vitals/Pain Pain Assessment: No/denies pain           PT Goals (  current goals can now be found in the care plan section) Acute Rehab PT Goals PT Goal Formulation: With patient Time For Goal Achievement: 07/21/20 Potential to Achieve  Goals: Good Progress towards PT goals: Progressing toward goals    Frequency    Min 3X/week      PT Plan Current plan remains appropriate       AM-PAC PT "6 Clicks" Mobility   Outcome Measure  Help needed turning from your back to your side while in a flat bed without using bedrails?: None Help needed moving from lying on your back to sitting on the side of a flat bed without using bedrails?: None Help needed moving to and from a bed to a chair (including a wheelchair)?: A Little Help needed standing up from a chair using your arms (e.g., wheelchair or bedside chair)?: A Little Help needed to walk in hospital room?: A Little Help needed climbing 3-5 steps with a railing? : A Little 6 Click Score: 20    End of Session Equipment Utilized During Treatment: Gait belt Activity Tolerance: Patient tolerated treatment well Patient left: in chair;with call bell/phone within reach;with family/visitor present Nurse Communication: Mobility status PT Visit Diagnosis: Muscle weakness (generalized) (M62.81);Difficulty in walking, not elsewhere classified (R26.2)     Time: 1275-1700 PT Time Calculation (min) (ACUTE ONLY): 18 min  Charges:  $Therapeutic Exercise: 8-22 mins                    Verner Mould, DPT Acute Rehabilitation Services  Office [phone] Pager [phone]  07/10/2020 4:53 PM

## 2020-07-10 NOTE — Progress Notes (Signed)
Unfortunately, Vicki Moon had a tough time yesterday.  She started her radiation.  She really had a lot of problems with radiation with respect to the pain.  She has had that when she lies down flat, she has quite a bit of pain over on the left side.  When she sits up, the pain is much better.  She and I had a long talk this morning.  She wanted to know how much longer she had.  I told her that I thought that she might have another 2-3 months.  I think a lot will depend on whether or not she handles the radiation.  I really think that we are going to have to get Hospice for her once she gets home.  I just do not think that she is going to be a candidate for any additional systemic therapy.  Her performance status is declining.  We will again have to focus on her pain.  This should be our primary goal.  I will increase her Duragesic patch up to 75 mcg.  I will have to increase her hydromorphone to 3 mg IV.  She will need to have a dose once she goes down to radiation therapy.  This might hopefully get some of the pain better for her.  She is not eating all that much.  She is having abdominal pain.  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  I will put her on some Protonix to see if this can help.  Her labs show a white cell count 3.6.  Hemoglobin 9.1.  Platelet count 126,000.  Her BUN is 9 creatinine 0.92.  Her calcium is 7.7.  Albumin is 2.4.  She is not having any bleeding.  There is no diarrhea.  She says she is not constipated.  Her vital signs all look pretty stable.  Blood pressure is 111/50.  Pulse rate is 81.  Temperature is 98 degrees.  Her lungs sound pretty clear bilaterally.  Cardiac exam regular rate and rhythm.  Abdomen is soft.  Some tenderness in the epigastric area to palpation.  Bowel sounds are present.  She has no fluid wave.  Extremities shows no clubbing, cyanosis or edema.  Neurological exam is nonfocal.  Clearly, Vicki Moon has myeloma that is becoming much more resilient.  She  has always had disease has been very difficult to treat.  Again a lot of this is from the chromosomal abnormalities with her plasma cells.  Our goal clearly is palliation at this point.  I want her to have comfort, respect and dignity.  She deserves this.  I will talk to her about Hospice tomorrow.  I am sure she would be amenable to this.  Hopefully, she will do better with radiation today.  I will think the radiation will take as long since I suspect the did some planning on her yesterday.  Hopefully, given her Dilaudid when she goes down to radiation will help some of the discomfort.  I very much appreciate all the great care that she is getting from all the staff up on [address].  I know the staff is doing a tremendous job with her.  The compassion that the staff shows is not surprising.  Lattie Haw, MD  Psalms 100:5

## 2020-07-10 NOTE — Progress Notes (Signed)
   Daily Progress Note   Patient Name: Vicki Moon       Date: 07/10/2020 DOB: [birth year]  Age: 69 y.o. MRN#: [id number] Attending Physician: Lavina Hamman, MD Primary Care Physician: [name], MD Admit Date: 07/02/2020  Reason for Consultation/Follow-up: Establishing goals of care, Non pain symptom management, Pain control and Psychosocial/spiritual support  Subjective: Patient awake and alert in bed. Complains of abdominal pain. Denies shortness of breath. Reports appetite remains "ok". Patient shares with me her pain and discomfort yesterday during radiation attempts. She reports pain was miserable referring to "I'd rather give birth to triplets naturally". Support provided. We discussed her current pain regimen. She did receive pain medication prior to treatment however, it was not effective requiring additional dosing.   Dr. [name] saw patient today and IV Dilaudid is now increased with request to pre-medicate before treatments. Patient reports relief with current regimen. We discussed her goal of returning home with family whether it be to attempt further chemotherapy or to be kept comfortable under hospice support. This will further be discussed with Dr. [name] tomorrow. Patient expresses she does not want to suffer and is hopeful for comfort no matter the outcome. Support provided.   All questions answered. Nursing staff updated and understand request for premedicating prior to radiation treatments.   Length of Stay: 7 days  Vital Signs: BP (!) 111/50 (BP Location: Left Arm)   Pulse 81   Temp 98 F (36.7 C) (Oral)   Resp 16   Ht 5\' 2"  (1.575 m)   Wt 69.1 kg   SpO2 95%   BMI 27.86 kg/m  SpO2: SpO2: 95 % O2 Device: O2 Device: Room Air O2 Flow Rate: O2 Flow Rate (L/min): 10 L/min  Physical Exam: -awake, A&O x3, chronically-ill appearing, thin -RRR -normal breathing pattern -abdominal tenderness -mood appropriate           Palliative Care Assessment & Plan     Code Status:  DNR  Goals of Care/Recommendations:  Patient is hopeful she will discharge home with family with a goal of further chemotherapy or hospice. Dr. [name] plans to discuss with patient further on tomorrow.   Continues to have pain and intolerance with radiation. Per Dr. [name] IV Dilaudid increased to 3mg  with pre-medication orders prior to radiation. RN aware.   Fentanyl patch increased to 74mcg as ordered.   Per Dr. [name] Percocet/Oxy combo discontinued. We will continue with current IV Dilaudid and Flexeril regimen however, if the goal is to discharge home patient will need to be restarted on oral medication. Would consider Dilaudid po after further evaluating 24hr usage of IV Dilaudid and converting to oral dosing. Will defer to Dr. [name] for further recommendations tomorrow and gladly assist in making sure medication is readily available at discharge.   PMT will continue to support and follow.   Prognosis: POOR 3 months or less.   Discharge Planning: To Be Determined (Home with outpatient Palliative vs. Hospice)  Thank you for allowing the Palliative Medicine Team to assist in the care of this patient.  Time Total: 45 min.   Visit consisted of counseling and education dealing with the complex and emotionally intense issues of symptom management and palliative care in the setting of serious and potentially life-threatening illness.Greater than 50%  of this time was spent counseling and coordinating care related to the above assessment and plan.  Alda Lea, AGPCNP-BC  Palliative Medicine Team [phone]

## 2020-07-10 NOTE — Progress Notes (Signed)
[facility] Progress Note  Patient: Vicki Moon    QIO:[id number]  DOA: 07/02/2020     Date of Service: the patient was seen and examined on 07/10/2020  Brief hospital course: 69 y.o.femalewith medical history significant oftype 2 diabetes, multiple myeloma s/p prior radiation therapy,, glaucoma, herniated lumbar disc, erythropoietin deficiency anemia, anxiety admitted on 07/03/2020 with worsening abdominal pain with imaging finding of severe progression of multiple myeloma since May 2021 with aortic invasion and underlying diffuse skeletal myelomatous involvement and possible left-sided hydronephrosis due to tumor obstructing the distal left ureter, cannot rule out left ureteral calculus.  Currently plan is pain control further goals of care conversation led by oncology.  Assessment and Plan: 1) Multiple myeloma with abdominal pain significantly worse due to  severe progression of multiple myeloma since May 2021 patient previously completed radiation therapy Medical oncology consulted. Also consulted radiation oncology Highly appreciate their assistance.   2) left-sided hydronephrosis Urinary stone due to tumor obstructing the distal left ureter, cannot rule out left ureteral calculus Urology consulted. Appreciate assistance. Underwent stent placement and cystoscopy on 07/04/2020. Renal function improving. Outpatient follow-up with urology now.  3) chronic anemia in the setting of underlying multiple myeloma--H&H appears to be at baseline  4) DM2 controlled without any complication -hold Farxiga, Use Novolog/Humalog Sliding scale insulin with Accu-Cheks/Fingersticks as ordered   5) AKI resolved due to dehydration, cannot exclude possible left-sided hydronephrosis with possible obstructive uropathy contributing creatinine on admission= 1.52 , baseline creatinine = 0.8 to 1.0    , creatinine is now= 1.40 ,  renally adjust medications, avoid nephrotoxic agents /  dehydration  / hypotension Renal function improving.  Monitor.  6) elevated D-dimer in the setting of underlying multiple myeloma Lower extremity Doppler negative.  7) goals of care conversation. Appreciate palliative care assistance. Patient has multiple rounds of treatment for multiple myeloma as well as multiple rounds of radiation and despite which appears to be having progression of her disease. Currently DNR/DNI.  Oncology will discuss about going home with hospice with the patient tomorrow. Outpatient palliative care referral.  8)  Pain control.  Still ongoing Appreciate palliative care assistance. Patient will be started on fentanyl patch and continue Percocet. Continue as needed Dilaudid for severe pain. Stool softener also ordered. Patient reported some chest pain earlier which is currently resolved.  Due to complaints of chest pain, and prior evidence of aortic involvement of the tumor a CTA aorta was performed. No evidence of dissection. Patient continues to report abdominal pain needing IV Dilaudid. Medication had been adjusted by oncology and palliative care today again on 07/10/2020. Monitor response. Concern is that the patient's pain is so intense that she may require doses of medication that would ultimately cause delirium. Suspect that the patient will benefit from residential hospice rather than home hospice given her severe pain requirement.  9) severe constipation. Corrected with lactulose Currently we will continue bowel regimen.  10)  Poor p.o. intake. Add Remeron. Glucerna. Change diet to regular diet. Tolerating well.  11) hypokalemia, hypophosphatemia. Corrected with K-Phos.  Diet: Cardiac diet DVT Prophylaxis:   heparin injection 5,000 Units Start: 07/04/20 1400 SCDs Start: 07/03/20 0248   Advance goals of care discussion: Full code  Family Communication: No family was present at bedside, at the time of interview.   Disposition:  Status is:  Inpatient  Remains inpatient appropriate because: Still in considerable pain requiring IV pain medication.  Still poor p.o. intake.   Dispo: The patient is from: Home  Anticipated d/c is to: Home with home health              Anticipated d/c date is: Tomorrow 07/10/2020              Patient currently is not medically stable to d/c.  Subjective: Continues to report pain.  No fever no chills.  No chest pain but reports severe abdome despite receiving Dilaudid.  Feels sleepy after receiving Dilaudid.  Physical Exam:  General: Appear in mild distress, no Rash; Oral Mucosa Clear, moist. no Abnormal Neck Mass Or lumps, Conjunctiva normal  Cardiovascular: S1 and S2 Present, no Murmur, Respiratory: good respiratory effort, Bilateral Air entry present and Clear to Auscultation, no Crackles, no wheezes Abdomen: Bowel Sound present, Soft and mild tenderness Extremities: no Pedal edema, no calf tenderness Neurology: alert and oriented to time, place, and person affect appropriate. no new focal deficit Gait not checked due to patient safety concerns   Vitals:   07/09/20 2111 07/10/20 0526 07/10/20 1248 07/10/20 1249  BP: 137/64 (!) 111/50 (!) 124/52   Pulse: 99 81 85   Resp: _0 Temp: 98.2 F (36.8 C) 98 F (36.7 C)  98.3 F (36.8 C)  TempSrc: Oral Oral  Oral  SpO2: 97% 95% 93%   Weight:      Height:        Intake/Output Summary (Last 24 hours) at 07/10/2020 2016 Last data filed at 07/10/2020 1117 Gross per 24 hour  Intake --  Output 1100 ml  Net -1100 ml   Filed Weights   07/02/20 1239 07/03/20 2038  Weight: 70 kg 69.1 kg    Data Reviewed: I have personally reviewed and interpreted daily labs, tele strips, imagings as discussed above. I reviewed all nursing notes, pharmacy notes, vitals, pertinent old records I have discussed plan of care as described above with RN and patient/family.  CBC: Recent Labs  Lab 07/06/20 0532 07/07/20 0622 07/08/20 0715  07/09/20 0623 07/10/20 0606  WBC 2.5* 2.6* 2.7* 2.5* 3.6*  NEUTROABS 1.9 2.0 2.0 1.9 2.9  HGB 9.7* 9.6* 9.5* 9.9* 9.1*  HCT 29.7* 29.1* 29.0* 30.2* 28.5*  MCV 97.4 96.4 95.4 96.2 99.0  PLT 188 154 156 148* 025*   Basic Metabolic Panel: Recent Labs  Lab 07/06/20 0532 07/07/20 0622 07/08/20 0715 07/09/20 0623 07/10/20 0606  NA 137 138 140 138 137  K 3.6 3.2* 3.3* 3.5 3.6  CL 107 106 105 105 102  CO2 _1 GLUCOSE 147* 145* 141* 156* 163*  BUN _2 7* 9  CREATININE 1.00 0.94 0.98 0.82 0.92  CALCIUM 8.1* 7.9* 7.6* 8.0* 7.7*  PHOS 1.3* 1.9* 2.9 2.4* 3.1    Studies: No results found.  Scheduled Meds: . cyclobenzaprine  10 mg Oral TID  . feeding supplement (KATE FARMS STANDARD 1.4)  325 mL Oral BID BM  . fentaNYL  1 patch Transdermal Q72H  . heparin injection (subcutaneous)  5,000 Units Subcutaneous Q8H  . insulin aspart  0-5 Units Subcutaneous TID WC  . insulin aspart  0-5 Units Subcutaneous QHS  . latanoprost  1 drop Both Eyes QHS  . mirtazapine  15 mg Oral QHS  . multivitamin with minerals  1 tablet Oral Daily  . pantoprazole  40 mg Oral BID  . senna-docusate  1 tablet Oral BID   Continuous Infusions: . lactated ringers 75 mL/hr at 07/10/20 0710   PRN Meds: acetaminophen **OR** acetaminophen, bisacodyl, HYDROmorphone (DILAUDID) injection, labetalol,  LORazepam, ondansetron **OR** ondansetron (ZOFRAN) IV  Time spent: 35 minutes  Author: Berle Mull, MD [facility] 07/10/2020 8:16 PM  To reach On-call, see care teams to locate the attending and reach out via [email or web address]. Between 7PM-7AM, please contact night-coverage If you still have difficulty reaching the attending provider, please page the [facility] (Director on Call) for [facility] on amion for assistance.

## 2020-07-10 NOTE — Progress Notes (Signed)
Pt premedicated for scheduled radiation treatment. Pt was initially unsure if she wanted to take, states she still feels loopy but is likely to need it when she arrive for her treatment. Medicated with 2 mg Dilaudid. Advised if additional 1 mg needed, provided my number to transporter and I will administer in radiation if needed. Pt. Assisted to [facility], no distress noted. Neomia Dear, RN

## 2020-07-11 ENCOUNTER — Ambulatory Visit
Admit: 2020-07-11 | Discharge: 2020-07-11 | Disposition: A | Discharge status: Home / Self Care | Payer: Medicare Other | Attending: Radiation Oncology | Admitting: Radiation Oncology

## 2020-07-11 DIAGNOSIS — D61818 Other pancytopenia: Secondary | ICD-10-CM

## 2020-07-11 DIAGNOSIS — I10 Essential (primary) hypertension: Secondary | ICD-10-CM | POA: Diagnosis not present

## 2020-07-11 DIAGNOSIS — E1165 Type 2 diabetes mellitus with hyperglycemia: Secondary | ICD-10-CM | POA: Diagnosis not present

## 2020-07-11 DIAGNOSIS — E119 Type 2 diabetes mellitus without complications: Secondary | ICD-10-CM | POA: Diagnosis not present

## 2020-07-11 DIAGNOSIS — E7849 Other hyperlipidemia: Secondary | ICD-10-CM | POA: Diagnosis not present

## 2020-07-11 DIAGNOSIS — F329 Major depressive disorder, single episode, unspecified: Secondary | ICD-10-CM

## 2020-07-11 DIAGNOSIS — N139 Obstructive and reflux uropathy, unspecified: Secondary | ICD-10-CM

## 2020-07-11 DIAGNOSIS — E876 Hypokalemia: Secondary | ICD-10-CM

## 2020-07-11 DIAGNOSIS — K5900 Constipation, unspecified: Secondary | ICD-10-CM | POA: Diagnosis present

## 2020-07-11 DIAGNOSIS — E1169 Type 2 diabetes mellitus with other specified complication: Secondary | ICD-10-CM | POA: Diagnosis not present

## 2020-07-11 LAB — CBC WITH DIFFERENTIAL/PLATELET
Abs Immature Granulocytes: 0.03 10*3/uL (ref 0.00–0.07)
Basophils Absolute: 0 10*3/uL (ref 0.0–0.1)
Basophils Relative: 0 %
Eosinophils Absolute: 0 10*3/uL (ref 0.0–0.5)
Eosinophils Relative: 0 %
HCT: 29.8 % — ABNORMAL LOW (ref 36.0–46.0)
Hemoglobin: 9.5 g/dL — ABNORMAL LOW (ref 12.0–15.0)
Immature Granulocytes: 1 %
Lymphocytes Relative: 3 %
Lymphs Abs: 0.1 10*3/uL — ABNORMAL LOW (ref 0.7–4.0)
MCH: 32.2 pg (ref 26.0–34.0)
MCHC: 31.9 g/dL (ref 30.0–36.0)
MCV: 101 fL — ABNORMAL HIGH (ref 80.0–100.0)
Monocytes Absolute: 0.5 10*3/uL (ref 0.1–1.0)
Monocytes Relative: 15 %
Neutro Abs: 2.4 10*3/uL (ref 1.7–7.7)
Neutrophils Relative %: 81 %
Platelets: 122 10*3/uL — ABNORMAL LOW (ref 150–400)
RBC: 2.95 MIL/uL — ABNORMAL LOW (ref 3.87–5.11)
RDW: 15.6 % — ABNORMAL HIGH (ref 11.5–15.5)
WBC: 2.9 10*3/uL — ABNORMAL LOW (ref 4.0–10.5)
nRBC: 0 % (ref 0.0–0.2)

## 2020-07-11 LAB — RENAL FUNCTION PANEL
Albumin: 2.6 g/dL — ABNORMAL LOW (ref 3.5–5.0)
Anion gap: 7 (ref 5–15)
BUN: 12 mg/dL (ref 8–23)
CO2: 25 mmol/L (ref 22–32)
Calcium: 7.9 mg/dL — ABNORMAL LOW (ref 8.9–10.3)
Chloride: 104 mmol/L (ref 98–111)
Creatinine, Ser: 0.81 mg/dL (ref 0.44–1.00)
GFR calc Af Amer: >60 mL/min (ref >60)
GFR calc non Af Amer: >60 mL/min (ref >60)
Glucose, Bld: 150 mg/dL — ABNORMAL HIGH (ref 70–99)
Phosphorus: 2.5 mg/dL (ref 2.5–4.6)
Potassium: 4 mmol/L (ref 3.5–5.1)
Sodium: 136 mmol/L (ref 135–145)

## 2020-07-11 LAB — GLUCOSE, CAPILLARY
Glucose-Capillary: 151 mg/dL — ABNORMAL HIGH (ref 70–99)
Glucose-Capillary: 161 mg/dL — ABNORMAL HIGH (ref 70–99)
Glucose-Capillary: 232 mg/dL — ABNORMAL HIGH (ref 70–99)
Glucose-Capillary: 213 mg/dL — ABNORMAL HIGH (ref 70–99)

## 2020-07-11 MED ORDER — POLYETHYLENE GLYCOL 3350 17 G PO PACK: 17.0000 g | PACK | Freq: Every day | ORAL | Status: DC | PRN

## 2020-07-11 MED ORDER — TEMAZEPAM 15 MG PO CAPS: 15.0000 mg | ORAL_CAPSULE | Freq: Every evening | ORAL | Status: DC | PRN

## 2020-07-11 MED ORDER — HYDROMORPHONE HCL 4 MG PO TABS
4.0000 mg | ORAL_TABLET | ORAL | Status: DC | PRN
  Administered 2020-07-11 – 2020-07-13 (×3): 4 mg via ORAL
  Filled 2020-07-11 (×3): qty 1

## 2020-07-11 MED ORDER — PAROXETINE HCL 20 MG PO TABS
30.0000 mg | ORAL_TABLET | Freq: Every morning | ORAL | Status: DC
  Administered 2020-07-12 – 2020-07-13 (×2): 30 mg via ORAL
  Filled 2020-07-11 (×3): qty 1

## 2020-07-11 NOTE — Progress Notes (Signed)
Daily Progress Note   Patient Name: Vicki Moon       Date: 07/11/2020 DOB: [birth year]  Age: 69 y.o. MRN#: [id number] Attending Physician: Eugenie Filler, MD Primary Care Physician: [name], MD Admit Date: 07/02/2020  Reason for Consultation/Follow-up: Establishing goals of care, Non pain symptom management, Pain control and Psychosocial/spiritual support  Subjective: Patient sitting on side of bed. Awake, alert and oriented. Complains of abdominal pain. Continues to have poor appetite. Only taking in several bites throughout the day. Patient reports she has no appetite or desire to eat. Does report she would like a hot fudge sundae however unsure she would actually eat it but plans to stop when she discharges and at least have one. Support given.   Discussed pain regimen. At Dr. [name] recommendations oral Dilaudid has been prescribed. Patient has not taken as of yet due to feelings of nausea. She recently returned from radiation.   Husband is at the bedside speaking over the phone with Vicki Palmer, RN ([place]). Discussion regarding DME needs. Patient and husband requesting shower bench and confirming preferred pharmacy as [facility].   We discuss patient's wishes and discussion with Dr. [name]. Vicki Moon is tearful in discussion expressing his love for his wife, her decline, and his awareness of her poor prognosis. Vicki Moon providing support to her husband expressing to him she is at peace and it will all be ok as long as they are together and continue to love each other and their family during the time she has left. Emotional support provided.   We discuss outpatient hospice care. Patient and husband understands if patient's pain is not able to be controlled consideration for residential hospice may become most appropriate, although I am hopeful patient can remain in her home as she wishes. Husband states they would like services with [facility] versus  [facility] after speaking with a representative of both locations.   All questions answered and support provided.   Length of Stay: 8 days  Vital Signs: BP (!) 144/59 (BP Location: Left Arm)   Pulse 83   Temp 98.1 F (36.7 C) (Oral)   Resp 17   Ht 5\' 2"  (1.575 m)   Wt 69.1 kg   SpO2 96%   BMI 27.86 kg/m  SpO2: SpO2: 96 % O2 Device: O2 Device: Room Air O2 Flow Rate: O2 Flow Rate (L/min): 10 L/min  Physical Exam: -awake, A&O x3, chronically-ill appearing, thin -RRR -normal breathing pattern -abdominal tenderness -mood appropriate           Palliative Care Assessment & Plan    Code Status:  DNR  Goals of Care/Recommendations:  Patient has decided to discharge home with outpatient hospice after discussions with Dr. [name].  Continue to treat the treatable while hospitalized. Per notations plans to remain in the hospital until last radiation treatment on Friday is complete.   Dilaudid po for pain. Education provided regarding attempting oral medication throughout the day to allow a better assessment of home needs for comfort prior to discharge. Encouraged use of IV medication prior to radiation and for severe pain. Will re-evaluate daily.    PMT will continue to support and follow.   Prognosis: POOR 3 months or less.   Discharge Planning: Home with Hospice   Thank you for allowing the Palliative Medicine Team to assist in the care of this patient.  Time Total: 45 min.   Visit consisted of counseling and education dealing with the complex  and emotionally intense issues of symptom management and palliative care in the setting of serious and potentially life-threatening illness.Greater than 50%  of this time was spent counseling and coordinating care related to the above assessment and plan.  Alda Lea, AGPCNP-BC  Palliative Medicine Team [phone]

## 2020-07-11 NOTE — Progress Notes (Signed)
Looks like Vicki Moon did a little bit better in radiation yesterday.  She felt less discomfort.  Near the end of the radiation she was having some discomfort.  She really would like to take a shower.  I do not see any reason why she could not take a shower.  There has been no problems with her bowels.  Her abdominal discomfort might be a little bit better.  She does not feel as much nauseated.  We did talk about Hospice.  I think this would be a fantastic idea for her when she gets home.  I think Hospice would really help she and her family.  I know she has wonderful support from her family.  Her husband has done a incredible job trying to help her.  I think Hospice would clearly help him out.  She is definitely agreeable to [facility].  They will be available when she goes home.  She has 2 more radiation treatments after today.  She really needs to stay in the hospital for radiation.  It is a huge burden on she and her family to bring her to radiation from [place].  Since it is only 2 more treatments after today, it would be much more convenient and much more tolerable for her to still be in the hospital.  Her labs show white cell count 2.9.  Hemoglobin 9.5.  Platelet count 122,000.  Her BUN is 12 creatinine 0.81.  Her albumin is 2.6.  Calcium 7.9.  Some of her abdominal pain might be from the stent that she had placed last week.  I told her if this was the case, the pain should subside over the next week.  We will put her on some oral Dilaudid.  The IV Dilaudid seems to be helping her.  I think oral Dilaudid was certainly be reasonable.  Again, Vicki Moon is trying hard.  I just think that her body is slowing down and just is not able to tolerate much more.  I just do not see that she is a candidate for any systemic therapy for the myeloma unless she makes a incredible improvement in her overall performance status.  Currently, her performance status is ECOG 3, at  best.  As always, I know the staff up [facility] [address] is doing a fantastic job with her.  She is very appreciative of all the help and the compassion has been shown to her.  Again, she would really appreciate being able to shower.  I think this will make her feel better.  Lattie Haw, MD  Romans 8:38

## 2020-07-11 NOTE — Progress Notes (Signed)
PROGRESS NOTE    Vicki Moon  YHC:[id number] DOB: 1951-01-08 DOA: 07/02/2020 PCP: [name], MD   Chief Complaint  Patient presents with  . Flank Pain    Brief Narrative:  69 y.o.femalewith medical history significant oftype 2 diabetes, multiple myelomas/pprior radiation therapy,, glaucoma, herniated lumbar disc, erythropoietin deficiency anemia, anxietyadmitted on 07/03/2020 with worsening abdominal pain with imaging finding of severe progression of multiple myelomasince May 2021 with aortic invasion and underlying diffuse skeletal myelomatous involvement and possible left-sided hydronephrosis due to tumor obstructing the distal left ureter,cannot rule out left ureteral calculus. Patient status post cystoscopy, left uteroscopy with stone removal/left ureteral stent placement, left retrograde pyelography per Dr. Alinda Money.  Currently plan is pain control further goals of care conversation led by oncology.   Assessment & Plan:   Principal Problem:   Multiple myeloma ([place]) Active Problems:   Essential hypertension, benign   Generalized abdominal pain   AKI (acute kidney injury) ([place])   Anemia   Type 2 diabetes mellitus (HCC)   Depression   Pancytopenia (HCC)   Hypokalemia   Hydronephrosis of left kidney   Elevated d-dimer   Cancer related pain   Palliative care by specialist   Constipation   Hypophosphatemia   Obstructive uropathy  1 progressive multiple myeloma with abdominal pain Patient presented abdominal pain which had significantly worsened likely due to severe progression of multiple myeloma since May 2021 in the setting of hydronephrosis with calculus.  Patient status post urethral stent placement with cystoscopy and stone removal per urology with some improvement with abdominal pain.  Palliative care following with pain management.  Patient getting premedicated prior to radiation treatments.  Patient with ongoing radiation treatments.  Oncology  following and recommending if continued improvement could likely discharge home after patient has completed her radiation treatment with hospice following.  Oncology following and appreciate input and recommendations.  2.  Left-sided hydronephrosis/urinary calculus Left hydronephrosis likely secondary to tumor obstructing distal left ureteral, left ureteral calculus.  Patient status post  post cystoscopy, left uteroscopy with stone removal/left ureteral stent placement, left retrograde pyelography per Dr. Alinda Money (07/04/2020).  Patient with good urine output.  Outpatient follow-up with urology.  3.  Anemia of chronic disease In the setting of multiple myeloma.  Hemoglobin is stable at 9.5.  Follow.  Transfusion threshold hemoglobin<7.  4.  Diabetes mellitus type 2 Last hemoglobin A1c was 7.7 on 03/16/2020.  Repeat hemoglobin A1c in the morning.  CBG of 151 this morning.  Continue to hold farxiga.  Sliding scale insulin.   5.  Pancytopenia Secondary to problem #1.  Currently stable.  Per oncology.  6.  Acute kidney injury Felt likely secondary to prerenal azotemia secondary to dehydration in the setting of left-sided hydronephrosis with possible obstructive uropathy.  Creatinine on admission was 1.52.  Baseline creatinine ranging from 0.8-1.  Renal function improved with hydration and status post cystoscopy with stent placement.  Avoid nephrotoxic agents.  Creatinine currently at 0.81.  Follow.  7.  Elevated D-dimer In the setting of multiple myeloma.  Lower extremity Dopplers negative.  Supportive care.  8.  Cancer related pain Patient has been seen by palliative care who are helping manage patient's pain.  Patient currently on a fentanyl patch and Percocet as needed.  Stool softener ordered.  Patient on IV Dilaudid as needed.  Patient also placed on oral Dilaudid as needed per oncology.  Pain regimen being adjusted by oncology and palliative care.  Oncology recommending hospice on discharge.  Follow.  9.  Severe constipation Improved on lactulose.  Continue Senokot-S twice daily.  MiraLAX as needed.  Continue Dulcolax suppository as needed.  10.  Hypokalemia/hypophosphatemia Potassium repleted.  Potassium of 4.0.  Phosphorus level at 2.5.  Follow.  11.  Depression Resume home regimen Paxil.     DVT prophylaxis: Heparin>> Lovenox Code Status: DNR Family Communication: Updated patient.  No family at bedside. Disposition:   Status is: Inpatient    Dispo:  Patient From: Home  Planned Disposition: Likely home with hospice  Expected discharge date: 07/13/2020  Medically stable for discharge: No.  Patient currently receiving radiation treatments, pain medication being adjusted oncology recommending possible discharge after radiation treatments have been completed.        Consultants:   Urology: Dr. Alinda Money 07/04/2020  Palliative care: Jobe Gibbon, NP 07/05/2020  Radiation/oncology: Worthy Flank, PA 07/05/2020  Oncology: Dr. [name] 07/09/2020  Procedures:   CT chest 07/03/2020  CT abdomen and pelvis 07/03/2020  CT angiogram chest 07/06/2020  Chest x-ray 07/02/2020  Lower extremity Doppler 07/04/2020  Cystoscopy/left uteroscopy and stone removal/left ureteral stent placement/left retrograde pyelography with interpretation per Dr. Alinda Money 07/04/2020  Antimicrobials:   None   Subjective: Patient laying in bed just returning from radiation treatment.  Denies any chest pain or shortness of breath.  Still with chronic abdominal pain however abdominal pain improved with current pain regimen and radiation treatment.on presentation.  Patient premedicated prior to radiation.  Objective: Vitals:   07/10/20 1249 07/10/20 2106 07/11/20 0528 07/11/20 1344  BP:  137/61 (!) 116/55 (!) 144/59  Pulse:  (!) 102 89 83  Resp:  17 17 17   Temp: 98.3 F (36.8 C) 98.8 F (37.1 C) 98.7 F (37.1 C) 98.1 F (36.7 C)  TempSrc: Oral Oral Oral Oral  SpO2:  100%  98% 96%  Weight:      Height:        Intake/Output Summary (Last 24 hours) at 07/11/2020 1712 Last data filed at 07/11/2020 0500 Gross per 24 hour  Intake --  Output 1050 ml  Net -1050 ml   Filed Weights   07/02/20 1239 07/03/20 2038  Weight: 70 kg 69.1 kg    Examination:  General exam: NAD Respiratory system: CTAB.  No wheezes, no crackles, no rhonchi.  Normal respiratory effort.  Speaking in full sentences.  Cardiovascular system: Regular rate rhythm no murmurs rubs or gallops.  No JVD.  No lower extremity edema.  Gastrointestinal system: Abdomen is nondistended, soft and decreased tenderness to palpation.  Positive bowel sounds.  No rebound.  No guarding.  Central nervous system: Alert and oriented. No focal neurological deficits. Extremities: Symmetric 5 x 5 power. Skin: No rashes, lesions or ulcers Psychiatry: Judgement and insight appear normal. Mood & affect appropriate.     Data Reviewed: I have personally reviewed following labs and imaging studies  CBC: Recent Labs  Lab 07/07/20 0622 07/08/20 0715 07/09/20 0623 07/10/20 0606 07/11/20 0539  WBC 2.6* 2.7* 2.5* 3.6* 2.9*  NEUTROABS 2.0 2.0 1.9 2.9 2.4  HGB 9.6* 9.5* 9.9* 9.1* 9.5*  HCT 29.1* 29.0* 30.2* 28.5* 29.8*  MCV 96.4 95.4 96.2 99.0 101.0*  PLT 154 156 148* 126* 122*    Basic Metabolic Panel: Recent Labs  Lab 07/07/20 0622 07/08/20 0715 07/09/20 0623 07/10/20 0606 07/11/20 0539  NA 138 140 138 137 136  K 3.2* 3.3* 3.5 3.6 4.0  CL 106 105 105 102 104  CO2 23 25 24 26 25   GLUCOSE 145* 141* 156* 163*  150*  BUN 9 8 7* 9 12  CREATININE 0.94 0.98 0.82 0.92 0.81  CALCIUM 7.9* 7.6* 8.0* 7.7* 7.9*  PHOS 1.9* 2.9 2.4* 3.1 2.5    GFR: Estimated Creatinine Clearance: 58 mL/min (by C-G formula based on SCr of 0.81 mg/dL).  Liver Function Tests: Recent Labs  Lab 07/07/20 0622 07/08/20 0715 07/09/20 0623 07/10/20 0606 07/11/20 0539  ALBUMIN 2.6* 2.7* 2.7* 2.4* 2.6*    CBG: Recent Labs   Lab 07/10/20 1248 07/10/20 1826 07/10/20 2109 07/11/20 0734 07/11/20 1221  GLUCAP 148* 135* 139* 151* 161*     Recent Results (from the past 240 hour(s))  SARS Coronavirus 2 by RT PCR (hospital order, performed in [facility] hospital lab) Nasopharyngeal Nasopharyngeal Swab     Status: None   Collection Time: 07/03/20 12:51 AM   Specimen: Nasopharyngeal Swab  Result Value Ref Range Status   SARS Coronavirus 2 NEGATIVE NEGATIVE Final    Comment: (NOTE) SARS-CoV-2 target nucleic acids are NOT DETECTED.  The SARS-CoV-2 RNA is generally detectable in upper and lower respiratory specimens during the acute phase of infection. The lowest concentration of SARS-CoV-2 viral copies this assay can detect is 250 copies / mL. A negative result does not preclude SARS-CoV-2 infection and should not be used as the sole basis for treatment or other patient management decisions.  A negative result may occur with improper specimen collection / handling, submission of specimen other than nasopharyngeal swab, presence of viral mutation(s) within the areas targeted by this assay, and inadequate number of viral copies (<250 copies / mL). A negative result must be combined with clinical observations, patient history, and epidemiological information.  Fact Sheet for Patients:   StrictlyIdeas.no  Fact Sheet for Healthcare Providers: BankingDealers.co.za  This test is not yet approved or  cleared by the [place] FDA and has been authorized for detection and/or diagnosis of SARS-CoV-2 by FDA under an Emergency Use Authorization (EUA).  This EUA will remain in effect (meaning this test can be used) for the duration of the COVID-19 declaration under Section 564(b)(1) of the Act, 21 U.S.C. section 360bbb-3(b)(1), unless the authorization is terminated or revoked sooner.  Performed at [facility], [address], [place], [place] [address]   MRSA PCR  Screening     Status: None   Collection Time: 07/04/20  6:07 PM   Specimen: Nasal Mucosa; Nasopharyngeal  Result Value Ref Range Status   MRSA by PCR NEGATIVE NEGATIVE Final    Comment:        The GeneXpert MRSA Assay (FDA approved for NASAL specimens only), is one component of a comprehensive MRSA colonization surveillance program. It is not intended to diagnose MRSA infection nor to guide or monitor treatment for MRSA infections. Performed at [facility], [place] [address] [place], [place] [address]          Radiology Studies: No results found.      Scheduled Meds: . feeding supplement (KATE FARMS STANDARD 1.4)  325 mL Oral BID BM  . fentaNYL  1 patch Transdermal Q72H  . heparin injection (subcutaneous)  5,000 Units Subcutaneous Q8H  . insulin aspart  0-5 Units Subcutaneous TID WC  . insulin aspart  0-5 Units Subcutaneous QHS  . latanoprost  1 drop Both Eyes QHS  . mirtazapine  15 mg Oral QHS  . multivitamin with minerals  1 tablet Oral Daily  . pantoprazole  40 mg Oral BID  . senna-docusate  1 tablet Oral BID   Continuous  Infusions: . lactated ringers 75 mL/hr at 07/10/20 0710     LOS: 8 days    Time spent: 35 minutes    Irine Seal, MD [facility]   To contact the attending provider between 7A-7P or the covering provider during after hours 7P-7A, please log into the web site [email or web address] and access using universal [place] password for that web site. If you do not have the password, please call the hospital operator.  07/11/2020, 5:12 PM

## 2020-07-11 NOTE — Progress Notes (Signed)
Hydrologist [facility]) Hospital Liaison: RN note     Notified by Transition of Care Manger [name], RN of patient/family request for [facility] services at home after discharge. Chart and patient information under review by [facility] physician. Hospice eligibility pending currently.     Writer spoke with  Patient and husband   to initiate education related to hospice philosophy, services and team approach to care.           verbalized understanding of information given.  Please send signed and completed DNR form home with patient/family. Patient will need prescriptions for discharge comfort medications.      DME needs have been discussed, patient currently has the following equipment in the home: none.  Patient/family requests the following DME for delivery to the home: shower bench. [place] equipment manager has been notified and will contact DME provider to arrange delivery to the home. Home address has been verified and is correct in the chart.  Josph Macho is the family member to contact to arrange time of delivery.     [facility] aware of the above. Please notify [facility] when patient is ready to leave the unit at discharge. (Call [phone] or [phone] after 5pm.) [facility] information and contact numbers given to [facility].       Please call with any hospice related questions.      Thank you for this referral.     Farrel Gordon, RN, [facility] (listed on [place] under [place])   [phone]

## 2020-07-11 NOTE — TOC Progression Note (Signed)
Transition of Care (TOC) - Progression Note  ° ° °Patient Details  °Name: Vicki Moon °MRN: [id number] °Date of Birth: 11/29/1951 ° °Transition of Care (TOC) CM/SW Contact  °CLEMENTS, NORA H, RN °Phone Number: °07/11/2020, 2:38 PM ° °Clinical Narrative:    °This CM met with pt at bedside for continued dc planning. TOC was informed that pt now wanted home hospice at dc. This CM had set pt up with home palliative services with [facility]. This CM asked pt if she would like to continue with [facility] for home hospice or use [facility]. Pt states that she would like to continue with [facility]. [facility] contacted for referral.  ° ° °Expected Discharge Plan: Home w Hospice Care °Barriers to Discharge: No Barriers Identified ° °Expected Discharge Plan and Services °Expected Discharge Plan: Home w Hospice Care °  °Discharge Planning Services: CM Consult °  °Living arrangements for the past 2 months: Single Family Home °                °   °HH Arranged: Disease Management °  °  °Social Determinants of Health (SDOH) Interventions °  ° °Readmission Risk Interventions °Readmission Risk Prevention Plan 07/05/2020 05/30/2019  °Transportation Screening Complete Complete  °PCP or Specialist Appt within 3-5 Days Complete Complete  °HRI or Home Care Consult Complete Not Complete  °HRI or Home Care Consult comments - not needed.   °Social Work Consult for Recovery Care Planning/Counseling Not Complete Complete  °SW consult not completed comments NA -  °Palliative Care Screening Complete Complete  °Medication Review (RN Care Manager) Complete Complete  °Some recent data might be hidden  ° °

## 2020-07-12 ENCOUNTER — Ambulatory Visit
Admit: 2020-07-12 | Discharge: 2020-07-12 | Disposition: A | Discharge status: Home / Self Care | Payer: Medicare Other | Attending: Radiation Oncology | Admitting: Radiation Oncology

## 2020-07-12 DIAGNOSIS — C9002 Multiple myeloma in relapse: Secondary | ICD-10-CM

## 2020-07-12 LAB — RENAL FUNCTION PANEL
Albumin: 2.4 g/dL — ABNORMAL LOW (ref 3.5–5.0)
Anion gap: 9 (ref 5–15)
BUN: 8 mg/dL (ref 8–23)
CO2: 25 mmol/L (ref 22–32)
Calcium: 7.8 mg/dL — ABNORMAL LOW (ref 8.9–10.3)
Chloride: 103 mmol/L (ref 98–111)
Creatinine, Ser: 0.8 mg/dL (ref 0.44–1.00)
GFR calc Af Amer: >60 mL/min (ref >60)
GFR calc non Af Amer: >60 mL/min (ref >60)
Glucose, Bld: 140 mg/dL — ABNORMAL HIGH (ref 70–99)
Phosphorus: 2.3 mg/dL — ABNORMAL LOW (ref 2.5–4.6)
Potassium: 3.6 mmol/L (ref 3.5–5.1)
Sodium: 137 mmol/L (ref 135–145)

## 2020-07-12 LAB — CBC WITH DIFFERENTIAL/PLATELET
Abs Immature Granulocytes: 0.02 10*3/uL (ref 0.00–0.07)
Basophils Absolute: 0 10*3/uL (ref 0.0–0.1)
Basophils Relative: 0 %
Eosinophils Absolute: 0 10*3/uL (ref 0.0–0.5)
Eosinophils Relative: 1 %
HCT: 28.6 % — ABNORMAL LOW (ref 36.0–46.0)
Hemoglobin: 8.9 g/dL — ABNORMAL LOW (ref 12.0–15.0)
Immature Granulocytes: 1 %
Lymphocytes Relative: 2 %
Lymphs Abs: 0 10*3/uL — ABNORMAL LOW (ref 0.7–4.0)
MCH: 31.4 pg (ref 26.0–34.0)
MCHC: 31.1 g/dL (ref 30.0–36.0)
MCV: 101.1 fL — ABNORMAL HIGH (ref 80.0–100.0)
Monocytes Absolute: 0.3 10*3/uL (ref 0.1–1.0)
Monocytes Relative: 15 %
Neutro Abs: 1.5 10*3/uL — ABNORMAL LOW (ref 1.7–7.7)
Neutrophils Relative %: 81 %
Platelets: 114 10*3/uL — ABNORMAL LOW (ref 150–400)
RBC: 2.83 MIL/uL — ABNORMAL LOW (ref 3.87–5.11)
RDW: 15.5 % (ref 11.5–15.5)
WBC: 1.8 10*3/uL — ABNORMAL LOW (ref 4.0–10.5)
nRBC: 0 % (ref 0.0–0.2)

## 2020-07-12 LAB — GLUCOSE, CAPILLARY
Glucose-Capillary: 126 mg/dL — ABNORMAL HIGH (ref 70–99)
Glucose-Capillary: 188 mg/dL — ABNORMAL HIGH (ref 70–99)
Glucose-Capillary: 174 mg/dL — ABNORMAL HIGH (ref 70–99)
Glucose-Capillary: 177 mg/dL — ABNORMAL HIGH (ref 70–99)

## 2020-07-12 LAB — MAGNESIUM: Magnesium: 1.6 mg/dL — ABNORMAL LOW (ref 1.7–2.4)

## 2020-07-12 MED ORDER — POTASSIUM CHLORIDE CRYS ER 20 MEQ PO TBCR: 40.0000 meq | EXTENDED_RELEASE_TABLET | Freq: Once | ORAL | Status: DC

## 2020-07-12 MED ORDER — POTASSIUM PHOSPHATES 15 MMOLE/5ML IV SOLN
30.0000 mmol | Freq: Once | INTRAVENOUS | Status: AC
  Administered 2020-07-12: 30 mmol via INTRAVENOUS
  Filled 2020-07-12: qty 10

## 2020-07-12 MED ORDER — MAGNESIUM SULFATE 4 GM/100ML IV SOLN
4.0000 g | Freq: Once | INTRAVENOUS | Status: AC
  Administered 2020-07-12: 4 g via INTRAVENOUS
  Filled 2020-07-12: qty 100

## 2020-07-12 NOTE — Care Management Important Message (Signed)
Important Message  Patient Details IM Letter given to [name] RN Case Manager to present to the Patient Name: Vicki Moon MRN: [id number] Date of Birth: June 12, 1951   Medicare Important Message Given:  Yes     Kerin Salen 07/12/2020, 9:29 AM

## 2020-07-12 NOTE — Progress Notes (Signed)
[facility] Therapy Treatment Record Phone [phone]   Radiation Therapy was administered to Vicki Moon on: 07/12/2020  11:47 AM and was treatment # 4 out of a planned course of 5 treatments.  Radiation Treatment  1). Beam photons with 6-10 energy  2). Brachytherapy None  3). Stereotactic Radiosurgery None  4). Other Radiation None     Alishea Beaudin A Ashlan Dignan, RT (T)

## 2020-07-12 NOTE — Progress Notes (Signed)
Physical Therapy Treatment Patient Details Name: Vicki Moon MRN: [id number] DOB: [birth year] Today's Date: 07/12/2020    History of Present Illness 69 y.o. female with medical history significant of type 2 diabetes, multiple myeloma undergoing radiation therapy, glaucoma, herniated lumbar disc, erythropoietin deficiency anemia, anxiety  admitted on 07/03/2020 with worsening abdominal pain with imaging finding of severe progression of multiple myeloma since May 2021 with aortic invasion and underlying diffuse skeletal myelomatous involvement and possible left-sided hydronephrosis due to tumor obstructing the distal left ureter, cannot rule out left ureteral calculus. s/p L ureteroscopy and stent placement on 7/14.    PT Comments    Pt demonstrating gradual progress.  She does require min guard for safety and minimal cues for transfer and gait technique.  Pt fatigued easily - she had just returned from radiation.  Cont POC.    Follow Up Recommendations  Home health PT;Supervision for mobility/OOB     Equipment Recommendations  None recommended by PT    Recommendations for Other Services       Precautions / Restrictions Precautions Precautions: Fall    Mobility  Bed Mobility   Bed Mobility: Supine to Sit;Sit to Supine     Supine to sit: Supervision Sit to supine: Supervision      Transfers Overall transfer level: Needs assistance Equipment used: Rolling walker (2 wheeled) Transfers: Sit to/from Stand Sit to Stand: Min guard         General transfer comment: Use of momentum and multiple attempts to power up; cues for hand placement  Ambulation/Gait Ambulation/Gait assistance: Min guard Gait Distance (Feet): 250 Feet Assistive device: Rolling walker (2 wheeled) Gait Pattern/deviations: Step-through pattern;Decreased stride length;Trunk flexed     General Gait Details: cues for RW use - tending to drift R ; min guard for safety   Stairs Stairs:  (declined)            Wheelchair Mobility    Modified Rankin (Stroke Patients Only)       Balance Overall balance assessment: Needs assistance Sitting-balance support: No upper extremity supported;Feet supported Sitting balance-Leahy Scale: Good     Standing balance support: During functional activity;Bilateral upper extremity supported Standing balance-Leahy Scale: Fair Standing balance comment: required RW                            Cognition Arousal/Alertness: Awake/alert Behavior During Therapy: WFL for tasks assessed/performed Overall Cognitive Status: Within Functional Limits for tasks assessed                                        Exercises      General Comments General comments (skin integrity, edema, etc.): fatigued easily; just returned from radiation; noted plan to return home with hospice      Pertinent Vitals/Pain Pain Assessment: No/denies pain    Home Living                      Prior Function            PT Goals (current goals can now be found in the care plan section) Acute Rehab PT Goals PT Goal Formulation: With patient Time For Goal Achievement: 07/21/20 Potential to Achieve Goals: Good Progress towards PT goals: Progressing toward goals    Frequency    Min 3X/week      PT Plan Current plan  remains appropriate    Co-evaluation              AM-PAC PT "6 Clicks" Mobility   Outcome Measure  Help needed turning from your back to your side while in a flat bed without using bedrails?: None Help needed moving from lying on your back to sitting on the side of a flat bed without using bedrails?: None Help needed moving to and from a bed to a chair (including a wheelchair)?: None Help needed standing up from a chair using your arms (e.g., wheelchair or bedside chair)?: None Help needed to walk in hospital room?: A Little Help needed climbing 3-5 steps with a railing? : A Little 6 Click Score: 22     End of Session Equipment Utilized During Treatment: Gait belt Activity Tolerance: Patient tolerated treatment well Patient left: with call bell/phone within reach;in bed;with bed alarm set Nurse Communication: Mobility status PT Visit Diagnosis: Muscle weakness (generalized) (M62.81);Difficulty in walking, not elsewhere classified (R26.2)     Time: 0919-8022 PT Time Calculation (min) (ACUTE ONLY): 12 min  Charges:  $Gait Training: 8-22 mins                     Abran Richard, PT Acute Rehab Services Pager [phone] [facility] [place] 07/12/2020, 2:04 PM

## 2020-07-12 NOTE — Progress Notes (Signed)
PROGRESS NOTE    Vicki Moon  WPV:[id number] DOB: 10-28-1951 DOA: 07/02/2020 PCP: [name], MD   Chief Complaint  Patient presents with  . Flank Pain    Brief Narrative:  69 y.o.femalewith medical history significant oftype 2 diabetes, multiple myelomas/pprior radiation therapy,, glaucoma, herniated lumbar disc, erythropoietin deficiency anemia, anxietyadmitted on 07/03/2020 with worsening abdominal pain with imaging finding of severe progression of multiple myelomasince May 2021 with aortic invasion and underlying diffuse skeletal myelomatous involvement and possible left-sided hydronephrosis due to tumor obstructing the distal left ureter,cannot rule out left ureteral calculus. Patient status post cystoscopy, left uteroscopy with stone removal/left ureteral stent placement, left retrograde pyelography per Dr. Alinda Money.  Currently plan is pain control further goals of care conversation led by oncology.   Assessment & Plan:   Principal Problem:   Multiple myeloma ([facility]) Active Problems:   Essential hypertension, benign   Generalized abdominal pain   AKI (acute kidney injury) ([place])   Anemia   Type 2 diabetes mellitus ([facility])   Depression   Pancytopenia (H[facility])   Hypokalemia   Hydronephrosis of left kidney   Elevated d-dimer   Cancer related pain   Palliative care by specialist   Constipation   Hypophosphatemia   Obstructive uropathy  1 progressive multiple myeloma with abdominal pain Patient presented abdominal pain which had significantly worsened likely due to severe progression of multiple myeloma since May 2021 in the setting of hydronephrosis with calculus.  Patient status post urethral stent placement with cystoscopy and stone removal per urology with some improvement with abdominal pain.  Palliative care following with pain management.  Patient getting premedicated prior to radiation treatments.  Patient with ongoing radiation treatments.  Oncology  following and recommending if continued improvement could likely discharge home after patient has completed her radiation treatment with hospice following.  Oncology following and appreciate input and recommendations.  2.  Left-sided hydronephrosis/urinary calculus Left hydronephrosis likely secondary to tumor obstructing distal left ureteral, left ureteral calculus.  Patient status post  post cystoscopy, left uteroscopy with stone removal/left ureteral stent placement, left retrograde pyelography per Dr. Alinda Money (07/04/2020).  Patient with good urine output.  Outpatient follow-up with urology.  3.  Anemia of chronic disease In the setting of multiple myeloma.  Hemoglobin is stable at 8.9.  Follow.  Transfusion threshold hemoglobin<7.  4.  Diabetes mellitus type 2 Last hemoglobin A1c was 7.7 on 03/16/2020.  CBG of 140 on BMET this morning.  Continue to hold farxiga.  Sliding scale insulin.   5.  Pancytopenia Secondary to problem #1.  Currently stable.  Per oncology.  6.  Acute kidney injury Secondary to prerenal azotemia secondary to dehydration in the setting of left sided hydronephrosis with possible obstructive uropathy.  Creatinine on admission was 1.52. Baseline creatinine ranging from 0.8-1.  Renal function improved with hydration and status post cystoscopy with stent placement.  Avoid nephrotoxic agents.  Creatinine currently at 0.80.  Follow.  7.  Elevated D-dimer In the setting of multiple myeloma.  Lower extremity Dopplers negative.  Supportive care.  8.  Cancer related pain Patient has been seen by palliative care who are helping manage patient's pain.  Patient currently on a fentanyl patch and Percocet as needed.  Stool softener ordered.  Patient on IV Dilaudid as needed.  Patient also placed on oral Dilaudid as needed per oncology.  Pain regimen being adjusted by oncology and palliative care.  Oncology recommending hospice on discharge.  Follow.  9.  Severe constipation Improved on  lactulose.  Continue current bowel regimen of MiraLAX as needed, Senokot-S twice daily.  Dulcolax suppository as needed.   10.  Hypokalemia/hypophosphatemia/hypomagnesemia Potassium repleted.  Potassium at 3.6.  Phosphorus at 2.3.  Magnesium at 1.6.  Magnesium sulfate 4 g IV x1.  K-Phos 30 mmol IV x1.  Follow.  11.  Depression Continue Paxil.     DVT prophylaxis: Heparin>> Lovenox Code Status: DNR Family Communication: Updated patient and husband at bedside. Disposition:   Status is: Inpatient    Dispo:  Patient From: Home  Planned Disposition: Likely home with hospice  Expected discharge date: 07/13/2020  Medically stable for discharge: No.  Patient currently receiving radiation treatments, pain medication being adjusted oncology recommending discharge after radiation treatments have been completed.        Consultants:   Urology: Dr. Alinda Money 07/04/2020  Palliative care: Jobe Gibbon, NP 07/05/2020  Radiation/oncology: Worthy Flank, PA 07/05/2020  Oncology: Dr. [name] 07/09/2020  Procedures:   CT chest 07/03/2020  CT abdomen and pelvis 07/03/2020  CT angiogram chest 07/06/2020  Chest x-ray 07/02/2020  Lower extremity Doppler 07/04/2020  Cystoscopy/left uteroscopy and stone removal/left ureteral stent placement/left retrograde pyelography with interpretation per Dr. Alinda Money 07/04/2020  Antimicrobials:   None   Subjective: Patient sitting up in bed just had a shower and states she is feeling better.  Denies chest pain or shortness of breath.  Abdominal pain slowly improving.  Patient for last radiation tomorrow.  Husband at bedside.  Objective: Vitals:   07/11/20 0528 07/11/20 1344 07/11/20 2016 07/12/20 0617  BP: (!) 116/55 (!) 144/59 (!) 129/48 (!) 126/57  Pulse: 89 83 89 81  Resp: _0 Temp: 98.7 F (37.1 C) 98.1 F (36.7 C) 99.2 F (37.3 C) 98 F (36.7 C)  TempSrc: Oral Oral Oral Oral  SpO2: 98% 96% 95% 97%  Weight:        Height:        Intake/Output Summary (Last 24 hours) at 07/12/2020 1318 Last data filed at 07/12/2020 0600 Gross per 24 hour  Intake 1140 ml  Output --  Net 1140 ml   Filed Weights   07/02/20 1239 07/03/20 2038  Weight: 70 kg 69.1 kg    Examination:  General exam: NAD Respiratory system: Lungs clear to auscultation bilaterally.  No wheezes, no crackles, no rhonchi.  Normal respiratory effort.  Speaking in full sentences.  Cardiovascular system: RRR no murmurs rubs or gallops.  No JVD.  No lower extremity edema.  Gastrointestinal system: Abdomen is soft, nontender, nondistended, positive bowel sounds.  No rebound.  No guarding.  Central nervous system: Alert and oriented. No focal neurological deficits. Extremities: Symmetric 5 x 5 power. Skin: No rashes, lesions or ulcers Psychiatry: Judgement and insight appear normal. Mood & affect appropriate.     Data Reviewed: I have personally reviewed following labs and imaging studies  CBC: Recent Labs  Lab 07/08/20 0715 07/09/20 0623 07/10/20 0606 07/11/20 0539 07/12/20 0539  WBC 2.7* 2.5* 3.6* 2.9* 1.8*  NEUTROABS 2.0 1.9 2.9 2.4 1.5*  HGB 9.5* 9.9* 9.1* 9.5* 8.9*  HCT 29.0* 30.2* 28.5* 29.8* 28.6*  MCV 95.4 96.2 99.0 101.0* 101.1*  PLT 156 148* 126* 122* 114*    Basic Metabolic Panel: Recent Labs  Lab 07/08/20 0715 07/09/20 0623 07/10/20 0606 07/11/20 0539 07/12/20 0539  NA 140 138 137 136 137  K 3.3* 3.5 3.6 4.0 3.6  CL 105 105 102 104 103  CO2 _1 GLUCOSE 141* 156*  163* 150* 140*  BUN 8 7* _0 CREATININE 0.98 0.82 0.92 0.81 0.80  CALCIUM 7.6* 8.0* 7.7* 7.9* 7.8*  MG  --   --   --   --  1.6*  PHOS 2.9 2.4* 3.1 2.5 2.3*    GFR: Estimated Creatinine Clearance: 58.8 mL/min (by C-G formula based on SCr of 0.8 mg/dL).  Liver Function Tests: Recent Labs  Lab 07/08/20 0715 07/09/20 0623 07/10/20 0606 07/11/20 0539 07/12/20 0539  ALBUMIN 2.7* 2.7* 2.4* 2.6* 2.4*    CBG: Recent Labs   Lab 07/11/20 1221 07/11/20 1851 07/11/20 2034 07/12/20 0740 07/12/20 1218  GLUCAP 161* 232* 213* 126* 188*     Recent Results (from the past 240 hour(s))  SARS Coronavirus 2 by RT PCR (hospital order, performed in [facility] hospital lab) Nasopharyngeal Nasopharyngeal Swab     Status: None   Collection Time: 07/03/20 12:51 AM   Specimen: Nasopharyngeal Swab  Result Value Ref Range Status   SARS Coronavirus 2 NEGATIVE NEGATIVE Final    Comment: (NOTE) SARS-CoV-2 target nucleic acids are NOT DETECTED.  The SARS-CoV-2 RNA is generally detectable in upper and lower respiratory specimens during the acute phase of infection. The lowest concentration of SARS-CoV-2 viral copies this assay can detect is 250 copies / mL. A negative result does not preclude SARS-CoV-2 infection and should not be used as the sole basis for treatment or other patient management decisions.  A negative result may occur with improper specimen collection / handling, submission of specimen other than nasopharyngeal swab, presence of viral mutation(s) within the areas targeted by this assay, and inadequate number of viral copies (<250 copies / mL). A negative result must be combined with clinical observations, patient history, and epidemiological information.  Fact Sheet for Patients:   StrictlyIdeas.no  Fact Sheet for Healthcare Providers: BankingDealers.co.za  This test is not yet approved or  cleared by the [place] FDA and has been authorized for detection and/or diagnosis of SARS-CoV-2 by FDA under an Emergency Use Authorization (EUA).  This EUA will remain in effect (meaning this test can be used) for the duration of the COVID-19 declaration under Section 564(b)(1) of the Act, 21 U.S.C. section 360bbb-3(b)(1), unless the authorization is terminated or revoked sooner.  Performed at [facility], [address] [place], [place] [address]   MRSA PCR  Screening     Status: None   Collection Time: 07/04/20  6:07 PM   Specimen: Nasal Mucosa; Nasopharyngeal  Result Value Ref Range Status   MRSA by PCR NEGATIVE NEGATIVE Final    Comment:        The GeneXpert MRSA Assay (FDA approved for NASAL specimens only), is one component of a comprehensive MRSA colonization surveillance program. It is not intended to diagnose MRSA infection nor to guide or monitor treatment for MRSA infections. Performed at [facility], [place] [address], [place],  [address]          Radiology Studies: No results found.      Scheduled Meds: . feeding supplement (KATE FARMS STANDARD 1.4)  325 mL Oral BID BM  . fentaNYL  1 patch Transdermal Q72H  . heparin injection (subcutaneous)  5,000 Units Subcutaneous Q8H  . insulin aspart  0-5 Units Subcutaneous TID WC  . insulin aspart  0-5 Units Subcutaneous QHS  . latanoprost  1 drop Both Eyes QHS  . mirtazapine  15 mg Oral QHS  . multivitamin with minerals  1 tablet Oral Daily  .  pantoprazole  40 mg Oral BID  . PARoxetine  30 mg Oral q morning - 10a  . senna-docusate  1 tablet Oral BID   Continuous Infusions: . lactated ringers 50 mL/hr at 07/12/20 0849  . potassium PHOSPHATE IVPB (in mmol) 30 mmol (07/12/20 0854)     LOS: 9 days    Time spent: 35 minutes    Irine Seal, MD [facility]   To contact the attending provider between 7A-7P or the covering provider during after hours 7P-7A, please log into the web site [email or web address] and access using universal [place] password for that web site. If you do not have the password, please call the hospital operator.  07/12/2020, 1:18 PM

## 2020-07-12 NOTE — Progress Notes (Signed)
Overall, there really is no change with Vicki Moon.  She does well with radiation therapy initially then near the end of it she begins to have a lot of pain.  She just has a hard time lying down flat.  I am sure this is from her plasmacytomas.  She is not eating that much.  She just does not all that hungry.  She has had no problems with bowels or bladder.  She has had some abdominal discomfort.  She is on Protonix.  I will check a urine on her to make sure there is no urinary tract infection.  She really, really, really, really would like to take a shower.  We put the order in yesterday.  Unfortunate, the floor was a little bit low and staff so they are not able to help her.  Maybe, today, she will be able to shower.  I know this will make her feel better.  Her CBC shows a white cell count of 1.8.  Hemoglobin 8.9.  Platelet count 114,000.  I suspect that the white cell count being low could be from radiation therapy.  I also suspect that myeloma is becoming more of an issue in the bone marrow.  Her calcium is 7.8.  Her albumin is 2.4.  Her creatinine 0.8.  Her vital signs are all stable.  Blood pressure is 126/57.  Her pulse is 81.  Temperature 98 degrees.  Overall there is really no change in her physical exam.  I did talk to her about hospice yesterday.  She and her husband are in agreement with this.  Hospice will follow up when she is discharged.  I told her that she just is not a candidate for any clinical trial.  Her blood counts not that good.  Mostly, her overall performance status is not adequate for a clinical trial.  We had set her up with the new myeloma therapy-Melflufen-but she ended up in the hospital.  I told her that she could still take this only if she improved her performance status significantly.  She has 2 more radiation treatments left.  I am sure she will be able to go home after her treatment tomorrow.  I know that she is getting outstanding care from all the staff  up on 6 E.  I appreciate their effort.  [place] 5:6-7

## 2020-07-12 NOTE — Progress Notes (Signed)
[facility]  Pt approved for hospice once she discharges.  DME ordered, shower chair.  [facility] will see her in the home once she discharges.  Please arrange for any comfort scripts that may be needed so there is no lapse in her comfort prior to hospice arriving in the home.  Venia Carbon RN, BSN, [facility] Liaison (in [place])

## 2020-07-13 ENCOUNTER — Ambulatory Visit
Admit: 2020-07-13 | Discharge: 2020-07-13 | Disposition: A | Discharge status: Home / Self Care | Payer: Medicare Other | Attending: Radiation Oncology | Admitting: Radiation Oncology

## 2020-07-13 ENCOUNTER — Encounter: Payer: Self-pay | Admitting: Radiation Oncology

## 2020-07-13 LAB — CBC WITH DIFFERENTIAL/PLATELET
Abs Immature Granulocytes: 0.01 10*3/uL (ref 0.00–0.07)
Basophils Absolute: 0 10*3/uL (ref 0.0–0.1)
Basophils Relative: 1 %
Eosinophils Absolute: 0 10*3/uL (ref 0.0–0.5)
Eosinophils Relative: 1 %
HCT: 27.2 % — ABNORMAL LOW (ref 36.0–46.0)
Hemoglobin: 8.6 g/dL — ABNORMAL LOW (ref 12.0–15.0)
Immature Granulocytes: 1 %
Lymphocytes Relative: 4 %
Lymphs Abs: 0 10*3/uL — ABNORMAL LOW (ref 0.7–4.0)
MCH: 31.5 pg (ref 26.0–34.0)
MCHC: 31.6 g/dL (ref 30.0–36.0)
MCV: 99.6 fL (ref 80.0–100.0)
Monocytes Absolute: 0.2 10*3/uL (ref 0.1–1.0)
Monocytes Relative: 17 %
Neutro Abs: 0.9 10*3/uL — ABNORMAL LOW (ref 1.7–7.7)
Neutrophils Relative %: 76 %
Platelets: 103 10*3/uL — ABNORMAL LOW (ref 150–400)
RBC: 2.73 MIL/uL — ABNORMAL LOW (ref 3.87–5.11)
RDW: 15.4 % (ref 11.5–15.5)
WBC: 1.1 10*3/uL — CL (ref 4.0–10.5)
nRBC: 0 % (ref 0.0–0.2)

## 2020-07-13 LAB — COMPREHENSIVE METABOLIC PANEL
ALT: 10 U/L (ref 0–44)
AST: 16 U/L (ref 15–41)
Albumin: 2.4 g/dL — ABNORMAL LOW (ref 3.5–5.0)
Alkaline Phosphatase: 70 U/L (ref 38–126)
Anion gap: 7 (ref 5–15)
BUN: 7 mg/dL — ABNORMAL LOW (ref 8–23)
CO2: 24 mmol/L (ref 22–32)
Calcium: 7.3 mg/dL — ABNORMAL LOW (ref 8.9–10.3)
Chloride: 106 mmol/L (ref 98–111)
Creatinine, Ser: 0.71 mg/dL (ref 0.44–1.00)
GFR calc Af Amer: >60 mL/min (ref >60)
GFR calc non Af Amer: >60 mL/min (ref >60)
Glucose, Bld: 141 mg/dL — ABNORMAL HIGH (ref 70–99)
Potassium: 3.6 mmol/L (ref 3.5–5.1)
Sodium: 137 mmol/L (ref 135–145)
Total Bilirubin: 0.2 mg/dL — ABNORMAL LOW (ref 0.3–1.2)
Total Protein: 6.7 g/dL (ref 6.5–8.1)

## 2020-07-13 LAB — GLUCOSE, CAPILLARY
Glucose-Capillary: 127 mg/dL — ABNORMAL HIGH (ref 70–99)
Glucose-Capillary: 129 mg/dL — ABNORMAL HIGH (ref 70–99)

## 2020-07-13 LAB — PREPARE RBC (CROSSMATCH)

## 2020-07-13 LAB — MAGNESIUM: Magnesium: 2.2 mg/dL (ref 1.7–2.4)

## 2020-07-13 MED ORDER — CIPROFLOXACIN HCL 500 MG PO TABS
500.0000 mg | ORAL_TABLET | Freq: Every day | ORAL | Status: DC
  Administered 2020-07-13: 500 mg via ORAL
  Filled 2020-07-13: qty 1

## 2020-07-13 MED ORDER — SODIUM CHLORIDE 0.9% IV SOLUTION: Freq: Once | INTRAVENOUS | Status: DC

## 2020-07-13 MED ORDER — DARBEPOETIN ALFA 200 MCG/0.4ML IJ SOSY
200.0000 ug | PREFILLED_SYRINGE | Freq: Once | INTRAMUSCULAR | Status: AC
  Administered 2020-07-13: 200 ug via SUBCUTANEOUS
  Filled 2020-07-13: qty 0.4

## 2020-07-13 MED ORDER — PANTOPRAZOLE SODIUM 40 MG PO TBEC: 40.0000 mg | DELAYED_RELEASE_TABLET | Freq: Two times a day (BID) | ORAL | 1 refills | Status: AC

## 2020-07-13 MED ORDER — FENTANYL 75 MCG/HR TD PT72: 1.0000 | MEDICATED_PATCH | TRANSDERMAL | 0 refills | Status: DC

## 2020-07-13 MED ORDER — TBO-FILGRASTIM 480 MCG/0.8ML ~~LOC~~ SOSY
480.0000 ug | PREFILLED_SYRINGE | Freq: Once | SUBCUTANEOUS | Status: AC
  Administered 2020-07-13: 480 ug via SUBCUTANEOUS
  Filled 2020-07-13: qty 0.8

## 2020-07-13 MED ORDER — HYDROMORPHONE HCL 4 MG PO TABS: 4.0000 mg | ORAL_TABLET | ORAL | 0 refills | Status: DC | PRN

## 2020-07-13 MED ORDER — MIRTAZAPINE 15 MG PO TABS: 15.0000 mg | ORAL_TABLET | Freq: Every day | ORAL | 0 refills | Status: AC

## 2020-07-13 MED ORDER — FUROSEMIDE 10 MG/ML IJ SOLN
20.0000 mg | Freq: Once | INTRAMUSCULAR | Status: DC
  Filled 2020-07-13: qty 2

## 2020-07-13 MED ORDER — POLYETHYLENE GLYCOL 3350 17 G PO PACK: 17.0000 g | PACK | Freq: Every day | ORAL | 0 refills | Status: AC | PRN

## 2020-07-13 MED ORDER — SENNOSIDES-DOCUSATE SODIUM 8.6-50 MG PO TABS: 1.0000 | ORAL_TABLET | Freq: Two times a day (BID) | ORAL | 1 refills | Status: AC

## 2020-07-13 MED ORDER — FUROSEMIDE 10 MG/ML IJ SOLN
20.0000 mg | Freq: Once | INTRAMUSCULAR | Status: AC
  Administered 2020-07-13: 20 mg via INTRAVENOUS

## 2020-07-13 MED ORDER — ONDANSETRON HCL 8 MG PO TABS: 8.0000 mg | ORAL_TABLET | Freq: Three times a day (TID) | ORAL | 0 refills | Status: DC | PRN

## 2020-07-13 MED ORDER — LORAZEPAM 1 MG PO TABS: 1.0000 mg | ORAL_TABLET | Freq: Four times a day (QID) | ORAL | 0 refills | Status: AC | PRN

## 2020-07-13 MED ORDER — CIPROFLOXACIN HCL 500 MG PO TABS: 500.0000 mg | ORAL_TABLET | Freq: Every day | ORAL | 0 refills | Status: DC

## 2020-07-13 MED ORDER — CYCLOBENZAPRINE HCL 10 MG PO TABS: 10.0000 mg | ORAL_TABLET | Freq: Three times a day (TID) | ORAL | 0 refills | Status: AC | PRN

## 2020-07-13 MED ORDER — LORAZEPAM 1 MG PO TABS: 1.0000 mg | ORAL_TABLET | Freq: Four times a day (QID) | ORAL | Status: DC | PRN

## 2020-07-13 MED ORDER — HYDROMORPHONE HCL 2 MG/ML IJ SOLN
3.5000 mg | Freq: Once | INTRAMUSCULAR | Status: AC
  Administered 2020-07-13: 3.5 mg via INTRAVENOUS
  Filled 2020-07-13: qty 2

## 2020-07-13 MED FILL — ONDANSETRON HCL 8 MG TABLET: 8 | 6 days supply | Qty: 20 | Fill #0

## 2020-07-13 MED FILL — fentaNYL 75 MCG/HR PT72: 75 | 15 days supply | Qty: 5 | Fill #0

## 2020-07-13 MED FILL — HYDROmorphone HCL 4 MG TABS: 4 | 3 days supply | Qty: 15 | Fill #0

## 2020-07-13 MED FILL — PANTOPRAZOLE SOD DR 40 MG T: 40 | 30 days supply | Qty: 60 | Fill #0

## 2020-07-13 MED FILL — CIPROFLOXACIN HCL 500 MG TA: 500 | 21 days supply | Qty: 21 | Fill #0

## 2020-07-13 MED FILL — MIRTAZAPINE 15 MG TABS: 15 | 30 days supply | Qty: 30 | Fill #0

## 2020-07-13 MED FILL — LORazepam 1 MG TABS: 1 | 3 days supply | Qty: 15 | Fill #0

## 2020-07-13 MED FILL — CYCLOBENZAPRINE HCL 10 MG T: 10 | 5 days supply | Qty: 15 | Fill #0

## 2020-07-13 NOTE — Progress Notes (Signed)
CRITICAL VALUE ALERT  Critical Value:  WBC 1.1  Date & Time Notified: 7/23 0557  Provider Notified: MD to round shortly  Orders Received/Actions taken: n/a

## 2020-07-13 NOTE — Progress Notes (Signed)
   Daily Progress Note   Patient Name: Vicki Moon       Date: 07/13/2020 DOB: [birth year]  Age: 69 y.o. MRN#: [id number] Attending Physician: Eugenie Filler, MD Primary Care Physician: [name], MD Admit Date: 07/02/2020  Reason for Consultation/Follow-up: Establishing goals of care, Non pain symptom management, Pain control and Psychosocial/spiritual support  Subjective/GOC: Patient awake, alert, oriented. Pain managed well with fentanyl TD and prn dilaudid.   She is hopeful to discharge home this evening following her final radiation treatment and blood transfusions. Plan is for home with hospice services. Reassured Dave that I will call her husband and ensure they have scripts on discharge since hospice will not admit her till the morning with late discharge. Answered questions.   Spoke with husband via telephone. Updated him on plan for the day including final XRT and blood transfusions. Discussed in detail discharge medications which we plan for him to pick up at the outpatient pharmacy before Wynema is discharged this evening. He wants to ensure he can manage her symptoms over night. Explained that hospice will coordinate admission visit in AM. Answered all questions regarding plan and medications. Emotional support provided.   Discussed with hospice liaison, Venia Carbon, RN, and Dr. Grandville Silos.    Length of Stay: 10 days  Vital Signs: BP (!) 127/62 (BP Location: Left Arm)   Pulse 80   Temp 97.8 F (36.6 C) (Oral)   Resp 17   Ht 5\' 2"  (1.575 m)   Wt 69.1 kg   SpO2 95%   BMI 27.86 kg/m  SpO2: SpO2: 95 % O2 Device: O2 Device: Room Air O2 Flow Rate: O2 Flow Rate (L/min): 10 L/min  Physical Exam: -awake, A&O x3, chronically-ill appearing, thin -RRR -normal breathing pattern -abdominal tenderness -mood appropriate           Palliative Care Assessment & Plan    Code Status:  DNR  Goals of Care/Recommendations:  Patient has decided to discharge home  with outpatient hospice after discussions with Dr. [name].  Final radiation treatment this morning. 2 PRBC's ordered prior to discharge. Patient/husband hopeful for discharge home this evening. [facility] hospice following and will admit patient at home in AM.   Discussed discharge medications in detail with attending. Husband will need scripts on discharge.   Prognosis: poor long-term prognosis  Discharge Planning: Home with Hospice   Thank you for allowing the Palliative Medicine Team to assist in the care of this patient.  Time Total: 58min  Greater than 50% of this time was spent counseling and coordinating care related to the above assessment and plan.   Ihor Dow, DNP, FNP-C Palliative Medicine Team  Phone: [phone] Fax: [phone]

## 2020-07-13 NOTE — Discharge Summary (Signed)
Physician Discharge Summary  Vicki Moon EQA:[id number] DOB: [birth year] DOA: 07/02/2020  PCP: [name], MD  Admit date: 07/02/2020 Discharge date: 07/13/2020  Time spent: 60 minutes  Recommendations for Outpatient Follow-up:  1. Patient be discharged home with palliative care and hospice. 2. Follow-up with Dr. Alinda Money, urology.  Office will call with appointment time. 3. Follow-up with Dr. [name], hematology/oncology in 1 week.  On follow-up patient will need a CBC with differential done as well as a basic metabolic profile, phosphorus, magnesium.   Discharge Diagnoses:  Principal Problem:   Multiple myeloma ([place]) Active Problems:   Essential hypertension, benign   Generalized abdominal pain   AKI (acute kidney injury) ([place])   Anemia   Type 2 diabetes mellitus (H[facility])   Depression   Pancytopenia (HCC)   Hypokalemia   Hydronephrosis of left kidney   Elevated d-dimer   Cancer related pain   Palliative care by specialist   Constipation   Hypophosphatemia   Obstructive uropathy   Discharge Condition: Stable  Diet recommendation: Regular  Filed Weights   07/02/20 1239 07/03/20 2038  Weight: 70 kg 69.1 kg    History of present illness:  HPI Per Vicki Moon is a 69 y.o. female with medical history significant of type 2 diabetes, multiple myeloma, glaucoma, herniated lumbar disc, erythropoietin deficiency anemia, anxiety who presented to the emergency department due to progressively worse chronic left flank, LUQ and left mid back area pain since Friday.  She denied fever, chills, night sweats, emesis, but complains of nausea.  Denied diarrhea, constipation, melena or hematochezia.  No dysuria, frequency or hematuria.  No rhinorrhea, sore throat, wheezing or hemoptysis.  She denied chest pain, palpitations, dizziness, diaphoresis, PND, orthopnea or pitting edema lower extremities.  ED Course: Initial vital signs temperature 98.6 F, pulse 95, respiration  18, blood pressure 113/58 mmHg and O2 sat 99% on room air.  The patient received IV fluids, antiemetics and analgesics in the emergency department.  CBC showed a white count of 3.7, hemoglobin 11.6 g/dL and platelets 248.  Urine analysis still pending.  CMP showed a CO2 of 21 mmol/L, all other electrolytes are within normal range when calcium is corrected to albumin.  Glucose 125, BUN 21 and creatinine 1.52 mg/dL.  Total protein is 8.7 albumin 3.0 g/dL.  The rest of the LFTs are within normal range.  Troponin x2 and lipase within normal range.   Hospital Course:  1 progressive multiple myeloma with abdominal pain Patient presented abdominal pain which had significantly worsened likely due to severe progression of multiple myeloma since May 2021 in the setting of hydronephrosis with calculus.  Patient status post urethral stent placement with cystoscopy and stone removal per urology with some improvement with abdominal pain.  Palliative care was consulted to follow the patient during the hospitalization for pain management as well as goals of care.  Patient premedicated prior to radiation treatments.  Patient with ongoing radiation treatments which she completed during the hospitalization.  Oncology following and recommended if continued improvement to discharge home after completion of radiation treatments, transfusion of 2 units packed red blood cells with hospice following.  Hospice was arranged and patient be discharged home with hospice.  Outpatient follow-up with oncology.   2.  Left-sided hydronephrosis/urinary calculus Left hydronephrosis likely secondary to tumor obstructing distal left ureteral, left ureteral calculus.  Patient status post  post cystoscopy, left uteroscopy with stone removal/left ureteral stent placement, left retrograde pyelography per Dr. Alinda Money (07/04/2020).  Patient  with good urine output.  Outpatient follow-up with urology.  3.  Anemia of chronic disease In the setting  of multiple myeloma.  Hemoglobin on day of discharge initially was 8.6, oncology transfused 2 units of packed red blood cells on day of discharge for palliation per oncology.  Outpatient follow-up.    4.  Diabetes mellitus type 2 Last hemoglobin A1c was 7.7 on 03/16/2020.    Patient was maintained on sliding scale insulin during the hospitalization.  Patient's farxiga was held during the hospitalization and will be resumed on discharge.  Outpatient follow-up.  5.  Pancytopenia Secondary to problem #1.  Patient's counts on day of discharge were white count of 1.1, hemoglobin of 8.6, platelet count of 103.  Patient was followed by oncology who ordered a transfusion of 2 units packed red blood cells for palliation on day of discharge, patient given a dose of Neupogen and patient started on  prophylactic antibiotics of ciprofloxacin daily per oncology recommendations.  Patient will be discharged home after transfusion of 2 units packed red blood cells on prophylactic antibiotics.  Outpatient follow-up with oncology.    6.  Acute kidney injury Secondary to prerenal azotemia secondary to dehydration in the setting of left sided hydronephrosis with possible obstructive uropathy.  Creatinine on admission was 1.52. Baseline creatinine ranging from 0.8-1.  Renal function improved with hydration and status post cystoscopy with stent placement.    Acute kidney injury had resolved by day of discharge.  Creatinine was back at baseline at 0.71 by day of discharge.  Outpatient follow-up.   7.  Elevated D-dimer In the setting of multiple myeloma.  Lower extremity Dopplers negative.  Supportive care.  8.  Cancer related pain Patient has been seen by palliative care who  helped manage patient's pain.  Patient was placed on a fentanyl patch and Percocet as needed.  Stool softener ordered.  Patient was placed on IV Dilaudid as needed.  Patient also placed on oral Dilaudid as needed per oncology.  Pain regimen adjusted  by oncology and palliative care.  Pain was better controlled during the hospitalization.  Patient will be discharged home on a fentanyl patch 75 MCG's daily, Dilaudid 4 mg p.o. every 4 hours as needed, Ativan 1 mg p.o. every 6 hours as needed.  Oncology recommended hospice on discharge and patient was set up with hospice.  Patient was discharged home with hospice.  Outpatient follow-up.   9.  Severe constipation Improved on lactulose.    Patient maintained on a bowel regimen of MiraLAX as needed, Senokot-S twice daily, Dulcolax suppository as needed.  Outpatient follow-up.    10.  Hypokalemia/hypophosphatemia/hypomagnesemia Electrolytes were repleted during the hospitalization.  Outpatient follow-up.   11.  Depression Patient maintained on home regimen of Paxil.      Procedures:  CT chest 07/03/2020  CT abdomen and pelvis 07/03/2020  CT angiogram chest 07/06/2020  Chest x-ray 07/02/2020  Lower extremity Doppler 07/04/2020  Cystoscopy/left uteroscopy and stone removal/left ureteral stent placement/left retrograde pyelography with interpretation per Dr. Alinda Money 07/04/2020  Transfusion of 2 units packed red blood cells 07/13/2020  Consultations:  Urology: Dr. Alinda Money 07/04/2020  Palliative care: Jobe Gibbon, NP 07/05/2020  Radiation/oncology: Worthy Flank, PA 07/05/2020  Oncology: Dr. [name] 07/09/2020   Discharge Exam: Vitals:   07/13/20 1600 07/13/20 1614  BP: (!) 141/51 (!) 136/55  Pulse: 88 87  Resp: 18 16  Temp: 98.4 F (36.9 C) 98.4 F (36.9 C)  SpO2: 98% 97%    General: NAD Cardiovascular:  RRR Respiratory: CTAB  Discharge Instructions   Discharge Instructions    Diet general   Complete by: As directed    Increase activity slowly   Complete by: As directed      Allergies as of 07/13/2020      Reactions   Metformin Diarrhea, Nausea Only      Medication List    STOP taking these medications   ALPRAZolam 0.5 MG tablet Commonly known  as: XANAX   fentaNYL 25 MCG/HR Commonly known as: DURAGESIC Replaced by: fentaNYL 75 MCG/HR   HYDROcodone-acetaminophen 10-325 MG tablet Commonly known as: NORCO   oxycodone-ibuprofen 5-400 MG tablet Commonly known as: COMBUNOX   temazepam 15 MG capsule Commonly known as: RESTORIL   venetoclax 100 MG Tabs     TAKE these medications   ciprofloxacin 500 MG tablet Commonly known as: CIPRO Take 1 tablet (500 mg total) by mouth daily with breakfast. Start taking on: July 14, 2020   cyclobenzaprine 10 MG tablet Commonly known as: FLEXERIL Take 1 tablet (10 mg total) by mouth 3 (three) times daily as needed for muscle spasms.   Farxiga 10 MG Tabs tablet Generic drug: dapagliflozin propanediol Take 10 mg by mouth 2 (two) times a week.   fentaNYL 75 MCG/HR Commonly known as: Taylor 1 patch onto the skin every 3 (three) days. Start taking on: July 16, 2020 Replaces: fentaNYL 25 MCG/HR   HYDROmorphone 4 MG tablet Commonly known as: DILAUDID Take 1 tablet (4 mg total) by mouth every 4 (four) hours as needed for severe pain.   latanoprost 0.005 % ophthalmic solution Commonly known as: XALATAN Place 1 drop into both eyes at bedtime.   LORazepam 1 MG tablet Commonly known as: ATIVAN Take 1 tablet (1 mg total) by mouth every 6 (six) hours as needed for anxiety or sleep.   mirtazapine 15 MG tablet Commonly known as: REMERON Take 1 tablet (15 mg total) by mouth at bedtime.   NovoLOG 100 UNIT/ML injection Generic drug: insulin aspart Inject 1.2-25 Units into the skin as directed. PATIENT USES INSULIN PUMP  Patient has a set basal rate of 1.2-1.5u per hour with meal time boluses of up to 25 units   ondansetron 8 MG tablet Commonly known as: ZOFRAN Take 1 tablet (8 mg total) by mouth every 8 (eight) hours as needed for nausea or vomiting.   pantoprazole 40 MG tablet Commonly known as: PROTONIX Take 1 tablet (40 mg total) by mouth 2 (two) times daily.    PARoxetine 30 MG tablet Commonly known as: PAXIL Take 30 mg by mouth every morning.   polyethylene glycol 17 g packet Commonly known as: MIRALAX / GLYCOLAX Take 17 g by mouth daily as needed for moderate constipation.   senna-docusate 8.6-50 MG tablet Commonly known as: Senokot-S Take 1 tablet by mouth 2 (two) times daily.      Allergies  Allergen Reactions  . Metformin Diarrhea and Nausea Only    Follow-up Information    Raynelle Bring, MD.   Specialty: Urology Why: Office will call to schedule follow up Contact information: [address] [place] [address] [phone]        [facility] up.   Specialty: PALLIATIVE CARE Contact information: [place] [phone]       [name], MD. Schedule an appointment as soon as possible for a visit in 1 week(s).   Specialty: Oncology Contact information: [address] [place]  [phone] [phone]                The results of significant diagnostics from this hospitalization (including imaging, microbiology, ancillary and laboratory) are listed below for reference.    Significant Diagnostic Studies: CT Abdomen Pelvis Wo Contrast  Addendum Date: 07/03/2020   ADDENDUM REPORT: 07/03/2020 01:09 ADDENDUM: Study discussed by telephone with Vicki Moon in the ED on 07/03/2020 at 0103 hours. Electronically Signed   By: Vicki Ann M.D.   On: 07/03/2020 01:09   Result Date: 07/03/2020 CLINICAL DATA:  69 year old female with chest pain. Increased chest and left lower abdominal pain for 3 days. Multiple myeloma. Ultrasound-guided lymph node biopsy last month positive for metastatic myeloma. EXAM: CT CHEST, ABDOMEN AND PELVIS WITHOUT CONTRAST TECHNIQUE: Multidetector CT imaging of the chest, abdomen and pelvis was performed following the standard protocol without IV contrast. COMPARISON:  Chest CT 05/08/2020 and earlier. CT Abdomen and Pelvis  06/07/2019. FINDINGS: CT CHEST FINDINGS Cardiovascular: Calcified aortic atherosclerosis. Vascular patency is not evaluated in the absence of IV contrast. No cardiomegaly or pericardial effusion. Mediastinum/Nodes: Large posterior mediastinal soft tissue mass which engulfs the descending thoracic aorta (series 2, image 40) and is contiguous with mass continuing along the left posterior lung and pleural margin. This now encompasses about 9 cm long axis and appears progressed from a much smaller 4 cm left paraspinal soft tissue mass on 05/08/2020. this abnormal soft tissue also continues inferiorly and is contiguous with bulky retroperitoneal/prevertebral tumor in the upper abdomen which is up to 4 cm in thickness (2 cm in May). No superimposed superior or middle mediastinal lymphadenopathy. No definite hilar lymphadenopathy. Lungs/Pleura: Posterior left lung/pleural tumor contiguous with the posterior mediastinum. Superimposed mild atelectasis. Major airways remain patent. There is also a small but increased rib or pleural based tumor in the anterior left lung on series 3, image 59 measuring about 7 mm (punctate in May). No superimposed hematogenous lung metastasis identified. Musculoskeletal: Diffusely abnormal bone mineralization. Widespread subtle lytic lesions in the ribs, especially the left ribs 3 through 7. Stable mild pathologic compression fracture of the T2 body. No lower thoracic vertebral destruction despite the bulky prevertebral/paraspinal tumor described above. CT ABDOMEN PELVIS FINDINGS Hepatobiliary: Chronically absent gallbladder. Negative noncontrast liver. Pancreas: Negative. Spleen: Negative, although partially inseparable from large left paraspinal and retrocrural tumor. Adrenals/Urinary Tract: Adrenal glands remain normal. Negative noncontrast right kidney and right ureter. Left hydronephrosis and hydroureter, moderate to severe, secondary to likely tumor obstruction of the distal ureter in the  pelvis (series 2, image 96) where a bulky 7 cm left presacral soft tissue mass is inseparable from the distal left ureter and the left vaginal fornix. But furthermore, there may also be a superimposed 3 mm distal left ureteral calculus on series 2, image 98, uncertain. No other nephrolithiasis is identified. Both the left presacral tumor and right lower pelvic intramuscular tumor do not appear to directly involve the urinary bladder which is mildly distended. Stomach/Bowel: Extensive diverticulosis of the large bowel, especially the descending and sigmoid colon but no active inflammation. Normal appendix. No dilated small bowel. No free air or free fluid. Vascular/Lymphatic: Thoracoabdominal aorta is engulfed by tumor from the descending segment through the renal artery level. Vascular patency is not evaluated in the absence of IV contrast. Superimposed Calcified aortic atherosclerosis. Relatively mild superimposed discrete retroperitoneal lymphadenopathy. Reproductive: Left pelvic tumor partially inseparable from the vaginal fornix. Otherwise negative noncontrast uterus and adnexa. Other: No pelvic free fluid. Musculoskeletal: Widespread abnormal bone mineralization, new  since 2020. Previous augmentation of L2. No new lumbar compression fracture. No upper lumbar vertebral erosion despite bulky prevertebral/retroperitoneal soft tissue tumor. Infiltrated sacral ala, which may be the source of a large left-side presacral pelvic tumor described in conjunction with the left ureter above. There is also bulky but indistinct intramuscular tumor along the medial right hip (series 2, image 118). And a small 2 cm focus of extraosseous tumor along the left posterior iliac wing on series 2, image 85. IMPRESSION: 1. Severe progression of multiple myeloma since May, most notable for: - bulky and contiguous posterior mediastinal, left posterior pleural, left retrocrural, and retroperitoneal tumor engulfs the Aorta from the  descending segment through the level of the renal arteries. Aortic Invasion and/or patency not evaluated in the absence of IV contrast. - bulky left pelvic/presacral tumor which is probably obstructing the distal left ureter, although there might possibly be a superimposed 3 mm distal left ureteral calculus also (series 2, image 98). 2. Additional bulky intramuscular tumor at the medial right hip musculature. Underlying diffuse skeletal myelomatous involvement. Electronically Signed: By: Vicki Ann M.D. On: 07/03/2020 00:53   DG Chest 2 View  Result Date: 07/02/2020 CLINICAL DATA:  Chest pain. EXAM: CHEST - 2 VIEW COMPARISON:  Chest CT 05/08/2020 FINDINGS: The cardiomediastinal contours are normal. The lungs are clear. Pulmonary vasculature is normal. No consolidation, pleural effusion, or pneumothorax. Right scapular lesion on CT is not well seen by radiograph. Callus about left rib fractures, lytic lesions seen on prior chest CT. Callus about few anterior right ribs. Endplate irregularity superior aspect of tentatively L2, chronic based on 06/07/2019 abdominal CT. IMPRESSION: 1. No acute radiographic findings. 2. Majority of the bone lesions on prior CT are not well seen by radiograph. Callus about left greater than right rib fractures, lytic lesions seen on prior chest CT. Electronically Signed   By: Keith Rake M.D.   On: 07/02/2020 17:37   CT Chest Wo Contrast  Addendum Date: 07/03/2020   ADDENDUM REPORT: 07/03/2020 01:09 ADDENDUM: Study discussed by telephone with Vicki Moon in the ED on 07/03/2020 at 0103 hours. Electronically Signed   By: Vicki Ann M.D.   On: 07/03/2020 01:09   Result Date: 07/03/2020 CLINICAL DATA:  69 year old female with chest pain. Increased chest and left lower abdominal pain for 3 days. Multiple myeloma. Ultrasound-guided lymph node biopsy last month positive for metastatic myeloma. EXAM: CT CHEST, ABDOMEN AND PELVIS WITHOUT CONTRAST TECHNIQUE: Multidetector CT imaging of the  chest, abdomen and pelvis was performed following the standard protocol without IV contrast. COMPARISON:  Chest CT 05/08/2020 and earlier. CT Abdomen and Pelvis 06/07/2019. FINDINGS: CT CHEST FINDINGS Cardiovascular: Calcified aortic atherosclerosis. Vascular patency is not evaluated in the absence of IV contrast. No cardiomegaly or pericardial effusion. Mediastinum/Nodes: Large posterior mediastinal soft tissue mass which engulfs the descending thoracic aorta (series 2, image 40) and is contiguous with mass continuing along the left posterior lung and pleural margin. This now encompasses about 9 cm long axis and appears progressed from a much smaller 4 cm left paraspinal soft tissue mass on 05/08/2020. this abnormal soft tissue also continues inferiorly and is contiguous with bulky retroperitoneal/prevertebral tumor in the upper abdomen which is up to 4 cm in thickness (2 cm in May). No superimposed superior or middle mediastinal lymphadenopathy. No definite hilar lymphadenopathy. Lungs/Pleura: Posterior left lung/pleural tumor contiguous with the posterior mediastinum. Superimposed mild atelectasis. Major airways remain patent. There is also a small but increased rib or pleural based tumor in  the anterior left lung on series 3, image 59 measuring about 7 mm (punctate in May). No superimposed hematogenous lung metastasis identified. Musculoskeletal: Diffusely abnormal bone mineralization. Widespread subtle lytic lesions in the ribs, especially the left ribs 3 through 7. Stable mild pathologic compression fracture of the T2 body. No lower thoracic vertebral destruction despite the bulky prevertebral/paraspinal tumor described above. CT ABDOMEN PELVIS FINDINGS Hepatobiliary: Chronically absent gallbladder. Negative noncontrast liver. Pancreas: Negative. Spleen: Negative, although partially inseparable from large left paraspinal and retrocrural tumor. Adrenals/Urinary Tract: Adrenal glands remain normal. Negative  noncontrast right kidney and right ureter. Left hydronephrosis and hydroureter, moderate to severe, secondary to likely tumor obstruction of the distal ureter in the pelvis (series 2, image 96) where a bulky 7 cm left presacral soft tissue mass is inseparable from the distal left ureter and the left vaginal fornix. But furthermore, there may also be a superimposed 3 mm distal left ureteral calculus on series 2, image 98, uncertain. No other nephrolithiasis is identified. Both the left presacral tumor and right lower pelvic intramuscular tumor do not appear to directly involve the urinary bladder which is mildly distended. Stomach/Bowel: Extensive diverticulosis of the large bowel, especially the descending and sigmoid colon but no active inflammation. Normal appendix. No dilated small bowel. No free air or free fluid. Vascular/Lymphatic: Thoracoabdominal aorta is engulfed by tumor from the descending segment through the renal artery level. Vascular patency is not evaluated in the absence of IV contrast. Superimposed Calcified aortic atherosclerosis. Relatively mild superimposed discrete retroperitoneal lymphadenopathy. Reproductive: Left pelvic tumor partially inseparable from the vaginal fornix. Otherwise negative noncontrast uterus and adnexa. Other: No pelvic free fluid. Musculoskeletal: Widespread abnormal bone mineralization, new since 2020. Previous augmentation of L2. No new lumbar compression fracture. No upper lumbar vertebral erosion despite bulky prevertebral/retroperitoneal soft tissue tumor. Infiltrated sacral ala, which may be the source of a large left-side presacral pelvic tumor described in conjunction with the left ureter above. There is also bulky but indistinct intramuscular tumor along the medial right hip (series 2, image 118). And a small 2 cm focus of extraosseous tumor along the left posterior iliac wing on series 2, image 85. IMPRESSION: 1. Severe progression of multiple myeloma since May,  most notable for: - bulky and contiguous posterior mediastinal, left posterior pleural, left retrocrural, and retroperitoneal tumor engulfs the Aorta from the descending segment through the level of the renal arteries. Aortic Invasion and/or patency not evaluated in the absence of IV contrast. - bulky left pelvic/presacral tumor which is probably obstructing the distal left ureter, although there might possibly be a superimposed 3 mm distal left ureteral calculus also (series 2, image 98). 2. Additional bulky intramuscular tumor at the medial right hip musculature. Underlying diffuse skeletal myelomatous involvement. Electronically Signed: By: Vicki Moon M.D. On: 07/03/2020 00:53   DG C-Arm 1-60 Min-No Report  Result Date: 07/04/2020 Fluoroscopy was utilized by the requesting physician.  No radiographic interpretation.   CT ANGIO CHEST AORTA W/CM & OR WO/CM  Result Date: 07/06/2020 CLINICAL DATA:  Chest pain. EXAM: CT ANGIOGRAPHY CHEST WITH CONTRAST TECHNIQUE: Multidetector CT imaging of the chest was performed using the standard protocol during bolus administration of intravenous contrast. Multiplanar CT image reconstructions and MIPs were obtained to evaluate the vascular anatomy. CONTRAST:  OMNIPAQUE IOHEXOL 350 MG/ML SOLN COMPARISON:  July 03, 2020. FINDINGS: Cardiovascular: Preferential opacification of the thoracic aorta. No evidence of thoracic aortic aneurysm or dissection. Normal heart size. No pericardial effusion. Mediastinum/Nodes: Thyroid gland is unremarkable. Esophagus  is unremarkable. As noted on prior CT, there is extensive adenopathy seen in the posterior mediastinum surrounding a large portion of the distal descending thoracic aorta. This adenopathy extends into the retroperitoneal and left retrocrural regions, were continues to surround a large portion of the aorta. Lungs/Pleura: No pneumothorax is noted. Right lung is clear. Moderate left pleural effusion is noted with adjacent  subsegmental atelectasis of left lower lobe. Upper Abdomen: Extensive retroperitoneal adenopathy in visualized portion of abdomen as described above. Musculoskeletal: Multiple ill-defined sclerotic densities are noted throughout the thoracic spine and ribs consistent with history of multiple myeloma. No acute fracture is noted. Review of the MIP images confirms the above findings. IMPRESSION: 1. No evidence of thoracic aortic dissection or aneurysm. 2. Extensive adenopathy is noted in the posterior mediastinum surrounding a large portion of the distal descending thoracic aorta. This adenopathy extends into the retroperitoneal and left retrocrural regions, where it continues to surround a large portion of the aorta. This is consistent with history of multiple myeloma. 3. Moderate left pleural effusion is noted with adjacent subsegmental atelectasis of left lower lobe. 4. Multiple ill-defined sclerotic densities are noted throughout the thoracic spine and ribs consistent with history of multiple myeloma. Electronically Signed   By: Marijo Conception M.D.   On: 07/06/2020 15:41   VAS [place] LOWER EXTREMITY VENOUS (DVT)  Result Date: 07/04/2020  Lower Venous DVTStudy Indications: Edema, and concern for PE.  Comparison Study: Lt LEV 12-29-2019 Performing Technologist: Darlin Coco  Examination Guidelines: A complete evaluation includes B-mode imaging, spectral Doppler, color Doppler, and power Doppler as needed of all accessible portions of each vessel. Bilateral testing is considered an integral part of a complete examination. Limited examinations for reoccurring indications may be performed as noted. The reflux portion of the exam is performed with the patient in reverse Trendelenburg.  +---------+---------------+---------+-----------+----------+--------------+ RIGHT    CompressibilityPhasicitySpontaneityPropertiesThrombus Aging +---------+---------------+---------+-----------+----------+--------------+ CFV       Full           Yes      Yes                                 +---------+---------------+---------+-----------+----------+--------------+ SFJ      Full                                                        +---------+---------------+---------+-----------+----------+--------------+ FV Prox  Full                                                        +---------+---------------+---------+-----------+----------+--------------+ FV Mid   Full                                                        +---------+---------------+---------+-----------+----------+--------------+ FV DistalFull                                                        +---------+---------------+---------+-----------+----------+--------------+  PFV      Full                                                        +---------+---------------+---------+-----------+----------+--------------+ POP      Full           Yes      Yes                                 +---------+---------------+---------+-----------+----------+--------------+ PTV      Full                                                        +---------+---------------+---------+-----------+----------+--------------+ PERO     Full                                                        +---------+---------------+---------+-----------+----------+--------------+   +---------+---------------+---------+-----------+----------+--------------+ LEFT     CompressibilityPhasicitySpontaneityPropertiesThrombus Aging +---------+---------------+---------+-----------+----------+--------------+ CFV      Full           Yes      Yes                                 +---------+---------------+---------+-----------+----------+--------------+ SFJ      Full                                                        +---------+---------------+---------+-----------+----------+--------------+ FV Prox  Full                                                         +---------+---------------+---------+-----------+----------+--------------+ FV Mid   Full                                                        +---------+---------------+---------+-----------+----------+--------------+ FV DistalFull                                                        +---------+---------------+---------+-----------+----------+--------------+ PFV      Full                                                        +---------+---------------+---------+-----------+----------+--------------+  POP      Full           Yes      Yes                                 +---------+---------------+---------+-----------+----------+--------------+ PTV      Full                                                        +---------+---------------+---------+-----------+----------+--------------+ PERO     Full                                                        +---------+---------------+---------+-----------+----------+--------------+     Summary: RIGHT: - There is no evidence of deep vein thrombosis in the lower extremity.  - No cystic structure found in the popliteal fossa.  LEFT: - There is no evidence of deep vein thrombosis in the lower extremity.  - No cystic structure found in the popliteal fossa.  *See table(s) above for measurements and observations. Electronically signed by Curt Jews MD on 07/04/2020 at 7:38:38 PM.    Final     Microbiology: Recent Results (from the past 240 hour(s))  MRSA PCR Screening     Status: None   Collection Time: 07/04/20  6:07 PM   Specimen: Nasal Mucosa; Nasopharyngeal  Result Value Ref Range Status   MRSA by PCR NEGATIVE NEGATIVE Final    Comment:        The GeneXpert MRSA Assay (FDA approved for NASAL specimens only), is one component of a comprehensive MRSA colonization surveillance program. It is not intended to diagnose MRSA infection nor to guide or monitor treatment for MRSA infections. Performed  at [facility], [place] [address] [place], [place] [address]      Labs: Basic Metabolic Panel: Recent Labs  Lab 07/08/20 0715 07/08/20 0715 07/09/20 4854 07/10/20 0606 07/11/20 0539 07/12/20 0539 07/13/20 0521  NA 140   < > 138 137 136 137 137  K 3.3*   < > 3.5 3.6 4.0 3.6 3.6  CL 105   < > 105 102 104 103 106  CO2 25   < > '24 26 25 25 24  '$ GLUCOSE 141*   < > 156* 163* 150* 140* 141*  BUN 8   < > 7* '9 12 8 '$ 7*  CREATININE 0.98   < > 0.82 0.92 0.81 0.80 0.71  CALCIUM 7.6*   < > 8.0* 7.7* 7.9* 7.8* 7.3*  MG  --   --   --   --   --  1.6* 2.2  PHOS 2.9  --  2.4* 3.1 2.5 2.3*  --    < > = values in this interval not displayed.   Liver Function Tests: Recent Labs  Lab 07/09/20 6270 07/10/20 0606 07/11/20 0539 07/12/20 0539 07/13/20 0521  AST  --   --   --   --  16  ALT  --   --   --   --  10  ALKPHOS  --   --   --   --  70  BILITOT  --   --   --   --  0.2*  PROT  --   --   --   --  6.7  ALBUMIN 2.7* 2.4* 2.6* 2.4* 2.4*   No results for input(s): LIPASE, AMYLASE in the last 168 hours. No results for input(s): AMMONIA in the last 168 hours. CBC: Recent Labs  Lab 07/09/20 0623 07/10/20 0606 07/11/20 0539 07/12/20 0539 07/13/20 0521  WBC 2.5* 3.6* 2.9* 1.8* 1.1*  NEUTROABS 1.9 2.9 2.4 1.5* 0.9*  HGB 9.9* 9.1* 9.5* 8.9* 8.6*  HCT 30.2* 28.5* 29.8* 28.6* 27.2*  MCV 96.2 99.0 101.0* 101.1* 99.6  PLT 148* 126* 122* 114* 103*   Cardiac Enzymes: No results for input(s): CKTOTAL, CKMB, CKMBINDEX, TROPONINI in the last 168 hours. BNP: BNP (last 3 results) No results for input(s): BNP in the last 8760 hours.  ProBNP (last 3 results) No results for input(s): PROBNP in the last 8760 hours.  CBG: Recent Labs  Lab 07/12/20 1218 07/12/20 1709 07/12/20 2127 07/13/20 0738 07/13/20 1625  GLUCAP 188* 174* 177* 127* 129*       Signed:  Irine Seal MD.  [facility] 07/13/2020, 5:01 PM

## 2020-07-13 NOTE — Progress Notes (Signed)
Pt VS WNL.  Discharge instructions provided to and reviewed with pt.  PIV discontinued.  Catheter intact and clotting within expected timeframe.  Pt transported via wheelchair to front lobby by [facility] staff.

## 2020-07-13 NOTE — Progress Notes (Signed)
[facility])  Noted plan for transfusions and radiation today.  Spoke with husband, likely would d/c later this evening.  He is concerned with her d/c the hospital late with potential pain management concerns.  Hospital is arranging comfort medications through [facility] outpatient pharmacy. Discussed with husband that using [facility] to ensure they have what she may need and then transition to pharmacy in [place].  Pt will d/c via POV with husband tonight or in the morning, whenever d/c occurs.  [facility] will plan to admit them to hospice services once they are in the home.   Thank you, Venia Carbon RN, BSN, [place] (in [place])

## 2020-07-13 NOTE — Progress Notes (Signed)
Ms. Dement will hopefully go home after radiation today.  Her hemoglobin is only 8.6.  I really think she needs to have a blood transfusion.  This will make her feel a lot better when she goes home.  I will also give her dose of Aranesp.  Her white cell count is 1.1.  I suspect this is from the radiation in addition to the myeloma in the bone marrow.  I will give her a dose of Neupogen.  I do think she needs to be on prophylactic antibiotics when she does go home.  She still is having some problems with radiation with pain and lying down.  Overall, her pain is doing better after radiation.  She does seem to be a little more comfortable.  She still does not have much of an appetite.  There is no nausea or vomiting.  She is not having diarrhea.  There is no bleeding.  There is no fever.  She has had no cough.  Her electrolytes show a BUN of 7 creatinine 0.71.  Her magnesium is 2.2.  Her albumin is 2.4.  Her vital signs are all stable.  Temperature is 97.8.  Pulse 80.  Blood pressure 127/62.  Her lungs sound clear bilaterally.  Cardiac exam regular rate and rhythm with no murmurs, rubs or bruits.  Abdomen is soft.  Bowel sounds are present.  There is no guarding or rebound tenderness.  Extremities shows no clubbing, cyanosis or edema.  Again, Ms. Frisbie should be able to go home after radiation today.  She will need to have the blood transfusion.  Again this is strictly palliative and try to help her quality of life and make her feel better when she does go home.  She will certainly need Hospice when she goes home.  I think that palliative care is in contact with [facility] and will arrange for them to see Ms. Dinger when she gets home.  I appreciate the outstanding care that she is gotten from everybody on [address].  You all have done a great job with her.  This is certainly a complicated situation.   Lattie Haw, MD  2 Timothy 4:6-8

## 2020-07-14 DIAGNOSIS — H409 Unspecified glaucoma: Secondary | ICD-10-CM | POA: Diagnosis not present

## 2020-07-14 DIAGNOSIS — M5126 Other intervertebral disc displacement, lumbar region: Secondary | ICD-10-CM | POA: Diagnosis not present

## 2020-07-14 DIAGNOSIS — C7951 Secondary malignant neoplasm of bone: Secondary | ICD-10-CM | POA: Diagnosis not present

## 2020-07-14 DIAGNOSIS — C9000 Multiple myeloma not having achieved remission: Secondary | ICD-10-CM | POA: Diagnosis not present

## 2020-07-14 DIAGNOSIS — K219 Gastro-esophageal reflux disease without esophagitis: Secondary | ICD-10-CM | POA: Diagnosis not present

## 2020-07-14 DIAGNOSIS — D630 Anemia in neoplastic disease: Secondary | ICD-10-CM | POA: Diagnosis not present

## 2020-07-14 DIAGNOSIS — N1330 Unspecified hydronephrosis: Secondary | ICD-10-CM | POA: Diagnosis not present

## 2020-07-14 DIAGNOSIS — E119 Type 2 diabetes mellitus without complications: Secondary | ICD-10-CM | POA: Diagnosis not present

## 2020-07-15 DIAGNOSIS — M5126 Other intervertebral disc displacement, lumbar region: Secondary | ICD-10-CM | POA: Diagnosis not present

## 2020-07-15 DIAGNOSIS — C7951 Secondary malignant neoplasm of bone: Secondary | ICD-10-CM | POA: Diagnosis not present

## 2020-07-15 DIAGNOSIS — N1330 Unspecified hydronephrosis: Secondary | ICD-10-CM | POA: Diagnosis not present

## 2020-07-15 DIAGNOSIS — D630 Anemia in neoplastic disease: Secondary | ICD-10-CM | POA: Diagnosis not present

## 2020-07-15 DIAGNOSIS — E119 Type 2 diabetes mellitus without complications: Secondary | ICD-10-CM | POA: Diagnosis not present

## 2020-07-15 DIAGNOSIS — C9000 Multiple myeloma not having achieved remission: Secondary | ICD-10-CM | POA: Diagnosis not present

## 2020-07-15 LAB — BPAM RBC
Blood Product Expiration Date: 202108202359
Blood Product Expiration Date: 202108222359
ISSUE DATE / TIME: 202107231225
ISSUE DATE / TIME: 202107231553
Unit Type and Rh: 5100
Unit Type and Rh: 5100

## 2020-07-15 LAB — TYPE AND SCREEN
ABO/RH(D): O POS
Antibody Screen: NEGATIVE
Unit division: 0
Unit division: 0

## 2020-07-16 ENCOUNTER — Ambulatory Visit: Payer: Medicare Other

## 2020-07-16 DIAGNOSIS — M5126 Other intervertebral disc displacement, lumbar region: Secondary | ICD-10-CM | POA: Diagnosis not present

## 2020-07-16 DIAGNOSIS — D630 Anemia in neoplastic disease: Secondary | ICD-10-CM | POA: Diagnosis not present

## 2020-07-16 DIAGNOSIS — C9000 Multiple myeloma not having achieved remission: Secondary | ICD-10-CM | POA: Diagnosis not present

## 2020-07-16 DIAGNOSIS — E119 Type 2 diabetes mellitus without complications: Secondary | ICD-10-CM | POA: Diagnosis not present

## 2020-07-16 DIAGNOSIS — C7951 Secondary malignant neoplasm of bone: Secondary | ICD-10-CM | POA: Diagnosis not present

## 2020-07-16 DIAGNOSIS — N1330 Unspecified hydronephrosis: Secondary | ICD-10-CM | POA: Diagnosis not present

## 2020-07-17 ENCOUNTER — Ambulatory Visit: Payer: Medicare Other

## 2020-07-17 ENCOUNTER — Telehealth: Payer: Self-pay | Admitting: *Deleted

## 2020-07-17 DIAGNOSIS — C7951 Secondary malignant neoplasm of bone: Secondary | ICD-10-CM | POA: Diagnosis not present

## 2020-07-17 DIAGNOSIS — N1330 Unspecified hydronephrosis: Secondary | ICD-10-CM | POA: Diagnosis not present

## 2020-07-17 DIAGNOSIS — D630 Anemia in neoplastic disease: Secondary | ICD-10-CM | POA: Diagnosis not present

## 2020-07-17 DIAGNOSIS — E119 Type 2 diabetes mellitus without complications: Secondary | ICD-10-CM | POA: Diagnosis not present

## 2020-07-17 DIAGNOSIS — C9000 Multiple myeloma not having achieved remission: Secondary | ICD-10-CM | POA: Diagnosis not present

## 2020-07-17 DIAGNOSIS — M5126 Other intervertebral disc displacement, lumbar region: Secondary | ICD-10-CM | POA: Diagnosis not present

## 2020-07-17 NOTE — Telephone Encounter (Signed)
Call received from patient's husband wanting to know what the plan is for patient per Dr. [name]. Pt.'s husband notified per order of Dr. [name] that Dr. [name] would like to see patient this week. Appt made for this Thursday, 07/19/20 at 0800.  Pt.'s husband aware.

## 2020-07-18 ENCOUNTER — Ambulatory Visit: Payer: Medicare Other

## 2020-07-19 ENCOUNTER — Telehealth: Payer: Self-pay | Admitting: Hematology & Oncology

## 2020-07-19 ENCOUNTER — Inpatient Hospital Stay (HOSPITAL_BASED_OUTPATIENT_CLINIC_OR_DEPARTMENT_OTHER): Payer: Medicare Other | Admitting: Hematology & Oncology

## 2020-07-19 ENCOUNTER — Inpatient Hospital Stay: Payer: Medicare Other

## 2020-07-19 ENCOUNTER — Encounter: Payer: Self-pay | Admitting: Hematology & Oncology

## 2020-07-19 ENCOUNTER — Ambulatory Visit: Payer: Medicare Other

## 2020-07-19 ENCOUNTER — Other Ambulatory Visit: Payer: Self-pay

## 2020-07-19 VITALS — BP 129/59 | HR 101 | Temp 97.7°F | Resp 18 | Wt 142.0 lb

## 2020-07-19 DIAGNOSIS — C9000 Multiple myeloma not having achieved remission: Secondary | ICD-10-CM | POA: Diagnosis not present

## 2020-07-19 DIAGNOSIS — C9002 Multiple myeloma in relapse: Secondary | ICD-10-CM

## 2020-07-19 DIAGNOSIS — M5126 Other intervertebral disc displacement, lumbar region: Secondary | ICD-10-CM | POA: Diagnosis not present

## 2020-07-19 DIAGNOSIS — C7951 Secondary malignant neoplasm of bone: Secondary | ICD-10-CM | POA: Diagnosis not present

## 2020-07-19 DIAGNOSIS — N1330 Unspecified hydronephrosis: Secondary | ICD-10-CM | POA: Diagnosis not present

## 2020-07-19 DIAGNOSIS — R634 Abnormal weight loss: Secondary | ICD-10-CM | POA: Diagnosis not present

## 2020-07-19 DIAGNOSIS — R590 Localized enlarged lymph nodes: Secondary | ICD-10-CM | POA: Diagnosis not present

## 2020-07-19 DIAGNOSIS — J90 Pleural effusion, not elsewhere classified: Secondary | ICD-10-CM | POA: Diagnosis not present

## 2020-07-19 DIAGNOSIS — Z923 Personal history of irradiation: Secondary | ICD-10-CM | POA: Diagnosis not present

## 2020-07-19 DIAGNOSIS — D630 Anemia in neoplastic disease: Secondary | ICD-10-CM | POA: Diagnosis not present

## 2020-07-19 DIAGNOSIS — E119 Type 2 diabetes mellitus without complications: Secondary | ICD-10-CM | POA: Diagnosis not present

## 2020-07-19 DIAGNOSIS — Z79899 Other long term (current) drug therapy: Secondary | ICD-10-CM | POA: Diagnosis not present

## 2020-07-19 LAB — SAMPLE TO BLOOD BANK

## 2020-07-19 LAB — CBC WITH DIFFERENTIAL (CANCER CENTER ONLY)
Abs Immature Granulocytes: 0.11 K/uL — ABNORMAL HIGH (ref 0.00–0.07)
Basophils Absolute: 0 K/uL (ref 0.0–0.1)
Basophils Relative: 1 %
Eosinophils Absolute: 0 K/uL (ref 0.0–0.5)
Eosinophils Relative: 2 %
HCT: 39.5 % (ref 36.0–46.0)
Hemoglobin: 13.2 g/dL (ref 12.0–15.0)
Immature Granulocytes: 6 %
Lymphocytes Relative: 3 %
Lymphs Abs: 0.1 K/uL — ABNORMAL LOW (ref 0.7–4.0)
MCH: 31.1 pg (ref 26.0–34.0)
MCHC: 33.4 g/dL (ref 30.0–36.0)
MCV: 92.9 fL (ref 80.0–100.0)
Monocytes Absolute: 0.4 K/uL (ref 0.1–1.0)
Monocytes Relative: 21 %
Neutro Abs: 1.3 K/uL — ABNORMAL LOW (ref 1.7–7.7)
Neutrophils Relative %: 67 %
Platelet Count: 48 K/uL — ABNORMAL LOW (ref 150–400)
RBC: 4.25 MIL/uL (ref 3.87–5.11)
RDW: 16 % — ABNORMAL HIGH (ref 11.5–15.5)
WBC Count: 1.9 K/uL — ABNORMAL LOW (ref 4.0–10.5)
nRBC: 0 % (ref 0.0–0.2)

## 2020-07-19 LAB — CMP (CANCER CENTER ONLY)
ALT: 7 U/L (ref 0–44)
AST: 15 U/L (ref 15–41)
Albumin: 3.4 g/dL — ABNORMAL LOW (ref 3.5–5.0)
Alkaline Phosphatase: 82 U/L (ref 38–126)
Anion gap: 9 (ref 5–15)
BUN: 12 mg/dL (ref 8–23)
CO2: 25 mmol/L (ref 22–32)
Calcium: 8.8 mg/dL — ABNORMAL LOW (ref 8.9–10.3)
Chloride: 101 mmol/L (ref 98–111)
Creatinine: 0.74 mg/dL (ref 0.44–1.00)
GFR, Est AFR Am: >60 mL/min
GFR, Estimated: >60 mL/min
Glucose, Bld: 178 mg/dL — ABNORMAL HIGH (ref 70–99)
Potassium: 3.5 mmol/L (ref 3.5–5.1)
Sodium: 135 mmol/L (ref 135–145)
Total Bilirubin: 0.5 mg/dL (ref 0.3–1.2)
Total Protein: 7.6 g/dL (ref 6.5–8.1)

## 2020-07-19 MED ORDER — HYDROMORPHONE HCL 4 MG/ML IJ SOLN
INTRAMUSCULAR | Status: AC
  Filled 2020-07-19: qty 1

## 2020-07-19 MED ORDER — HYDROMORPHONE HCL 4 MG/ML IJ SOLN
3.0000 mg | Freq: Once | INTRAMUSCULAR | Status: AC
  Administered 2020-07-19: 3 mg via INTRAVENOUS

## 2020-07-19 MED ORDER — HYDROMORPHONE HCL 1 MG/ML IJ SOLN
INTRAMUSCULAR | Status: AC
  Filled 2020-07-19: qty 3

## 2020-07-19 MED ORDER — TBO-FILGRASTIM 480 MCG/0.8ML ~~LOC~~ SOSY: 480.0000 ug | PREFILLED_SYRINGE | Freq: Once | SUBCUTANEOUS | Status: DC

## 2020-07-19 NOTE — Progress Notes (Signed)
Hematology and Oncology Follow Up Visit  Vicki Moon [id number] [birth year] 69 y.o. 07/19/2020   Principle Diagnosis:  IgG Kappa myeloma -- 1p-, 13q-, 16q- and t(11:14)  Past Therapy: RVD -- start on 05/09/2019 -- d/c revlimid on 06/14/2019 -- d/c on 06/28/2019 S/P Kyphoplasty at L2 XRT to lumbar spine -- completed in [place] [facility] -- startedon 07/12/2019- s/p cycle4 -- d/c on 10/19/2019 due to disease progression  Current Therapy:   Selinexor/Velcade -- start cycle #1 on 02/06/2020 - d/c on 03/03 Kyprolis/Faspro -- start on 10/26/2019 -- s/p cycle #4 -- d/c due to progression Xgeva 120 mg sq q 3 months -- next dose on09/2021 Venetoclax 200 mg po q day -- started on 03/30/2020 -- d/c on 06/14/2020 Melflufen 40 mg iv q month -- start on 07/03/2020 XRT for extramedullary plasmacytoma -- start on 05/30/2020   Interim History:  Vicki Moon is here today for follow-up.  Unfortunately, she was hospitalized at [facility].  She was hospitalized after she and her family got back from the beach.  Unfortunately, she had to go down to the beach without her medications.  For some reason, the pharmacy would not fill her pain medications.  She had quite a bit of pain in the left side.  She underwent a CT scan.  This was done on 07/06/2020.  She has extensive adenopathy in the posterior mediastinum surrounding the ascending thoracic aorta.  She has adenopathy in the retroperitoneal and left retrocrural regions.  She has a moderate left pleural effusion.  She has the bone lesions.  She received radiation therapy to the left pelvic area.  She had 5 treatments.  Her performance status declined quite a bit.  She really was not eating well.  She was losing weight.  She was having a lot of problems with pain.  We had to increase her pain medications in the hospital.  We got Hospice for her.  I really thought this was a good idea.  Hospice could really help out at home and help with  medications.  She still is not doing all that well.  She is not eating.  Her weight is now 142 pounds.  She is still in a wheelchair.  She walks a little bit.  She comes in with her husband.  They really want to try to keep on some kind of therapy.  We had set her up with Melflufen when she returned from her vacation but she was admitted.  I told her that we could consider Melflufen but she really needs to improve her performance status.  Currently, her performance status is ECOG 3.  We are dealing with quality of life issues.  I really would like to have her quality of life as best as possible.  Her platelet count is decreasing.  This might be from radiation to the pelvic area.  It also could be from myeloma that is progressing.  When we saw her in the office last time back in early July, her IgG level was 3325 mg/dL.  Her M spike was 2.2 g/dL.  Her kappa light chain was 56 mg/dL.  Her blood sugars have been under a little bit better control.  I think a lot of this has to do with the fact that she is not eating all that much.  She does have some bruising.  She has had no bleeding.   Medications:  Allergies as of 07/19/2020      Reactions   Metformin Diarrhea, Nausea Only  Medication List       Accurate as of July 19, 2020  5:17 PM. If you have any questions, ask your nurse or doctor.        ciprofloxacin 500 MG tablet Commonly known as: CIPRO Take 1 tablet (500 mg total) by mouth daily with breakfast.   cyclobenzaprine 10 MG tablet Commonly known as: FLEXERIL Take 1 tablet (10 mg total) by mouth 3 (three) times daily as needed for muscle spasms.   Farxiga 10 MG Tabs tablet Generic drug: dapagliflozin propanediol Take 10 mg by mouth 2 (two) times a week.   fentaNYL 75 MCG/HR Commonly known as: [place] 1 patch onto the skin every 3 (three) days.   HYDROmorphone 4 MG tablet Commonly known as: DILAUDID Take 1 tablet (4 mg total) by mouth every 4 (four) hours  as needed for severe pain.   latanoprost 0.005 % ophthalmic solution Commonly known as: XALATAN Place 1 drop into both eyes at bedtime.   LORazepam 1 MG tablet Commonly known as: ATIVAN Take 1 tablet (1 mg total) by mouth every 6 (six) hours as needed for anxiety or sleep.   mirtazapine 15 MG tablet Commonly known as: REMERON Take 1 tablet (15 mg total) by mouth at bedtime.   NovoLOG 100 UNIT/ML injection Generic drug: insulin aspart Inject 1.2-25 Units into the skin as directed. PATIENT USES INSULIN PUMP  Patient has a set basal rate of 1.2-1.5u per hour with meal time boluses of up to 25 units   ondansetron 8 MG tablet Commonly known as: ZOFRAN Take 1 tablet (8 mg total) by mouth every 8 (eight) hours as needed for nausea or vomiting.   pantoprazole 40 MG tablet Commonly known as: PROTONIX Take 1 tablet (40 mg total) by mouth 2 (two) times daily.   PARoxetine 30 MG tablet Commonly known as: PAXIL Take 30 mg by mouth every morning.   polyethylene glycol 17 g packet Commonly known as: MIRALAX / GLYCOLAX Take 17 g by mouth daily as needed for moderate constipation.   senna-docusate 8.6-50 MG tablet Commonly known as: Senokot-S Take 1 tablet by mouth 2 (two) times daily.       Allergies:  Allergies  Allergen Reactions  . Metformin Diarrhea and Nausea Only    Past Medical History, Surgical history, Social history, and Family History were reviewed and updated.  Review of Systems: Review of Systems  Constitutional: Negative.   HENT: Negative.   Eyes: Negative.   Respiratory: Negative.   Cardiovascular: Negative.   Gastrointestinal: Negative.   Genitourinary: Negative.   Musculoskeletal: Negative.   Skin: Negative.   Neurological: Negative.   Endo/Heme/Allergies: Negative.   Psychiatric/Behavioral: Negative.      Physical Exam:  weight is 142 lb (64.4 kg). Her oral temperature is 97.7 F (36.5 C). Her blood pressure is 129/59 (abnormal) and her pulse is  101. Her respiration is 18 and oxygen saturation is 100%.   Wt Readings from Last 3 Encounters:  07/19/20 142 lb (64.4 kg)  07/03/20 152 lb 5.4 oz (69.1 kg)  06/21/20 155 lb (70.3 kg)    Physical Exam Vitals reviewed.  Constitutional:      Comments: This is a chronic ill-appearing white female.  She is in some distress secondary to pain in the left abdomen.  She looks weak.  She has lost weight.  Her skin has some scattered ecchymoses.  HENT:     Head: Normocephalic and atraumatic.  Eyes:     Pupils: Pupils are equal, round,  and reactive to light.  Cardiovascular:     Rate and Rhythm: Normal rate and regular rhythm.     Heart sounds: Normal heart sounds.  Pulmonary:     Effort: Pulmonary effort is normal.     Breath sounds: Normal breath sounds.  Abdominal:     General: Bowel sounds are normal.     Palpations: Abdomen is soft.  Musculoskeletal:        General: No tenderness or deformity. Normal range of motion.     Cervical back: Normal range of motion.     Comments: Her extremities, she does have the mass in the lateral aspect of the right shoulder area. This is almost post axillary. It is firm. It is slightly tender. There is no warmth.  Lymphadenopathy:     Cervical: No cervical adenopathy.  Skin:    General: Skin is warm and dry.     Findings: No erythema or rash.  Neurological:     Mental Status: She is alert and oriented to person, place, and time.  Psychiatric:        Behavior: Behavior normal.        Thought Content: Thought content normal.        Judgment: Judgment normal.      Lab Results  Component Value Date   WBC 1.9 (L) 07/19/2020   HGB 13.2 07/19/2020   HCT 39.5 07/19/2020   MCV 92.9 07/19/2020   PLT 48 (L) 07/19/2020   Lab Results  Component Value Date   FERRITIN 1,339 (H) 03/13/2020   IRON 101 03/13/2020   TIBC 194 (L) 03/13/2020   UIBC 92 (L) 03/13/2020   IRONPCTSAT 52 03/13/2020   Lab Results  Component Value Date   RETICCTPCT 1.2  02/15/2020   RBC 4.25 07/19/2020   Lab Results  Component Value Date   KPAFRELGTCHN 559.7 (H) 06/21/2020   LAMBDASER 3.4 (L) 06/21/2020   KAPLAMBRATIO 164.62 (H) 06/21/2020   Lab Results  Component Value Date   IGGSERUM 2,994 (H) 06/21/2020   IGGSERUM 3,325 (H) 06/21/2020   IGA 7 (L) 06/21/2020   IGA 8 (L) 06/21/2020   IGMSERUM 13 (L) 06/21/2020   IGMSERUM 13 (L) 06/21/2020   Lab Results  Component Value Date   TOTALPROTELP 7.8 06/21/2020   ALBUMINELP 3.4 06/21/2020   A1GS 0.3 06/21/2020   A2GS 0.9 06/21/2020   BETS 0.8 06/21/2020   GAMS 2.3 (H) 06/21/2020   MSPIKE 2.1 (H) 06/21/2020   SPEI Comment 03/23/2020     Chemistry      Component Value Date/Time   NA 135 07/19/2020 0827   K 3.5 07/19/2020 0827   CL 101 07/19/2020 0827   CO2 25 07/19/2020 0827   BUN 12 07/19/2020 0827   CREATININE 0.74 07/19/2020 0827   CREATININE 0.65 01/12/2018 1004      Component Value Date/Time   CALCIUM 8.8 (L) 07/19/2020 0827   ALKPHOS 82 07/19/2020 0827   AST 15 07/19/2020 0827   ALT 7 07/19/2020 0827   BILITOT 0.5 07/19/2020 0827       Impression and Plan: Ms. Lewan is a very pleasant 69 yo caucasian female with IgG kappa myeloma, with what I would consider to have high risk cytogenetics.  I we will have to say that radiation therapy has helped her plasmacytomas in the past.  Hopefully this will be true for what she had done with radiation to her left pelvic area.  I am told both she and her husband that it is  going to be very difficult to treat her with her platelet count being so low.  I am hoping that this is just from the radiation therapy and that the platelet count will improve.  I suppose that we could give Melflufen to try.  Is just 1 treatment a month.  The toxicity profile is fairly agreeable for patients.  I just worry about the myelosuppressive component.  I know that she is trying hard.  I know she has great support from her family.  We will certainly do our  best to try to help arrange try to treat her.  Again, I think this will be difficult if we cannot get her platelet count better and if her performance status is not improve.  I spent close to an hour with she and her husband this morning.  I just want to make sure that I answered all their questions and try to address their concerns.  We did give her a dose of Dilaudid (3 mg) under the skin to try to help with her pain so she could get home.    I will see her back in another couple weeks or so.    [name], MD 7/29/20215:17 PM

## 2020-07-19 NOTE — Progress Notes (Signed)
Pt not given Granix today, due to hospice will not cover it per Judeen Hammans at [facility].  Dr. [name] notified.

## 2020-07-19 NOTE — Patient Instructions (Signed)
Hydromorphone injection What is this medicine? HYDROMORPHONE (hye droe MOR fone) is a pain reliever. It is used to treat moderate to severe pain. This medicine may be used for other purposes; ask your health care provider or pharmacist if you have questions. COMMON BRAND NAME(S): Dilaudid, Dilaudid-HP, Simplist Dilaudid What should I tell my health care provider before I take this medicine? They need to know if you have any of these conditions:  brain tumor  drug abuse or addiction  head injury  heart disease  if you often drink alcohol  kidney disease  liver disease  lung or breathing disease, like asthma  problems urinating  seizures  stomach or intestine problems  an unusual or allergic reaction to hydromorphone, other medicines, foods, dyes, or preservatives  pregnant or trying to get pregnant  breast-feeding How should I use this medicine? This medicine is for injection into a vein, into a muscle, or under the skin. It is usually given by a health care professional in a hospital or clinic setting. In rare cases, you might get this medicine at home. You will be taught how to give this medicine. Use exactly as directed. Take your medicine at regular intervals. Do not take your medicine more often than directed. It is important that you put your used needles and syringes in a special sharps container. Do not put them in a trash can. If you do not have a sharps container, call your pharmacist or healthcare provider to get one. Talk to your pediatrician regarding the use of this medicine in children. Special care may be needed. Overdosage: If you think you have taken too much of this medicine contact a poison control center or emergency room at once. NOTE: This medicine is only for you. Do not share this medicine with others. What if I miss a dose? If you miss a dose, use it as soon as you can. If it is almost time for your next dose, use only that dose. Do not use double  or extra doses. What may interact with this medicine? This medicine may interact with the following medications:  alcohol  antihistamines for allergy, cough and cold  certain medicines for anxiety or sleep  certain medicines for depression like amitriptyline, fluoxetine, sertraline  certain medicines for seizures like phenobarbital, primidone  general anesthetics like halothane, isoflurane, methoxyflurane, propofol  local anesthetics like lidocaine, pramoxine, tetracaine  MAOIs like Carbex, Eldepryl, Marplan, Nardil, and Parnate  medicines that relax muscles for surgery  other narcotic medicines for pain or cough  phenothiazines like chlorpromazine, mesoridazine, prochlorperazine, thioridazine This list may not describe all possible interactions. Give your health care provider a list of all the medicines, herbs, non-prescription drugs, or dietary supplements you use. Also tell them if you smoke, drink alcohol, or use illegal drugs. Some items may interact with your medicine. What should I watch for while using this medicine? Tell your health care provider if your pain does not go away, if it gets worse, or if you have new or a different type of pain. You may develop tolerance to this drug. Tolerance means that you will need a higher dose of the drug for pain relief. Tolerance is normal and is expected if you take this drug for a long time. There are different types of narcotic drugs (opioids) for pain. If you take more than one type at the same time, you may have more side effects. Give your health care provider a list of all drugs you use. He or   she will tell you how much drug to take. Do not take more drug than directed. Get emergency help right away if you have problems breathing. Do not suddenly stop taking your drug because you may develop a severe reaction. Your body becomes used to the drug. This does NOT mean you are addicted. Addiction is a behavior related to getting and using  a drug for a nonmedical reason. If you have pain, you have a medical reason to take pain drug. Your health care provider will tell you how much drug to take. If your health care provider wants you to stop the drug, the dose will be slowly lowered over time to avoid any side effects. Talk to your health care provider about naloxone and how to get it. Naloxone is an emergency drug used for an opioid overdose. An overdose can happen if you take too much opioid. It can also happen if an opioid is taken with some other drugs or substances, like alcohol. Know the symptoms of an overdose, like trouble breathing, unusually tired or sleepy, or not being able to respond or wake up. Make sure to tell caregivers and close contacts where it is stored. Make sure they know how to use it. After naloxone is given, you must get emergency help right away. Naloxone is a temporary treatment. Repeat doses may be needed. You may get drowsy or dizzy. Do not drive, use machinery, or do anything that needs mental alertness until you know how this drug affects you. Do not stand up or sit up quickly, especially if you are an older patient. This reduces the risk of dizzy or fainting spells. Alcohol may interfere with the effect of this drug. Avoid alcoholic drinks. This drug will cause constipation. If you do not have a bowel movement for 3 days, call your health care provider. Your mouth may get dry. Chewing sugarless gum or sucking hard candy and drinking plenty of water may help. Contact your health care provider if the problem does not go away or is severe. What side effects may I notice from receiving this medicine? Side effects that you should report to your doctor or health care professional as soon as possible:  allergic reactions like skin rash, itching or hives, swelling of the face, lips, or tongue  breathing problems  confusion  seizures  signs and symptoms of low blood pressure like dizziness; feeling faint or  lightheaded, falls; unusually weak or tired  trouble passing urine or change in the amount of urine Side effects that usually do not require medical attention (report to your doctor or health care professional if they continue or are bothersome):  constipation  dry mouth  nausea, vomiting  tiredness This list may not describe all possible side effects. Call your doctor for medical advice about side effects. You may report side effects to FDA at 1-800-FDA-1088. Where should I keep my medicine? Keep out of the reach of children. This medicine can be abused. Keep your medicine in a safe place to protect it from theft. Do not share this medicine with anyone. Selling or giving away this medicine is dangerous and against the law. If you are using this medicine at home, you will be instructed on how to store this medicine. This medicine may cause accidental overdose and death if it is taken by other adults, children, or pets. Flush any unused medicine down the toilet to reduce the chance of harm. Do not use the medicine after the expiration date. NOTE: This sheet   is a summary. It may not cover all possible information. If you have questions about this medicine, talk to your doctor, pharmacist, or health care provider.  2020 Elsevier/Gold Standard (2019-07-18 11:27:33)  

## 2020-07-19 NOTE — Telephone Encounter (Signed)
Appointments scheduled calendar printed per 7/29 los 

## 2020-07-20 ENCOUNTER — Other Ambulatory Visit: Payer: Self-pay | Admitting: *Deleted

## 2020-07-20 ENCOUNTER — Ambulatory Visit: Payer: Medicare Other

## 2020-07-20 DIAGNOSIS — N1330 Unspecified hydronephrosis: Secondary | ICD-10-CM | POA: Diagnosis not present

## 2020-07-20 DIAGNOSIS — C7951 Secondary malignant neoplasm of bone: Secondary | ICD-10-CM | POA: Diagnosis not present

## 2020-07-20 DIAGNOSIS — C9000 Multiple myeloma not having achieved remission: Secondary | ICD-10-CM | POA: Diagnosis not present

## 2020-07-20 DIAGNOSIS — D630 Anemia in neoplastic disease: Secondary | ICD-10-CM | POA: Diagnosis not present

## 2020-07-20 DIAGNOSIS — E119 Type 2 diabetes mellitus without complications: Secondary | ICD-10-CM | POA: Diagnosis not present

## 2020-07-20 DIAGNOSIS — M5126 Other intervertebral disc displacement, lumbar region: Secondary | ICD-10-CM | POA: Diagnosis not present

## 2020-07-20 LAB — KAPPA/LAMBDA LIGHT CHAINS
Kappa free light chain: 442.6 mg/L — ABNORMAL HIGH (ref 3.3–19.4)
Kappa, lambda light chain ratio: 152.62 — ABNORMAL HIGH (ref 0.26–1.65)
Lambda free light chains: 2.9 mg/L — ABNORMAL LOW (ref 5.7–26.3)

## 2020-07-20 LAB — IGG, IGA, IGM
IgA: 8 mg/dL — ABNORMAL LOW (ref 64–422)
IgG (Immunoglobin G), Serum: 2514 mg/dL — ABNORMAL HIGH (ref 586–1602)
IgM (Immunoglobulin M), Srm: 9 mg/dL — ABNORMAL LOW (ref 26–217)

## 2020-07-20 MED ORDER — DRONABINOL 2.5 MG PO CAPS: 2.5000 mg | ORAL_CAPSULE | Freq: Two times a day (BID) | ORAL | 0 refills | Status: AC

## 2020-07-23 LAB — PROTEIN ELECTROPHORESIS, SERUM, WITH REFLEX
A/G Ratio: 0.6 — ABNORMAL LOW (ref 0.7–1.7)
Albumin ELP: 2.8 g/dL — ABNORMAL LOW (ref 2.9–4.4)
Alpha-1-Globulin: 0.4 g/dL (ref 0.0–0.4)
Alpha-2-Globulin: 1.1 g/dL — ABNORMAL HIGH (ref 0.4–1.0)
Beta Globulin: 0.9 g/dL (ref 0.7–1.3)
Gamma Globulin: 2 g/dL — ABNORMAL HIGH (ref 0.4–1.8)
Globulin, Total: 4.4 g/dL — ABNORMAL HIGH (ref 2.2–3.9)
M-Spike, %: 1.9 g/dL — ABNORMAL HIGH
SPEP Interpretation: 0
Total Protein ELP: 7.2 g/dL (ref 6.0–8.5)

## 2020-07-23 LAB — IMMUNOFIXATION REFLEX, SERUM
IgA: 8 mg/dL — ABNORMAL LOW (ref 64–422)
IgG (Immunoglobin G), Serum: 2767 mg/dL — ABNORMAL HIGH (ref 586–1602)
IgM (Immunoglobulin M), Srm: 11 mg/dL — ABNORMAL LOW (ref 26–217)

## 2020-07-27 ENCOUNTER — Other Ambulatory Visit: Payer: Self-pay | Admitting: *Deleted

## 2020-07-27 DIAGNOSIS — D61818 Other pancytopenia: Secondary | ICD-10-CM

## 2020-07-27 DIAGNOSIS — G893 Neoplasm related pain (acute) (chronic): Secondary | ICD-10-CM

## 2020-07-27 DIAGNOSIS — C9000 Multiple myeloma not having achieved remission: Secondary | ICD-10-CM

## 2020-07-27 MED ORDER — FENTANYL 75 MCG/HR TD PT72: 1.0000 | MEDICATED_PATCH | TRANSDERMAL | 0 refills | Status: DC

## 2020-07-27 MED FILL — fentaNYL 75 MCG/HR PT72: 75 | 15 days supply | Qty: 5 | Fill #0

## 2020-07-30 ENCOUNTER — Telehealth: Payer: Self-pay | Admitting: *Deleted

## 2020-07-30 ENCOUNTER — Other Ambulatory Visit: Payer: Self-pay | Admitting: *Deleted

## 2020-07-30 DIAGNOSIS — D61818 Other pancytopenia: Secondary | ICD-10-CM

## 2020-07-30 DIAGNOSIS — C9000 Multiple myeloma not having achieved remission: Secondary | ICD-10-CM

## 2020-07-30 DIAGNOSIS — G893 Neoplasm related pain (acute) (chronic): Secondary | ICD-10-CM

## 2020-07-30 MED ORDER — FENTANYL 75 MCG/HR TD PT72: 1.0000 | MEDICATED_PATCH | TRANSDERMAL | 0 refills | Status: DC

## 2020-07-30 NOTE — Telephone Encounter (Signed)
Call received from patient's husband requesting refill of Fentanyl 75 mcg patches.  Pt.'s husband states that he did not want refill to go to [facility], and wanted it to go to [facility].  Refill request canceled at [facility] and resent to [place] per request of pt.'s husband.

## 2020-08-01 ENCOUNTER — Other Ambulatory Visit: Payer: Self-pay

## 2020-08-01 ENCOUNTER — Inpatient Hospital Stay (HOSPITAL_BASED_OUTPATIENT_CLINIC_OR_DEPARTMENT_OTHER): Admitting: Hematology & Oncology

## 2020-08-01 ENCOUNTER — Inpatient Hospital Stay

## 2020-08-01 ENCOUNTER — Encounter: Payer: Self-pay | Admitting: Hematology & Oncology

## 2020-08-01 ENCOUNTER — Other Ambulatory Visit: Payer: Self-pay | Admitting: Hematology & Oncology

## 2020-08-01 ENCOUNTER — Inpatient Hospital Stay: Attending: Hematology & Oncology

## 2020-08-01 DIAGNOSIS — M25512 Pain in left shoulder: Secondary | ICD-10-CM | POA: Insufficient documentation

## 2020-08-01 DIAGNOSIS — E860 Dehydration: Secondary | ICD-10-CM

## 2020-08-01 DIAGNOSIS — C9000 Multiple myeloma not having achieved remission: Secondary | ICD-10-CM

## 2020-08-01 DIAGNOSIS — C9002 Multiple myeloma in relapse: Secondary | ICD-10-CM | POA: Diagnosis not present

## 2020-08-01 DIAGNOSIS — Z79899 Other long term (current) drug therapy: Secondary | ICD-10-CM | POA: Insufficient documentation

## 2020-08-01 DIAGNOSIS — Z5189 Encounter for other specified aftercare: Secondary | ICD-10-CM | POA: Insufficient documentation

## 2020-08-01 DIAGNOSIS — R110 Nausea: Secondary | ICD-10-CM | POA: Insufficient documentation

## 2020-08-01 LAB — CBC WITH DIFFERENTIAL/PLATELET
Abs Immature Granulocytes: 0.01 10*3/uL (ref 0.00–0.07)
Basophils Absolute: 0 10*3/uL (ref 0.0–0.1)
Basophils Relative: 1 %
Eosinophils Absolute: 0.1 10*3/uL (ref 0.0–0.5)
Eosinophils Relative: 3 %
HCT: 46.6 % — ABNORMAL HIGH (ref 36.0–46.0)
Hemoglobin: 15.6 g/dL — ABNORMAL HIGH (ref 12.0–15.0)
Immature Granulocytes: 1 %
Lymphocytes Relative: 11 %
Lymphs Abs: 0.2 10*3/uL — ABNORMAL LOW (ref 0.7–4.0)
MCH: 30.7 pg (ref 26.0–34.0)
MCHC: 33.5 g/dL (ref 30.0–36.0)
MCV: 91.7 fL (ref 80.0–100.0)
Monocytes Absolute: 0.5 10*3/uL (ref 0.1–1.0)
Monocytes Relative: 21 %
Neutro Abs: 1.4 10*3/uL — ABNORMAL LOW (ref 1.7–7.7)
Neutrophils Relative %: 63 %
Platelets: 54 10*3/uL — ABNORMAL LOW (ref 150–400)
RBC: 5.08 MIL/uL (ref 3.87–5.11)
RDW: 14.9 % (ref 11.5–15.5)
WBC: 2.2 10*3/uL — ABNORMAL LOW (ref 4.0–10.5)
nRBC: 0 % (ref 0.0–0.2)

## 2020-08-01 LAB — CMP (CANCER CENTER ONLY)
ALT: 14 U/L (ref 0–44)
AST: 19 U/L (ref 15–41)
Albumin: 3.5 g/dL (ref 3.5–5.0)
Alkaline Phosphatase: 120 U/L (ref 38–126)
Anion gap: 10 (ref 5–15)
BUN: 12 mg/dL (ref 8–23)
CO2: 30 mmol/L (ref 22–32)
Calcium: 9.4 mg/dL (ref 8.9–10.3)
Chloride: 98 mmol/L (ref 98–111)
Creatinine: 0.79 mg/dL (ref 0.44–1.00)
GFR, Est AFR Am: >60 mL/min (ref >60)
GFR, Estimated: >60 mL/min (ref >60)
Glucose, Bld: 183 mg/dL — ABNORMAL HIGH (ref 70–99)
Potassium: 3.2 mmol/L — ABNORMAL LOW (ref 3.5–5.1)
Sodium: 138 mmol/L (ref 135–145)
Total Bilirubin: 0.9 mg/dL (ref 0.3–1.2)
Total Protein: 7.3 g/dL (ref 6.5–8.1)

## 2020-08-01 LAB — SAMPLE TO BLOOD BANK

## 2020-08-01 MED ORDER — SODIUM CHLORIDE 0.9 % IV SOLN
Freq: Once | INTRAVENOUS | Status: AC
  Filled 2020-08-01: qty 250

## 2020-08-01 MED ORDER — SODIUM CHLORIDE 0.9 % IV SOLN
40.0000 mg | Freq: Once | INTRAVENOUS | Status: AC
  Filled 2020-08-01: qty 80

## 2020-08-01 MED ORDER — PALONOSETRON HCL INJECTION 0.25 MG/5ML
INTRAVENOUS | Status: AC
  Filled 2020-08-01: qty 5

## 2020-08-01 MED ORDER — PALONOSETRON HCL INJECTION 0.25 MG/5ML
0.2500 mg | Freq: Once | INTRAVENOUS | Status: AC
  Administered 2020-08-01: 0.25 mg via INTRAVENOUS

## 2020-08-01 MED ORDER — HYDROMORPHONE HCL 4 MG PO TABS: 4.0000 mg | ORAL_TABLET | ORAL | 0 refills | Status: DC | PRN

## 2020-08-01 MED ORDER — SODIUM CHLORIDE 0.9 % IV SOLN
40.0000 mg | Freq: Once | INTRAVENOUS | Status: AC
  Filled 2020-08-01: qty 4

## 2020-08-01 NOTE — Progress Notes (Unsigned)
Reviewed platelets of 54 and ANC 1.4 as well as other labs.  Ok to proceed with treatment today   No chemo today per Dr [name].  Patient is still enrolled in hospice and needs to file proper paperwork to [facility] before chemo initiated.  Patient IV dcd and sent home via wc.

## 2020-08-01 NOTE — Progress Notes (Signed)
Hematology and Oncology Follow Up Visit  MARCHELE DECOCK [id number] 21-Aug-1951 69 y.o. 08/01/2020   Principle Diagnosis:  IgG Kappa myeloma -- 1p-, 13q-, 16q- and t(11:14)  Past Therapy: RVD -- start on 05/09/2019 -- d/c revlimid on 06/14/2019 -- d/c on 06/28/2019 S/P Kyphoplasty at L2 XRT to lumbar spine -- completed in [facility] -- startedon 07/12/2019- s/p cycle4 -- d/c on 10/19/2019 due to disease progression  Current Therapy:   Selinexor/Velcade -- start cycle #1 on 02/06/2020 - d/c on 03/03 Kyprolis/Faspro -- start on 10/26/2019 -- s/p cycle #4 -- d/c due to progression Xgeva 120 mg sq q 3 months -- next dose on09/2021 Venetoclax 200 mg po q day -- started on 03/30/2020 -- d/c on 06/14/2020 Melflufen 40 mg iv q month -- start on 08/01/2020 XRT for extramedullary plasmacytoma -- start on 05/30/2020   Interim History:  Ms. Plunk is here today for follow-up.  She actually looks much better than when I last saw her.  She has little more energy.  She is still having problems with pain.  She is on the Duragesic patch at 75 mcg.  She gets Dilaudid for breakthrough pain.  She has been having problems with this vomiting.  I am not sure as to why she is having vomiting, unless it is from radiation although she has not had radiation for a couple weeks.  I told she and her husband to go on to Zofran on a schedule.  I told him to take Zofran 3 times a day whether she is nauseated or not.  Surprisingly, her myeloma studies that we did on her were a little bit better.  Her M spike was 1.9 g/dL.  As such, I think that she still has disease that can respond to treatment.  We had set her up with Melflufen earlier.  She was not able to take it because she was in the hospital.  She would like to have this now.  I think it would be reasonable to try.  She is on hospice right now.  I told she and her husband that they will have to decline hospice benefits for now.  Maybe, palliative care  might be able to be useful.  She is not having diarrhea.  She is not having fever.  There is no obvious bleeding.  Overall, I would say her performance status is ECOG 2.     Medications:  Allergies as of 08/01/2020      Reactions   Metformin Diarrhea, Nausea Only      Medication List       Accurate as of August 01, 2020  2:38 PM. If you have any questions, ask your nurse or doctor.        ciprofloxacin 500 MG tablet Commonly known as: CIPRO Take 1 tablet (500 mg total) by mouth daily with breakfast.   cyclobenzaprine 10 MG tablet Commonly known as: FLEXERIL Take 1 tablet (10 mg total) by mouth 3 (three) times daily as needed for muscle spasms.   dronabinol 2.5 MG capsule Commonly known as: MARINOL Take 1 capsule (2.5 mg total) by mouth 2 (two) times daily before a meal.   Farxiga 10 MG Tabs tablet Generic drug: dapagliflozin propanediol Take 10 mg by mouth 2 (two) times a week.   fentaNYL 75 MCG/HR Commonly known as: [place] 1 patch onto the skin every 3 (three) days.   HYDROmorphone 4 MG tablet Commonly known as: DILAUDID Take 1 tablet (4 mg total) by mouth every  4 (four) hours as needed for severe pain.   latanoprost 0.005 % ophthalmic solution Commonly known as: XALATAN Place 1 drop into both eyes at bedtime.   LORazepam 1 MG tablet Commonly known as: ATIVAN Take 1 tablet (1 mg total) by mouth every 6 (six) hours as needed for anxiety or sleep.   mirtazapine 15 MG tablet Commonly known as: REMERON Take 1 tablet (15 mg total) by mouth at bedtime.   NovoLOG 100 UNIT/ML injection Generic drug: insulin aspart Inject 1.2-25 Units into the skin as directed. PATIENT USES INSULIN PUMP  Patient has a set basal rate of 1.2-1.5u per hour with meal time boluses of up to 25 units   ondansetron 8 MG tablet Commonly known as: ZOFRAN Take 1 tablet (8 mg total) by mouth every 8 (eight) hours as needed for nausea or vomiting.   pantoprazole 40 MG  tablet Commonly known as: PROTONIX Take 1 tablet (40 mg total) by mouth 2 (two) times daily.   PARoxetine 30 MG tablet Commonly known as: PAXIL Take 30 mg by mouth every morning.   polyethylene glycol 17 g packet Commonly known as: MIRALAX / GLYCOLAX Take 17 g by mouth daily as needed for moderate constipation.   senna-docusate 8.6-50 MG tablet Commonly known as: Senokot-S Take 1 tablet by mouth 2 (two) times daily.       Allergies:  Allergies  Allergen Reactions  . Metformin Diarrhea and Nausea Only    Past Medical History, Surgical history, Social history, and Family History were reviewed and updated.  Review of Systems: Review of Systems  Constitutional: Negative.   HENT: Negative.   Eyes: Negative.   Respiratory: Negative.   Cardiovascular: Negative.   Gastrointestinal: Negative.   Genitourinary: Negative.   Musculoskeletal: Negative.   Skin: Negative.   Neurological: Negative.   Endo/Heme/Allergies: Negative.   Psychiatric/Behavioral: Negative.      Physical Exam:  height is 5\' 2"  (1.575 m). Her oral temperature is 97.6 F (36.4 C). Her blood pressure is 100/63 and her pulse is 101 (abnormal). Her respiration is 18 and oxygen saturation is 99%.   Wt Readings from Last 3 Encounters:  07/19/20 142 lb (64.4 kg)  07/03/20 152 lb 5.4 oz (69.1 kg)  06/21/20 155 lb (70.3 kg)    Physical Exam Vitals reviewed.  Constitutional:      Comments: This is a chronic ill-appearing white female.  She is in some distress secondary to pain in the left abdomen.  She looks weak.  She has lost weight.  Her skin has some scattered ecchymoses.  HENT:     Head: Normocephalic and atraumatic.  Eyes:     Pupils: Pupils are equal, round, and reactive to light.  Cardiovascular:     Rate and Rhythm: Normal rate and regular rhythm.     Heart sounds: Normal heart sounds.  Pulmonary:     Effort: Pulmonary effort is normal.     Breath sounds: Normal breath sounds.  Abdominal:      General: Bowel sounds are normal.     Palpations: Abdomen is soft.  Musculoskeletal:        General: No tenderness or deformity. Normal range of motion.     Cervical back: Normal range of motion.  Lymphadenopathy:     Cervical: No cervical adenopathy.  Skin:    General: Skin is warm and dry.     Findings: No erythema or rash.  Neurological:     Mental Status: She is alert and oriented to person,  place, and time.  Psychiatric:        Behavior: Behavior normal.        Thought Content: Thought content normal.        Judgment: Judgment normal.      Lab Results  Component Value Date   WBC 2.2 (L) 08/01/2020   HGB 15.6 (H) 08/01/2020   HCT 46.6 (H) 08/01/2020   MCV 91.7 08/01/2020   PLT 54 (L) 08/01/2020   Lab Results  Component Value Date   FERRITIN 1,339 (H) 03/13/2020   IRON 101 03/13/2020   TIBC 194 (L) 03/13/2020   UIBC 92 (L) 03/13/2020   IRONPCTSAT 52 03/13/2020   Lab Results  Component Value Date   RETICCTPCT 1.2 02/15/2020   RBC 5.08 08/01/2020   Lab Results  Component Value Date   KPAFRELGTCHN 442.6 (H) 07/19/2020   LAMBDASER 2.9 (L) 07/19/2020   KAPLAMBRATIO 152.62 (H) 07/19/2020   Lab Results  Component Value Date   IGGSERUM 2,514 (H) 07/19/2020   IGGSERUM 2,767 (H) 07/19/2020   IGA 8 (L) 07/19/2020   IGA 8 (L) 07/19/2020   IGMSERUM 9 (L) 07/19/2020   IGMSERUM 11 (L) 07/19/2020   Lab Results  Component Value Date   TOTALPROTELP 7.2 07/19/2020   ALBUMINELP 2.8 (L) 07/19/2020   A1GS 0.4 07/19/2020   A2GS 1.1 (H) 07/19/2020   BETS 0.9 07/19/2020   GAMS 2.0 (H) 07/19/2020   MSPIKE 1.9 (H) 07/19/2020   SPEI Comment 03/23/2020     Chemistry      Component Value Date/Time   NA 138 08/01/2020 1145   K 3.2 (L) 08/01/2020 1145   CL 98 08/01/2020 1145   CO2 30 08/01/2020 1145   BUN 12 08/01/2020 1145   CREATININE 0.79 08/01/2020 1145   CREATININE 0.65 01/12/2018 1004      Component Value Date/Time   CALCIUM 9.4 08/01/2020 1145   ALKPHOS 120  08/01/2020 1145   AST 19 08/01/2020 1145   ALT 14 08/01/2020 1145   BILITOT 0.9 08/01/2020 1145       Impression and Plan: Ms. Aull is a very pleasant 69 yo caucasian female with IgG kappa myeloma, with what I would consider to have high risk cytogenetics.  I really think that Melflufen is our last chance of trying to help her.  I do think it would be reasonable.  I would like to think that she will respond.  I noted that her white cell count and platelets are on the low side.  We probably will have to see if we cannot give her OnPro.  They live quite [place] ways away.  We will have to have her blood monitored weekly.  It would not surprise me if we did have to transfuse her.   I realize that Ms. Reynold's status is not the greatest.  However, they just want to try something to help her out.  She has been through so many treatments.  Again she has a difficult chromosomal pattern that probably has led to this myeloma having resilience to treatment.  They understand quite well that Melflufen may not help.  If not, then we will clearly make sure hospice gets back on board.  I spoke to Ms. Nader and her husband about her status.  They certainly are aware that she has really had a tough time and really has had a limited responsiveness to prior treatments.  I spent about 40 minutes with him today.  I know this is quite complicated.  We  just want to give her a chance and hopefully she will improve and have a better quality of life.  We will plan to have her come back in a month for her cycle 2, depending on her status.     [name], MD 8/11/20212:38 PM

## 2020-08-01 NOTE — Patient Instructions (Signed)
Palonosetron Injection What is this medicine? PALONOSETRON (pal oh NOE se tron) is used to prevent nausea and vomiting caused by chemotherapy. It also helps prevent delayed nausea and vomiting that may occur a few days after your treatment. This medicine may be used for other purposes; ask your health care provider or pharmacist if you have questions. COMMON BRAND NAME(S): Aloxi What should I tell my health care provider before I take this medicine? They need to know if you have any of these conditions:  an unusual or allergic reaction to palonosetron, dolasetron, granisetron, ondansetron, other medicines, foods, dyes, or preservatives  pregnant or trying to get pregnant  breast-feeding How should I use this medicine? This medicine is for infusion into a vein. It is given by a health care professional in a hospital or clinic setting. Talk to your pediatrician regarding the use of this medicine in children. While this drug may be prescribed for children as young as 1 month for selected conditions, precautions do apply. Overdosage: If you think you have taken too much of this medicine contact a poison control center or emergency room at once. NOTE: This medicine is only for you. Do not share this medicine with others. What if I miss a dose? This does not apply. What may interact with this medicine?  certain medicines for depression, anxiety, or psychotic disturbances  fentanyl  linezolid  MAOIs like Carbex, Eldepryl, Marplan, Nardil, and Parnate  methylene blue (injected into a vein)  tramadol This list may not describe all possible interactions. Give your health care provider a list of all the medicines, herbs, non-prescription drugs, or dietary supplements you use. Also tell them if you smoke, drink alcohol, or use illegal drugs. Some items may interact with your medicine. What should I watch for while using this medicine? Your condition will be monitored carefully while you are  receiving this medicine. What side effects may I notice from receiving this medicine? Side effects that you should report to your doctor or health care professional as soon as possible:  allergic reactions like skin rash, itching or hives, swelling of the face, lips, or tongue  breathing problems  confusion  dizziness  fast, irregular heartbeat  fever and chills  loss of balance or coordination  seizures  sweating  swelling of the hands and feet  tremors  unusually weak or tired Side effects that usually do not require medical attention (report to your doctor or health care professional if they continue or are bothersome):  constipation or diarrhea  headache This list may not describe all possible side effects. Call your doctor for medical advice about side effects. You may report side effects to FDA at 1-800-FDA-1088. Where should I keep my medicine? This drug is given in a hospital or clinic and will not be stored at home. NOTE: This sheet is a summary. It may not cover all possible information. If you have questions about this medicine, talk to your doctor, pharmacist, or health care provider.  2020 Elsevier/Gold Standard (2013-10-14 10:38:36)

## 2020-08-02 ENCOUNTER — Telehealth: Payer: Self-pay | Admitting: Hematology & Oncology

## 2020-08-02 DIAGNOSIS — M25512 Pain in left shoulder: Secondary | ICD-10-CM | POA: Diagnosis not present

## 2020-08-02 DIAGNOSIS — C9000 Multiple myeloma not having achieved remission: Secondary | ICD-10-CM | POA: Diagnosis not present

## 2020-08-02 DIAGNOSIS — R110 Nausea: Secondary | ICD-10-CM | POA: Diagnosis not present

## 2020-08-02 DIAGNOSIS — Z79899 Other long term (current) drug therapy: Secondary | ICD-10-CM | POA: Diagnosis not present

## 2020-08-02 DIAGNOSIS — Z5189 Encounter for other specified aftercare: Secondary | ICD-10-CM | POA: Diagnosis not present

## 2020-08-02 NOTE — Telephone Encounter (Signed)
I am awaiting info regarding patient and Hospice. Desk Nurse to advise once Hospice calls back either today or tomorrow. 8/12 los

## 2020-08-07 ENCOUNTER — Inpatient Hospital Stay

## 2020-08-07 ENCOUNTER — Other Ambulatory Visit: Payer: Self-pay

## 2020-08-07 VITALS — BP 104/47 | HR 90 | Temp 97.5°F | Resp 16

## 2020-08-07 DIAGNOSIS — R110 Nausea: Secondary | ICD-10-CM | POA: Diagnosis not present

## 2020-08-07 DIAGNOSIS — C9000 Multiple myeloma not having achieved remission: Secondary | ICD-10-CM | POA: Diagnosis not present

## 2020-08-07 DIAGNOSIS — D696 Thrombocytopenia, unspecified: Secondary | ICD-10-CM

## 2020-08-07 DIAGNOSIS — M25579 Pain in unspecified ankle and joints of unspecified foot: Secondary | ICD-10-CM

## 2020-08-07 DIAGNOSIS — M25512 Pain in left shoulder: Secondary | ICD-10-CM | POA: Diagnosis not present

## 2020-08-07 DIAGNOSIS — Z79899 Other long term (current) drug therapy: Secondary | ICD-10-CM | POA: Diagnosis not present

## 2020-08-07 DIAGNOSIS — C9002 Multiple myeloma in relapse: Secondary | ICD-10-CM

## 2020-08-07 DIAGNOSIS — Z5189 Encounter for other specified aftercare: Secondary | ICD-10-CM | POA: Diagnosis not present

## 2020-08-07 LAB — CMP (CANCER CENTER ONLY)
ALT: 12 U/L (ref 0–44)
AST: 18 U/L (ref 15–41)
Albumin: 3.3 g/dL — ABNORMAL LOW (ref 3.5–5.0)
Alkaline Phosphatase: 124 U/L (ref 38–126)
Anion gap: 9 (ref 5–15)
BUN: 11 mg/dL (ref 8–23)
CO2: 28 mmol/L (ref 22–32)
Calcium: 9.4 mg/dL (ref 8.9–10.3)
Chloride: 101 mmol/L (ref 98–111)
Creatinine: 0.82 mg/dL (ref 0.44–1.00)
GFR, Est AFR Am: >60 mL/min (ref >60)
GFR, Estimated: >60 mL/min (ref >60)
Glucose, Bld: 150 mg/dL — ABNORMAL HIGH (ref 70–99)
Potassium: 3.5 mmol/L (ref 3.5–5.1)
Sodium: 138 mmol/L (ref 135–145)
Total Bilirubin: 0.9 mg/dL (ref 0.3–1.2)
Total Protein: 7.1 g/dL (ref 6.5–8.1)

## 2020-08-07 LAB — CBC WITH DIFFERENTIAL (CANCER CENTER ONLY)
Abs Immature Granulocytes: 0.02 10*3/uL (ref 0.00–0.07)
Basophils Absolute: 0 10*3/uL (ref 0.0–0.1)
Basophils Relative: 0 %
Eosinophils Absolute: 0.1 10*3/uL (ref 0.0–0.5)
Eosinophils Relative: 3 %
HCT: 39.5 % (ref 36.0–46.0)
Hemoglobin: 13.5 g/dL (ref 12.0–15.0)
Immature Granulocytes: 1 %
Lymphocytes Relative: 11 %
Lymphs Abs: 0.3 10*3/uL — ABNORMAL LOW (ref 0.7–4.0)
MCH: 30.8 pg (ref 26.0–34.0)
MCHC: 34.2 g/dL (ref 30.0–36.0)
MCV: 90.2 fL (ref 80.0–100.0)
Monocytes Absolute: 0.5 10*3/uL (ref 0.1–1.0)
Monocytes Relative: 16 %
Neutro Abs: 2.1 10*3/uL (ref 1.7–7.7)
Neutrophils Relative %: 69 %
Platelet Count: 55 10*3/uL — ABNORMAL LOW (ref 150–400)
RBC: 4.38 MIL/uL (ref 3.87–5.11)
RDW: 14.7 % (ref 11.5–15.5)
WBC Count: 3 10*3/uL — ABNORMAL LOW (ref 4.0–10.5)
nRBC: 0 % (ref 0.0–0.2)

## 2020-08-07 MED ORDER — HYDROMORPHONE HCL 1 MG/ML IJ SOLN
1.0000 mg | Freq: Once | INTRAMUSCULAR | Status: AC
  Administered 2020-08-07: 1 mg via INTRAVENOUS

## 2020-08-07 MED ORDER — SODIUM CHLORIDE 0.9 % IV SOLN
40.0000 mg | Freq: Once | INTRAVENOUS | Status: AC
  Administered 2020-08-07: 40 mg via INTRAVENOUS
  Filled 2020-08-07: qty 80

## 2020-08-07 MED ORDER — SODIUM CHLORIDE 0.9 % IV SOLN
Freq: Once | INTRAVENOUS | Status: AC
  Filled 2020-08-07: qty 250

## 2020-08-07 MED ORDER — PALONOSETRON HCL INJECTION 0.25 MG/5ML
0.2500 mg | Freq: Once | INTRAVENOUS | Status: AC
  Administered 2020-08-07: 0.25 mg via INTRAVENOUS

## 2020-08-07 MED ORDER — HYDROMORPHONE HCL 1 MG/ML IJ SOLN
INTRAMUSCULAR | Status: AC
  Filled 2020-08-07: qty 1

## 2020-08-07 MED ORDER — SODIUM CHLORIDE 0.9 % IV SOLN
40.0000 mg | Freq: Once | INTRAVENOUS | Status: AC
  Administered 2020-08-07: 40 mg via INTRAVENOUS
  Filled 2020-08-07: qty 4

## 2020-08-07 NOTE — Progress Notes (Signed)
CBC and CMET reviewed with MD, ok to treat despite counts. 

## 2020-08-08 ENCOUNTER — Other Ambulatory Visit: Payer: Self-pay | Admitting: Hematology & Oncology

## 2020-08-08 ENCOUNTER — Telehealth: Payer: Self-pay

## 2020-08-08 NOTE — Telephone Encounter (Signed)
Telephone call to patient to schedule palliative care visit with patient. Patient/family in agreement with home visit on 08/10/20 at 12:30PM

## 2020-08-10 ENCOUNTER — Other Ambulatory Visit: Payer: Self-pay

## 2020-08-10 ENCOUNTER — Other Ambulatory Visit: Payer: No Typology Code available for payment source

## 2020-08-10 ENCOUNTER — Other Ambulatory Visit: Payer: Self-pay | Admitting: *Deleted

## 2020-08-10 ENCOUNTER — Other Ambulatory Visit: Payer: No Typology Code available for payment source | Admitting: *Deleted

## 2020-08-10 ENCOUNTER — Telehealth: Payer: Self-pay | Admitting: *Deleted

## 2020-08-10 DIAGNOSIS — C9000 Multiple myeloma not having achieved remission: Secondary | ICD-10-CM

## 2020-08-10 DIAGNOSIS — Z515 Encounter for palliative care: Secondary | ICD-10-CM

## 2020-08-10 MED ORDER — ONDANSETRON HCL 8 MG PO TABS: 8.0000 mg | ORAL_TABLET | Freq: Three times a day (TID) | ORAL | 2 refills | Status: AC | PRN

## 2020-08-10 MED ORDER — PROCHLORPERAZINE 25 MG RE SUPP
25.0000 mg | Freq: Two times a day (BID) | RECTAL | 1 refills | Status: DC | PRN
Start: 2020-08-10 — End: 2020-08-28

## 2020-08-10 NOTE — Telephone Encounter (Signed)
Call received from [facility] with Palliative Care to notify Dr. [name] that pt has vomited times three today, is having no relief from Zofran and is also having increased pain to left shoulder which is not relieved by Dilaudid 4 mg tablets.  Dr. [name] notified.  Call placed back to patient's husband and patient's husband notified per order of Dr. [name] to increase Dilaudid to 8 mg PO every four hours as needed for pain, keep Duragesic 75 mcg patch on as ordered,  to take Ativan 0.5 mg PO every eight hours as needed for nausea and Compazine supp as ordered to [facility].  Instructed pt.'s husband to contact palliative care or on call physician tomorrow, if patient has not improved.  Pt.'s husband appreciative of assistance and has no further questions at this time.

## 2020-08-13 ENCOUNTER — Telehealth: Payer: Self-pay | Admitting: *Deleted

## 2020-08-13 ENCOUNTER — Telehealth: Payer: Self-pay

## 2020-08-13 NOTE — Telephone Encounter (Signed)
Call placed to pt.'s husband to check on patient's status.  Pt.'s husband states that pt.'s vomiting stopped on Friday evening, appetite remains decreased, pt is lethargic and patient has not taken a pain pill in the past 24 hours.  Pt.'s husband is appreciative of call and states that they will be here for pt.'s appt tomorrow as scheduled.

## 2020-08-13 NOTE — Progress Notes (Signed)
COMMUNITY PALLIATIVE CARE SW NOTE  PATIENT NAME: Vicki Moon DOB: 01/09/1951 MRN: [id number]  PRIMARY CARE PROVIDER: Hall, Vicki Z, MD  RESPONSIBLE PARTY:  Acct ID - Guarantor Home Phone Work Phone Relationship Acct Type  [id number] - Vicki Moon,RE* [phone]  Self P/F     [address], [place], [place] [address]     PLAN OF CARE and INTERVENTIONS:             1. GOALS OF CARE/ ADVANCE CARE PLANNING:  Goal is for patient to remain in her home. Patient is a DNR, form in the home. 2. SOCIAL/EMOTIONAL/SPIRITUAL ASSESSMENT/ INTERVENTIONS:  SW, RN-Vicki Moon and NP-Vicki Moon competed a face-to-face visit with patient at her home. The team spent time with patient's husband initially to obtain a status update on patient, provide education regarding the palliative program and provide support to him. Patient was in bed and team met with her after talking to husband/PCG-Vicki Moon. Vicki Moon was provided education regarding the palliaive care program, services, visit frequencies and how to access support when needed. Vicki Moon provided a verbal consent to ongoing palliative care services. Vicki Moon advised that patient was discharged from hospice last week as she wanted to continue with chemo and radiation treatments. Patient has lost a total of 60 lbs. She is having nausea and vomiting, which is affecting her appetite. She is currently having pain in her left arm at times where she has difficulty breathing. Patient is also having pain in her abdomen and under her breast area. She has pain medication, but is having little results from it. She is sleeping more, her husband assist with personal care needs. Vicki Moon reports that their spirits are up, but admits patient's quality of life has been poor the past few months. The team visited with patient who was in the bed. She reported that she was having pain to her arm that was almost unbearable. The RN and NP advised her they had talked with her husband about the pain and her nausea and  recommendations were made that needed to be discussed with her primary care physician. She was receptive to this. Vicki Moon provided a brief social history on patient:  Patient was born and raised in [place], [place]. She has two years of college. 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 She has been married to her husband Vicki Moon for 51 years and they have three children. Patient is of [employer] faith. SW provided supportive presence, active listening, education, assessed PCG needs and coping, assessment of needs and comfort of patient, reassurance of support. SW to provide ongoing assessment of psychosocial needs and support to patient and PCG.  3. PATIENT/CAREGIVER EDUCATION/ COPING:  Patient is alert to self and situation. She and her husband are in good spirits and remain optimistic that patient's condition will improve. They have strong faith, supportive church community and friend network.  4. PERSONAL EMERGENCY PLAN: 911 can be activated for emergencies. 5. COMMUNITY RESOURCES COORDINATION/ HEALTH CARE NAVIGATION: Patient and her husband have a supportive church. 6. FINANCIAL/LEGAL CONCERNS/INTERVENTIONS: None.     SOCIAL HX:  Social History   Tobacco Use  . Smoking status: Current Some Day Smoker    Packs/day: 1.00    Years: 10.00    Pack years: 10.00    Types: Cigarettes    Last attempt to quit: 02/20/2019    Years since quitting: 1.4  . Smokeless tobacco: Never Used  . Tobacco comment: 1 pack/month   Substance Use Topics  . Alcohol use: No      Alcohol/week: 0.0 standard drinks    CODE STATUS: DNR ADVANCED DIRECTIVES: In progress MOST FORM COMPLETE:  No HOSPICE EDUCATION PROVIDED: No  PPS: Patient can ambulate with assistance. She is alert and oriented x3. She is experiencing nausea, vomiting, weakness and pain. Patient requires assistance with personal care tasks.   Duration of visit and documentation: 60 minutes.  Vicki Lonon, LCSW 

## 2020-08-13 NOTE — Telephone Encounter (Signed)
Left Patient voicemail message to call back in regards to telephone follow-up with Vicki Bruning PA on 08/15/20. Called to review meaningful use questions. TM

## 2020-08-14 ENCOUNTER — Ambulatory Visit (HOSPITAL_BASED_OUTPATIENT_CLINIC_OR_DEPARTMENT_OTHER)
Admission: RE | Admit: 2020-08-14 | Discharge: 2020-08-14 | Disposition: A | Discharge status: Home / Self Care | Payer: Medicare Other | Source: Ambulatory Visit | Attending: Hematology & Oncology | Admitting: Hematology & Oncology

## 2020-08-14 ENCOUNTER — Telehealth: Payer: Self-pay

## 2020-08-14 ENCOUNTER — Inpatient Hospital Stay

## 2020-08-14 ENCOUNTER — Other Ambulatory Visit: Payer: Self-pay | Admitting: *Deleted

## 2020-08-14 ENCOUNTER — Inpatient Hospital Stay (HOSPITAL_BASED_OUTPATIENT_CLINIC_OR_DEPARTMENT_OTHER): Admitting: Hematology & Oncology

## 2020-08-14 ENCOUNTER — Other Ambulatory Visit: Payer: Self-pay

## 2020-08-14 ENCOUNTER — Telehealth: Payer: Self-pay | Admitting: *Deleted

## 2020-08-14 VITALS — BP 98/55 | HR 104 | Temp 98.2°F | Resp 17 | Wt 126.5 lb

## 2020-08-14 DIAGNOSIS — C9000 Multiple myeloma not having achieved remission: Secondary | ICD-10-CM | POA: Insufficient documentation

## 2020-08-14 DIAGNOSIS — Z5189 Encounter for other specified aftercare: Secondary | ICD-10-CM | POA: Diagnosis not present

## 2020-08-14 DIAGNOSIS — C9002 Multiple myeloma in relapse: Secondary | ICD-10-CM

## 2020-08-14 DIAGNOSIS — D61818 Other pancytopenia: Secondary | ICD-10-CM

## 2020-08-14 DIAGNOSIS — M84422A Pathological fracture, left humerus, initial encounter for fracture: Secondary | ICD-10-CM | POA: Diagnosis not present

## 2020-08-14 DIAGNOSIS — Z79899 Other long term (current) drug therapy: Secondary | ICD-10-CM | POA: Diagnosis not present

## 2020-08-14 DIAGNOSIS — G893 Neoplasm related pain (acute) (chronic): Secondary | ICD-10-CM

## 2020-08-14 DIAGNOSIS — M25512 Pain in left shoulder: Secondary | ICD-10-CM | POA: Diagnosis not present

## 2020-08-14 DIAGNOSIS — R110 Nausea: Secondary | ICD-10-CM | POA: Diagnosis not present

## 2020-08-14 LAB — CBC WITH DIFFERENTIAL (CANCER CENTER ONLY)
Abs Immature Granulocytes: 0.01 10*3/uL (ref 0.00–0.07)
Basophils Absolute: 0 10*3/uL (ref 0.0–0.1)
Basophils Relative: 2 %
Eosinophils Absolute: 0 10*3/uL (ref 0.0–0.5)
Eosinophils Relative: 3 %
HCT: 35.6 % — ABNORMAL LOW (ref 36.0–46.0)
Hemoglobin: 12.3 g/dL (ref 12.0–15.0)
Immature Granulocytes: 2 %
Lymphocytes Relative: 8 %
Lymphs Abs: 0.1 10*3/uL — ABNORMAL LOW (ref 0.7–4.0)
MCH: 30.6 pg (ref 26.0–34.0)
MCHC: 34.6 g/dL (ref 30.0–36.0)
MCV: 88.6 fL (ref 80.0–100.0)
Monocytes Absolute: 0 10*3/uL — ABNORMAL LOW (ref 0.1–1.0)
Monocytes Relative: 6 %
Neutro Abs: 0.5 10*3/uL — ABNORMAL LOW (ref 1.7–7.7)
Neutrophils Relative %: 79 %
Platelet Count: 31 10*3/uL — ABNORMAL LOW (ref 150–400)
RBC: 4.02 MIL/uL (ref 3.87–5.11)
RDW: 14.6 % (ref 11.5–15.5)
WBC Count: 0.7 10*3/uL — CL (ref 4.0–10.5)
nRBC: 0 % (ref 0.0–0.2)

## 2020-08-14 LAB — CMP (CANCER CENTER ONLY)
ALT: 16 U/L (ref 0–44)
AST: 19 U/L (ref 15–41)
Albumin: 3.4 g/dL — ABNORMAL LOW (ref 3.5–5.0)
Alkaline Phosphatase: 158 U/L — ABNORMAL HIGH (ref 38–126)
Anion gap: 9 (ref 5–15)
BUN: 11 mg/dL (ref 8–23)
CO2: 26 mmol/L (ref 22–32)
Calcium: 9.2 mg/dL (ref 8.9–10.3)
Chloride: 101 mmol/L (ref 98–111)
Creatinine: 0.75 mg/dL (ref 0.44–1.00)
GFR, Est AFR Am: >60 mL/min (ref >60)
GFR, Estimated: >60 mL/min (ref >60)
Glucose, Bld: 188 mg/dL — ABNORMAL HIGH (ref 70–99)
Potassium: 3.7 mmol/L (ref 3.5–5.1)
Sodium: 136 mmol/L (ref 135–145)
Total Bilirubin: 1 mg/dL (ref 0.3–1.2)
Total Protein: 7.2 g/dL (ref 6.5–8.1)

## 2020-08-14 LAB — SAMPLE TO BLOOD BANK

## 2020-08-14 MED ORDER — DENOSUMAB 120 MG/1.7ML ~~LOC~~ SOLN: 120.0000 mg | Freq: Once | SUBCUTANEOUS | Status: DC

## 2020-08-14 MED ORDER — KETOROLAC TROMETHAMINE 15 MG/ML IJ SOLN
30.0000 mg | Freq: Once | INTRAMUSCULAR | Status: AC
  Administered 2020-08-14: 30 mg via INTRAVENOUS
  Filled 2020-08-14: qty 2

## 2020-08-14 MED ORDER — SODIUM CHLORIDE 0.9 % IV SOLN
150.0000 mg | Freq: Once | INTRAVENOUS | Status: AC
  Administered 2020-08-14: 150 mg via INTRAVENOUS
  Filled 2020-08-14: qty 5

## 2020-08-14 MED ORDER — HYDROMORPHONE HCL 4 MG/ML IJ SOLN
INTRAMUSCULAR | Status: AC
  Filled 2020-08-14: qty 1

## 2020-08-14 MED ORDER — DARBEPOETIN ALFA 300 MCG/0.6ML IJ SOSY: 300.0000 ug | PREFILLED_SYRINGE | Freq: Once | INTRAMUSCULAR | Status: DC

## 2020-08-14 MED ORDER — PALONOSETRON HCL INJECTION 0.25 MG/5ML
INTRAVENOUS | Status: AC
  Filled 2020-08-14: qty 5

## 2020-08-14 MED ORDER — KETOROLAC TROMETHAMINE 15 MG/ML IJ SOLN
INTRAMUSCULAR | Status: AC
  Filled 2020-08-14: qty 2

## 2020-08-14 MED ORDER — HYDROMORPHONE HCL 4 MG/ML IJ SOLN
4.0000 mg | INTRAMUSCULAR | Status: DC | PRN
  Administered 2020-08-14: 4 mg via INTRAVENOUS

## 2020-08-14 MED ORDER — FENTANYL 75 MCG/HR TD PT72: 1.0000 | MEDICATED_PATCH | TRANSDERMAL | 0 refills | Status: DC

## 2020-08-14 MED ORDER — TBO-FILGRASTIM 480 MCG/0.8ML ~~LOC~~ SOSY
480.0000 ug | PREFILLED_SYRINGE | Freq: Once | SUBCUTANEOUS | Status: AC
  Administered 2020-08-14: 480 ug via SUBCUTANEOUS
  Filled 2020-08-14: qty 0.8

## 2020-08-14 MED ORDER — PALONOSETRON HCL INJECTION 0.25 MG/5ML
0.2500 mg | Freq: Once | INTRAVENOUS | Status: AC
  Administered 2020-08-14: 0.25 mg via INTRAVENOUS

## 2020-08-14 MED ORDER — SODIUM CHLORIDE 0.9 % IV SOLN
Freq: Once | INTRAVENOUS | Status: DC | PRN
  Filled 2020-08-14: qty 250

## 2020-08-14 MED ORDER — FAMOTIDINE IN NACL 20-0.9 MG/50ML-% IV SOLN: 20.0000 mg | Freq: Once | INTRAVENOUS | Status: DC

## 2020-08-14 MED ORDER — FAMOTIDINE IN NACL 20-0.9 MG/50ML-% IV SOLN
INTRAVENOUS | Status: AC
  Filled 2020-08-14: qty 50

## 2020-08-14 NOTE — Telephone Encounter (Signed)
Left voicemail message to call back in regards to telephone visit with Vicki Bruning PA on 08/15/20 at 2:30pm. Called to review meaningful use questions.

## 2020-08-14 NOTE — Progress Notes (Signed)
Hematology and Oncology Follow Up Visit  Vicki Moon [id number] 01-Oct-1951 69 y.o. 08/14/2020   Principle Diagnosis:  IgG Kappa myeloma -- 1p-, 13q-, 16q- and t(11:14)  Past Therapy: RVD -- start on 05/09/2019 -- d/c revlimid on 06/14/2019 -- d/c on 06/28/2019 S/P Kyphoplasty at L2 XRT to lumbar spine -- completed in [place] [facility] -- startedon 07/12/2019- s/p cycle4 -- d/c on 10/19/2019 due to disease progression  Current Therapy:   Selinexor/Velcade -- start cycle #1 on 02/06/2020 - d/c on 03/03 Kyprolis/Faspro -- start on 10/26/2019 -- s/p cycle #4 -- d/c due to progression Xgeva 120 mg sq q 3 months -- next dose on09/2021 Venetoclax 200 mg po q day -- started on 03/30/2020 -- d/c on 06/14/2020 Melflufen 40 mg iv q month -- start on 08/01/2020 XRT for extramedullary plasmacytoma -- start on 05/30/2020   Interim History:  [name] is here today for follow-up.  The problem now is that she is having a lot of pain in the left shoulder.  She denies any falling.  She there is been no trauma.  We went ahead and got a plain film of her left shoulder.  I guess I really should not be surprised that there is a pathologic fracture in the neck of the humerus.  I am sure this is from myeloma.  She is not a surgical candidate by any means.  We will see if radiation oncology can help with this.  We actually will have her see her radiation oncologist up in [facility] tomorrow.  We called their office today.  She just has had issues with nausea and vomiting.  Again, she does has a very low threshold for tolerating any treatment.  She is not eating all that much.  She is not having diarrhea.  She is having no problems with fever.  Her white cell count of 0.7 today.  We will have to give her a dose of Neupogen.  There has been no bleeding.  Her platelet count 31,000.  After 1 dose of Melflufen, is really hard to know whether or not there is any response.  Our goal is to make sure she  has some kind of quality of life.  Right now, her quality of life is not that great.  I would have to say that currently, her performance status is no better than ECOG 3.   Medications:  Allergies as of 08/14/2020      Reactions   Metformin Diarrhea, Nausea Only      Medication List       Accurate as of August 14, 2020  4:29 PM. If you have any questions, ask your nurse or doctor.        ciprofloxacin 500 MG tablet Commonly known as: CIPRO Take 1 tablet (500 mg total) by mouth daily with breakfast.   cyclobenzaprine 10 MG tablet Commonly known as: FLEXERIL Take 1 tablet (10 mg total) by mouth 3 (three) times daily as needed for muscle spasms.   dronabinol 2.5 MG capsule Commonly known as: MARINOL Take 1 capsule (2.5 mg total) by mouth 2 (two) times daily before a meal.   Farxiga 10 MG Tabs tablet Generic drug: dapagliflozin propanediol Take 10 mg by mouth 2 (two) times a week.   fentaNYL 75 MCG/HR Commonly known as: [place] 1 patch onto the skin every 3 (three) days.   HYDROmorphone 4 MG tablet Commonly known as: DILAUDID Take 1 tablet (4 mg total) by mouth every 4 (four) hours as needed  for severe pain.   latanoprost 0.005 % ophthalmic solution Commonly known as: XALATAN Place 1 drop into both eyes at bedtime.   LORazepam 1 MG tablet Commonly known as: ATIVAN Take 1 tablet (1 mg total) by mouth every 6 (six) hours as needed for anxiety or sleep.   mirtazapine 15 MG tablet Commonly known as: REMERON Take 1 tablet (15 mg total) by mouth at bedtime.   NovoLOG 100 UNIT/ML injection Generic drug: insulin aspart Inject 1.2-25 Units into the skin as directed. PATIENT USES INSULIN PUMP  Patient has a set basal rate of 1.2-1.5u per hour with meal time boluses of up to 25 units   ondansetron 8 MG tablet Commonly known as: ZOFRAN Take 1 tablet (8 mg total) by mouth every 8 (eight) hours as needed for nausea or vomiting.   pantoprazole 40 MG tablet Commonly  known as: PROTONIX Take 1 tablet (40 mg total) by mouth 2 (two) times daily.   PARoxetine 30 MG tablet Commonly known as: PAXIL Take 30 mg by mouth every morning.   polyethylene glycol 17 g packet Commonly known as: MIRALAX / GLYCOLAX Take 17 g by mouth daily as needed for moderate constipation.   prochlorperazine 25 MG suppository Commonly known as: COMPAZINE Place 1 suppository (25 mg total) rectally every 12 (twelve) hours as needed for nausea or vomiting.   senna-docusate 8.6-50 MG tablet Commonly known as: Senokot-S Take 1 tablet by mouth 2 (two) times daily.       Allergies:  Allergies  Allergen Reactions  . Metformin Diarrhea and Nausea Only    Past Medical History, Surgical history, Social history, and Family History were reviewed and updated.  Review of Systems: Review of Systems  Constitutional: Negative.   HENT: Negative.   Eyes: Negative.   Respiratory: Negative.   Cardiovascular: Negative.   Gastrointestinal: Negative.   Genitourinary: Negative.   Musculoskeletal: Negative.   Skin: Negative.   Neurological: Negative.   Endo/Heme/Allergies: Negative.   Psychiatric/Behavioral: Negative.      Physical Exam:  weight is 126 lb 8 oz (57.4 kg). Her oral temperature is 98.2 F (36.8 C). Her blood pressure is 98/55 (abnormal) and her pulse is 104 (abnormal). Her respiration is 17 and oxygen saturation is 97%.   Wt Readings from Last 3 Encounters:  08/14/20 126 lb 8 oz (57.4 kg)  07/19/20 142 lb (64.4 kg)  07/03/20 152 lb 5.4 oz (69.1 kg)    Physical Exam Vitals reviewed.  Constitutional:      Comments: This is a chronic ill-appearing white female.  She is in some distress secondary to pain in the left abdomen.  She looks weak.  She has lost weight.  Her skin has some scattered ecchymoses.  HENT:     Head: Normocephalic and atraumatic.  Eyes:     Pupils: Pupils are equal, round, and reactive to light.  Cardiovascular:     Rate and Rhythm: Normal  rate and regular rhythm.     Heart sounds: Normal heart sounds.  Pulmonary:     Effort: Pulmonary effort is normal.     Breath sounds: Normal breath sounds.  Abdominal:     General: Bowel sounds are normal.     Palpations: Abdomen is soft.  Musculoskeletal:        General: No tenderness or deformity. Normal range of motion.     Cervical back: Normal range of motion.     Comments: She has markedly decreased range of motion of the left shoulder.  There  is tenderness to palpation in the left upper arm.  Lymphadenopathy:     Cervical: No cervical adenopathy.  Skin:    General: Skin is warm and dry.     Findings: No erythema or rash.  Neurological:     Mental Status: She is alert and oriented to person, place, and time.  Psychiatric:        Behavior: Behavior normal.        Thought Content: Thought content normal.        Judgment: Judgment normal.     Lab Results  Component Value Date   WBC 0.7 (LL) 08/14/2020   HGB 12.3 08/14/2020   HCT 35.6 (L) 08/14/2020   MCV 88.6 08/14/2020   PLT 31 (L) 08/14/2020   Lab Results  Component Value Date   FERRITIN 1,339 (H) 03/13/2020   IRON 101 03/13/2020   TIBC 194 (L) 03/13/2020   UIBC 92 (L) 03/13/2020   IRONPCTSAT 52 03/13/2020   Lab Results  Component Value Date   RETICCTPCT 1.2 02/15/2020   RBC 4.02 08/14/2020   Lab Results  Component Value Date   KPAFRELGTCHN 442.6 (H) 07/19/2020   LAMBDASER 2.9 (L) 07/19/2020   KAPLAMBRATIO 152.62 (H) 07/19/2020   Lab Results  Component Value Date   IGGSERUM 2,514 (H) 07/19/2020   IGGSERUM 2,767 (H) 07/19/2020   IGA 8 (L) 07/19/2020   IGA 8 (L) 07/19/2020   IGMSERUM 9 (L) 07/19/2020   IGMSERUM 11 (L) 07/19/2020   Lab Results  Component Value Date   TOTALPROTELP 7.2 07/19/2020   ALBUMINELP 2.8 (L) 07/19/2020   A1GS 0.4 07/19/2020   A2GS 1.1 (H) 07/19/2020   BETS 0.9 07/19/2020   GAMS 2.0 (H) 07/19/2020   MSPIKE 1.9 (H) 07/19/2020   SPEI Comment 03/23/2020     Chemistry       Component Value Date/Time   NA 136 08/14/2020 1053   K 3.7 08/14/2020 1053   CL 101 08/14/2020 1053   CO2 26 08/14/2020 1053   BUN 11 08/14/2020 1053   CREATININE 0.75 08/14/2020 1053   CREATININE 0.65 01/12/2018 1004      Component Value Date/Time   CALCIUM 9.2 08/14/2020 1053   ALKPHOS 158 (H) 08/14/2020 1053   AST 19 08/14/2020 1053   ALT 16 08/14/2020 1053   BILITOT 1.0 08/14/2020 1053       Impression and Plan: Ms. Feldt is a very pleasant 69 yo caucasian female with IgG kappa myeloma, with what I would consider to have high risk cytogenetics.  I and really not surprised about the results of the x-ray.  Again, she has an incredibly aggressive myeloma.  Hopefully, radiation will be able to help.  This is strictly palliative radiation.  She still is not willing to go to Hospice.  She wants to keep trying.  Again, I does not know how much her body is going be able to tolerate.  Hopefully, but we will give her in the office today will help her.  We will plan for another follow-up in a couple weeks.  We will certainly see her sooner if needed.  I suppose I would not be surprised if she ended up back in the hospital with a problem.   I spent about 45 minutes with them today.  I know this is quite complicated.  We just want to give her a chance and hopefully she will improve and have a better quality of life.  We will plan to have her come back in  a couple of weeks for her cycle 2, depending on her status.     [name], MD 8/24/20214:29 PM

## 2020-08-14 NOTE — Telephone Encounter (Signed)
Dr. [name] notified of platelet count-31, ANC-0.5 and WBC-0.7.  No new orders received at this time.

## 2020-08-15 ENCOUNTER — Other Ambulatory Visit: Payer: Self-pay | Admitting: *Deleted

## 2020-08-15 ENCOUNTER — Telehealth: Payer: Self-pay | Admitting: Urology

## 2020-08-15 ENCOUNTER — Ambulatory Visit
Admission: RE | Admit: 2020-08-15 | Discharge: 2020-08-15 | Disposition: A | Discharge status: Home / Self Care | Payer: Medicare Other | Source: Ambulatory Visit | Attending: Urology | Admitting: Urology

## 2020-08-15 DIAGNOSIS — C7951 Secondary malignant neoplasm of bone: Secondary | ICD-10-CM | POA: Diagnosis not present

## 2020-08-15 DIAGNOSIS — M25579 Pain in unspecified ankle and joints of unspecified foot: Secondary | ICD-10-CM

## 2020-08-15 DIAGNOSIS — Z510 Encounter for antineoplastic radiation therapy: Secondary | ICD-10-CM | POA: Diagnosis not present

## 2020-08-15 DIAGNOSIS — C9000 Multiple myeloma not having achieved remission: Secondary | ICD-10-CM

## 2020-08-15 DIAGNOSIS — M8448XA Pathological fracture, other site, initial encounter for fracture: Secondary | ICD-10-CM

## 2020-08-15 DIAGNOSIS — C9002 Multiple myeloma in relapse: Secondary | ICD-10-CM

## 2020-08-15 LAB — IGG, IGA, IGM
IgA: 8 mg/dL — ABNORMAL LOW (ref 64–422)
IgG (Immunoglobin G), Serum: 2115 mg/dL — ABNORMAL HIGH (ref 586–1602)
IgM (Immunoglobulin M), Srm: 10 mg/dL — ABNORMAL LOW (ref 26–217)

## 2020-08-15 LAB — KAPPA/LAMBDA LIGHT CHAINS
Kappa free light chain: 248.5 mg/L — ABNORMAL HIGH (ref 3.3–19.4)
Kappa, lambda light chain ratio: 92.04 — ABNORMAL HIGH (ref 0.26–1.65)
Lambda free light chains: 2.7 mg/L — ABNORMAL LOW (ref 5.7–26.3)

## 2020-08-15 NOTE — Telephone Encounter (Signed)
I attempted to reach the patient by phone today to conduct her routine 1 month follow-up visit but did not get an answer and had to leave a message on her voicemail.  I advised her to return my call if she has any questions or concerns related to her previous radiation.  I will await her return call.  Nicholos Johns, MMS, PA-C [place] [facility] [place]: [phone]  Fax: [phone]

## 2020-08-15 NOTE — Progress Notes (Unsigned)
a 

## 2020-08-15 NOTE — Progress Notes (Signed)
  Radiation Oncology         [phone] ________________________________  Name: Vicki Moon MRN: [id number]  Date: 07/13/2020  DOB: 08-May-1951  End of Treatment Note  Diagnosis:   69 yo woman with myeloma involving paraaortic adenopathy and a pelvic mass     Indication for treatment:  Palliation       Radiation treatment dates:   07/09/20 - 07/13/20  Site/dose:   The involved abdominal lymph nodes and right hip/pelvic mass were treated to 20 Gy in 5 fractions of 4 Gy each.  Beams/energy:   6X, 10X, 15X VMAT  Narrative: The patient tolerated radiation treatment relatively well without any ill side effects.     Plan: The patient has completed radiation treatment. The patient will return to radiation oncology clinic for routine followup in one month. I advised her to call or return sooner if she has any questions or concerns related to her recovery or treatment. ________________________________  Sheral Apley. Tammi Klippel, M.D.

## 2020-08-16 DIAGNOSIS — Z510 Encounter for antineoplastic radiation therapy: Secondary | ICD-10-CM | POA: Diagnosis not present

## 2020-08-16 DIAGNOSIS — C9000 Multiple myeloma not having achieved remission: Secondary | ICD-10-CM | POA: Diagnosis not present

## 2020-08-16 LAB — IMMUNOFIXATION REFLEX, SERUM
IgA: 9 mg/dL — ABNORMAL LOW (ref 64–422)
IgG (Immunoglobin G), Serum: 1962 mg/dL — ABNORMAL HIGH (ref 586–1602)
IgM (Immunoglobulin M), Srm: 9 mg/dL — ABNORMAL LOW (ref 26–217)

## 2020-08-16 LAB — PROTEIN ELECTROPHORESIS, SERUM, WITH REFLEX
A/G Ratio: 0.7 (ref 0.7–1.7)
Albumin ELP: 2.9 g/dL (ref 2.9–4.4)
Alpha-1-Globulin: 0.4 g/dL (ref 0.0–0.4)
Alpha-2-Globulin: 1 g/dL (ref 0.4–1.0)
Beta Globulin: 0.8 g/dL (ref 0.7–1.3)
Gamma Globulin: 1.7 g/dL (ref 0.4–1.8)
Globulin, Total: 3.9 g/dL (ref 2.2–3.9)
M-Spike, %: 1.5 g/dL — ABNORMAL HIGH
SPEP Interpretation: 0
Total Protein ELP: 6.8 g/dL (ref 6.0–8.5)

## 2020-08-17 ENCOUNTER — Telehealth: Payer: Self-pay | Admitting: *Deleted

## 2020-08-17 DIAGNOSIS — Z510 Encounter for antineoplastic radiation therapy: Secondary | ICD-10-CM | POA: Diagnosis not present

## 2020-08-17 DIAGNOSIS — C7951 Secondary malignant neoplasm of bone: Secondary | ICD-10-CM | POA: Diagnosis not present

## 2020-08-17 DIAGNOSIS — C9000 Multiple myeloma not having achieved remission: Secondary | ICD-10-CM | POA: Diagnosis not present

## 2020-08-17 NOTE — Telephone Encounter (Addendum)
Patient's husband aware of results  ----- Message from [name], MD sent at 08/16/2020  5:08 PM EDT ----- Call - the myeloma is better on the labs!!!  [facility]

## 2020-08-20 DIAGNOSIS — C9000 Multiple myeloma not having achieved remission: Secondary | ICD-10-CM | POA: Diagnosis not present

## 2020-08-20 DIAGNOSIS — Z510 Encounter for antineoplastic radiation therapy: Secondary | ICD-10-CM | POA: Diagnosis not present

## 2020-08-21 ENCOUNTER — Inpatient Hospital Stay

## 2020-08-21 ENCOUNTER — Other Ambulatory Visit: Payer: Self-pay | Admitting: *Deleted

## 2020-08-21 ENCOUNTER — Telehealth: Payer: Self-pay | Admitting: *Deleted

## 2020-08-21 ENCOUNTER — Other Ambulatory Visit: Payer: Self-pay

## 2020-08-21 ENCOUNTER — Other Ambulatory Visit: Payer: Self-pay | Admitting: Family

## 2020-08-21 VITALS — BP 122/50 | HR 64 | Temp 97.2°F | Resp 20

## 2020-08-21 DIAGNOSIS — R110 Nausea: Secondary | ICD-10-CM | POA: Diagnosis not present

## 2020-08-21 DIAGNOSIS — Z5189 Encounter for other specified aftercare: Secondary | ICD-10-CM | POA: Diagnosis not present

## 2020-08-21 DIAGNOSIS — D696 Thrombocytopenia, unspecified: Secondary | ICD-10-CM

## 2020-08-21 DIAGNOSIS — Z510 Encounter for antineoplastic radiation therapy: Secondary | ICD-10-CM | POA: Diagnosis not present

## 2020-08-21 DIAGNOSIS — C9002 Multiple myeloma in relapse: Secondary | ICD-10-CM

## 2020-08-21 DIAGNOSIS — Z79899 Other long term (current) drug therapy: Secondary | ICD-10-CM | POA: Diagnosis not present

## 2020-08-21 DIAGNOSIS — C7951 Secondary malignant neoplasm of bone: Secondary | ICD-10-CM | POA: Diagnosis not present

## 2020-08-21 DIAGNOSIS — C9000 Multiple myeloma not having achieved remission: Secondary | ICD-10-CM | POA: Diagnosis not present

## 2020-08-21 DIAGNOSIS — M25512 Pain in left shoulder: Secondary | ICD-10-CM | POA: Diagnosis not present

## 2020-08-21 LAB — CBC WITH DIFFERENTIAL (CANCER CENTER ONLY)
Abs Immature Granulocytes: 0 10*3/uL (ref 0.00–0.07)
Basophils Absolute: 0 10*3/uL (ref 0.0–0.1)
Basophils Relative: 0 %
Eosinophils Absolute: 0 10*3/uL (ref 0.0–0.5)
Eosinophils Relative: 0 %
HCT: 31.5 % — ABNORMAL LOW (ref 36.0–46.0)
Hemoglobin: 10.9 g/dL — ABNORMAL LOW (ref 12.0–15.0)
Lymphocytes Relative: 25 %
Lymphs Abs: 0.1 10*3/uL — ABNORMAL LOW (ref 0.7–4.0)
MCH: 30.4 pg (ref 26.0–34.0)
MCHC: 34.6 g/dL (ref 30.0–36.0)
MCV: 87.7 fL (ref 80.0–100.0)
Monocytes Absolute: 0.1 10*3/uL (ref 0.1–1.0)
Monocytes Relative: 20 %
Neutro Abs: 0.3 10*3/uL — CL (ref 1.7–7.7)
Neutrophils Relative %: 55 %
Platelet Count: 8 10*3/uL — CL (ref 150–400)
RBC: 3.59 MIL/uL — ABNORMAL LOW (ref 3.87–5.11)
RDW: 14.6 % (ref 11.5–15.5)
WBC Count: 0.5 10*3/uL — CL (ref 4.0–10.5)
nRBC: 0 % (ref 0.0–0.2)

## 2020-08-21 LAB — CMP (CANCER CENTER ONLY)
ALT: 10 U/L (ref 0–44)
AST: 14 U/L — ABNORMAL LOW (ref 15–41)
Albumin: 3.5 g/dL (ref 3.5–5.0)
Alkaline Phosphatase: 131 U/L — ABNORMAL HIGH (ref 38–126)
Anion gap: 9 (ref 5–15)
BUN: 11 mg/dL (ref 8–23)
CO2: 26 mmol/L (ref 22–32)
Calcium: 9.1 mg/dL (ref 8.9–10.3)
Chloride: 100 mmol/L (ref 98–111)
Creatinine: 0.67 mg/dL (ref 0.44–1.00)
GFR, Est AFR Am: >60 mL/min (ref >60)
GFR, Estimated: >60 mL/min (ref >60)
Glucose, Bld: 149 mg/dL — ABNORMAL HIGH (ref 70–99)
Potassium: 3.5 mmol/L (ref 3.5–5.1)
Sodium: 135 mmol/L (ref 135–145)
Total Bilirubin: 0.9 mg/dL (ref 0.3–1.2)
Total Protein: 6.9 g/dL (ref 6.5–8.1)

## 2020-08-21 MED ORDER — HYDROMORPHONE HCL 4 MG/ML IJ SOLN
4.0000 mg | Freq: Once | INTRAMUSCULAR | Status: AC
  Administered 2020-08-21: 4 mg via INTRAVENOUS

## 2020-08-21 MED ORDER — TBO-FILGRASTIM 480 MCG/0.8ML ~~LOC~~ SOSY
480.0000 ug | PREFILLED_SYRINGE | Freq: Once | SUBCUTANEOUS | Status: AC
  Administered 2020-08-21: 480 ug via SUBCUTANEOUS
  Filled 2020-08-21: qty 0.8

## 2020-08-21 MED ORDER — HYDROMORPHONE HCL 4 MG/ML IJ SOLN
INTRAMUSCULAR | Status: AC
  Filled 2020-08-21: qty 1

## 2020-08-21 MED ORDER — LORAZEPAM 2 MG/ML IJ SOLN
INTRAMUSCULAR | Status: AC
  Filled 2020-08-21: qty 1

## 2020-08-21 MED ORDER — SODIUM CHLORIDE 0.9% IV SOLUTION
250.0000 mL | Freq: Once | INTRAVENOUS | Status: AC
  Administered 2020-08-21: 250 mL via INTRAVENOUS
  Filled 2020-08-21: qty 250

## 2020-08-21 NOTE — Patient Instructions (Signed)
Platelet Count Test Why am I having this test? Platelets are specialized cells that help the blood clot. When you get a tissue injury like a cut, platelets gather at the site of the injury to stop the bleeding. You may have a platelet count test:  If you have symptoms that may be related to excess bleeding or delayed blood clotting, such as: ? A rash of pinprick-sized red and purple dots on the skin (petechiae). These are small collections of blood (hemorrhages) in the skin. ? Heavy menstrual bleeding.  To help monitor treatment for: ? Thrombocytopenia. This is a condition in which you have a low platelet count. ? Bone marrow failure. What is being tested? This test measures how many platelets you have within a specific amount (volume) of blood. What kind of sample is taken?  A blood sample is required for this test. It is usually collected by inserting a needle into a blood vessel or by sticking a finger with a small needle. Tell a health care provider about:  Any allergies you have.  All medicines you are taking, including vitamins, herbs, eye drops, creams, and over-the-counter medicines.  Any blood disorders you have.  Any surgeries you have had.  Any medical conditions you have.  Whether you are pregnant or may be pregnant. How are the results reported? Your test results will be reported as a value that indicates how many platelets are in the blood volume. This will be given as platelets per cubic millimeter (mm3) of blood. Your health care provider will compare your results to normal ranges that were established after testing a large group of people (reference ranges). Reference ranges may vary among labs and hospitals. For this test, common reference ranges are:  Adult or elderly: 150,000-400,000/mm3.  Child: 150,000-400,000/mm3.  Infant: 200,000-475,000/mm3.  Premature infant: 100,000-300,000/mm3.  Newborn: 150,000-300,000/mm3. What do the results mean? A result  that is within your reference range is considered normal, meaning that you have a normal amount of platelets in your blood. A result that is higher than your reference range means that you have too many platelets in your blood. This may mean that you have:  Certain types of cancer, such as leukemia or lymphoma.  A condition in which the bone marrow produces excess amounts of all cell types, including platelets (polycythemia vera).  A condition that can occur after surgery to remove the spleen (post-splenectomy syndrome).  Rheumatoid arthritis.  Anemia due to lack of iron (iron-deficiency anemia). A result that is lower than your reference range means that you have too few platelets in your blood. This may mean that you have:  A condition in which the spleen breaks down platelets faster than normal (hypersplenism).  A hemorrhage somewhere in your body.  Low platelet count due to your body's disease-fighting system attacking your platelets (immune thrombocytopenia).  Cancer. Chemotherapy treatments for cancers such as leukemia can also cause low platelet count.  A rare, serious form of thrombocytopenia that causes blood clots (thrombotic thrombocytopenia).  HELLP syndrome, a disorder of pregnancy that causes high blood pressure and other serious problems.  Certain disorders that are passed from parent to child (inherited) that cause a low platelet count.  A condition in which the proteins that control blood clotting are overactive, causing abnormal clotting processes to occur (disseminated intravascular coagulation, DIC).  A disease that causes long-term inflammation and pain in many parts of the body (systemic lupus erythematosus, SLE).  Certain types of anemia, such as pernicious anemia or hemolytic anemia.    Infection. Talk with your health care provider about what your results mean. Questions to ask your health care provider Ask your health care provider, or the department that  is doing the test:  When will my results be ready?  How will I get my results?  What are my treatment options?  What other tests do I need?  What are my next steps? Summary  Platelets are specialized cells that help the blood clot. When you get a tissue injury like a cut, platelets gather at the site of the injury to stop the bleeding.  This test measures how many platelets you have within a specific amount (volume) of blood.  Talk with your health care provider about what your results mean. This information is not intended to replace advice given to you by your health care provider. Make sure you discuss any questions you have with your health care provider. Document Revised: 08/31/2017 Document Reviewed: 08/31/2017 Elsevier Patient Education  2020 [employer]  

## 2020-08-21 NOTE — Telephone Encounter (Signed)
Dr. [name] notified of WBC-0.5 and platelet count-8.  Orders received for pt to get one unit of platelets today per Dr. [name].

## 2020-08-22 LAB — PREPARE PLATELET PHERESIS: Unit division: 0

## 2020-08-22 LAB — BPAM PLATELET PHERESIS
Blood Product Expiration Date: 202109012359
ISSUE DATE / TIME: 202108311135
Unit Type and Rh: 7300

## 2020-08-23 ENCOUNTER — Other Ambulatory Visit: Payer: Self-pay | Admitting: Hematology & Oncology

## 2020-08-28 ENCOUNTER — Telehealth: Payer: Self-pay | Admitting: *Deleted

## 2020-08-28 ENCOUNTER — Inpatient Hospital Stay (HOSPITAL_BASED_OUTPATIENT_CLINIC_OR_DEPARTMENT_OTHER): Payer: Medicare Other | Admitting: Hematology & Oncology

## 2020-08-28 ENCOUNTER — Encounter: Payer: Self-pay | Admitting: Hematology & Oncology

## 2020-08-28 ENCOUNTER — Inpatient Hospital Stay: Payer: Medicare Other | Attending: Hematology & Oncology

## 2020-08-28 ENCOUNTER — Inpatient Hospital Stay: Payer: Medicare Other

## 2020-08-28 ENCOUNTER — Other Ambulatory Visit: Payer: Self-pay

## 2020-08-28 ENCOUNTER — Other Ambulatory Visit: Payer: Self-pay | Admitting: *Deleted

## 2020-08-28 VITALS — BP 120/47 | HR 79 | Resp 17

## 2020-08-28 DIAGNOSIS — C9000 Multiple myeloma not having achieved remission: Secondary | ICD-10-CM

## 2020-08-28 DIAGNOSIS — R110 Nausea: Secondary | ICD-10-CM

## 2020-08-28 DIAGNOSIS — R112 Nausea with vomiting, unspecified: Secondary | ICD-10-CM

## 2020-08-28 DIAGNOSIS — D61818 Other pancytopenia: Secondary | ICD-10-CM | POA: Diagnosis not present

## 2020-08-28 DIAGNOSIS — C9002 Multiple myeloma in relapse: Secondary | ICD-10-CM | POA: Diagnosis not present

## 2020-08-28 DIAGNOSIS — G893 Neoplasm related pain (acute) (chronic): Secondary | ICD-10-CM

## 2020-08-28 DIAGNOSIS — M8448XA Pathological fracture, other site, initial encounter for fracture: Secondary | ICD-10-CM

## 2020-08-28 LAB — CMP (CANCER CENTER ONLY)
ALT: 10 U/L (ref 0–44)
AST: 15 U/L (ref 15–41)
Albumin: 3.5 g/dL (ref 3.5–5.0)
Alkaline Phosphatase: 165 U/L — ABNORMAL HIGH (ref 38–126)
Anion gap: 12 (ref 5–15)
BUN: 11 mg/dL (ref 8–23)
CO2: 24 mmol/L (ref 22–32)
Calcium: 9.2 mg/dL (ref 8.9–10.3)
Chloride: 100 mmol/L (ref 98–111)
Creatinine: 0.68 mg/dL (ref 0.44–1.00)
GFR, Est AFR Am: >60 mL/min (ref >60)
GFR, Estimated: >60 mL/min (ref >60)
Glucose, Bld: 151 mg/dL — ABNORMAL HIGH (ref 70–99)
Potassium: 3.4 mmol/L — ABNORMAL LOW (ref 3.5–5.1)
Sodium: 136 mmol/L (ref 135–145)
Total Bilirubin: 1.1 mg/dL (ref 0.3–1.2)
Total Protein: 6.7 g/dL (ref 6.5–8.1)

## 2020-08-28 LAB — CBC WITH DIFFERENTIAL (CANCER CENTER ONLY)
Abs Immature Granulocytes: 0 10*3/uL (ref 0.00–0.07)
Basophils Absolute: 0 10*3/uL (ref 0.0–0.1)
Basophils Relative: 1 %
Eosinophils Absolute: 0 10*3/uL (ref 0.0–0.5)
Eosinophils Relative: 1 %
HCT: 27.7 % — ABNORMAL LOW (ref 36.0–46.0)
Hemoglobin: 9.5 g/dL — ABNORMAL LOW (ref 12.0–15.0)
Immature Granulocytes: 0 %
Lymphocytes Relative: 13 %
Lymphs Abs: 0.1 10*3/uL — ABNORMAL LOW (ref 0.7–4.0)
MCH: 30.3 pg (ref 26.0–34.0)
MCHC: 34.3 g/dL (ref 30.0–36.0)
MCV: 88.2 fL (ref 80.0–100.0)
Monocytes Absolute: 0.1 10*3/uL (ref 0.1–1.0)
Monocytes Relative: 20 %
Neutro Abs: 0.5 10*3/uL — ABNORMAL LOW (ref 1.7–7.7)
Neutrophils Relative %: 65 %
Platelet Count: 12 10*3/uL — ABNORMAL LOW (ref 150–400)
RBC: 3.14 MIL/uL — ABNORMAL LOW (ref 3.87–5.11)
RDW: 14.4 % (ref 11.5–15.5)
WBC Count: 0.7 10*3/uL — CL (ref 4.0–10.5)
nRBC: 0 % (ref 0.0–0.2)

## 2020-08-28 LAB — SAMPLE TO BLOOD BANK

## 2020-08-28 MED ORDER — ONDANSETRON HCL 4 MG/2ML IJ SOLN
INTRAMUSCULAR | Status: AC
  Filled 2020-08-28: qty 2

## 2020-08-28 MED ORDER — SODIUM CHLORIDE 0.9 % IV SOLN
Freq: Once | INTRAVENOUS | Status: AC
  Filled 2020-08-28: qty 250

## 2020-08-28 MED ORDER — HYDROMORPHONE HCL 4 MG PO TABS
4.0000 mg | ORAL_TABLET | ORAL | 0 refills | Status: AC | PRN
Start: 2020-08-28 — End: ?

## 2020-08-28 MED ORDER — HYDROMORPHONE HCL 4 MG/ML IJ SOLN
INTRAMUSCULAR | Status: AC
Start: 2020-08-28 — End: ?
  Filled 2020-08-28: qty 1

## 2020-08-28 MED ORDER — PROCHLORPERAZINE 25 MG RE SUPP: 25.0000 mg | Freq: Two times a day (BID) | RECTAL | 3 refills | Status: AC | PRN

## 2020-08-28 MED ORDER — ONDANSETRON HCL 4 MG/2ML IJ SOLN
4.0000 mg | Freq: Once | INTRAMUSCULAR | Status: AC
  Administered 2020-08-28: 4 mg via INTRAVENOUS

## 2020-08-28 MED ORDER — FENTANYL 75 MCG/HR TD PT72: 1.0000 | MEDICATED_PATCH | TRANSDERMAL | 0 refills | Status: DC

## 2020-08-28 MED ORDER — HYDROMORPHONE HCL 4 MG/ML IJ SOLN
4.0000 mg | Freq: Once | INTRAMUSCULAR | Status: AC
  Administered 2020-08-28: 4 mg via INTRAVENOUS

## 2020-08-28 NOTE — Telephone Encounter (Signed)
Call placed to [place] with [facility] to re-enroll pt with hospice per order of Dr. [name].

## 2020-08-28 NOTE — Progress Notes (Signed)
Hematology and Oncology Follow Up Visit  Vicki Moon [id number] 26-Oct-1951 69 y.o. 08/28/2020   Principle Diagnosis:  IgG Kappa myeloma -- 1p-, 13q-, 16q- and t(11:14)  Past Therapy: RVD -- start on 05/09/2019 -- d/c revlimid on 06/14/2019 -- d/c on 06/28/2019 S/P Kyphoplasty at L2 XRT to lumbar spine -- completed in [facility] -- startedon 07/12/2019- s/p cycle4 -- d/c on 10/19/2019 due to disease progression  Current Therapy:   Selinexor/Velcade -- start cycle #1 on 02/06/2020 - d/c on 03/03 Kyprolis/Faspro -- start on 10/26/2019 -- s/p cycle #4 -- d/c due to progression Xgeva 120 mg sq q 3 months -- next dose on09/2021 Venetoclax 200 mg po q day -- started on 03/30/2020 -- d/c on 06/14/2020 Melflufen 40 mg iv q month -- start on 08/01/2020 -- d/c on 08/28/2020 XRT for extramedullary plasmacytoma -- start on 05/30/2020   Interim History:  Vicki Moon is here today for follow-up.  Unfortunately, I really think that she has had enough of treatment.  She does has no quality of life.  She is nauseated all the time.  She is not eating.  She is still having pain.  I know that she has tried everything.  She just has a very low tolerance.  She just really is not able to eat that much.  Again, she says she gets nauseated.  We will try to take off her Duragesic patches.  Maybe, this is what is causing a lot of the nausea.  I talked about getting suppositories for pain.  This is certainly feasible but most pharmacies just will not.  Dilaudid suppositories.  I think that we have to get her back onto hospice.  I think this will be very helpful for her and her husband.  She is trying to make it another month so that her grandchild will be 49-year-old.  She did have the radiation to the left shoulder.  This is helped a little bit.  She has had no bleeding.  Overall, her performance status is ECOG 3-4.   Medications:  Allergies as of 08/28/2020      Reactions   Metformin  Diarrhea, Nausea Only      Medication List       Accurate as of August 28, 2020  1:26 PM. If you have any questions, ask your nurse or doctor.        STOP taking these medications   ciprofloxacin 500 MG tablet Commonly known as: CIPRO Stopped by: [name], MD     TAKE these medications   cyclobenzaprine 10 MG tablet Commonly known as: FLEXERIL Take 1 tablet (10 mg total) by mouth 3 (three) times daily as needed for muscle spasms.   dronabinol 2.5 MG capsule Commonly known as: MARINOL Take 1 capsule (2.5 mg total) by mouth 2 (two) times daily before a meal.   Farxiga 10 MG Tabs tablet Generic drug: dapagliflozin propanediol Take 10 mg by mouth 2 (two) times a week.   fentaNYL 75 MCG/HR Commonly known as: [place] 1 patch onto the skin every 3 (three) days.   HYDROmorphone 4 MG tablet Commonly known as: DILAUDID Take 1 tablet (4 mg total) by mouth every 4 (four) hours as needed for severe pain.   latanoprost 0.005 % ophthalmic solution Commonly known as: XALATAN Place 1 drop into both eyes at bedtime.   LORazepam 1 MG tablet Commonly known as: ATIVAN Take 1 tablet (1 mg total) by mouth every 6 (six) hours as needed for anxiety  or sleep.   mirtazapine 15 MG tablet Commonly known as: REMERON Take 1 tablet (15 mg total) by mouth at bedtime.   NovoLOG 100 UNIT/ML injection Generic drug: insulin aspart Inject 1.2-25 Units into the skin as directed. PATIENT USES INSULIN PUMP  Patient has a set basal rate of 1.2-1.5u per hour with meal time boluses of up to 25 units   ondansetron 8 MG tablet Commonly known as: ZOFRAN Take 1 tablet (8 mg total) by mouth every 8 (eight) hours as needed for nausea or vomiting.   pantoprazole 40 MG tablet Commonly known as: PROTONIX Take 1 tablet (40 mg total) by mouth 2 (two) times daily.   PARoxetine 30 MG tablet Commonly known as: PAXIL Take 30 mg by mouth every morning.   polyethylene glycol 17 g  packet Commonly known as: MIRALAX / GLYCOLAX Take 17 g by mouth daily as needed for moderate constipation.   prochlorperazine 25 MG suppository Commonly known as: COMPAZINE Place 1 suppository (25 mg total) rectally every 12 (twelve) hours as needed for nausea or vomiting.   senna-docusate 8.6-50 MG tablet Commonly known as: Senokot-S Take 1 tablet by mouth 2 (two) times daily.       Allergies:  Allergies  Allergen Reactions  . Metformin Diarrhea and Nausea Only    Past Medical History, Surgical history, Social history, and Family History were reviewed and updated.  Review of Systems: Review of Systems  Constitutional: Negative.   HENT: Negative.   Eyes: Negative.   Respiratory: Negative.   Cardiovascular: Negative.   Gastrointestinal: Negative.   Genitourinary: Negative.   Musculoskeletal: Negative.   Skin: Negative.   Neurological: Negative.   Endo/Heme/Allergies: Negative.   Psychiatric/Behavioral: Negative.      Physical Exam:  height is 5\' 2"  (1.575 m) and weight is 123 lb (55.8 kg). Her oral temperature is 98.6 F (37 C). Her blood pressure is 87/51 (abnormal) and her pulse is 88. Her respiration is 18 and oxygen saturation is 100%.   Wt Readings from Last 3 Encounters:  08/28/20 123 lb (55.8 kg)  08/14/20 126 lb 8 oz (57.4 kg)  07/19/20 142 lb (64.4 kg)    Physical Exam Vitals reviewed.  Constitutional:      Comments: This is a chronic ill-appearing white female.  She is in some distress secondary to pain in the left abdomen.  She looks weak.  She has lost weight.  Her skin has some scattered ecchymoses.  HENT:     Head: Normocephalic and atraumatic.  Eyes:     Pupils: Pupils are equal, round, and reactive to light.  Cardiovascular:     Rate and Rhythm: Normal rate and regular rhythm.     Heart sounds: Normal heart sounds.  Pulmonary:     Effort: Pulmonary effort is normal.     Breath sounds: Normal breath sounds.  Abdominal:     General: Bowel  sounds are normal.     Palpations: Abdomen is soft.  Musculoskeletal:        General: No tenderness or deformity. Normal range of motion.     Cervical back: Normal range of motion.     Comments: She has markedly decreased range of motion of the left shoulder.  There is tenderness to palpation in the left upper arm.  Lymphadenopathy:     Cervical: No cervical adenopathy.  Skin:    General: Skin is warm and dry.     Findings: No erythema or rash.  Neurological:     Mental  Status: She is alert and oriented to person, place, and time.  Psychiatric:        Behavior: Behavior normal.        Thought Content: Thought content normal.        Judgment: Judgment normal.     Lab Results  Component Value Date   WBC 0.7 (LL) 08/28/2020   HGB 9.5 (L) 08/28/2020   HCT 27.7 (L) 08/28/2020   MCV 88.2 08/28/2020   PLT 12 (L) 08/28/2020   Lab Results  Component Value Date   FERRITIN 1,339 (H) 03/13/2020   IRON 101 03/13/2020   TIBC 194 (L) 03/13/2020   UIBC 92 (L) 03/13/2020   IRONPCTSAT 52 03/13/2020   Lab Results  Component Value Date   RETICCTPCT 1.2 02/15/2020   RBC 3.14 (L) 08/28/2020   Lab Results  Component Value Date   KPAFRELGTCHN 248.5 (H) 08/14/2020   LAMBDASER 2.7 (L) 08/14/2020   KAPLAMBRATIO 92.04 (H) 08/14/2020   Lab Results  Component Value Date   IGGSERUM 1,962 (H) 08/14/2020   IGA 9 (L) 08/14/2020   IGMSERUM 9 (L) 08/14/2020   Lab Results  Component Value Date   TOTALPROTELP 6.8 08/14/2020   ALBUMINELP 2.9 08/14/2020   A1GS 0.4 08/14/2020   A2GS 1.0 08/14/2020   BETS 0.8 08/14/2020   GAMS 1.7 08/14/2020   MSPIKE 1.5 (H) 08/14/2020   SPEI Comment 03/23/2020     Chemistry      Component Value Date/Time   NA 136 08/28/2020 1131   K 3.4 (L) 08/28/2020 1131   CL 100 08/28/2020 1131   CO2 24 08/28/2020 1131   BUN 11 08/28/2020 1131   CREATININE 0.68 08/28/2020 1131   CREATININE 0.65 01/12/2018 1004      Component Value Date/Time   CALCIUM 9.2  08/28/2020 1131   ALKPHOS 165 (H) 08/28/2020 1131   AST 15 08/28/2020 1131   ALT 10 08/28/2020 1131   BILITOT 1.1 08/28/2020 1131       Impression and Plan: Ms. Mentor is a very pleasant 18 yo caucasian female with IgG kappa myeloma, with what I would consider to have high risk cytogenetics.  I have to commend her for trying treatment.  Again I do still see that is going to be of value.  Her performance status continues to decline.  Hopefully, hospice of [facility] will be able to step back in to help her out.  I just wanted to be comfortable.  I hate that she has to travel down here all the time.  I know it is a long way of her to go.  If we can just keep her home that would be a whole lot easier for her and her family.  I hate that we will not see her again.  I know that she was an inspiration to [place] all.  She fought hard.  She did everything we asked her to do.  She had great support from her husband.  I just feel bad for him.    [name], MD 9/7/20211:26 PM

## 2020-08-28 NOTE — Progress Notes (Signed)
Patient receiving IVF, states she wasn't feeling good. Emesis bag provided. Verbal order for zofran 4mg  IV received from [place].

## 2020-08-28 NOTE — Telephone Encounter (Signed)
Dr. [name] notified of platelet count-12, WBC-0.7 and ANC-0.5.  No new orders received at this time.

## 2020-08-28 NOTE — Progress Notes (Signed)
COMMUNITY PALLIATIVE CARE RN NOTE  PATIENT NAME: Vicki Moon DOB: 27-Jul-1951 MRN: [id number]  PRIMARY CARE PROVIDER: [name], MD  RESPONSIBLE PARTY:  Acct ID - Guarantor Home Phone Work Phone Relationship Acct Type  [id number] Vicki Moon, Vicki Moon* [phone]  Self P/F     [address], [place], [place] [address]   Covid-19 Pre-screening Negative  PLAN OF CARE and INTERVENTION:  1. ADVANCE CARE PLANNING/GOALS OF CARE: Goal is for patient to remain at home with her husband and continue chemotherapy. 2. PATIENT/CAREGIVER EDUCATION: Explained Palliative care services, symptom management, pain management, safe mobility 3. DISEASE STATUS: Joint visit made with Palliative care SW, M. Lonon. Met with patient and her husband in their home. Initially receiving health history from husband as patient was lying down in bed asleep. Husband states that she sleeps most of the time. Her is c/o severe pain in her left shoulder. It is ok at rest, but hurts with any movement. Pain has been worsening over the past week. She is currently on Dilaudid for pain 46m every 4 hours for pain and a 75 mcg Fentanyl patch. One of her biggest issues is nausea/vomiting. Husband states that she has vomited 3 times today. She is currently taking Zofran prn but he does not feel that it is very effective. He says that she often vomits after attempting to take her medications. Recommended that he try giving her the medication crushed in applesauce of pudding. She was recently discharged from hospice services as patient wanted to continue to receiving chemotherapy treatments. She is ambulatory and able to shower herself using the shower chair. Husband prepares meals, but she is only able to take small sips of liquids or broth. We then met with patient in her room. She was sleeping soundly but able to arouse with verbal stimulation. She is alert and oriented and able to engage in appropriate conversation. Grimacing noted when she tried to  turn over in bed d/t left shoulder pain. She reports always feeling nauseated. Her husband did show me a bottle of Ativan, stating that she received this from the ER but has not utilized this. Advised will speak with MD regarding this medication and indications of use, as it may be helpful with treating her nausea. I contacted Dr. [facility] and spoke with his nurse. Advised of shoulder pain and ongoing nausea/vomiting. She has a scheduled appointment at his office on Tuesday. RN to speak with MD and then contact husband directly with instructions. Husband and patient agreeable to future visits by Palliative care. Will continue to monitor.  HISTORY OF PRESENT ILLNESS: This is a 69yo female with a diagnosis of multiple myeloma. Palliative care team has been asked to follow patient for additional support. Will visit patient monthly and PRN.   CODE STATUS: DNR  ADVANCED DIRECTIVES: Y MOST FORM: no PPS: 50%   PHYSICAL EXAM:   LUNGS: clear to auscultation  CARDIAC: Cor RRR EXTREMITIES: No edema SKIN: Exposed skin is dry and intact  NEURO: Alert and oriented x 4, increased generalized weakness, ambulatory   (Duration of visit and documentation 90 minutes)   MDaryl Eastern RN BSN

## 2020-08-29 DIAGNOSIS — N1330 Unspecified hydronephrosis: Secondary | ICD-10-CM | POA: Diagnosis not present

## 2020-08-29 DIAGNOSIS — C7951 Secondary malignant neoplasm of bone: Secondary | ICD-10-CM | POA: Diagnosis not present

## 2020-08-29 DIAGNOSIS — E119 Type 2 diabetes mellitus without complications: Secondary | ICD-10-CM | POA: Diagnosis not present

## 2020-08-29 DIAGNOSIS — H409 Unspecified glaucoma: Secondary | ICD-10-CM | POA: Diagnosis not present

## 2020-08-29 DIAGNOSIS — K219 Gastro-esophageal reflux disease without esophagitis: Secondary | ICD-10-CM | POA: Diagnosis not present

## 2020-08-29 DIAGNOSIS — D630 Anemia in neoplastic disease: Secondary | ICD-10-CM | POA: Diagnosis not present

## 2020-08-29 DIAGNOSIS — R112 Nausea with vomiting, unspecified: Secondary | ICD-10-CM | POA: Diagnosis not present

## 2020-08-29 DIAGNOSIS — M5126 Other intervertebral disc displacement, lumbar region: Secondary | ICD-10-CM | POA: Diagnosis not present

## 2020-08-29 DIAGNOSIS — C9000 Multiple myeloma not having achieved remission: Secondary | ICD-10-CM | POA: Diagnosis not present

## 2020-08-29 LAB — KAPPA/LAMBDA LIGHT CHAINS
Kappa free light chain: 171.8 mg/L — ABNORMAL HIGH (ref 3.3–19.4)
Kappa, lambda light chain ratio: 47.72 — ABNORMAL HIGH (ref 0.26–1.65)
Lambda free light chains: 3.6 mg/L — ABNORMAL LOW (ref 5.7–26.3)

## 2020-08-29 LAB — IGG, IGA, IGM
IgA: 8 mg/dL — ABNORMAL LOW (ref 64–422)
IgG (Immunoglobin G), Serum: 1536 mg/dL (ref 586–1602)
IgM (Immunoglobulin M), Srm: 22 mg/dL — ABNORMAL LOW (ref 26–217)

## 2020-08-29 LAB — LACTATE DEHYDROGENASE: LDH: 230 U/L — ABNORMAL HIGH (ref 98–192)

## 2020-08-30 DIAGNOSIS — R112 Nausea with vomiting, unspecified: Secondary | ICD-10-CM | POA: Diagnosis not present

## 2020-08-30 DIAGNOSIS — N1330 Unspecified hydronephrosis: Secondary | ICD-10-CM | POA: Diagnosis not present

## 2020-08-30 DIAGNOSIS — E119 Type 2 diabetes mellitus without complications: Secondary | ICD-10-CM | POA: Diagnosis not present

## 2020-08-30 DIAGNOSIS — C9000 Multiple myeloma not having achieved remission: Secondary | ICD-10-CM | POA: Diagnosis not present

## 2020-08-30 DIAGNOSIS — D630 Anemia in neoplastic disease: Secondary | ICD-10-CM | POA: Diagnosis not present

## 2020-08-30 DIAGNOSIS — C7951 Secondary malignant neoplasm of bone: Secondary | ICD-10-CM | POA: Diagnosis not present

## 2020-08-30 LAB — PROTEIN ELECTROPHORESIS, SERUM, WITH REFLEX
A/G Ratio: 0.9 (ref 0.7–1.7)
Albumin ELP: 3 g/dL (ref 2.9–4.4)
Alpha-1-Globulin: 0.4 g/dL (ref 0.0–0.4)
Alpha-2-Globulin: 0.9 g/dL (ref 0.4–1.0)
Beta Globulin: 0.8 g/dL (ref 0.7–1.3)
Gamma Globulin: 1.3 g/dL (ref 0.4–1.8)
Globulin, Total: 3.4 g/dL (ref 2.2–3.9)
M-Spike, %: 1.2 g/dL — ABNORMAL HIGH
SPEP Interpretation: 0
Total Protein ELP: 6.4 g/dL (ref 6.0–8.5)

## 2020-08-30 LAB — IMMUNOFIXATION REFLEX, SERUM
IgA: 7 mg/dL — ABNORMAL LOW (ref 64–422)
IgG (Immunoglobin G), Serum: 1556 mg/dL (ref 586–1602)
IgM (Immunoglobulin M), Srm: 20 mg/dL — ABNORMAL LOW (ref 26–217)

## 2020-08-31 DIAGNOSIS — N1330 Unspecified hydronephrosis: Secondary | ICD-10-CM | POA: Diagnosis not present

## 2020-08-31 DIAGNOSIS — C9000 Multiple myeloma not having achieved remission: Secondary | ICD-10-CM | POA: Diagnosis not present

## 2020-08-31 DIAGNOSIS — D630 Anemia in neoplastic disease: Secondary | ICD-10-CM | POA: Diagnosis not present

## 2020-08-31 DIAGNOSIS — R112 Nausea with vomiting, unspecified: Secondary | ICD-10-CM | POA: Diagnosis not present

## 2020-08-31 DIAGNOSIS — E119 Type 2 diabetes mellitus without complications: Secondary | ICD-10-CM | POA: Diagnosis not present

## 2020-08-31 DIAGNOSIS — C7951 Secondary malignant neoplasm of bone: Secondary | ICD-10-CM | POA: Diagnosis not present

## 2020-09-03 ENCOUNTER — Telehealth: Payer: Self-pay | Admitting: Hematology & Oncology

## 2020-09-03 NOTE — Telephone Encounter (Signed)
I spoke with patient regarding appointments that were scheduled for 9/14.  These were old appointments that I have cancelled based on the 9/7 los from Dr [name] OK per [facility] to cancel these appointments Patient was in complete agreement with these appointments being cancelled

## 2020-09-04 ENCOUNTER — Inpatient Hospital Stay: Payer: Medicare Other | Admitting: Hematology & Oncology

## 2020-09-04 ENCOUNTER — Inpatient Hospital Stay: Payer: Medicare Other

## 2020-09-07 DIAGNOSIS — D630 Anemia in neoplastic disease: Secondary | ICD-10-CM | POA: Diagnosis not present

## 2020-09-07 DIAGNOSIS — N1330 Unspecified hydronephrosis: Secondary | ICD-10-CM | POA: Diagnosis not present

## 2020-09-07 DIAGNOSIS — C7951 Secondary malignant neoplasm of bone: Secondary | ICD-10-CM | POA: Diagnosis not present

## 2020-09-07 DIAGNOSIS — E119 Type 2 diabetes mellitus without complications: Secondary | ICD-10-CM | POA: Diagnosis not present

## 2020-09-07 DIAGNOSIS — C9000 Multiple myeloma not having achieved remission: Secondary | ICD-10-CM | POA: Diagnosis not present

## 2020-09-07 DIAGNOSIS — R112 Nausea with vomiting, unspecified: Secondary | ICD-10-CM | POA: Diagnosis not present

## 2020-09-08 DIAGNOSIS — R112 Nausea with vomiting, unspecified: Secondary | ICD-10-CM | POA: Diagnosis not present

## 2020-09-08 DIAGNOSIS — N1330 Unspecified hydronephrosis: Secondary | ICD-10-CM | POA: Diagnosis not present

## 2020-09-08 DIAGNOSIS — C7951 Secondary malignant neoplasm of bone: Secondary | ICD-10-CM | POA: Diagnosis not present

## 2020-09-08 DIAGNOSIS — E119 Type 2 diabetes mellitus without complications: Secondary | ICD-10-CM | POA: Diagnosis not present

## 2020-09-08 DIAGNOSIS — D630 Anemia in neoplastic disease: Secondary | ICD-10-CM | POA: Diagnosis not present

## 2020-09-08 DIAGNOSIS — C9000 Multiple myeloma not having achieved remission: Secondary | ICD-10-CM | POA: Diagnosis not present

## 2020-09-11 ENCOUNTER — Inpatient Hospital Stay: Payer: Medicare Other

## 2020-09-19 DIAGNOSIS — C9000 Multiple myeloma not having achieved remission: Secondary | ICD-10-CM | POA: Diagnosis not present

## 2020-09-19 DIAGNOSIS — N1330 Unspecified hydronephrosis: Secondary | ICD-10-CM | POA: Diagnosis not present

## 2020-09-19 DIAGNOSIS — C7951 Secondary malignant neoplasm of bone: Secondary | ICD-10-CM | POA: Diagnosis not present

## 2020-09-19 DIAGNOSIS — D630 Anemia in neoplastic disease: Secondary | ICD-10-CM | POA: Diagnosis not present

## 2020-09-19 DIAGNOSIS — E119 Type 2 diabetes mellitus without complications: Secondary | ICD-10-CM | POA: Diagnosis not present

## 2020-09-19 DIAGNOSIS — R112 Nausea with vomiting, unspecified: Secondary | ICD-10-CM | POA: Diagnosis not present

## 2020-09-21 DIAGNOSIS — D630 Anemia in neoplastic disease: Secondary | ICD-10-CM | POA: Diagnosis not present

## 2020-09-21 DIAGNOSIS — N1330 Unspecified hydronephrosis: Secondary | ICD-10-CM | POA: Diagnosis not present

## 2020-09-21 DIAGNOSIS — M5126 Other intervertebral disc displacement, lumbar region: Secondary | ICD-10-CM | POA: Diagnosis not present

## 2020-09-21 DIAGNOSIS — C9000 Multiple myeloma not having achieved remission: Secondary | ICD-10-CM | POA: Diagnosis not present

## 2020-09-21 DIAGNOSIS — H409 Unspecified glaucoma: Secondary | ICD-10-CM | POA: Diagnosis not present

## 2020-09-21 DIAGNOSIS — C7951 Secondary malignant neoplasm of bone: Secondary | ICD-10-CM | POA: Diagnosis not present

## 2020-09-21 DIAGNOSIS — K219 Gastro-esophageal reflux disease without esophagitis: Secondary | ICD-10-CM | POA: Diagnosis not present

## 2020-09-21 DIAGNOSIS — E119 Type 2 diabetes mellitus without complications: Secondary | ICD-10-CM | POA: Diagnosis not present

## 2020-09-21 DIAGNOSIS — R112 Nausea with vomiting, unspecified: Secondary | ICD-10-CM | POA: Diagnosis not present

## 2020-09-24 DIAGNOSIS — D630 Anemia in neoplastic disease: Secondary | ICD-10-CM | POA: Diagnosis not present

## 2020-09-24 DIAGNOSIS — C9000 Multiple myeloma not having achieved remission: Secondary | ICD-10-CM | POA: Diagnosis not present

## 2020-09-24 DIAGNOSIS — R112 Nausea with vomiting, unspecified: Secondary | ICD-10-CM | POA: Diagnosis not present

## 2020-09-24 DIAGNOSIS — C7951 Secondary malignant neoplasm of bone: Secondary | ICD-10-CM | POA: Diagnosis not present

## 2020-09-24 DIAGNOSIS — N1330 Unspecified hydronephrosis: Secondary | ICD-10-CM | POA: Diagnosis not present

## 2020-09-24 DIAGNOSIS — E119 Type 2 diabetes mellitus without complications: Secondary | ICD-10-CM | POA: Diagnosis not present

## 2020-09-26 DIAGNOSIS — C7951 Secondary malignant neoplasm of bone: Secondary | ICD-10-CM | POA: Diagnosis not present

## 2020-09-26 DIAGNOSIS — N1330 Unspecified hydronephrosis: Secondary | ICD-10-CM | POA: Diagnosis not present

## 2020-09-26 DIAGNOSIS — R112 Nausea with vomiting, unspecified: Secondary | ICD-10-CM | POA: Diagnosis not present

## 2020-09-26 DIAGNOSIS — E119 Type 2 diabetes mellitus without complications: Secondary | ICD-10-CM | POA: Diagnosis not present

## 2020-09-26 DIAGNOSIS — D630 Anemia in neoplastic disease: Secondary | ICD-10-CM | POA: Diagnosis not present

## 2020-09-26 DIAGNOSIS — C9000 Multiple myeloma not having achieved remission: Secondary | ICD-10-CM | POA: Diagnosis not present

## 2020-09-27 DIAGNOSIS — C7951 Secondary malignant neoplasm of bone: Secondary | ICD-10-CM | POA: Diagnosis not present

## 2020-09-27 DIAGNOSIS — N1330 Unspecified hydronephrosis: Secondary | ICD-10-CM | POA: Diagnosis not present

## 2020-09-27 DIAGNOSIS — C9000 Multiple myeloma not having achieved remission: Secondary | ICD-10-CM | POA: Diagnosis not present

## 2020-09-27 DIAGNOSIS — E119 Type 2 diabetes mellitus without complications: Secondary | ICD-10-CM | POA: Diagnosis not present

## 2020-09-27 DIAGNOSIS — R112 Nausea with vomiting, unspecified: Secondary | ICD-10-CM | POA: Diagnosis not present

## 2020-09-27 DIAGNOSIS — D630 Anemia in neoplastic disease: Secondary | ICD-10-CM | POA: Diagnosis not present

## 2020-09-28 ENCOUNTER — Other Ambulatory Visit: Payer: Self-pay | Admitting: *Deleted

## 2020-09-28 ENCOUNTER — Telehealth: Payer: Self-pay | Admitting: *Deleted

## 2020-09-28 DIAGNOSIS — D630 Anemia in neoplastic disease: Secondary | ICD-10-CM | POA: Diagnosis not present

## 2020-09-28 DIAGNOSIS — N1330 Unspecified hydronephrosis: Secondary | ICD-10-CM | POA: Diagnosis not present

## 2020-09-28 DIAGNOSIS — C9002 Multiple myeloma in relapse: Secondary | ICD-10-CM

## 2020-09-28 DIAGNOSIS — R112 Nausea with vomiting, unspecified: Secondary | ICD-10-CM

## 2020-09-28 DIAGNOSIS — C7951 Secondary malignant neoplasm of bone: Secondary | ICD-10-CM | POA: Diagnosis not present

## 2020-09-28 DIAGNOSIS — C9000 Multiple myeloma not having achieved remission: Secondary | ICD-10-CM | POA: Diagnosis not present

## 2020-09-28 DIAGNOSIS — E119 Type 2 diabetes mellitus without complications: Secondary | ICD-10-CM | POA: Diagnosis not present

## 2020-09-28 MED ORDER — PROMETHAZINE HCL 25 MG RE SUPP: 25.0000 mg | Freq: Four times a day (QID) | RECTAL | 2 refills | Status: AC | PRN

## 2020-09-28 NOTE — Telephone Encounter (Signed)
Received call from [place] [place]ospice nurse, stating,"patient has been nauseated with vomiting for weeks. Zofran and Compazine suppositories are not working for her. She can't hold anything down and she is miserable. Would Dr. [name] try something new for nausea? She isn't sleeping either. Per Dr. [name], call in Phenergan 25 mg suppositories one rectally every six hours as needed for nausea and vomiting. It was sent to [facility]. Cyril Mourning will check on Vicki Moon again on Monday.

## 2020-10-01 DIAGNOSIS — R112 Nausea with vomiting, unspecified: Secondary | ICD-10-CM | POA: Diagnosis not present

## 2020-10-01 DIAGNOSIS — C7951 Secondary malignant neoplasm of bone: Secondary | ICD-10-CM | POA: Diagnosis not present

## 2020-10-01 DIAGNOSIS — E119 Type 2 diabetes mellitus without complications: Secondary | ICD-10-CM | POA: Diagnosis not present

## 2020-10-01 DIAGNOSIS — N1330 Unspecified hydronephrosis: Secondary | ICD-10-CM | POA: Diagnosis not present

## 2020-10-01 DIAGNOSIS — C9000 Multiple myeloma not having achieved remission: Secondary | ICD-10-CM | POA: Diagnosis not present

## 2020-10-01 DIAGNOSIS — D630 Anemia in neoplastic disease: Secondary | ICD-10-CM | POA: Diagnosis not present

## 2020-10-03 DIAGNOSIS — C7951 Secondary malignant neoplasm of bone: Secondary | ICD-10-CM | POA: Diagnosis not present

## 2020-10-03 DIAGNOSIS — R112 Nausea with vomiting, unspecified: Secondary | ICD-10-CM | POA: Diagnosis not present

## 2020-10-03 DIAGNOSIS — N1330 Unspecified hydronephrosis: Secondary | ICD-10-CM | POA: Diagnosis not present

## 2020-10-03 DIAGNOSIS — E119 Type 2 diabetes mellitus without complications: Secondary | ICD-10-CM | POA: Diagnosis not present

## 2020-10-03 DIAGNOSIS — D630 Anemia in neoplastic disease: Secondary | ICD-10-CM | POA: Diagnosis not present

## 2020-10-03 DIAGNOSIS — C9000 Multiple myeloma not having achieved remission: Secondary | ICD-10-CM | POA: Diagnosis not present

## 2020-10-04 DIAGNOSIS — R112 Nausea with vomiting, unspecified: Secondary | ICD-10-CM | POA: Diagnosis not present

## 2020-10-04 DIAGNOSIS — E119 Type 2 diabetes mellitus without complications: Secondary | ICD-10-CM | POA: Diagnosis not present

## 2020-10-04 DIAGNOSIS — N1330 Unspecified hydronephrosis: Secondary | ICD-10-CM | POA: Diagnosis not present

## 2020-10-04 DIAGNOSIS — C9000 Multiple myeloma not having achieved remission: Secondary | ICD-10-CM | POA: Diagnosis not present

## 2020-10-04 DIAGNOSIS — D630 Anemia in neoplastic disease: Secondary | ICD-10-CM | POA: Diagnosis not present

## 2020-10-04 DIAGNOSIS — C7951 Secondary malignant neoplasm of bone: Secondary | ICD-10-CM | POA: Diagnosis not present

## 2020-10-05 DIAGNOSIS — N1330 Unspecified hydronephrosis: Secondary | ICD-10-CM | POA: Diagnosis not present

## 2020-10-05 DIAGNOSIS — C9000 Multiple myeloma not having achieved remission: Secondary | ICD-10-CM | POA: Diagnosis not present

## 2020-10-05 DIAGNOSIS — C7951 Secondary malignant neoplasm of bone: Secondary | ICD-10-CM | POA: Diagnosis not present

## 2020-10-05 DIAGNOSIS — E119 Type 2 diabetes mellitus without complications: Secondary | ICD-10-CM | POA: Diagnosis not present

## 2020-10-05 DIAGNOSIS — D630 Anemia in neoplastic disease: Secondary | ICD-10-CM | POA: Diagnosis not present

## 2020-10-05 DIAGNOSIS — R112 Nausea with vomiting, unspecified: Secondary | ICD-10-CM | POA: Diagnosis not present

## 2020-10-08 DIAGNOSIS — R112 Nausea with vomiting, unspecified: Secondary | ICD-10-CM | POA: Diagnosis not present

## 2020-10-08 DIAGNOSIS — C9000 Multiple myeloma not having achieved remission: Secondary | ICD-10-CM | POA: Diagnosis not present

## 2020-10-08 DIAGNOSIS — E119 Type 2 diabetes mellitus without complications: Secondary | ICD-10-CM | POA: Diagnosis not present

## 2020-10-08 DIAGNOSIS — C7951 Secondary malignant neoplasm of bone: Secondary | ICD-10-CM | POA: Diagnosis not present

## 2020-10-08 DIAGNOSIS — D630 Anemia in neoplastic disease: Secondary | ICD-10-CM | POA: Diagnosis not present

## 2020-10-08 DIAGNOSIS — N1330 Unspecified hydronephrosis: Secondary | ICD-10-CM | POA: Diagnosis not present

## 2020-10-10 DIAGNOSIS — C7951 Secondary malignant neoplasm of bone: Secondary | ICD-10-CM | POA: Diagnosis not present

## 2020-10-10 DIAGNOSIS — C9000 Multiple myeloma not having achieved remission: Secondary | ICD-10-CM | POA: Diagnosis not present

## 2020-10-10 DIAGNOSIS — D630 Anemia in neoplastic disease: Secondary | ICD-10-CM | POA: Diagnosis not present

## 2020-10-10 DIAGNOSIS — E119 Type 2 diabetes mellitus without complications: Secondary | ICD-10-CM | POA: Diagnosis not present

## 2020-10-10 DIAGNOSIS — R112 Nausea with vomiting, unspecified: Secondary | ICD-10-CM | POA: Diagnosis not present

## 2020-10-10 DIAGNOSIS — N1330 Unspecified hydronephrosis: Secondary | ICD-10-CM | POA: Diagnosis not present

## 2020-10-11 DIAGNOSIS — D630 Anemia in neoplastic disease: Secondary | ICD-10-CM | POA: Diagnosis not present

## 2020-10-11 DIAGNOSIS — C7951 Secondary malignant neoplasm of bone: Secondary | ICD-10-CM | POA: Diagnosis not present

## 2020-10-11 DIAGNOSIS — C9000 Multiple myeloma not having achieved remission: Secondary | ICD-10-CM | POA: Diagnosis not present

## 2020-10-11 DIAGNOSIS — N1330 Unspecified hydronephrosis: Secondary | ICD-10-CM | POA: Diagnosis not present

## 2020-10-11 DIAGNOSIS — E119 Type 2 diabetes mellitus without complications: Secondary | ICD-10-CM | POA: Diagnosis not present

## 2020-10-11 DIAGNOSIS — R112 Nausea with vomiting, unspecified: Secondary | ICD-10-CM | POA: Diagnosis not present

## 2020-10-12 DIAGNOSIS — C7951 Secondary malignant neoplasm of bone: Secondary | ICD-10-CM | POA: Diagnosis not present

## 2020-10-12 DIAGNOSIS — R112 Nausea with vomiting, unspecified: Secondary | ICD-10-CM | POA: Diagnosis not present

## 2020-10-12 DIAGNOSIS — N1330 Unspecified hydronephrosis: Secondary | ICD-10-CM | POA: Diagnosis not present

## 2020-10-12 DIAGNOSIS — D630 Anemia in neoplastic disease: Secondary | ICD-10-CM | POA: Diagnosis not present

## 2020-10-12 DIAGNOSIS — C9000 Multiple myeloma not having achieved remission: Secondary | ICD-10-CM | POA: Diagnosis not present

## 2020-10-12 DIAGNOSIS — E119 Type 2 diabetes mellitus without complications: Secondary | ICD-10-CM | POA: Diagnosis not present

## 2020-10-15 DIAGNOSIS — D630 Anemia in neoplastic disease: Secondary | ICD-10-CM | POA: Diagnosis not present

## 2020-10-15 DIAGNOSIS — C7951 Secondary malignant neoplasm of bone: Secondary | ICD-10-CM | POA: Diagnosis not present

## 2020-10-15 DIAGNOSIS — E119 Type 2 diabetes mellitus without complications: Secondary | ICD-10-CM | POA: Diagnosis not present

## 2020-10-15 DIAGNOSIS — C9000 Multiple myeloma not having achieved remission: Secondary | ICD-10-CM | POA: Diagnosis not present

## 2020-10-15 DIAGNOSIS — R112 Nausea with vomiting, unspecified: Secondary | ICD-10-CM | POA: Diagnosis not present

## 2020-10-15 DIAGNOSIS — N1330 Unspecified hydronephrosis: Secondary | ICD-10-CM | POA: Diagnosis not present

## 2020-10-17 ENCOUNTER — Other Ambulatory Visit: Payer: Self-pay | Admitting: *Deleted

## 2020-10-17 DIAGNOSIS — D630 Anemia in neoplastic disease: Secondary | ICD-10-CM | POA: Diagnosis not present

## 2020-10-17 DIAGNOSIS — D61818 Other pancytopenia: Secondary | ICD-10-CM

## 2020-10-17 DIAGNOSIS — C9000 Multiple myeloma not having achieved remission: Secondary | ICD-10-CM | POA: Diagnosis not present

## 2020-10-17 DIAGNOSIS — R112 Nausea with vomiting, unspecified: Secondary | ICD-10-CM | POA: Diagnosis not present

## 2020-10-17 DIAGNOSIS — N1330 Unspecified hydronephrosis: Secondary | ICD-10-CM | POA: Diagnosis not present

## 2020-10-17 DIAGNOSIS — G893 Neoplasm related pain (acute) (chronic): Secondary | ICD-10-CM

## 2020-10-17 DIAGNOSIS — E119 Type 2 diabetes mellitus without complications: Secondary | ICD-10-CM | POA: Diagnosis not present

## 2020-10-17 DIAGNOSIS — C7951 Secondary malignant neoplasm of bone: Secondary | ICD-10-CM | POA: Diagnosis not present

## 2020-10-17 MED ORDER — FENTANYL 75 MCG/HR TD PT72: 1.0000 | MEDICATED_PATCH | TRANSDERMAL | 0 refills | Status: DC

## 2020-10-19 DIAGNOSIS — C9000 Multiple myeloma not having achieved remission: Secondary | ICD-10-CM | POA: Diagnosis not present

## 2020-10-19 DIAGNOSIS — C7951 Secondary malignant neoplasm of bone: Secondary | ICD-10-CM | POA: Diagnosis not present

## 2020-10-19 DIAGNOSIS — D630 Anemia in neoplastic disease: Secondary | ICD-10-CM | POA: Diagnosis not present

## 2020-10-19 DIAGNOSIS — N1330 Unspecified hydronephrosis: Secondary | ICD-10-CM | POA: Diagnosis not present

## 2020-10-19 DIAGNOSIS — E119 Type 2 diabetes mellitus without complications: Secondary | ICD-10-CM | POA: Diagnosis not present

## 2020-10-19 DIAGNOSIS — R112 Nausea with vomiting, unspecified: Secondary | ICD-10-CM | POA: Diagnosis not present

## 2020-10-22 DIAGNOSIS — H409 Unspecified glaucoma: Secondary | ICD-10-CM | POA: Diagnosis not present

## 2020-10-22 DIAGNOSIS — D630 Anemia in neoplastic disease: Secondary | ICD-10-CM | POA: Diagnosis not present

## 2020-10-22 DIAGNOSIS — E119 Type 2 diabetes mellitus without complications: Secondary | ICD-10-CM | POA: Diagnosis not present

## 2020-10-22 DIAGNOSIS — K219 Gastro-esophageal reflux disease without esophagitis: Secondary | ICD-10-CM | POA: Diagnosis not present

## 2020-10-22 DIAGNOSIS — C7951 Secondary malignant neoplasm of bone: Secondary | ICD-10-CM | POA: Diagnosis not present

## 2020-10-22 DIAGNOSIS — M5126 Other intervertebral disc displacement, lumbar region: Secondary | ICD-10-CM | POA: Diagnosis not present

## 2020-10-22 DIAGNOSIS — C9000 Multiple myeloma not having achieved remission: Secondary | ICD-10-CM | POA: Diagnosis not present

## 2020-10-22 DIAGNOSIS — N1330 Unspecified hydronephrosis: Secondary | ICD-10-CM | POA: Diagnosis not present

## 2020-10-22 DIAGNOSIS — R112 Nausea with vomiting, unspecified: Secondary | ICD-10-CM | POA: Diagnosis not present

## 2020-10-24 DIAGNOSIS — C9000 Multiple myeloma not having achieved remission: Secondary | ICD-10-CM | POA: Diagnosis not present

## 2020-10-24 DIAGNOSIS — N1330 Unspecified hydronephrosis: Secondary | ICD-10-CM | POA: Diagnosis not present

## 2020-10-24 DIAGNOSIS — E119 Type 2 diabetes mellitus without complications: Secondary | ICD-10-CM | POA: Diagnosis not present

## 2020-10-24 DIAGNOSIS — D630 Anemia in neoplastic disease: Secondary | ICD-10-CM | POA: Diagnosis not present

## 2020-10-24 DIAGNOSIS — R112 Nausea with vomiting, unspecified: Secondary | ICD-10-CM | POA: Diagnosis not present

## 2020-10-24 DIAGNOSIS — C7951 Secondary malignant neoplasm of bone: Secondary | ICD-10-CM | POA: Diagnosis not present

## 2020-10-25 IMAGING — CT CT ANGIO CHEST
3 of 8 series · 18 of 46 positions shown · IV contrast (OMNIPAQUE)
Comparison: July 03, 2020.

CLINICAL DATA: Chest pain.

EXAM:
CT ANGIOGRAPHY CHEST WITH CONTRAST
TECHNIQUE: Multidetector CT imaging of the chest was performed using the
standard protocol during bolus administration of intravenous
contrast. Multiplanar CT image reconstructions and MIPs were
obtained to evaluate the vascular anatomy.
CONTRAST:  100mL OMNIPAQUE IOHEXOL 350 MG/ML SOLN

[Series 5: axial arterial · axial · arterial · 0.65mm/px · z∈[-254,-29]mm · 9 of 95 slices shown]
[im 10/95  lung]
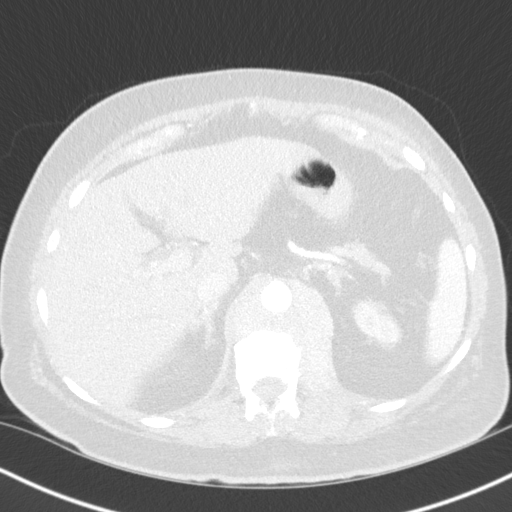
[im 19/95  soft-tissue]
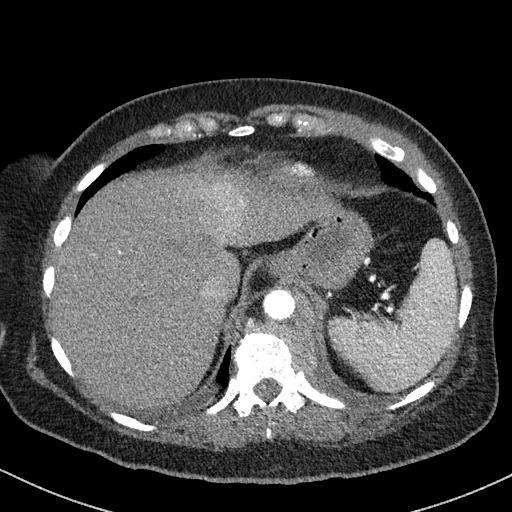
[im 29/95  lung]
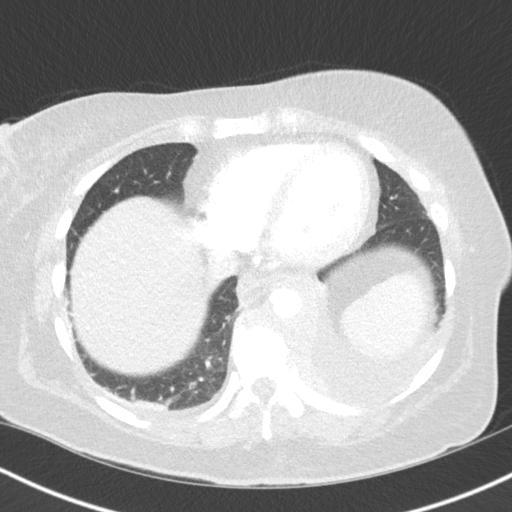
[im 38/95  soft-tissue]
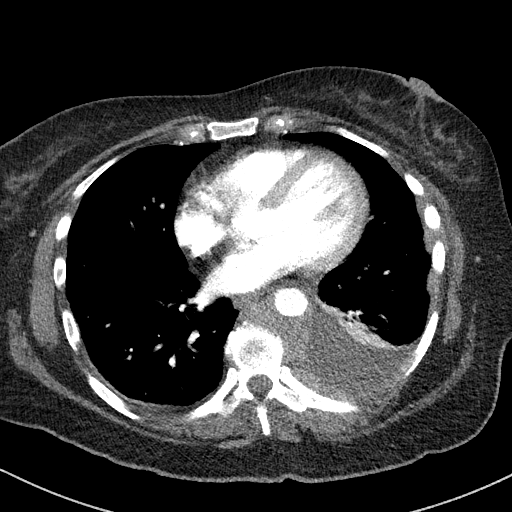
[im 48/95  lung]
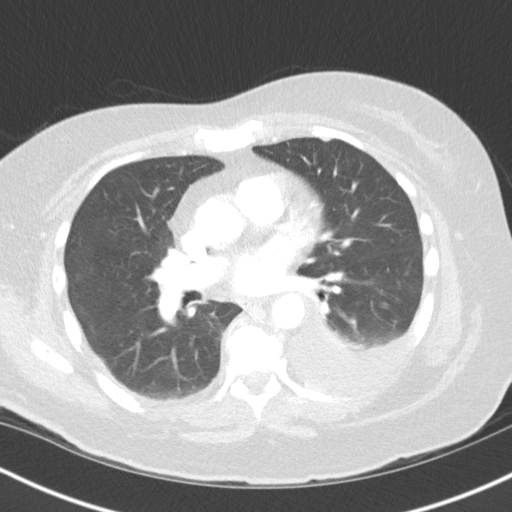
[im 57/95  soft-tissue]
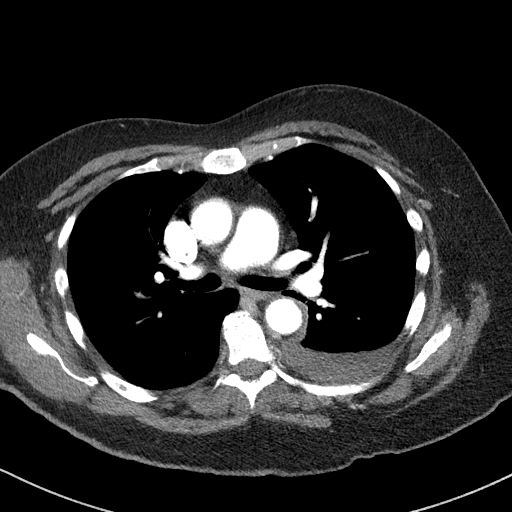
[im 66/95  lung]
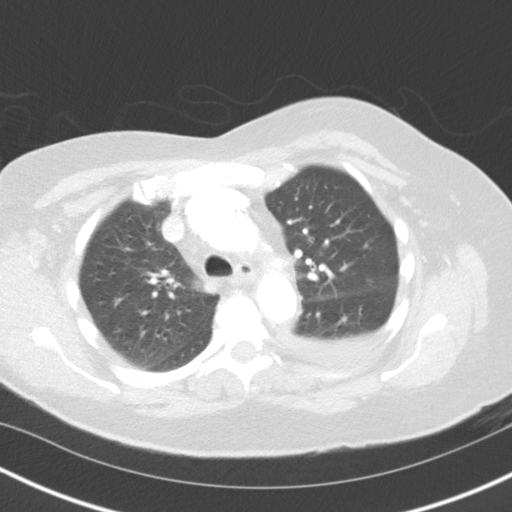
[im 76/95  soft-tissue]
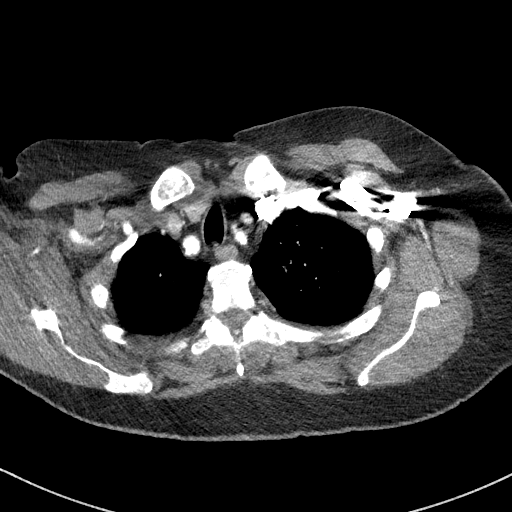
[im 85/95  lung]
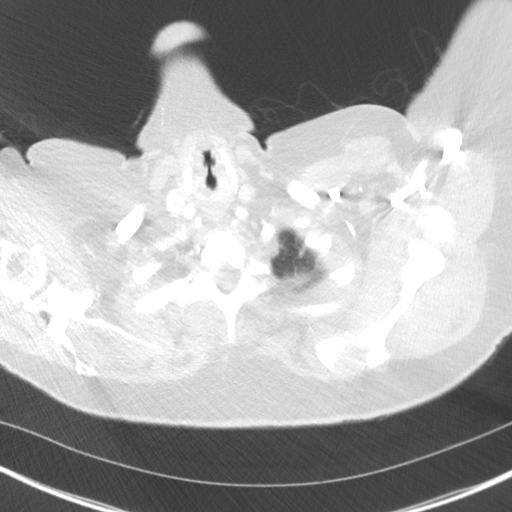

[Series 7: lung · axial · 0.65mm/px · z∈[-265,-89]mm · 6 of 142 slices shown]
[im 9/142  soft-tissue]
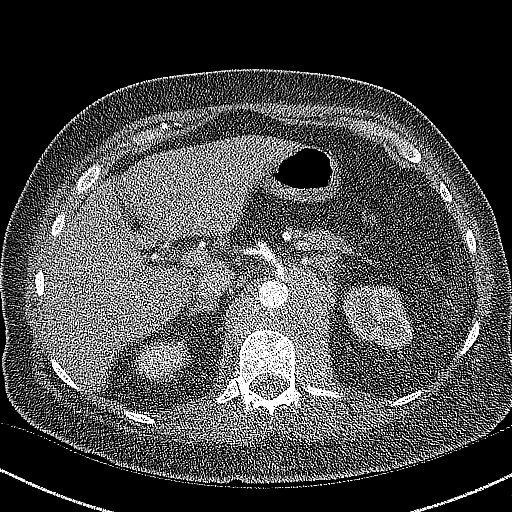
[im 27/142  soft-tissue]
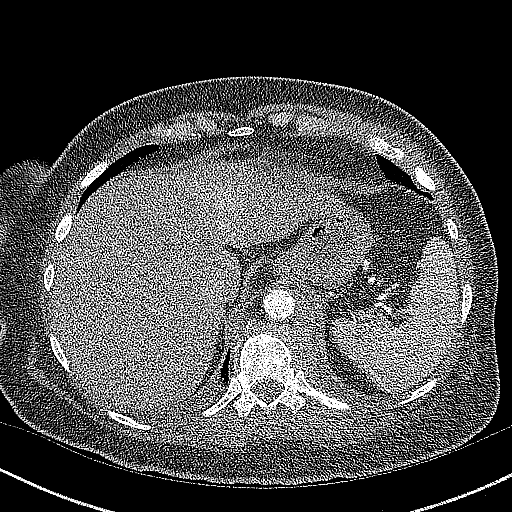
[im 45/142  soft-tissue]
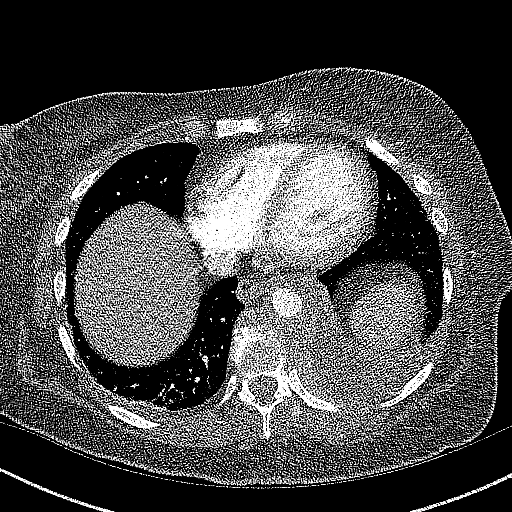
[im 62/142  soft-tissue]
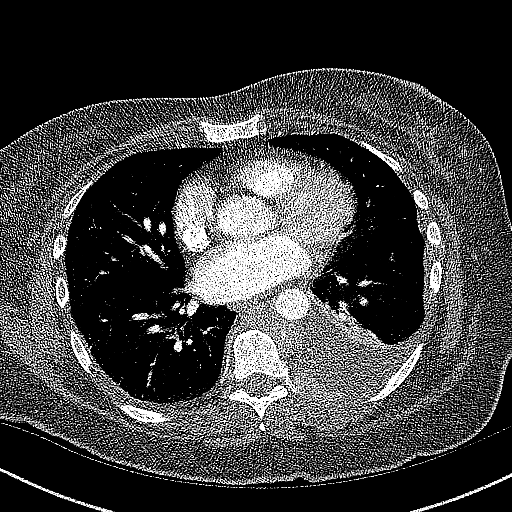
[im 80/142  soft-tissue]
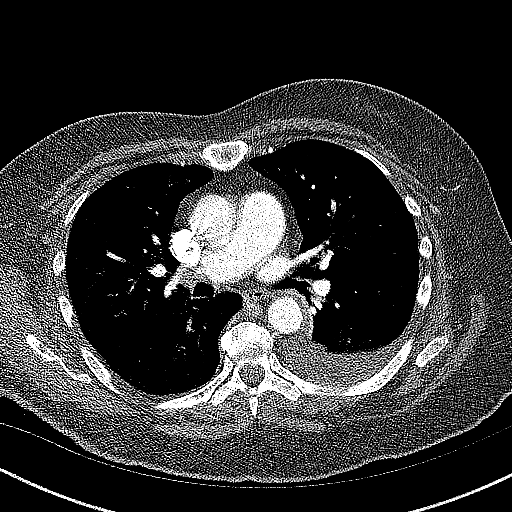
[im 97/142  soft-tissue]
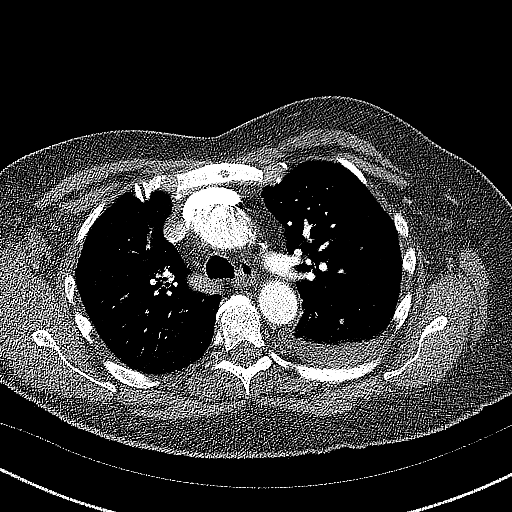

[Series 8: coronals · coronal · 0.59mm/px · 3 of 129 slices shown]
[im 33/129  soft-tissue]
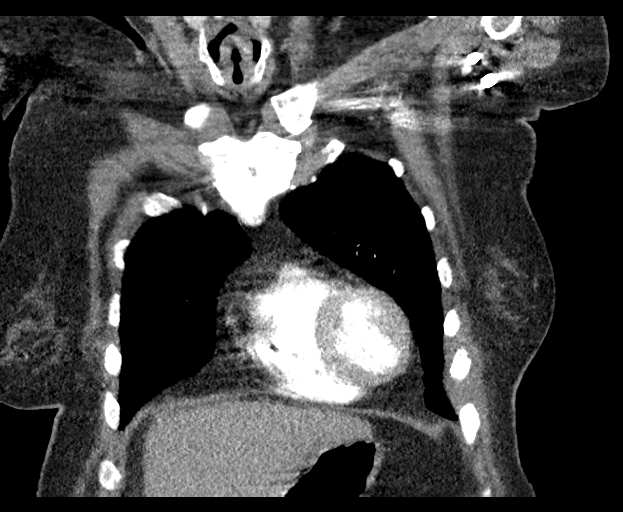
[im 65/129  soft-tissue]
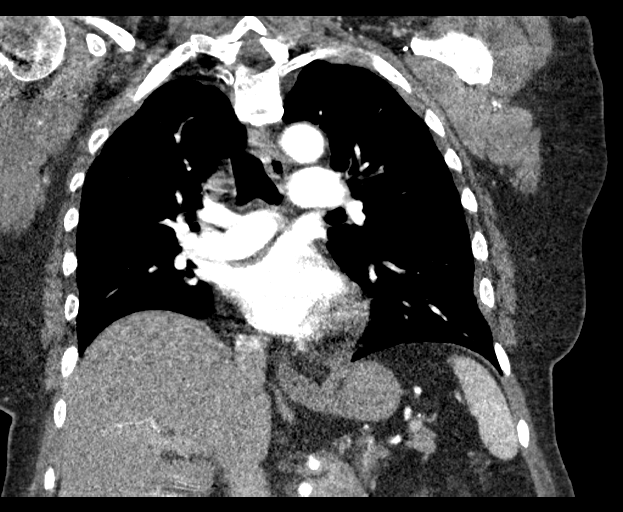
[im 97/129  soft-tissue]
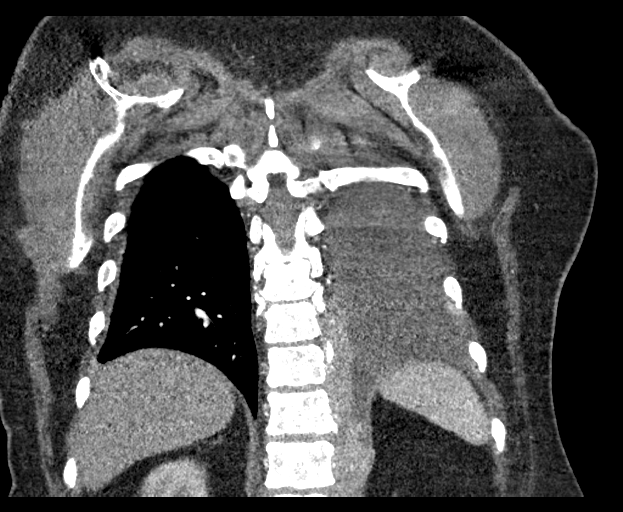

[18 of 46 positions shown; findings below may reference images not displayed]

FINDINGS: Cardiovascular: Preferential opacification of the thoracic aorta. No
evidence of thoracic aortic aneurysm or dissection. Normal heart
size. No pericardial effusion.

Mediastinum/Nodes: Thyroid gland is unremarkable. Esophagus is
unremarkable. As noted on prior CT, there is extensive adenopathy
seen in the posterior mediastinum surrounding a large portion of the
distal descending thoracic aorta. This adenopathy extends into the
retroperitoneal and left retrocrural regions, were continues to
surround a large portion of the aorta.

Lungs/Pleura: No pneumothorax is noted. Right lung is clear.
Moderate left pleural effusion is noted with adjacent subsegmental
atelectasis of left lower lobe.

Upper Abdomen: Extensive retroperitoneal adenopathy in visualized
portion of abdomen as described above.

Musculoskeletal: Multiple ill-defined sclerotic densities are noted
throughout the thoracic spine and ribs consistent with history of
multiple myeloma. No acute fracture is noted.

Review of the MIP images confirms the above findings.
IMPRESSION: 1. No evidence of thoracic aortic dissection or aneurysm.
2. Extensive adenopathy is noted in the posterior mediastinum
surrounding a large portion of the distal descending thoracic aorta.
This adenopathy extends into the retroperitoneal and left
retrocrural regions, where it continues to surround a large portion
of the aorta. This is consistent with history of multiple myeloma.
3. Moderate left pleural effusion is noted with adjacent
subsegmental atelectasis of left lower lobe.
4. Multiple ill-defined sclerotic densities are noted throughout the
thoracic spine and ribs consistent with history of multiple myeloma.

## 2020-10-26 DIAGNOSIS — R112 Nausea with vomiting, unspecified: Secondary | ICD-10-CM | POA: Diagnosis not present

## 2020-10-26 DIAGNOSIS — E119 Type 2 diabetes mellitus without complications: Secondary | ICD-10-CM | POA: Diagnosis not present

## 2020-10-26 DIAGNOSIS — C9000 Multiple myeloma not having achieved remission: Secondary | ICD-10-CM | POA: Diagnosis not present

## 2020-10-26 DIAGNOSIS — C7951 Secondary malignant neoplasm of bone: Secondary | ICD-10-CM | POA: Diagnosis not present

## 2020-10-26 DIAGNOSIS — N1330 Unspecified hydronephrosis: Secondary | ICD-10-CM | POA: Diagnosis not present

## 2020-10-26 DIAGNOSIS — D630 Anemia in neoplastic disease: Secondary | ICD-10-CM | POA: Diagnosis not present

## 2020-10-29 DIAGNOSIS — C7951 Secondary malignant neoplasm of bone: Secondary | ICD-10-CM | POA: Diagnosis not present

## 2020-10-29 DIAGNOSIS — D630 Anemia in neoplastic disease: Secondary | ICD-10-CM | POA: Diagnosis not present

## 2020-10-29 DIAGNOSIS — C9000 Multiple myeloma not having achieved remission: Secondary | ICD-10-CM | POA: Diagnosis not present

## 2020-10-29 DIAGNOSIS — N1330 Unspecified hydronephrosis: Secondary | ICD-10-CM | POA: Diagnosis not present

## 2020-10-29 DIAGNOSIS — R112 Nausea with vomiting, unspecified: Secondary | ICD-10-CM | POA: Diagnosis not present

## 2020-10-29 DIAGNOSIS — E119 Type 2 diabetes mellitus without complications: Secondary | ICD-10-CM | POA: Diagnosis not present

## 2020-10-31 DIAGNOSIS — D630 Anemia in neoplastic disease: Secondary | ICD-10-CM | POA: Diagnosis not present

## 2020-10-31 DIAGNOSIS — E119 Type 2 diabetes mellitus without complications: Secondary | ICD-10-CM | POA: Diagnosis not present

## 2020-10-31 DIAGNOSIS — C7951 Secondary malignant neoplasm of bone: Secondary | ICD-10-CM | POA: Diagnosis not present

## 2020-10-31 DIAGNOSIS — C9000 Multiple myeloma not having achieved remission: Secondary | ICD-10-CM | POA: Diagnosis not present

## 2020-10-31 DIAGNOSIS — R112 Nausea with vomiting, unspecified: Secondary | ICD-10-CM | POA: Diagnosis not present

## 2020-10-31 DIAGNOSIS — N1330 Unspecified hydronephrosis: Secondary | ICD-10-CM | POA: Diagnosis not present

## 2020-11-02 DIAGNOSIS — R112 Nausea with vomiting, unspecified: Secondary | ICD-10-CM | POA: Diagnosis not present

## 2020-11-02 DIAGNOSIS — C7951 Secondary malignant neoplasm of bone: Secondary | ICD-10-CM | POA: Diagnosis not present

## 2020-11-02 DIAGNOSIS — C9000 Multiple myeloma not having achieved remission: Secondary | ICD-10-CM | POA: Diagnosis not present

## 2020-11-02 DIAGNOSIS — D630 Anemia in neoplastic disease: Secondary | ICD-10-CM | POA: Diagnosis not present

## 2020-11-02 DIAGNOSIS — E119 Type 2 diabetes mellitus without complications: Secondary | ICD-10-CM | POA: Diagnosis not present

## 2020-11-02 DIAGNOSIS — N1330 Unspecified hydronephrosis: Secondary | ICD-10-CM | POA: Diagnosis not present

## 2020-11-05 ENCOUNTER — Other Ambulatory Visit: Payer: Self-pay | Admitting: *Deleted

## 2020-11-05 DIAGNOSIS — G893 Neoplasm related pain (acute) (chronic): Secondary | ICD-10-CM

## 2020-11-05 DIAGNOSIS — C9000 Multiple myeloma not having achieved remission: Secondary | ICD-10-CM

## 2020-11-05 DIAGNOSIS — D61818 Other pancytopenia: Secondary | ICD-10-CM

## 2020-11-05 MED ORDER — FENTANYL 75 MCG/HR TD PT72: 1.0000 | MEDICATED_PATCH | TRANSDERMAL | 0 refills | Status: DC

## 2020-11-05 MED ORDER — FENTANYL 75 MCG/HR TD PT72: 1.0000 | MEDICATED_PATCH | TRANSDERMAL | 0 refills | Status: AC

## 2020-11-05 NOTE — Telephone Encounter (Signed)
Hospice RN called with request for refill on fentanyl 75mg   patch for pt.

## 2020-11-06 ENCOUNTER — Telehealth: Payer: Self-pay | Admitting: *Deleted

## 2020-11-06 DIAGNOSIS — N1330 Unspecified hydronephrosis: Secondary | ICD-10-CM | POA: Diagnosis not present

## 2020-11-06 DIAGNOSIS — E119 Type 2 diabetes mellitus without complications: Secondary | ICD-10-CM | POA: Diagnosis not present

## 2020-11-06 DIAGNOSIS — D630 Anemia in neoplastic disease: Secondary | ICD-10-CM | POA: Diagnosis not present

## 2020-11-06 DIAGNOSIS — R112 Nausea with vomiting, unspecified: Secondary | ICD-10-CM | POA: Diagnosis not present

## 2020-11-06 DIAGNOSIS — C9000 Multiple myeloma not having achieved remission: Secondary | ICD-10-CM | POA: Diagnosis not present

## 2020-11-06 DIAGNOSIS — C7951 Secondary malignant neoplasm of bone: Secondary | ICD-10-CM | POA: Diagnosis not present

## 2020-11-06 NOTE — Telephone Encounter (Signed)
Call placed to patient's husband to check on patient's status per order of Dr. [name].  Pt.'s husband states that pt.'s pain is under control, she is becoming increasingly weak and not eating, sipping water only.  Hospice has been a huge help for pt, per pt.'s husband.  Pt.'s husband is appreciative of call and verbalizes an understanding to call this office if any assistance is needed for patient.

## 2020-11-07 DIAGNOSIS — C9000 Multiple myeloma not having achieved remission: Secondary | ICD-10-CM | POA: Diagnosis not present

## 2020-11-07 DIAGNOSIS — E119 Type 2 diabetes mellitus without complications: Secondary | ICD-10-CM | POA: Diagnosis not present

## 2020-11-07 DIAGNOSIS — N1330 Unspecified hydronephrosis: Secondary | ICD-10-CM | POA: Diagnosis not present

## 2020-11-07 DIAGNOSIS — R112 Nausea with vomiting, unspecified: Secondary | ICD-10-CM | POA: Diagnosis not present

## 2020-11-07 DIAGNOSIS — C7951 Secondary malignant neoplasm of bone: Secondary | ICD-10-CM | POA: Diagnosis not present

## 2020-11-07 DIAGNOSIS — D630 Anemia in neoplastic disease: Secondary | ICD-10-CM | POA: Diagnosis not present

## 2020-11-09 DIAGNOSIS — N1330 Unspecified hydronephrosis: Secondary | ICD-10-CM | POA: Diagnosis not present

## 2020-11-09 DIAGNOSIS — E119 Type 2 diabetes mellitus without complications: Secondary | ICD-10-CM | POA: Diagnosis not present

## 2020-11-09 DIAGNOSIS — C9000 Multiple myeloma not having achieved remission: Secondary | ICD-10-CM | POA: Diagnosis not present

## 2020-11-09 DIAGNOSIS — C7951 Secondary malignant neoplasm of bone: Secondary | ICD-10-CM | POA: Diagnosis not present

## 2020-11-09 DIAGNOSIS — R112 Nausea with vomiting, unspecified: Secondary | ICD-10-CM | POA: Diagnosis not present

## 2020-11-09 DIAGNOSIS — D630 Anemia in neoplastic disease: Secondary | ICD-10-CM | POA: Diagnosis not present

## 2020-11-12 DIAGNOSIS — R112 Nausea with vomiting, unspecified: Secondary | ICD-10-CM | POA: Diagnosis not present

## 2020-11-12 DIAGNOSIS — C9000 Multiple myeloma not having achieved remission: Secondary | ICD-10-CM | POA: Diagnosis not present

## 2020-11-12 DIAGNOSIS — N1330 Unspecified hydronephrosis: Secondary | ICD-10-CM | POA: Diagnosis not present

## 2020-11-12 DIAGNOSIS — D630 Anemia in neoplastic disease: Secondary | ICD-10-CM | POA: Diagnosis not present

## 2020-11-12 DIAGNOSIS — E119 Type 2 diabetes mellitus without complications: Secondary | ICD-10-CM | POA: Diagnosis not present

## 2020-11-12 DIAGNOSIS — C7951 Secondary malignant neoplasm of bone: Secondary | ICD-10-CM | POA: Diagnosis not present

## 2020-11-14 DIAGNOSIS — N1330 Unspecified hydronephrosis: Secondary | ICD-10-CM | POA: Diagnosis not present

## 2020-11-14 DIAGNOSIS — C7951 Secondary malignant neoplasm of bone: Secondary | ICD-10-CM | POA: Diagnosis not present

## 2020-11-14 DIAGNOSIS — C9000 Multiple myeloma not having achieved remission: Secondary | ICD-10-CM | POA: Diagnosis not present

## 2020-11-14 DIAGNOSIS — E119 Type 2 diabetes mellitus without complications: Secondary | ICD-10-CM | POA: Diagnosis not present

## 2020-11-14 DIAGNOSIS — R112 Nausea with vomiting, unspecified: Secondary | ICD-10-CM | POA: Diagnosis not present

## 2020-11-14 DIAGNOSIS — D630 Anemia in neoplastic disease: Secondary | ICD-10-CM | POA: Diagnosis not present

## 2020-11-16 DIAGNOSIS — R112 Nausea with vomiting, unspecified: Secondary | ICD-10-CM | POA: Diagnosis not present

## 2020-11-16 DIAGNOSIS — C7951 Secondary malignant neoplasm of bone: Secondary | ICD-10-CM | POA: Diagnosis not present

## 2020-11-16 DIAGNOSIS — E119 Type 2 diabetes mellitus without complications: Secondary | ICD-10-CM | POA: Diagnosis not present

## 2020-11-16 DIAGNOSIS — N1330 Unspecified hydronephrosis: Secondary | ICD-10-CM | POA: Diagnosis not present

## 2020-11-16 DIAGNOSIS — C9000 Multiple myeloma not having achieved remission: Secondary | ICD-10-CM | POA: Diagnosis not present

## 2020-11-16 DIAGNOSIS — D630 Anemia in neoplastic disease: Secondary | ICD-10-CM | POA: Diagnosis not present

## 2020-11-19 DIAGNOSIS — E119 Type 2 diabetes mellitus without complications: Secondary | ICD-10-CM | POA: Diagnosis not present

## 2020-11-19 DIAGNOSIS — D630 Anemia in neoplastic disease: Secondary | ICD-10-CM | POA: Diagnosis not present

## 2020-11-19 DIAGNOSIS — C9000 Multiple myeloma not having achieved remission: Secondary | ICD-10-CM | POA: Diagnosis not present

## 2020-11-19 DIAGNOSIS — R112 Nausea with vomiting, unspecified: Secondary | ICD-10-CM | POA: Diagnosis not present

## 2020-11-19 DIAGNOSIS — N1330 Unspecified hydronephrosis: Secondary | ICD-10-CM | POA: Diagnosis not present

## 2020-11-19 DIAGNOSIS — C7951 Secondary malignant neoplasm of bone: Secondary | ICD-10-CM | POA: Diagnosis not present

## 2020-11-21 DIAGNOSIS — M5126 Other intervertebral disc displacement, lumbar region: Secondary | ICD-10-CM | POA: Diagnosis not present

## 2020-11-21 DIAGNOSIS — E119 Type 2 diabetes mellitus without complications: Secondary | ICD-10-CM | POA: Diagnosis not present

## 2020-11-21 DIAGNOSIS — C9000 Multiple myeloma not having achieved remission: Secondary | ICD-10-CM | POA: Diagnosis not present

## 2020-11-21 DIAGNOSIS — D630 Anemia in neoplastic disease: Secondary | ICD-10-CM | POA: Diagnosis not present

## 2020-11-21 DIAGNOSIS — H409 Unspecified glaucoma: Secondary | ICD-10-CM | POA: Diagnosis not present

## 2020-11-21 DIAGNOSIS — R112 Nausea with vomiting, unspecified: Secondary | ICD-10-CM | POA: Diagnosis not present

## 2020-11-21 DIAGNOSIS — C7951 Secondary malignant neoplasm of bone: Secondary | ICD-10-CM | POA: Diagnosis not present

## 2020-11-21 DIAGNOSIS — N1330 Unspecified hydronephrosis: Secondary | ICD-10-CM | POA: Diagnosis not present

## 2020-11-21 DIAGNOSIS — K219 Gastro-esophageal reflux disease without esophagitis: Secondary | ICD-10-CM | POA: Diagnosis not present

## 2020-11-22 DIAGNOSIS — E119 Type 2 diabetes mellitus without complications: Secondary | ICD-10-CM | POA: Diagnosis not present

## 2020-11-22 DIAGNOSIS — C7951 Secondary malignant neoplasm of bone: Secondary | ICD-10-CM | POA: Diagnosis not present

## 2020-11-22 DIAGNOSIS — D630 Anemia in neoplastic disease: Secondary | ICD-10-CM | POA: Diagnosis not present

## 2020-11-22 DIAGNOSIS — N1330 Unspecified hydronephrosis: Secondary | ICD-10-CM | POA: Diagnosis not present

## 2020-11-22 DIAGNOSIS — C9000 Multiple myeloma not having achieved remission: Secondary | ICD-10-CM | POA: Diagnosis not present

## 2020-11-22 DIAGNOSIS — R112 Nausea with vomiting, unspecified: Secondary | ICD-10-CM | POA: Diagnosis not present

## 2020-11-23 ENCOUNTER — Telehealth: Payer: Self-pay

## 2020-11-23 DIAGNOSIS — E119 Type 2 diabetes mellitus without complications: Secondary | ICD-10-CM | POA: Diagnosis not present

## 2020-11-23 DIAGNOSIS — D630 Anemia in neoplastic disease: Secondary | ICD-10-CM | POA: Diagnosis not present

## 2020-11-23 DIAGNOSIS — C9000 Multiple myeloma not having achieved remission: Secondary | ICD-10-CM | POA: Diagnosis not present

## 2020-11-23 DIAGNOSIS — N1330 Unspecified hydronephrosis: Secondary | ICD-10-CM | POA: Diagnosis not present

## 2020-11-23 DIAGNOSIS — R112 Nausea with vomiting, unspecified: Secondary | ICD-10-CM | POA: Diagnosis not present

## 2020-11-23 DIAGNOSIS — C7951 Secondary malignant neoplasm of bone: Secondary | ICD-10-CM | POA: Diagnosis not present

## 2020-12-22 NOTE — Telephone Encounter (Signed)
Received fax notification from [facility] that pt passed away today. No time provided. Dr [name] aware. Flowers sent to spouse. dph

## 2020-12-22 DEATH — deceased

## 2021-03-21 ENCOUNTER — Other Ambulatory Visit (HOSPITAL_COMMUNITY): Payer: Self-pay
